# Patient Record
Sex: Male | Born: 1935
Health system: Southern US, Community
[De-identification: ages and names within clinical notes are randomized; demographics above are authoritative.]

## PROBLEM LIST (undated history)

## (undated) DIAGNOSIS — G250 Essential tremor: Secondary | ICD-10-CM

## (undated) DIAGNOSIS — N200 Calculus of kidney: Secondary | ICD-10-CM

## (undated) DIAGNOSIS — C801 Malignant (primary) neoplasm, unspecified: Secondary | ICD-10-CM

## (undated) DIAGNOSIS — E785 Hyperlipidemia, unspecified: Secondary | ICD-10-CM

## (undated) DIAGNOSIS — I10 Essential (primary) hypertension: Secondary | ICD-10-CM

## (undated) DIAGNOSIS — I252 Old myocardial infarction: Secondary | ICD-10-CM

## (undated) DIAGNOSIS — I6529 Occlusion and stenosis of unspecified carotid artery: Secondary | ICD-10-CM

## (undated) DIAGNOSIS — Z951 Presence of aortocoronary bypass graft: Secondary | ICD-10-CM

## (undated) DIAGNOSIS — M1990 Unspecified osteoarthritis, unspecified site: Secondary | ICD-10-CM

## (undated) DIAGNOSIS — I2510 Atherosclerotic heart disease of native coronary artery without angina pectoris: Secondary | ICD-10-CM

## (undated) HISTORY — DX: Occlusion and stenosis of unspecified carotid artery: I65.29

## (undated) HISTORY — DX: Essential (primary) hypertension: I10

## (undated) HISTORY — DX: Old myocardial infarction: I25.2

## (undated) HISTORY — DX: Hyperlipidemia, unspecified: E78.5

## (undated) HISTORY — DX: Calculus of kidney: N20.0

## (undated) HISTORY — DX: Essential tremor: G25.0

## (undated) HISTORY — DX: Presence of aortocoronary bypass graft: Z95.1

## (undated) HISTORY — PX: CORONARY STENT PLACEMENT: SHX1402

## (undated) HISTORY — DX: Atherosclerotic heart disease of native coronary artery without angina pectoris: I25.10

## (undated) HISTORY — DX: Unspecified osteoarthritis, unspecified site: M19.90

---

## 1984-07-19 HISTORY — PX: CORONARY ARTERY BYPASS GRAFT: SHX141

## 1984-08-29 DIAGNOSIS — Z951 Presence of aortocoronary bypass graft: Secondary | ICD-10-CM

## 1984-08-29 HISTORY — DX: Presence of aortocoronary bypass graft: Z95.1

## 1990-10-18 HISTORY — PX: CHOLECYSTECTOMY: SHX55

## 1999-01-16 ENCOUNTER — Observation Stay (HOSPITAL_COMMUNITY): Admission: AD | Admit: 1999-01-16 | Discharge: 1999-01-17 | Payer: Self-pay | Admitting: Cardiology

## 2002-01-18 ENCOUNTER — Emergency Department (HOSPITAL_COMMUNITY): Admission: EM | Admit: 2002-01-18 | Discharge: 2002-01-18 | Payer: Self-pay | Admitting: *Deleted

## 2005-12-04 ENCOUNTER — Inpatient Hospital Stay (HOSPITAL_COMMUNITY): Admission: EM | Admit: 2005-12-04 | Discharge: 2005-12-10 | Payer: Self-pay | Admitting: Emergency Medicine

## 2005-12-22 ENCOUNTER — Inpatient Hospital Stay (HOSPITAL_COMMUNITY): Admission: RE | Admit: 2005-12-22 | Discharge: 2005-12-24 | Payer: Self-pay | Admitting: Orthopedic Surgery

## 2008-12-27 HISTORY — PX: OTHER SURGICAL HISTORY: SHX169

## 2008-12-27 HISTORY — PX: US ECHOCARDIOGRAPHY: HXRAD669

## 2011-08-02 DIAGNOSIS — I10 Essential (primary) hypertension: Secondary | ICD-10-CM | POA: Diagnosis not present

## 2011-08-02 DIAGNOSIS — I2510 Atherosclerotic heart disease of native coronary artery without angina pectoris: Secondary | ICD-10-CM | POA: Diagnosis not present

## 2011-08-02 DIAGNOSIS — Z951 Presence of aortocoronary bypass graft: Secondary | ICD-10-CM | POA: Diagnosis not present

## 2011-08-04 DIAGNOSIS — M5020 Other cervical disc displacement, unspecified cervical region: Secondary | ICD-10-CM | POA: Diagnosis not present

## 2011-08-04 DIAGNOSIS — M542 Cervicalgia: Secondary | ICD-10-CM | POA: Diagnosis not present

## 2011-08-04 DIAGNOSIS — M25519 Pain in unspecified shoulder: Secondary | ICD-10-CM | POA: Diagnosis not present

## 2011-08-04 DIAGNOSIS — M79609 Pain in unspecified limb: Secondary | ICD-10-CM | POA: Diagnosis not present

## 2011-08-04 DIAGNOSIS — M5412 Radiculopathy, cervical region: Secondary | ICD-10-CM | POA: Diagnosis not present

## 2011-08-04 DIAGNOSIS — Z79899 Other long term (current) drug therapy: Secondary | ICD-10-CM | POA: Diagnosis not present

## 2011-08-04 DIAGNOSIS — M25539 Pain in unspecified wrist: Secondary | ICD-10-CM | POA: Diagnosis not present

## 2011-08-17 DIAGNOSIS — R059 Cough, unspecified: Secondary | ICD-10-CM | POA: Diagnosis not present

## 2011-08-17 DIAGNOSIS — R05 Cough: Secondary | ICD-10-CM | POA: Diagnosis not present

## 2011-08-17 DIAGNOSIS — J069 Acute upper respiratory infection, unspecified: Secondary | ICD-10-CM | POA: Diagnosis not present

## 2011-08-17 DIAGNOSIS — D681 Hereditary factor XI deficiency: Secondary | ICD-10-CM | POA: Diagnosis not present

## 2011-09-01 DIAGNOSIS — L570 Actinic keratosis: Secondary | ICD-10-CM | POA: Diagnosis not present

## 2011-09-02 DIAGNOSIS — E782 Mixed hyperlipidemia: Secondary | ICD-10-CM | POA: Diagnosis not present

## 2011-09-02 DIAGNOSIS — M542 Cervicalgia: Secondary | ICD-10-CM | POA: Diagnosis not present

## 2011-09-02 DIAGNOSIS — Z79899 Other long term (current) drug therapy: Secondary | ICD-10-CM | POA: Diagnosis not present

## 2011-09-02 DIAGNOSIS — M5412 Radiculopathy, cervical region: Secondary | ICD-10-CM | POA: Diagnosis not present

## 2011-10-04 DIAGNOSIS — G8929 Other chronic pain: Secondary | ICD-10-CM | POA: Diagnosis not present

## 2011-10-04 DIAGNOSIS — E039 Hypothyroidism, unspecified: Secondary | ICD-10-CM | POA: Diagnosis not present

## 2011-10-04 DIAGNOSIS — I25810 Atherosclerosis of coronary artery bypass graft(s) without angina pectoris: Secondary | ICD-10-CM | POA: Diagnosis not present

## 2011-10-04 DIAGNOSIS — Z79899 Other long term (current) drug therapy: Secondary | ICD-10-CM | POA: Diagnosis not present

## 2011-10-04 DIAGNOSIS — I10 Essential (primary) hypertension: Secondary | ICD-10-CM | POA: Diagnosis not present

## 2011-10-04 DIAGNOSIS — M542 Cervicalgia: Secondary | ICD-10-CM | POA: Diagnosis not present

## 2011-10-06 DIAGNOSIS — T148XXA Other injury of unspecified body region, initial encounter: Secondary | ICD-10-CM | POA: Diagnosis not present

## 2011-10-06 DIAGNOSIS — IMO0002 Reserved for concepts with insufficient information to code with codable children: Secondary | ICD-10-CM | POA: Diagnosis not present

## 2011-10-08 DIAGNOSIS — Z23 Encounter for immunization: Secondary | ICD-10-CM | POA: Diagnosis not present

## 2011-10-08 DIAGNOSIS — IMO0002 Reserved for concepts with insufficient information to code with codable children: Secondary | ICD-10-CM | POA: Diagnosis not present

## 2011-10-12 DIAGNOSIS — M542 Cervicalgia: Secondary | ICD-10-CM | POA: Diagnosis not present

## 2011-10-12 DIAGNOSIS — M5412 Radiculopathy, cervical region: Secondary | ICD-10-CM | POA: Diagnosis not present

## 2011-10-12 DIAGNOSIS — M5020 Other cervical disc displacement, unspecified cervical region: Secondary | ICD-10-CM | POA: Diagnosis not present

## 2011-10-15 DIAGNOSIS — IMO0002 Reserved for concepts with insufficient information to code with codable children: Secondary | ICD-10-CM | POA: Diagnosis not present

## 2011-10-20 DIAGNOSIS — M47812 Spondylosis without myelopathy or radiculopathy, cervical region: Secondary | ICD-10-CM | POA: Diagnosis not present

## 2011-10-20 DIAGNOSIS — Z79899 Other long term (current) drug therapy: Secondary | ICD-10-CM | POA: Diagnosis not present

## 2011-10-20 DIAGNOSIS — M5030 Other cervical disc degeneration, unspecified cervical region: Secondary | ICD-10-CM | POA: Diagnosis not present

## 2011-10-20 DIAGNOSIS — G894 Chronic pain syndrome: Secondary | ICD-10-CM | POA: Diagnosis not present

## 2011-10-20 DIAGNOSIS — M531 Cervicobrachial syndrome: Secondary | ICD-10-CM | POA: Diagnosis not present

## 2011-11-04 DIAGNOSIS — M542 Cervicalgia: Secondary | ICD-10-CM | POA: Diagnosis not present

## 2011-11-04 DIAGNOSIS — G5600 Carpal tunnel syndrome, unspecified upper limb: Secondary | ICD-10-CM | POA: Diagnosis not present

## 2011-11-30 DIAGNOSIS — M47812 Spondylosis without myelopathy or radiculopathy, cervical region: Secondary | ICD-10-CM | POA: Diagnosis not present

## 2011-11-30 DIAGNOSIS — G894 Chronic pain syndrome: Secondary | ICD-10-CM | POA: Diagnosis not present

## 2011-11-30 DIAGNOSIS — M5412 Radiculopathy, cervical region: Secondary | ICD-10-CM | POA: Diagnosis not present

## 2011-11-30 DIAGNOSIS — M5030 Other cervical disc degeneration, unspecified cervical region: Secondary | ICD-10-CM | POA: Diagnosis not present

## 2011-12-15 DIAGNOSIS — E782 Mixed hyperlipidemia: Secondary | ICD-10-CM | POA: Diagnosis not present

## 2011-12-15 DIAGNOSIS — I10 Essential (primary) hypertension: Secondary | ICD-10-CM | POA: Diagnosis not present

## 2011-12-15 DIAGNOSIS — I2510 Atherosclerotic heart disease of native coronary artery without angina pectoris: Secondary | ICD-10-CM | POA: Diagnosis not present

## 2011-12-15 DIAGNOSIS — I498 Other specified cardiac arrhythmias: Secondary | ICD-10-CM | POA: Diagnosis not present

## 2011-12-23 DIAGNOSIS — Z125 Encounter for screening for malignant neoplasm of prostate: Secondary | ICD-10-CM | POA: Diagnosis not present

## 2011-12-23 DIAGNOSIS — E782 Mixed hyperlipidemia: Secondary | ICD-10-CM | POA: Diagnosis not present

## 2011-12-23 DIAGNOSIS — Z79899 Other long term (current) drug therapy: Secondary | ICD-10-CM | POA: Diagnosis not present

## 2011-12-23 DIAGNOSIS — I10 Essential (primary) hypertension: Secondary | ICD-10-CM | POA: Diagnosis not present

## 2011-12-23 DIAGNOSIS — E559 Vitamin D deficiency, unspecified: Secondary | ICD-10-CM | POA: Diagnosis not present

## 2011-12-23 DIAGNOSIS — R972 Elevated prostate specific antigen [PSA]: Secondary | ICD-10-CM | POA: Diagnosis not present

## 2011-12-23 DIAGNOSIS — R7309 Other abnormal glucose: Secondary | ICD-10-CM | POA: Diagnosis not present

## 2012-02-21 DIAGNOSIS — E782 Mixed hyperlipidemia: Secondary | ICD-10-CM | POA: Diagnosis not present

## 2012-02-21 DIAGNOSIS — R7309 Other abnormal glucose: Secondary | ICD-10-CM | POA: Diagnosis not present

## 2012-02-21 DIAGNOSIS — I10 Essential (primary) hypertension: Secondary | ICD-10-CM | POA: Diagnosis not present

## 2012-03-27 DIAGNOSIS — I10 Essential (primary) hypertension: Secondary | ICD-10-CM | POA: Diagnosis not present

## 2012-03-27 DIAGNOSIS — Z79899 Other long term (current) drug therapy: Secondary | ICD-10-CM | POA: Diagnosis not present

## 2012-03-27 DIAGNOSIS — E782 Mixed hyperlipidemia: Secondary | ICD-10-CM | POA: Diagnosis not present

## 2012-03-27 DIAGNOSIS — R7309 Other abnormal glucose: Secondary | ICD-10-CM | POA: Diagnosis not present

## 2012-03-27 DIAGNOSIS — E559 Vitamin D deficiency, unspecified: Secondary | ICD-10-CM | POA: Diagnosis not present

## 2012-03-27 DIAGNOSIS — R972 Elevated prostate specific antigen [PSA]: Secondary | ICD-10-CM | POA: Diagnosis not present

## 2012-04-05 ENCOUNTER — Encounter (INDEPENDENT_AMBULATORY_CARE_PROVIDER_SITE_OTHER): Payer: Self-pay

## 2012-04-05 ENCOUNTER — Ambulatory Visit (INDEPENDENT_AMBULATORY_CARE_PROVIDER_SITE_OTHER): Payer: Medicare Other | Admitting: General Surgery

## 2012-04-19 ENCOUNTER — Encounter (INDEPENDENT_AMBULATORY_CARE_PROVIDER_SITE_OTHER): Payer: Self-pay | Admitting: General Surgery

## 2012-04-19 ENCOUNTER — Ambulatory Visit (INDEPENDENT_AMBULATORY_CARE_PROVIDER_SITE_OTHER): Payer: Medicare Other | Admitting: General Surgery

## 2012-04-19 VITALS — BP 134/68 | HR 64 | Temp 97.7°F | Resp 16 | Ht 66.5 in | Wt 164.2 lb

## 2012-04-19 DIAGNOSIS — K4090 Unilateral inguinal hernia, without obstruction or gangrene, not specified as recurrent: Secondary | ICD-10-CM | POA: Diagnosis not present

## 2012-04-19 HISTORY — DX: Unilateral inguinal hernia, without obstruction or gangrene, not specified as recurrent: K40.90

## 2012-04-19 MED ORDER — TAMSULOSIN HCL 0.4 MG PO CAPS: 0.4000 mg | ORAL_CAPSULE | Freq: Every day | ORAL | Status: DC

## 2012-04-19 NOTE — Patient Instructions (Addendum)
Pick up your prescription of flomax at your pharmacy - start taking 2 days before your surgery to decrease the chance of your bladder going to sleep during surgery  Hernia Repair with Laparoscope A hernia occurs when an internal organ pushes out through a weak spot in the belly (abdominal) wall muscles. Hernias most commonly occur in the groin and around the navel. Hernias can also occur through a cut by the surgeon (incision) after an abdominal operation. A hernia may be caused by:  Lifting heavy objects.  Prolonged coughing.  Straining to move your bowels. Hernias can often be pushed back into place (reduced). Most hernias tend to get worse over time. Problems occur when abdominal contents get stuck in the opening and the blood supply is blocked or impaired (incarcerated hernia). Because of these risks, you require surgery to repair the hernia. Your hernia will be repaired using a laparoscope. Laparoscopic surgery is a type of minimally invasive surgery. It does not involve making a typical surgical cut (incision) in the skin. A laparoscope is a telescope-like rod and lens system. It is usually connected to a video camera and a light source so your caregiver can clearly see the operative area. The instruments are inserted through  to  inch (5 mm or 10 mm) openings in the skin at specific locations. A working and viewing space is created by blowing a small amount of carbon dioxide gas into the abdominal cavity. The abdomen is essentially blown up like a balloon (insufflated). This elevates the abdominal wall above the internal organs like a dome. The carbon dioxide gas is common to the human body and can be absorbed by tissue and removed by the respiratory system. Once the repair is completed, the small incisions will be closed with either stitches (sutures) or staples (just like a paper stapler only this staple holds the skin together). LET YOUR CAREGIVERS KNOW ABOUT:  Allergies.  Medications  taken including herbs, eye drops, over the counter medications, and creams.  Use of steroids (by mouth or creams).  Previous problems with anesthetics or Novocaine.  Possibility of pregnancy, if this applies.  History of blood clots (thrombophlebitis).  History of bleeding or blood problems.  Previous surgery.  Other health problems. BEFORE THE PROCEDURE  Laparoscopy can be done either in a hospital or out-patient clinic. You may be given a mild sedative to help you relax before the procedure. Once in the operating room, you will be given a general anesthesia to make you sleep (unless you and your caregiver choose a different anesthetic).  AFTER THE PROCEDURE  After the procedure you will be watched in a recovery area. Depending on what type of hernia was repaired, you might be admitted to the hospital or you might go home the same day. With this procedure you may have less pain and scarring. This usually results in a quicker recovery and less risk of infection. HOME CARE INSTRUCTIONS   Bed rest is not required. You may continue your normal activities but avoid heavy lifting (more than 10 pounds) or straining.  Cough gently. If you are a smoker it is best to stop, as even the best hernia repair can break down with the continual strain of coughing.  Avoid driving until given the OK by your surgeon.  There are no dietary restrictions unless given otherwise.  TAKE ALL MEDICATIONS AS DIRECTED.  Only take over-the-counter or prescription medicines for pain, discomfort, or fever as directed by your caregiver. SEEK MEDICAL CARE IF:  There is increasing abdominal pain or pain in your incisions.  There is more bleeding from incisions, other than minimal spotting.  You feel light headed or faint.  You develop an unexplained fever, chills, and/or an oral temperature above 102 F (38.9 C).  You have redness, swelling, or increasing pain in the wound.  Pus coming from wound.  A  foul smell coming from the wound or dressings. SEEK IMMEDIATE MEDICAL CARE IF:   You develop a rash.  You have difficulty breathing.  You have any allergic problems. MAKE SURE YOU:   Understand these instructions.  Will watch your condition.  Will get help right away if you are not doing well or get worse. Document Released: 07/05/2005 Document Revised: 09/27/2011 Document Reviewed: 06/04/2009 [facility] Patient Information 2013 [place], [place].

## 2012-04-19 NOTE — Progress Notes (Signed)
Patient ID: Paul Sciara., male   DOB: 08-31-1932, 78 y.o.   MRN: [id number]  Chief Complaint  Patient presents with  . Pre-op Exam    eval RIH    HPI Paul Tinnon. is a 78 y.o. male.   HPI 78 yo WM referred by Dr Oneta Rack for evaluation of right inguinal hernia.  The patient states that he has noticed a bulge in his right groin for the past month. It does cause him some intermittent discomfort at times. He describes it as a pain. He really can't describe it with any additional adjectives. He denies any fever, chills, nausea, vomiting, diarrhea or constipation. He denies any pain with urination. He states that certain activities will make him have discomfort in his right groin. He denies any prior abdominal surgery. He does get up until the night to urinate and does complain of a weaker urinary stream.  Past Medical History  Diagnosis Date  . Arthritis   . Hypertension     Past Surgical History  Procedure Date  . Coronary artery bypass graft 1986    x5  . Coronary stent placement   . Cholecystectomy     Family History  Problem Relation Age of Onset  . Cancer Father     bone  . Cancer Brother     lymphnode    Social History History  Substance Use Topics  . Smoking status: Former Smoker    Start date: 07/19/1970  . Smokeless tobacco: Never Used  . Alcohol Use: No    Allergies  Allergen Reactions  . Erythromycin Nausea Only    Current Outpatient Prescriptions  Medication Sig Dispense Refill  . aspirin 81 MG tablet Take 81 mg by mouth daily.      [name] buPROPion (WELLBUTRIN XL) 300 MG 24 hr tablet Take 300 mg by mouth daily.      [name] lisinopril (PRINIVIL,ZESTRIL) 5 MG tablet Take 5 mg by mouth daily.      . magnesium citrate SOLN Take 1 Bottle by mouth once.      . metoprolol succinate (TOPROL-XL) 50 MG 24 hr tablet Take 50 mg by mouth daily. Take with or immediately following a meal.      . OVER THE COUNTER MEDICATION       . Tamsulosin HCl (FLOMAX) 0.4 MG CAPS  Take 1 capsule (0.4 mg total) by mouth daily.  10 capsule  0    Review of Systems Review of Systems  Constitutional: Negative for fever, chills, appetite change and unexpected weight change.  HENT: Negative for congestion and trouble swallowing.   Eyes: Negative for visual disturbance.  Respiratory: Negative for chest tightness and shortness of breath.   Cardiovascular: Negative for chest pain and leg swelling.       No PND, no orthopnea, no DOE  Gastrointestinal:       See HPI  Genitourinary: Negative for dysuria and hematuria.       Some nocturia, weak stream  Musculoskeletal: Negative.   Skin: Negative for rash.  Neurological: Positive for tremors. Negative for dizziness, seizures, syncope, facial asymmetry, speech difficulty and numbness.       Denies TIA, amaurosis fugax  Hematological: Does not bruise/bleed easily.  Psychiatric/Behavioral: Negative for behavioral problems and confusion.    Blood pressure 134/68, pulse 64, temperature 97.7 F (36.5 C), temperature source Temporal, resp. rate 16, height 5' 6.5" (1.689 m), weight 164 lb 3.2 oz (74.481 kg).  Physical Exam Physical Exam  Vitals reviewed. Constitutional:  He is oriented to person, place, and time. He appears well-developed and well-nourished. No distress.  HENT:  Head: Normocephalic and atraumatic.  Right Ear: External ear normal.  Left Ear: External ear normal.  Eyes: Conjunctivae normal are normal. No scleral icterus.  Neck: Normal range of motion. Neck supple. No tracheal deviation present. No thyromegaly present.  Cardiovascular: Normal rate, regular rhythm, normal heart sounds and intact distal pulses.   Pulmonary/Chest: Effort normal and breath sounds normal. No respiratory distress. He has no wheezes.  Abdominal: Soft. Bowel sounds are normal. He exhibits no distension. There is no tenderness. There is no rebound. A hernia is present. Hernia confirmed positive in the right inguinal area. Hernia confirmed  negative in the left inguinal area.  Genitourinary: Testes normal and penis normal. Right testis shows no mass. Left testis shows no mass.       Reducible RIH  Musculoskeletal: Normal range of motion. He exhibits no edema and no tenderness.  Lymphadenopathy:    He has no cervical adenopathy.  Neurological: He is alert and oriented to person, place, and time. He exhibits normal muscle tone.       Mild b/l UE tremor with arm movement (L>R)  Skin: Skin is warm and dry. No rash noted. He is not diaphoretic. No erythema. No pallor.  Psychiatric: He has a normal mood and affect. His behavior is normal. Judgment and thought content normal.    Data Reviewed Dr Kathryne Sharper note from 03/2012  Assessment    Right inguinal hernia    Plan    We discussed the etiology of inguinal hernias. We discussed the signs & symptoms of incarceration & strangulation.  We discussed non-operative and operative management.  The patient has elected to proceed with LAPAROSCOPIC REPAIR OF RIGHT INGUINAL HERNIA WITH MESH   I described the procedure in detail.  The patient was given educational material. We discussed the risks and benefits including but not limited to bleeding, infection, chronic inguinal pain, nerve entrapment, hernia recurrence, mesh complications, hematoma formation, urinary retention, injury to the testicles or the ovaries, numbness in the groin, blood clots, injury to the surrounding structures, and anesthesia risk. We also discussed the typical post operative recovery course, including no heavy lifting for 4-6 weeks. I explained that the likelihood of improvement of their symptoms is good.  We will place him on perioperative flomax to decrease his risk of urinary retention  Mary Sella. Andrey Campanile, MD, FACS General, Bariatric, & Minimally Invasive Surgery [facility], [place]         [facility] M 04/19/2012, 12:22 PM

## 2012-04-25 ENCOUNTER — Telehealth (INDEPENDENT_AMBULATORY_CARE_PROVIDER_SITE_OTHER): Payer: Self-pay | Admitting: General Surgery

## 2012-04-25 DIAGNOSIS — K4090 Unilateral inguinal hernia, without obstruction or gangrene, not specified as recurrent: Secondary | ICD-10-CM | POA: Diagnosis not present

## 2012-04-25 HISTORY — PX: INGUINAL HERNIA REPAIR: SUR1180

## 2012-04-25 NOTE — Telephone Encounter (Signed)
Called to advise of post op appointment made for the patient. Left message with patient's daughter who advised she would pass the information on to her mother. Post op set for 05/17/12 @8 :45.

## 2012-05-01 ENCOUNTER — Telehealth (INDEPENDENT_AMBULATORY_CARE_PROVIDER_SITE_OTHER): Payer: Self-pay

## 2012-05-01 NOTE — Telephone Encounter (Signed)
The patient was asking when he can drive after his surgery.  I told him about a week.  I said he must be off narcotics and be able to wear his seatbelt and slam on brakes if needed.

## 2012-05-08 ENCOUNTER — Telehealth (INDEPENDENT_AMBULATORY_CARE_PROVIDER_SITE_OTHER): Payer: Self-pay | Admitting: General Surgery

## 2012-05-08 NOTE — Telephone Encounter (Signed)
Pt called in to ask if he could drive.  I asked the patient if he was any longer taking any narcotics and he explained that he was not.  I also asked him if he felt like he had good range of motion and could stop suddenly if he needed to.  He informed he felt that he could.  I told him that he should be able to drive and that if it hurts than he shouldn't do it.  The patient said that he would like to hear this directly from Dr. Andrey Campanile.  I informed him that I would deliver this message to his nurse and that they would get back to him.

## 2012-05-10 NOTE — Telephone Encounter (Signed)
Per Dr Andrey Campanile, ok for patient to drive. Patient made aware.

## 2012-05-12 DIAGNOSIS — I10 Essential (primary) hypertension: Secondary | ICD-10-CM | POA: Diagnosis not present

## 2012-05-12 DIAGNOSIS — E039 Hypothyroidism, unspecified: Secondary | ICD-10-CM | POA: Diagnosis not present

## 2012-05-17 ENCOUNTER — Ambulatory Visit (INDEPENDENT_AMBULATORY_CARE_PROVIDER_SITE_OTHER): Payer: Medicare Other | Admitting: General Surgery

## 2012-05-17 ENCOUNTER — Encounter (INDEPENDENT_AMBULATORY_CARE_PROVIDER_SITE_OTHER): Payer: Self-pay | Admitting: General Surgery

## 2012-05-17 VITALS — BP 122/88 | HR 64 | Temp 97.0°F | Resp 16 | Ht 66.5 in | Wt 165.0 lb

## 2012-05-17 DIAGNOSIS — Z09 Encounter for follow-up examination after completed treatment for conditions other than malignant neoplasm: Secondary | ICD-10-CM

## 2012-05-17 MED ORDER — TAMSULOSIN HCL 0.4 MG PO CAPS: 0.4000 mg | ORAL_CAPSULE | Freq: Every day | ORAL | Status: DC

## 2012-05-17 NOTE — Progress Notes (Signed)
Subjective:     Patient ID: Paul Frazier., male   DOB: [birth year], 78 y.o.   MRN: [id number]  HPI 78 year old Caucasian male comes in for followup after undergoing a laparoscopic repair of a right indirect inguinal hernia with mesh on October 8. He states that he was sore for first few days after surgery. He denies any fevers or chills. He denies any nausea or vomiting. He denies any diarrhea or constipation. He is no longer taking any pain medication. He denies any pain in his groin. He denies any numbness or tingling in his groin. He states that he did notice a significant improvement in his urination when he was on Flomax perioperatively. He asked if he could have a refill on the Flomax  Review of Systems     Objective:   Physical Exam BP 122/88  Pulse 64  Temp 97 F (36.1 C) (Temporal)  Resp 16  Ht 5' 6.5" (1.689 m)  Wt 165 lb (74.844 kg)  BMI 26.23 kg/m2  Gen: alert, NAD, non-toxic appearing Abd: soft, nontender, nondistended. Well-healed trocar sites. No cellulitis. No incisional hernia GU: both testicles descended, no scrotal masses, no sign of hernia recurrence Ext: no edema     Assessment:     S/p laparoscopic repair of right indirect inguinal with mesh on 10/8    Plan:     He appears to be doing very well. I told him he could do light activity. I reminded him he should not lift anything greater than 15 pounds until the week of November 18. I gave him a prescription for Flomax. followup as needed.  Mary Sella. Andrey Campanile, MD, FACS General, Bariatric, & Minimally Invasive Surgery [facility], [place]

## 2012-05-17 NOTE — Patient Instructions (Signed)
Your flomax prescription was sent to your pharmacy Can resume full activities the week of 11/18

## 2012-06-13 DIAGNOSIS — Z79899 Other long term (current) drug therapy: Secondary | ICD-10-CM | POA: Diagnosis not present

## 2012-06-13 DIAGNOSIS — E039 Hypothyroidism, unspecified: Secondary | ICD-10-CM | POA: Diagnosis not present

## 2012-07-28 DIAGNOSIS — R972 Elevated prostate specific antigen [PSA]: Secondary | ICD-10-CM | POA: Diagnosis not present

## 2012-08-01 DIAGNOSIS — M999 Biomechanical lesion, unspecified: Secondary | ICD-10-CM | POA: Diagnosis not present

## 2012-08-01 DIAGNOSIS — M9981 Other biomechanical lesions of cervical region: Secondary | ICD-10-CM | POA: Diagnosis not present

## 2012-08-01 DIAGNOSIS — IMO0002 Reserved for concepts with insufficient information to code with codable children: Secondary | ICD-10-CM | POA: Diagnosis not present

## 2012-08-02 DIAGNOSIS — M9981 Other biomechanical lesions of cervical region: Secondary | ICD-10-CM | POA: Diagnosis not present

## 2012-08-02 DIAGNOSIS — IMO0002 Reserved for concepts with insufficient information to code with codable children: Secondary | ICD-10-CM | POA: Diagnosis not present

## 2012-08-02 DIAGNOSIS — M999 Biomechanical lesion, unspecified: Secondary | ICD-10-CM | POA: Diagnosis not present

## 2012-08-08 DIAGNOSIS — IMO0002 Reserved for concepts with insufficient information to code with codable children: Secondary | ICD-10-CM | POA: Diagnosis not present

## 2012-08-08 DIAGNOSIS — M999 Biomechanical lesion, unspecified: Secondary | ICD-10-CM | POA: Diagnosis not present

## 2012-08-08 DIAGNOSIS — M9981 Other biomechanical lesions of cervical region: Secondary | ICD-10-CM | POA: Diagnosis not present

## 2012-08-11 DIAGNOSIS — E782 Mixed hyperlipidemia: Secondary | ICD-10-CM | POA: Diagnosis not present

## 2012-08-11 DIAGNOSIS — Z951 Presence of aortocoronary bypass graft: Secondary | ICD-10-CM | POA: Diagnosis not present

## 2012-08-11 DIAGNOSIS — I10 Essential (primary) hypertension: Secondary | ICD-10-CM | POA: Diagnosis not present

## 2012-08-11 DIAGNOSIS — I2510 Atherosclerotic heart disease of native coronary artery without angina pectoris: Secondary | ICD-10-CM | POA: Diagnosis not present

## 2012-08-23 DIAGNOSIS — I10 Essential (primary) hypertension: Secondary | ICD-10-CM | POA: Diagnosis not present

## 2012-08-23 DIAGNOSIS — R7309 Other abnormal glucose: Secondary | ICD-10-CM | POA: Diagnosis not present

## 2012-08-23 DIAGNOSIS — R972 Elevated prostate specific antigen [PSA]: Secondary | ICD-10-CM | POA: Diagnosis not present

## 2012-08-23 DIAGNOSIS — E559 Vitamin D deficiency, unspecified: Secondary | ICD-10-CM | POA: Diagnosis not present

## 2012-08-23 DIAGNOSIS — Z79899 Other long term (current) drug therapy: Secondary | ICD-10-CM | POA: Diagnosis not present

## 2012-08-23 DIAGNOSIS — E782 Mixed hyperlipidemia: Secondary | ICD-10-CM | POA: Diagnosis not present

## 2012-08-24 DIAGNOSIS — G250 Essential tremor: Secondary | ICD-10-CM | POA: Diagnosis not present

## 2012-08-24 DIAGNOSIS — W19XXXA Unspecified fall, initial encounter: Secondary | ICD-10-CM | POA: Diagnosis not present

## 2012-08-24 DIAGNOSIS — G252 Other specified forms of tremor: Secondary | ICD-10-CM | POA: Diagnosis not present

## 2012-08-29 ENCOUNTER — Other Ambulatory Visit: Payer: Self-pay | Admitting: Neurology

## 2012-08-29 DIAGNOSIS — W19XXXA Unspecified fall, initial encounter: Secondary | ICD-10-CM

## 2012-08-29 DIAGNOSIS — G250 Essential tremor: Secondary | ICD-10-CM

## 2012-08-29 DIAGNOSIS — G252 Other specified forms of tremor: Secondary | ICD-10-CM

## 2012-09-06 ENCOUNTER — Ambulatory Visit
Admission: RE | Admit: 2012-09-06 | Discharge: 2012-09-06 | Disposition: A | Discharge status: Home / Self Care | Payer: Medicare Other | Source: Ambulatory Visit | Attending: Neurology | Admitting: Neurology

## 2012-09-06 DIAGNOSIS — W19XXXA Unspecified fall, initial encounter: Secondary | ICD-10-CM

## 2012-09-06 DIAGNOSIS — G250 Essential tremor: Secondary | ICD-10-CM

## 2012-09-06 DIAGNOSIS — I6200 Nontraumatic subdural hemorrhage, unspecified: Secondary | ICD-10-CM | POA: Diagnosis not present

## 2012-09-20 ENCOUNTER — Other Ambulatory Visit: Payer: Self-pay | Admitting: Neurology

## 2012-09-20 DIAGNOSIS — G250 Essential tremor: Secondary | ICD-10-CM

## 2012-09-20 DIAGNOSIS — W19XXXA Unspecified fall, initial encounter: Secondary | ICD-10-CM

## 2012-09-20 DIAGNOSIS — I6200 Nontraumatic subdural hemorrhage, unspecified: Secondary | ICD-10-CM

## 2012-10-05 ENCOUNTER — Ambulatory Visit
Admission: RE | Admit: 2012-10-05 | Discharge: 2012-10-05 | Disposition: A | Discharge status: Home / Self Care | Payer: Medicare Other | Source: Ambulatory Visit | Attending: Neurology | Admitting: Neurology

## 2012-10-05 DIAGNOSIS — I6200 Nontraumatic subdural hemorrhage, unspecified: Secondary | ICD-10-CM

## 2012-10-05 DIAGNOSIS — R259 Unspecified abnormal involuntary movements: Secondary | ICD-10-CM | POA: Diagnosis not present

## 2012-10-05 DIAGNOSIS — S065X0A Traumatic subdural hemorrhage without loss of consciousness, initial encounter: Secondary | ICD-10-CM | POA: Diagnosis not present

## 2012-10-05 DIAGNOSIS — G250 Essential tremor: Secondary | ICD-10-CM

## 2012-10-05 DIAGNOSIS — W19XXXA Unspecified fall, initial encounter: Secondary | ICD-10-CM

## 2012-10-11 NOTE — Progress Notes (Signed)
Quick Note:  Please call and advise the patient that the recent repeat head CT from 10/05/2012 again showed the left frontal subdural hematoma. Compared to the prior CT from 09/06/12, there is slight reduction in size of the subdural hematoma. If he is still doing ok, we can just see him back for his scheduled appointment later this year. Please advise him to call with any interim questions, concerns, problems or updates. If he has any new neurological symptoms especially new onset headaches or one-sided weakness, numbness or tingling he has to call 911 or go to the ER. All in all, the CT findings are stable or if anything improved from the first scan which is reassuring Thanks,  Huston Foley, MD, PhD  ______

## 2012-10-12 ENCOUNTER — Telehealth: Payer: Self-pay | Admitting: *Deleted

## 2012-10-12 NOTE — Telephone Encounter (Signed)
Spoke with the pt and his wife and explained the results of the CT with Dr Teofilo Pod instructions.  They were happy to get the results and there were no questions.

## 2012-10-12 NOTE — Telephone Encounter (Signed)
Message copied by Avie Echevaria on Thu Oct 12, 2012  3:24 PM ------      Message from: Huston Foley      Created: Wed Oct 11, 2012  8:51 AM       Please call and advise the patient that the recent repeat head CT from 10/05/2012 again showed the left frontal subdural hematoma. Compared to the prior CT from 09/06/12, there is slight      reduction in size of the subdural hematoma. If he is still doing ok, we can just see him back for his scheduled appointment later this year. Please advise him to call with any interim questions, concerns, problems or updates. If he has any new neurological symptoms especially new onset headaches or one-sided weakness, numbness or tingling he has to call 911 or go to the ER. All in all, the CT findings are stable or if anything improved from the first scan which is reassuring Thanks,       Huston Foley, MD, PhD       ------

## 2012-10-30 ENCOUNTER — Encounter: Payer: Self-pay | Admitting: *Deleted

## 2012-10-31 ENCOUNTER — Encounter: Payer: Self-pay | Admitting: Cardiovascular Disease

## 2012-11-20 DIAGNOSIS — Z951 Presence of aortocoronary bypass graft: Secondary | ICD-10-CM | POA: Diagnosis not present

## 2012-11-20 DIAGNOSIS — R002 Palpitations: Secondary | ICD-10-CM | POA: Diagnosis not present

## 2012-11-20 DIAGNOSIS — I447 Left bundle-branch block, unspecified: Secondary | ICD-10-CM | POA: Diagnosis not present

## 2012-11-20 DIAGNOSIS — I495 Sick sinus syndrome: Secondary | ICD-10-CM | POA: Diagnosis not present

## 2012-11-20 DIAGNOSIS — I2510 Atherosclerotic heart disease of native coronary artery without angina pectoris: Secondary | ICD-10-CM | POA: Diagnosis not present

## 2012-11-21 ENCOUNTER — Other Ambulatory Visit (HOSPITAL_COMMUNITY): Payer: Self-pay | Admitting: Cardiovascular Disease

## 2012-11-21 DIAGNOSIS — R079 Chest pain, unspecified: Secondary | ICD-10-CM

## 2012-11-21 DIAGNOSIS — R002 Palpitations: Secondary | ICD-10-CM | POA: Diagnosis not present

## 2012-12-01 ENCOUNTER — Ambulatory Visit (HOSPITAL_COMMUNITY)
Admission: RE | Admit: 2012-12-01 | Discharge: 2012-12-01 | Disposition: A | Discharge status: Home / Self Care | Payer: Medicare Other | Source: Ambulatory Visit | Attending: Cardiovascular Disease | Admitting: Cardiovascular Disease

## 2012-12-01 DIAGNOSIS — I252 Old myocardial infarction: Secondary | ICD-10-CM | POA: Diagnosis not present

## 2012-12-01 DIAGNOSIS — Z951 Presence of aortocoronary bypass graft: Secondary | ICD-10-CM | POA: Insufficient documentation

## 2012-12-01 DIAGNOSIS — E669 Obesity, unspecified: Secondary | ICD-10-CM | POA: Diagnosis not present

## 2012-12-01 DIAGNOSIS — R002 Palpitations: Secondary | ICD-10-CM | POA: Diagnosis not present

## 2012-12-01 DIAGNOSIS — R42 Dizziness and giddiness: Secondary | ICD-10-CM | POA: Insufficient documentation

## 2012-12-01 DIAGNOSIS — I2510 Atherosclerotic heart disease of native coronary artery without angina pectoris: Secondary | ICD-10-CM | POA: Diagnosis not present

## 2012-12-01 DIAGNOSIS — R079 Chest pain, unspecified: Secondary | ICD-10-CM | POA: Diagnosis not present

## 2012-12-01 DIAGNOSIS — I10 Essential (primary) hypertension: Secondary | ICD-10-CM | POA: Insufficient documentation

## 2012-12-01 MED ORDER — TECHNETIUM TC 99M SESTAMIBI GENERIC - CARDIOLITE
30.3000 | Freq: Once | INTRAVENOUS | Status: AC | PRN
  Administered 2012-12-01: 30.3 via INTRAVENOUS

## 2012-12-01 MED ORDER — REGADENOSON 0.4 MG/5ML IV SOLN
0.4000 mg | Freq: Once | INTRAVENOUS | Status: AC
  Administered 2012-12-01: 0.4 mg via INTRAVENOUS

## 2012-12-01 MED ORDER — TECHNETIUM TC 99M SESTAMIBI GENERIC - CARDIOLITE
11.0000 | Freq: Once | INTRAVENOUS | Status: AC | PRN
  Administered 2012-12-01: 11 via INTRAVENOUS

## 2012-12-01 NOTE — Procedures (Addendum)
[facility] [name] [place] [address] [phone]  Cardiology Nuclear Med Study  Paul Frazier. is a 79 y.o. male     MRN : [id number]     DOB: 1933-01-01  Procedure Date: 12/01/2012  Nuclear Med Background Indication for Stress Test:  Graft Patency, Stent Patency and PTCA Patency History:  CAD;MI--2012;CABG X5--08/29/1984;STENT--01/16/1999;PTCA--08/25/1984, 01/16/1999 AND 04/29/1999 Cardiac Risk Factors: Family History - CAD, History of Smoking, Hypertension, LBBB, Lipids and Overweight  Symptoms:  Chest Pain, Dizziness and Palpitations   Nuclear Pre-Procedure Caffeine/Decaff Intake:  10:00pm NPO After: 8:00am   IV Site: R Antecubital  IV 0.9% NS with Angio Cath:  22g  Chest Size (in):  40" IV Started by: Paul Pomfret, RN  Height: 5\' 6"  (1.676 m)  Cup Size: n/a  BMI:  Body mass index is 27.94 kg/(m^2). Weight:  173 lb (78.472 kg)   Tech Comments:  N/A    Nuclear Med Study 1 or 2 day study: 1 day  Stress Test Type:  Lexiscan  Order Authorizing Provider:  MIHAI CROITORU,MD   Resting Radionuclide: Technetium 6m Sestamibi  Resting Radionuclide Dose: 11.0 mCi   Stress Radionuclide:  Technetium 2m Sestamibi  Stress Radionuclide Dose: 30.3 mCi           Stress Protocol Rest HR:51 Stress HR: 69  Rest BP: 129/81 Stress BP: 157/81  Exercise Time (min): n/a METS: n/a          Dose of Adenosine (mg):  n/a Dose of Lexiscan: 0.4 mg  Dose of Atropine (mg): n/a Dose of Dobutamine: n/a mcg/kg/min (at max HR)  Stress Test Technologist: Paul Frazier, CCT Nuclear Technologist: Paul Frazier, CNMT   Rest Procedure:  Myocardial perfusion imaging was performed at rest 45 minutes following the intravenous administration of Technetium 60m Sestamibi. Stress Procedure:  The patient received IV Lexiscan 0.4 mg over 15-seconds.  Technetium 20m Sestamibi injected at 30-seconds. Due to patient's shortness of breath and  headache, he was given IV Aminophylline 75 mg. Symptoms were resolved.  There were no significant changes with Lexiscan.  Quantitative spect images were obtained after a 45 minute delay.  Transient Ischemic Dilatation (Normal <1.22):  1.05 Lung/Heart Ratio (Normal <0.45):  0.24 QGS EDV:  120 ml QGS ESV:  70 ml LV Ejection Fraction: 42%  Signed by       Rest ECG: NSR with IVCD  Stress ECG: No significant change from baseline ECG  QPS Raw Data Images:  Normal; no motion artifact; normal heart/lung ratio. Stress Images:  Normal homogeneous uptake in all areas of the myocardium. Rest Images:  Normal homogeneous uptake in all areas of the myocardium. Subtraction (SDS):  These findings are consistent with ischemia.  Impression Exercise Capacity:  Lexiscan with no exercise. BP Response:  Normal blood pressure response. Clinical Symptoms:  No significant symptoms noted. ECG Impression:  No significant ST segment change suggestive of ischemia. Comparison with Prior Nuclear Study: No previous nuclear study performed  Overall Impression:  Intermediate risk stress nuclear study There is anterolateral and inferolateral wall ischemia with mod LV dysfunction. The pt will follow up with Dr. Erlinda Frazier.  LV Wall Motion:  There is mod hypokinesis of the inferolateral wall with mod global decrease in LV function   Paul Gess, MD  12/01/2012 3:14 PM

## 2012-12-05 DIAGNOSIS — Z79899 Other long term (current) drug therapy: Secondary | ICD-10-CM | POA: Diagnosis not present

## 2012-12-05 DIAGNOSIS — R7309 Other abnormal glucose: Secondary | ICD-10-CM | POA: Diagnosis not present

## 2012-12-05 DIAGNOSIS — I10 Essential (primary) hypertension: Secondary | ICD-10-CM | POA: Diagnosis not present

## 2012-12-05 DIAGNOSIS — E782 Mixed hyperlipidemia: Secondary | ICD-10-CM | POA: Diagnosis not present

## 2012-12-05 DIAGNOSIS — D649 Anemia, unspecified: Secondary | ICD-10-CM | POA: Diagnosis not present

## 2012-12-06 NOTE — Progress Notes (Signed)
appt 01/01/13

## 2012-12-13 DIAGNOSIS — T07XXXA Unspecified multiple injuries, initial encounter: Secondary | ICD-10-CM | POA: Diagnosis not present

## 2013-01-01 ENCOUNTER — Ambulatory Visit: Payer: Medicare Other | Admitting: Cardiovascular Disease

## 2013-01-03 ENCOUNTER — Ambulatory Visit (INDEPENDENT_AMBULATORY_CARE_PROVIDER_SITE_OTHER): Payer: Medicare Other | Admitting: Cardiovascular Disease

## 2013-01-03 ENCOUNTER — Telehealth: Payer: Self-pay | Admitting: Cardiovascular Disease

## 2013-01-03 ENCOUNTER — Encounter: Payer: Self-pay | Admitting: Cardiovascular Disease

## 2013-01-03 VITALS — BP 128/74 | HR 64 | Resp 16 | Ht 66.0 in | Wt 173.6 lb

## 2013-01-03 DIAGNOSIS — I2589 Other forms of chronic ischemic heart disease: Secondary | ICD-10-CM | POA: Diagnosis not present

## 2013-01-03 DIAGNOSIS — I255 Ischemic cardiomyopathy: Secondary | ICD-10-CM

## 2013-01-03 DIAGNOSIS — I2510 Atherosclerotic heart disease of native coronary artery without angina pectoris: Secondary | ICD-10-CM | POA: Diagnosis not present

## 2013-01-03 DIAGNOSIS — E785 Hyperlipidemia, unspecified: Secondary | ICD-10-CM | POA: Diagnosis not present

## 2013-01-03 DIAGNOSIS — R5383 Other fatigue: Secondary | ICD-10-CM

## 2013-01-03 DIAGNOSIS — I447 Left bundle-branch block, unspecified: Secondary | ICD-10-CM | POA: Diagnosis not present

## 2013-01-03 DIAGNOSIS — R5381 Other malaise: Secondary | ICD-10-CM

## 2013-01-03 NOTE — Telephone Encounter (Signed)
Wants Dr C to know that he wants to wait to have Cath when he gets back!

## 2013-01-03 NOTE — Telephone Encounter (Signed)
Message forwarded to Dr. Croitoru.  

## 2013-01-03 NOTE — Patient Instructions (Addendum)
Your physician recommends that you schedule a follow-up appointment in: 3 weeks PLEASE GO TO THE EMERGENCY ROOM IF YOU ARE EXPERIENCING CHEST PAINS LASTING MORE THAN 20 MINUTES, AND/OR HAVING CP THAT IS UNRESOLVED AFTER 3 NITROGLYCERIN TABLETS.

## 2013-01-04 NOTE — Telephone Encounter (Signed)
OK 

## 2013-01-08 DIAGNOSIS — I255 Ischemic cardiomyopathy: Secondary | ICD-10-CM | POA: Insufficient documentation

## 2013-01-08 DIAGNOSIS — I447 Left bundle-branch block, unspecified: Secondary | ICD-10-CM | POA: Insufficient documentation

## 2013-01-08 DIAGNOSIS — E782 Mixed hyperlipidemia: Secondary | ICD-10-CM | POA: Insufficient documentation

## 2013-01-08 DIAGNOSIS — I2510 Atherosclerotic heart disease of native coronary artery without angina pectoris: Secondary | ICD-10-CM | POA: Insufficient documentation

## 2013-01-08 NOTE — Assessment & Plan Note (Addendum)
1984 - Myocardial infarction  1986 - CABG (LIMA to LAD, SVG to diagonal, SVG to OM, sequential SVG to PDA and PLA); total occlusion of the LAD and dominant left circumflex coronary artery, graft dependent 2000 stent to the saphenous vein graft to the oblique marginal artery 2012 acute myocardial infarction with inferolateral hypokinesis and loss of a saphenous vein graft (probably the SVG to the oblique marginal artery), EF 45%  He has an extensive and complicated past coronary history. Most recently he had an inferolateral myocardial infarction with occlusion of the saphenous vein graft to the lateral wall. His left circumflex coronary artery was described as being totally occluded. Over the last couple of years he has been asymptomatic until recently when he's developed exertional angina pectoris. His most recent nuclear stress test shows evidence of viable but ischemic myocardium in the territory of the left circumflex coronary artery. We have not investigated options for revascularization, since until recently he was asymptomatic. Have recommended that he undergo repeat coronary angiography. It is unlikely that we will be able to revascularize the territory of the oblique marginal artery. However I am concerned that his symptoms may signal a problem in the bypass graft to the posterolateral ventricular artery which was still patent at the time of his last cardiac catheterization which is now a 77 year old conduit.  He is skeptical. He clearly is not very willing to undergo invasive procedures. I have told him that as long as his symptoms are predictably brought on by physical activity and are relieved by rest there is no rush to perform the coronary angiogram, but I also told him that I am suspicious that he may have a new significant lesion. Since the native coronary artery is totally occluded we may have no options of revascularization unless we perform it before the saphenous vein graft closed down  completely.  If we go ahead with medical therapy only, consider adding Ranexa.

## 2013-01-08 NOTE — Assessment & Plan Note (Signed)
Does not have any clinical signs or symptoms of congestive heart failure. He is receiving appropriate therapy with beta blockers and ACE inhibitors. He does not require a loop diuretic therapy.

## 2013-01-08 NOTE — Assessment & Plan Note (Addendum)
This was possibly beta blocker related to his appointment in January and I recommended that he reduce the metoprolol to 25 mg once a day and instead take him out of 5 mg once a day. I'm not sure that he has made these changes. Now that we have the results of his myocardial perfusion study I am not sure that I want to cut back on his beta blocker

## 2013-01-08 NOTE — Progress Notes (Signed)
Patient ID: Paul Sciara., male   DOB: [birth year], 79 y.o.   MRN: [id number]      Reason for office visit Coronary artery disease follow up  The patient continues to experience exertional angina. The pattern does not appear to be much change from before but it does limit activity such as yard work. It occurs fairly predictably and is causing him only moderate concern. He denies problems with shortness of breath dizziness syncope focal cortical deficits or intermittent claudication.  He had a nuclear stress test earlier this year showed a fairly extensive area of inferolateral hypokinesis. This would fit with the distribution of the oblique marginal artery branch of the left circumflex is coronary artery. The saphenous vein graft bypass to this vessel likely occluded and causes myocardial infarction in 2012. He seems however to have extensive viable myocardium in this territory.    Allergies  Allergen Reactions  . Erythromycin Nausea Only    Current Outpatient Prescriptions  Medication Sig Dispense Refill  . aspirin 81 MG tablet Take 81 mg by mouth daily.      [name] buPROPion (WELLBUTRIN XL) 300 MG 24 hr tablet Take 300 mg by mouth daily.      . diazepam (VALIUM) 5 MG tablet Take 5 mg by mouth at bedtime as needed for anxiety.      . fish oil-omega-3 fatty acids 1000 MG capsule Take 1 g by mouth daily. Take 3 daily      . Pomegranate, Punica granatum, (POMEGRANATE PO) Take 1 tablet by mouth daily.      [name] Specialty Vitamins Products (PROSTATE) TABS Take 2 tablets by mouth.      [name] lisinopril (PRINIVIL,ZESTRIL) 5 MG tablet Take 5 mg by mouth daily.      . magnesium citrate SOLN Take 1 Bottle by mouth once.      . metoprolol succinate (TOPROL-XL) 50 MG 24 hr tablet Take 25 mg by mouth daily. Take with or immediately following a meal.      . Tamsulosin HCl (FLOMAX) 0.4 MG CAPS Take 1 capsule (0.4 mg total) by mouth daily.  30 capsule  1   No current facility-administered medications for  this visit.    Past Medical History  Diagnosis Date  . Arthritis   . Hypertension   . CAD (coronary artery disease)   . Old myocardial infarct   . S/P CABG x 5 08/29/1984    LIMA to LAD,SVG to diagonal,SVG to OM,SVG to PDA and PLA  . Tremor, essential   . Hyperlipidemia     Past Surgical History  Procedure Laterality Date  . Coronary artery bypass graft  1986    x5 LIMA to LAD,SVG to diagonal,SVG to OM,SVG to PDA and PLA  . Coronary stent placement    . Cholecystectomy  10/1990  . Inguinal hernia repair  04/25/2012    right- laparoscopic  . US echocardiography  12/27/2008    concentric LVH,mild to mod MR, TR and pulmonary hypertension. EF 45-50%  . R/p myoview  12/27/2008    no ischemia    Family History  Problem Relation Age of Onset  . Cancer Father     bone  . Cancer Brother     lymphnode    History   Social History  . Marital Status: Married    Spouse Name: N/A    Number of Children: N/A  . Years of Education: N/A   Occupational History  . Not on file.   Social History Main Topics  .  Smoking status: Former Smoker    Start date: 07/19/1970  . Smokeless tobacco: Never Used  . Alcohol Use: No  . Drug Use: No  . Sexually Active: Not on file   Other Topics Concern  . Not on file   Social History Narrative  . No narrative on file    Review of systems: CCS class II angina The patient specifically denies any chest pain at rest, dyspnea at rest or with exertion, orthopnea, paroxysmal nocturnal dyspnea, syncope, palpitations, focal neurological deficits, intermittent claudication, lower extremity edema, unexplained weight gain, cough, hemoptysis or wheezing.  The patient also denies abdominal pain, nausea, vomiting, dysphagia, diarrhea, constipation, polyuria, polydipsia, dysuria, hematuria, frequency, urgency, abnormal bleeding or bruising, fever, chills, unexpected weight changes, mood swings, change in skin or hair texture, change in voice quality, auditory  or visual problems, allergic reactions or rashes,  does have chronic problems with low back pain and tremor of the hands  PHYSICAL EXAM BP 128/74  Pulse 64  Resp 16  Ht 5\' 6"  (1.676 m)  Wt 78.744 kg (173 lb 9.6 oz)  BMI 28.03 kg/m2  General: Alert, oriented x3, no distress Head: no evidence of trauma, PERRL, EOMI, no exophtalmos or lid lag, no myxedema, no xanthelasma; normal ears, nose and oropharynx Neck: normal jugular venous pulsations and no hepatojugular reflux; brisk carotid pulses without delay and no carotid bruits Chest: clear to auscultation, no signs of consolidation by percussion or palpation, normal fremitus, symmetrical and full respiratory excursions, healed sternotomy scar Cardiovascular: normal position and quality of the apical impulse, regular rhythm, normal first and paradoxically split second heart sounds, no murmurs, rubs or gallops Abdomen: no tenderness or distention, no masses by palpation, no abnormal pulsatility or arterial bruits, normal bowel sounds, no hepatosplenomegaly Extremities: no clubbing, cyanosis or edema; 2+ radial, ulnar and brachial pulses bilaterally; 2+ right femoral, posterior tibial and dorsalis pedis pulses; 2+ left femoral, posterior tibial and dorsalis pedis pulses; no subclavian or femoral bruits Neurological: grossly nonfocal   EKG: Sinus rhythm left bundle branch block  Lipid Panel  October 2013 total cholesterol 177, triglycerides 138, HDL 51, LDL 98  BMET creatinine 1.02 TSH normal October thousand 13  ASSESSMENT AND PLAN CAD (coronary artery disease) 1984 - Myocardial infarction  1986 - CABG (LIMA to LAD, SVG to diagonal, SVG to OM, sequential SVG to PDA and PLA); total occlusion of the LAD and dominant left circumflex coronary artery, graft dependent 2000 stent to the saphenous vein graft to the oblique marginal artery 2012 acute myocardial infarction with inferolateral hypokinesis and loss of a saphenous vein graft (probably  the SVG to the oblique marginal artery), EF 45%  He has an extensive and complicated past coronary history. Most recently he had an inferolateral myocardial infarction with occlusion of the saphenous vein graft to the lateral wall. His left circumflex coronary artery was described as being totally occluded. Over the last couple of years he has been asymptomatic until recently when he's developed exertional angina pectoris. His most recent nuclear stress test shows evidence of viable but ischemic myocardium in the territory of the left circumflex coronary artery. We have not investigated options for revascularization, since until recently he was asymptomatic. Have recommended that he undergo repeat coronary angiography. It is unlikely that we will be able to revascularize the territory of the oblique marginal artery. However I am concerned that his symptoms may signal a problem in the bypass graft to the posterolateral ventricular artery which was still patent at the  time of his last cardiac catheterization which is now a 77 year old conduit.  He is skeptical. He clearly is not very willing to undergo invasive procedures. I have told him that as long as his symptoms are predictably brought on by physical activity and are relieved by rest there is no rush to perform the coronary angiogram, but I also told him that I am suspicious that he may have a new significant lesion. Since the native coronary artery is totally occluded we may have no options of revascularization unless we perform it before the saphenous vein graft closed down completely.  Cardiomyopathy, ischemic Does not have any clinical signs or symptoms of congestive heart failure. He is receiving appropriate therapy with beta blockers and ACE inhibitors. He does not require a loop diuretic therapy.  Dyslipidemia His most recent lipid profile showed a LDL level of 98 and a fairly good HDL of 51 he does not want to take statins. He is afraid of the  potential side effects.  Fatigue This was possibly beta blocker related to his appointment in January and I recommended that he reduce the metoprolol to 25 mg once a day and instead take him out of 5 mg once a day. I'm not sure that he has made these changes. Now that we have the results of his myocardial perfusion study I am not sure that I want to cut back on his beta blocker  No orders of the defined types were placed in this encounter.   Meds ordered this encounter  Medications  . Specialty Vitamins Products (PROSTATE) TABS    Sig: Take 2 tablets by mouth.  . Pomegranate, Punica granatum, (POMEGRANATE PO)    Sig: Take 1 tablet by mouth daily.    Junious Silk, MD, [facility] [phone] office [phone] pager

## 2013-01-08 NOTE — Assessment & Plan Note (Signed)
His most recent lipid profile showed a LDL level of 98 and a fairly good HDL of 51 he does not want to take statins. He is afraid of the potential side effects.

## 2013-01-25 DIAGNOSIS — R972 Elevated prostate specific antigen [PSA]: Secondary | ICD-10-CM | POA: Diagnosis not present

## 2013-01-26 DIAGNOSIS — R972 Elevated prostate specific antigen [PSA]: Secondary | ICD-10-CM | POA: Diagnosis not present

## 2013-02-01 ENCOUNTER — Encounter: Payer: Self-pay | Admitting: Cardiovascular Disease

## 2013-02-01 ENCOUNTER — Ambulatory Visit (INDEPENDENT_AMBULATORY_CARE_PROVIDER_SITE_OTHER): Payer: Medicare Other | Admitting: Cardiovascular Disease

## 2013-02-01 VITALS — BP 134/72 | HR 56 | Ht 66.5 in | Wt 171.1 lb

## 2013-02-01 DIAGNOSIS — I2589 Other forms of chronic ischemic heart disease: Secondary | ICD-10-CM | POA: Diagnosis not present

## 2013-02-01 DIAGNOSIS — I447 Left bundle-branch block, unspecified: Secondary | ICD-10-CM

## 2013-02-01 DIAGNOSIS — I255 Ischemic cardiomyopathy: Secondary | ICD-10-CM

## 2013-02-01 DIAGNOSIS — I2510 Atherosclerotic heart disease of native coronary artery without angina pectoris: Secondary | ICD-10-CM

## 2013-02-01 NOTE — Patient Instructions (Addendum)
Your physician recommends that you schedule a follow-up appointment in: 3 months  Continue current meds.

## 2013-02-02 NOTE — Assessment & Plan Note (Signed)
Mr. Paul Frazier extensive coronary artery disease and mild to moderate ischemic cardiomyopathy. He has exertional angina and what appears to be a fairly stable pattern. He has extensive myocardial ischemia involving the lateral wall on recent nuclear stress test. He is known to be graft dependent with total occlusion of the native LAD, circumflex and right coronary arteries. His most recent cardiac catheterization in 2012 showed that only the LIMA to the LAD and a sequential SVG to PDA and PLA remain patent, with interval occlusion of the SVG to diagonal and SVG to OM. A nuclear stress test performed in 2010 showed normal myocardial perfusion and the current described lateral wall ischemia is new. From a clinical and imaging standpoint I am not certain whether the current areas of ischemia can be entirely attributable to the known occluded grafts or whether he now also has stenosis in the distal limb of the SVG to PDA and PLA. There is definitely inferolateral ischemia. The only way to clarify the situation would be to perform another coronary angiogram. He is clearly very resistant to any new invasive interventions. He very firmly insists on medical management. As long as his symptoms maintain a stable, predictable pattern this is not an unreasonable approach, although scientific data would support an invasive strategy with the presence of ischemic myocardium by nuclear testing.

## 2013-02-02 NOTE — Assessment & Plan Note (Signed)
He has mildly depressed left ventricular systolic function with an ejection fraction of 45% by echo and 42% by nuclear scintigraphy, both studies demonstrating inferolateral hypokinesis. He is on appropriate treatment the maximum tolerated doses of ACE inhibitors and beta blockers and does not require loop diuretics. He has NYHA functional class II and appears clinically euvolemic

## 2013-02-02 NOTE — Assessment & Plan Note (Signed)
If he develops worsening heart failure, consideration might be given to CRT in the future.

## 2013-02-02 NOTE — Progress Notes (Signed)
Patient ID: Paul Frazier., male   DOB: Nov 03, 1932, 79 y.o.   MRN: [id number]     Reason for office visit CAD, ischemic cardiomyopathy, abnormal nuclear perfusion study.  Paul Frazier returns to discuss further management of his coronary problems. He has exertional angina pectoris CCS functional class 2-3. He tells me that since her last appointment his symptoms actually improved. He never has chest discomfort at rest. He has mild to moderately depressed left ventricle systolic function due to previous ischemic injury but has only mild shortness of breath on exertion. He is able to perform relatively intense yard work.  A recent nuclear stress test showed new areas of ischemia involving the anterolateral and inferolateral distribution of the left ventricular myocardium. These were not seen on 2010. He had bypass surgery with 5 bypass vessels about 30 years ago. By his cardiac catheterization in 2012 he is known to have total occlusion of the native coronary arteries (crack dependence). The only patent bypasses were the LIMA to the LAD and a sequential SVG to PDA and PLA. There was interval occlusion of SVG to diagonal and SVG to OM. There is some confusion regarding the innominate which are as vessels, with the [facility] cath report describing a bypass sequentially to the PDA and OM (I think this is a she PDA and PLA).  Because the new and fairly extensive area of ischemia was seen on the nuclear stress test I have recommended coronary angiography. He has had a few weeks to think about this but again declines. He clearly prefers noninvasive therapy and is a bit of a skeptic.    Allergies  Allergen Reactions  . Erythromycin Nausea Only    Current Outpatient Prescriptions  Medication Sig Dispense Refill  . aspirin 81 MG tablet Take 81 mg by mouth daily.      [name] buPROPion (WELLBUTRIN XL) 300 MG 24 hr tablet Take 300 mg by mouth daily.      . diazepam (VALIUM) 5 MG tablet Take 5 mg by mouth at  bedtime as needed for anxiety.      . fish oil-omega-3 fatty acids 1000 MG capsule Take 1 g by mouth daily. Take 3 daily      . lisinopril (PRINIVIL,ZESTRIL) 5 MG tablet Take 5 mg by mouth daily.      . magnesium citrate SOLN Take 1 Bottle by mouth once.      . metoprolol succinate (TOPROL-XL) 50 MG 24 hr tablet Take 25 mg by mouth daily. Take with or immediately following a meal.      . Pomegranate, Punica granatum, (POMEGRANATE PO) Take 1 tablet by mouth daily.      [name] Specialty Vitamins Products (PROSTATE) TABS Take 2 tablets by mouth.       No current facility-administered medications for this visit.    Past Medical History  Diagnosis Date  . Arthritis   . Hypertension   . CAD (coronary artery disease)   . Old myocardial infarct   . S/P CABG x 5 08/29/1984    LIMA to LAD,SVG to diagonal,SVG to OM,SVG to PDA and PLA  . Tremor, essential   . Hyperlipidemia     Past Surgical History  Procedure Laterality Date  . Coronary artery bypass graft  1986    x5 LIMA to LAD,SVG to diagonal,SVG to OM,SVG to PDA and PLA  . Coronary stent placement    . Cholecystectomy  10/1990  . Inguinal hernia repair  04/25/2012    right- laparoscopic  .  US echocardiography  12/27/2008    concentric LVH,mild to mod MR, TR and pulmonary hypertension. EF 45-50%  . R/p myoview  12/27/2008    no ischemia    Family History  Problem Relation Age of Onset  . Cancer Father     bone  . Cancer Brother     lymphnode    History   Social History  . Marital Status: Married    Spouse Name: N/A    Number of Children: N/A  . Years of Education: N/A   Occupational History  . Not on file.   Social History Main Topics  . Smoking status: Former Smoker    Start date: 07/19/1970  . Smokeless tobacco: Never Used  . Alcohol Use: No  . Drug Use: No  . Sexually Active: Not on file   Other Topics Concern  . Not on file   Social History Narrative  . No narrative on file    Review of systems: The patient  specifically denies any chest pain at rest, dyspnea at res, orthopnea, paroxysmal nocturnal dyspnea, syncope, palpitations, focal neurological deficits, intermittent claudication, lower extremity edema, unexplained weight gain, cough, hemoptysis or wheezing.  The patient also denies abdominal pain, nausea, vomiting, dysphagia, diarrhea, constipation, polyuria, polydipsia, dysuria, hematuria, frequency, urgency, abnormal bleeding or bruising, fever, chills, unexpected weight changes, mood swings, change in skin or hair texture, change in voice quality, auditory or visual problems, allergic reactions or rashes, new musculoskeletal complaints other than usual "aches and pains".   PHYSICAL EXAM BP 134/72  Pulse 56  Ht 5' 6.5" (1.689 m)  Wt 171 lb 1.6 oz (77.61 kg)  BMI 27.21 kg/m2 General: Alert, oriented x3, no distress  Head: no evidence of trauma, PERRL, EOMI, no exophtalmos or lid lag, no myxedema, no xanthelasma; normal ears, nose and oropharynx  Neck: normal jugular venous pulsations and no hepatojugular reflux; brisk carotid pulses without delay and no carotid bruits  Chest: clear to auscultation, no signs of consolidation by percussion or palpation, normal fremitus, symmetrical and full respiratory excursions, healed sternotomy scar  Cardiovascular: normal position and quality of the apical impulse, regular rhythm, normal first and paradoxically split second heart sounds, no murmurs, rubs or gallops  Abdomen: no tenderness or distention, no masses by palpation, no abnormal pulsatility or arterial bruits, normal bowel sounds, no hepatosplenomegaly  Extremities: no clubbing, cyanosis or edema; 2+ radial, ulnar and brachial pulses bilaterally; 2+ right femoral, posterior tibial and dorsalis pedis pulses; 2+ left femoral, posterior tibial and dorsalis pedis pulses; no subclavian or femoral bruits  Neurological: grossly nonfocal   EKG: Sinus rhythm left bundle branch block  Lipid Panel  No  results found for this basename: chol, trig, hdl, cholhdl, vldl, ldlcalc    BMET No results found for this basename: na, k, cl, co2, glucose, bun, creatinine, calcium, gfrnonaa, gfraa     ASSESSMENT AND PLAN CAD (coronary artery disease) Mr. Hillery extensive coronary artery disease and mild to moderate ischemic cardiomyopathy. He has exertional angina and what appears to be a fairly stable pattern. He has extensive myocardial ischemia involving the lateral wall on recent nuclear stress test. He is known to be graft dependent with total occlusion of the native LAD, circumflex and right coronary arteries. His most recent cardiac catheterization in 2012 showed that only the LIMA to the LAD and a sequential SVG to PDA and PLA remain patent, with interval occlusion of the SVG to diagonal and SVG to OM. A nuclear stress test performed in 2010  showed normal myocardial perfusion and the current described lateral wall ischemia is new. From a clinical and imaging standpoint I am not certain whether the current areas of ischemia can be entirely attributable to the known occluded grafts or whether he now also has stenosis in the distal limb of the SVG to PDA and PLA. There is definitely inferolateral ischemia. The only way to clarify the situation would be to perform another coronary angiogram. He is clearly very resistant to any new invasive interventions. He very firmly insists on medical management. As long as his symptoms maintain a stable, predictable pattern this is not an unreasonable approach, although scientific data would support an invasive strategy with the presence of ischemic myocardium by nuclear testing.  Cardiomyopathy, ischemic He has mildly depressed left ventricular systolic function with an ejection fraction of 45% by echo and 42% by nuclear scintigraphy, both studies demonstrating inferolateral hypokinesis. He is on appropriate treatment the maximum tolerated doses of ACE inhibitors and beta  blockers and does not require loop diuretics. He has NYHA functional class II and appears clinically euvolemic  LBBB (left bundle branch block) If he develops worsening heart failure, consideration might be given to CRT in the future.   Junious Silk, MD, [facility] [phone] office [phone] pager

## 2013-02-08 ENCOUNTER — Telehealth: Payer: Self-pay | Admitting: Neurology

## 2013-02-08 NOTE — Telephone Encounter (Signed)
Spk w/pt wants to be on waiting list he can't wait till Sept. Needs to be seen as soon as possible. Can we find another time with Dr. Frances Furbish sooner than Sept. Please call pt with a sooner apt. Thanks

## 2013-02-08 NOTE — Telephone Encounter (Signed)
Returned call. No answer.  

## 2013-02-12 ENCOUNTER — Ambulatory Visit: Payer: Medicare Other | Admitting: Neurology

## 2013-02-21 ENCOUNTER — Ambulatory Visit: Payer: Medicare Other | Admitting: Neurology

## 2013-02-22 DIAGNOSIS — M79609 Pain in unspecified limb: Secondary | ICD-10-CM | POA: Diagnosis not present

## 2013-02-22 DIAGNOSIS — M542 Cervicalgia: Secondary | ICD-10-CM | POA: Diagnosis not present

## 2013-03-07 DIAGNOSIS — R5381 Other malaise: Secondary | ICD-10-CM | POA: Diagnosis not present

## 2013-03-07 DIAGNOSIS — R7309 Other abnormal glucose: Secondary | ICD-10-CM | POA: Diagnosis not present

## 2013-03-07 DIAGNOSIS — E538 Deficiency of other specified B group vitamins: Secondary | ICD-10-CM | POA: Diagnosis not present

## 2013-03-07 DIAGNOSIS — Z79899 Other long term (current) drug therapy: Secondary | ICD-10-CM | POA: Diagnosis not present

## 2013-03-07 DIAGNOSIS — I10 Essential (primary) hypertension: Secondary | ICD-10-CM | POA: Diagnosis not present

## 2013-03-07 DIAGNOSIS — E782 Mixed hyperlipidemia: Secondary | ICD-10-CM | POA: Diagnosis not present

## 2013-03-07 DIAGNOSIS — E559 Vitamin D deficiency, unspecified: Secondary | ICD-10-CM | POA: Diagnosis not present

## 2013-03-07 DIAGNOSIS — D649 Anemia, unspecified: Secondary | ICD-10-CM | POA: Diagnosis not present

## 2013-03-26 IMAGING — CT CT HEAD W/O CM
2 series · 16 of 30 positions shown, 20 images · non-contrast
Comparison: none

[Series 3: head bone · axial · 0.49mm/px · z∈[+40,+82]mm · 3 of 28 slices shown]
[im 2/28  bone]
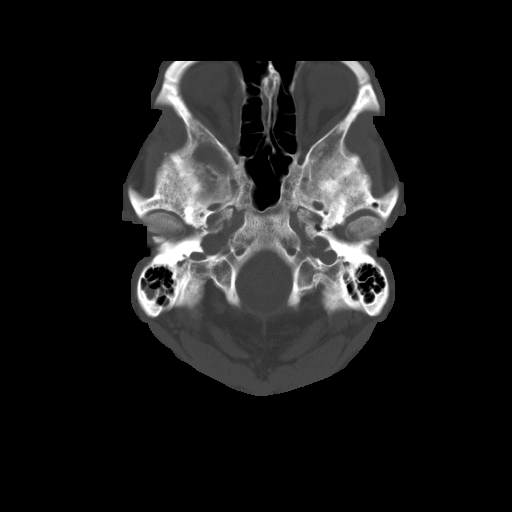
[im 6/28  bone]
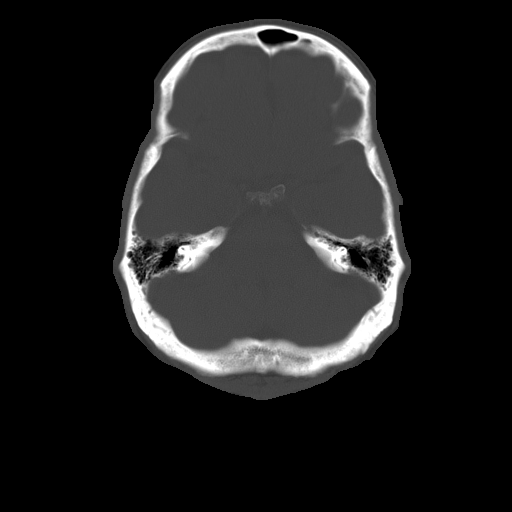
[im 10/28  bone]
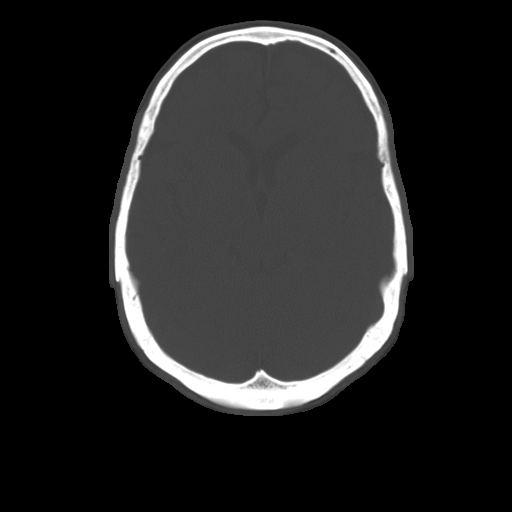

[Series 32: 3d filtered head w/o · axial · non-contrast · 0.49mm/px · z∈[+40,+164]mm · 13 of 28 slices shown, 17 images]
[im 2/28  brain]
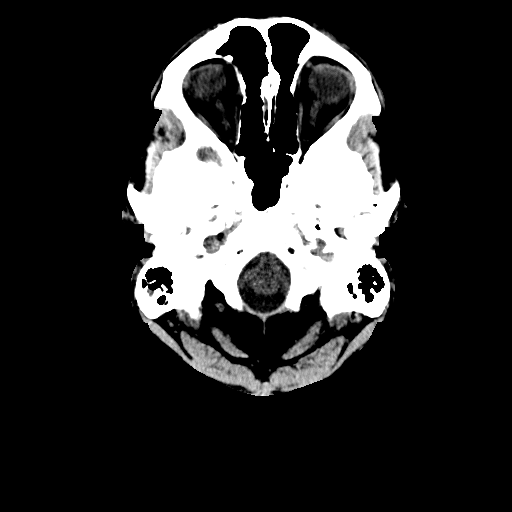
[im 2/28  bone]
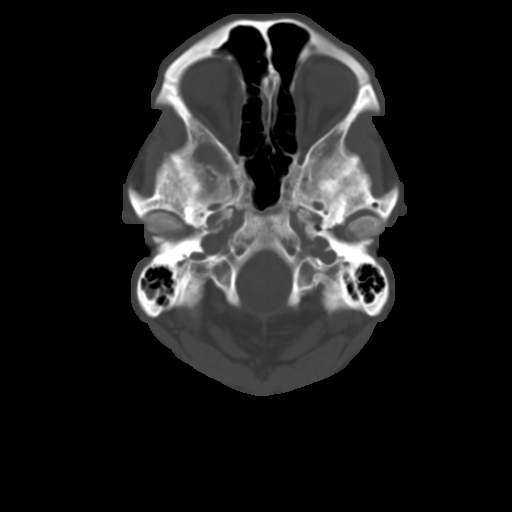
[im 4/28  brain]
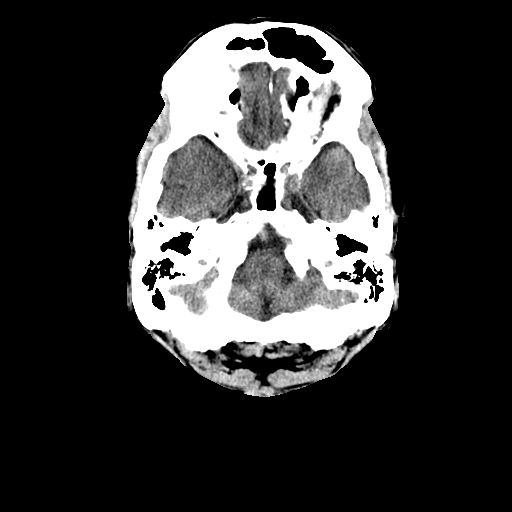
[im 6/28  brain]
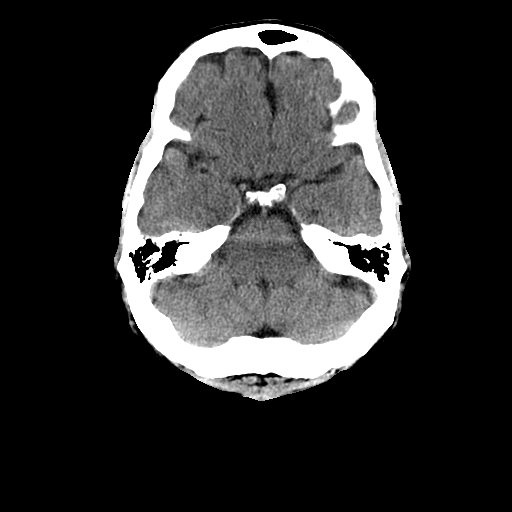
[im 8/28  brain]
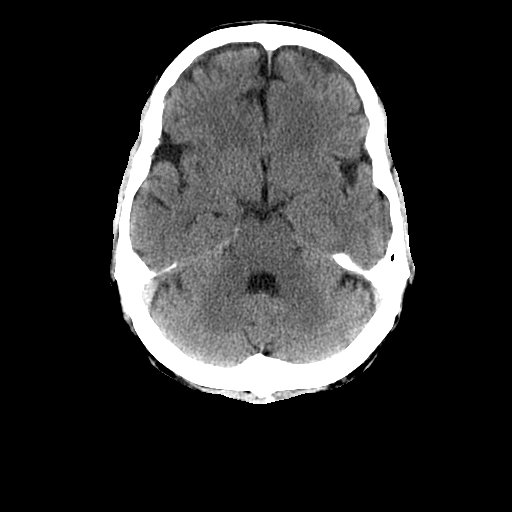
[im 10/28  brain]
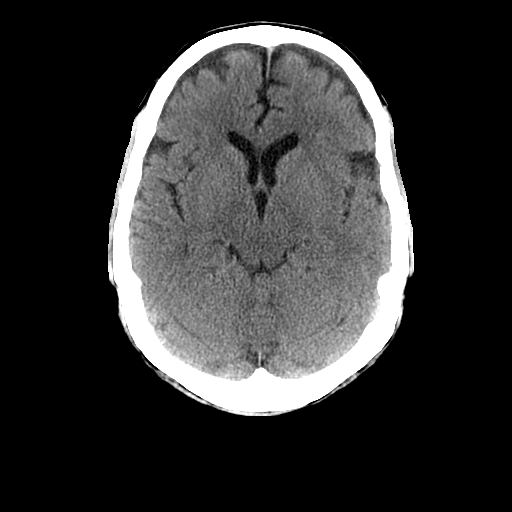
[im 10/28  bone]
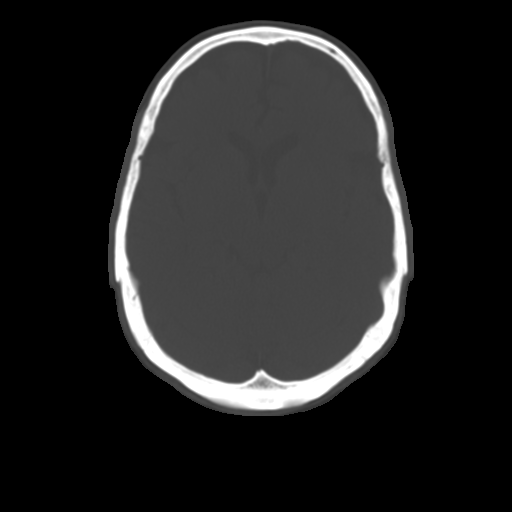
[im 12/28  brain]
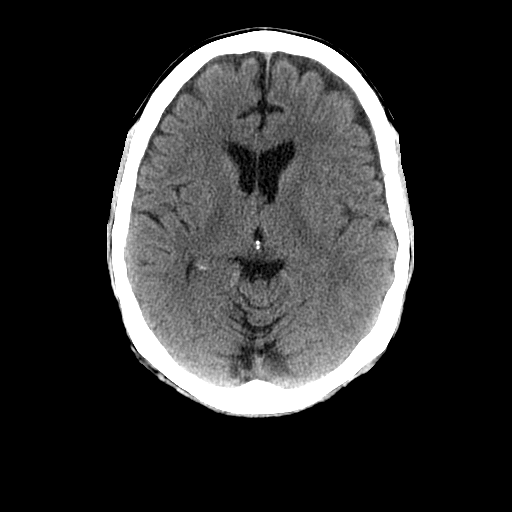
[im 14/28  brain]
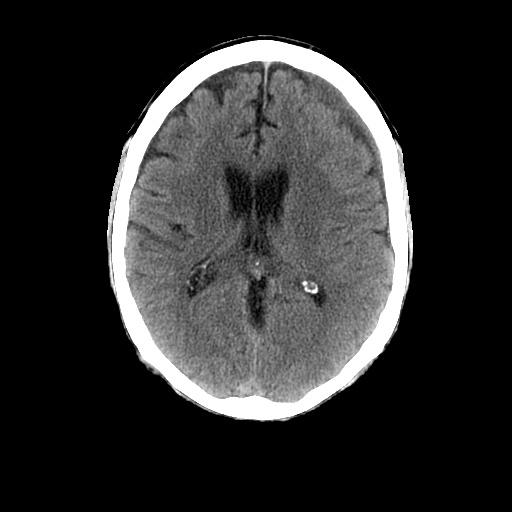
[im 16/28  brain]
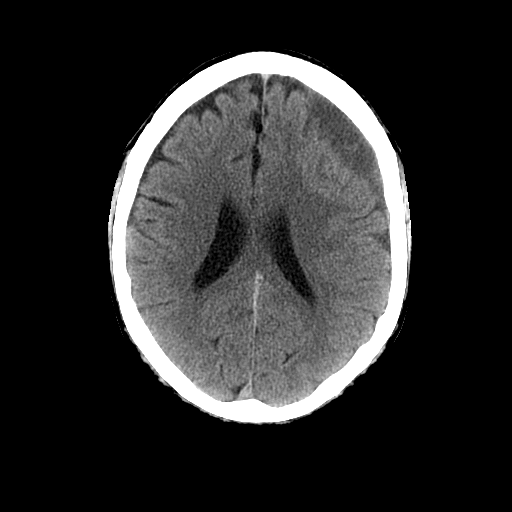
[im 18/28  brain]
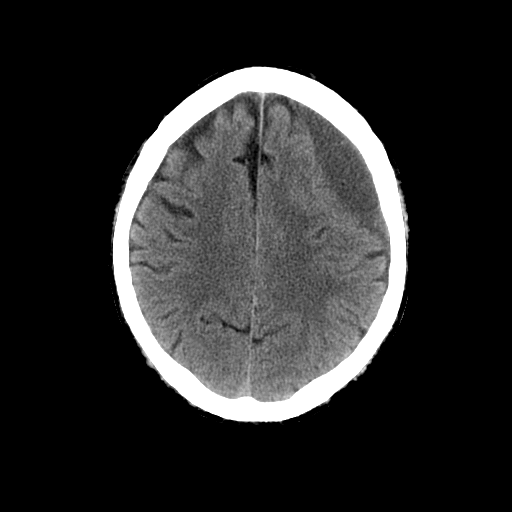
[im 18/28  bone]
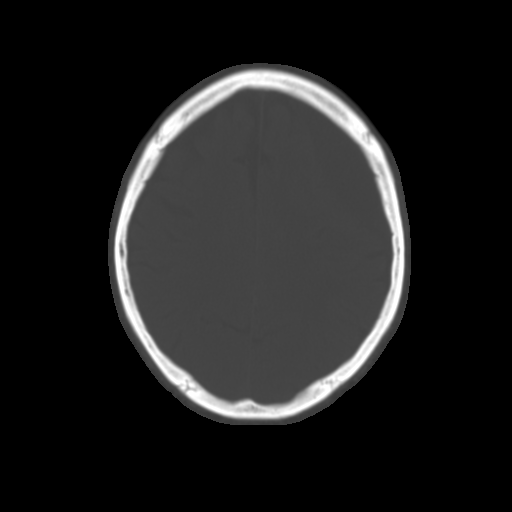
[im 20/28  brain]
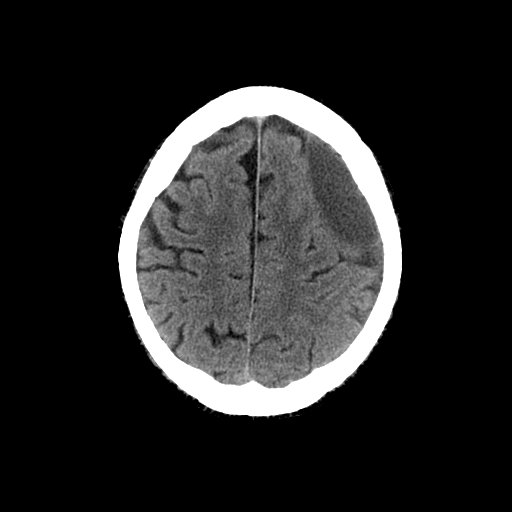
[im 22/28  brain]
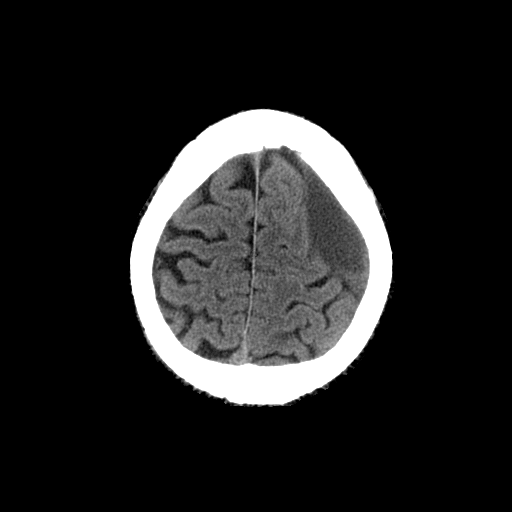
[im 24/28  brain]
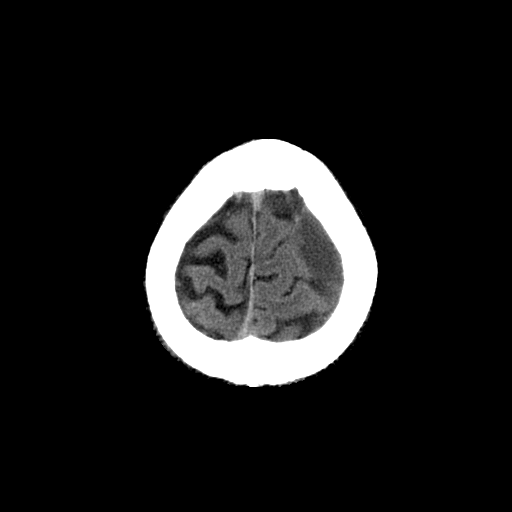
[im 26/28  brain]
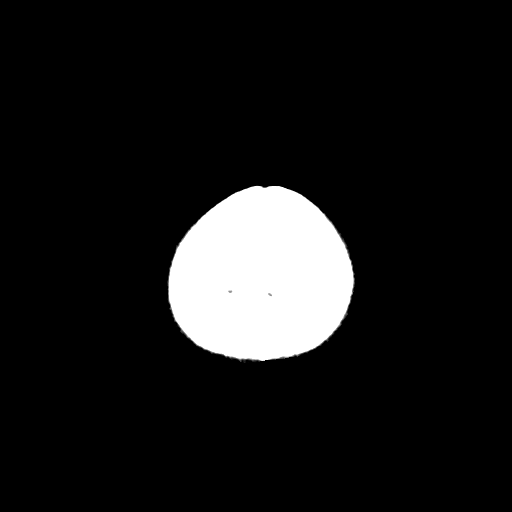
[im 26/28  bone]
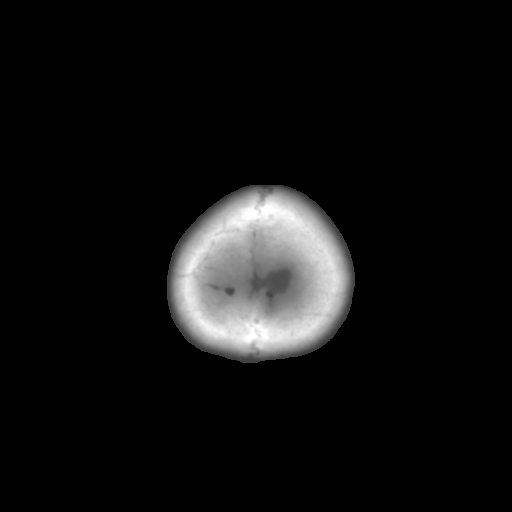

[16 of 30 positions shown; findings below may reference images not displayed]

This examination was performed at [HOSPITAL] at [HOSPITAL]
[HOSPITAL]. The interpretation will be provided by [REDACTED]

## 2013-04-02 ENCOUNTER — Telehealth: Payer: Self-pay | Admitting: Cardiovascular Disease

## 2013-04-02 NOTE — Telephone Encounter (Signed)
Pt is returning your call

## 2013-04-03 NOTE — Telephone Encounter (Signed)
Patient states someone called this AM but did not leave a name.  There is no documentation as to who called.  Will call patient back if this is determined.

## 2013-04-03 NOTE — Telephone Encounter (Signed)
No call from triage.  Forwarded to B. Lassiter, CMA.

## 2013-04-03 NOTE — Telephone Encounter (Signed)
Returning your call. °

## 2013-04-30 DIAGNOSIS — H524 Presbyopia: Secondary | ICD-10-CM | POA: Diagnosis not present

## 2013-04-30 DIAGNOSIS — Z961 Presence of intraocular lens: Secondary | ICD-10-CM | POA: Diagnosis not present

## 2013-05-07 ENCOUNTER — Ambulatory Visit: Payer: Medicare Other | Admitting: Cardiovascular Disease

## 2013-05-14 ENCOUNTER — Encounter (INDEPENDENT_AMBULATORY_CARE_PROVIDER_SITE_OTHER): Payer: Self-pay

## 2013-05-14 ENCOUNTER — Encounter: Payer: Self-pay | Admitting: Neurology

## 2013-05-14 ENCOUNTER — Ambulatory Visit (INDEPENDENT_AMBULATORY_CARE_PROVIDER_SITE_OTHER): Payer: Medicare Other | Admitting: Neurology

## 2013-05-14 VITALS — BP 146/75 | HR 52 | Temp 98.0°F | Ht 66.0 in | Wt 171.0 lb

## 2013-05-14 DIAGNOSIS — G250 Essential tremor: Secondary | ICD-10-CM | POA: Diagnosis not present

## 2013-05-14 DIAGNOSIS — Z951 Presence of aortocoronary bypass graft: Secondary | ICD-10-CM

## 2013-05-14 DIAGNOSIS — Z820 Family history of epilepsy and other diseases of the nervous system: Secondary | ICD-10-CM | POA: Diagnosis not present

## 2013-05-14 DIAGNOSIS — I252 Old myocardial infarction: Secondary | ICD-10-CM | POA: Diagnosis not present

## 2013-05-14 NOTE — Patient Instructions (Signed)
  Please remember, that any kind of tremor may be exacerbated by anxiety, anger, nervousness, excitement, dehydration, sleep deprivation, by caffeine, and low blood sugar values or blood sugar fluctuations. Some medications, especially some antidepressants and lithium can cause or exacerbate tremors. Tremors may temporarily calm down her subside with the use of a benzodiazepine such as Valium or related medications and with alcohol. Be aware however that drinking alcohol is not an approved treatment or appropriate treatment for tremor control and long-term use of benzodiazepines such as Valium, lorazepam, alprazolam, or clonazepam can cause habit formation, physical and psychological addiction.  Change positions slowly and keep well hydrated.   Please avoid climbing on ladders.

## 2013-05-14 NOTE — Progress Notes (Signed)
Subjective:    Patient ID: Paul Ose. is a 79 y.o. male.  HPI  Interim history:   Paul Frazier is a very friendly 79 year old right-handed gentleman who has an underlying diagnosis of CAD, HTN, HLP, who presents for followup consultation of his essential tremor. He is accompanied by his wife again today. I first met him on 08/24/2012, which time he reported that he fell in December and hit his head. He was already on low-dose beta blocker because of his heart and I did not suggest that he increase it because his heart rate was already in the 50s. We talked about adding another medication down the Road but I did not suggest that we start him just yet. I did suggest a head CT based on his history of fall. He had a head CT on 09/06/2012 which showed a left frontal chronic subdural hematoma with mild midline shift but because he had moderate frontal atrophy there was not a whole other midline shift. I suggested that we repeat his head CT and we called him with his test results. He had a repeat head CT on 10/05/2012: Chronic left frontal subdural hematoma. Mild mass effect upon underlying frontal lobe. Minimal 2 mm of left to right midline shift. Compared to the prior CT from 09/06/12, there is slight reduction in size of the subdural hematoma. He and his wife noted that his tremors have become worse since he had a heart attack last year. He has a prior history of coronary artery bypass surgery in the 80s and also had a stent placed in 2001. He has not noticed much in the way of difficulty with his posture. He does notice sometimes that his balance is not very good. Of note he has fallen recently before Christmas last year. He actually tripped over a piece of wire that was outside and landed on the ground which was cold and frozen at the time. He fell and hit his for head. He has not had a head CT. He reports no residual headaches are one-sided weakness, numbness, slurring of speech or droopy face.  Nevertheless at the time he had a severe headache. He's had degenerative spine disease and has had epidural injections to his neck. He's had some numbness and tingling radiating to his left index finger. He has seen a neurosurgeon who suggested surgery but left it up to him to decide. He has had epidural injection had his last injection in February 2012.  He has had some lightheadedness, and recently was told to stop his Prinivil. He has not fallen, but has had some close calls. He denies vertigo. Never had TIA or stroke symptoms, denying sudden onset of one sided weakness, numbness, tingling, slurring of speech or droopy face, hearing loss, tinnitus, diplopia or visual field cut or monocular loss of vision, and denies recurrent headaches.   His tremor affects both hands but is worse on the left. It is primarily a action tremor and his handwriting is impaired. 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 He was very active in his work and had to stop suddenly because of his heart attack. He felt even if he had not stop working because of his heart he would've stopped because of his trembling. He has not noticed a lower extremity tremor. He does not report any stiffness or drooling. He does not report any memory difficulties or other cognitive impairment. He tries to walk regularly and his wife has not noticed any changes in his gait.  He had a brother who passed away at age 32, from cancer in his lymph nodes and he had PD. He recalls no other FHx of tremors.   His Past Medical History Is Significant For: Past Medical History  Diagnosis Date  . Arthritis   . Hypertension   . CAD (coronary artery disease)   . Old myocardial infarct   . S/P CABG x 5 08/29/1984    LIMA to LAD,SVG to diagonal,SVG to OM,SVG to PDA and PLA  . Tremor, essential   . Hyperlipidemia     His Past Surgical History Is Significant For: Past Surgical History  Procedure Laterality Date  . Coronary artery bypass graft  1986     x5 LIMA to LAD,SVG to diagonal,SVG to OM,SVG to PDA and PLA  . Coronary stent placement    . Cholecystectomy  10/1990  . Inguinal hernia repair  04/25/2012    right- laparoscopic  . US echocardiography  12/27/2008    concentric LVH,mild to mod MR, TR and pulmonary hypertension. EF 45-50%  . R/p myoview  12/27/2008    no ischemia    His Family History Is Significant For: Family History  Problem Relation Age of Onset  . Cancer Father     bone  . Cancer Brother     lymphnode    His Social History Is Significant For: History   Social History  . Marital Status: Married    Spouse Name: N/A    Number of Children: N/A  . Years of Education: N/A   Social History Main Topics  . Smoking status: Former Smoker    Start date: 07/19/1970  . Smokeless tobacco: Never Used  . Alcohol Use: No  . Drug Use: No  . Sexual Activity: None   Other Topics Concern  . None   Social History Narrative  . None    His Allergies Are:  Allergies  Allergen Reactions  . Erythromycin Nausea Only  :   His Current Medications Are:  Outpatient Encounter Prescriptions as of 05/14/2013  Medication Sig Dispense Refill  . aspirin 81 MG tablet Take 81 mg by mouth daily.      [name] buPROPion (WELLBUTRIN XL) 300 MG 24 hr tablet Take 300 mg by mouth daily.      . diazepam (VALIUM) 5 MG tablet Take 5 mg by mouth at bedtime as needed for anxiety.      . fish oil-omega-3 fatty acids 1000 MG capsule Take 1 g by mouth daily. Take 3 daily      . magnesium citrate SOLN Take 1 Bottle by mouth once.      . metoprolol succinate (TOPROL-XL) 50 MG 24 hr tablet Take 25 mg by mouth daily. Take with or immediately following a meal.      . Pomegranate, Punica granatum, (POMEGRANATE PO) Take 1 tablet by mouth daily.      [name] Specialty Vitamins Products (PROSTATE) TABS Take 2 tablets by mouth.      . [DISCONTINUED] lisinopril (PRINIVIL,ZESTRIL) 5 MG tablet Take 5 mg by mouth daily.       No facility-administered encounter  medications on file as of 05/14/2013.  :  Review of Systems:  Out of a complete 14 point review of systems, all are reviewed and negative with the exception of these symptoms as listed below:   Review of Systems  Constitutional: Negative.   HENT: Negative.   Eyes: Negative.   Respiratory: Negative.   Cardiovascular: Negative.   Gastrointestinal: Negative.   Endocrine: Negative.  Genitourinary: Negative.   Musculoskeletal: Negative.   Skin: Negative.   Allergic/Immunologic: Negative.   Neurological: Positive for dizziness.  Hematological: Negative.   Psychiatric/Behavioral: Negative.     Objective:  Neurologic Exam  Physical Exam Physical Examination:   Filed Vitals:   05/14/13 1158  BP: 146/75  Pulse: 52  Temp: 98 F (36.7 C)    General Examination: The patient is a very pleasant 79 y.o. male in no acute distress. He appears well-developed and well-nourished and well groomed.   HEENT exam: Normocephalic, atraumatic, pupils are equal, round and reactive to light and accommodation. Extraocular tracking is good without nystagmus. Hearing is intact. Neck is supple with full range of motion. He has no lip, or jaw tremor, but he has a head tremor, which is new. Oropharynx is clear with a mildly narrow appearing airway. Tonsils are absent. Tongue protrudes centrally and palate elevates symmetrically. Chest is clear to auscultation without any wheezing or rhonchi. Heart sounds are normal without murmurs, rubs or gallops. Abdomen is soft, nontender with normal bowel sounds appreciated. He has no pitting edema in the distal lower extremities bilaterally. He has some scars from where his vessels were harvested in his distal legs. Neurologically: Mental status: The patient is awake, alert and oriented in all 3 spheres. His memory, attention, language and knowledge are appropriate. Cranial nerves II through XII are as described under HEENT exam. In addition, shoulder strength is good.  Motor exam: Normal bulk, strength and tone is noted. He has a subtle intermittent resting tremor in the right upper extremity and a mild intermittent resting tremor in the left upper extremity, he has a mild postural tremor in both upper extremities. He has a mild action tremor in the right upper extremity and mild to moderate action tremor in the left upper extremity. He has on Archimedes spiral drawing a significant amount of trembling on the left side less so on the right. He writes with his right hand and his handwriting is tremulous but not micrographic, but almost illegible. Fine motor skills are fairly well-preserved with respect to finger taps hand movements and rapid alternating patting. There is no decrement in amplitude. Foot taps and foot agility is normal. Reflexes are 1-2+ throughout. On cerebellar testing he has no gait or truncal ataxia and finger to nose testing shows no dysmetria or intention tremor. Romberg testing shows mild swaying. But he is able to stand with narrow with stance. His walking shows a slight degree of caution that he has preserved arm swing and walks with regular stride length. His posture is age-appropriate. He has trouble with tandem walk. He turns well without needing extra steps. Sensory exam is intact to light touch, pinprick, vibration and temperature sense.  Assessment and Plan:   In summary, Paul Hinnenkamp. is a very pleasant 79 y.o.-year old male with a history of with a bilateral upper extremity tremor, L worse than R. His Hx and exam are most c/w ET. , as you mentioned. He is already on a low-dose beta blocker namely metoprolol 25 mg daily but increasing that may been reducing his heart rate more and he is already bradycardic at 52. I am reluctant to try him on primidone, for fear of exacerbating his dizziness and balance problems and sedating him. He is also not eager on starting new medications at this time. We mutually agreed to continue with watchful  waiting for now and reevaluating things in 6 monthly intervals. Again, I do not detect any parkinsonian  signs. He is advised to keep well hydrated and change positions slowly and avoid climbing on ladders. I will see him back in about 6 months, sooner if the need arises.

## 2013-05-15 ENCOUNTER — Ambulatory Visit (INDEPENDENT_AMBULATORY_CARE_PROVIDER_SITE_OTHER): Payer: Medicare Other | Admitting: Cardiovascular Disease

## 2013-05-15 ENCOUNTER — Encounter: Payer: Self-pay | Admitting: Cardiovascular Disease

## 2013-05-15 VITALS — BP 140/68 | HR 56 | Resp 16 | Ht 66.0 in | Wt 171.3 lb

## 2013-05-15 DIAGNOSIS — I2510 Atherosclerotic heart disease of native coronary artery without angina pectoris: Secondary | ICD-10-CM

## 2013-05-15 DIAGNOSIS — E785 Hyperlipidemia, unspecified: Secondary | ICD-10-CM

## 2013-05-15 DIAGNOSIS — I2589 Other forms of chronic ischemic heart disease: Secondary | ICD-10-CM

## 2013-05-15 DIAGNOSIS — I255 Ischemic cardiomyopathy: Secondary | ICD-10-CM

## 2013-05-15 DIAGNOSIS — I472 Ventricular tachycardia: Secondary | ICD-10-CM

## 2013-05-15 DIAGNOSIS — I4729 Other ventricular tachycardia: Secondary | ICD-10-CM

## 2013-05-15 NOTE — Patient Instructions (Signed)
DECREASE Metoprolol ER 25mg  to 1/2 tablet daily.  Your physician recommends that you schedule a follow-up appointment in: 3 months.

## 2013-05-17 ENCOUNTER — Encounter: Payer: Self-pay | Admitting: Internal Medicine

## 2013-05-17 DIAGNOSIS — K4030 Unilateral inguinal hernia, with obstruction, without gangrene, not specified as recurrent: Secondary | ICD-10-CM | POA: Diagnosis not present

## 2013-05-17 MED ORDER — METOPROLOL SUCCINATE ER 25 MG PO TB24: 12.5000 mg | ORAL_TABLET | Freq: Every day | ORAL | Status: DC

## 2013-05-18 ENCOUNTER — Other Ambulatory Visit: Payer: Self-pay | Admitting: Internal Medicine

## 2013-05-18 DIAGNOSIS — R1032 Left lower quadrant pain: Secondary | ICD-10-CM

## 2013-05-22 ENCOUNTER — Ambulatory Visit
Admission: RE | Admit: 2013-05-22 | Discharge: 2013-05-22 | Disposition: A | Discharge status: Home / Self Care | Payer: Medicare Other | Source: Ambulatory Visit | Attending: Internal Medicine | Admitting: Internal Medicine

## 2013-05-22 DIAGNOSIS — R1032 Left lower quadrant pain: Secondary | ICD-10-CM

## 2013-05-22 MED ORDER — IOHEXOL 300 MG/ML  SOLN
100.0000 mL | Freq: Once | INTRAMUSCULAR | Status: AC | PRN
  Administered 2013-05-22: 100 mL via INTRAVENOUS

## 2013-05-23 ENCOUNTER — Encounter: Payer: Self-pay | Admitting: Cardiovascular Disease

## 2013-05-23 DIAGNOSIS — I472 Ventricular tachycardia: Secondary | ICD-10-CM | POA: Insufficient documentation

## 2013-05-23 NOTE — Progress Notes (Signed)
Patient ID: Paul Frazier., male   DOB: 1933-01-30, 79 y.o.   MRN: [id number]      Reason for office visit CAD, ischemic cardiomyopathy  Paul Frazier has not had problems with angina or shortness of breath since his last appointment but is developing worsening gait instability and fatigue. He feels weak. This is especially prominent when he suddenly stands up in a pattern suggestive of orthostatic hypotension. He denies any shortness of breath with moderate activity but is always worried about fall risk. He also has worsening tremor is present both at rest and with activity and makes it hard to eat soup or drink from a glass. He has a complicated history of coronary disease and underwent bypass surgery in 1986. He has total occlusion of his LAD artery and his dominant left circumflex coronary artery has graft dependent. He presented with a moderate size non-ST segment elevation myocardial infarction in the setting of paroxysmal supraventricular tachycardia in October 2012 and was treated in [facility]. The catheterization report is a little confusing since it describes grafts with different locations than those described at the time of his bypass procedure, however it appears that he has lost the sequential bypass to the oblique marginal artery. A more recent nuclear stress test (May 2014) showed that his left ventricular ejection fraction was down to 42% with inferolateral and anterolateral scar and hypokinesia but without any reversible ischemic abnormalities.  I was concerned that he had possibly developed disease in the sequential saphenous vein graft to the PDA/PLA based on that report and recommended coronary angiography but he wanted to proceed with conservative management. He has not had any serious event since that time.   Allergies  Allergen Reactions  . Erythromycin Nausea Only    Current Outpatient Prescriptions  Medication Sig Dispense Refill  . aspirin 81 MG tablet Take  81 mg by mouth daily.      [name] buPROPion (WELLBUTRIN XL) 300 MG 24 hr tablet Take 300 mg by mouth daily.      . diazepam (VALIUM) 5 MG tablet Take 5 mg by mouth at bedtime as needed for anxiety.      . fish oil-omega-3 fatty acids 1000 MG capsule Take 1 g by mouth daily. Take 3 daily      . magnesium citrate SOLN Take 1 Bottle by mouth once.      . Pomegranate, Punica granatum, (POMEGRANATE PO) Take 1 tablet by mouth daily.      [name] Specialty Vitamins Products (PROSTATE) TABS Take 2 tablets by mouth.      . metoprolol succinate (TOPROL XL) 25 MG 24 hr tablet Take 0.5 tablets (12.5 mg total) by mouth daily.  90 tablet  3   No current facility-administered medications for this visit.    Past Medical History  Diagnosis Date  . Arthritis   . Hypertension   . CAD (coronary artery disease)   . Old myocardial infarct   . S/P CABG x 5 08/29/1984    LIMA to LAD,SVG to diagonal,SVG to OM,SVG to PDA and PLA  . Tremor, essential   . Hyperlipidemia     Past Surgical History  Procedure Laterality Date  . Coronary artery bypass graft  1986    x5 LIMA to LAD,SVG to diagonal,SVG to OM,SVG to PDA and PLA  . Coronary stent placement    . Cholecystectomy  10/1990  . Inguinal hernia repair  04/25/2012    right- laparoscopic  . US echocardiography  12/27/2008  concentric LVH,mild to mod MR, TR and pulmonary hypertension. EF 45-50%  . R/p myoview  12/27/2008    no ischemia    Family History  Problem Relation Age of Onset  . Cancer Father     bone  . Cancer Brother     lymphnode    History   Social History  . Marital Status: Married    Spouse Name: N/A    Number of Children: N/A  . Years of Education: N/A   Occupational History  . Not on file.   Social History Main Topics  . Smoking status: Former Smoker    Start date: 07/19/1970  . Smokeless tobacco: Never Used  . Alcohol Use: No  . Drug Use: No  . Sexual Activity: Not on file   Other Topics Concern  . Not on file   Social  History Narrative  . No narrative on file    Review of systems: His complaint is gait instability and fear of falling. He has worsening tremor present both at rest and with activity The patient specifically denies any chest pain at rest or with exertion, dyspnea at rest or with exertion, orthopnea, paroxysmal nocturnal dyspnea, syncope, palpitations, focal neurological deficits, intermittent claudication, lower extremity edema, unexplained weight gain, cough, hemoptysis or wheezing.  The patient also denies abdominal pain, nausea, vomiting, dysphagia, diarrhea, constipation, polyuria, polydipsia, dysuria, hematuria, frequency, urgency, abnormal bleeding or bruising, fever, chills, unexpected weight changes, mood swings, change in skin or hair texture, change in voice quality, auditory or visual problems, allergic reactions or rashes, new musculoskeletal complaints other than usual "aches and pains".   PHYSICAL EXAM BP 140/68  Pulse 56  Resp 16  Ht 5\' 6"  (1.676 m)  Wt 171 lb 4.8 oz (77.701 kg)  BMI 27.66 kg/m2  General: Alert, oriented x3, no distress Head: no evidence of trauma, PERRL, EOMI, no exophtalmos or lid lag, no myxedema, no xanthelasma; normal ears, nose and oropharynx Neck: normal jugular venous pulsations and no hepatojugular reflux; brisk carotid pulses without delay and no carotid bruits Chest: clear to auscultation, no signs of consolidation by percussion or palpation, normal fremitus, symmetrical and full respiratory excursions, sternotomy scar Cardiovascular: normal position and quality of the apical impulse, regular rhythm, normal first and paradoxically split second heart sounds, no murmurs, rubs or gallops Abdomen: no tenderness or distention, no masses by palpation, no abnormal pulsatility or arterial bruits, normal bowel sounds, no hepatosplenomegaly Extremities: no clubbing, cyanosis or edema; 2+ radial, ulnar and brachial pulses bilaterally; 2+ right femoral,  posterior tibial and dorsalis pedis pulses; 2+ left femoral, posterior tibial and dorsalis pedis pulses; no subclavian or femoral bruits Neurological: grossly nonfocal   EKG: Sinus rhythm, left bundle branch block  Lipid Panel September 2013 total cholesterol 177, triglycerides 138, HDL 51, LDL 98, hemoglobin 15.6, creatinine 1.09 normal liver function tests  ASSESSMENT AND PLAN Cardiomyopathy, ischemic He does not have any symptoms of congestive heart failure and is able to perform moderate exertion without shortness of breath. Unfortunately have had to gradually reduce ACE inhibitor and beta blocker therapy because of weakness and unsteady gait with orthostatic hypotension. This continues to be a problem. We'll reduce the beta blocker dose in half. Last estimation of his ejection fraction was 42% by a nuclear scintigraphy almost half a year ago now. Currently there are no clinical signs of hypervolemia. He does not take any diuretic therapy. If left ventricular ejection fraction deteriorates further and if he develops symptoms of congestive heart failure he  might benefit from cardiac resynchronization therapy. He has a left bundle-branch block with a very broad QRS of 150 ms.  CAD (coronary artery disease) His history of coronary disease is extensive and I'm still a little confused about his exact coronary anatomy. The report from his last cardiac catheterization performed in [place] describes grafts with different locations and the ones described in his bypass operative report. I suspect it is just a question of different nomenclature rather than any error in description. He had an intermediate risk stress nuclear study. There is anterolateral and inferolateral and hypokinesis of the inferolateral wall with mod global decrease in LV function. I have recommended that he undergo a repeat coronary angiogram to clarify the situation especially since earlier this year he was having chest discomfort. He  is now free of angina. He did not want to go through with any invasive procedures at the time but now is reconsidering. However he wants to delay any kind of assessment still January after his wife Paul Frazier has cataract surgery. At this point I feel that it is less urgent to perform coronary angiography since he has been stable for the last 6 months. Unfortunately his echocardiogram is useless to assess risk because of the presence of left bundle branch block. I would strongly recommend coronary driver she is she develops angina or clinical heart failure.   Dyslipidemia His last LDL cholesterol was 98. Ideally we would push this down to less than 70 mg/dL but he refuses to take statins as he is concerned about the potential side effects. He is mostly preoccupied at this time with his generalized weakness and poor gait.   have recommended that he decrease the metacarpal succinate 12.5 mg daily. This should help with his weakness and dizziness and unsteady gait, paradoxically however his tremor may worsen Orders Placed This Encounter  Procedures  . EKG 12-Lead   Meds ordered this encounter  Medications  . metoprolol succinate (TOPROL XL) 25 MG 24 hr tablet    Sig: Take 0.5 tablets (12.5 mg total) by mouth daily.    Dispense:  90 tablet    Refill:  3    CROITORU,MIHAI  Thurmon Fair, MD, [facility] [phone] office [phone] pager

## 2013-05-23 NOTE — Assessment & Plan Note (Signed)
His last LDL cholesterol was 98. Ideally we would push this down to less than 70 mg/dL but he refuses to take statins as he is concerned about the potential side effects. He is mostly preoccupied at this time with his generalized weakness and poor gait.

## 2013-05-23 NOTE — Assessment & Plan Note (Signed)
His history of coronary disease is extensive and I'm still a little confused about his exact coronary anatomy. The report from his last cardiac catheterization performed in [place] describes grafts with different locations and the ones described in his bypass operative report. I suspect it is just a question of different nomenclature rather than any error in description. He had an intermediate risk stress nuclear study. There is anterolateral and inferolateral and hypokinesis of the inferolateral wall with mod global decrease in LV function. I have recommended that he undergo a repeat coronary angiogram to clarify the situation especially since earlier this year he was having chest discomfort. He is now free of angina. He did not want to go through with any invasive procedures at the time but now is reconsidering. However he wants to delay any kind of assessment still January after his wife Liborio Nixon has cataract surgery. At this point I feel that it is less urgent to perform coronary angiography since he has been stable for the last 6 months. Unfortunately his echocardiogram is useless to assess risk because of the presence of left bundle branch block. I would strongly recommend coronary driver she is she develops angina or clinical heart failure.

## 2013-05-23 NOTE — Assessment & Plan Note (Addendum)
He does not have any symptoms of congestive heart failure and is able to perform moderate exertion without shortness of breath. Unfortunately have had to gradually reduce ACE inhibitor and beta blocker therapy because of weakness and unsteady gait with orthostatic hypotension. This continues to be a problem. We'll reduce the beta blocker dose in half. Last estimation of his ejection fraction was 42% by a nuclear scintigraphy almost half a year ago now. Currently there are no clinical signs of hypervolemia. He does not take any diuretic therapy. If left ventricular ejection fraction deteriorates further and if he develops symptoms of congestive heart failure he might benefit from cardiac resynchronization therapy. He has a left bundle-branch block with a very broad QRS of 150 ms.

## 2013-05-25 ENCOUNTER — Other Ambulatory Visit: Payer: Self-pay | Admitting: Internal Medicine

## 2013-05-25 ENCOUNTER — Encounter: Payer: Self-pay | Admitting: Internal Medicine

## 2013-05-25 ENCOUNTER — Ambulatory Visit: Payer: Medicare Other | Admitting: Internal Medicine

## 2013-05-25 VITALS — BP 130/76 | HR 60 | Temp 98.1°F | Resp 16 | Ht 66.5 in | Wt 170.2 lb

## 2013-05-25 DIAGNOSIS — Z79899 Other long term (current) drug therapy: Secondary | ICD-10-CM | POA: Diagnosis not present

## 2013-05-25 DIAGNOSIS — R972 Elevated prostate specific antigen [PSA]: Secondary | ICD-10-CM | POA: Diagnosis not present

## 2013-05-25 DIAGNOSIS — Z1212 Encounter for screening for malignant neoplasm of rectum: Secondary | ICD-10-CM | POA: Diagnosis not present

## 2013-05-25 DIAGNOSIS — C61 Malignant neoplasm of prostate: Secondary | ICD-10-CM | POA: Diagnosis not present

## 2013-05-25 DIAGNOSIS — R7309 Other abnormal glucose: Secondary | ICD-10-CM

## 2013-05-25 DIAGNOSIS — I10 Essential (primary) hypertension: Secondary | ICD-10-CM | POA: Diagnosis not present

## 2013-05-25 DIAGNOSIS — E559 Vitamin D deficiency, unspecified: Secondary | ICD-10-CM | POA: Diagnosis not present

## 2013-05-25 DIAGNOSIS — E782 Mixed hyperlipidemia: Secondary | ICD-10-CM | POA: Diagnosis not present

## 2013-05-25 LAB — CBC WITH DIFFERENTIAL/PLATELET
Basophils Absolute: 0 10*3/uL (ref 0.0–0.1)
Basophils Relative: 0 % (ref 0–1)
Eosinophils Absolute: 0.2 10*3/uL (ref 0.0–0.7)
Eosinophils Relative: 2 % (ref 0–5)
HCT: 47.9 % (ref 39.0–52.0)
Hemoglobin: 16.8 g/dL (ref 13.0–17.0)
Lymphocytes Relative: 30 % (ref 12–46)
Lymphs Abs: 2.7 10*3/uL (ref 0.7–4.0)
MCH: 31.5 pg (ref 26.0–34.0)
MCHC: 35.1 g/dL (ref 30.0–36.0)
MCV: 89.7 fL (ref 78.0–100.0)
Monocytes Absolute: 0.8 10*3/uL (ref 0.1–1.0)
Monocytes Relative: 8 % (ref 3–12)
Neutro Abs: 5.5 10*3/uL (ref 1.7–7.7)
Neutrophils Relative %: 60 % (ref 43–77)
Platelets: 225 10*3/uL (ref 150–400)
RBC: 5.34 MIL/uL (ref 4.22–5.81)
RDW: 14.8 % (ref 11.5–15.5)
WBC: 9.2 10*3/uL (ref 4.0–10.5)

## 2013-05-25 LAB — MAGNESIUM: Magnesium: 1.8 mg/dL (ref 1.5–2.5)

## 2013-05-25 LAB — TSH: TSH: 0.566 u[IU]/mL (ref 0.350–4.500)

## 2013-05-25 LAB — HEMOGLOBIN A1C
Hgb A1c MFr Bld: 5.9 % — ABNORMAL HIGH (ref <5.7)
Mean Plasma Glucose: 123 mg/dL — ABNORMAL HIGH (ref <117)

## 2013-05-25 LAB — BASIC METABOLIC PANEL WITH GFR
BUN: 17 mg/dL (ref 6–23)
CO2: 29 mEq/L (ref 19–32)
Calcium: 9.4 mg/dL (ref 8.4–10.5)
Chloride: 104 mEq/L (ref 96–112)
Creat: 1.01 mg/dL (ref 0.50–1.35)
GFR, Est African American: 82 mL/min
GFR, Est Non African American: 71 mL/min
Glucose, Bld: 115 mg/dL — ABNORMAL HIGH (ref 70–99)
Potassium: 4.7 mEq/L (ref 3.5–5.3)
Sodium: 139 mEq/L (ref 135–145)

## 2013-05-25 LAB — VITAMIN D 25 HYDROXY (VIT D DEFICIENCY, FRACTURES): Vit D, 25-Hydroxy: 106 ng/mL — ABNORMAL HIGH (ref 30–89)

## 2013-05-25 LAB — HEPATIC FUNCTION PANEL
ALT: 25 U/L (ref 0–53)
AST: 21 U/L (ref 0–37)
Albumin: 4.2 g/dL (ref 3.5–5.2)
Alkaline Phosphatase: 67 U/L (ref 39–117)
Bilirubin, Direct: 0.1 mg/dL (ref 0.0–0.3)
Indirect Bilirubin: 0.5 mg/dL (ref 0.0–0.9)
Total Bilirubin: 0.6 mg/dL (ref 0.3–1.2)
Total Protein: 6.7 g/dL (ref 6.0–8.3)

## 2013-05-25 LAB — LIPID PANEL
Cholesterol: 174 mg/dL (ref 0–200)
HDL: 50 mg/dL (ref >39)
LDL Cholesterol: 104 mg/dL — ABNORMAL HIGH (ref 0–99)
Total CHOL/HDL Ratio: 3.5 Ratio
Triglycerides: 99 mg/dL (ref <150)
VLDL: 20 mg/dL (ref 0–40)

## 2013-05-25 NOTE — Progress Notes (Signed)
Patient ID: Paul Sciara., male   DOB: Oct 25, 1932, 79 y.o.   MRN: [id number]  Complete Physical HPI Patient presents for complete physical.Patient does have Hx of HTN and ASHD undergoing CABG in 1986 and PTCA/stenting in 2000. Pt did have acute MI in 2012 and is currently followed by Dr Royann Shivers and apparently seen recently with no changes in therapy per patient.  Patient's BP has been controlled at home. Patient denies any cardiac Symptoms as chest pain, palpitations, shortness of breath, dizziness or ankle swelling.  Patient's hyperlipidemia is near goal with diet. Last cholesterol last visit was 175  , triglycerides 146  , and LDL 104, but patient feels he can do better with his diet.   Patient has prediabetes/insulin resistance with A1c 5.9% in Feb 2014 and most recent A1c 5.5%. Patient denies visual blurring, diabetic polys, or paresthesias.   Patient has Hx vitamin D deficiency with last Vitamin D 75 in August.  Lastly , patient has elevated PSA 17.96 and declined biopsy by Dr Laverle Patter in July - deferring to annual follow up. Abdominal/Pelvic CT scan done several weeks ago showed no abnormalities, specifically none around the prostate.    Current outpatient prescriptions:aspirin 81 MG tablet, Take 81 mg by mouth daily., Disp: , Rfl: ;  buPROPion (WELLBUTRIN XL) 300 MG 24 hr tablet, Take 300 mg by mouth daily., Disp: , Rfl: ;  diazepam (VALIUM) 5 MG tablet, Take 5 mg by mouth at bedtime as needed for anxiety., Disp: , Rfl: ;  fish oil-omega-3 fatty acids 1000 MG capsule, Take 1 g by mouth daily. Take 3 daily, Disp: , Rfl:  magnesium citrate SOLN, Take 1 Bottle by mouth once., Disp: , Rfl: ;  metoprolol succinate (TOPROL XL) 25 MG 24 hr tablet, Take 0.5 tablets (12.5 mg total) by mouth daily., Disp: 90 tablet, Rfl: 3;  Specialty Vitamins Products (PROSTATE) TABS, Take 2 tablets by mouth., Disp: , Rfl: ;  Pomegranate, [place] granatum, (POMEGRANATE PO), Take 1 tablet by mouth daily., Disp: , Rfl:  .  Allergies  Allergen Reactions  . Erythromycin Nausea Only   Past Medical History  Diagnosis Date  . Arthritis   . Hypertension   . CAD (coronary artery disease)   . Old myocardial infarct   . S/P CABG x 5 08/29/1984    LIMA to LAD,SVG to diagonal,SVG to OM,SVG to PDA and PLA  . Tremor, essential   . Hyperlipidemia    Past Surgical History  Procedure Laterality Date  . Coronary artery bypass graft  1986    x5 LIMA to LAD,SVG to diagonal,SVG to OM,SVG to PDA and PLA  . Coronary stent placement    . Cholecystectomy  10/1990  . Inguinal hernia repair  04/25/2012    right- laparoscopic  . US echocardiography  12/27/2008    concentric LVH,mild to mod MR, TR and pulmonary hypertension. EF 45-50%  . R/p myoview  12/27/2008    no ischemia  04/2012 RIH Repair  Family History  Problem Relation Age of Onset  . Cancer Father     bone  . Cancer Brother     lymphnode   History   Social History  . Marital Status: Married    Spouse Name: N/A    Number of Children: N/A  . Years of Education: N/A   Occupational History  . Not on file.   Social History Main Topics  . Smoking status: Former Smoker    Start date: 07/19/1970  . Smokeless tobacco: Never Used  .  Alcohol Use: No  . Drug Use: No  . Sexual Activity: Not on file   Other Topics Concern  . Not on file   Social History Narrative  . No narrative on file   ROS Constitutional: Denies fever, chills, weight loss/gain, headaches, insomnia, fatigue, night sweats, and change in appetite. Eyes: Denies redness, blurred vision, diplopia, discharge, itchy, watery eyes.  ENT: Denies discharge, congestion, post nasal drip, epistaxis, sore throat, earache, hearing loss, dental pain, Tinnitus, Vertigo, Sinus pain, snoring.  Cardio: Denies chest pain, palpitations, irregular heartbeat, syncope, dyspnea, diaphoresis, orthopnea, PND, claudication, edema Respiratory: denies cough, dyspnea, DOE, pleurisy, hoarseness, laryngitis,  wheezing.  Gastrointestinal: Denies dysphagia, heartburn, reflux, water brash, pain, cramps, nausea, vomiting, bloating, diarrhea, constipation, hematemesis, melena, hematochezia, jaundice, hemorrhoids Genitourinary: Denies dysuria, frequency, urgency, nocturia, hesitancy, discharge, hematuria, flank pain Musculoskeletal: Denies arthralgia, myalgia, stiffness, Jt. Swelling, pain, limp, and strain/sprain. Skin: Denies puritis, rash, hives, warts, acne, eczema, changing in skin lesion Neuro: Weakness, incoordination, spasms, paresthesia, pain. Saw Dr Derald Macleod recently for benigh tremor. Psychiatric: Denies confusion, memory loss, sensory loss Endocrine: Denies change in weight, skin, hair change, nocturia, and paresthesia, Diabetic Polys, visual blurring, hyper /hypo glycemic episodes.  Heme/Lymph: Excessive bleeding, bruising, enlarged lymph nodes Filed Vitals:   05/25/13 1113  BP: 130/76  Pulse: 60  Temp: 98.1 F (36.7 C)  Resp: 16   Physical Exam: General Appearance: Well nourished, in no apparent distress. Eyes: PERRLA, EOMs, conjunctiva no swelling or erythema, normal fundi and vessels. Sinuses: No Frontal/maxillary tenderness ENT/Mouth: EACs Patent / TMs  nl. Nares clear without erythema, swelling, mucoid exudates. Good dentition. No erythema, swelling, or exudate on post pharynx. Tongue normal, non-obstructing. Tonsils not swollen or erythematous. Hearing normal.  Neck: Supple, thyroid normal. No bruits or JVD. Respiratory: Respiratory effort normal.  BS equal bilaterally without rales, rhonci, wheezing or stridor. Cardio: Heart sounds normal, regular rate and rhythm without murmurs, rubs or gallops. Peripheral pulses brisk and equal bilaterally, without edema. No aortic or femoral bruits. Chest: median sternotomy scar. symmetric, with normal excursions and percussion. Abdomen: Flat, soft, with bowl sounds. Nontender, no guarding, rebound, hernias, masses, or organomegaly.  Lymphatics:  Non tender without lymphadenopathy.  Genitourinary: deferred to Dr Laverle Patter. Musculoskeletal: Full ROM all peripheral extremities, joint stability, 5/5 strength, and normal gait. Skin: Warm, dry without rashes, lesions, ecchymosis.  Neuro: Cranial nerves intact, reflexes equal bilaterally. Normal muscle tone, no cerebellar symptoms. Sensation intact.  Pysch: Awake and oriented X 3, normal affect, Insight and Judgment appropriate.   Assessment and Plan  1. HTN 2. ASHD/CABG/PTCA-stent/Hx MI 3. Hyperlipidemia 4. Vitamin D Deficiency 5. Nephrolithiasis 6. Probable Prostate Cancer  Plan: Disposition pending labs - discussed diet,exercise, meds & SE's

## 2013-05-25 NOTE — Patient Instructions (Signed)
Continue diet and meds same pending labs  Cholesterol Cholesterol is a white, waxy, fat-like protein needed by your body in small amounts. The liver makes all the cholesterol you need. It is carried from the liver by the blood through the blood vessels. Deposits (plaque) may build up on blood vessel walls. This makes the arteries narrower and stiffer. Plaque increases the risk for heart attack and stroke. You cannot feel your cholesterol level even if it is very high. The only way to know is by a blood test to check your lipid (fats) levels. Once you know your cholesterol levels, you should keep a record of the test results. Work with your caregiver to to keep your levels in the desired range. WHAT THE RESULTS MEAN:  Total cholesterol is a rough measure of all the cholesterol in your blood.  LDL is the so-called bad cholesterol. This is the type that deposits cholesterol in the walls of the arteries. You want this level to be low.  HDL is the good cholesterol because it cleans the arteries and carries the LDL away. You want this level to be high.  Triglycerides are fat that the body can either burn for energy or store. High levels are closely linked to heart disease. DESIRED LEVELS:  Total cholesterol below 200.  LDL below 100 for people at risk, below 70 for very high risk.  HDL above 50 is good, above 60 is best.  Triglycerides below 150. HOW TO LOWER YOUR CHOLESTEROL:  Diet.  Choose fish or white meat chicken and [place], roasted or baked. Limit fatty cuts of red meat, fried foods, and processed meats, such as sausage and lunch meat.  Eat lots of fresh fruits and vegetables. Choose whole grains, beans, pasta, potatoes and cereals.  Use only small amounts of olive, corn or canola oils. Avoid butter, mayonnaise, shortening or palm kernel oils. Avoid foods with trans-fats.  Use skim/nonfat milk and low-fat/nonfat yogurt and cheeses. Avoid whole milk, cream, ice cream, egg yolks and  cheeses. Healthy desserts include angel food cake, ginger snaps, animal crackers, hard candy, popsicles, and low-fat/nonfat frozen yogurt. Avoid pastries, cakes, pies and cookies.  Exercise.  A regular program helps decrease LDL and raises HDL.  Helps with weight control.  Do things that increase your activity level like gardening, walking, or taking the stairs.  Medication.  May be prescribed by your caregiver to help lowering cholesterol and the risk for heart disease.  You may need medicine even if your levels are normal if you have several risk factors. HOME CARE INSTRUCTIONS   Follow your diet and exercise programs as suggested by your caregiver.  Take medications as directed.  Have blood work done when your caregiver feels it is necessary. MAKE SURE YOU:   Understand these instructions.  Will watch your condition.  Will get help right away if you are not doing well or get worse. Document Released: 03/30/2001 Document Revised: 09/27/2011 Document Reviewed: 09/20/2007 [facility] Patient Information 2014 [place], [place]. Hypertension As your heart beats, it forces blood through your arteries. This force is your blood pressure. If the pressure is too high, it is called hypertension (HTN) or high blood pressure. HTN is dangerous because you may have it and not know it. High blood pressure may mean that your heart has to work harder to pump blood. Your arteries may be narrow or stiff. The extra work puts you at risk for heart disease, stroke, and other problems.  Blood pressure consists of two numbers, a  higher number over a lower, 110/72, for example. It is stated as "110 over 72." The ideal is below 120 for the top number (systolic) and under 80 for the bottom (diastolic). Write down your blood pressure today. You should pay close attention to your blood pressure if you have certain conditions such as:  Heart failure.  Prior heart attack.  Diabetes  Chronic kidney  disease.  Prior stroke.  Multiple risk factors for heart disease. To see if you have HTN, your blood pressure should be measured while you are seated with your arm held at the level of the heart. It should be measured at least twice. A one-time elevated blood pressure reading (especially in the Emergency Department) does not mean that you need treatment. There may be conditions in which the blood pressure is different between your right and left arms. It is important to see your caregiver soon for a recheck. Most people have essential hypertension which means that there is not a specific cause. This type of high blood pressure may be lowered by changing lifestyle factors such as:  Stress.  Smoking.  Lack of exercise.  Excessive weight.  Drug/tobacco/alcohol use.  Eating less salt. Most people do not have symptoms from high blood pressure until it has caused damage to the body. Effective treatment can often prevent, delay or reduce that damage. TREATMENT  When a cause has been identified, treatment for high blood pressure is directed at the cause. There are a large number of medications to treat HTN. These fall into several categories, and your caregiver will help you select the medicines that are best for you. Medications may have side effects. You should review side effects with your caregiver. If your blood pressure stays high after you have made lifestyle changes or started on medicines,   Your medication(s) may need to be changed.  Other problems may need to be addressed.  Be certain you understand your prescriptions, and know how and when to take your medicine.  Be sure to follow up with your caregiver within the time frame advised (usually within two weeks) to have your blood pressure rechecked and to review your medications.  If you are taking more than one medicine to lower your blood pressure, make sure you know how and at what times they should be taken. Taking two medicines  at the same time can result in blood pressure that is too low. SEEK IMMEDIATE MEDICAL CARE IF:  You develop a severe headache, blurred or changing vision, or confusion.  You have unusual weakness or numbness, or a faint feeling.  You have severe chest or abdominal pain, vomiting, or breathing problems. MAKE SURE YOU:   Understand these instructions.  Will watch your condition.  Will get help right away if you are not doing well or get worse. Document Released: 07/05/2005 Document Revised: 09/27/2011 Document Reviewed: 02/23/2008 [facility] Patient Information 2014 [place], [place].

## 2013-05-26 LAB — URINALYSIS, COMPLETE
Bacteria, UA: NONE SEEN
Bilirubin Urine: NEGATIVE
Casts: NONE SEEN
Crystals: NONE SEEN
Glucose, UA: NEGATIVE mg/dL
Ketones, ur: NEGATIVE mg/dL
Leukocytes, UA: NEGATIVE
Nitrite: NEGATIVE
Protein, ur: NEGATIVE mg/dL
Specific Gravity, Urine: 1.021 (ref 1.005–1.030)
Squamous Epithelial / LPF: NONE SEEN
Urobilinogen, UA: 0.2 mg/dL (ref 0.0–1.0)
pH: 5.5 (ref 5.0–8.0)

## 2013-05-26 LAB — MICROALBUMIN / CREATININE URINE RATIO
Creatinine, Urine: 158 mg/dL
Microalb Creat Ratio: 4.1 mg/g (ref 0.0–30.0)
Microalb, Ur: 0.65 mg/dL (ref 0.00–1.89)

## 2013-05-26 LAB — PSA: PSA: 18.62 ng/mL — ABNORMAL HIGH (ref <=4.00)

## 2013-05-26 LAB — INSULIN, FASTING: Insulin fasting, serum: 17 u[IU]/mL (ref 3–28)

## 2013-08-02 ENCOUNTER — Telehealth: Payer: Self-pay | Admitting: *Deleted

## 2013-08-02 NOTE — Telephone Encounter (Signed)
I agree he should not restart the lisinopril. Please reassure him that taking it would not have any impact on his rapid heart beat - no connection MC

## 2013-08-02 NOTE — Telephone Encounter (Signed)
Pt called and wanted to know if he can come in sooner than his scheduled appointment. He stated that they were going to discuss a cath. The pt stated that he has had some issues going on the last week. He also wanted to know if he should start Lisinopril.Paul Frazier  [place]

## 2013-08-02 NOTE — Telephone Encounter (Signed)
Pt called and wanted to know if he can come in sooner than his scheduled appointment. He stated that they were going to discuss a cath. The pt stated that he has had some issues going on the last week. He also wanted to know if he should start Lisinopril..  MC 

## 2013-08-02 NOTE — Telephone Encounter (Signed)
Returned call and Paul Frazier verified x 2.  Paul Frazier c/o having an episode this week of his heart beating fast.  Stated it lasted for about 5-10 mins.  Denied SOB and c/o intermittent CP that is relieved after resting and did not require NTG.  Paul Frazier requesting a sooner appt.  Also asked if Dr. Loletha Grayer wants him to restart lisinopril.  Stated his primary stopped it some time last year and it was last filled in July 2013.  Paul Frazier advised to discard that bottle of lisinopril as it has expired and she should not take it.  Paul Frazier advised to avoid taking any meds after the expiration date, which is usually 12 mos from fill date.  Paul Frazier informed Dr. Loletha Grayer will be notified for further instructions as he is scheduled to be seen on 1.29.15 and no sooner appts available w/ Dr. Loletha Grayer.  Paul Frazier verbalized understanding and agreed w/ plan.  Message forwarded to Dr. Sallyanne Kuster.

## 2013-08-03 NOTE — Telephone Encounter (Signed)
Returned call and informed pt per instructions by MD/PA.  Pt verbalized understanding and agreed w/ plan.  Pt already on wait list for Dr. Sallyanne Kuster and stated he will wait to see him when RN offered sooner appt w/ an Extender.

## 2013-08-08 ENCOUNTER — Encounter: Payer: Self-pay | Admitting: Cardiovascular Disease

## 2013-08-08 ENCOUNTER — Ambulatory Visit (INDEPENDENT_AMBULATORY_CARE_PROVIDER_SITE_OTHER): Payer: Medicare Other | Admitting: Cardiovascular Disease

## 2013-08-08 ENCOUNTER — Other Ambulatory Visit: Payer: Self-pay | Admitting: Internal Medicine

## 2013-08-08 VITALS — BP 140/53 | HR 57 | Resp 16 | Ht 66.5 in | Wt 172.6 lb

## 2013-08-08 DIAGNOSIS — R079 Chest pain, unspecified: Secondary | ICD-10-CM | POA: Diagnosis not present

## 2013-08-08 DIAGNOSIS — I2589 Other forms of chronic ischemic heart disease: Secondary | ICD-10-CM | POA: Diagnosis not present

## 2013-08-08 DIAGNOSIS — I2510 Atherosclerotic heart disease of native coronary artery without angina pectoris: Secondary | ICD-10-CM

## 2013-08-08 DIAGNOSIS — I255 Ischemic cardiomyopathy: Secondary | ICD-10-CM

## 2013-08-08 MED ORDER — METOPROLOL SUCCINATE ER 25 MG PO TB24: 25.0000 mg | ORAL_TABLET | Freq: Every day | ORAL | Status: DC

## 2013-08-08 NOTE — Patient Instructions (Signed)
Increase Metoprolol 25mg  to one tablet daily.  A new Rx has been sent to your pharmacy electronically.  Your physician recommends that you schedule a follow-up appointment in: 3 MONTHS.

## 2013-08-12 ENCOUNTER — Encounter: Payer: Self-pay | Admitting: Cardiovascular Disease

## 2013-08-12 NOTE — Progress Notes (Signed)
Patient ID: Paul Nap., male   DOB: Feb 17, 1933, 80 y.o.   MRN: [id number]     Reason for office visit CAD, exertional angina, ischemic cardiomyopathy  Binh continues to describe exertional angina pectoris, CCS class II. Generally it only bothers him if he is performing physical exertion outside in cold weather. The other circumstance that he has noticed it is when he becomes exceptionally excited but chest discomfort retreats quickly if he settles down. This has made him avoid sexual intercourse. Has not taken any nitroglycerin.  He has a complicated history of coronary disease and underwent bypass surgery in 1986. He has total occlusion of his LAD artery and his dominant left circumflex coronary artery has graft dependent. He presented with a moderate size non-ST segment elevation myocardial infarction in the setting of paroxysmal supraventricular tachycardia in October 2012 and was treated in [facility]. The catheterization report is a little confusing since it describes grafts with different locations than those described at the time of his bypass procedure, however it appears that he has lost the sequential bypass to the oblique marginal artery. A more recent nuclear stress test (May 2014) showed that his left ventricular ejection fraction was down to 42% with inferolateral and anterolateral scar and hypokinesia but without any reversible ischemic abnormalities.   I was concerned that he had possibly developed disease in the sequential saphenous vein graft to the PDA/PLA based on that report and recommended coronary angiography but he wanted to proceed with conservative management. He has not had any serious event since that time. As more and more time passes since that last evaluation the urgency of coronary angiography/revascularization becomes less and less.  Allergies  Allergen Reactions  . Erythromycin Nausea Only    Current Outpatient Prescriptions  Medication Sig  Dispense Refill  . aspirin 81 MG tablet Take 81 mg by mouth daily.      [name] buPROPion (WELLBUTRIN XL) 300 MG 24 hr tablet TAKE ONE TABLET BY MOUTH EVERY DAY FOR  MOOD  30 tablet  3  . diazepam (VALIUM) 5 MG tablet Take 5 mg by mouth at bedtime as needed for anxiety.      . Digestive Enzymes CAPS Take 1 capsule by mouth as needed.      . fish oil-omega-3 fatty acids 1000 MG capsule Take 1 g by mouth daily. Take 3 daily      . magnesium citrate SOLN Take 1 Bottle by mouth once.      . metoprolol succinate (TOPROL XL) 25 MG 24 hr tablet Take 1 tablet (25 mg total) by mouth daily.  30 tablet  11  . nitroGLYCERIN (NITROSTAT) 0.4 MG SL tablet Place 0.4 mg under the tongue every 5 (five) minutes as needed for chest pain.      . Pomegranate, Punica granatum, (POMEGRANATE PO) Take 1 tablet by mouth daily.      . Quercetin 250 MG TABS Take 1 tablet by mouth daily.      [name] Specialty Vitamins Products (PROSTATE) TABS Take 2 tablets by mouth.       No current facility-administered medications for this visit.    Past Medical History  Diagnosis Date  . Arthritis   . Hypertension   . CAD (coronary artery disease)   . Old myocardial infarct   . S/P CABG x 5 08/29/1984    LIMA to LAD,SVG to diagonal,SVG to OM,SVG to PDA and PLA  . Tremor, essential   . Hyperlipidemia     Past  Surgical History  Procedure Laterality Date  . Coronary artery bypass graft  1986    x5 LIMA to LAD,SVG to diagonal,SVG to OM,SVG to PDA and PLA  . Coronary stent placement    . Cholecystectomy  10/1990  . Inguinal hernia repair  04/25/2012    right- laparoscopic  . US echocardiography  12/27/2008    concentric LVH,mild to mod MR, TR and pulmonary hypertension. EF 45-50%  . R/p myoview  12/27/2008    no ischemia    Family History  Problem Relation Age of Onset  . Cancer Father     bone  . Cancer Brother     lymphnode    History   Social History  . Marital Status: Married    Spouse Name: N/A    Number of Children:  N/A  . Years of Education: N/A   Occupational History  . Not on file.   Social History Main Topics  . Smoking status: Former Smoker    Start date: 07/19/1970  . Smokeless tobacco: Never Used  . Alcohol Use: No  . Drug Use: No  . Sexual Activity: Not on file   Other Topics Concern  . Not on file   Social History Narrative  . No narrative on file    Review of systems: His complaint is gait instability and fear of falling. He attributes his dizziness to a previous head injury He has worsening tremor present both at rest and with activity. Has exertional chest pain when the weather is cold The patient specifically denies any chest pain at rest, dyspnea at rest or with exertion, orthopnea, paroxysmal nocturnal dyspnea, syncope, palpitations, focal neurological deficits, intermittent claudication, lower extremity edema, unexplained weight gain, cough, hemoptysis or wheezing.  The patient also denies abdominal pain, nausea, vomiting, dysphagia, diarrhea, constipation, polyuria, polydipsia, dysuria, hematuria, frequency, urgency, abnormal bleeding or bruising, fever, chills, unexpected weight changes, mood swings, change in skin or hair texture, change in voice quality, auditory or visual problems, allergic reactions or rashes, new musculoskeletal complaints other than usual "aches and pains".   PHYSICAL EXAM BP 140/53  Pulse 57  Resp 16  Ht 5' 6.5" (1.689 m)  Wt 78.291 kg (172 lb 9.6 oz)  BMI 27.44 kg/m2  General: Alert, oriented x3, no distress  Head: no evidence of trauma, PERRL, EOMI, no exophtalmos or lid lag, no myxedema, no xanthelasma; normal ears, nose and oropharynx  Neck: normal jugular venous pulsations and no hepatojugular reflux; brisk carotid pulses without delay and no carotid bruits  Chest: clear to auscultation, no signs of consolidation by percussion or palpation, normal fremitus, symmetrical and full respiratory excursions, sternotomy scar  Cardiovascular: normal  position and quality of the apical impulse, regular rhythm, normal first and paradoxically split second heart sounds, no murmurs, rubs or gallops  Abdomen: no tenderness or distention, no masses by palpation, no abnormal pulsatility or arterial bruits, normal bowel sounds, no hepatosplenomegaly  Extremities: no clubbing, cyanosis or edema; 2+ radial, ulnar and brachial pulses bilaterally; 2+ right femoral, posterior tibial and dorsalis pedis pulses; 2+ left femoral, posterior tibial and dorsalis pedis pulses; no subclavian or femoral bruits  Neurological: grossly nonfocal   EKG: Sinus rhythm, left bundle branch block  Lipid Panel     Component Value Date/Time   CHOL 174 05/25/2013 1037   TRIG 99 05/25/2013 1037   HDL 50 05/25/2013 1037   CHOLHDL 3.5 05/25/2013 1037   VLDL 20 05/25/2013 1037   LDLCALC 104* 05/25/2013 1037    BMET  Component Value Date/Time   NA 139 05/25/2013 1037   K 4.7 05/25/2013 1037   CL 104 05/25/2013 1037   CO2 29 05/25/2013 1037   GLUCOSE 115* 05/25/2013 1037   BUN 17 05/25/2013 1037   CREATININE 1.01 05/25/2013 1037   CALCIUM 9.4 05/25/2013 1037     ASSESSMENT AND PLAN  The major issue appears to be exertional angina and there is a lot of room to increase antianginal therapy. At his request in the past we had reduced beta blocker dosage because of concerns that this is contributing to his dizziness. More beta blocker seems to be the optimal solution at this point. It may also help with his tremor. There is also room to increase long-acting nitrates or even to add Ranexa. He still prefers noninvasive management. He has never had signs or symptoms of congestive heart failure despite the mild depression in left ventricular systolic function.  Patient Instructions  Increase Metoprolol 25mg  to one tablet daily.  A new Rx has been sent to your pharmacy electronically.  Your physician recommends that you schedule a follow-up appointment in: 3 MONTHS.     Orders  Placed This Encounter  Procedures  . EKG 12-Lead   Meds ordered this encounter  Medications  . nitroGLYCERIN (NITROSTAT) 0.4 MG SL tablet    Sig: Place 0.4 mg under the tongue every 5 (five) minutes as needed for chest pain.  . Quercetin 250 MG TABS    Sig: Take 1 tablet by mouth daily.  . Digestive Enzymes CAPS    Sig: Take 1 capsule by mouth as needed.  . metoprolol succinate (TOPROL XL) 25 MG 24 hr tablet    Sig: Take 1 tablet (25 mg total) by mouth daily.    Dispense:  30 tablet    Refill:  [address] [place], [facility] [phone] office [phone] pager

## 2013-08-16 ENCOUNTER — Ambulatory Visit: Payer: Medicare Other | Admitting: Cardiovascular Disease

## 2013-08-17 DIAGNOSIS — R972 Elevated prostate specific antigen [PSA]: Secondary | ICD-10-CM | POA: Diagnosis not present

## 2013-08-22 ENCOUNTER — Ambulatory Visit (INDEPENDENT_AMBULATORY_CARE_PROVIDER_SITE_OTHER): Payer: Medicare Other | Admitting: Internal Medicine

## 2013-08-22 ENCOUNTER — Encounter: Payer: Self-pay | Admitting: Internal Medicine

## 2013-08-22 ENCOUNTER — Ambulatory Visit: Payer: Self-pay | Admitting: Internal Medicine

## 2013-08-22 VITALS — BP 118/76 | HR 56 | Temp 97.7°F | Resp 18 | Wt 170.0 lb

## 2013-08-22 DIAGNOSIS — E782 Mixed hyperlipidemia: Secondary | ICD-10-CM

## 2013-08-22 DIAGNOSIS — R7309 Other abnormal glucose: Secondary | ICD-10-CM

## 2013-08-22 DIAGNOSIS — I10 Essential (primary) hypertension: Secondary | ICD-10-CM

## 2013-08-22 DIAGNOSIS — Z79899 Other long term (current) drug therapy: Secondary | ICD-10-CM

## 2013-08-22 DIAGNOSIS — E559 Vitamin D deficiency, unspecified: Secondary | ICD-10-CM

## 2013-08-22 LAB — LIPID PANEL
Cholesterol: 185 mg/dL (ref 0–200)
HDL: 48 mg/dL (ref >39)
LDL Cholesterol: 117 mg/dL — ABNORMAL HIGH (ref 0–99)
Total CHOL/HDL Ratio: 3.9 Ratio
Triglycerides: 99 mg/dL (ref <150)
VLDL: 20 mg/dL (ref 0–40)

## 2013-08-22 LAB — CBC WITH DIFFERENTIAL/PLATELET
Basophils Absolute: 0 10*3/uL (ref 0.0–0.1)
Basophils Relative: 1 % (ref 0–1)
Eosinophils Absolute: 0.2 10*3/uL (ref 0.0–0.7)
Eosinophils Relative: 3 % (ref 0–5)
HCT: 48.6 % (ref 39.0–52.0)
Hemoglobin: 16.7 g/dL (ref 13.0–17.0)
Lymphocytes Relative: 39 % (ref 12–46)
Lymphs Abs: 2.9 10*3/uL (ref 0.7–4.0)
MCH: 31.5 pg (ref 26.0–34.0)
MCHC: 34.4 g/dL (ref 30.0–36.0)
MCV: 91.7 fL (ref 78.0–100.0)
Monocytes Absolute: 0.7 10*3/uL (ref 0.1–1.0)
Monocytes Relative: 9 % (ref 3–12)
Neutro Abs: 3.7 10*3/uL (ref 1.7–7.7)
Neutrophils Relative %: 48 % (ref 43–77)
Platelets: 224 10*3/uL (ref 150–400)
RBC: 5.3 MIL/uL (ref 4.22–5.81)
RDW: 14.1 % (ref 11.5–15.5)
WBC: 7.5 10*3/uL (ref 4.0–10.5)

## 2013-08-22 LAB — HEPATIC FUNCTION PANEL
ALT: 29 U/L (ref 0–53)
AST: 25 U/L (ref 0–37)
Albumin: 4.5 g/dL (ref 3.5–5.2)
Alkaline Phosphatase: 72 U/L (ref 39–117)
Bilirubin, Direct: 0.1 mg/dL (ref 0.0–0.3)
Indirect Bilirubin: 0.6 mg/dL (ref 0.2–1.2)
Total Bilirubin: 0.7 mg/dL (ref 0.2–1.2)
Total Protein: 7.2 g/dL (ref 6.0–8.3)

## 2013-08-22 LAB — BASIC METABOLIC PANEL WITH GFR
BUN: 12 mg/dL (ref 6–23)
CO2: 28 mEq/L (ref 19–32)
Calcium: 9.7 mg/dL (ref 8.4–10.5)
Chloride: 102 mEq/L (ref 96–112)
Creat: 0.94 mg/dL (ref 0.50–1.35)
GFR, Est African American: 89 mL/min
GFR, Est Non African American: 77 mL/min
Glucose, Bld: 96 mg/dL (ref 70–99)
Potassium: 4.7 mEq/L (ref 3.5–5.3)
Sodium: 139 mEq/L (ref 135–145)

## 2013-08-22 LAB — MAGNESIUM: Magnesium: 1.9 mg/dL (ref 1.5–2.5)

## 2013-08-22 LAB — TSH: TSH: 0.373 u[IU]/mL (ref 0.350–4.500)

## 2013-08-22 LAB — HEMOGLOBIN A1C
Hgb A1c MFr Bld: 5.8 % — ABNORMAL HIGH (ref <5.7)
Mean Plasma Glucose: 120 mg/dL — ABNORMAL HIGH (ref <117)

## 2013-08-22 NOTE — Progress Notes (Signed)
Patient ID: Paul Nap., male   DOB: 11-Jan-1933, 80 y.o.   MRN: [id number]   This very nice 80 y.o. MWM presents for 3 month follow up with Hypertension, Hyperlipidemia, Pre-Diabetes and Vitamin D Deficiency.    HTN predates many years. BP has been controlled at home. Today's BP: 118/76 mmHg . Patient had CABG in 1986, PTCA/Stents in 2000 and An AMI in 2012. Patient denies any cardiac type chest pain, palpitations, dyspnea/orthopnea/PND, dizziness, claudication, or dependent edema.   Hyperlipidemia is controlled with diet & meds. Last Cholesterol was 175, Triglycerides were 124, HDL 46 and LDL 25 in Aug 2014. Patient denies myalgias or other med SE's.    Also, the patient has history of PreDiabetes A1c 5.8% in June 2013 and with last A1c of also 5.8% in Aug 2014. Patient denies any symptoms of reactive hypoglycemia, diabetic polys, paresthesias or visual blurring.   Further, Patient has history of Vitamin D Deficiency of 40 in June and with last vitamin D of 75 in Aug 2014.[name] Patient supplements vitamin D without any suspected side-effects.    Medication List         aspirin 81 MG tablet  Take 81 mg by mouth daily.     buPROPion 300 MG 24 hr tablet  Commonly known as:  WELLBUTRIN XL  TAKE ONE TABLET BY MOUTH EVERY DAY FOR  MOOD     diazepam 5 MG tablet  Commonly known as:  VALIUM  Take 5 mg by mouth at bedtime as needed for anxiety.     Digestive Enzymes Caps  Take 1 capsule by mouth as needed.     fish oil-omega-3 fatty acids 1000 MG capsule  Take 1 g by mouth daily. Take 3 daily     magnesium citrate Soln  Take 1 Bottle by mouth once.     MAGNESIUM DR PO  Take by mouth daily.     metoprolol succinate 25 MG 24 hr tablet  Commonly known as:  TOPROL XL  Take 1 tablet (25 mg total) by mouth daily.     nitroGLYCERIN 0.4 MG SL tablet  Commonly known as:  NITROSTAT  Place 0.4 mg under the tongue every 5 (five) minutes as needed for chest pain.     POMEGRANATE PO  Take 1  tablet by mouth daily.     PROSTATE Tabs  Take 2 tablets by mouth.     Quercetin 250 MG Tabs  Take 1 tablet by mouth daily.         Allergies  Allergen Reactions  . Erythromycin Nausea Only    PMHx:   Past Medical History  Diagnosis Date  . Arthritis   . Hypertension   . CAD (coronary artery disease)   . Old myocardial infarct   . S/P CABG x 5 08/29/1984    LIMA to LAD,SVG to diagonal,SVG to OM,SVG to PDA and PLA  . Tremor, essential   . Hyperlipidemia     FHx:    Reviewed / unchanged  SHx:    Reviewed / unchanged  Systems Review: Constitutional: Denies fever, chills, wt changes, headaches, insomnia, fatigue, night sweats, change in appetite. Eyes: Denies redness, blurred vision, diplopia, discharge, itchy, watery eyes.  ENT: Denies discharge, congestion, post nasal drip, epistaxis, sore throat, earache, hearing loss, dental pain, tinnitus, vertigo, sinus pain, snoring.  CV: Denies chest pain, palpitations, irregular heartbeat, syncope, dyspnea, diaphoresis, orthopnea, PND, claudication, edema. Respiratory: denies cough, dyspnea, DOE, pleurisy, hoarseness, laryngitis, wheezing.  Gastrointestinal: Denies dysphagia, odynophagia,  heartburn, reflux, water brash, abdominal pain or cramps, nausea, vomiting, bloating, diarrhea, constipation, hematemesis, melena, hematochezia,  or hemorrhoids. Genitourinary: Denies dysuria, frequency, urgency, nocturia, hesitancy, discharge, hematuria, flank pain. Musculoskeletal: Denies arthralgias, myalgias, stiffness, jt. swelling, pain, limp, strain/sprain.  Skin: Denies pruritus, rash, hives, warts, acne, eczema, change in skin lesion(s). Neuro: No weakness, tremor, incoordination, spasms, paresthesia, or pain. Psychiatric: Denies confusion, memory loss, or sensory loss. Endo: Denies change in weight, skin, hair change.  Heme/Lymph: No excessive bleeding, bruising, orenlarged lymph nodes.  BP: 118/76  Pulse: 56  Temp: 97.7 F (36.5 C)   Resp: 18    Estimated body mass index is 27.03 kg/(m^2) as calculated from the following:   Height as of 08/08/13: 5' 6.5" (1.689 m).   Weight as of this encounter: 170 lb (77.111 kg).  On Exam: Appears well nourished - in no distress. Eyes: PERRLA, EOMs, conjunctiva no swelling or erythema. Sinuses: No frontal/maxillary tenderness ENT/Mouth: EAC's clear, TM's nl w/o erythema, bulging. Nares clear w/o erythema, swelling, exudates. Oropharynx clear without erythema or exudates. Oral hygiene is good. Tongue normal, non obstructing. Hearing intact.  Neck: Supple. Thyroid nl. Car 2+/2+ without bruits, nodes or JVD. Chest: Respirations nl with BS clear & equal w/o rales, rhonchi, wheezing or stridor.  Cor: Heart sounds normal w/ regular rate and rhythm without sig. murmurs, gallops, clicks, or rubs. Peripheral pulses normal and equal  without edema.  Abdomen: Soft & bowel sounds normal. Non-tender w/o guarding, rebound, hernias, masses, or organomegaly.  Lymphatics: Unremarkable.  Musculoskeletal: Full ROM all peripheral extremities, joint stability, 5/5 strength, and normal gait.  Skin: Warm, dry without exposed rashes, lesions, ecchymosis apparent.  Neuro: Cranial nerves intact, reflexes equal bilaterally. Sensory-motor testing grossly intact. Tendon reflexes grossly intact.  Pysch: Alert & oriented x 3. Insight and judgement nl & appropriate. No ideations.  Assessment and Plan:  1. Hypertension - Continue monitor blood pressure at home. Continue diet/meds same.  2. Hyperlipidemia - Continue diet/meds, exercise,& lifestyle modifications. Continue monitor periodic cholesterol/liver & renal functions   3. Pre-diabetes/Insulin Resistance - Continue diet, exercise, lifestyle modifications. Monitor appropriate labs.  4. Vitamin D Deficiency - Continue supplementation.  5. ASHD/CABG/Stents  Recommended regular exercise, BP monitoring, weight control, and discussed med and SE's. Recommended  labs to assess and monitor clinical status. Further disposition pending results of labs.

## 2013-08-22 NOTE — Patient Instructions (Signed)

## 2013-08-23 LAB — VITAMIN D 25 HYDROXY (VIT D DEFICIENCY, FRACTURES): Vit D, 25-Hydroxy: 73 ng/mL (ref 30–89)

## 2013-08-23 LAB — INSULIN, FASTING: Insulin fasting, serum: 8 u[IU]/mL (ref 3–28)

## 2013-08-24 DIAGNOSIS — N402 Nodular prostate without lower urinary tract symptoms: Secondary | ICD-10-CM | POA: Diagnosis not present

## 2013-08-24 DIAGNOSIS — R972 Elevated prostate specific antigen [PSA]: Secondary | ICD-10-CM | POA: Diagnosis not present

## 2013-08-31 ENCOUNTER — Ambulatory Visit: Payer: Self-pay | Admitting: Physician Assistant

## 2013-09-19 ENCOUNTER — Telehealth: Payer: Self-pay | Admitting: *Deleted

## 2013-09-19 NOTE — Telephone Encounter (Signed)
Patient called and concerned TSH at low end of normal  Per Dr Melford Aase, level OK. Patient aware.

## 2013-09-28 ENCOUNTER — Other Ambulatory Visit: Payer: Self-pay | Admitting: Internal Medicine

## 2013-10-01 ENCOUNTER — Other Ambulatory Visit: Payer: Self-pay | Admitting: Internal Medicine

## 2013-11-06 ENCOUNTER — Encounter: Payer: Self-pay | Admitting: Cardiovascular Disease

## 2013-11-06 ENCOUNTER — Ambulatory Visit (INDEPENDENT_AMBULATORY_CARE_PROVIDER_SITE_OTHER): Payer: Medicare Other | Admitting: Cardiovascular Disease

## 2013-11-06 VITALS — BP 124/62 | HR 68 | Resp 16 | Ht 66.5 in | Wt 165.6 lb

## 2013-11-06 DIAGNOSIS — R5383 Other fatigue: Secondary | ICD-10-CM

## 2013-11-06 DIAGNOSIS — Z79899 Other long term (current) drug therapy: Secondary | ICD-10-CM | POA: Diagnosis not present

## 2013-11-06 DIAGNOSIS — I4729 Other ventricular tachycardia: Secondary | ICD-10-CM

## 2013-11-06 DIAGNOSIS — I255 Ischemic cardiomyopathy: Secondary | ICD-10-CM

## 2013-11-06 DIAGNOSIS — I472 Ventricular tachycardia: Secondary | ICD-10-CM

## 2013-11-06 DIAGNOSIS — I2510 Atherosclerotic heart disease of native coronary artery without angina pectoris: Secondary | ICD-10-CM

## 2013-11-06 DIAGNOSIS — E291 Testicular hypofunction: Secondary | ICD-10-CM

## 2013-11-06 DIAGNOSIS — E785 Hyperlipidemia, unspecified: Secondary | ICD-10-CM | POA: Diagnosis not present

## 2013-11-06 DIAGNOSIS — I2589 Other forms of chronic ischemic heart disease: Secondary | ICD-10-CM

## 2013-11-06 DIAGNOSIS — I447 Left bundle-branch block, unspecified: Secondary | ICD-10-CM

## 2013-11-06 DIAGNOSIS — R5381 Other malaise: Secondary | ICD-10-CM

## 2013-11-06 MED ORDER — PRAVASTATIN SODIUM 40 MG PO TABS: 40.0000 mg | ORAL_TABLET | Freq: Every evening | ORAL | Status: DC

## 2013-11-06 NOTE — Patient Instructions (Signed)
Start Pravastatin daily with your evening meal.  Have Testosterone level checked at your convenience.  Have FASTING lab work drawn in 2 months at [facility].  Dr. Sallyanne Kuster recommends that you schedule a follow-up appointment in: 6 months.

## 2013-11-06 NOTE — Progress Notes (Signed)
Patient ID: Paul Nap., male   DOB: Mar 28, 1933, 80 y.o.   MRN: [id number]  OSA???? Hx of abnormal sleep study, CPAP. Not receiving Rx  For 2-3 y      Reason for office visit CAD  Gustavus no longer has any exertional angina. As he was telling at our last appointment, this only happens if he exerts himself when the weather is cold. He has not needed to take any nitroglycerin since her last appointment in January. He denies worsening shortness of breath, syncope or palpitations. He does have issues with fatigue and loss of sexual drive and some loss of muscle mass. He specifically asked me to check his testosterone level.  He has a complicated history of coronary disease and underwent bypass surgery in 1986. He has total occlusion of his LAD artery and his dominant left circumflex coronary artery has graft dependent. He presented with a moderate size non-ST segment elevation myocardial infarction in the setting of paroxysmal supraventricular tachycardia in October 2012 and was treated in [facility]. The catheterization report is a little confusing since it describes grafts with different locations than those described at the time of his bypass procedure, however it appears that he has lost the sequential bypass to the oblique marginal artery. A more recent nuclear stress test (May 2014) showed that his left ventricular ejection fraction was down to 42% with inferolateral and anterolateral scar and hypokinesia but without any reversible ischemic abnormalities.  I was concerned that he had possibly developed disease in the sequential saphenous vein graft to the PDA/PLA based on that report and recommended coronary angiography but he wanted to proceed with conservative management. He has not had any serious event since that time. As more and more time passes since that last evaluation the urgency of coronary angiography/revascularization becomes less and less.    Allergies  Allergen  Reactions  . Erythromycin Nausea Only    Current Outpatient Prescriptions  Medication Sig Dispense Refill  . aspirin 81 MG tablet Take 81 mg by mouth daily.      [name] buPROPion (WELLBUTRIN XL) 300 MG 24 hr tablet TAKE ONE TABLET BY MOUTH EVERY DAY FOR  MOOD  30 tablet  3  . diazepam (VALIUM) 5 MG tablet TAKE 1/2-1 TAB THREE TIMES A DAY AS NEEDED FOR MUSCLE SPASM  90 tablet  0  . Digestive Enzymes CAPS Take 1 capsule by mouth as needed.      . fish oil-omega-3 fatty acids 1000 MG capsule Take 1 g by mouth daily. Take 3 daily      . Magnesium Chloride (MAGNESIUM DR PO) Take by mouth daily.      . metoprolol succinate (TOPROL XL) 25 MG 24 hr tablet Take 1 tablet (25 mg total) by mouth daily.  30 tablet  11  . nitroGLYCERIN (NITROSTAT) 0.4 MG SL tablet Place 0.4 mg under the tongue every 5 (five) minutes as needed for chest pain.      . Pomegranate, Punica granatum, (POMEGRANATE PO) Take 1 tablet by mouth daily.      [name] Specialty Vitamins Products (PROSTATE) TABS Take 2 tablets by mouth.      . pravastatin (PRAVACHOL) 40 MG tablet Take 1 tablet (40 mg total) by mouth every evening.  90 tablet  3  . Quercetin 250 MG TABS Take 1 tablet by mouth daily.       No current facility-administered medications for this visit.    Past Medical History  Diagnosis Date  .  Arthritis   . Hypertension   . CAD (coronary artery disease)   . Old myocardial infarct   . S/P CABG x 5 08/29/1984    LIMA to LAD,SVG to diagonal,SVG to OM,SVG to PDA and PLA  . Tremor, essential   . Hyperlipidemia     Past Surgical History  Procedure Laterality Date  . Coronary artery bypass graft  1986    x5 LIMA to LAD,SVG to diagonal,SVG to OM,SVG to PDA and PLA  . Coronary stent placement    . Cholecystectomy  10/1990  . Inguinal hernia repair  04/25/2012    right- laparoscopic  . US echocardiography  12/27/2008    concentric LVH,mild to mod MR, TR and pulmonary hypertension. EF 45-50%  . R/p myoview  12/27/2008    no  ischemia    Family History  Problem Relation Age of Onset  . Cancer Father     bone  . Cancer Brother     lymphnode    History   Social History  . Marital Status: Married    Spouse Name: N/A    Number of Children: N/A  . Years of Education: N/A   Occupational History  . Not on file.   Social History Main Topics  . Smoking status: Former Smoker    Start date: 07/19/1970  . Smokeless tobacco: Never Used  . Alcohol Use: No  . Drug Use: No  . Sexual Activity: Not on file   Other Topics Concern  . Not on file   Social History Narrative  . No narrative on file    Review of systems: The patient specifically denies any chest pain at rest or with exertion, dyspnea at rest or with exertion, orthopnea, paroxysmal nocturnal dyspnea, syncope, palpitations, focal neurological deficits, intermittent claudication, lower extremity edema, unexplained weight gain, cough, hemoptysis or wheezing.  The patient also denies abdominal pain, nausea, vomiting, dysphagia, diarrhea, constipation, polyuria, polydipsia, dysuria, hematuria, frequency, urgency, abnormal bleeding or bruising, fever, chills, unexpected weight changes, mood swings, change in skin or hair texture, change in voice quality, auditory or visual problems, allergic reactions or rashes, new musculoskeletal complaints other than usual "aches and pains".   PHYSICAL EXAM BP 124/62  Pulse 68  Resp 16  Ht 5' 6.5" (1.689 m)  Wt 165 lb 9.6 oz (75.116 kg)  BMI 26.33 kg/m2 General: Alert, oriented x3, no distress  Head: no evidence of trauma, PERRL, EOMI, no exophtalmos or lid lag, no myxedema, no xanthelasma; normal ears, nose and oropharynx  Neck: normal jugular venous pulsations and no hepatojugular reflux; brisk carotid pulses without delay and no carotid bruits  Chest: clear to auscultation, no signs of consolidation by percussion or palpation, normal fremitus, symmetrical and full respiratory excursions, sternotomy scar    Cardiovascular: normal position and quality of the apical impulse, regular rhythm, normal first and paradoxically split second heart sounds, no murmurs, rubs or gallops  Abdomen: no tenderness or distention, no masses by palpation, no abnormal pulsatility or arterial bruits, normal bowel sounds, no hepatosplenomegaly  Extremities: no clubbing, cyanosis or edema; 2+ radial, ulnar and brachial pulses bilaterally; 2+ right femoral, posterior tibial and dorsalis pedis pulses; 2+ left femoral, posterior tibial and dorsalis pedis pulses; no subclavian or femoral bruits  Neurological: grossly nonfocal   EKG: Sinus rhythm, left bundle branch block   Lipid Panel     Component Value Date/Time   CHOL 185 08/22/2013 1224   TRIG 99 08/22/2013 1224   HDL 48 08/22/2013 1224   CHOLHDL  3.9 08/22/2013 1224   VLDL 20 08/22/2013 1224   LDLCALC 117* 08/22/2013 1224    BMET    Component Value Date/Time   NA 139 08/22/2013 1224   K 4.7 08/22/2013 1224   CL 102 08/22/2013 1224   CO2 28 08/22/2013 1224   GLUCOSE 96 08/22/2013 1224   BUN 12 08/22/2013 1224   CREATININE 0.94 08/22/2013 1224   CALCIUM 9.7 08/22/2013 1224   GFRNONAA 77 08/22/2013 1224   GFRAA 89 08/22/2013 1224     ASSESSMENT AND PLAN CAD (coronary artery disease) Currently asymptomatic. He has exertional angina only during cold weather. Knows to call me if there is a change in his symptom pattern.  Cardiomyopathy, ischemic Without overt congestive heart failure  Dyslipidemia His LDL cholesterol is unacceptable the high for his known CAD. It is time that he resume statin therapy. He was trying to avoid statins because of concerns about his generalized weakness and poor gait Prescribed pravastatin for its lower side effect profile. Recheck lipid profile in a couple of months, target LDL 70 or less.  Fatigue This could be explained by low testosterone levels and we'll check this. Today he he told me that he was previously diagnosed with obstructive sleep apnea but  he has not worn CPAP in many years. This is another potential cause of his fatigue and lack of drive. Having said that, he does not have excessive daytime sleepiness or inappropriate episodes of falling asleep or other symptoms of untreated sleep apnea. I thought it would be a good idea that he repeat a sleep study but he declined.  LBBB (left bundle branch block) May be a future candidate for resynchronization therapy, but symptoms and minimally depressed EF did not justify at this time  Nonsustained ventricular tachycardia Incidentally found on an earlier event monitor. No symptoms to suggest significant arrhythmia at this time. Beta blocker should be maintained as part of his medical regimen.   Orders Placed This Encounter  Procedures  . Lipid Profile  . Comp Met (CMET)  . Testosterone   Meds ordered this encounter  Medications  . pravastatin (PRAVACHOL) 40 MG tablet    Sig: Take 1 tablet (40 mg total) by mouth every evening.    Dispense:  90 tablet    Refill:  3    Mihai Croitoru  Sanda Klein, MD, [facility] [phone] office [phone] pager

## 2013-11-07 ENCOUNTER — Encounter: Payer: Self-pay | Admitting: Cardiovascular Disease

## 2013-11-07 LAB — TESTOSTERONE: Testosterone: 265 ng/dL — ABNORMAL LOW (ref 300–890)

## 2013-11-07 NOTE — Assessment & Plan Note (Signed)
Incidentally found on an earlier event monitor. No symptoms to suggest significant arrhythmia at this time. Beta blocker should be maintained as part of his medical regimen.

## 2013-11-07 NOTE — Assessment & Plan Note (Signed)
May be a future candidate for resynchronization therapy, but symptoms and minimally depressed EF did not justify at this time

## 2013-11-07 NOTE — Assessment & Plan Note (Signed)
Without overt congestive heart failure

## 2013-11-07 NOTE — Assessment & Plan Note (Signed)
Currently asymptomatic. He has exertional angina only during cold weather. Knows to call me if there is a change in his symptom pattern.

## 2013-11-07 NOTE — Assessment & Plan Note (Signed)
His LDL cholesterol is unacceptable the high for his known CAD. It is time that he resume statin therapy. He was trying to avoid statins because of concerns about his generalized weakness and poor gait Prescribed pravastatin for its lower side effect profile. Recheck lipid profile in a couple of months, target LDL 70 or less.

## 2013-11-07 NOTE — Assessment & Plan Note (Signed)
This could be explained by low testosterone levels and we'll check this. Today he he told me that he was previously diagnosed with obstructive sleep apnea but he has not worn CPAP in many years. This is another potential cause of his fatigue and lack of drive. Having said that, he does not have excessive daytime sleepiness or inappropriate episodes of falling asleep or other symptoms of untreated sleep apnea. I thought it would be a good idea that he repeat a sleep study but he declined.

## 2013-11-13 ENCOUNTER — Encounter: Payer: Self-pay | Admitting: Neurology

## 2013-11-13 ENCOUNTER — Ambulatory Visit (INDEPENDENT_AMBULATORY_CARE_PROVIDER_SITE_OTHER): Payer: Medicare Other | Admitting: Neurology

## 2013-11-13 VITALS — BP 136/72 | HR 52 | Temp 98.3°F | Ht 66.5 in | Wt 167.0 lb

## 2013-11-13 DIAGNOSIS — I2510 Atherosclerotic heart disease of native coronary artery without angina pectoris: Secondary | ICD-10-CM

## 2013-11-13 DIAGNOSIS — R001 Bradycardia, unspecified: Secondary | ICD-10-CM

## 2013-11-13 DIAGNOSIS — G250 Essential tremor: Secondary | ICD-10-CM

## 2013-11-13 DIAGNOSIS — G252 Other specified forms of tremor: Secondary | ICD-10-CM | POA: Diagnosis not present

## 2013-11-13 DIAGNOSIS — Z820 Family history of epilepsy and other diseases of the nervous system: Secondary | ICD-10-CM | POA: Diagnosis not present

## 2013-11-13 DIAGNOSIS — I252 Old myocardial infarction: Secondary | ICD-10-CM

## 2013-11-13 DIAGNOSIS — Z951 Presence of aortocoronary bypass graft: Secondary | ICD-10-CM | POA: Diagnosis not present

## 2013-11-13 DIAGNOSIS — Z8679 Personal history of other diseases of the circulatory system: Secondary | ICD-10-CM

## 2013-11-13 DIAGNOSIS — I498 Other specified cardiac arrhythmias: Secondary | ICD-10-CM

## 2013-11-13 NOTE — Progress Notes (Signed)
Subjective:    Patient ID: Paul Frazier. is a 80 y.o. male.  HPI    Interim history:   PaulFrazier is a very friendly 80 year old right-handed gentleman who has an underlying diagnosis of CAD, HTN, HLP, who presents for followup consultation of his essential tremor. He is unaccompanied today. I last saw him on 09/14/2012, at which time I kept him on low-dose metoprolol as he already had a low heart rate and I did not start him on primidone for fear of exacerbating his dizziness and balance problems and sedation, given his advanced age.  Today, he reports, that his tremor has good days and bad days, he has had some vertigo, especially in the mornings and he sits up at the bedside and feels a spinning sensation and he has to wait it out, laying back down can also cause vertigo symptoms. He has not fallen. When looking up and reaching up, he has some dizziness, no vertigo. He has had some sharp pains in his temple, very short-lived, none in the last month.   I first met him on 08/24/2012, which time he reported that he fell in December and hit his head. He was already on low-dose beta blocker because of his heart and I did not suggest that he increase it because his heart rate was already in the 50s. We talked about adding another medication down the Road but I did not suggest that we start him just yet. I did suggest a head CT based on his history of fall. He had a head CT on 09/06/2012 which showed a left frontal chronic subdural hematoma with mild midline shift but because he had moderate frontal atrophy there was not a whole other midline shift. I suggested that we repeat his head CT and we called him with his test results. He had a repeat head CT on 10/05/2012: Chronic left frontal subdural hematoma. Mild mass effect upon underlying frontal lobe. Minimal 2 mm of left to right midline shift. Compared to the prior CT from 09/06/12, there is slight reduction in size of the subdural hematoma.  He and  his wife noted that his tremors have become worse since he had a heart attack last year. He has a prior history of coronary artery bypass surgery in the 80s and also had a stent placed in 2001. He has not noticed much in the way of difficulty with his posture. He does notice sometimes that his balance is not very good. Of note he has fallen recently before Christmas last year. He actually tripped over a piece of wire that was outside and landed on the ground which was cold and frozen at the time. He fell and hit his for head. He has not had a head CT. He reports no residual headaches are one-sided weakness, numbness, slurring of speech or droopy face. Nevertheless at the time he had a severe headache. He's had degenerative spine disease and has had epidural injections to his neck. He's had some numbness and tingling radiating to his left index finger. He has seen a neurosurgeon who suggested surgery but left it up to him to decide. He has had epidural injection had his last injection in February 2012.  He has had some lightheadedness, and recently was told to stop his Prinivil. He has not fallen, but has had some close calls. He denies vertigo. Never had TIA or stroke symptoms, denying sudden onset of one sided weakness, numbness, tingling, slurring of speech or droopy face, hearing loss,  tinnitus, diplopia or visual field cut or monocular loss of vision, and denies recurrent headaches.  His tremor affects both hands but is worse on the left. It is primarily a action tremor and his handwriting is impaired. 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 He was very active in his work and had to stop suddenly because of his heart attack. He felt even if he had not stop working because of his heart he would've stopped because of his trembling. He has not noticed a lower extremity tremor. He does not report any stiffness or drooling. He does not report any memory difficulties or other cognitive impairment. He tries  to walk regularly and his wife has not noticed any changes in his gait.  He had a brother who passed away at age 4, from cancer in his lymph nodes and he had PD. He recalls no other FHx of tremors.  He fell off a ladder in 2009 and had surgery.   His Past Medical History Is Significant For: Past Medical History  Diagnosis Date  . Arthritis   . Hypertension   . CAD (coronary artery disease)   . Old myocardial infarct   . S/P CABG x 5 08/29/1984    LIMA to LAD,SVG to diagonal,SVG to OM,SVG to PDA and PLA  . Tremor, essential   . Hyperlipidemia     His Past Surgical History Is Significant For: Past Surgical History  Procedure Laterality Date  . Coronary artery bypass graft  1986    x5 LIMA to LAD,SVG to diagonal,SVG to OM,SVG to PDA and PLA  . Coronary stent placement    . Cholecystectomy  10/1990  . Inguinal hernia repair  04/25/2012    right- laparoscopic  . US echocardiography  12/27/2008    concentric LVH,mild to mod MR, TR and pulmonary hypertension. EF 45-50%  . R/p myoview  12/27/2008    no ischemia    His Family History Is Significant For: Family History  Problem Relation Age of Onset  . Cancer Father     bone  . Cancer Brother     lymphnode    His Social History Is Significant For: History   Social History  . Marital Status: Married    Spouse Name: N/A    Number of Children: N/A  . Years of Education: N/A   Social History Main Topics  . Smoking status: Former Smoker    Start date: 07/19/1970  . Smokeless tobacco: Never Used  . Alcohol Use: No  . Drug Use: No  . Sexual Activity: None   Other Topics Concern  . None   Social History Narrative  . None    His Allergies Are:  Allergies  Allergen Reactions  . Erythromycin Nausea Only  :   His Current Medications Are:  Outpatient Encounter Prescriptions as of 11/13/2013  Medication Sig  . aspirin 81 MG tablet Take 81 mg by mouth daily.  [name] buPROPion (WELLBUTRIN XL) 300 MG 24 hr tablet TAKE ONE TABLET  BY MOUTH EVERY DAY FOR  MOOD  . diazepam (VALIUM) 5 MG tablet TAKE 1/2-1 TAB THREE TIMES A DAY AS NEEDED FOR MUSCLE SPASM  . Digestive Enzymes CAPS Take 1 capsule by mouth as needed.  . fish oil-omega-3 fatty acids 1000 MG capsule Take 1 g by mouth daily. Take 3 daily  . Magnesium Chloride (MAGNESIUM DR PO) Take by mouth daily.  . metoprolol succinate (TOPROL XL) 25 MG 24 hr tablet Take 1 tablet (25 mg total)  by mouth daily.  . nitroGLYCERIN (NITROSTAT) 0.4 MG SL tablet Place 0.4 mg under the tongue every 5 (five) minutes as needed for chest pain.  . pravastatin (PRAVACHOL) 40 MG tablet Take 1 tablet (40 mg total) by mouth every evening.  . Quercetin 250 MG TABS Take 1 tablet by mouth daily.  [name] Specialty Vitamins Products (PROSTATE) TABS Take 2 tablets by mouth.  . Pomegranate, Punica granatum, (POMEGRANATE PO) Take 1 tablet by mouth daily.  :  Review of Systems:  Out of a complete 14 point review of systems, all are reviewed and negative with the exception of these symptoms as listed below:   Review of Systems  Constitutional: Negative.   HENT: Negative.   Eyes: Negative.   Respiratory: Negative.   Cardiovascular: Negative.   Gastrointestinal: Negative.   Endocrine: Negative.   Genitourinary: Negative.   Musculoskeletal: Positive for back pain.       Muscle cramps  Skin: Negative.   Allergic/Immunologic: Negative.   Neurological: Positive for dizziness.  Hematological: Bruises/bleeds easily.  Psychiatric/Behavioral: The patient is nervous/anxious.     Objective:  Neurologic Exam  Physical Exam Physical Examination:   Filed Vitals:   11/13/13 1434  BP: 136/72  Pulse: 52  Temp: 98.3 F (36.8 C)    General Examination: The patient is a very pleasant 80 y.o. male in no acute distress. He appears well-developed and well-nourished and well groomed.   HEENT exam: Normocephalic, atraumatic, pupils are equal, round and reactive to light and accommodation. Extraocular  tracking is good without nystagmus. Hearing is intact. Neck is supple with full range of motion. He has no lip, or jaw tremor, but he has a head tremor, which is new. Oropharynx shows mild to moderate mouth dryness, with a mildly narrow appearing airway. Tonsils are absent. Tongue protrudes centrally and palate elevates symmetrically.  Chest is clear to auscultation without any wheezing or rhonchi.  Heart sounds are normal without murmurs, rubs or gallops.  Abdomen is soft, nontender with normal bowel sounds appreciated.  He has no pitting edema in the distal lower extremities bilaterally. He has some scars from where his vessels were harvested in his distal legs.  Neurologically: Mental status: The patient is awake, alert and oriented in all 3 spheres. His memory, attention, language and knowledge are appropriate. Cranial nerves II through XII are as described under HEENT exam. In addition, shoulder strength is good. Motor exam: Normal bulk, strength and tone is noted. He has a subtle intermittent resting tremor in the right upper extremity and a mild intermittent resting tremor in the left upper extremity, he has a mild postural tremor in both upper extremities, L more than R. He has a mild action tremor in the right upper extremity and mild to moderate action tremor in the left upper extremity. Fine motor skills are fairly well-preserved with respect to finger taps (difficulty with L due to prior injury/surgery) hand movements and rapid alternating patting. There is no decrement in amplitude. Foot taps and foot agility is normal. Reflexes are 1-2+ throughout. On cerebellar testing he has no gait or truncal ataxia and finger to nose testing shows no dysmetria or intention tremor. Romberg testing shows minimal swaying. But he is able to stand with narrow with stance. His walking shows a slight degree of caution that he has preserved arm swing and walks with regular stride length. His posture is  age-appropriate. He has trouble with tandem walk. He turns well without needing extra steps. Sensory exam is intact  to light touch, pinprick, vibration and temperature sense.   Assessment and Plan:   In summary, Bilal Manzer. is a very pleasant 80 year old male with a history of with a bilateral upper extremity tremor, L worse than R. His Hx and exam continue to be most c/w ET, with stable findings. He continues to be on low dose beta blocker and his pulse is again low at 52. I do not think it is safe to increase his metoprolol and I am reluctant to try him on primidone, for fear of exacerbating his dizziness and balance problems and sedating him. He is at risk for side effects given his age as well. Since he has been stable I can see him back in 6 months. I reassured him that I did not detect any parkinsonian signs. He is advised to keep well hydrated and change positions slowly and avoid climbing on ladders. He is advised to increase his water intake. He has a prior history of subdural hematoma which we watched and followed with CAT scan but it improved in April. He has not had any new symptoms but does report a short left episode of right-sided temporal headache. This has resolved on its own. If he has return of his headache we will have a lower threshold of doing another CAT scan. At this juncture, his exam is stable and largely nonfocal. I answered all his questions and he was in agreement with the plan.

## 2013-11-13 NOTE — Patient Instructions (Addendum)
You need to drink more water.   Please remember, that vertigo can recur without warning. It can last hours or days. Please change positions slowly and always stay well-hydrated. Physical therapy with particular attention to vestibular rehabilitation can be very helpful. While there is no specific medication that helps with vertigo, some people get relief with as needed use of meclizine. Certain medications can exacerbate vertigo.   No changes to your medications from my end of things. Your tremor is stable.

## 2013-11-30 ENCOUNTER — Ambulatory Visit: Payer: Self-pay | Admitting: Internal Medicine

## 2013-12-13 DIAGNOSIS — M545 Low back pain, unspecified: Secondary | ICD-10-CM | POA: Diagnosis not present

## 2013-12-13 DIAGNOSIS — M999 Biomechanical lesion, unspecified: Secondary | ICD-10-CM | POA: Diagnosis not present

## 2013-12-13 DIAGNOSIS — IMO0002 Reserved for concepts with insufficient information to code with codable children: Secondary | ICD-10-CM | POA: Diagnosis not present

## 2013-12-18 DIAGNOSIS — M545 Low back pain, unspecified: Secondary | ICD-10-CM | POA: Diagnosis not present

## 2013-12-18 DIAGNOSIS — M999 Biomechanical lesion, unspecified: Secondary | ICD-10-CM | POA: Diagnosis not present

## 2013-12-18 DIAGNOSIS — IMO0002 Reserved for concepts with insufficient information to code with codable children: Secondary | ICD-10-CM | POA: Diagnosis not present

## 2013-12-20 DIAGNOSIS — IMO0002 Reserved for concepts with insufficient information to code with codable children: Secondary | ICD-10-CM | POA: Diagnosis not present

## 2013-12-20 DIAGNOSIS — M545 Low back pain, unspecified: Secondary | ICD-10-CM | POA: Diagnosis not present

## 2013-12-20 DIAGNOSIS — M999 Biomechanical lesion, unspecified: Secondary | ICD-10-CM | POA: Diagnosis not present

## 2013-12-25 DIAGNOSIS — M545 Low back pain, unspecified: Secondary | ICD-10-CM | POA: Diagnosis not present

## 2013-12-25 DIAGNOSIS — IMO0002 Reserved for concepts with insufficient information to code with codable children: Secondary | ICD-10-CM | POA: Diagnosis not present

## 2013-12-25 DIAGNOSIS — M999 Biomechanical lesion, unspecified: Secondary | ICD-10-CM | POA: Diagnosis not present

## 2013-12-27 DIAGNOSIS — M545 Low back pain, unspecified: Secondary | ICD-10-CM | POA: Diagnosis not present

## 2013-12-27 DIAGNOSIS — IMO0002 Reserved for concepts with insufficient information to code with codable children: Secondary | ICD-10-CM | POA: Diagnosis not present

## 2013-12-27 DIAGNOSIS — M999 Biomechanical lesion, unspecified: Secondary | ICD-10-CM | POA: Diagnosis not present

## 2014-01-01 ENCOUNTER — Encounter: Payer: Self-pay | Admitting: Internal Medicine

## 2014-01-01 ENCOUNTER — Ambulatory Visit (INDEPENDENT_AMBULATORY_CARE_PROVIDER_SITE_OTHER): Payer: Medicare Other | Admitting: Internal Medicine

## 2014-01-01 VITALS — BP 114/66 | HR 52 | Temp 98.1°F | Resp 16 | Ht 66.5 in | Wt 168.2 lb

## 2014-01-01 DIAGNOSIS — R7309 Other abnormal glucose: Secondary | ICD-10-CM | POA: Diagnosis not present

## 2014-01-01 DIAGNOSIS — I10 Essential (primary) hypertension: Secondary | ICD-10-CM | POA: Diagnosis not present

## 2014-01-01 DIAGNOSIS — E559 Vitamin D deficiency, unspecified: Secondary | ICD-10-CM | POA: Diagnosis not present

## 2014-01-01 DIAGNOSIS — M545 Low back pain, unspecified: Secondary | ICD-10-CM | POA: Diagnosis not present

## 2014-01-01 DIAGNOSIS — Z79899 Other long term (current) drug therapy: Secondary | ICD-10-CM | POA: Insufficient documentation

## 2014-01-01 DIAGNOSIS — E782 Mixed hyperlipidemia: Secondary | ICD-10-CM | POA: Diagnosis not present

## 2014-01-01 DIAGNOSIS — M999 Biomechanical lesion, unspecified: Secondary | ICD-10-CM | POA: Diagnosis not present

## 2014-01-01 DIAGNOSIS — IMO0002 Reserved for concepts with insufficient information to code with codable children: Secondary | ICD-10-CM | POA: Diagnosis not present

## 2014-01-01 LAB — CBC WITH DIFFERENTIAL/PLATELET
Basophils Absolute: 0 10*3/uL (ref 0.0–0.1)
Basophils Relative: 0 % (ref 0–1)
Eosinophils Absolute: 0.3 10*3/uL (ref 0.0–0.7)
Eosinophils Relative: 4 % (ref 0–5)
HCT: 44.1 % (ref 39.0–52.0)
Hemoglobin: 15.3 g/dL (ref 13.0–17.0)
Lymphocytes Relative: 39 % (ref 12–46)
Lymphs Abs: 2.7 10*3/uL (ref 0.7–4.0)
MCH: 31.4 pg (ref 26.0–34.0)
MCHC: 34.7 g/dL (ref 30.0–36.0)
MCV: 90.6 fL (ref 78.0–100.0)
Monocytes Absolute: 0.6 10*3/uL (ref 0.1–1.0)
Monocytes Relative: 9 % (ref 3–12)
Neutro Abs: 3.3 10*3/uL (ref 1.7–7.7)
Neutrophils Relative %: 48 % (ref 43–77)
Platelets: 222 10*3/uL (ref 150–400)
RBC: 4.87 MIL/uL (ref 4.22–5.81)
RDW: 14.4 % (ref 11.5–15.5)
WBC: 6.8 10*3/uL (ref 4.0–10.5)

## 2014-01-01 NOTE — Progress Notes (Signed)
Patient ID: Paul Frazier, male   DOB: July 19, 1933, 80 y.o.   MRN: [id number]    This very nice 80 y.o.MWM presents for 3 month follow up with Hypertension, Hyperlipidemia, Pre-Diabetes and Vitamin D Deficiency. Patient also has Hx/o elevated PSA known several years and has declined Urology referrals.   HTN predates since the 1980's. BP has been controlled at home. Today's BP: 114/66 mmHg. Patient has ASCAD and underwent CABG in 1986. Patient is followed by Dr Sallyanne Kuster. Patient denies any cardiac type chest pain, palpitations, dyspnea/orthopnea/PND, dizziness, claudication, or dependent edema. Patient does have Does have Hx/o Stage 2 CKD w/ GFR 71 ml/min.   Hyperlipidemia is controlled with diet & meds. Last lipids in Feb as below were at goal. Patient denies myalgias or other med SE's.  Lab Results  Component Value Date   CHOL 185 08/22/2013   HDL 48 08/22/2013   LDLCALC 117* 08/22/2013   TRIG 99 08/22/2013   CHOLHDL 3.9 08/22/2013    Also, the patient has history of PreDiabetes with A1c 5.8% in June 2013 and last A1c was 5.5% in Aug 2014. Patient denies any symptoms of reactive hypoglycemia, diabetic polys, paresthesias or visual blurring.   Further, Patient has history of Vitamin D Deficiency of 40 in June 2013 with last vitamin D of  75 in ASug 2014. Patient supplements vitamin D without any suspected side-effects.    Medication List   aspirin 81 MG tablet  Take 81 mg by mouth daily.     buPROPion 300 MG 24 hr tablet  Commonly known as:  WELLBUTRIN XL  TAKE ONE TABLET BY MOUTH EVERY DAY FOR  MOOD     diazepam 5 MG tablet  Commonly known as:  VALIUM  TAKE 1/2-1 TAB THREE TIMES A DAY AS NEEDED FOR MUSCLE SPASM     Digestive Enzymes Caps  Take 1 capsule by mouth as needed.     fish oil-omega-3 fatty acids 1000 MG capsule  Take 1 g by mouth daily. Take 3 daily     MAGNESIUM DR PO  Take by mouth daily.     metoprolol succinate 25 MG 24 hr tablet  Commonly known as:  TOPROL XL  Take 1  tablet (25 mg total) by mouth daily.     nitroGLYCERIN 0.4 MG SL tablet  Commonly known as:  NITROSTAT  Place 0.4 mg under the tongue every 5 (five) minutes as needed for chest pain.     POMEGRANATE PO  Take 1 tablet by mouth daily.     pravastatin 40 MG tablet  Commonly known as:  PRAVACHOL  Take 1 tablet (40 mg total) by mouth every evening.     PROSTATE Tabs  Take 2 tablets by mouth.        Allergies  Allergen Reactions  . Erythromycin Nausea Only   PMHx:   Past Medical History  Diagnosis Date  . Arthritis   . Hypertension   . CAD (coronary artery disease)   . Old myocardial infarct   . S/P CABG x 5 08/29/1984    LIMA to LAD,SVG to diagonal,SVG to OM,SVG to PDA and PLA  . Tremor, essential   . Hyperlipidemia    FHx:    Reviewed / unchanged  SHx:    Reviewed / unchanged   Systems Review: Constitutional: Denies fever, chills, wt changes, headaches, insomnia, fatigue, night sweats, change in appetite. Eyes: Denies redness, blurred vision, diplopia, discharge, itchy, watery eyes.  ENT: Denies discharge, congestion, post nasal drip, epistaxis, sore  throat, earache, hearing loss, dental pain, tinnitus, vertigo, sinus pain, snoring.  CV: Denies chest pain, palpitations, irregular heartbeat, syncope, dyspnea, diaphoresis, orthopnea, PND, claudication or edema. Respiratory: denies cough, dyspnea, DOE, pleurisy, hoarseness, laryngitis, wheezing.  Gastrointestinal: Denies dysphagia, odynophagia, heartburn, reflux, water brash, abdominal pain or cramps, nausea, vomiting, bloating, diarrhea, constipation, hematemesis, melena, hematochezia  or hemorrhoids. Genitourinary: Denies dysuria, frequency, urgency, nocturia, hesitancy, discharge, hematuria or flank pain. Musculoskeletal: Denies arthralgias, myalgias, stiffness, jt. swelling, pain, limping or strain/sprain.  Skin: Denies pruritus, rash, hives, warts, acne, eczema or change in skin lesion(s). Neuro: No weakness, tremor,  incoordination, spasms, paresthesia or pain. Psychiatric: Denies confusion, memory loss or sensory loss. Endo: Denies change in weight, skin or hair change.  Heme/Lymph: No excessive bleeding, bruising or enlarged lymph nodes.  Exam:  BP 114/66  Pulse 52  Temp 98.1 F   Resp 16  Ht 5' 6.5"   Wt 168 lb 3.2 oz   BMI 26.74 kg/m2  Appears well nourished - in no distress. Eyes: PERRLA, EOMs, conjunctiva no swelling or erythema. Sinuses: No frontal/maxillary tenderness ENT/Mouth: EAC's clear, TM's nl w/o erythema, bulging. Nares clear w/o erythema, swelling, exudates. Oropharynx clear without erythema or exudates. Oral hygiene is good. Tongue normal, non obstructing. Hearing intact.  Neck: Supple. Thyroid nl. Car 2+/2+ without bruits, nodes or JVD. Chest: Respirations nl with BS clear & equal w/o rales, rhonchi, wheezing or stridor.  Cor: Heart sounds normal w/ regular rate and rhythm without sig. murmurs, gallops, clicks, or rubs. Peripheral pulses normal and equal  without edema.  Abdomen: Soft & bowel sounds normal. Non-tender w/o guarding, rebound, hernias, masses, or organomegaly.  Lymphatics: Unremarkable.  Musculoskeletal: Full ROM all peripheral extremities, joint stability, 5/5 strength, and normal gait.  Skin: Warm, dry without exposed rashes, lesions or ecchymosis apparent.  Neuro: Cranial nerves intact, reflexes equal bilaterally. Sensory-motor testing grossly intact. Tendon reflexes grossly intact.  Pysch: Alert & oriented x 3. Insight and judgement nl & appropriate. No ideations.  Assessment and Plan:  1. Hypertension - Continue monitor blood pressure at home. Continue diet/meds same.  2. ASCAD s/p CABG  3. Pre-diabetes/Insulin Resistance - Continue diet, exercise, lifestyle modifications. Monitor appropriate labs.  4. Vitamin D Deficiency - Continue supplementation.  5. Hyperlipidemia - Continue diet/meds, exercise,& lifestyle modifications. Continue monitor periodic  cholesterol/liver & renal functions   6. Elevated PSA  (9.97 - Refuses Urology Referral)  Recommended regular exercise, BP monitoring, weight control, and discussed med and SE's. Recommended labs to assess and monitor clinical status. Further disposition pending results of labs.

## 2014-01-02 LAB — HEPATIC FUNCTION PANEL
ALT: 23 U/L (ref 0–53)
AST: 22 U/L (ref 0–37)
Albumin: 3.9 g/dL (ref 3.5–5.2)
Alkaline Phosphatase: 64 U/L (ref 39–117)
Bilirubin, Direct: 0.1 mg/dL (ref 0.0–0.3)
Indirect Bilirubin: 0.4 mg/dL (ref 0.2–1.2)
Total Bilirubin: 0.5 mg/dL (ref 0.2–1.2)
Total Protein: 6.4 g/dL (ref 6.0–8.3)

## 2014-01-02 LAB — BASIC METABOLIC PANEL WITH GFR
BUN: 12 mg/dL (ref 6–23)
CO2: 26 mEq/L (ref 19–32)
Calcium: 9.1 mg/dL (ref 8.4–10.5)
Chloride: 105 mEq/L (ref 96–112)
Creat: 0.85 mg/dL (ref 0.50–1.35)
GFR, Est African American: >89 mL/min
GFR, Est Non African American: 83 mL/min
Glucose, Bld: 107 mg/dL — ABNORMAL HIGH (ref 70–99)
Potassium: 4 mEq/L (ref 3.5–5.3)
Sodium: 140 mEq/L (ref 135–145)

## 2014-01-02 LAB — LIPID PANEL
Cholesterol: 166 mg/dL (ref 0–200)
HDL: 42 mg/dL (ref >39)
LDL Cholesterol: 96 mg/dL (ref 0–99)
Total CHOL/HDL Ratio: 4 Ratio
Triglycerides: 141 mg/dL (ref <150)
VLDL: 28 mg/dL (ref 0–40)

## 2014-01-02 LAB — VITAMIN D 25 HYDROXY (VIT D DEFICIENCY, FRACTURES): Vit D, 25-Hydroxy: 61 ng/mL (ref 30–89)

## 2014-01-02 LAB — TSH: TSH: 0.586 u[IU]/mL (ref 0.350–4.500)

## 2014-01-02 LAB — INSULIN, FASTING: Insulin fasting, serum: 82 u[IU]/mL — ABNORMAL HIGH (ref 3–28)

## 2014-01-02 LAB — HEMOGLOBIN A1C
Hgb A1c MFr Bld: 5.9 % — ABNORMAL HIGH (ref <5.7)
Mean Plasma Glucose: 123 mg/dL — ABNORMAL HIGH (ref <117)

## 2014-01-02 LAB — MAGNESIUM: Magnesium: 1.8 mg/dL (ref 1.5–2.5)

## 2014-01-03 ENCOUNTER — Other Ambulatory Visit: Payer: Self-pay | Admitting: Emergency Medicine

## 2014-01-03 DIAGNOSIS — M999 Biomechanical lesion, unspecified: Secondary | ICD-10-CM | POA: Diagnosis not present

## 2014-01-03 DIAGNOSIS — M545 Low back pain, unspecified: Secondary | ICD-10-CM | POA: Diagnosis not present

## 2014-01-03 DIAGNOSIS — IMO0002 Reserved for concepts with insufficient information to code with codable children: Secondary | ICD-10-CM | POA: Diagnosis not present

## 2014-01-08 DIAGNOSIS — M999 Biomechanical lesion, unspecified: Secondary | ICD-10-CM | POA: Diagnosis not present

## 2014-01-08 DIAGNOSIS — M545 Low back pain, unspecified: Secondary | ICD-10-CM | POA: Diagnosis not present

## 2014-01-08 DIAGNOSIS — IMO0002 Reserved for concepts with insufficient information to code with codable children: Secondary | ICD-10-CM | POA: Diagnosis not present

## 2014-01-10 DIAGNOSIS — M999 Biomechanical lesion, unspecified: Secondary | ICD-10-CM | POA: Diagnosis not present

## 2014-01-10 DIAGNOSIS — IMO0002 Reserved for concepts with insufficient information to code with codable children: Secondary | ICD-10-CM | POA: Diagnosis not present

## 2014-01-10 DIAGNOSIS — M545 Low back pain, unspecified: Secondary | ICD-10-CM | POA: Diagnosis not present

## 2014-01-22 DIAGNOSIS — M545 Low back pain, unspecified: Secondary | ICD-10-CM | POA: Diagnosis not present

## 2014-01-22 DIAGNOSIS — M999 Biomechanical lesion, unspecified: Secondary | ICD-10-CM | POA: Diagnosis not present

## 2014-01-22 DIAGNOSIS — IMO0002 Reserved for concepts with insufficient information to code with codable children: Secondary | ICD-10-CM | POA: Diagnosis not present

## 2014-01-28 ENCOUNTER — Other Ambulatory Visit: Payer: Self-pay | Admitting: Cardiovascular Disease

## 2014-01-28 DIAGNOSIS — E291 Testicular hypofunction: Secondary | ICD-10-CM | POA: Diagnosis not present

## 2014-01-28 DIAGNOSIS — Z79899 Other long term (current) drug therapy: Secondary | ICD-10-CM | POA: Diagnosis not present

## 2014-01-28 DIAGNOSIS — E785 Hyperlipidemia, unspecified: Secondary | ICD-10-CM | POA: Diagnosis not present

## 2014-01-28 LAB — COMPREHENSIVE METABOLIC PANEL
ALT: 21 U/L (ref 0–53)
AST: 19 U/L (ref 0–37)
Albumin: 4.2 g/dL (ref 3.5–5.2)
Alkaline Phosphatase: 63 U/L (ref 39–117)
BUN: 20 mg/dL (ref 6–23)
CO2: 26 mEq/L (ref 19–32)
Calcium: 8.9 mg/dL (ref 8.4–10.5)
Chloride: 106 mEq/L (ref 96–112)
Creat: 1.02 mg/dL (ref 0.50–1.35)
Glucose, Bld: 97 mg/dL (ref 70–99)
Potassium: 4.5 mEq/L (ref 3.5–5.3)
Sodium: 139 mEq/L (ref 135–145)
Total Bilirubin: 0.4 mg/dL (ref 0.2–1.2)
Total Protein: 6.5 g/dL (ref 6.0–8.3)

## 2014-01-28 LAB — LIPID PANEL
Cholesterol: 172 mg/dL (ref 0–200)
HDL: 46 mg/dL (ref >39)
LDL Cholesterol: 110 mg/dL — ABNORMAL HIGH (ref 0–99)
Total CHOL/HDL Ratio: 3.7 Ratio
Triglycerides: 79 mg/dL (ref <150)
VLDL: 16 mg/dL (ref 0–40)

## 2014-01-29 DIAGNOSIS — M999 Biomechanical lesion, unspecified: Secondary | ICD-10-CM | POA: Diagnosis not present

## 2014-01-29 DIAGNOSIS — IMO0002 Reserved for concepts with insufficient information to code with codable children: Secondary | ICD-10-CM | POA: Diagnosis not present

## 2014-01-29 DIAGNOSIS — M545 Low back pain, unspecified: Secondary | ICD-10-CM | POA: Diagnosis not present

## 2014-01-29 LAB — TESTOSTERONE: Testosterone: 449 ng/dL (ref 300–890)

## 2014-02-05 DIAGNOSIS — M545 Low back pain, unspecified: Secondary | ICD-10-CM | POA: Diagnosis not present

## 2014-02-05 DIAGNOSIS — IMO0002 Reserved for concepts with insufficient information to code with codable children: Secondary | ICD-10-CM | POA: Diagnosis not present

## 2014-02-05 DIAGNOSIS — M999 Biomechanical lesion, unspecified: Secondary | ICD-10-CM | POA: Diagnosis not present

## 2014-04-08 ENCOUNTER — Encounter: Payer: Self-pay | Admitting: Physician Assistant

## 2014-04-08 ENCOUNTER — Ambulatory Visit (INDEPENDENT_AMBULATORY_CARE_PROVIDER_SITE_OTHER): Payer: Medicare Other | Admitting: Physician Assistant

## 2014-04-08 VITALS — BP 128/68 | HR 64 | Temp 98.1°F | Resp 16 | Ht 66.5 in | Wt 166.0 lb

## 2014-04-08 DIAGNOSIS — I10 Essential (primary) hypertension: Secondary | ICD-10-CM

## 2014-04-08 DIAGNOSIS — Z0000 Encounter for general adult medical examination without abnormal findings: Secondary | ICD-10-CM | POA: Diagnosis not present

## 2014-04-08 DIAGNOSIS — I2589 Other forms of chronic ischemic heart disease: Secondary | ICD-10-CM

## 2014-04-08 DIAGNOSIS — Z789 Other specified health status: Secondary | ICD-10-CM

## 2014-04-08 DIAGNOSIS — R7309 Other abnormal glucose: Secondary | ICD-10-CM | POA: Diagnosis not present

## 2014-04-08 DIAGNOSIS — Z1331 Encounter for screening for depression: Secondary | ICD-10-CM | POA: Diagnosis not present

## 2014-04-08 DIAGNOSIS — Z79899 Other long term (current) drug therapy: Secondary | ICD-10-CM | POA: Diagnosis not present

## 2014-04-08 DIAGNOSIS — I2510 Atherosclerotic heart disease of native coronary artery without angina pectoris: Secondary | ICD-10-CM

## 2014-04-08 DIAGNOSIS — I255 Ischemic cardiomyopathy: Secondary | ICD-10-CM

## 2014-04-08 DIAGNOSIS — E559 Vitamin D deficiency, unspecified: Secondary | ICD-10-CM

## 2014-04-08 DIAGNOSIS — E782 Mixed hyperlipidemia: Secondary | ICD-10-CM | POA: Diagnosis not present

## 2014-04-08 LAB — CBC WITH DIFFERENTIAL/PLATELET
Basophils Absolute: 0 10*3/uL (ref 0.0–0.1)
Basophils Relative: 0 % (ref 0–1)
Eosinophils Absolute: 0.2 10*3/uL (ref 0.0–0.7)
Eosinophils Relative: 2 % (ref 0–5)
HCT: 45.6 % (ref 39.0–52.0)
Hemoglobin: 16 g/dL (ref 13.0–17.0)
Lymphocytes Relative: 31 % (ref 12–46)
Lymphs Abs: 2.4 10*3/uL (ref 0.7–4.0)
MCH: 31.9 pg (ref 26.0–34.0)
MCHC: 35.1 g/dL (ref 30.0–36.0)
MCV: 90.8 fL (ref 78.0–100.0)
Monocytes Absolute: 0.9 10*3/uL (ref 0.1–1.0)
Monocytes Relative: 11 % (ref 3–12)
Neutro Abs: 4.4 10*3/uL (ref 1.7–7.7)
Neutrophils Relative %: 56 % (ref 43–77)
Platelets: 217 10*3/uL (ref 150–400)
RBC: 5.02 MIL/uL (ref 4.22–5.81)
RDW: 13.8 % (ref 11.5–15.5)
WBC: 7.9 10*3/uL (ref 4.0–10.5)

## 2014-04-08 LAB — HEPATIC FUNCTION PANEL
ALT: 19 U/L (ref 0–53)
AST: 18 U/L (ref 0–37)
Albumin: 4.3 g/dL (ref 3.5–5.2)
Alkaline Phosphatase: 69 U/L (ref 39–117)
Bilirubin, Direct: 0.1 mg/dL (ref 0.0–0.3)
Indirect Bilirubin: 0.5 mg/dL (ref 0.2–1.2)
Total Bilirubin: 0.6 mg/dL (ref 0.2–1.2)
Total Protein: 6.7 g/dL (ref 6.0–8.3)

## 2014-04-08 LAB — LIPID PANEL
Cholesterol: 158 mg/dL (ref 0–200)
HDL: 44 mg/dL (ref >39)
LDL Cholesterol: 93 mg/dL (ref 0–99)
Total CHOL/HDL Ratio: 3.6 Ratio
Triglycerides: 107 mg/dL (ref <150)
VLDL: 21 mg/dL (ref 0–40)

## 2014-04-08 LAB — BASIC METABOLIC PANEL WITH GFR
BUN: 19 mg/dL (ref 6–23)
CO2: 30 mEq/L (ref 19–32)
Calcium: 9.4 mg/dL (ref 8.4–10.5)
Chloride: 103 mEq/L (ref 96–112)
Creat: 0.96 mg/dL (ref 0.50–1.35)
GFR, Est African American: 87 mL/min
GFR, Est Non African American: 75 mL/min
Glucose, Bld: 80 mg/dL (ref 70–99)
Potassium: 4.3 mEq/L (ref 3.5–5.3)
Sodium: 139 mEq/L (ref 135–145)

## 2014-04-08 LAB — TSH: TSH: 0.498 u[IU]/mL (ref 0.350–4.500)

## 2014-04-08 LAB — HEMOGLOBIN A1C
Hgb A1c MFr Bld: 6.1 % — ABNORMAL HIGH (ref <5.7)
Mean Plasma Glucose: 128 mg/dL — ABNORMAL HIGH (ref <117)

## 2014-04-08 LAB — MAGNESIUM: Magnesium: 2 mg/dL (ref 1.5–2.5)

## 2014-04-08 NOTE — Progress Notes (Addendum)
MEDICARE ANNUAL WELLNESS VISIT AND FOLLOW UP Assessment:   1. Coronary artery disease involving native coronary artery of native heart without angina pectoris Control blood pressure, cholesterol, glucose, increase exercise.  Has NTG but does not use.   2. Cardiomyopathy, ischemic Weight is stable, no orthopnea, PND, edema.  Continue cardio follow up Control blood pressure, cholesterol, glucose, increase exercise.   3. Hypertension - continue medications, DASH diet, exercise and monitor at home. Call if greater than 130/80.  - CBC with Differential - BASIC METABOLIC PANEL WITH GFR - Hepatic function panel - TSH  4. Encounter for long-term (current) use of other medications - Magnesium  5. Hyperlipidemia -continue medications, check lipids, decrease fatty foods, increase activity. - Lipid panel  6. PreDiabetes Discussed general issues about diabetes pathophysiology and management., Educational material distributed., Suggested low cholesterol diet., Encouraged aerobic exercise., Discussed foot care., Reminded to get yearly retinal exam. - Hemoglobin A1c - HM DIABETES FOOT EXAM  7. Vitamin D Deficiency Continue supplement  8. Routine general medical examination at a health care facility  9. Screening for depression Negative/Remission  10. Patient had no falls in past year    Plan:   During the course of the visit the patient was educated and counseled about appropriate screening and preventive services including:    Pneumococcal vaccine   Influenza vaccine  Hepatitis B vaccine  Screening electrocardiogram  Colorectal cancer screening  Diabetes screening  Glaucoma screening  Nutrition counseling   Screening recommendations, referrals: Vaccinations: Tdap vaccine not indicated Influenza vaccine declined Pneumococcal vaccine declined Shingles vaccine declined Hep B vaccine not indicated  Nutrition assessed and recommended  Colonoscopy  declined Recommended yearly ophthalmology/optometry visit for glaucoma screening and checkup Recommended yearly dental visit for hygiene and checkup Advanced directives - requested  Conditions/risks identified: BMI: Discussed weight loss, diet, and increase physical activity.  Increase physical activity: AHA recommends 150 minutes of physical activity a week.  Medications reviewed Urinary Incontinence is not an issue: discussed non pharmacology and pharmacology options.  Fall risk: low- discussed PT, home fall assessment, medications.    Subjective:  Paul Frazier is a 80 y.o. male who presents for Medicare Annual Wellness Visit and 3 month follow up for HTN, hyperlipidemia, prediabetes, and vitamin D Def.  Date of last medicare wellness visit was is unknown.  His blood pressure has been controlled at home, today their BP is BP: 128/68 mmHg He does not workout but he takes care of his wife, does all of the cooking, cleaning and taking her to the doctor. He denies chest pain, shortness of breath, dizziness.  He is not on cholesterol medication and denies myalgias. His cholesterol is at goal. The cholesterol last visit was:   Lab Results  Component Value Date   CHOL 172 01/28/2014   HDL 46 01/28/2014   LDLCALC 110* 01/28/2014   TRIG 79 01/28/2014   CHOLHDL 3.7 01/28/2014   He has been working on diet and exercise for prediabetes, he has stage 2 CKD, and denies paresthesia of the feet, polydipsia and polyuria. Last A1C in the office was:  Lab Results  Component Value Date   HGBA1C 5.9* 01/01/2014   Patient is on Vitamin D supplement.   Lab Results  Component Value Date   VD25OH 1 01/01/2014     He has history of CAD with CABG in 1986 with last myoview stress test being in 2014, he follows with Dr. Sallyanne Kuster. He has NTG but does not use it.  Some lower back pain  with occ right foot numbness.    Names of Other Physician/Practitioners you currently use: 1. [facility] here for primary care 2. [address], eye doctor, last visit 1 year 3. none, dentist Patient Care Team: Unk Pinto, MD as PCP - General (Internal Medicine) [name] Age, MD as Attending Physician (Neurology) Sanda Klein, MD as Consulting Physician (Cardiology)  Medication Review: Current Outpatient Prescriptions on File Prior to Visit  Medication Sig Dispense Refill  . aspirin 81 MG tablet Take 81 mg by mouth daily.      [name] buPROPion (WELLBUTRIN XL) 300 MG 24 hr tablet TAKE ONE TABLET BY MOUTH ONCE DAILY FOR  MOOD  30 tablet  2  . diazepam (VALIUM) 5 MG tablet TAKE 1/2-1 TAB THREE TIMES A DAY AS NEEDED FOR MUSCLE SPASM  90 tablet  0  . Digestive Enzymes CAPS Take 1 capsule by mouth as needed.      . fish oil-omega-3 fatty acids 1000 MG capsule Take 1 g by mouth daily. Take 3 daily      . Magnesium Chloride (MAGNESIUM DR PO) Take by mouth daily.      . metoprolol succinate (TOPROL XL) 25 MG 24 hr tablet Take 1 tablet (25 mg total) by mouth daily.  30 tablet  11  . nitroGLYCERIN (NITROSTAT) 0.4 MG SL tablet Place 0.4 mg under the tongue every 5 (five) minutes as needed for chest pain.      [name] Specialty Vitamins Products (PROSTATE) TABS Take 2 tablets by mouth.       No current facility-administered medications on file prior to visit.    Current Problems (verified) Patient Active Problem List   Diagnosis Date Noted  . Hypertension 01/01/2014  . Vitamin D Deficiency 01/01/2014  . Encounter for long-term (current) use of other medications 01/01/2014  . PreDiabetes 01/01/2014  . Nonsustained ventricular tachycardia 05/23/2013  . CAD (coronary artery disease) 01/08/2013  . Cardiomyopathy, ischemic 01/08/2013  . Hyperlipidemia 01/08/2013  . LBBB (left bundle branch block) 01/08/2013  . Fatigue 01/08/2013    Screening Tests Health Maintenance  Topic Date Due  . Tetanus/tdap  07/23/1953  . Colonoscopy  07/23/1984  . Zostavax  07/23/1994  . Pneumococcal  Polysaccharide Vaccine Age 29 And Over  07/24/1999  . Influenza Vaccine  02/16/2014    Preventative care: Last colonoscopy: [place]   Prior vaccinations: TD or Tdap: 2011  Influenza: declines Pneumococcal: declines Shingles/Zostavax: declines  History reviewed: allergies, current medications, past family history, past medical history, past social history, past surgical history and problem list   Risk Factors: Tobacco History  Substance Use Topics  . Smoking status: Former Smoker    Start date: 07/19/1970  . Smokeless tobacco: Never Used  . Alcohol Use: No   He does not smoke.  Patient is a former smoker. Are there smokers in your home (other than you)?  No  Alcohol Current alcohol use: none  Caffeine Current caffeine use: coffee 2 /day  Exercise Current exercise: none  Nutrition/Diet Current diet: in general, a "healthy" diet    Cardiac risk factors: advanced age (older than 80 for men, 8 for women), dyslipidemia, family history of premature cardiovascular disease, hypertension, male gender and sedentary lifestyle.  Depression Screen (Note: if answer to either of the following is "Yes", a more complete depression screening is indicated)   Q1: Over the past two weeks, have you felt down, depressed or hopeless? No  Q2: Over the past two weeks, have you felt  little interest or pleasure in doing things? No  Have you lost interest or pleasure in daily life? No  Do you often feel hopeless? No  Do you cry easily over simple problems? No  Activities of Daily Living In your present state of health, do you have any difficulty performing the following activities?:  Driving? No Managing money?  No Feeding yourself? No Getting from bed to chair? No Climbing a flight of stairs? No Preparing food and eating?: No Bathing or showering? No Getting dressed: No Getting to the toilet? No Using the toilet:No Moving around from place to place: No In the past year  have you fallen or had a near fall?:No   Are you sexually active?  No  Do you have more than one partner?  No  Vision Difficulties: No  Hearing Difficulties: Yes Do you often ask people to speak up or repeat themselves? Yes Do you experience ringing or noises in your ears? No Do you have difficulty understanding soft or whispered voices? Yes  Cognition  Do you feel that you have a problem with memory?No  Do you often misplace items? No  Do you feel safe at home?  Yes  Advanced directives Does patient have a [place]? Yes Does patient have a Living Will? Yes   Objective:   Blood pressure 128/68, pulse 64, temperature 98.1 F (36.7 C), resp. rate 16, height 5' 6.5" (1.689 m), weight 166 lb (75.297 kg). Body mass index is 26.39 kg/(m^2).  General appearance: alert, no distress, WD/WN, male Cognitive Testing  Alert? Yes  Normal Appearance?Yes  Oriented to person? Yes  Place? Yes   Time? Yes  Recall of three objects?  Yes  Can perform simple calculations? Yes  Displays appropriate judgment?Yes  Can read the correct time from a watch face?Yes  HEENT: normocephalic, sclerae anicteric, TMs pearly, nares patent, no discharge or erythema, pharynx normal Oral cavity: MMM, no lesions Neck: supple, no lymphadenopathy, no thyromegaly, no masses Heart: RRR, normal S1, S2, no murmurs Lungs: CTA bilaterally, no wheezes, rhonchi, or rales Abdomen: +bs, soft, non tender, non distended, no masses, no hepatomegaly, no splenomegaly Musculoskeletal: nontender, no swelling, no obvious deformity Extremities: no edema, no cyanosis, no clubbing Pulses: 2+ symmetric, upper and lower extremities, normal cap refill Neurological: alert, oriented x 3, CN2-12 intact, strength normal upper extremities and lower extremities, sensation normal throughout, DTRs 2+ throughout, no cerebellar signs, gait normal Psychiatric: normal affect, behavior normal, pleasant   Medicare  Attestation I have personally reviewed: The patient's medical and social history Their use of alcohol, tobacco or illicit drugs Their current medications and supplements The patient's functional ability including ADLs,fall risks, home safety risks, cognitive, and hearing and visual impairment Diet and physical activities Evidence for depression or mood disorders  The patient's weight, height, BMI, and visual acuity have been recorded in the chart.  I have made referrals, counseling, and provided education to the patient based on review of the above and I have provided the patient with a written personalized care plan for preventive services.     Vicie Mutters, PA-C   04/08/2014

## 2014-04-08 NOTE — Patient Instructions (Addendum)
Use a dropper to put olive oil or canola oil in the effected ear- 2-3 times a week. Let it soak for 20-30 min then you can take a shower or use a baby bulb with warm water to wash out the ear wax.  Do not use Qtips  [employer] with no obligation # [phone] Tues-Sat 10-6  Cologuard is an easy to use noninvasive colon cancer screening test based on the latest advances in stool DNA science.   Colon cancer is 3rd most diagnosed cancer and 2nd leading cause of death in both men and women 78 years of age and older despite being one of the most preventable and treatable cancers if found early. You have agreed to do a Cologuard screening and have declined a colonoscopy in spite of being explained the risks and benefits of the colonoscopy in detail, including cancer and death. Please understand that this is test not as sensitive or specific as a colonoscopy and you are still recommended to get a colonoscopy.   If you are NOT medicare please call your insurance company and given them this CPT code, [phone], in order to see how much your insurance company will cover.   You will receive a short call from [place] support center at [employer], when you receive a call they will say they are from [place],  to confirm your mailing address and give you more information.  When they calll you, it will appear on the caller ID as "[employer]" or in some cases only this number will appear, [phone].   [employer] will ship your collection kit directly to you. You will collect a single stool sample in the privacy of your own home, no special preparation required. You will return the kit via [place] pre-paid shipping or pick-up, in the same box it arrived in. Then I will contact you to discuss your results after I receive them from the laboratory.   If you have any questions or concerns, Cologuard Customer Support Specialist are available 24 hours a  day, 7 days a week at [phone] or go to [email or web address].     Preventative Care for Adults, Male       REGULAR HEALTH EXAMS:  A routine yearly physical is a good way to check in with your primary care provider about your health and preventive screening. It is also an opportunity to share updates about your health and any concerns you have, and receive a thorough all-over exam.   Most health insurance companies pay for at least some preventative services.  Check with your health plan for specific coverages.  WHAT PREVENTATIVE SERVICES DO MEN NEED?  Adult men should have their weight and blood pressure checked regularly.   Men age 37 and older should have their cholesterol levels checked regularly.  Beginning at age 2 and continuing to age 8, men should be screened for colorectal cancer.  Certain people should may need continued testing until age 41.  Other cancer screening may include exams for testicular and prostate cancer.  Updating vaccinations is part of preventative care.  Vaccinations help protect against diseases such as the flu.  Lab tests are generally done as part of preventative care to screen for anemia and blood disorders, to screen for problems with the kidneys and liver, to screen for bladder problems, to check blood sugar, and to check your cholesterol level.  Preventative services generally include counseling about diet, exercise, avoiding tobacco, drugs, excessive alcohol  consumption, and sexually transmitted infections.    GENERAL RECOMMENDATIONS FOR GOOD HEALTH:  Healthy diet:  Eat a variety of foods, including fruit, vegetables, animal or vegetable protein, such as meat, fish, chicken, and eggs, or beans, lentils, tofu, and grains, such as rice.  Drink plenty of water daily.  Decrease saturated fat in the diet, avoid lots of red meat, processed foods, sweets, fast foods, and fried foods.  Exercise:  Aerobic exercise helps maintain good heart  health. At least 30-40 minutes of moderate-intensity exercise is recommended. For example, a brisk walk that increases your heart rate and breathing. This should be done on most days of the week.   Find a type of exercise or a variety of exercises that you enjoy so that it becomes a part of your daily life.  Examples are running, walking, swimming, water aerobics, and biking.  For motivation and support, explore group exercise such as aerobic class, spin class, Zumba, Yoga,or  martial arts, etc.    Set exercise goals for yourself, such as a certain weight goal, walk or run in a race such as a 5k walk/run.  Speak to your primary care provider about exercise goals.  Disease prevention:  If you smoke or chew tobacco, find out from your caregiver how to quit. It can literally save your life, no matter how long you have been a tobacco user. If you do not use tobacco, never begin.   Maintain a healthy diet and normal weight. Increased weight leads to problems with blood pressure and diabetes.   The Body Mass Index or BMI is a way of measuring how much of your body is fat. Having a BMI above 27 increases the risk of heart disease, diabetes, hypertension, stroke and other problems related to obesity. Your caregiver can help determine your BMI and based on it develop an exercise and dietary program to help you achieve or maintain this important measurement at a healthful level.  High blood pressure causes heart and blood vessel problems.  Persistent high blood pressure should be treated with medicine if weight loss and exercise do not work.   Fat and cholesterol leaves deposits in your arteries that can block them. This causes heart disease and vessel disease elsewhere in your body.  If your cholesterol is found to be high, or if you have heart disease or certain other medical conditions, then you may need to have your cholesterol monitored frequently and be treated with medication.   Ask if you should have  a stress test if your history suggests this. A stress test is a test done on a treadmill that looks for heart disease. This test can find disease prior to there being a problem.  Avoid drinking alcohol in excess (more than two drinks per day).  Avoid use of street drugs. Do not share needles with anyone. Ask for professional help if you need assistance or instructions on stopping the use of alcohol, cigarettes, and/or drugs.  Brush your teeth twice a day with fluoride toothpaste, and floss once a day. Good oral hygiene prevents tooth decay and gum disease. The problems can be painful, unattractive, and can cause other health problems. Visit your dentist for a routine oral and dental check up and preventive care every 6-12 months.   Look at your skin regularly.  Use a mirror to look at your back. Notify your caregivers of changes in moles, especially if there are changes in shapes, colors, a size larger than a pencil eraser, an irregular  border, or development of new moles.  Safety:  Use seatbelts 100% of the time, whether driving or as a passenger.  Use safety devices such as hearing protection if you work in environments with loud noise or significant background noise.  Use safety glasses when doing any work that could send debris in to the eyes.  Use a helmet if you ride a bike or motorcycle.  Use appropriate safety gear for contact sports.  Talk to your caregiver about gun safety.  Use sunscreen with a SPF (or skin protection factor) of 15 or greater.  Lighter skinned people are at a greater risk of skin cancer. Don't forget to also wear sunglasses in order to protect your eyes from too much damaging sunlight. Damaging sunlight can accelerate cataract formation.   Practice safe sex. Use condoms. Condoms are used for birth control and to help reduce the spread of sexually transmitted infections (or STIs).  Some of the STIs are gonorrhea (the clap), chlamydia, syphilis, trichomonas, herpes, HPV (human  papilloma virus) and HIV (human immunodeficiency virus) which causes AIDS. The herpes, HIV and HPV are viral illnesses that have no cure. These can result in disability, cancer and death.   Keep carbon monoxide and smoke detectors in your home functioning at all times. Change the batteries every 6 months or use a model that plugs into the wall.   Vaccinations:  Stay up to date with your tetanus shots and other required immunizations. You should have a booster for tetanus every 10 years. Be sure to get your flu shot every year, since 5%-20% of the [place] population comes down with the flu. The flu vaccine changes each year, so being vaccinated once is not enough. Get your shot in the fall, before the flu season peaks.   Other vaccines to consider:  Pneumococcal vaccine to protect against certain types of pneumonia.  This is normally recommended for adults age 53 or older.  However, adults younger than 78 years old with certain underlying conditions such as diabetes, heart or lung disease should also receive the vaccine.  Shingles vaccine to protect against Varicella Zoster if you are older than age 40, or younger than 78 years old with certain underlying illness.  Hepatitis A vaccine to protect against a form of infection of the liver by a virus acquired from food.  Hepatitis B vaccine to protect against a form of infection of the liver by a virus acquired from blood or body fluids, particularly if you work in health care.  If you plan to travel internationally, check with your local health department for specific vaccination recommendations.  Cancer Screening:  Most routine colon cancer screening begins at the age of 59. On a yearly basis, doctors may provide special easy to use take-home tests to check for hidden blood in the stool. Sigmoidoscopy or colonoscopy can detect the earliest forms of colon cancer and is life saving. These tests use a small camera at the end of a tube to directly examine  the colon. Speak to your caregiver about this at age 77, when routine screening begins (and is repeated every 5 years unless early forms of pre-cancerous polyps or small growths are found).   At the age of 64 men usually start screening for prostate cancer every year. Screening may begin at a younger age for those with higher risk. Those at higher risk include African-Americans or having a family history of prostate cancer. There are two types of tests for prostate cancer:   Prostate-specific  antigen (PSA) testing. Recent studies raise questions about prostate cancer using PSA and you should discuss this with your caregiver.   Digital rectal exam (in which your doctor's lubricated and gloved finger feels for enlargement of the prostate through the anus).   Screening for testicular cancer.  Do a monthly exam of your testicles. Gently roll each testicle between your thumb and fingers, feeling for any abnormal lumps. The best time to do this is after a hot shower or bath when the tissues are looser. Notify your caregivers of any lumps, tenderness or changes in size or shape immediately.

## 2014-04-11 ENCOUNTER — Other Ambulatory Visit: Payer: Self-pay | Admitting: Physician Assistant

## 2014-05-02 ENCOUNTER — Telehealth: Payer: Self-pay | Admitting: Neurology

## 2014-05-02 NOTE — Telephone Encounter (Signed)
Patient calling to state that he would like to be tested for ADHD when he comes in to see Dr. Rexene Alberts on 05/16/14. Please return call and advise.

## 2014-05-03 ENCOUNTER — Other Ambulatory Visit: Payer: Self-pay

## 2014-05-03 NOTE — Telephone Encounter (Signed)
I am really not qualified to diagnose ADHD. Patient is encouraged to talk to his primary care physician about a referral to a psychologist for this.

## 2014-05-07 NOTE — Telephone Encounter (Signed)
I spoke to pt and he will talk with his pcp about ADHD.  He verbalized ok.

## 2014-05-16 ENCOUNTER — Telehealth: Payer: Self-pay | Admitting: Neurology

## 2014-05-16 ENCOUNTER — Ambulatory Visit: Payer: Medicare Other | Admitting: Neurology

## 2014-05-16 NOTE — Telephone Encounter (Signed)
Patient was a no show for today's appointment 05/16/14.Paul KitchenMarland KitchenMarland Frazier

## 2014-05-20 ENCOUNTER — Encounter: Payer: Self-pay | Admitting: Neurology

## 2014-05-28 ENCOUNTER — Encounter: Payer: Self-pay | Admitting: Internal Medicine

## 2014-06-04 ENCOUNTER — Ambulatory Visit (INDEPENDENT_AMBULATORY_CARE_PROVIDER_SITE_OTHER): Payer: Medicare Other | Admitting: Neurology

## 2014-06-04 ENCOUNTER — Encounter: Payer: Self-pay | Admitting: Neurology

## 2014-06-04 VITALS — BP 142/71 | HR 61 | Temp 97.7°F | Ht 67.0 in | Wt 169.0 lb

## 2014-06-04 DIAGNOSIS — I252 Old myocardial infarction: Secondary | ICD-10-CM | POA: Diagnosis not present

## 2014-06-04 DIAGNOSIS — Z8679 Personal history of other diseases of the circulatory system: Secondary | ICD-10-CM

## 2014-06-04 DIAGNOSIS — I2510 Atherosclerotic heart disease of native coronary artery without angina pectoris: Secondary | ICD-10-CM

## 2014-06-04 DIAGNOSIS — G250 Essential tremor: Secondary | ICD-10-CM | POA: Diagnosis not present

## 2014-06-04 DIAGNOSIS — Z951 Presence of aortocoronary bypass graft: Secondary | ICD-10-CM | POA: Diagnosis not present

## 2014-06-04 NOTE — Patient Instructions (Addendum)
  Please remember, that any kind of tremor may be exacerbated by anxiety, anger, nervousness, excitement, dehydration, sleep deprivation, by caffeine, and low blood sugar values or blood sugar fluctuations. Some medications, especially some antidepressants and lithium can cause or exacerbate tremors. Tremors may temporarily calm down her subside with the use of a benzodiazepine such as Valium or related medications and with alcohol. Be aware however that drinking alcohol is not an approved treatment or appropriate treatment for tremor control and long-term use of benzodiazepines such as Valium, lorazepam, alprazolam, or clonazepam can cause habit formation, physical and psychological addiction.  We will keep monitoring your tremor. I will see you back as needed.   Drink more water and use a cane as needed. Do not climb ladders or use dangerous equipment.

## 2014-06-04 NOTE — Progress Notes (Signed)
Subjective:    Patient ID: Paul Frazier is a 80 y.o. male.  HPI     Interim history:   Paul Frazier is a very friendly 80 year old right-handed gentleman who has an underlying diagnosis of CAD, HTN, HLP, who presents for followup consultation of his essential tremor. He is unaccompanied today. Of note, he missed an appointment on 05/16/2014, because of an appointment for his wife at the same time. I last saw him on 11/13/2013, at which time he reported that the tremor was fluctuating. He had experienced intermittent vertigo. I did not suggest any medication changes for fear of causing his pulse to drop with increase in beta blocker and his balance to get worse with a trial of primidone.  Today, he reports feeling quite stable with his tremor. He has not noted any significant exacerbation of tremor but does note it tends to get worse when he is under stress. He has had some stress regarding his wife's health who suffers from significant arthritis in multiple areas. He has intermittent numbness in his right sole of the foot and also in his right toes. This comes and goes and is worse with prolonged standing. He has heel pain on the left side. He wears inserts in both shoes. He has not had much in the way of vertigo or dizziness. He does sometimes feel that he is unsteady when standing for prolonged period of time and it feels like his right leg may want to give out. He has no significant knee arthritis but does have low back pain. He denies any sciatica. He just recently finished painting the exterior of his shed.  I saw him on 09/14/2012, at which time I kept him on low-dose metoprolol as he already had a low heart rate and I did not start him on primidone for fear of exacerbating his dizziness and balance problems and sedation, given his advanced age.  I first met him on 08/24/2012, which time he reported that he fell in December and hit his head. He was already on low-dose beta blocker because of his  heart and I did not suggest that he increase it because his heart rate was already in the 50s. We talked about adding another medication down the Road but I did not suggest that we start him just yet. I did suggest a head CT based on his history of fall. He had a head CT on 09/06/2012 which showed a left frontal chronic subdural hematoma with mild midline shift but because he had moderate frontal atrophy there was not a whole other midline shift. I suggested that we repeat his head CT and we called him with his test results. He had a repeat head CT on 10/05/2012: Chronic left frontal subdural hematoma. Mild mass effect upon underlying frontal lobe. Minimal 2 mm of left to right midline shift. Compared to the prior CT from 09/06/12, there is slight reduction in size of the subdural hematoma.  He and his wife noted that his tremors have become worse since he had a heart attack last year. He has a prior history of coronary artery bypass surgery in the 80s and also had a stent placed in 2001. He has not noticed much in the way of difficulty with his posture. He does notice sometimes that his balance is not very good. Of note he has fallen recently before Christmas last year. He actually tripped over a piece of wire that was outside and landed on the ground which was cold and frozen at  the time. He fell and hit his for head. He has not had a head CT. He reports no residual headaches are one-sided weakness, numbness, slurring of speech or droopy face. Nevertheless at the time he had a severe headache. He's had degenerative spine disease and has had epidural injections to his neck. He's had some numbness and tingling radiating to his left index finger. He has seen a neurosurgeon who suggested surgery but left it up to him to decide. He has had epidural injection had his last injection in February 2012.  He has had some lightheadedness, and recently was told to stop his Prinivil. He has not fallen, but has had some close  calls. He denies vertigo. Never had TIA or stroke symptoms, denying sudden onset of one sided weakness, numbness, tingling, slurring of speech or droopy face, hearing loss, tinnitus, diplopia or visual field cut or monocular loss of vision, and denies recurrent headaches.  His tremor affects both hands but is worse on the left. It is primarily a action tremor and his handwriting is impaired. 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 He was very active in his work and had to stop suddenly because of his heart attack. He felt even if he had not stop working because of his heart he would've stopped because of his trembling. He has not noticed a lower extremity tremor. He does not report any stiffness or drooling. He does not report any memory difficulties or other cognitive impairment. He tries to walk regularly and his wife has not noticed any changes in his gait.  He had a brother who passed away at age 32, from cancer in his lymph nodes and he had PD. He recalls no other FHx of tremors. He fell off a ladder in 2009 and had surgery.   His Past Medical History Is Significant For: Past Medical History  Diagnosis Date  . Arthritis   . Hypertension   . CAD (coronary artery disease)   . Old myocardial infarct   . S/P CABG x 5 08/29/1984    LIMA to LAD,SVG to diagonal,SVG to OM,SVG to PDA and PLA  . Tremor, essential   . Hyperlipidemia     His Past Surgical History Is Significant For: Past Surgical History  Procedure Laterality Date  . Coronary artery bypass graft  1986    x5 LIMA to LAD,SVG to diagonal,SVG to OM,SVG to PDA and PLA  . Coronary stent placement    . Cholecystectomy  10/1990  . Inguinal hernia repair  04/25/2012    right- laparoscopic  . US echocardiography  12/27/2008    concentric LVH,mild to mod MR, TR and pulmonary hypertension. EF 45-50%  . R/p myoview  12/27/2008    no ischemia    His Family History Is Significant For: Family History  Problem Relation Age of Onset   . Cancer Father     bone  . Cancer Brother     lymphnode    His Social History Is Significant For: History   Social History  . Marital Status: Married    Spouse Name: Paul Frazier    Number of Children: 3  . Years of Education: 14   Occupational History  .      retired   Social History Main Topics  . Smoking status: Former Smoker    Start date: 07/19/1970  . Smokeless tobacco: Never Used  . Alcohol Use: No  . Drug Use: No  . Sexual Activity: None   Other Topics Concern  .  None   Social History Narrative   Patient consumes 2 cups of caffeine daily,sometimes 1 cup in evening    His Allergies Are:  Allergies  Allergen Reactions  . Erythromycin Nausea Only  :   His Current Medications Are:  Outpatient Encounter Prescriptions as of 06/04/2014  Medication Sig  . aspirin 81 MG tablet Take 81 mg by mouth daily.  [name] buPROPion (WELLBUTRIN XL) 300 MG 24 hr tablet TAKE ONE TABLET BY MOUTH ONCE DAILY FOR  MOOD  . diazepam (VALIUM) 5 MG tablet TAKE 1/2-1 TAB THREE TIMES A DAY AS NEEDED FOR MUSCLE SPASM  . Digestive Enzymes CAPS Take 1 capsule by mouth as needed.  . fish oil-omega-3 fatty acids 1000 MG capsule Take 1 g by mouth daily. Take 3 daily  . Magnesium Chloride (MAGNESIUM DR PO) Take by mouth daily.  . metoprolol succinate (TOPROL XL) 25 MG 24 hr tablet Take 1 tablet (25 mg total) by mouth daily.  . nitroGLYCERIN (NITROSTAT) 0.4 MG SL tablet Place 0.4 mg under the tongue every 5 (five) minutes as needed for chest pain.  [name] Specialty Vitamins Products (PROSTATE) TABS Take 2 tablets by mouth.  :  Review of Systems:  Out of a complete 14 point review of systems, all are reviewed and negative with the exception of these symptoms as listed below:   Review of Systems  Musculoskeletal: Positive for back pain.       Joint pain, muscle cramps  Neurological:       Snoring, restless leg,   Hematological: Bruises/bleeds easily.    Objective:  Neurologic Exam  Physical  Exam Physical Examination:   Filed Vitals:   06/04/14 1115  BP: 142/71  Pulse: 61  Temp: 97.7 F (36.5 C)    General Examination: The patient is a very pleasant 80 y.o. male in no acute distress. He appears well-developed and well-nourished and well groomed. He is in good spirits today.  HEENT exam: Normocephalic, atraumatic, pupils are equal, round and reactive to light and accommodation. Funduscopic exam is normal. He is status post bilateral cataract repairs. Extraocular tracking is good without nystagmus. Hearing is intact. Neck is supple with full range of motion. He has no lip, or jaw tremor, and no head tremor. Oropharynx shows mild to moderate mouth dryness, with a mildly narrow appearing airway. Tonsils are absent. Tongue protrudes centrally and palate elevates symmetrically.  Chest is clear to auscultation without any wheezing or rhonchi.  Heart sounds are normal without murmurs, rubs or gallops.  Abdomen is soft, nontender with normal bowel sounds appreciated.  He has no pitting edema in the distal lower extremities bilaterally. He has some scars from where his vessels were harvested in his distal legs. He has a hypopigmented scar in his right forearm which is old. Neurologically: Mental status: The patient is awake, alert and oriented in all 3 spheres. His memory, attention, language and knowledge are appropriate. Cranial nerves II through XII are as described under HEENT exam. In addition, shoulder strength is good. Motor exam: Normal bulk, strength and tone is noted. He has a mild intermittent resting tremor in both UEs, he has a mild postural and action tremor in both upper extremities. Fine motor skills are fairly well-preserved with respect to finger taps (difficulty with L due to prior injury/surgery) hand movements and rapid alternating patting. There is no decrement in amplitude. Foot taps and foot agility is normal. Reflexes are 1-2+ throughout. On cerebellar testing he has no  gait or truncal ataxia  and finger to nose testing shows no dysmetria or intention tremor, but he does have mild bilateral action tremor. Romberg testing shows minimal swaying, but he is able to stand with narrow base. His walking shows a slight degree of caution that he has preserved arm swing and walks with regular stride length. His posture is age-appropriate. He has trouble with tandem walk, which is unchanged. He turns well without needing extra steps. Sensory exam is intact to light touch, pinprick, vibration and temperature sense in the upper and lower extremities with no deficit noted especially in the right foot. Toes are downgoing bilaterally.   Assessment and Plan:   In summary, Paul Frazier. is a very pleasant 80 year old male with a history of with a bilateral upper extremity tremor, L worse than R, with a stable exam. He most likely has essential tremor with stable findings. He feels largely unchanged. Of note he has had no recent falls and reports no headaches or new issues with dizziness or vertigo. I did ask him not to climb any ladders and not to use any dangerous machinery or heavy equipment. He is on a beta blocker. We mutually agreed not to add any new medication or change his existing medication. In the past we discussed the possibility of using primidone but I suggested we continue to monitor his tremor and I was reluctant to try him on primidone for fear of inducing balance problems and sedation. Since he has been stable over the past 6+ months I did suggest a 6 month follow-up again for monitoring his symptoms but he has had to attend more appointments with his wife and it is harder for him to keep his own appointments. We therefore mutually agreed that I would see him back as needed and he promised to call if he has any new  Neurological symptoms or problems or significant exacerbation of his tremors. He is advised to keep well hydrated and change positions slowly and not to climb  ladders. He is advised to increase his water intake. He has a prior history of subdural hematoma which we watched and followed with CAT scan and he improved and had no new symptom and no recent headaches. If he has return of his headache we will have a lower threshold of doing another CAT scan. At this juncture, his exam is stable. I answered all his questions and he was in agreement with the plan.

## 2014-07-05 ENCOUNTER — Encounter: Payer: Self-pay | Admitting: Internal Medicine

## 2014-07-10 ENCOUNTER — Encounter: Payer: Self-pay | Admitting: Internal Medicine

## 2014-07-25 ENCOUNTER — Encounter: Payer: Self-pay | Admitting: Internal Medicine

## 2014-08-14 ENCOUNTER — Other Ambulatory Visit: Payer: Self-pay | Admitting: Emergency Medicine

## 2014-08-14 ENCOUNTER — Other Ambulatory Visit: Payer: Self-pay | Admitting: Physician Assistant

## 2014-08-14 ENCOUNTER — Other Ambulatory Visit: Payer: Self-pay | Admitting: Cardiovascular Disease

## 2014-08-14 NOTE — Telephone Encounter (Signed)
Rx(s) sent to pharmacy electronically.  

## 2014-09-02 ENCOUNTER — Encounter: Payer: Self-pay | Admitting: Internal Medicine

## 2014-09-10 ENCOUNTER — Other Ambulatory Visit: Payer: Self-pay | Admitting: Physician Assistant

## 2014-09-11 ENCOUNTER — Other Ambulatory Visit: Payer: Self-pay | Admitting: Internal Medicine

## 2014-09-11 DIAGNOSIS — F32A Depression, unspecified: Secondary | ICD-10-CM

## 2014-09-11 DIAGNOSIS — F329 Major depressive disorder, single episode, unspecified: Secondary | ICD-10-CM

## 2014-09-11 DIAGNOSIS — F325 Major depressive disorder, single episode, in full remission: Secondary | ICD-10-CM | POA: Insufficient documentation

## 2014-09-11 MED ORDER — BUPROPION HCL ER (XL) 300 MG PO TB24: ORAL_TABLET | ORAL | Status: DC

## 2014-09-24 DIAGNOSIS — N200 Calculus of kidney: Secondary | ICD-10-CM | POA: Diagnosis not present

## 2014-10-08 ENCOUNTER — Other Ambulatory Visit: Payer: Self-pay | Admitting: Internal Medicine

## 2014-10-09 ENCOUNTER — Other Ambulatory Visit: Payer: Self-pay | Admitting: *Deleted

## 2014-10-09 MED ORDER — BUPROPION HCL ER (XL) 300 MG PO TB24: ORAL_TABLET | ORAL | Status: DC

## 2014-10-10 ENCOUNTER — Ambulatory Visit: Payer: Self-pay | Admitting: Internal Medicine

## 2014-10-16 ENCOUNTER — Encounter: Payer: Self-pay | Admitting: Internal Medicine

## 2014-10-16 ENCOUNTER — Ambulatory Visit (INDEPENDENT_AMBULATORY_CARE_PROVIDER_SITE_OTHER): Payer: Medicare Other | Admitting: Internal Medicine

## 2014-10-16 ENCOUNTER — Other Ambulatory Visit: Payer: Self-pay | Admitting: *Deleted

## 2014-10-16 VITALS — BP 126/62 | HR 56 | Temp 97.5°F | Resp 16 | Ht 66.5 in | Wt 169.6 lb

## 2014-10-16 DIAGNOSIS — Z23 Encounter for immunization: Secondary | ICD-10-CM

## 2014-10-16 DIAGNOSIS — E559 Vitamin D deficiency, unspecified: Secondary | ICD-10-CM | POA: Diagnosis not present

## 2014-10-16 DIAGNOSIS — R7309 Other abnormal glucose: Secondary | ICD-10-CM | POA: Diagnosis not present

## 2014-10-16 DIAGNOSIS — I10 Essential (primary) hypertension: Secondary | ICD-10-CM

## 2014-10-16 DIAGNOSIS — Z125 Encounter for screening for malignant neoplasm of prostate: Secondary | ICD-10-CM

## 2014-10-16 DIAGNOSIS — R6889 Other general symptoms and signs: Secondary | ICD-10-CM

## 2014-10-16 DIAGNOSIS — E782 Mixed hyperlipidemia: Secondary | ICD-10-CM | POA: Diagnosis not present

## 2014-10-16 DIAGNOSIS — F329 Major depressive disorder, single episode, unspecified: Secondary | ICD-10-CM

## 2014-10-16 DIAGNOSIS — Z1331 Encounter for screening for depression: Secondary | ICD-10-CM

## 2014-10-16 DIAGNOSIS — Z79899 Other long term (current) drug therapy: Secondary | ICD-10-CM

## 2014-10-16 DIAGNOSIS — Z0001 Encounter for general adult medical examination with abnormal findings: Secondary | ICD-10-CM | POA: Diagnosis not present

## 2014-10-16 DIAGNOSIS — Z9181 History of falling: Secondary | ICD-10-CM

## 2014-10-16 DIAGNOSIS — I2510 Atherosclerotic heart disease of native coronary artery without angina pectoris: Secondary | ICD-10-CM

## 2014-10-16 DIAGNOSIS — Z1212 Encounter for screening for malignant neoplasm of rectum: Secondary | ICD-10-CM

## 2014-10-16 DIAGNOSIS — R7303 Prediabetes: Secondary | ICD-10-CM

## 2014-10-16 DIAGNOSIS — F32A Depression, unspecified: Secondary | ICD-10-CM

## 2014-10-16 DIAGNOSIS — Z0000 Encounter for general adult medical examination without abnormal findings: Secondary | ICD-10-CM

## 2014-10-16 LAB — LIPID PANEL
Cholesterol: 196 mg/dL (ref 0–200)
HDL: 44 mg/dL (ref >=40)
LDL Cholesterol: 130 mg/dL — ABNORMAL HIGH (ref 0–99)
Total CHOL/HDL Ratio: 4.5 Ratio
Triglycerides: 111 mg/dL (ref <150)
VLDL: 22 mg/dL (ref 0–40)

## 2014-10-16 LAB — CBC WITH DIFFERENTIAL/PLATELET
Basophils Absolute: 0.1 10*3/uL (ref 0.0–0.1)
Basophils Relative: 1 % (ref 0–1)
Eosinophils Absolute: 0.3 10*3/uL (ref 0.0–0.7)
Eosinophils Relative: 4 % (ref 0–5)
HCT: 47.6 % (ref 39.0–52.0)
Hemoglobin: 16.1 g/dL (ref 13.0–17.0)
Lymphocytes Relative: 35 % (ref 12–46)
Lymphs Abs: 2.8 10*3/uL (ref 0.7–4.0)
MCH: 31.1 pg (ref 26.0–34.0)
MCHC: 33.8 g/dL (ref 30.0–36.0)
MCV: 92.1 fL (ref 78.0–100.0)
MPV: 10.6 fL (ref 8.6–12.4)
Monocytes Absolute: 0.6 10*3/uL (ref 0.1–1.0)
Monocytes Relative: 8 % (ref 3–12)
Neutro Abs: 4.2 10*3/uL (ref 1.7–7.7)
Neutrophils Relative %: 52 % (ref 43–77)
Platelets: 235 10*3/uL (ref 150–400)
RBC: 5.17 MIL/uL (ref 4.22–5.81)
RDW: 14.2 % (ref 11.5–15.5)
WBC: 8 10*3/uL (ref 4.0–10.5)

## 2014-10-16 LAB — HEPATIC FUNCTION PANEL
ALT: 28 U/L (ref 0–53)
AST: 23 U/L (ref 0–37)
Albumin: 4.2 g/dL (ref 3.5–5.2)
Alkaline Phosphatase: 65 U/L (ref 39–117)
Bilirubin, Direct: 0.1 mg/dL (ref 0.0–0.3)
Indirect Bilirubin: 0.5 mg/dL (ref 0.2–1.2)
Total Bilirubin: 0.6 mg/dL (ref 0.2–1.2)
Total Protein: 7.1 g/dL (ref 6.0–8.3)

## 2014-10-16 LAB — MAGNESIUM: Magnesium: 1.9 mg/dL (ref 1.5–2.5)

## 2014-10-16 LAB — BASIC METABOLIC PANEL WITH GFR
BUN: 17 mg/dL (ref 6–23)
CO2: 30 mEq/L (ref 19–32)
Calcium: 9.6 mg/dL (ref 8.4–10.5)
Chloride: 102 mEq/L (ref 96–112)
Creat: 0.96 mg/dL (ref 0.50–1.35)
GFR, Est African American: 86 mL/min
GFR, Est Non African American: 74 mL/min
Glucose, Bld: 108 mg/dL — ABNORMAL HIGH (ref 70–99)
Potassium: 4.2 mEq/L (ref 3.5–5.3)
Sodium: 140 mEq/L (ref 135–145)

## 2014-10-16 LAB — HEMOGLOBIN A1C
Hgb A1c MFr Bld: 6.1 % — ABNORMAL HIGH (ref <5.7)
Mean Plasma Glucose: 128 mg/dL — ABNORMAL HIGH (ref <117)

## 2014-10-16 LAB — TSH: TSH: 0.759 u[IU]/mL (ref 0.350–4.500)

## 2014-10-16 MED ORDER — BUPROPION HCL ER (XL) 300 MG PO TB24: ORAL_TABLET | ORAL | Status: DC

## 2014-10-16 NOTE — Patient Instructions (Signed)

## 2014-10-16 NOTE — Progress Notes (Signed)
Patient ID: Paul Frazier, male   DOB: 1933/02/19, 81 y.o.   MRN: [id number]  [facility] VISIT AND CPE  Assessment:   1. Essential hypertension  - Microalbumin / creatinine urine ratio - EKG 12-Lead - [place], RETROPERITNL ABD,  LTD - TSH  2. Hyperlipidemia  - Lipid panel  3. Prediabetes  - Hemoglobin A1c - Insulin, random  4. Vitamin D deficiency  - Vit D  25 hydroxy (rtn osteoporosis monitoring)  5. Coronary artery disease involving native coronary artery of native heart without angina pectoris   6. Depression, controlled   7. Screening for rectal cancer  - POC Hemoccult Bld/Stl (3-Cd Home Screen); Future  8. Prostate cancer screening  - PSA  9. At low risk for fall   10. Depression screen  - Screen Negative  11. Routine general medical examination at a health care facility   12. Need for prophylactic vaccination against Streptococcus pneumoniae (pneumococcus)  - Pneumococcal polysaccharide vaccine 23-valent greater than or equal to 2yo subcutaneous/IM  13. Medication management  - Urine Microscopic - CBC with Differential/Platelet - BASIC METABOLIC PANEL WITH GFR - Hepatic function panel - Magnesium  Plan:   During the course of the visit the patient was educated and counseled about appropriate screening and preventive services including:    Pneumococcal vaccine   Influenza vaccine  Td vaccine  Screening electrocardiogram  Bone densitometry screening  Colorectal cancer screening  Diabetes screening  Glaucoma screening  Nutrition counseling   Advanced directives: requested  Screening recommendations, referrals: Vaccinations: Immunization History  Administered Date(s) Administered  . Pneumococcal Polysaccharide-23 10/16/2014  . Td 07/20/2011    DT vaccine 2013 Influenza vaccine refuses Pneumococcal vaccine 10/16/2014 Prevnar vaccine undecided Shingles vaccine declined Hep B vaccine not indicated  Nutrition  assessed and recommended  [place] Recommended yearly ophthalmology/optometry visit for glaucoma screening and checkup Recommended yearly dental visit for hygiene and checkup Advanced directives - yes  Conditions/risks identified: BMI: Discussed weight loss, diet, and increase physical activity.  Increase physical activity: AHA recommends 150 minutes of physical activity a week.  Medications reviewed PreDiabetes is near goal, ACE/ARB therapy: not indicated Urinary Incontinence is not an issue: discussed non pharmacology and pharmacology options.  Fall risk: low- discussed PT, home fall assessment, medications.   Subjective:    Paul Frazier is a 81 y.o. male who presents for Medicare Annual Wellness Visit and complete physical.  Date of last medicare wellness visit is unknown.  He has had elevated blood pressure since the mid 1980's. [name] His blood pressure has been controlled at home, today his BP is BP: 126/62 mmHg. He does have CKD2 with GFR 13ml/min presumed due to hypertensive nephrosclerosis. In 1986 he underwent CABG and had a Stent in 2001. He relates a Ht Cath in 2012 showed no acute problems.  He does not workout. He denies chest pain, shortness of breath, dizziness.  He has refused to take cholesterol medication. His cholesterol is not at goal. The cholesterol last visit was:  Lab Results  Component Value Date   CHOL 196 10/16/2014   HDL 44 10/16/2014   LDLCALC 130* 10/16/2014   TRIG 111 10/16/2014   CHOLHDL 4.5 10/16/2014   He has had prediabetes for 2 years since June 2013 with an A1c 5.8%. He has not been working on diet and exercise for prediabetes, and denies foot ulcerations, hyperglycemia, hypoglycemia , nausea, paresthesia of the feet, polydipsia, polyuria and visual disturbances. Last A1C  in the office was:  Lab Results  Component Value Date   HGBA1C 6.1* 04/08/2014   Patient is on Vitamin D supplement.   Lab  Results  Component Value Date   VD25OH 61 01/01/2014    Patient does have elevated PSA in the 7-9 range and was recommended bx by Dr Dutch Gray which patient refused despite informed potential for possible cancer which could lead to death.   Names of Other Physician/Practitioners you currently use: 1. [facility] here for primary care 2. "[facility] Clinic " at [facility], last visit 2015 3. No recent dental evaluations in > 3 years  Patient Care Team: Unk Pinto, MD as PCP - General (Internal Medicine) [name] Age, MD as Attending Physician (Neurology) Sanda Klein, MD as Consulting Physician (Cardiology) Raynelle Bring, MD as Consulting Physician (Urology)  Medication Review: Medication Sig  . aspirin 81 MG tablet Take 81 mg by mouth daily.  [name] buPROPion (WELLBUTRIN XL) 300 MG 24 hr tablet Take 1 tablet daily for mood  . diazepam (VALIUM) 5 MG tablet TAKE 1/2-1 TAB THREE TIMES A DAY AS NEEDED FOR MUSCLE SPASM  . Digestive Enzymes CAPS Take 1 capsule by mouth as needed.  . fish oil-omega-3 fatty acids 1000 MG capsule Take 1 g by mouth daily. Take 3 daily  . Magnesium Chloride (MAGNESIUM DR PO) Take by mouth daily.  . metoprolol succinate (TOPROL-XL) 25 MG 24 hr tablet TAKE ONE TABLET BY MOUTH ONCE DAILY  . nitroGLYCERIN (NITROSTAT) 0.4 MG SL tablet Place 0.4 mg under the tongue every 5 (five) minutes as needed for chest pain.  [name] Specialty Vitamins Products (PROSTATE) TABS Take 2 tablets by mouth.   Current Problems (verified) Patient Active Problem List   Diagnosis Date Noted  . Depression, controlled 09/11/2014  . Essential hypertension 01/01/2014  . Vitamin D deficiency 01/01/2014  . Medication management 01/01/2014  . Prediabetes 01/01/2014  . Nonsustained ventricular tachycardia 05/23/2013  . CAD (coronary artery disease) 01/08/2013  . Cardiomyopathy, ischemic 01/08/2013  . Hyperlipidemia 01/08/2013  . LBBB (left bundle  branch block) 01/08/2013  . Fatigue 01/08/2013   Screening Tests Health Maintenance  Topic Date Due  . TETANUS/TDAP  07/23/1953  . COLONOSCOPY  07/23/1984  . ZOSTAVAX  07/23/1994  . PNA vac Low Risk Adult (1 of 2 - PCV13) 07/24/1999  . INFLUENZA VACCINE  02/16/2014    Immunization History  Administered Date(s) Administered  . Pneumococcal Polysaccharide-23 10/16/2014  . Td 07/20/2011    Preventative care: Last colonoscopy: 5-10 yr ago - as above, has refused & now 81 yo  History reviewed: allergies, current medications, past family history, past medical history, past social history, past surgical history and problem list  Risk Factors: Tobacco History  Substance Use Topics  . Smoking status: Former Smoker    Start date: 07/19/1970  . Smokeless tobacco: Never Used  . Alcohol Use: No   He does not smoke.  Patient is a former smoker. Are there smokers in your home (other than you)?  No  Alcohol Current alcohol use: none  Caffeine Current caffeine use: coffee 2 cups /day  Exercise Current exercise: none  Nutrition/Diet Current diet: in general, an "unhealthy" diet  Cardiac risk factors: advanced age (older than 43 for men, 16 for women), dyslipidemia, hypertension, male gender, sedentary lifestyle and smoking/ tobacco exposure.  Depression Screen (Note: if answer to either of the following is "Yes", a more complete depression screening is indicated)   Q1: Over the  past two weeks, have you felt down, depressed or hopeless? No  Q2: Over the past two weeks, have you felt little interest or pleasure in doing things? No  Have you lost interest or pleasure in daily life? No  Do you often feel hopeless? No  Do you cry easily over simple problems? No  Activities of Daily Living In your present state of health, do you have any difficulty performing the following activities?:  Driving? No Managing money?  No Feeding yourself? No Getting from bed to chair? No Climbing  a flight of stairs? No Preparing food and eating?: No Bathing or showering? No Getting dressed: No Getting to the toilet? No Using the toilet:No Moving around from place to place: No In the past year have you fallen or had a near fall?:No   Are you sexually active?  No  Do you have more than one partner?  No  Vision Difficulties: No  Hearing Difficulties: No Do you often ask people to speak up or repeat themselves? No Do you experience ringing or noises in your ears? No Do you have difficulty understanding soft or whispered voices? No  Cognition  Do you feel that you have a problem with memory?No  Do you often misplace items? No  Do you feel safe at home?  Yes  Advanced directives Does patient have a [place]? Yes Does patient have a Living Will? Yes  Past Medical History  Diagnosis Date  . Arthritis   . Hypertension   . CAD (coronary artery disease)   . Old myocardial infarct   . S/P CABG x 5 08/29/1984    LIMA to LAD,SVG to diagonal,SVG to OM,SVG to PDA and PLA  . Tremor, essential   . Hyperlipidemia     Past Surgical History  Procedure Laterality Date  . Coronary artery bypass graft  1986    x5 LIMA to LAD,SVG to diagonal,SVG to OM,SVG to PDA and PLA  . Coronary stent placement    . Cholecystectomy  10/1990  . Inguinal hernia repair  04/25/2012    right- laparoscopic  . US echocardiography  12/27/2008    concentric LVH,mild to mod MR, TR and pulmonary hypertension. EF 45-50%  . R/p myoview  12/27/2008    no ischemia    ROS: Constitutional: Denies fever, chills, weight loss/gain, headaches, insomnia, fatigue, night sweats or change in appetite. Eyes: Denies redness, blurred vision, diplopia, discharge, itchy or watery eyes.  ENT: Denies discharge, congestion, post nasal drip, epistaxis, sore throat, earache, hearing loss, dental pain, Tinnitus, Vertigo, Sinus pain or snoring.  Cardio: Denies chest pain, palpitations, irregular heartbeat,  syncope, dyspnea, diaphoresis, orthopnea, PND, claudication or edema Respiratory: denies cough, dyspnea, DOE, pleurisy, hoarseness, laryngitis or wheezing.  Gastrointestinal: Denies dysphagia, heartburn, reflux, water brash, pain, cramps, nausea, vomiting, bloating, diarrhea, constipation, hematemesis, melena, hematochezia, jaundice or hemorrhoids Genitourinary: Denies dysuria, frequency, urgency, nocturia, hesitancy, discharge, hematuria or flank pain Musculoskeletal: Denies arthralgia, myalgia, stiffness, Jt. Swelling, pain, limp or strain/sprain. Denies Falls. Skin: Denies puritis, rash, hives, warts, acne, eczema or change in skin lesion Neuro: No weakness, tremor, incoordination, spasms, paresthesia or pain Psychiatric: Denies confusion, memory loss or sensory loss. Denies Depression. Endocrine: Denies change in weight, skin, hair change, nocturia, and paresthesia, diabetic polys, visual blurring or hyper / hypo glycemic episodes.  Heme/Lymph: No excessive bleeding, bruising or enlarged lymph nodes.  Objective:     BP 126/62 mmHg  Pulse 56  Temp(Src) 97.5 F (36.4 C)  Resp 16  Ht 5' 6.5" (1.689 m)  Wt 169 lb 9.6 oz (76.93 kg)  BMI 26.97 kg/m2  General Appearance:  Alert  WD/WN, male , in no apparent distress. Eyes: PERRLA, EOMs, conjunctiva no swelling or erythema, normal fundi and vessels. Sinuses: No frontal/maxillary tenderness ENT/Mouth: EACs patent / TMs  nl. Nares clear without erythema, swelling, mucoid exudates. Oral hygiene is good. No erythema, swelling, or exudate. Tongue normal, non-obstructing. Tonsils not swollen or erythematous. Hearing normal.  Neck: Supple, thyroid normal. No bruits, nodes or JVD. Respiratory: Respiratory effort normal.  BS equal and clear bilateral without rales, rhonci, wheezing or stridor. Cardio: Heart sounds are normal with regular rate and rhythm and no murmurs, rubs or gallops. Peripheral pulses are normal and equal bilaterally without edema.  No aortic or femoral bruits. Chest: symmetric with normal excursions and percussion.  Abdomen: Flat, soft, with nl bowel sounds. Nontender, no guarding, rebound, hernias, masses, or organomegaly.  Lymphatics: Non tender without lymphadenopathy.  Genitourinary: No hernias.Testes nl. DRE - prostate2-3 (+) enlarged & firm - smooth & firm w/o nodules. Musculoskeletal: Full ROM all peripheral extremities, joint stability, 5/5 strength, and normal gait. Skin: Warm and dry without rashes, lesions, cyanosis, clubbing or  ecchymosis.  Neuro: Cranial nerves intact, reflexes equal bilaterally. Normal muscle tone, no cerebellar symptoms. Sensation intact.  Pysch: Awake and oriented X 3 with normal affect, insight and judgment appropriate.   Cognitive Testing  Alert? Yes  Normal Appearance? Yes  Oriented to person? Yes  Place? Yes   Time? Yes  Recall of three objects?  Yes  Can perform simple calculations? Yes  Displays appropriate judgment? Yes  Can read the correct time from a watch/clock? Yes  Medicare Attestation I have personally reviewed: The patient's medical and social history Their use of alcohol, tobacco or illicit drugs Their current medications and supplements The patient's functional ability including ADLs,fall risks, home safety risks, cognitive, and hearing and visual impairment Diet and physical activities Evidence for depression or mood disorders  The patient's weight, height, BMI, and visual acuity have been recorded in the chart.  I have made referrals, counseling, and provided education to the patient based on review of the above and I have provided the patient with a written personalized care plan for preventive services.  Over 40 minutes of exam, counseling, chart review was performed.   MCKEOWN,WILLIAM DAVID, MD   10/16/2014

## 2014-10-17 LAB — INSULIN, RANDOM: Insulin: 6.6 u[IU]/mL (ref 2.0–19.6)

## 2014-10-17 LAB — MICROALBUMIN / CREATININE URINE RATIO
Creatinine, Urine: 199.7 mg/dL
Microalb Creat Ratio: 4 mg/g (ref 0.0–30.0)
Microalb, Ur: 0.8 mg/dL (ref <2.0)

## 2014-10-17 LAB — URINALYSIS, MICROSCOPIC ONLY
Bacteria, UA: NONE SEEN
Casts: NONE SEEN
Crystals: NONE SEEN
Squamous Epithelial / LPF: NONE SEEN

## 2014-10-17 LAB — PSA: PSA: 24.25 ng/mL — ABNORMAL HIGH (ref <=4.00)

## 2014-10-17 LAB — VITAMIN D 25 HYDROXY (VIT D DEFICIENCY, FRACTURES): Vit D, 25-Hydroxy: 49 ng/mL (ref 30–100)

## 2014-10-21 ENCOUNTER — Other Ambulatory Visit: Payer: Self-pay | Admitting: *Deleted

## 2014-10-21 MED ORDER — ATORVASTATIN CALCIUM 80 MG PO TABS
ORAL_TABLET | ORAL | Status: DC
Start: 2014-10-21 — End: 2015-05-01

## 2014-11-18 ENCOUNTER — Other Ambulatory Visit: Payer: Self-pay | Admitting: *Deleted

## 2014-11-18 DIAGNOSIS — Z1212 Encounter for screening for malignant neoplasm of rectum: Secondary | ICD-10-CM

## 2014-11-18 DIAGNOSIS — Z1211 Encounter for screening for malignant neoplasm of colon: Secondary | ICD-10-CM | POA: Diagnosis not present

## 2014-11-18 LAB — POC HEMOCCULT BLD/STL (HOME/3-CARD/SCREEN)
Card #2 Fecal Occult Blod, POC: NEGATIVE
Card #3 Fecal Occult Blood, POC: NEGATIVE
Fecal Occult Blood, POC: NEGATIVE

## 2014-11-23 ENCOUNTER — Other Ambulatory Visit: Payer: Self-pay | Admitting: Internal Medicine

## 2014-11-25 ENCOUNTER — Other Ambulatory Visit: Payer: Self-pay | Admitting: *Deleted

## 2014-11-25 LAB — COLOGUARD: Cologuard: NEGATIVE

## 2014-11-25 MED ORDER — BUPROPION HCL ER (XL) 300 MG PO TB24: ORAL_TABLET | ORAL | Status: DC

## 2014-11-25 MED ORDER — DIAZEPAM 5 MG PO TABS: ORAL_TABLET | ORAL | Status: DC

## 2014-11-25 MED ORDER — METOPROLOL SUCCINATE ER 25 MG PO TB24: 25.0000 mg | ORAL_TABLET | Freq: Every day | ORAL | Status: DC

## 2015-01-20 ENCOUNTER — Encounter: Payer: Self-pay | Admitting: *Deleted

## 2015-01-23 ENCOUNTER — Ambulatory Visit (INDEPENDENT_AMBULATORY_CARE_PROVIDER_SITE_OTHER): Payer: Medicare Other | Admitting: Internal Medicine

## 2015-01-23 ENCOUNTER — Encounter: Payer: Self-pay | Admitting: Internal Medicine

## 2015-01-23 VITALS — BP 132/70 | HR 60 | Temp 98.2°F | Resp 16 | Ht 66.0 in | Wt 166.0 lb

## 2015-01-23 DIAGNOSIS — I10 Essential (primary) hypertension: Secondary | ICD-10-CM | POA: Diagnosis not present

## 2015-01-23 DIAGNOSIS — E663 Overweight: Secondary | ICD-10-CM | POA: Diagnosis not present

## 2015-01-23 DIAGNOSIS — Z79899 Other long term (current) drug therapy: Secondary | ICD-10-CM | POA: Diagnosis not present

## 2015-01-23 DIAGNOSIS — R7309 Other abnormal glucose: Secondary | ICD-10-CM

## 2015-01-23 DIAGNOSIS — E559 Vitamin D deficiency, unspecified: Secondary | ICD-10-CM

## 2015-01-23 DIAGNOSIS — R7303 Prediabetes: Secondary | ICD-10-CM

## 2015-01-23 DIAGNOSIS — F32A Depression, unspecified: Secondary | ICD-10-CM

## 2015-01-23 DIAGNOSIS — F329 Major depressive disorder, single episode, unspecified: Secondary | ICD-10-CM

## 2015-01-23 DIAGNOSIS — E782 Mixed hyperlipidemia: Secondary | ICD-10-CM

## 2015-01-23 DIAGNOSIS — K5900 Constipation, unspecified: Secondary | ICD-10-CM

## 2015-01-23 DIAGNOSIS — I2510 Atherosclerotic heart disease of native coronary artery without angina pectoris: Secondary | ICD-10-CM

## 2015-01-23 LAB — CBC WITH DIFFERENTIAL/PLATELET
Basophils Absolute: 0 10*3/uL (ref 0.0–0.1)
Basophils Relative: 0 % (ref 0–1)
Eosinophils Absolute: 0.2 10*3/uL (ref 0.0–0.7)
Eosinophils Relative: 3 % (ref 0–5)
HCT: 49.8 % (ref 39.0–52.0)
Hemoglobin: 17 g/dL (ref 13.0–17.0)
Lymphocytes Relative: 34 % (ref 12–46)
Lymphs Abs: 2.8 10*3/uL (ref 0.7–4.0)
MCH: 31.8 pg (ref 26.0–34.0)
MCHC: 34.1 g/dL (ref 30.0–36.0)
MCV: 93.3 fL (ref 78.0–100.0)
MPV: 11.4 fL (ref 8.6–12.4)
Monocytes Absolute: 0.7 10*3/uL (ref 0.1–1.0)
Monocytes Relative: 9 % (ref 3–12)
Neutro Abs: 4.5 10*3/uL (ref 1.7–7.7)
Neutrophils Relative %: 54 % (ref 43–77)
Platelets: 204 10*3/uL (ref 150–400)
RBC: 5.34 MIL/uL (ref 4.22–5.81)
RDW: 14.1 % (ref 11.5–15.5)
WBC: 8.3 10*3/uL (ref 4.0–10.5)

## 2015-01-23 LAB — LIPID PANEL
Cholesterol: 185 mg/dL (ref 0–200)
HDL: 49 mg/dL (ref >=40)
LDL Cholesterol: 113 mg/dL — ABNORMAL HIGH (ref 0–99)
Total CHOL/HDL Ratio: 3.8 Ratio
Triglycerides: 113 mg/dL (ref <150)
VLDL: 23 mg/dL (ref 0–40)

## 2015-01-23 LAB — HEPATIC FUNCTION PANEL
ALT: 26 U/L (ref 0–53)
AST: 23 U/L (ref 0–37)
Albumin: 4.4 g/dL (ref 3.5–5.2)
Alkaline Phosphatase: 61 U/L (ref 39–117)
Bilirubin, Direct: 0.1 mg/dL (ref 0.0–0.3)
Indirect Bilirubin: 0.5 mg/dL (ref 0.2–1.2)
Total Bilirubin: 0.6 mg/dL (ref 0.2–1.2)
Total Protein: 7.1 g/dL (ref 6.0–8.3)

## 2015-01-23 LAB — BASIC METABOLIC PANEL WITH GFR
BUN: 15 mg/dL (ref 6–23)
CO2: 30 mEq/L (ref 19–32)
Calcium: 9.6 mg/dL (ref 8.4–10.5)
Chloride: 102 mEq/L (ref 96–112)
Creat: 0.96 mg/dL (ref 0.50–1.35)
GFR, Est African American: 86 mL/min
GFR, Est Non African American: 74 mL/min
Glucose, Bld: 98 mg/dL (ref 70–99)
Potassium: 4.5 mEq/L (ref 3.5–5.3)
Sodium: 141 mEq/L (ref 135–145)

## 2015-01-23 LAB — HEMOGLOBIN A1C
Hgb A1c MFr Bld: 5.9 % — ABNORMAL HIGH (ref <5.7)
Mean Plasma Glucose: 123 mg/dL — ABNORMAL HIGH (ref <117)

## 2015-01-23 LAB — TSH: TSH: 0.496 u[IU]/mL (ref 0.350–4.500)

## 2015-01-23 LAB — MAGNESIUM: Magnesium: 2.1 mg/dL (ref 1.5–2.5)

## 2015-01-23 MED ORDER — BUPROPION HCL ER (XL) 300 MG PO TB24: ORAL_TABLET | ORAL | Status: DC

## 2015-01-23 NOTE — Progress Notes (Signed)
Patient ID: Paul Frazier, male   DOB: 09-Aug-1932, 81 y.o.   MRN: [id number]  Assessment and Plan:  Hypertension:  -Continue medication,  -occasional chest pain with exertion, patient to go to ER or to his cardiologist if this increases or is consistent.  -monitor blood pressure at home.  -Continue DASH diet.   -Reminder to go to the ER if any CP, SOB, nausea, dizziness, severe HA, changes vision/speech, left arm numbness and tingling, and jaw pain.  Cholesterol: -Continue diet and exercise.  -Check cholesterol.   Pre-diabetes: -Continue diet and exercise.  -Check A1C  Vitamin D Def: -check level -continue medications.   Constipation -take magnesium supplement -increase water consumption -increase fiber intake -miralax prn  Continue diet and meds as discussed. Further disposition pending results of labs.  HPI 81 y.o. male  presents for 3 month follow up with hypertension, hyperlipidemia, prediabetes and vitamin D.   His blood pressure has been controlled at home, today their BP is BP: 132/70 mmHg.   He does not workout.  He reports that he and his wife have signed up for the [facility].  He has not used it yet.  He denies chest pain, shortness of breath, dizziness.   He is on cholesterol medication and denies myalgias. His cholesterol is not at goal. The cholesterol last visit was:   Lab Results  Component Value Date   CHOL 196 10/16/2014   HDL 44 10/16/2014   LDLCALC 130* 10/16/2014   TRIG 111 10/16/2014   CHOLHDL 4.5 10/16/2014     He has not been working on diet and exercise for prediabetes, and denies foot ulcerations, hyperglycemia, hypoglycemia , increased appetite, nausea, paresthesia of the feet, polydipsia, polyuria, visual disturbances, vomiting and weight loss. Last A1C in the office was:  Lab Results  Component Value Date   HGBA1C 6.1* 10/16/2014    Patient is on Vitamin D supplement.  Lab Results  Component Value Date   VD25OH 28 10/16/2014      He reports  that he did have some stomach pains recently that nearly brought him to his knees.  He reports that he had lower abdominal pain that lasted for 1 hour and has had no further pain.  He did have a fair amount of constipation with this pain.  He did have a bowel movement after laxative use.    He does report that occasionally he will have some chest pain with exertion intermittiently with cold weather.  He reports his last bout of chest pain was 3 months ago.  He does feel that some of this is related to stress.    Current Medications:  Current Outpatient Prescriptions on File Prior to Visit  Medication Sig Dispense Refill  . aspirin 81 MG tablet Take 81 mg by mouth daily.    [name] atorvastatin (LIPITOR) 80 MG tablet Take 1/2 to 1 tablet daily for cholesterol. 80 tablet 30  . buPROPion (WELLBUTRIN XL) 300 MG 24 hr tablet TAKE ONE TABLET BY MOUTH ONCE DAILY FOR  MOOD 90 tablet 0  . diazepam (VALIUM) 5 MG tablet TAKE 1/2-1 TAB THREE TIMES A DAY AS NEEDED FOR MUSCLE SPASM 90 tablet 2  . Digestive Enzymes CAPS Take 1 capsule by mouth as needed.    . fish oil-omega-3 fatty acids 1000 MG capsule Take 1 g by mouth daily. Take 3 daily    . Magnesium Chloride (MAGNESIUM DR PO) Take by mouth daily.    . metoprolol succinate (TOPROL-XL) 25 MG 24 hr tablet  Take 1 tablet (25 mg total) by mouth daily. 90 tablet 0  . nitroGLYCERIN (NITROSTAT) 0.4 MG SL tablet Place 0.4 mg under the tongue every 5 (five) minutes as needed for chest pain.    [name] Specialty Vitamins Products (PROSTATE) TABS Take 2 tablets by mouth.     No current facility-administered medications on file prior to visit.    Medical History:  Past Medical History  Diagnosis Date  . Arthritis   . Hypertension   . CAD (coronary artery disease)   . Old myocardial infarct   . S/P CABG x 5 08/29/1984    LIMA to LAD,SVG to diagonal,SVG to OM,SVG to PDA and PLA  . Tremor, essential   . Hyperlipidemia     Allergies:  Allergies  Allergen Reactions  .  Erythromycin Nausea Only     Review of Systems:  Review of Systems  Constitutional: Negative for fever, chills and malaise/fatigue.  HENT: Negative for congestion, ear discharge, ear pain and sore throat.   Eyes: Negative.   Respiratory: Negative for cough, shortness of breath and wheezing.   Cardiovascular: Positive for chest pain. Negative for palpitations and leg swelling.  Gastrointestinal: Positive for abdominal pain and constipation. Negative for heartburn, nausea, vomiting, diarrhea, blood in stool and melena.  Genitourinary: Negative.   Skin: Negative.   Neurological: Negative for dizziness, sensory change, loss of consciousness and headaches.  Psychiatric/Behavioral: Positive for depression. The patient is not nervous/anxious and does not have insomnia.     Family history- Review and unchanged  Social history- Review and unchanged  Physical Exam: BP 132/70 mmHg  Pulse 60  Temp(Src) 98.2 F (36.8 C) (Temporal)  Resp 16  Ht 5\' 6"  (1.676 m)  Wt 166 lb (75.297 kg)  BMI 26.81 kg/m2 Wt Readings from Last 3 Encounters:  01/23/15 166 lb (75.297 kg)  10/16/14 169 lb 9.6 oz (76.93 kg)  06/04/14 169 lb (76.658 kg)    General Appearance: Well nourished well developed, in no apparent distress. Eyes: PERRLA, EOMs, conjunctiva no swelling or erythema ENT/Mouth: Ear canals normal without obstruction, swelling, erythma, discharge.  TMs normal bilaterally.  Oropharynx moist, clear, without exudate, or postoropharyngeal swelling. Neck: Supple, thyroid normal,no cervical adenopathy  Respiratory: Respiratory effort normal, Breath sounds clear A&P without rhonchi, wheeze, or rale.  No retractions, no accessory usage. Cardio: RRR with no MRGs. Brisk peripheral pulses without edema.  Abdomen: Soft, + BS,  Non tender, no guarding, rebound, hernias, masses. Musculoskeletal: Full ROM, 5/5 strength, Normal gait Skin: Warm, dry without rashes, lesions, ecchymosis.  Neuro: Awake and oriented  X 3, Cranial nerves intact. Normal muscle tone, no cerebellar symptoms. Psych: depressed affect, Insight and Judgment appropriate.    Starlyn Skeans, PA-C 11:14 AM [facility]

## 2015-01-23 NOTE — Patient Instructions (Signed)
Finasteride (Proscar) tablets   What is this medicine?   FINASTERIDE (fi NAS teer ide) is used to treat benign prostatic hyperplasia (BPH) in men. This is a condition that causes you to have an enlarged prostate. This medicine helps to control your symptoms, decrease urinary retention, and reduces your risk of needing surgery. When used in combination with certain other medicines, this drug can slow down the progression of your disease.   This medicine may be used for other purposes; ask your health care provider or pharmacist if you have questions.   COMMON BRAND NAME(S): Proscar   What should I tell my health care provider before I take this medicine?   They need to know if you have any of these conditions:   -liver disease   -an unusual or allergic reaction to finasteride, other medicines, foods, dyes, or preservatives   -pregnant or trying to get pregnant   -breast-feeding   How should I use this medicine?   Take this medicine by mouth with a glass of water. Follow the directions on the prescription label. You can take this medicine with or without food. Take your doses at regular intervals. Do not take your medicine more often than directed. Do not stop taking except on the advice of your doctor or health care professional.   Talk to your pediatrician regarding the use of this medicine in children. Special care may be needed.   Overdosage: If you think you have taken too much of this medicine contact a poison control center or emergency room at once.   NOTE: This medicine is only for you. Do not share this medicine with others.   What if I miss a dose?   If you miss a dose, take it as soon as you can. If it is almost time for your next dose, take only that dose. Do not take double or extra doses.   What may interact with this medicine?   -male hormones like testosterone   This list may not describe all possible interactions. Give your health care provider a list of all the medicines, herbs, non-prescription  drugs, or dietary supplements you use. Also tell them if you smoke, drink alcohol, or use illegal drugs. Some items may interact with your medicine.   What should I watch for while using this medicine?   Do not donate blood while you are taking this medicine. This will prevent giving this medicine to a pregnant male through a blood transfusion. Ask your doctor or health care professional when it is safe to donate blood after you stop taking this medicine.   Women who are pregnant or may get pregnant must not handle broken or crushed finasteride tablets. The active ingredient could harm the unborn baby. If a pregnant woman comes into contact with broken or crushed tablets she should check with her doctor or health care professional. Exposure to whole tablets is not expected to cause harm as long as they are not swallowed.   Contact your doctor or health care professional if your symptoms do not start to get better. You may need to take this medicine for 6 to 12 months to get the best results.   This medicine can interfere with PSA laboratory tests for prostate cancer. If you are scheduled to have a lab test for prostate cancer, tell your doctor or health care professional that you are taking this medicine.   This medicine may increase your risk of getting some cancers, like breast cancer. Talk with   your doctor.   What side effects may I notice from receiving this medicine?   Side effects that you should report to your doctor or health care professional as soon as possible:   -any signs of an allergic reaction like rash, itching, hives or swelling of the lips or face   -changes in breast like lumps, pain or fluids leaking from the nipple   -pain in the testicles   Side effects that usually do not require medical attention (report to your doctor or health care professional if they continue or are bothersome):   -sexual difficulties like decreased sexual desire or ability to get an erection   -small amount of semen  released during sex   This list may not describe all possible side effects. Call your doctor for medical advice about side effects. You may report side effects to FDA at 1-800-FDA-1088.   Where should I keep my medicine?   Keep out of the reach of children.   Store at room temperature below 30 degrees C (86 degrees F). Protect from light. Keep container tightly closed. Throw away any unused medicine after the expiration date.   NOTE: This sheet is a summary. It may not cover all possible information. If you have questions about this medicine, talk to your doctor, pharmacist, or health care provider.   © 2015, [employer]. (2009-04-30 07:59:33)

## 2015-01-24 LAB — VITAMIN D 25 HYDROXY (VIT D DEFICIENCY, FRACTURES): Vit D, 25-Hydroxy: 57 ng/mL (ref 30–100)

## 2015-01-24 LAB — INSULIN, RANDOM: Insulin: 5.2 u[IU]/mL (ref 2.0–19.6)

## 2015-03-03 ENCOUNTER — Encounter: Payer: Self-pay | Admitting: Cardiovascular Disease

## 2015-03-03 ENCOUNTER — Ambulatory Visit (INDEPENDENT_AMBULATORY_CARE_PROVIDER_SITE_OTHER): Payer: Medicare Other | Admitting: Cardiovascular Disease

## 2015-03-03 VITALS — BP 140/68 | HR 56 | Ht 66.0 in | Wt 172.0 lb

## 2015-03-03 DIAGNOSIS — I2510 Atherosclerotic heart disease of native coronary artery without angina pectoris: Secondary | ICD-10-CM

## 2015-03-03 MED ORDER — AMLODIPINE BESYLATE 2.5 MG PO TABS: 2.5000 mg | ORAL_TABLET | Freq: Every day | ORAL | Status: DC

## 2015-03-03 NOTE — Progress Notes (Signed)
Patient ID: Paul Frazier, male   DOB: 03/25/1933, 81 y.o.   MRN: [id number]     Cardiology Office Note   Date:  03/05/2015   ID:  Paul Frazier, DOB [birth year], MRN [id number]  PCP:  Alesia Richards, MD  Cardiologist:   Sanda Klein, MD   Chief Complaint  Patient presents with  . Paul Frazier    Patient has chest pains at times.      History of Present Illness: Paul Frazier is a 81 y.o. male who presents for CAD status post CABG and mild ischemic cardiomyopathy without congestive heart failure. He continues to have CCS class II exertional angina, more prominent in cold weather, unchanged from previous appointment. He does not have angina at rest and denies problems with dyspnea. He also denies syncope or palpitations.  He has a complicated history of coronary disease and underwent bypass surgery in 1986. He has total occlusion of his LAD artery and his dominant left circumflex coronary artery is graft dependent. He presented with a moderate size non-ST segment elevation myocardial infarction in the setting of paroxysmal supraventricular tachycardia in October 2012 and was treated in [place], [place]. The catheterization report is a little confusing since it describes grafts with different locations than those described at the time of his bypass procedure, however it appears that he has lost the sequential bypass to the oblique marginal artery. A more recent nuclear stress test (May 2014) showed that his left ventricular ejection fraction was down to 42% with inferolateral and anterolateral scar and hypokinesia but without any reversible ischemic abnormalities.  I was concerned that he had possibly developed disease in the sequential saphenous vein graft to the PDA/PLA based on that report and recommended coronary angiography but he wanted to proceed with conservative management. He has not had any serious event since that time. As more and more time passes since that last  evaluation the urgency of coronary angiography/revascularization becomes less and less.  Past Medical History  Diagnosis Date  . Arthritis   . Hypertension   . CAD (coronary artery disease)   . Old myocardial infarct   . S/P CABG x 5 08/29/1984    LIMA to LAD,SVG to diagonal,SVG to OM,SVG to PDA and PLA  . Tremor, essential   . Hyperlipidemia     Past Surgical History  Procedure Laterality Date  . Coronary artery bypass graft  1986    x5 LIMA to LAD,SVG to diagonal,SVG to OM,SVG to PDA and PLA  . Coronary stent placement    . Cholecystectomy  10/1990  . Inguinal hernia repair  04/25/2012    right- laparoscopic  . US echocardiography  12/27/2008    concentric LVH,mild to mod MR, TR and pulmonary hypertension. EF 45-50%  . R/p myoview  12/27/2008    no ischemia     Current Outpatient Prescriptions  Medication Sig Dispense Refill  . aspirin 81 MG tablet Take 81 mg by mouth daily.    [name] atorvastatin (LIPITOR) 80 MG tablet Take 1/2 to 1 tablet daily for cholesterol. (Patient taking differently: Take 80 mg by mouth daily at 6 PM. ) 80 tablet 30  . buPROPion (WELLBUTRIN XL) 300 MG 24 hr tablet TAKE ONE TABLET BY MOUTH ONCE DAILY FOR  MOOD 90 tablet 0  . diazepam (VALIUM) 5 MG tablet TAKE 1/2-1 TAB THREE TIMES A DAY AS NEEDED FOR MUSCLE SPASM 90 tablet 2  . Digestive Enzymes CAPS Take 1 capsule by mouth as needed.    [name]  fish oil-omega-3 fatty acids 1000 MG capsule Take 1 g by mouth daily. Take 3 daily    . Magnesium Chloride (MAGNESIUM DR PO) Take by mouth daily.    . metoprolol succinate (TOPROL-XL) 25 MG 24 hr tablet Take 1 tablet (25 mg total) by mouth daily. 90 tablet 0  . nitroGLYCERIN (NITROSTAT) 0.4 MG SL tablet Place 0.4 mg under the tongue every 5 (five) minutes as needed for chest pain.    [name] Specialty Vitamins Products (PROSTATE) TABS Take 2 tablets by mouth.    [name] amLODipine (NORVASC) 2.5 MG tablet Take 1 tablet (2.5 mg total) by mouth daily. 90 tablet 3   No current  facility-administered medications for this visit.    Allergies:   Erythromycin    Social History:  The patient  reports that he quit smoking about 44 years ago. He has never used smokeless tobacco. He reports that he does not drink alcohol or use illicit drugs.   Family History:  The patient's family history includes Cancer in his brother and father.    ROS:  Please see the history of present illness.    Otherwise, review of systems positive for none.   All other systems are reviewed and negative.    PHYSICAL EXAM: VS:  BP 140/68 mmHg  Pulse 56  Ht 5\' 6"  (1.676 m)  Wt 172 lb (78.019 kg)  BMI 27.77 kg/m2 , BMI Body mass index is 27.77 kg/(m^2).  General: Alert, oriented x3, no distress Head: no evidence of trauma, PERRL, EOMI, no exophtalmos or lid lag, no myxedema, no xanthelasma; normal ears, nose and oropharynx Neck: normal jugular venous pulsations and no hepatojugular reflux; brisk carotid pulses without delay and no carotid bruits Chest: clear to auscultation, no signs of consolidation by percussion or palpation, normal fremitus, symmetrical and full respiratory excursions, healed sternotomy scar Cardiovascular: normal position and quality of the apical impulse, regular rhythm, normal first and paradoxically split second heart sounds, no murmurs, rubs or gallops Abdomen: no tenderness or distention, no masses by palpation, no abnormal pulsatility or arterial bruits, normal bowel sounds, no hepatosplenomegaly Extremities: no clubbing, cyanosis or edema; 2+ radial, ulnar and brachial pulses bilaterally; 2+ right femoral, posterior tibial and dorsalis pedis pulses; 2+ left femoral, posterior tibial and dorsalis pedis pulses; no subclavian or femoral bruits Neurological: grossly nonfocal Psych: euthymic mood, full affect   EKG:  EKG is ordered today. The ekg ordered today demonstrates sinus bradycardia, old left bundle branch block   Recent Labs: 01/23/2015: ALT 26; BUN 15;  Creat 0.96; Hemoglobin 17.0; Magnesium 2.1; Platelets 204; Potassium 4.5; Sodium 141; TSH 0.496    Lipid Panel    Component Value Date/Time   CHOL 185 01/23/2015 1149   TRIG 113 01/23/2015 1149   HDL 49 01/23/2015 1149   CHOLHDL 3.8 01/23/2015 1149   VLDL 23 01/23/2015 1149   LDLCALC 113* 01/23/2015 1149      Wt Readings from Last 3 Encounters:  03/03/15 172 lb (78.019 kg)  01/23/15 166 lb (75.297 kg)  10/16/14 169 lb 9.6 oz (76.93 kg)     .   ASSESSMENT AND PLAN:  1. CAD status post CABG with exertional angina pectoris. His pattern of angina has been quite stable for several years now. Offered enhanced therapy for better symptom relief with long-acting nitrates, but he recalls severe headaches with these agents. Will try amlodipine 2.5 mg daily. Would not increase the beta blocker since he has bradycardia and intraventricular conduction disease.  2. Ischemic cardiomyopathy,  without overt heart failure  3. Hyperlipidemia, target LDL less than 100, preferably less than 70. Usually takes only half of the prescribed dose of statin since he is worried about side effects.  4. History of nonsustained ventricular tachycardia, incidentally identified on event monitor. No symptoms of ventricular tachycardia. Continue beta blocker as long as he does not have serious bradycardia.    Current medicines are reviewed at length with the patient today.  The patient does not have concerns regarding medicines.  Labs/ tests ordered today include:  Orders Placed This Encounter  Procedures  . EKG 12-Lead    Patient Instructions  Medication Instructions:   START AMLODIPINE 2.5MG  DAIILY  Follow-Up:  ONE YEAR         SignedSanda Klein, MD  03/05/2015 2:24 PM    Sanda Klein, MD, [facility] [phone] office [phone] pager

## 2015-03-03 NOTE — Patient Instructions (Signed)
Medication Instructions:   START AMLODIPINE 2.5MG  DAIILY  Follow-Up:  ONE YEAR

## 2015-03-08 ENCOUNTER — Other Ambulatory Visit: Payer: Self-pay | Admitting: Internal Medicine

## 2015-03-10 ENCOUNTER — Other Ambulatory Visit: Payer: Self-pay | Admitting: Internal Medicine

## 2015-04-10 DIAGNOSIS — M9904 Segmental and somatic dysfunction of sacral region: Secondary | ICD-10-CM | POA: Diagnosis not present

## 2015-04-10 DIAGNOSIS — M9903 Segmental and somatic dysfunction of lumbar region: Secondary | ICD-10-CM | POA: Diagnosis not present

## 2015-04-10 DIAGNOSIS — M5417 Radiculopathy, lumbosacral region: Secondary | ICD-10-CM | POA: Diagnosis not present

## 2015-04-10 DIAGNOSIS — G603 Idiopathic progressive neuropathy: Secondary | ICD-10-CM | POA: Diagnosis not present

## 2015-04-10 DIAGNOSIS — M9905 Segmental and somatic dysfunction of pelvic region: Secondary | ICD-10-CM | POA: Diagnosis not present

## 2015-04-10 DIAGNOSIS — M5136 Other intervertebral disc degeneration, lumbar region: Secondary | ICD-10-CM | POA: Diagnosis not present

## 2015-04-14 ENCOUNTER — Ambulatory Visit: Payer: Self-pay | Admitting: Internal Medicine

## 2015-04-15 ENCOUNTER — Other Ambulatory Visit: Payer: Self-pay | Admitting: Chiropractor

## 2015-04-15 ENCOUNTER — Ambulatory Visit
Admission: RE | Admit: 2015-04-15 | Discharge: 2015-04-15 | Disposition: A | Discharge status: Home / Self Care | Payer: Medicare Other | Source: Ambulatory Visit | Attending: Chiropractor | Admitting: Chiropractor

## 2015-04-15 DIAGNOSIS — M545 Low back pain: Secondary | ICD-10-CM

## 2015-04-15 DIAGNOSIS — M5116 Intervertebral disc disorders with radiculopathy, lumbar region: Secondary | ICD-10-CM | POA: Diagnosis not present

## 2015-05-01 ENCOUNTER — Ambulatory Visit (INDEPENDENT_AMBULATORY_CARE_PROVIDER_SITE_OTHER): Payer: Medicare Other | Admitting: Internal Medicine

## 2015-05-01 ENCOUNTER — Encounter: Payer: Self-pay | Admitting: Internal Medicine

## 2015-05-01 VITALS — BP 126/68 | HR 60 | Temp 97.5°F | Resp 16 | Ht 66.5 in | Wt 168.2 lb

## 2015-05-01 DIAGNOSIS — Z79899 Other long term (current) drug therapy: Secondary | ICD-10-CM | POA: Diagnosis not present

## 2015-05-01 DIAGNOSIS — Z2829 Immunization not carried out because of patient decision for other reason: Secondary | ICD-10-CM

## 2015-05-01 DIAGNOSIS — R7303 Prediabetes: Secondary | ICD-10-CM

## 2015-05-01 DIAGNOSIS — D485 Neoplasm of uncertain behavior of skin: Secondary | ICD-10-CM

## 2015-05-01 DIAGNOSIS — E782 Mixed hyperlipidemia: Secondary | ICD-10-CM

## 2015-05-01 DIAGNOSIS — I2583 Coronary atherosclerosis due to lipid rich plaque: Secondary | ICD-10-CM

## 2015-05-01 DIAGNOSIS — I10 Essential (primary) hypertension: Secondary | ICD-10-CM

## 2015-05-01 DIAGNOSIS — I2510 Atherosclerotic heart disease of native coronary artery without angina pectoris: Secondary | ICD-10-CM | POA: Diagnosis not present

## 2015-05-01 DIAGNOSIS — Z6826 Body mass index (BMI) 26.0-26.9, adult: Secondary | ICD-10-CM | POA: Diagnosis not present

## 2015-05-01 DIAGNOSIS — E559 Vitamin D deficiency, unspecified: Secondary | ICD-10-CM

## 2015-05-01 DIAGNOSIS — D492 Neoplasm of unspecified behavior of bone, soft tissue, and skin: Secondary | ICD-10-CM

## 2015-05-01 DIAGNOSIS — R7309 Other abnormal glucose: Secondary | ICD-10-CM

## 2015-05-01 LAB — BASIC METABOLIC PANEL WITH GFR
BUN: 15 mg/dL (ref 7–25)
CO2: 27 mmol/L (ref 20–31)
Calcium: 9.2 mg/dL (ref 8.6–10.3)
Chloride: 101 mmol/L (ref 98–110)
Creat: 0.94 mg/dL (ref 0.70–1.11)
GFR, Est African American: 88 mL/min (ref >=60)
GFR, Est Non African American: 76 mL/min (ref >=60)
Glucose, Bld: 87 mg/dL (ref 65–99)
Potassium: 4.6 mmol/L (ref 3.5–5.3)
Sodium: 136 mmol/L (ref 135–146)

## 2015-05-01 LAB — CBC WITH DIFFERENTIAL/PLATELET
Basophils Absolute: 0 10*3/uL (ref 0.0–0.1)
Basophils Relative: 0 % (ref 0–1)
Eosinophils Absolute: 0.1 10*3/uL (ref 0.0–0.7)
Eosinophils Relative: 2 % (ref 0–5)
HCT: 46.3 % (ref 39.0–52.0)
Hemoglobin: 16.1 g/dL (ref 13.0–17.0)
Lymphocytes Relative: 39 % (ref 12–46)
Lymphs Abs: 2.8 10*3/uL (ref 0.7–4.0)
MCH: 31.8 pg (ref 26.0–34.0)
MCHC: 34.8 g/dL (ref 30.0–36.0)
MCV: 91.3 fL (ref 78.0–100.0)
MPV: 10 fL (ref 8.6–12.4)
Monocytes Absolute: 0.6 10*3/uL (ref 0.1–1.0)
Monocytes Relative: 9 % (ref 3–12)
Neutro Abs: 3.6 10*3/uL (ref 1.7–7.7)
Neutrophils Relative %: 50 % (ref 43–77)
Platelets: 197 10*3/uL (ref 150–400)
RBC: 5.07 MIL/uL (ref 4.22–5.81)
RDW: 14.3 % (ref 11.5–15.5)
WBC: 7.2 10*3/uL (ref 4.0–10.5)

## 2015-05-01 LAB — HEPATIC FUNCTION PANEL
ALT: 24 U/L (ref 9–46)
AST: 25 U/L (ref 10–35)
Albumin: 4.1 g/dL (ref 3.6–5.1)
Alkaline Phosphatase: 58 U/L (ref 40–115)
Bilirubin, Direct: 0.1 mg/dL (ref <=0.2)
Indirect Bilirubin: 0.6 mg/dL (ref 0.2–1.2)
Total Bilirubin: 0.7 mg/dL (ref 0.2–1.2)
Total Protein: 6.6 g/dL (ref 6.1–8.1)

## 2015-05-01 LAB — TSH: TSH: 0.694 u[IU]/mL (ref 0.350–4.500)

## 2015-05-01 LAB — MAGNESIUM: Magnesium: 1.9 mg/dL (ref 1.5–2.5)

## 2015-05-01 LAB — LIPID PANEL
Cholesterol: 175 mg/dL (ref 125–200)
HDL: 46 mg/dL (ref >=40)
LDL Cholesterol: 111 mg/dL (ref <130)
Total CHOL/HDL Ratio: 3.8 Ratio (ref <=5.0)
Triglycerides: 90 mg/dL (ref <150)
VLDL: 18 mg/dL (ref <30)

## 2015-05-01 NOTE — Patient Instructions (Signed)

## 2015-05-01 NOTE — Progress Notes (Signed)
Patient ID: Paul Frazier, male   DOB: 02/24/1933, 81 y.o.   MRN: [id number]   This very nice 81 y.o. MWM presents for 3 month follow up with Hypertension, Hyperlipidemia, Pre-Diabetes and Vitamin D Deficiency. Patient refused fluvax stating "Don't need it".    Patient is treated for HTN & BP has been controlled at home. Today's BP: 126/68 mmHg. Patient did undergo a CABG 20 years ago and is very resistant to f/u with a cardiologist as he states "I've don't fine for 20 years and don't need'm".  Patient has had no complaints of any cardiac type chest pain, palpitations, dyspnea/orthopnea/PND, dizziness, claudication, or dependent edema.   Hyperlipidemia is not controlled with diet & meds - which patient is very vague about whether he is taking. Patient denies myalgias or other med SE's. Today's Lipids are not at goal with Cholesterol 175; HDL 46; LDL Cholesterol 111; & Triglycerides 90.   Also, the patient has history of PreDiabetes and has had no symptoms of reactive hypoglycemia, diabetic polys, paresthesias or visual blurring.  Last A1c was 5.9% on  01/23/2015.   Further, the patient also has history of Vitamin D Deficiency and supplements vitamin D without any suspected side-effects. Last vitamin D was 57 on 01/23/2015.   Medication Sig  . aspirin 81 MG tablet Take 81 mg by mouth daily.  [name] buPROPion (WELLBUTRIN XL) 300 MG   TAKE ONE TABLET BY MOUTH ONCE DAILY FOR  MOOD  . diazepam (VALIUM) 5 MG tablet TAKE 1/2-1 TAB THREE TIMES A DAY AS NEEDED FOR MUSCLE SPASM  . Digestive Enzymes CAPS Take 1 capsule by mouth as needed.  . fish oil-omega-3 fatty acids 1000 MG capsule Take 1 g by mouth daily. Take 3 daily  . Magnesium Chloride (MAGNESIUM DR PO) Take by mouth daily.  . nitroGLYCERIN (NITROSTAT) 0.4 MG SL tablet Place 0.4 mg under the tongue every 5 (five) minutes as needed for chest pain.  [name] Specialty Vitamins Products (PROSTATE) TABS Take 2 tablets by mouth.  [name] amLODipine (NORVASC) 2.5 MG tablet Take  1 tablet (2.5 mg total) by mouth daily.  [name] atorvastatin (LIPITOR) 80 MG tablet Take 1/2 to 1 tablet daily for cholesterol.  . metoprolol succinate -XL 25 MG 24 hr tablet TAKE ONE TABLET (25 MG TOTAL) BY MOUTH ONCE DAILY   Allergies  Allergen Reactions  . Erythromycin Nausea Only   PMHx:   Past Medical History  Diagnosis Date  . Arthritis   . Hypertension   . CAD (coronary artery disease)   . Old myocardial infarct   . S/P CABG x 5 08/29/1984    LIMA to LAD,SVG to diagonal,SVG to OM,SVG to PDA and PLA  . Tremor, essential   . Hyperlipidemia    Immunization History  Administered Date(s) Administered  . Pneumococcal Polysaccharide-23 10/16/2014  . Td 07/20/2011   Past Surgical History  Procedure Laterality Date  . Coronary artery bypass graft  1986    x5 LIMA to LAD,SVG to diagonal,SVG to OM,SVG to PDA and PLA  . Coronary stent placement    . Cholecystectomy  10/1990  . Inguinal hernia repair  04/25/2012    right- laparoscopic  . US echocardiography  12/27/2008    concentric LVH,mild to mod MR, TR and pulmonary hypertension. EF 45-50%  . R/p myoview  12/27/2008    no ischemia   FHx:    Reviewed / unchanged  SHx:    Reviewed / unchanged  Systems Review:  Constitutional: Denies fever, chills, wt changes,  headaches, insomnia, fatigue, night sweats, change in appetite. Eyes: Denies redness, blurred vision, diplopia, discharge, itchy, watery eyes.  ENT: Denies discharge, congestion, post nasal drip, epistaxis, sore throat, earache, hearing loss, dental pain, tinnitus, vertigo, sinus pain, snoring.  CV: Denies chest pain, palpitations, irregular heartbeat, syncope, dyspnea, diaphoresis, orthopnea, PND, claudication or edema. Respiratory: denies cough, dyspnea, DOE, pleurisy, hoarseness, laryngitis, wheezing.  Gastrointestinal: Denies dysphagia, odynophagia, heartburn, reflux, water brash, abdominal pain or cramps, nausea, vomiting, bloating, diarrhea, constipation, hematemesis,  melena, hematochezia  or hemorrhoids. Genitourinary: Denies dysuria, frequency, urgency, nocturia, hesitancy, discharge, hematuria or flank pain. Musculoskeletal: Denies arthralgias, myalgias, stiffness, jt. swelling, pain, limping or strain/sprain.  Skin: Denies pruritus, rash, hives, warts, acne, eczema or change in skin lesion(s). Neuro: No weakness, tremor, incoordination, spasms, paresthesia or pain. Psychiatric: Denies confusion, memory loss or sensory loss. Endo: Denies change in weight, skin or hair change.  Heme/Lymph: No excessive bleeding, bruising or enlarged lymph nodes.  Physical Exam  BP 126/68 mmHg  Pulse 60  Temp(Src) 97.5 F (36.4 C)  Resp 16  Ht 5' 6.5" (1.689 m)  Wt 168 lb 3.2 oz (76.295 kg)  BMI 26.74 kg/m2  Appears well nourished and in no distress. Eyes: PERRLA, EOMs, conjunctiva no swelling or erythema. Sinuses: No frontal/maxillary tenderness ENT/Mouth: EAC's clear, TM's nl w/o erythema, bulging. Nares clear w/o erythema, swelling, exudates. Oropharynx clear without erythema or exudates. Oral hygiene is good. Tongue normal, non obstructing. Hearing intact.  Neck: Supple. Thyroid nl. Car 2+/2+ without bruits, nodes or JVD. Chest: Respirations nl with BS clear & equal w/o rales, rhonchi, wheezing or stridor.  Cor: Heart sounds normal w/ regular rate and rhythm without sig. murmurs, gallops, clicks, or rubs. Peripheral pulses normal and equal  without edema.  Abdomen: Soft & bowel sounds normal. Non-tender w/o guarding, rebound, hernias, masses, or organomegaly.  Lymphatics: Unremarkable.  Musculoskeletal: Full ROM all peripheral extremities, joint stability, 5/5 strength, and normal gait.  Skin: Warm, dry without exposed rashes, lesions or ecchymosis apparent.  Neuro: Cranial nerves intact, reflexes equal bilaterally. Sensory-motor testing grossly intact. Tendon reflexes grossly intact.  Pysch: Alert & oriented x 3.  Insight and judgement nl & appropriate. No  ideations.  Assessment and Plan:  1. Essential hypertension  - TSH  2. Hyperlipidemia  - Lipid panel  3. Prediabetes  - Insulin, random  4. Vitamin D deficiency  - Vit D  25 hydroxy  5. Other abnormal glucose  - Hemoglobin A1c  6. Coronary artery disease due to lipid rich plaque   7. BMI 26.74,  adult   8. Refusal of influenza vaccine by provider   9. Medication management  - CBC with Differential/Platelet - BASIC METABOLIC PANEL WITH GFR - Hepatic function panel - Magnesium   Recommended regular exercise, BP monitoring, weight control, and discussed med and SE's. Recommended labs to assess and monitor clinical status. Further disposition pending results of labs. Over 30 minutes of exam, counseling, chart review was performed

## 2015-05-02 LAB — HEMOGLOBIN A1C
Hgb A1c MFr Bld: 5.8 % — ABNORMAL HIGH (ref <5.7)
Mean Plasma Glucose: 120 mg/dL — ABNORMAL HIGH (ref <117)

## 2015-05-02 LAB — INSULIN, RANDOM: Insulin: 4.5 u[IU]/mL (ref 2.0–19.6)

## 2015-05-02 LAB — VITAMIN D 25 HYDROXY (VIT D DEFICIENCY, FRACTURES): Vit D, 25-Hydroxy: 41 ng/mL (ref 30–100)

## 2015-05-12 ENCOUNTER — Telehealth: Payer: Self-pay | Admitting: Cardiovascular Disease

## 2015-05-12 ENCOUNTER — Other Ambulatory Visit: Payer: Self-pay

## 2015-05-12 DIAGNOSIS — G459 Transient cerebral ischemic attack, unspecified: Secondary | ICD-10-CM

## 2015-05-12 DIAGNOSIS — Z8679 Personal history of other diseases of the circulatory system: Secondary | ICD-10-CM

## 2015-05-12 MED ORDER — BUPROPION HCL ER (XL) 300 MG PO TB24
ORAL_TABLET | ORAL | Status: DC
Start: 2015-05-12 — End: 2016-05-05

## 2015-05-12 NOTE — Telephone Encounter (Signed)
Length of call: 24 minutes  Wife of patient gave me a detailed narrative of what happened yesterday. Patient came home from visiting son yesterday afternoon. He prepared meal for himself and for wife. She explains that she is limited in mobility due to recent surgeries.  She states after patient finished his meal, he came to get her bowl and when she explained that she wasn't finished eating, he stood there, apparently confused before acknowledging.  She states he was talking slowly in monotone, "not his personality", non-expressive face. Looked "far out". She notes one eye looked half-closed, the other blinked. She asked him at the time if he felt unusual, to which he replied no. She reports that he swayed a little when walking to living room to sit down. In addition to noting gait change, she states she had him touch his nose.  Symptoms apparently resolved later in that evening, but she could not give me a solid timeline.  She notes he looks normal today, is acting within his usual norms, but of note, does not recall episode last night. She called son to inquire as to whether he was acting unusually earlier in the afternoon on 10/23, states he noticed nothing out of ordinary.  She gave him 4x 81mg  ASA since "that's what you do when there is a heart problem". She is aware, with teaching reinforcement, that patient symptoms could be stroke related and evaluation would focus on rule out for this.  She call this afternoon requesting advice/evaluation from Dr. Sallyanne Kuster.  Wife explains that yesterday she did not call EMS but considered it.  However, she was too afraid that the patient would be taken to the hospital and evaluated without her at bedside. She requested that Dr. Sallyanne Kuster be informed of patient's experience and asked that he order MRI, CT scan, "whatever he thinks is necessary". Informed her that I would gladly defer to his advice on this, but I advised strongly for emergency evaluation  if symptoms return.  Caller voiced understanding of recommendations thus far.

## 2015-05-12 NOTE — Telephone Encounter (Signed)
A TIA is a possibility, but I am more concerned that he may have had an arrhythmia. He has previously had asymptomatic nonsustained ventricular tachycardia.  The fact that he does not recall the events also suggest this is more likely global reduction in blood flow to the brain rather than a stroke/TIA which would have more focal symptoms. I would like him to wear a 14 day event monitor.  Also please order a head CT without contrast for transient ischemic attack. Please also arrange follow-up.

## 2015-05-12 NOTE — Telephone Encounter (Signed)
Pt's wife called in stating that yesterday evening the pt had an episode to where he was dazed and confused, she said that the his mouth was slightly twisted. She says that this continued for about 20-25 mins. She would like for the pt to come and and be evaluated. Please f/u   Thanks

## 2015-05-13 ENCOUNTER — Ambulatory Visit
Admission: RE | Admit: 2015-05-13 | Discharge: 2015-05-13 | Disposition: A | Discharge status: Home / Self Care | Payer: Medicare Other | Source: Ambulatory Visit | Attending: Cardiovascular Disease | Admitting: Cardiovascular Disease

## 2015-05-13 ENCOUNTER — Ambulatory Visit (INDEPENDENT_AMBULATORY_CARE_PROVIDER_SITE_OTHER): Payer: Medicare Other

## 2015-05-13 ENCOUNTER — Other Ambulatory Visit: Payer: Self-pay | Admitting: Cardiovascular Disease

## 2015-05-13 ENCOUNTER — Telehealth: Payer: Self-pay | Admitting: *Deleted

## 2015-05-13 DIAGNOSIS — R55 Syncope and collapse: Secondary | ICD-10-CM | POA: Diagnosis not present

## 2015-05-13 DIAGNOSIS — G459 Transient cerebral ischemic attack, unspecified: Secondary | ICD-10-CM

## 2015-05-13 DIAGNOSIS — R4701 Aphasia: Secondary | ICD-10-CM | POA: Diagnosis not present

## 2015-05-13 DIAGNOSIS — R5383 Other fatigue: Secondary | ICD-10-CM | POA: Diagnosis not present

## 2015-05-13 DIAGNOSIS — Z8679 Personal history of other diseases of the circulatory system: Secondary | ICD-10-CM | POA: Diagnosis not present

## 2015-05-13 NOTE — Telephone Encounter (Signed)
Patient called and asked for an RX of Vitamin D, but per Dr Melford Aase, patient should take OTC Vitamin D3 5000 units 2 capsule per day.  Patient aware.

## 2015-05-13 NOTE — Telephone Encounter (Signed)
Called pt home, spoke to pt/wife, Dr. Victorino December recommendations & orders set up. F/u visit scheduled.  Pt coming in today for monitor after CT.

## 2015-05-13 NOTE — Progress Notes (Unsigned)
Event monitor per Dr. Sallyanne Kuster.

## 2015-05-13 NOTE — Patient Instructions (Addendum)
Follow up with Dr. Sallyanne Kuster on 06/05/15 (Thursday) at 10:15am. Please call for any questions regarding monitor. We will call once your head CT results are posted.    Cardiac Event Monitoring   A cardiac event monitor is a small recording device used to help detect abnormal heart rhythms (arrhythmias). The monitor is used to record heart rhythm when noticeable symptoms such as the following occur:  Fast heartbeats (palpitations), such as heart racing or fluttering.  Dizziness.  Fainting or light-headedness.  Unexplained weakness. The monitor is wired to two electrodes placed on your chest. Electrodes are flat, sticky disks that attach to your skin. The monitor can be worn for up to 30 days. You will wear the monitor at all times, except when bathing.  HOW TO USE YOUR CARDIAC EVENT MONITOR A technician will prepare your chest for the electrode placement. The technician will show you how to place the electrodes, how to work the monitor, and how to replace the batteries. Take time to practice using the monitor before you leave the office. Make sure you understand how to send the information from the monitor to your health care provider. This requires a telephone with a landline, not a cell phone. You need to:  Wear your monitor at all times, except when you are in water:  Do not get the monitor wet.  Take the monitor off when bathing. Do not swim or use a hot tub with it on.  Keep your skin clean. Do not put body lotion or moisturizer on your chest.  Change the electrodes daily or any time they stop sticking to your skin. You might need to use tape to keep them on.  It is possible that your skin under the electrodes could become irritated. To keep this from happening, try to put the electrodes in slightly different places on your chest. However, they must remain in the area under your left breast and in the upper right section of your chest.  Make sure the monitor is safely clipped to  your clothing or in a location close to your body that your health care provider recommends.  Press the button to record when you feel symptoms of heart trouble, such as dizziness, weakness, light-headedness, palpitations, thumping, shortness of breath, unexplained weakness, or a fluttering or racing heart. The monitor is always on and records what happened slightly before you pressed the button, so do not worry about being too late to get good information.  Keep a diary of your activities, such as walking, doing chores, and taking medicine. It is especially important to note what you were doing when you pushed the button to record your symptoms. This will help your health care provider determine what might be contributing to your symptoms. The information stored in your monitor will be reviewed by your health care provider alongside your diary entries.  Send the recorded information as recommended by your health care provider. It is important to understand that it will take some time for your health care provider to process the results.  Change the batteries as recommended by your health care provider. SEEK IMMEDIATE MEDICAL CARE IF:   You have chest pain.  You have extreme difficulty breathing or shortness of breath.  You develop a very fast heartbeat that persists.  You develop dizziness that does not go away.  You faint or constantly feel you are about to faint.   This information is not intended to replace advice given to you by your health care provider.  Make sure you discuss any questions you have with your health care provider.   Document Released: 04/13/2008 Document Revised: 07/26/2014 Document Reviewed: 01/01/2013 Elsevier Interactive Patient Education [employer].

## 2015-05-13 NOTE — Telephone Encounter (Signed)
Monitor visit encounter completed, patient scheduled for head CT today and results pending, also scheduled for follow up and acknowledge time/provider/location.  Will close this encounter.

## 2015-05-16 ENCOUNTER — Other Ambulatory Visit: Payer: Medicare Other

## 2015-05-21 ENCOUNTER — Telehealth: Payer: Self-pay | Admitting: Cardiovascular Disease

## 2015-05-21 NOTE — Telephone Encounter (Signed)
Returned call to patient no answer.LMTC. 

## 2015-05-21 NOTE — Telephone Encounter (Signed)
Pt called in per answering service at 10:19 on 11/1 stating that his monitor gave him a message that he did not understand. He would like to speak with someone about this. Please f/u with him  Thanks

## 2015-05-21 NOTE — Telephone Encounter (Signed)
Received a call from patient he stated last night monitor said go to a cellular site to receive a message.Advised to call [employer].Stated he was doing good no symptoms.

## 2015-05-27 ENCOUNTER — Encounter (HOSPITAL_COMMUNITY): Payer: Self-pay

## 2015-05-27 ENCOUNTER — Emergency Department (HOSPITAL_COMMUNITY)
Admission: EM | Admit: 2015-05-27 | Discharge: 2015-05-28 | Disposition: A | Discharge status: Home / Self Care | Payer: Medicare Other | Attending: Emergency Medicine | Admitting: Emergency Medicine

## 2015-05-27 ENCOUNTER — Emergency Department (HOSPITAL_COMMUNITY): Payer: Medicare Other

## 2015-05-27 DIAGNOSIS — I10 Essential (primary) hypertension: Secondary | ICD-10-CM | POA: Diagnosis not present

## 2015-05-27 DIAGNOSIS — M1990 Unspecified osteoarthritis, unspecified site: Secondary | ICD-10-CM | POA: Diagnosis not present

## 2015-05-27 DIAGNOSIS — Z7982 Long term (current) use of aspirin: Secondary | ICD-10-CM | POA: Insufficient documentation

## 2015-05-27 DIAGNOSIS — R1084 Generalized abdominal pain: Secondary | ICD-10-CM | POA: Diagnosis not present

## 2015-05-27 DIAGNOSIS — N200 Calculus of kidney: Secondary | ICD-10-CM | POA: Insufficient documentation

## 2015-05-27 DIAGNOSIS — I2510 Atherosclerotic heart disease of native coronary artery without angina pectoris: Secondary | ICD-10-CM | POA: Diagnosis not present

## 2015-05-27 DIAGNOSIS — R1032 Left lower quadrant pain: Secondary | ICD-10-CM | POA: Diagnosis not present

## 2015-05-27 DIAGNOSIS — Z9861 Coronary angioplasty status: Secondary | ICD-10-CM | POA: Diagnosis not present

## 2015-05-27 DIAGNOSIS — Z8669 Personal history of other diseases of the nervous system and sense organs: Secondary | ICD-10-CM | POA: Insufficient documentation

## 2015-05-27 DIAGNOSIS — Z9049 Acquired absence of other specified parts of digestive tract: Secondary | ICD-10-CM | POA: Diagnosis not present

## 2015-05-27 DIAGNOSIS — I252 Old myocardial infarction: Secondary | ICD-10-CM | POA: Insufficient documentation

## 2015-05-27 DIAGNOSIS — Z8639 Personal history of other endocrine, nutritional and metabolic disease: Secondary | ICD-10-CM | POA: Diagnosis not present

## 2015-05-27 DIAGNOSIS — Z951 Presence of aortocoronary bypass graft: Secondary | ICD-10-CM | POA: Insufficient documentation

## 2015-05-27 DIAGNOSIS — Z79899 Other long term (current) drug therapy: Secondary | ICD-10-CM | POA: Insufficient documentation

## 2015-05-27 DIAGNOSIS — R109 Unspecified abdominal pain: Secondary | ICD-10-CM

## 2015-05-27 DIAGNOSIS — N201 Calculus of ureter: Secondary | ICD-10-CM | POA: Diagnosis not present

## 2015-05-27 DIAGNOSIS — N202 Calculus of kidney with calculus of ureter: Secondary | ICD-10-CM | POA: Diagnosis not present

## 2015-05-27 DIAGNOSIS — Z87891 Personal history of nicotine dependence: Secondary | ICD-10-CM | POA: Insufficient documentation

## 2015-05-27 LAB — URINE MICROSCOPIC-ADD ON

## 2015-05-27 LAB — BASIC METABOLIC PANEL
Anion gap: 10 (ref 5–15)
BUN: 17 mg/dL (ref 6–20)
CO2: 24 mmol/L (ref 22–32)
Calcium: 9.2 mg/dL (ref 8.9–10.3)
Chloride: 105 mmol/L (ref 101–111)
Creatinine, Ser: 1.05 mg/dL (ref 0.61–1.24)
GFR calc Af Amer: >60 mL/min (ref >60)
GFR calc non Af Amer: >60 mL/min (ref >60)
Glucose, Bld: 103 mg/dL — ABNORMAL HIGH (ref 65–99)
Potassium: 4 mmol/L (ref 3.5–5.1)
Sodium: 139 mmol/L (ref 135–145)

## 2015-05-27 LAB — URINALYSIS, ROUTINE W REFLEX MICROSCOPIC
Bilirubin Urine: NEGATIVE
Glucose, UA: NEGATIVE mg/dL
Ketones, ur: 15 mg/dL — AB
Leukocytes, UA: NEGATIVE
Nitrite: NEGATIVE
Protein, ur: 30 mg/dL — AB
Specific Gravity, Urine: 1.027 (ref 1.005–1.030)
Urobilinogen, UA: 1 mg/dL (ref 0.0–1.0)
pH: 5.5 (ref 5.0–8.0)

## 2015-05-27 MED ORDER — MORPHINE SULFATE (PF) 2 MG/ML IV SOLN
2.0000 mg | Freq: Once | INTRAVENOUS | Status: AC
  Administered 2015-05-27: 2 mg via INTRAVENOUS
  Filled 2015-05-27: qty 1

## 2015-05-27 MED ORDER — ONDANSETRON HCL 4 MG PO TABS: 4.0000 mg | ORAL_TABLET | Freq: Four times a day (QID) | ORAL | Status: DC

## 2015-05-27 MED ORDER — OXYCODONE-ACETAMINOPHEN 5-325 MG PO TABS: 1.0000 | ORAL_TABLET | Freq: Four times a day (QID) | ORAL | Status: DC | PRN

## 2015-05-27 MED ORDER — OXYCODONE-ACETAMINOPHEN 5-325 MG PO TABS
1.0000 | ORAL_TABLET | Freq: Once | ORAL | Status: AC
  Administered 2015-05-27: 1 via ORAL
  Filled 2015-05-27: qty 1

## 2015-05-27 NOTE — ED Notes (Signed)
MD at bedside. 

## 2015-05-27 NOTE — Discharge Instructions (Signed)
- Call your urologist to schedule a follow-up appointment. - Strain all urine and urine until stone passes. Take Percocet as needed for pain and Zofran as needed for nausea. - Scheduling follow-up appointment with your primary care doctor for a follow-up kidney CT or MRI as recommended by radiology for cyst.  See their note below. Cystic lesion in the right kidney measures at least 3.5 cm with exophytic component and probable thin calcified septa. Recommend nonemergent renal protocol MRI or CT with and without contrast for characterization. - Return to emergency department with any new or concerning symptoms.   Kidney Stones Kidney stones (urolithiasis) are deposits that form inside your kidneys. The intense pain is caused by the stone moving through the urinary tract. When the stone moves, the ureter goes into spasm around the stone. The stone is usually passed in the urine.  CAUSES   A disorder that makes certain neck glands produce too much parathyroid hormone (primary hyperparathyroidism).  A buildup of uric acid crystals, similar to gout in your joints.  Narrowing (stricture) of the ureter.  A kidney obstruction present at birth (congenital obstruction).  Previous surgery on the kidney or ureters.  Numerous kidney infections. SYMPTOMS   Feeling sick to your stomach (nauseous).  Throwing up (vomiting).  Blood in the urine (hematuria).  Pain that usually spreads (radiates) to the groin.  Frequency or urgency of urination. DIAGNOSIS   Taking a history and physical exam.  Blood or urine tests.  CT scan.  Occasionally, an examination of the inside of the urinary bladder (cystoscopy) is performed. TREATMENT   Observation.  Increasing your fluid intake.  Extracorporeal shock wave lithotripsy--This is a noninvasive procedure that uses shock waves to break up kidney stones.  Surgery may be needed if you have severe pain or persistent obstruction. There are various  surgical procedures. Most of the procedures are performed with the use of small instruments. Only small incisions are needed to accommodate these instruments, so recovery time is minimized. The size, location, and chemical composition are all important variables that will determine the proper choice of action for you. Talk to your health care provider to better understand your situation so that you will minimize the risk of injury to yourself and your kidney.  HOME CARE INSTRUCTIONS   Drink enough water and fluids to keep your urine clear or pale yellow. This will help you to pass the stone or stone fragments.  Strain all urine through the provided strainer. Keep all particulate matter and stones for your health care provider to see. The stone causing the pain may be as small as a grain of salt. It is very important to use the strainer each and every time you pass your urine. The collection of your stone will allow your health care provider to analyze it and verify that a stone has actually passed. The stone analysis will often identify what you can do to reduce the incidence of recurrences.  Only take over-the-counter or prescription medicines for pain, discomfort, or fever as directed by your health care provider.  Keep all follow-up visits as told by your health care provider. This is important.  Get follow-up X-rays if required. The absence of pain does not always mean that the stone has passed. It may have only stopped moving. If the urine remains completely obstructed, it can cause loss of kidney function or even complete destruction of the kidney. It is your responsibility to make sure X-rays and follow-ups are completed. Ultrasounds of the  kidney can show blockages and the status of the kidney. Ultrasounds are not associated with any radiation and can be performed easily in a matter of minutes.  Make changes to your daily diet as told by your health care provider. You may be told to:  Limit  the amount of salt that you eat.  Eat 5 or more servings of fruits and vegetables each day.  Limit the amount of meat, poultry, fish, and eggs that you eat.  Collect a 24-hour urine sample as told by your health care provider.You may need to collect another urine sample every 6-12 months. SEEK MEDICAL CARE IF:  You experience pain that is progressive and unresponsive to any pain medicine you have been prescribed. SEEK IMMEDIATE MEDICAL CARE IF:   Pain cannot be controlled with the prescribed medicine.  You have a fever or shaking chills.  The severity or intensity of pain increases over 18 hours and is not relieved by pain medicine.  You develop a new onset of abdominal pain.  You feel faint or pass out.  You are unable to urinate.   This information is not intended to replace advice given to you by your health care provider. Make sure you discuss any questions you have with your health care provider.   Document Released: 07/05/2005 Document Revised: 03/26/2015 Document Reviewed: 12/06/2012 Elsevier Interactive Patient Education [employer].

## 2015-05-27 NOTE — ED Notes (Addendum)
Per [facility]- Pt c/o of left side flank pain.on set 3 hours ago. HX of the same. C/o Frequency and pain with urination. Fentanyl 150 mcg IVP given in route.

## 2015-05-27 NOTE — ED Notes (Signed)
Bed: WA06 Expected date:  Expected time:  Means of arrival:  Comments: Kidney stone

## 2015-05-27 NOTE — ED Provider Notes (Signed)
CSN: [id number]     Arrival date & time 05/27/15  1722 History   First MD Initiated Contact with Patient 05/27/15 1751     Chief Complaint  Patient presents with  . Flank Pain    LEFT SIDE   HPI  Paul Frazier is an 81 year old male with PMHx of HTN, CAD, arthritis and kidney stones x 2 presenting with left sided flank pain. Patient reports pain began approximately 3 hours before presentation to the emergency department. Pain came on acutely and is described as sharp pain. He states that the pain radiates towards his left groin. He states that the pain is in his left inguinal canal but does not extend into the testicles. He denies dysuria, hematuria, penile discharge or difficulty urinating. He does endorse increased frequency of urination but this has been ongoing for the past year. Patient reports that he has had 2 kidney stones in the past that he passed. He states that this feels similar to past kidney stones. He has a urologist that he follows with. He denies fevers, chills, dizziness, syncope, chest pain, shortness of breath, abdominal pain, and nausea, vomiting, diarrhea or constipation. He reports significant pain relief with fentanyl received via EMS.  Past Medical History  Diagnosis Date  . Arthritis   . Hypertension   . CAD (coronary artery disease)   . Old myocardial infarct   . S/P CABG x 5 08/29/1984    LIMA to LAD,SVG to diagonal,SVG to OM,SVG to PDA and PLA  . Tremor, essential   . Hyperlipidemia    Past Surgical History  Procedure Laterality Date  . Coronary artery bypass graft  1986    x5 LIMA to LAD,SVG to diagonal,SVG to OM,SVG to PDA and PLA  . Coronary stent placement    . Cholecystectomy  10/1990  . Inguinal hernia repair  04/25/2012    right- laparoscopic  . US echocardiography  12/27/2008    concentric LVH,mild to mod MR, TR and pulmonary hypertension. EF 45-50%  . R/p myoview  12/27/2008    no ischemia   Family History  Problem Relation Age of Onset  . Cancer  Father     bone  . Cancer Brother     lymphnode   Social History  Substance Use Topics  . Smoking status: Former Smoker    Quit date: 07/19/1970  . Smokeless tobacco: Never Used  . Alcohol Use: No    Review of Systems  Constitutional: Negative for fever and chills.  Gastrointestinal: Negative for nausea and vomiting.  Genitourinary: Positive for frequency and flank pain. Negative for dysuria, hematuria, decreased urine volume, discharge, scrotal swelling, difficulty urinating and testicular pain.  Skin: Negative for rash.  All other systems reviewed and are negative.     Allergies  Erythromycin  Home Medications   Prior to Admission medications   Medication Sig Start Date End Date Taking? Authorizing Provider  aspirin 81 MG tablet Take 81 mg by mouth daily.   Yes Historical Provider, MD  buPROPion (WELLBUTRIN XL) 300 MG 24 hr tablet TAKE ONE TABLET BY MOUTH ONCE DAILY FOR  MOOD 05/12/15  Yes Unk Pinto, MD  co-enzyme Q-10 30 MG capsule Take 60 mg by mouth daily.   Yes Historical Provider, MD  diazepam (VALIUM) 5 MG tablet TAKE 1/2-1 TAB THREE TIMES A DAY AS NEEDED FOR MUSCLE SPASM 11/25/14  Yes Unk Pinto, MD  Digestive Enzymes CAPS Take 1-2 capsules by mouth as needed (digestive function).    Yes Historical  Provider, MD  Magnesium Chloride (MAGNESIUM DR PO) Take 1 tablet by mouth daily.    Yes Historical Provider, MD  metoprolol succinate (TOPROL-XL) 25 MG 24 hr tablet TAKE ONE TABLET (25 MG TOTAL) BY MOUTH ONCE DAILY 03/10/15  Yes Unk Pinto, MD  nitroGLYCERIN (NITROSTAT) 0.4 MG SL tablet Place 0.4 mg under the tongue every 5 (five) minutes as needed for chest pain.   Yes Historical Provider, MD  Specialty Vitamins Products (PROSTATE) TABS Take 2 tablets by mouth daily.    Yes Historical Provider, MD  ondansetron (ZOFRAN) 4 MG tablet Take 1 tablet (4 mg total) by mouth every 6 (six) hours. 05/27/15   Stevi Barrett, PA-C  oxyCODONE-acetaminophen (PERCOCET/ROXICET)  5-325 MG tablet Take 1-2 tablets by mouth every 6 (six) hours as needed for severe pain. 05/27/15   Stevi Barrett, PA-C   BP 148/65 mmHg  Pulse 56  Temp(Src) 97.7 F (36.5 C) (Oral)  Resp 18  Ht 5' 6.5" (1.689 m)  Wt 165 lb (74.844 kg)  BMI 26.24 kg/m2  SpO2 95% Physical Exam  Constitutional: He appears well-developed and well-nourished. No distress.  HENT:  Head: Normocephalic and atraumatic.  Eyes: Conjunctivae are normal. Right eye exhibits no discharge. Left eye exhibits no discharge. No scleral icterus.  Neck: Normal range of motion.  Cardiovascular: Normal rate, regular rhythm and normal heart sounds.   Pulmonary/Chest: Effort normal and breath sounds normal. No respiratory distress. He has no wheezes. He has no rales.  Abdominal: Soft. He exhibits no distension. There is no tenderness. There is no rebound and no guarding.  Abdomen is soft, nontender. No rebound, guarding or rigidity. Patient received fentanyl before initial interview and reports complete resolution of his symptoms. He points to his left flank as the source of the pain. No current CVA tenderness  Genitourinary: Testes normal and penis normal. Cremasteric reflex is present. Right testis shows no swelling and no tenderness. Right testis is descended. Left testis shows no swelling and no tenderness. Left testis is descended.  No testicular retraction and normal lie. Testicles are not tender to palpation. No scrotal swelling or erythema  Musculoskeletal: Normal range of motion.  Moves all extremities spontaneously  Neurological: He is alert. Coordination normal.  Skin: Skin is warm and dry. No rash noted.  No rashes or skin changes over left flank  Psychiatric: He has a normal mood and affect. His behavior is normal.  Nursing note and vitals reviewed.   ED Course  Procedures (including critical care time) Labs Review Labs Reviewed  URINALYSIS, ROUTINE W REFLEX MICROSCOPIC (NOT AT [facility]) - Abnormal; Notable for the  following:    APPearance CLOUDY (*)    Hgb urine dipstick LARGE (*)    Ketones, ur 15 (*)    Protein, ur 30 (*)    All other components within normal limits  BASIC METABOLIC PANEL - Abnormal; Notable for the following:    Glucose, Bld 103 (*)    All other components within normal limits  URINE MICROSCOPIC-ADD ON    Imaging Review Ct Renal Stone Study  05/27/2015  CLINICAL DATA:  Acute onset of left flank pain 3 hours prior. Pain and frequency of urination. EXAM: CT ABDOMEN AND PELVIS WITHOUT CONTRAST TECHNIQUE: Multidetector CT imaging of the abdomen and pelvis was performed following the standard protocol without IV contrast. COMPARISON:  CT pelvis 05/22/2013 FINDINGS: Lower chest: Linear subsegmental atelectasis in the right lower lobe. Minimal pleural thickening at the left lung base without effusion. Liver: There is a  10 mm hypodensity in segment 7 of the liver, too small to accurately characterize. Hepatobiliary: Gallbladder surgically absent with clips in the gallbladder fossa. No disproportionate biliary dilatation. Pancreas: Normal. Spleen: Normal. Adrenal glands: No nodule. Mild thickening on the left. Obstructing 6 mm stone at the left ureterovesicular junction with moderate resultant hydroureteronephrosis and mild perinephric stranding. Three additional punctate nonobstructing stones in the left kidney. No right nephrolithiasis or obstructive uropathy. There is a cystic lesion arising from the posterior interpolar right kidney with dominant fluid density component measuring 3.5 cm, however are probable peripheral septal questionable calcification, and an exophytic 1.6 cm portion which is slightly higher in density. Kidneys: Symmetric renal enhancement.  No hydronephrosis. Stomach/Bowel: Stomach physiologically distended. There is a duodenum diverticulum. There are no dilated or thickened small bowel loops. Mild distal colonic diverticulosis without diverticulitis. Moderate volume of stool  throughout the colon without colonic wall thickening. The appendix is normal. Vascular/Lymphatic: No retroperitoneal adenopathy. Abdominal aorta is normal in caliber. Mild atherosclerosis without aneurysm. Reproductive: Prostate gland is normal in size. There is a well-defined 3.4 cm ovoid soft tissue density in the left inguinal canal. This was not seen on prior exam. Fat within both inguinal canals. Bladder: Minimally distended but with diffuse bladder wall thickening. Other: No free air, free fluid, or intra-abdominal fluid collection. Musculoskeletal: There are no acute or suspicious osseous abnormalities. Degenerative disc disease and facet arthropathy throughout the lumbar spine. Degenerative change in both hips, chondrocalcinosis of the pubic symphysis. IMPRESSION: 1. Obstructing 6 mm stone at the left ureterovesicular junction with resultant moderate hydroureteronephrosis. Additional nonobstructing stones in the left kidney. 2. Cystic lesion in the right kidney measures at least 3.5 cm with exophytic component and probable thin calcified septa. Recommend nonemergent renal protocol MRI or CT with and without contrast for characterization. 3. Small hypodense liver lesion, too small to characterize, however could be characterized with above CT or MRI. 4. Well-defined soft tissue density in the left inguinal canal, suspect retraction of the left testicle, however recommend confirmation with physical exam. 5. Nonspecific wall thickening of the urinary bladder. 6. Incidental findings include colonic diverticulosis without diverticulitis, duodenum diverticulum, and atherosclerosis. Electronically Signed   By: Jeb Levering M.D.   On: 05/27/2015 21:51   I have personally reviewed and evaluated these images and lab results as part of my medical decision-making.   EKG Interpretation None      MDM   Final diagnoses:  Flank pain  Nephrolithiasis   Patient presenting with acute onset of severe left  flank pain with radiation into the left groin. Patient has history of kidney stones and reports that this feels similar. Pain well controlled in the emergency department with fentanyl and morphine. Denies associated symptoms. Pt is nontoxic and appears comfortable. Abdomen is soft, non-tender without peritoneal signs. No CVA tenderness. Testicles are descended and non-tender. Creatinine 1.05. UA with large Hgb. CT renal shows 6 mm obstructing stone at ureterovesicular junction with hydronephrosis. CT also notes cystic lesion in the right kidney and recommends nonemergent renal MRI or CT follow-up. CT questioned possible retraction of left testicle which is not confirmed by exam. Discussed imaging findings and plan with pt. He has passed his last kidney stones successfully and will be discharged home with pain and nausea medicine. Instructed to strain all urine until stone passes. Discussed that he will need to schedule a follow-up appointment with his PCP for the renal MRI or CT follow-up scan. Patient has been seen and evaluated by Dr. Apolonio Schneiders  Little who agrees with this plan. Return precautions discussed with patient and written and discharge paperwork. Patient is stable for discharge.    Josephina Gip, PA-C 05/28/15 [place], MD 05/30/15 [phone]

## 2015-05-30 DIAGNOSIS — N200 Calculus of kidney: Secondary | ICD-10-CM | POA: Diagnosis not present

## 2015-05-30 DIAGNOSIS — N281 Cyst of kidney, acquired: Secondary | ICD-10-CM | POA: Diagnosis not present

## 2015-06-03 ENCOUNTER — Ambulatory Visit: Payer: Self-pay | Admitting: Internal Medicine

## 2015-06-03 ENCOUNTER — Ambulatory Visit: Payer: Self-pay | Admitting: Physician Assistant

## 2015-06-05 ENCOUNTER — Encounter: Payer: Self-pay | Admitting: Cardiovascular Disease

## 2015-06-05 ENCOUNTER — Ambulatory Visit (INDEPENDENT_AMBULATORY_CARE_PROVIDER_SITE_OTHER): Payer: Medicare Other | Admitting: Cardiovascular Disease

## 2015-06-05 VITALS — BP 130/66 | HR 60 | Ht 66.5 in | Wt 169.3 lb

## 2015-06-05 DIAGNOSIS — G459 Transient cerebral ischemic attack, unspecified: Secondary | ICD-10-CM | POA: Diagnosis not present

## 2015-06-05 DIAGNOSIS — I255 Ischemic cardiomyopathy: Secondary | ICD-10-CM

## 2015-06-05 DIAGNOSIS — I25118 Atherosclerotic heart disease of native coronary artery with other forms of angina pectoris: Secondary | ICD-10-CM

## 2015-06-05 DIAGNOSIS — I472 Ventricular tachycardia: Secondary | ICD-10-CM | POA: Diagnosis not present

## 2015-06-05 DIAGNOSIS — I10 Essential (primary) hypertension: Secondary | ICD-10-CM | POA: Diagnosis not present

## 2015-06-05 DIAGNOSIS — I4729 Other ventricular tachycardia: Secondary | ICD-10-CM

## 2015-06-05 DIAGNOSIS — I447 Left bundle-branch block, unspecified: Secondary | ICD-10-CM

## 2015-06-05 NOTE — Patient Instructions (Signed)
Your physician has requested that you have a carotid duplex. This test is an ultrasound of the carotid arteries in your neck. It looks at blood flow through these arteries that supply the brain with blood. Allow one hour for this exam. There are no restrictions or special instructions.  Dr. Sallyanne Kuster recommends that you schedule a follow-up appointment in: 6 months

## 2015-06-05 NOTE — Progress Notes (Signed)
Patient ID: Paul Frazier, male   DOB: 23-Oct-1932, 81 y.o.   MRN: [id number]      Cardiology Office Note   Date:  06/05/2015   ID:  Paul Frazier, DOB 05-25-1933, MRN [id number]  PCP:  Alesia Richards, MD  Cardiologist:   Sanda Klein, MD   Chief Complaint  Patient presents with  . Follow-up    Event Monitor results.  No complaints of chest pain, SOB, edema or dizziness      History of Present Illness: Paul Frazier is a 81 y.o. male who presents for  Follow-up after an episode of sluggishness and disorientation. His wife reports that he appeared to respond very slowly to questions and was confused. There was no evidence of a clear neurological focal deficit. CT of the head showed some chronic changes, but no acute stroke and no evidence of recurrence of previous subdural hematoma. Event monitoring showed no significant arrhythmia , although he has previously had a history of nonsustained ventricular tachycardia and has a chronic left bundle branch block which could put him at risk for development of AV block. Complaints were not associated with other cardiovascular symptoms such as dyspnea , syncope, angina or palpitations.   He has not had any recurrence of those complaints. He remains active and does not have any cardiovascular symptoms. Last winter he had CCS class II exertional angina especially when the weather was cold, but over the summer this has not been a problem.  He has a complicated history of coronary disease and underwent bypass surgery in 1986. He has total occlusion of his LAD artery and his dominant left circumflex coronary artery is graft dependent. He presented with a moderate size non-ST segment elevation myocardial infarction in the setting of paroxysmal supraventricular tachycardia in October 2012 and was treated in [place], [place]. The catheterization report is a little confusing since it describes grafts with different locations than those described at  the time of his bypass procedure, however it appears that he has lost the sequential bypass to the oblique marginal artery. A more recent nuclear stress test (May 2014) showed that his left ventricular ejection fraction was down to 42% with inferolateral and anterolateral scar and hypokinesia but without any reversible ischemic abnormalities.  I was concerned that he had possibly developed disease in the sequential saphenous vein graft to the PDA/PLA based on that report and recommended coronary angiography but he wanted to proceed with conservative management. He has not had any serious event since that time. As more and more time passes since that last evaluation the urgency of coronary angiography/revascularization becomes less and less.  Past Medical History  Diagnosis Date  . Arthritis   . Hypertension   . CAD (coronary artery disease)   . Old myocardial infarct   . S/P CABG x 5 08/29/1984    LIMA to LAD,SVG to diagonal,SVG to OM,SVG to PDA and PLA  . Tremor, essential   . Hyperlipidemia     Past Surgical History  Procedure Laterality Date  . Coronary artery bypass graft  1986    x5 LIMA to LAD,SVG to diagonal,SVG to OM,SVG to PDA and PLA  . Coronary stent placement    . Cholecystectomy  10/1990  . Inguinal hernia repair  04/25/2012    right- laparoscopic  . US echocardiography  12/27/2008    concentric LVH,mild to mod MR, TR and pulmonary hypertension. EF 45-50%  . R/p myoview  12/27/2008    no ischemia     Current Outpatient Prescriptions  Medication Sig Dispense Refill  . aspirin 81 MG tablet Take 81 mg by mouth daily.    [name] buPROPion (WELLBUTRIN XL) 300 MG 24 hr tablet TAKE ONE TABLET BY MOUTH ONCE DAILY FOR  MOOD 90 tablet 2  . co-enzyme Q-10 30 MG capsule Take 60 mg by mouth daily.    . diazepam (VALIUM) 5 MG tablet TAKE 1/2-1 TAB THREE TIMES A DAY AS NEEDED FOR MUSCLE SPASM 90 tablet 2  . Digestive Enzymes CAPS Take 1-2 capsules by mouth as needed (digestive function).       . Magnesium Chloride (MAGNESIUM DR PO) Take 1 tablet by mouth daily.     . metoprolol succinate (TOPROL-XL) 25 MG 24 hr tablet TAKE ONE TABLET (25 MG TOTAL) BY MOUTH ONCE DAILY 90 tablet 0  . nitroGLYCERIN (NITROSTAT) 0.4 MG SL tablet Place 0.4 mg under the tongue every 5 (five) minutes as needed for chest pain.    [name] Specialty Vitamins Products (PROSTATE) TABS Take 2 tablets by mouth daily.      No current facility-administered medications for this visit.    Allergies:   Erythromycin    Social History:  The patient  reports that he quit smoking about 44 years ago. He has never used smokeless tobacco. He reports that he does not drink alcohol or use illicit drugs.   Family History:  The patient's family history includes Cancer in his brother and father.    ROS:  Please see the history of present illness.    Otherwise, review of systems positive for none.   All other systems are reviewed and negative.    PHYSICAL EXAM: VS:  BP 130/66 mmHg  Pulse 60  Ht 5' 6.5" (1.689 m)  Wt 169 lb 4.8 oz (76.794 kg)  BMI 26.92 kg/m2 , BMI Body mass index is 26.92 kg/(m^2).  General: Alert, oriented x3, no distress Head: no evidence of trauma, PERRL, EOMI, no exophtalmos or lid lag, no myxedema, no xanthelasma; normal ears, nose and oropharynx Neck: normal jugular venous pulsations and no hepatojugular reflux; brisk carotid pulses without delay and no carotid bruits Chest: clear to auscultation, no signs of consolidation by percussion or palpation, normal fremitus, symmetrical and full respiratory excursions Cardiovascular: normal position and quality of the apical impulse, regular rhythm, normal first and paradoxically split second heart sounds, no murmurs, rubs or gallops Abdomen: no tenderness or distention, no masses by palpation, no abnormal pulsatility or arterial bruits, normal bowel sounds, no hepatosplenomegaly Extremities: no clubbing, cyanosis or edema; 2+ radial, ulnar and brachial pulses  bilaterally; 2+ right femoral, posterior tibial and dorsalis pedis pulses; 2+ left femoral, posterior tibial and dorsalis pedis pulses; no subclavian or femoral bruits Neurological: grossly nonfocal Psych: euthymic mood, full affect   EKG:  EKG is not ordered today  Recent Labs: 05/01/2015: ALT 24; Hemoglobin 16.1; Magnesium 1.9; Platelets 197; TSH 0.694 05/27/2015: BUN 17; Creatinine, Ser 1.05; Potassium 4.0; Sodium 139    Lipid Panel    Component Value Date/Time   CHOL 175 05/01/2015 1156   TRIG 90 05/01/2015 1156   HDL 46 05/01/2015 1156   CHOLHDL 3.8 05/01/2015 1156   VLDL 18 05/01/2015 1156   LDLCALC 111 05/01/2015 1156      Wt Readings from Last 3 Encounters:  06/05/15 169 lb 4.8 oz (76.794 kg)  05/27/15 165 lb (74.844 kg)  05/01/15 168 lb 3.2 oz (76.295 kg)      ASSESSMENT AND PLAN:  Unclear what causes recent episode of sluggishness and disorientation.  Could have had a transient ischemic attack, but could also have been secondary to transient hypotension. No arrhythmia was seen on event monitoring. Will recommend carotid duplex ultrasonography, but at this point further workup does not appear to be justified. If symptoms should recur, more sustained arrhythmia monitor with an implantable loop recorder is probably reasonable.  1. CAD status post CABG with exertional angina pectoris. His pattern of angina has been quite stable for several years now. Would not increase the beta blocker since he has bradycardia and intraventricular conduction disease.  2. Ischemic cardiomyopathy, without overt heart failure  3. Hyperlipidemia, target LDL less than 100, preferably less than 70. Usually takes only half of the prescribed dose of statin since he is worried about side effects.  4. History of nonsustained ventricular tachycardia, incidentally identified on event monitor. No symptoms of ventricular tachycardia. Continue beta blocker as long as he does not have serious  bradycardia.  5. LBBB without evidence of high grade AV node block   Current medicines are reviewed at length with the patient today.  The patient does not have concerns regarding medicines.  The following changes have been made:  no change  Labs/ tests ordered today include:  No orders of the defined types were placed in this encounter.    Patient Instructions  Your physician has requested that you have a carotid duplex. This test is an ultrasound of the carotid arteries in your neck. It looks at blood flow through these arteries that supply the brain with blood. Allow one hour for this exam. There are no restrictions or special instructions.  Dr. Sallyanne Kuster recommends that you schedule a follow-up appointment in: 6 months    Signed, Sanda Klein, MD  06/05/2015 9:29 PM    Sanda Klein, MD, [facility] [phone] office [phone] pager

## 2015-06-09 ENCOUNTER — Ambulatory Visit (HOSPITAL_COMMUNITY)
Admission: RE | Admit: 2015-06-09 | Discharge: 2015-06-09 | Disposition: A | Discharge status: Home / Self Care | Payer: Medicare Other | Source: Ambulatory Visit | Attending: Cardiology | Admitting: Cardiology

## 2015-06-09 DIAGNOSIS — I6523 Occlusion and stenosis of bilateral carotid arteries: Secondary | ICD-10-CM | POA: Diagnosis not present

## 2015-06-09 DIAGNOSIS — I6501 Occlusion and stenosis of right vertebral artery: Secondary | ICD-10-CM | POA: Insufficient documentation

## 2015-06-09 DIAGNOSIS — G459 Transient cerebral ischemic attack, unspecified: Secondary | ICD-10-CM

## 2015-06-09 DIAGNOSIS — R2681 Unsteadiness on feet: Secondary | ICD-10-CM | POA: Diagnosis not present

## 2015-06-17 DIAGNOSIS — N1330 Unspecified hydronephrosis: Secondary | ICD-10-CM | POA: Diagnosis not present

## 2015-06-17 DIAGNOSIS — N201 Calculus of ureter: Secondary | ICD-10-CM | POA: Diagnosis not present

## 2015-06-17 DIAGNOSIS — N202 Calculus of kidney with calculus of ureter: Secondary | ICD-10-CM | POA: Diagnosis not present

## 2015-06-17 DIAGNOSIS — N281 Cyst of kidney, acquired: Secondary | ICD-10-CM | POA: Diagnosis not present

## 2015-06-17 DIAGNOSIS — N200 Calculus of kidney: Secondary | ICD-10-CM | POA: Diagnosis not present

## 2015-06-20 DIAGNOSIS — C44222 Squamous cell carcinoma of skin of right ear and external auricular canal: Secondary | ICD-10-CM | POA: Diagnosis not present

## 2015-06-20 DIAGNOSIS — L821 Other seborrheic keratosis: Secondary | ICD-10-CM | POA: Diagnosis not present

## 2015-06-20 DIAGNOSIS — D225 Melanocytic nevi of trunk: Secondary | ICD-10-CM | POA: Diagnosis not present

## 2015-06-20 DIAGNOSIS — L570 Actinic keratosis: Secondary | ICD-10-CM | POA: Diagnosis not present

## 2015-06-20 DIAGNOSIS — D485 Neoplasm of uncertain behavior of skin: Secondary | ICD-10-CM | POA: Diagnosis not present

## 2015-06-20 DIAGNOSIS — D1801 Hemangioma of skin and subcutaneous tissue: Secondary | ICD-10-CM | POA: Diagnosis not present

## 2015-07-02 DIAGNOSIS — N200 Calculus of kidney: Secondary | ICD-10-CM | POA: Diagnosis not present

## 2015-07-02 DIAGNOSIS — N201 Calculus of ureter: Secondary | ICD-10-CM | POA: Diagnosis not present

## 2015-07-02 DIAGNOSIS — N281 Cyst of kidney, acquired: Secondary | ICD-10-CM | POA: Diagnosis not present

## 2015-07-08 DIAGNOSIS — C44222 Squamous cell carcinoma of skin of right ear and external auricular canal: Secondary | ICD-10-CM | POA: Diagnosis not present

## 2015-07-09 DIAGNOSIS — N201 Calculus of ureter: Secondary | ICD-10-CM | POA: Diagnosis not present

## 2015-07-09 DIAGNOSIS — N281 Cyst of kidney, acquired: Secondary | ICD-10-CM | POA: Diagnosis not present

## 2015-07-18 ENCOUNTER — Encounter: Payer: Self-pay | Admitting: *Deleted

## 2015-08-01 ENCOUNTER — Encounter: Payer: Self-pay | Admitting: Physician Assistant

## 2015-08-01 ENCOUNTER — Ambulatory Visit (HOSPITAL_COMMUNITY)
Admission: RE | Admit: 2015-08-01 | Discharge: 2015-08-01 | Disposition: A | Discharge status: Home / Self Care | Payer: Medicare Other | Source: Ambulatory Visit | Attending: Physician Assistant | Admitting: Physician Assistant

## 2015-08-01 ENCOUNTER — Ambulatory Visit (INDEPENDENT_AMBULATORY_CARE_PROVIDER_SITE_OTHER): Payer: Medicare Other | Admitting: Physician Assistant

## 2015-08-01 VITALS — BP 130/80 | HR 69 | Temp 97.2°F | Resp 14 | Ht 66.5 in | Wt 173.0 lb

## 2015-08-01 DIAGNOSIS — Z85828 Personal history of other malignant neoplasm of skin: Secondary | ICD-10-CM | POA: Insufficient documentation

## 2015-08-01 DIAGNOSIS — E782 Mixed hyperlipidemia: Secondary | ICD-10-CM

## 2015-08-01 DIAGNOSIS — C44219 Basal cell carcinoma of skin of left ear and external auricular canal: Secondary | ICD-10-CM

## 2015-08-01 DIAGNOSIS — I447 Left bundle-branch block, unspecified: Secondary | ICD-10-CM

## 2015-08-01 DIAGNOSIS — C44211 Basal cell carcinoma of skin of unspecified ear and external auricular canal: Secondary | ICD-10-CM | POA: Insufficient documentation

## 2015-08-01 DIAGNOSIS — E559 Vitamin D deficiency, unspecified: Secondary | ICD-10-CM

## 2015-08-01 DIAGNOSIS — M25511 Pain in right shoulder: Secondary | ICD-10-CM

## 2015-08-01 DIAGNOSIS — Z79899 Other long term (current) drug therapy: Secondary | ICD-10-CM | POA: Diagnosis not present

## 2015-08-01 DIAGNOSIS — I10 Essential (primary) hypertension: Secondary | ICD-10-CM | POA: Diagnosis not present

## 2015-08-01 DIAGNOSIS — R7309 Other abnormal glucose: Secondary | ICD-10-CM

## 2015-08-01 DIAGNOSIS — S4991XA Unspecified injury of right shoulder and upper arm, initial encounter: Secondary | ICD-10-CM | POA: Diagnosis not present

## 2015-08-01 DIAGNOSIS — M11211 Other chondrocalcinosis, right shoulder: Secondary | ICD-10-CM | POA: Diagnosis not present

## 2015-08-01 DIAGNOSIS — I25118 Atherosclerotic heart disease of native coronary artery with other forms of angina pectoris: Secondary | ICD-10-CM | POA: Diagnosis not present

## 2015-08-01 LAB — LIPID PANEL
Cholesterol: 169 mg/dL (ref 125–200)
HDL: 42 mg/dL (ref >=40)
LDL Cholesterol: 92 mg/dL (ref <130)
Total CHOL/HDL Ratio: 4 Ratio (ref <=5.0)
Triglycerides: 176 mg/dL — ABNORMAL HIGH (ref <150)
VLDL: 35 mg/dL — ABNORMAL HIGH (ref <30)

## 2015-08-01 LAB — BASIC METABOLIC PANEL WITH GFR
BUN: 21 mg/dL (ref 7–25)
CO2: 26 mmol/L (ref 20–31)
Calcium: 8.9 mg/dL (ref 8.6–10.3)
Chloride: 103 mmol/L (ref 98–110)
Creat: 0.91 mg/dL (ref 0.70–1.11)
GFR, Est African American: >89 mL/min (ref >=60)
GFR, Est Non African American: 79 mL/min (ref >=60)
Glucose, Bld: 98 mg/dL (ref 65–99)
Potassium: 4.3 mmol/L (ref 3.5–5.3)
Sodium: 139 mmol/L (ref 135–146)

## 2015-08-01 LAB — HEPATIC FUNCTION PANEL
ALT: 26 U/L (ref 9–46)
AST: 24 U/L (ref 10–35)
Albumin: 3.9 g/dL (ref 3.6–5.1)
Alkaline Phosphatase: 56 U/L (ref 40–115)
Bilirubin, Direct: 0.1 mg/dL (ref <=0.2)
Indirect Bilirubin: 0.5 mg/dL (ref 0.2–1.2)
Total Bilirubin: 0.6 mg/dL (ref 0.2–1.2)
Total Protein: 6.3 g/dL (ref 6.1–8.1)

## 2015-08-01 LAB — CBC WITH DIFFERENTIAL/PLATELET
Basophils Absolute: 0 10*3/uL (ref 0.0–0.1)
Basophils Relative: 0 % (ref 0–1)
Eosinophils Absolute: 0.2 10*3/uL (ref 0.0–0.7)
Eosinophils Relative: 3 % (ref 0–5)
HCT: 46.2 % (ref 39.0–52.0)
Hemoglobin: 16 g/dL (ref 13.0–17.0)
Lymphocytes Relative: 36 % (ref 12–46)
Lymphs Abs: 2.6 10*3/uL (ref 0.7–4.0)
MCH: 31.9 pg (ref 26.0–34.0)
MCHC: 34.6 g/dL (ref 30.0–36.0)
MCV: 92.2 fL (ref 78.0–100.0)
MPV: 10.1 fL (ref 8.6–12.4)
Monocytes Absolute: 0.7 10*3/uL (ref 0.1–1.0)
Monocytes Relative: 9 % (ref 3–12)
Neutro Abs: 3.8 10*3/uL (ref 1.7–7.7)
Neutrophils Relative %: 52 % (ref 43–77)
Platelets: 208 10*3/uL (ref 150–400)
RBC: 5.01 MIL/uL (ref 4.22–5.81)
RDW: 13.9 % (ref 11.5–15.5)
WBC: 7.3 10*3/uL (ref 4.0–10.5)

## 2015-08-01 LAB — MAGNESIUM: Magnesium: 1.9 mg/dL (ref 1.5–2.5)

## 2015-08-01 LAB — HEMOGLOBIN A1C
Hgb A1c MFr Bld: 5.9 % — ABNORMAL HIGH (ref <5.7)
Mean Plasma Glucose: 123 mg/dL — ABNORMAL HIGH (ref <117)

## 2015-08-01 LAB — TSH: TSH: 0.297 u[IU]/mL — ABNORMAL LOW (ref 0.350–4.500)

## 2015-08-01 NOTE — Patient Instructions (Signed)

## 2015-08-01 NOTE — Progress Notes (Signed)
Patient ID: Akheem Schmitzer, male   DOB: 04/02/1933, 82 y.o.   MRN: [id number]  Assessment and Plan:  Hypertension:  -Continue medication,  -occasional chest pain with exertion, patient to go to ER or to his cardiologist if this increases or is consistent.  -monitor blood pressure at home.  -Continue DASH diet.   -Reminder to go to the ER if any CP, SOB, nausea, dizziness, severe HA, changes vision/speech, left arm numbness and tingling, and jaw pain.  Cholesterol: -Continue diet and exercise.  -Check cholesterol.   Pre-diabetes: -Continue diet and exercise.  -Check A1C  Vitamin D Def: -check level -continue medications.   Depression/anxiety Continue wellbutrin and valium PRN  CAD Control blood pressure, cholesterol, glucose, increase exercise.   Right shoulder pain only at night FROM Cervical neck pillow/support, get Xray concerning with night pain.   Continue diet and meds as discussed. Further disposition pending results of labs.  HPI 82 y.o. male  presents for 3 month follow up with hypertension, hyperlipidemia, prediabetes and vitamin D.   His blood pressure has been controlled at home, today their BP is BP: 130/80 mmHg.   He does not workout.  He reports that he and his wife have signed up for the [facility].  He has not used it yet.  He denies chest pain, shortness of breath, dizziness. 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   He is on cholesterol medication and denies myalgias. His cholesterol is not at goal. The cholesterol last visit was:   Lab Results  Component Value Date   CHOL 175 05/01/2015   HDL 46 05/01/2015   LDLCALC 111 05/01/2015   TRIG 90 05/01/2015   CHOLHDL 3.8 05/01/2015     He has not been working on diet and exercise for prediabetes, and denies foot ulcerations, hyperglycemia, hypoglycemia , increased appetite, nausea, paresthesia of the feet, polydipsia, polyuria, visual disturbances, vomiting and weight loss. Last A1C in the office  was:  Lab Results  Component Value Date   HGBA1C 5.8* 05/01/2015    Patient is on Vitamin D supplement.  Lab Results  Component Value Date   VD25OH 41 05/01/2015     Had kidney stone in Nov, follows with [facility].   Current Medications:  Current Outpatient Prescriptions on File Prior to Visit  Medication Sig Dispense Refill  . aspirin 81 MG tablet Take 81 mg by mouth daily.    [name] buPROPion (WELLBUTRIN XL) 300 MG 24 hr tablet TAKE ONE TABLET BY MOUTH ONCE DAILY FOR  MOOD 90 tablet 2  . co-enzyme Q-10 30 MG capsule Take 60 mg by mouth daily.    . diazepam (VALIUM) 5 MG tablet TAKE 1/2-1 TAB THREE TIMES A DAY AS NEEDED FOR MUSCLE SPASM 90 tablet 2  . Digestive Enzymes CAPS Take 1-2 capsules by mouth as needed (digestive function).     . Magnesium Chloride (MAGNESIUM DR PO) Take 1 tablet by mouth daily.     . metoprolol succinate (TOPROL-XL) 25 MG 24 hr tablet TAKE ONE TABLET (25 MG TOTAL) BY MOUTH ONCE DAILY 90 tablet 0  . nitroGLYCERIN (NITROSTAT) 0.4 MG SL tablet Place 0.4 mg under the tongue every 5 (five) minutes as needed for chest pain.    [name] Specialty Vitamins Products (PROSTATE) TABS Take 2 tablets by mouth daily.      No current facility-administered medications on file prior to visit.    Medical History:  Past Medical History  Diagnosis Date  . Arthritis   .  Hypertension   . CAD (coronary artery disease)   . Old myocardial infarct   . S/P CABG x 5 08/29/1984    LIMA to LAD,SVG to diagonal,SVG to OM,SVG to PDA and PLA  . Tremor, essential   . Hyperlipidemia     Allergies:  Allergies  Allergen Reactions  . Erythromycin Nausea Only     Review of Systems:  Review of Systems  Constitutional: Negative for fever, chills and malaise/fatigue.  HENT: Negative for congestion, ear discharge, ear pain and sore throat.   Eyes: Negative.   Respiratory: Negative for cough, shortness of breath and wheezing.   Cardiovascular: Negative for chest pain, palpitations and  leg swelling.  Gastrointestinal: Negative for heartburn, nausea, vomiting, abdominal pain, diarrhea, constipation, blood in stool and melena.  Genitourinary: Negative.   Musculoskeletal: Positive for myalgias (at night in feet) and joint pain (right shoulder only at night). Negative for back pain, falls and neck pain.  Skin: Negative.   Neurological: Negative for dizziness, sensory change, loss of consciousness and headaches.  Psychiatric/Behavioral: Negative for depression. The patient is not nervous/anxious and does not have insomnia.     Family history- Review and unchanged  Social history- Review and unchanged  Physical Exam: BP 130/80 mmHg  Pulse 69  Temp(Src) 97.2 F (36.2 C) (Temporal)  Resp 14  Ht 5' 6.5" (1.689 m)  Wt 173 lb (78.472 kg)  BMI 27.51 kg/m2  SpO2 99% Wt Readings from Last 3 Encounters:  08/01/15 173 lb (78.472 kg)  06/05/15 169 lb 4.8 oz (76.794 kg)  05/27/15 165 lb (74.844 kg)    General Appearance: Well nourished well developed, in no apparent distress. Eyes: PERRLA, EOMs, conjunctiva no swelling or erythema ENT/Mouth: Ear canals normal without obstruction, swelling, erythma, discharge.  TMs normal bilaterally.  Oropharynx moist, clear, without exudate, or postoropharyngeal swelling. Neck: Supple, thyroid normal,no cervical adenopathy  Respiratory: Respiratory effort normal, Breath sounds clear A&P without rhonchi, wheeze, or rale.  No retractions, no accessory usage. Cardio: RRR with no MRGs. Brisk peripheral pulses without edema.  Abdomen: Soft, + BS,  Non tender, no guarding, rebound, hernias, masses. Musculoskeletal: Full ROM, 5/5 strength, Normal gait. Right shoulder with FROM, nontender, neck nontender with FROM.  Skin: Warm, dry without rashes, lesions, ecchymosis.  Neuro: Awake and oriented X 3, Cranial nerves intact. Normal muscle tone, no cerebellar symptoms. Psych: depressed affect, Insight and Judgment appropriate.    Vicie Mutters,  PA-C 10:51 AM [facility]

## 2015-09-02 ENCOUNTER — Ambulatory Visit: Payer: Self-pay

## 2015-09-04 ENCOUNTER — Encounter: Payer: Self-pay | Admitting: Internal Medicine

## 2015-09-04 ENCOUNTER — Ambulatory Visit (INDEPENDENT_AMBULATORY_CARE_PROVIDER_SITE_OTHER): Payer: Medicare Other | Admitting: Internal Medicine

## 2015-09-04 VITALS — BP 114/66 | HR 60 | Temp 97.5°F | Resp 16 | Ht 66.5 in | Wt 171.0 lb

## 2015-09-04 DIAGNOSIS — H8110 Benign paroxysmal vertigo, unspecified ear: Secondary | ICD-10-CM | POA: Diagnosis not present

## 2015-09-04 DIAGNOSIS — I10 Essential (primary) hypertension: Secondary | ICD-10-CM

## 2015-09-04 DIAGNOSIS — B351 Tinea unguium: Secondary | ICD-10-CM

## 2015-09-04 DIAGNOSIS — E0590 Thyrotoxicosis, unspecified without thyrotoxic crisis or storm: Secondary | ICD-10-CM | POA: Diagnosis not present

## 2015-09-04 MED ORDER — TERBINAFINE HCL 250 MG PO TABS: 250.0000 mg | ORAL_TABLET | Freq: Every day | ORAL | Status: DC

## 2015-09-04 MED ORDER — MECLIZINE HCL 25 MG PO TABS: ORAL_TABLET | ORAL | Status: AC

## 2015-09-04 NOTE — Patient Instructions (Signed)
Hyperthyroidism  Hyperthyroidism is when the thyroid is too active (overactive). Your thyroid is a large gland that is located in your neck. The thyroid helps to control how your body uses food (metabolism). When your thyroid is overactive, it produces too much of a hormone called thyroxine.  CAUSES Causes of hyperthyroidism may include:  Graves disease. This is when your immune system attacks the thyroid gland. This is the most common cause.  Inflammation of the thyroid gland.  Tumor in the thyroid gland or somewhere else.  Excessive use of thyroid medicines, including:  Prescription thyroid supplement.  Herbal supplements that mimic thyroid hormones.  Solid or fluid-filled lumps within your thyroid gland (thyroid nodules).  Excessive ingestion of iodine. RISK FACTORS  Being male.  Having a family history of thyroid conditions. SIGNS AND SYMPTOMS Signs and symptoms of hyperthyroidism may include:  Nervousness.  Inability to tolerate heat.  Unexplained weight loss.  Diarrhea.  Change in the texture of hair or skin.  Heart skipping beats or making extra beats.  Rapid heart rate.  Loss of menstruation.  Shaky hands.  Fatigue.  Restlessness.  Increased appetite.  Sleep problems.  Enlarged thyroid gland or nodules. DIAGNOSIS  Diagnosis of hyperthyroidism may include:  Medical history and physical exam.  Blood tests.  Ultrasound tests. TREATMENT Treatment may include:  Medicines to control your thyroid.  Surgery to remove your thyroid.  Radiation therapy. HOME CARE INSTRUCTIONS   Take medicines only as directed by your health care provider.  Do not use any tobacco products, including cigarettes, chewing tobacco, or electronic cigarettes. If you need help quitting, ask your health care provider.  Do not exercise or do physical activity until your health care provider approves.  Keep all follow-up appointments as directed by your health care  provider. This is important. SEEK MEDICAL CARE IF:  Your symptoms do not get better with treatment.  You have fever.  You are taking thyroid replacement medicine and you:  Have depression.  Feel mentally and physically slow.  Have weight gain. SEEK IMMEDIATE MEDICAL CARE IF:   You have decreased alertness or a change in your awareness.  You have abdominal pain.  You feel dizzy.  You have a rapid heartbeat.  You have an irregular heartbeat.

## 2015-09-05 ENCOUNTER — Other Ambulatory Visit: Payer: Self-pay | Admitting: Internal Medicine

## 2015-09-05 ENCOUNTER — Encounter: Payer: Self-pay | Admitting: Internal Medicine

## 2015-09-05 DIAGNOSIS — E0590 Thyrotoxicosis, unspecified without thyrotoxic crisis or storm: Secondary | ICD-10-CM

## 2015-09-05 LAB — TSH: TSH: 0.39 mIU/L — ABNORMAL LOW (ref 0.40–4.50)

## 2015-09-05 NOTE — Progress Notes (Signed)
  Subjective:    Patient ID: Paul Frazier, male    DOB: 03/18/1933, 82 y.o.   MRN: [id number]  HPI  Patient returns today to recheck a recently found supressed TSH suspect for occult hyperthyroidism and also c/o Bilat dystrophic 1st toenails and further c/o vertigo with positional head changes. Denies any other neuro type sx's, head injury, N/V or Upper respiratory type sx's.    WRT the low TSH, he has had no sx's of elevated BP, palpitations, tremor, weight loss,insomnia or diarrhea.   Medication Sig  . aspirin 81 MG  Take 81 mg by mouth daily.  [name] buPROPion-XL 300 MG TAKE ONE TABLET BY MOUTH ONCE DAILY FOR  MOOD  . co-enzyme Q-10 30 MG  Take 60 mg by mouth daily.  . diazepam  5 MG tablet TAKE 1/2-1 TAB THREE TIMES A DAY AS NEEDED FOR MUSCLE SPASM  . Digestive Enzymes CAPS Take 1-2 capsules by mouth as needed (digestive function).   . Magnesium  Take 1 tablet by mouth daily.   . metoprolol succinate-XL) 25 MG  TAKE ONE TABLET (25 MG TOTAL) BY MOUTH ONCE DAILY  . NITROSTAT 0.4 MG SL  Place 0.4 mg under the tongue every 5 (five) minutes as needed for chest pain.  [name] Specialty Vitamins Products (PROSTATE) TABS Take 2 tablets by mouth daily.    Allergies  Allergen Reactions  . Erythromycin Nausea Only   Past Medical History  Diagnosis Date  . Arthritis   . Hypertension   . CAD (coronary artery disease)   . Old myocardial infarct   . S/P CABG x 5 08/29/1984    LIMA to LAD,SVG to diagonal,SVG to OM,SVG to PDA and PLA  . Tremor, essential   . Hyperlipidemia    Review of Systems  10 point systems review negative except as above.    Objective:   Physical Exam  BP 114/66 mmHg  Pulse 60  Temp(Src) 97.5 F (36.4 C)  Resp 16  Ht 5' 6.5" (1.689 m)  Wt 171 lb (77.565 kg)  BMI 27.19 kg/m2  In no distress.   HEENT - Eac's patent. TM's Nl. EOM's full. PERRLA. NasoOroPharynx clear. Neck - supple. Nl Thyroid. Carotids 2+ & No bruits, nodes, JVD Chest - Clear equal BS w/o Rales, rhonchi,  wheezes. Cor - Nl HS. RRR w/o sig MGR. PP 1(+). No edema. Abd - No palpable organomegaly, masses or tenderness. BS nl. MS- FROM w/o deformities. Muscle power, tone and bulk Nl. Gait Nl. Neuro - No obvious Cr N abnormalities. Sensory, motor and Cerebellar functions appear Nl w/o focal abnormalities. DTR's nl/= Psyche - Mental status normal & appropriate.  No delusions, ideations or obvious mood abnormalities. Skin - clear. Bilat Dystrophic discolored chalky 1st toenails L>>R    Assessment & Plan:   1. Essential hypertension   2. Hyperthyroidism  - TSH  3. Benign paroxysmal positional vertigo, unspecified laterality  - meclizine (ANTIVERT) 25 MG tablet; Take 1 tablet 3 x day for vertigo  Dispense: 90 tablet; Refill: 1  4. Onychomycosis of toenail  - terbinafine (LAMISIL) 250 MG tablet; Take 1 tablet (250 mg total) by mouth daily. Take 1 tablet daily for toenail fungus  Dispense: 90 tablet; Refill: 0  Over 40 minutes of exam, counseling, chart review and high complex critical decision making was performed

## 2015-09-12 ENCOUNTER — Encounter: Payer: Self-pay | Admitting: Internal Medicine

## 2015-09-15 DIAGNOSIS — L821 Other seborrheic keratosis: Secondary | ICD-10-CM | POA: Diagnosis not present

## 2015-09-15 DIAGNOSIS — L814 Other melanin hyperpigmentation: Secondary | ICD-10-CM | POA: Diagnosis not present

## 2015-09-15 DIAGNOSIS — B351 Tinea unguium: Secondary | ICD-10-CM | POA: Diagnosis not present

## 2015-09-15 DIAGNOSIS — D1801 Hemangioma of skin and subcutaneous tissue: Secondary | ICD-10-CM | POA: Diagnosis not present

## 2015-09-15 DIAGNOSIS — Z85828 Personal history of other malignant neoplasm of skin: Secondary | ICD-10-CM | POA: Diagnosis not present

## 2015-09-15 DIAGNOSIS — H61001 Unspecified perichondritis of right external ear: Secondary | ICD-10-CM | POA: Diagnosis not present

## 2015-09-15 DIAGNOSIS — L570 Actinic keratosis: Secondary | ICD-10-CM | POA: Diagnosis not present

## 2015-09-22 ENCOUNTER — Encounter (HOSPITAL_COMMUNITY)
Admission: RE | Admit: 2015-09-22 | Discharge: 2015-09-22 | Disposition: A | Discharge status: Home / Self Care | Payer: Medicare Other | Source: Ambulatory Visit | Attending: Internal Medicine | Admitting: Internal Medicine

## 2015-09-22 ENCOUNTER — Encounter (HOSPITAL_COMMUNITY): Payer: Self-pay

## 2015-09-22 DIAGNOSIS — E0590 Thyrotoxicosis, unspecified without thyrotoxic crisis or storm: Secondary | ICD-10-CM | POA: Diagnosis not present

## 2015-09-22 MED ORDER — SODIUM IODIDE I 131 CAPSULE
10.0000 | Freq: Once | INTRAVENOUS | Status: AC | PRN
  Administered 2015-09-22: 10 via ORAL

## 2015-09-23 ENCOUNTER — Encounter (HOSPITAL_COMMUNITY)
Admission: RE | Admit: 2015-09-23 | Discharge: 2015-09-23 | Disposition: A | Discharge status: Home / Self Care | Payer: Medicare Other | Source: Ambulatory Visit | Attending: Internal Medicine | Admitting: Internal Medicine

## 2015-09-23 DIAGNOSIS — E0590 Thyrotoxicosis, unspecified without thyrotoxic crisis or storm: Secondary | ICD-10-CM | POA: Diagnosis not present

## 2015-09-23 DIAGNOSIS — F419 Anxiety disorder, unspecified: Secondary | ICD-10-CM | POA: Diagnosis not present

## 2015-09-23 MED ORDER — SODIUM PERTECHNETATE TC 99M INJECTION
10.7000 | Freq: Once | INTRAVENOUS | Status: AC | PRN
  Administered 2015-09-23: 11 via INTRAVENOUS

## 2015-09-29 ENCOUNTER — Encounter: Payer: Self-pay | Admitting: *Deleted

## 2015-10-07 DIAGNOSIS — M25562 Pain in left knee: Secondary | ICD-10-CM | POA: Diagnosis not present

## 2015-10-07 DIAGNOSIS — M25561 Pain in right knee: Secondary | ICD-10-CM | POA: Diagnosis not present

## 2015-10-07 DIAGNOSIS — M170 Bilateral primary osteoarthritis of knee: Secondary | ICD-10-CM | POA: Diagnosis not present

## 2015-10-14 DIAGNOSIS — R2689 Other abnormalities of gait and mobility: Secondary | ICD-10-CM | POA: Diagnosis not present

## 2015-10-14 DIAGNOSIS — M170 Bilateral primary osteoarthritis of knee: Secondary | ICD-10-CM | POA: Diagnosis not present

## 2015-10-14 DIAGNOSIS — M25562 Pain in left knee: Secondary | ICD-10-CM | POA: Diagnosis not present

## 2015-10-14 DIAGNOSIS — M1712 Unilateral primary osteoarthritis, left knee: Secondary | ICD-10-CM | POA: Diagnosis not present

## 2015-10-14 DIAGNOSIS — M25561 Pain in right knee: Secondary | ICD-10-CM | POA: Diagnosis not present

## 2015-10-15 DIAGNOSIS — M1711 Unilateral primary osteoarthritis, right knee: Secondary | ICD-10-CM | POA: Diagnosis not present

## 2015-10-15 DIAGNOSIS — M25561 Pain in right knee: Secondary | ICD-10-CM | POA: Diagnosis not present

## 2015-10-21 DIAGNOSIS — M1712 Unilateral primary osteoarthritis, left knee: Secondary | ICD-10-CM | POA: Diagnosis not present

## 2015-10-21 DIAGNOSIS — M25562 Pain in left knee: Secondary | ICD-10-CM | POA: Diagnosis not present

## 2015-10-22 DIAGNOSIS — M25561 Pain in right knee: Secondary | ICD-10-CM | POA: Diagnosis not present

## 2015-10-22 DIAGNOSIS — M1711 Unilateral primary osteoarthritis, right knee: Secondary | ICD-10-CM | POA: Diagnosis not present

## 2015-10-28 DIAGNOSIS — M25562 Pain in left knee: Secondary | ICD-10-CM | POA: Diagnosis not present

## 2015-10-28 DIAGNOSIS — M1712 Unilateral primary osteoarthritis, left knee: Secondary | ICD-10-CM | POA: Diagnosis not present

## 2015-10-29 DIAGNOSIS — M25561 Pain in right knee: Secondary | ICD-10-CM | POA: Diagnosis not present

## 2015-10-29 DIAGNOSIS — M1711 Unilateral primary osteoarthritis, right knee: Secondary | ICD-10-CM | POA: Diagnosis not present

## 2015-11-05 DIAGNOSIS — M25561 Pain in right knee: Secondary | ICD-10-CM | POA: Diagnosis not present

## 2015-11-05 DIAGNOSIS — M25562 Pain in left knee: Secondary | ICD-10-CM | POA: Diagnosis not present

## 2015-11-05 DIAGNOSIS — M170 Bilateral primary osteoarthritis of knee: Secondary | ICD-10-CM | POA: Diagnosis not present

## 2015-11-07 ENCOUNTER — Ambulatory Visit (INDEPENDENT_AMBULATORY_CARE_PROVIDER_SITE_OTHER): Payer: Medicare Other | Admitting: Internal Medicine

## 2015-11-07 ENCOUNTER — Encounter: Payer: Self-pay | Admitting: Internal Medicine

## 2015-11-07 VITALS — BP 136/62 | HR 56 | Temp 97.5°F | Resp 16 | Ht 66.5 in | Wt 168.2 lb

## 2015-11-07 DIAGNOSIS — E559 Vitamin D deficiency, unspecified: Secondary | ICD-10-CM | POA: Diagnosis not present

## 2015-11-07 DIAGNOSIS — Z1212 Encounter for screening for malignant neoplasm of rectum: Secondary | ICD-10-CM

## 2015-11-07 DIAGNOSIS — I10 Essential (primary) hypertension: Secondary | ICD-10-CM | POA: Diagnosis not present

## 2015-11-07 DIAGNOSIS — Z79899 Other long term (current) drug therapy: Secondary | ICD-10-CM | POA: Diagnosis not present

## 2015-11-07 DIAGNOSIS — Z136 Encounter for screening for cardiovascular disorders: Secondary | ICD-10-CM

## 2015-11-07 DIAGNOSIS — N320 Bladder-neck obstruction: Secondary | ICD-10-CM | POA: Diagnosis not present

## 2015-11-07 DIAGNOSIS — R7309 Other abnormal glucose: Secondary | ICD-10-CM

## 2015-11-07 DIAGNOSIS — E782 Mixed hyperlipidemia: Secondary | ICD-10-CM

## 2015-11-07 DIAGNOSIS — I2510 Atherosclerotic heart disease of native coronary artery without angina pectoris: Secondary | ICD-10-CM

## 2015-11-07 DIAGNOSIS — Z125 Encounter for screening for malignant neoplasm of prostate: Secondary | ICD-10-CM | POA: Diagnosis not present

## 2015-11-07 LAB — URINALYSIS, ROUTINE W REFLEX MICROSCOPIC
Bilirubin Urine: NEGATIVE
Glucose, UA: NEGATIVE
Hgb urine dipstick: NEGATIVE
Ketones, ur: NEGATIVE
Leukocytes, UA: NEGATIVE
Nitrite: NEGATIVE
Protein, ur: NEGATIVE
Specific Gravity, Urine: 1.018 (ref 1.001–1.035)
pH: 6 (ref 5.0–8.0)

## 2015-11-07 LAB — CBC WITH DIFFERENTIAL/PLATELET
Basophils Absolute: 0 cells/uL (ref 0–200)
Basophils Relative: 0 %
Eosinophils Absolute: 220 cells/uL (ref 15–500)
Eosinophils Relative: 2 %
HCT: 47.4 % (ref 38.5–50.0)
Hemoglobin: 16.1 g/dL (ref 13.2–17.1)
Lymphocytes Relative: 24 %
Lymphs Abs: 2640 cells/uL (ref 850–3900)
MCH: 31.6 pg (ref 27.0–33.0)
MCHC: 34 g/dL (ref 32.0–36.0)
MCV: 92.9 fL (ref 80.0–100.0)
MPV: 9.8 fL (ref 7.5–12.5)
Monocytes Absolute: 1210 cells/uL — ABNORMAL HIGH (ref 200–950)
Monocytes Relative: 11 %
Neutro Abs: 6930 cells/uL (ref 1500–7800)
Neutrophils Relative %: 63 %
Platelets: 232 10*3/uL (ref 140–400)
RBC: 5.1 MIL/uL (ref 4.20–5.80)
RDW: 13.6 % (ref 11.0–15.0)
WBC: 11 10*3/uL — ABNORMAL HIGH (ref 3.8–10.8)

## 2015-11-07 LAB — BASIC METABOLIC PANEL WITH GFR
BUN: 15 mg/dL (ref 7–25)
CO2: 26 mmol/L (ref 20–31)
Calcium: 9 mg/dL (ref 8.6–10.3)
Chloride: 102 mmol/L (ref 98–110)
Creat: 1.07 mg/dL (ref 0.70–1.11)
GFR, Est African American: 75 mL/min (ref >=60)
GFR, Est Non African American: 65 mL/min (ref >=60)
Glucose, Bld: 85 mg/dL (ref 65–99)
Potassium: 4.6 mmol/L (ref 3.5–5.3)
Sodium: 138 mmol/L (ref 135–146)

## 2015-11-07 LAB — LIPID PANEL
Cholesterol: 165 mg/dL (ref 125–200)
HDL: 46 mg/dL (ref >=40)
LDL Cholesterol: 101 mg/dL (ref <130)
Total CHOL/HDL Ratio: 3.6 Ratio (ref <=5.0)
Triglycerides: 90 mg/dL (ref <150)
VLDL: 18 mg/dL (ref <30)

## 2015-11-07 LAB — MAGNESIUM: Magnesium: 2.1 mg/dL (ref 1.5–2.5)

## 2015-11-07 LAB — TSH: TSH: 0.41 mIU/L (ref 0.40–4.50)

## 2015-11-07 LAB — HEPATIC FUNCTION PANEL
ALT: 19 U/L (ref 9–46)
AST: 19 U/L (ref 10–35)
Albumin: 3.8 g/dL (ref 3.6–5.1)
Alkaline Phosphatase: 62 U/L (ref 40–115)
Bilirubin, Direct: 0.2 mg/dL (ref <=0.2)
Indirect Bilirubin: 0.7 mg/dL (ref 0.2–1.2)
Total Bilirubin: 0.9 mg/dL (ref 0.2–1.2)
Total Protein: 6.6 g/dL (ref 6.1–8.1)

## 2015-11-07 LAB — HEMOGLOBIN A1C
Hgb A1c MFr Bld: 5.7 % — ABNORMAL HIGH (ref <5.7)
Mean Plasma Glucose: 117 mg/dL

## 2015-11-07 NOTE — Patient Instructions (Signed)

## 2015-11-07 NOTE — Progress Notes (Signed)
Patient ID: Paul Frazier, male   DOB: [birth year], 82 y.o.   MRN: [id number] Comprehensive Evaluation & Examination  This very nice 82 y.o. MWM presents for a  comprehensive evaluation and management of multiple medical co-morbidities.  Patient has been followed for HTN, ASCAD Prediabetes, Hyperlipidemia and Vitamin D Deficiency. Patient has hx/o elevated PSA being followed by Dr Alinda Money.   HTN predates circa 67's & in 1988 patient underwent CABG. In 2001 . Patient's BP has been controlled at home.Today's BP: 136/62 mmHg. Patient denies any cardiac symptoms as chest pain, palpitations, shortness of breath, dizziness or ankle swelling.     Patient's hyperlipidemia is controlled with diet and medications. Patient denies myalgias or other medication SE's. Last lipids were at goal with Cholesterol 169; HDL 42; LDL 92; and slightly elevated Triglycerides 176 on 08/01/2015.   Patient has prediabetes since 2013 with A1c 5.8% and patient denies reactive hypoglycemic symptoms, visual blurring, diabetic polys or paresthesias. Last A1c was  % on 08/01/2015.   Finally, patient has history of Vitamin D Deficiency of 40 in 2011 and last vitamin D was still low at 41 on 05/01/2015.    Medication Sig  . aspirin 81 MG  Take 81 mg by mouth daily.  [name] buPROPion-XL 300 MG 24 hr tablet TAKE ONE TABLET BY MOUTH ONCE DAILY FOR  MOOD  . co-enzyme Q-10 30 MG  Take 60 mg by mouth daily.  . diazepam  5 MG tablet TAKE 1/2-1 TAB THREE TIMES A DAY AS NEEDED FOR MUSCLE SPASM  . Digestive Enzymes CAPS Take 1-2 capsules by mouth as needed (digestive function).   . Magnesium  Take 1 tablet by mouth daily.   . metoprolol succinate-XL 25 mg  TAKE ONE TABLET (25 MG TOTAL) BY MOUTH ONCE DAILY  . NITROSTAT 0.4 MG SL tablet Place 0.4 mg under the tongue every 5 (five) minutes as needed for chest pain.  [name] Specialty Vitamins Products (PROSTATE)  Take 2 tablets by mouth daily.    Allergies  Allergen Reactions  . Erythromycin Nausea Only    Past Medical History  Diagnosis Date  . Arthritis   . Hypertension   . CAD (coronary artery disease)   . Old myocardial infarct   . S/P CABG x 5 08/29/1984    LIMA to LAD,SVG to diagonal,SVG to OM,SVG to PDA and PLA  . Tremor, essential   . Hyperlipidemia    Health Maintenance  Topic Date Due  . PNA vac Low Risk Adult (2 of 2 - PCV13) 10/16/2015  . INFLUENZA VACCINE  05/04/2017 (Originally 02/17/2016)  . ZOSTAVAX  05/13/2017 (Originally 07/23/1994)  . TETANUS/TDAP  07/19/2021   Immunization History  Administered Date(s) Administered  . Pneumococcal Polysaccharide-23 10/16/2014  . Td 07/20/2011   Past Surgical History  Procedure Laterality Date  . Coronary artery bypass graft  1986    x5 LIMA to LAD,SVG to diagonal,SVG to OM,SVG to PDA and PLA  . Coronary stent placement    . Cholecystectomy  10/1990  . Inguinal hernia repair  04/25/2012    right- laparoscopic  . US echocardiography  12/27/2008    concentric LVH,mild to mod MR, TR and pulmonary hypertension. EF 45-50%  . R/p myoview  12/27/2008    no ischemia   Family History  Problem Relation Age of Onset  . Cancer Father     bone  . Cancer Brother     lymphnode    Social History   Social History  . Marital Status: Married  Spouse Name: [name]  . Number of Children: 3  . Years of Education: 14   Occupational History  .      retired   Social History Main Topics  . Smoking status: Former Smoker    Quit date: 07/19/1970  . Smokeless tobacco: Never Used  . Alcohol Use: No  . Drug Use: No  . Sexual Activity: Not on file   Other Topics Concern  . Not on file   Social History Narrative   Patient consumes 2 cups of caffeine daily,sometimes 1 cup in evening    ROS Constitutional: Denies fever, chills, weight loss/gain, headaches, insomnia,  night sweats or change in appetite. Does c/o fatigue. Eyes: Denies redness, blurred vision, diplopia, discharge, itchy or watery eyes.  ENT: Denies discharge,  congestion, post nasal drip, epistaxis, sore throat, earache, hearing loss, dental pain, Tinnitus, Vertigo, Sinus pain or snoring.  Cardio: Denies chest pain, palpitations, irregular heartbeat, syncope, dyspnea, diaphoresis, orthopnea, PND, claudication or edema Respiratory: denies cough, dyspnea, DOE, pleurisy, hoarseness, laryngitis or wheezing.  Gastrointestinal: Denies dysphagia, heartburn, reflux, water brash, pain, cramps, nausea, vomiting, bloating, diarrhea, constipation, hematemesis, melena, hematochezia, jaundice or hemorrhoids Genitourinary: Denies dysuria, frequency, urgency, nocturia, hesitancy, discharge, hematuria or flank pain Musculoskeletal: Denies arthralgia, myalgia, stiffness, Jt. Swelling, pain, limp or strain/sprain. Denies Falls. Skin: Denies puritis, rash, hives, warts, acne, eczema or change in skin lesion Neuro: No weakness, tremor, incoordination, spasms, paresthesia or pain Psychiatric: Denies confusion, memory loss or sensory loss. Denies Depression. Endocrine: Denies change in weight, skin, hair change, nocturia, and paresthesia, diabetic polys, visual blurring or hyper / hypo glycemic episodes.  Heme/Lymph: No excessive bleeding, bruising or enlarged lymph nodes.  Physical Exam  BP 136/62 mmHg  Pulse 56  Temp(Src) 97.5 F (36.4 C)  Resp 16  Ht 5' 6.5" (1.689 m)  Wt 168 lb 3.2 oz (76.295 kg)  BMI 26.74 kg/m2  General Appearance: Well nourished, in no apparent distress.  Eyes: PERRLA, EOMs, conjunctiva no swelling or erythema, normal fundi and vessels. Sinuses: No frontal/maxillary tenderness ENT/Mouth: EACs patent / TMs  nl. Nares clear without erythema, swelling, mucoid exudates. Oral hygiene is good. No erythema, swelling, or exudate. Tongue normal, non-obstructing. Tonsils not swollen or erythematous. Hearing normal.  Neck: Supple, thyroid normal. No bruits, nodes or JVD. Respiratory: Respiratory effort normal.  BS equal and clear bilateral without  rales, rhonci, wheezing or stridor. Cardio: Heart sounds are normal with regular rate and rhythm and no murmurs, rubs or gallops. Peripheral pulses are normal and equal bilaterally without edema. No aortic or femoral bruits. Chest: symmetric with normal excursions and percussion.  Abdomen: Soft, with Nl bowel sounds. Nontender, no guarding, rebound, hernias, masses, or organomegaly.  Lymphatics: Non tender without lymphadenopathy.  Genitourinary: Defferred to Dr Alinda Money Musculoskeletal: Full ROM all peripheral extremities, joint stability, 5/5 strength, and normal gait. Skin: Warm and dry without rashes, lesions, cyanosis, clubbing or  ecchymosis.  Neuro: Cranial nerves intact, reflexes equal bilaterally. High freq/low amplitude tremor of hands. Normal muscle tone, no cerebellar symptoms. Sensation intact.  Pysch: Alert and oriented X 3 with normal affect, insight and judgment appropriate.   Assessment and Plan  1. Essential hypertension  - Microalbumin / creatinine urine ratio - EKG 12-Lead - [place], RETROPERITNL ABD,  LTD - TSH  2. Hyperlipidemia  - Lipid panel - TSH  3. Other abnormal glucose  - Hemoglobin A1c - Insulin, random  4. Vitamin D deficiency  - VITAMIN D 25 Hydroxy   5. Coronary artery disease  involving native coronary artery of native heart without angina pectoris   6. Screening for rectal cancer  - POC Hemoccult Bld/Stl   7. Prostate cancer screening  - PSA  8. Bladder neck obstruction  - PSA  9. Medication management  - Urinalysis, Routine w reflex microscopic  - CBC with Differential/Platelet - BASIC METABOLIC PANEL WITH GFR - Hepatic function panel - Magnesium  10. Screening for ischemic heart disease   11. Screening for AAA (aortic abdominal aneurysm)    Continue prudent diet as discussed, weight control, BP monitoring, regular exercise, and medications as discussed.  Discussed med effects and SE's. Routine screening labs and tests as  requested with regular follow-up as recommended. Over 40 minutes of exam, counseling, chart review and high complex critical decision making was performed

## 2015-11-08 LAB — MICROALBUMIN / CREATININE URINE RATIO
Creatinine, Urine: 150 mg/dL (ref 20–370)
Microalb Creat Ratio: 3 mcg/mg creat (ref <30)
Microalb, Ur: 0.5 mg/dL

## 2015-11-08 LAB — VITAMIN D 25 HYDROXY (VIT D DEFICIENCY, FRACTURES): Vit D, 25-Hydroxy: 62 ng/mL (ref 30–100)

## 2015-11-08 LAB — PSA: PSA: 34.76 ng/mL — ABNORMAL HIGH (ref <=4.00)

## 2015-11-10 LAB — INSULIN, RANDOM: Insulin: 3.8 u[IU]/mL (ref 2.0–19.6)

## 2015-11-15 ENCOUNTER — Other Ambulatory Visit: Payer: Self-pay | Admitting: Internal Medicine

## 2015-11-18 ENCOUNTER — Other Ambulatory Visit: Payer: Self-pay | Admitting: *Deleted

## 2015-11-18 DIAGNOSIS — Z1212 Encounter for screening for malignant neoplasm of rectum: Secondary | ICD-10-CM

## 2015-11-18 LAB — POC HEMOCCULT BLD/STL (HOME/3-CARD/SCREEN)
Card #2 Fecal Occult Blod, POC: NEGATIVE
Card #3 Fecal Occult Blood, POC: NEGATIVE
Fecal Occult Blood, POC: NEGATIVE

## 2015-12-10 ENCOUNTER — Ambulatory Visit (INDEPENDENT_AMBULATORY_CARE_PROVIDER_SITE_OTHER): Payer: Medicare Other | Admitting: Cardiovascular Disease

## 2015-12-10 ENCOUNTER — Encounter: Payer: Self-pay | Admitting: Cardiovascular Disease

## 2015-12-10 VITALS — BP 140/64 | HR 68 | Ht 66.5 in | Wt 175.0 lb

## 2015-12-10 DIAGNOSIS — I447 Left bundle-branch block, unspecified: Secondary | ICD-10-CM

## 2015-12-10 DIAGNOSIS — I255 Ischemic cardiomyopathy: Secondary | ICD-10-CM | POA: Diagnosis not present

## 2015-12-10 DIAGNOSIS — I25118 Atherosclerotic heart disease of native coronary artery with other forms of angina pectoris: Secondary | ICD-10-CM | POA: Diagnosis not present

## 2015-12-10 DIAGNOSIS — I472 Ventricular tachycardia: Secondary | ICD-10-CM

## 2015-12-10 DIAGNOSIS — I10 Essential (primary) hypertension: Secondary | ICD-10-CM | POA: Diagnosis not present

## 2015-12-10 DIAGNOSIS — I4729 Other ventricular tachycardia: Secondary | ICD-10-CM

## 2015-12-10 DIAGNOSIS — E782 Mixed hyperlipidemia: Secondary | ICD-10-CM

## 2015-12-10 MED ORDER — NITROGLYCERIN 0.4 MG SL SUBL: 0.4000 mg | SUBLINGUAL_TABLET | SUBLINGUAL | Status: DC | PRN

## 2015-12-10 NOTE — Progress Notes (Signed)
Patient ID: Paul Frazier, male   DOB: [birth year], 82 y.o.   MRN: [id number]    Cardiology Office Note    Date:  12/10/2015   ID:  Paul Frazier, DOB 02/22/1933, MRN [id number]  PCP:  Alesia Richards, MD  Cardiologist:   Sanda Klein, MD   Chief Complaint  Patient presents with  . Follow-up    pt c/o occasional dizziness--diagnosed with vertigo, taking med.    History of Present Illness:  Paul Frazier is a 82 y.o. male long-standing history of coronary artery disease and moderate ischemic cardiomyopathy without overt congestive heart failure here for follow-up.  Since his last appointment he has generally done well. He has had only one longer episode of angina pectoris, this was associated with a spontaneous erection, subsiding as soon as that subsided. He denies shortness of breath, palpitations, dizziness, lightheadedness or syncope. He is not particularly active and does not have angina pectoris except when he "pushes it". He has not taken any sublingual mitral glycerin and years. He does virtually all the housework and cooking, since his wife [name] is problems with chronic pain and depression.  He has not had any new episodes of confusion or disorientation as reported by his wife last November.  He has a complicated history of coronary disease and underwent bypass surgery in 1986. He has total occlusion of his LAD artery and his dominant left circumflex coronary artery is graft dependent. He presented with a moderate size non-ST segment elevation myocardial infarction in the setting of paroxysmal supraventricular tachycardia in October 2012 and was treated in [place], [place]. The catheterization report is a little confusing since it describes grafts with different locations than those described at the time of his bypass procedure, however it appears that he has lost the sequential bypass to the oblique marginal artery. A more recent nuclear stress test (May 2014) showed  that his left ventricular ejection fraction was down to 42% with inferolateral and anterolateral scar and hypokinesia but without any reversible ischemic abnormalities.  I was concerned that he had possibly developed disease in the sequential saphenous vein graft to the PDA/PLA based on that report and recommended coronary angiography but he wanted to proceed with conservative management. He has not had any serious event since that time. As more and more time passes since that last evaluation the urgency of coronary angiography/revascularization becomes less and less.  Past Medical History  Diagnosis Date  . Arthritis   . Hypertension   . CAD (coronary artery disease)   . Old myocardial infarct   . S/P CABG x 5 08/29/1984    LIMA to LAD,SVG to diagonal,SVG to OM,SVG to PDA and PLA  . Tremor, essential   . Hyperlipidemia     Past Surgical History  Procedure Laterality Date  . Coronary artery bypass graft  1986    x5 LIMA to LAD,SVG to diagonal,SVG to OM,SVG to PDA and PLA  . Coronary stent placement    . Cholecystectomy  10/1990  . Inguinal hernia repair  04/25/2012    right- laparoscopic  . US echocardiography  12/27/2008    concentric LVH,mild to mod MR, TR and pulmonary hypertension. EF 45-50%  . R/p myoview  12/27/2008    no ischemia    Current Medications: Outpatient Prescriptions Prior to Visit  Medication Sig Dispense Refill  . aspirin 81 MG tablet Take 81 mg by mouth daily.    [name] buPROPion (WELLBUTRIN XL) 300 MG 24 hr tablet TAKE ONE TABLET BY MOUTH ONCE  DAILY FOR  MOOD 90 tablet 2  . co-enzyme Q-10 30 MG capsule Take 60 mg by mouth daily.    . diazepam (VALIUM) 5 MG tablet TAKE 1/2-1 TAB THREE TIMES A DAY AS NEEDED FOR MUSCLE SPASM 90 tablet 2  . Digestive Enzymes CAPS Take 1-2 capsules by mouth as needed (digestive function).     . Magnesium Chloride (MAGNESIUM DR PO) Take 1 tablet by mouth daily.     . metoprolol succinate (TOPROL-XL) 25 MG 24 hr tablet TAKE ONE TABLET BY  MOUTH ONCE DAILY 90 tablet 1  . Specialty Vitamins Products (PROSTATE) TABS Take 2 tablets by mouth daily.     . nitroGLYCERIN (NITROSTAT) 0.4 MG SL tablet Place 0.4 mg under the tongue every 5 (five) minutes as needed for chest pain.     No facility-administered medications prior to visit.     Allergies:   Erythromycin   Social History   Social History  . Marital Status: Married    Spouse Name: [name]  . Number of Children: 3  . Years of Education: 14   Occupational History  .      retired   Social History Main Topics  . Smoking status: Former Smoker    Quit date: 07/19/1970  . Smokeless tobacco: Never Used  . Alcohol Use: No  . Drug Use: No  . Sexual Activity: Not Asked   Other Topics Concern  . None   Social History Narrative   Patient consumes 2 cups of caffeine daily,sometimes 1 cup in evening     Family History:  The patient's family history includes Cancer in his brother and father.   ROS:   Please see the history of present illness.    ROS All other systems reviewed and are negative.   PHYSICAL EXAM:   VS:  BP 140/64 mmHg  Pulse 68  Ht 5' 6.5" (1.689 m)  Wt 79.379 kg (175 lb)  BMI 27.83 kg/m2   GEN: Well nourished, well developed, in no acute distress HEENT: normal Neck: no JVD, carotid bruits, or masses Cardiac: Paradoxically split S2, RRR; no murmurs, rubs, or gallops,no edema  Respiratory:  clear to auscultation bilaterally, normal work of breathing GI: soft, nontender, nondistended, + BS MS: no deformity or atrophy Skin: warm and dry, no rash Neuro:  Alert and Oriented x 3, Strength and sensation are intact Psych: euthymic mood, full affect  Wt Readings from Last 3 Encounters:  12/10/15 79.379 kg (175 lb)  11/07/15 76.295 kg (168 lb 3.2 oz)  09/04/15 77.565 kg (171 lb)      Studies/Labs Reviewed:   EKG:  EKG is not ordered today.  The ekg ordered April 21 demonstrates sinus rhythm with first-degree AV block, or interval 230 ms, left  bundle branch block, QRS 136 ms, QTC 447 ms  Recent Labs: 11/07/2015: ALT 19; BUN 15; Creat 1.07; Hemoglobin 16.1; Magnesium 2.1; Platelets 232; Potassium 4.6; Sodium 138; TSH 0.41   Lipid Panel    Component Value Date/Time   CHOL 165 11/07/2015 1106   TRIG 90 11/07/2015 1106   HDL 46 11/07/2015 1106   CHOLHDL 3.6 11/07/2015 1106   VLDL 18 11/07/2015 1106   LDLCALC 101 11/07/2015 1106    Additional studies/ records that were reviewed today include:  Notes from Dr. Melford Aase    ASSESSMENT:    1. Coronary artery disease involving native coronary artery of native heart with other form of angina pectoris ([place])   2. Cardiomyopathy, ischemic   3. LBBB (  left bundle branch block)   4. Essential hypertension   5. Nonsustained ventricular tachycardia ([place])   6. Hyperlipidemia      PLAN:  In order of problems listed above:  1. CAD s/p CABG; Stable angina pectoris: His episodes of angina are always precipitated by some form of increased cardiac demand. Symptoms are well controlled on a low dose of beta blocker and we are trying to avoid additional antianginals due to history of orthostatic hypotension. He prefers to avoid revascularization procedures. 2. Isch CMP: He has mild -moderate left ventricular systolic dysfunction, without overt congestive heart failure. He appears clinically euvolemic, NYHA functional class I-II. He does not require loop diuretics. He is not on ACE inhibitor due to problems with symptomatic hypotension the past. 3. LBBB: Also has PR prolongation suggesting extensive conduction system disease. No recent episodes of dizziness or syncope to suggest high-grade AV block or need for pacemaker therapy. 4. HTN: Fair control. Blood pressure was even lower when he saw Dr. Melford Aase last month. Avoid "perfect" control due to history of symptomatic hypotension. 5. NSVT: Previously detected by the monitor, no symptoms recently to suggest this 6. HLP: Previously intolerant of  statins. His most recent lipid profile act she shows an acceptable level of HDL and LDL cholesterol without treatment. He does not wish to consider other lipid-lowering options at this time.    Medication Adjustments/Labs and Tests Ordered: Current medicines are reviewed at length with the patient today.  Concerns regarding medicines are outlined above.  Medication changes, Labs and Tests ordered today are listed in the Patient Instructions below. Patient Instructions  Your physician recommends that you continue on your current medications as directed. Please refer to the Current Medication list given to you today.  Dr Sallyanne Kuster recommends that you schedule a follow-up appointment in 1 year. You will receive a reminder letter in the mail two months in advance. If you don't receive a letter, please call our office to schedule the follow-up appointment.  If you need a refill on your cardiac medications before your next appointment, please call your pharmacy.    Signed, Sanda Klein, MD  12/10/2015 5:51 PM    [address], [place], [place]  [address] Phone: [phone]; Fax: [phone]

## 2015-12-10 NOTE — Patient Instructions (Signed)
Your physician recommends that you continue on your current medications as directed. Please refer to the Current Medication list given to you today.  Dr Croitoru recommends that you schedule a follow-up appointment in 1 year. You will receive a reminder letter in the mail two months in advance. If you don't receive a letter, please call our office to schedule the follow-up appointment.  If you need a refill on your cardiac medications before your next appointment, please call your pharmacy. 

## 2015-12-19 ENCOUNTER — Ambulatory Visit: Payer: Self-pay | Admitting: Internal Medicine

## 2015-12-29 ENCOUNTER — Other Ambulatory Visit: Payer: Self-pay | Admitting: *Deleted

## 2015-12-29 MED ORDER — DIAZEPAM 5 MG PO TABS: ORAL_TABLET | ORAL | Status: DC

## 2015-12-30 ENCOUNTER — Ambulatory Visit (INDEPENDENT_AMBULATORY_CARE_PROVIDER_SITE_OTHER): Payer: Medicare Other | Admitting: Internal Medicine

## 2015-12-30 ENCOUNTER — Encounter: Payer: Self-pay | Admitting: Internal Medicine

## 2015-12-30 VITALS — BP 118/58 | HR 56 | Temp 98.0°F | Resp 16 | Ht 66.5 in | Wt 178.0 lb

## 2015-12-30 DIAGNOSIS — G609 Hereditary and idiopathic neuropathy, unspecified: Secondary | ICD-10-CM | POA: Diagnosis not present

## 2015-12-30 DIAGNOSIS — F329 Major depressive disorder, single episode, unspecified: Secondary | ICD-10-CM | POA: Diagnosis not present

## 2015-12-30 DIAGNOSIS — Z2829 Immunization not carried out because of patient decision for other reason: Secondary | ICD-10-CM

## 2015-12-30 DIAGNOSIS — I10 Essential (primary) hypertension: Secondary | ICD-10-CM

## 2015-12-30 DIAGNOSIS — Z0000 Encounter for general adult medical examination without abnormal findings: Secondary | ICD-10-CM

## 2015-12-30 DIAGNOSIS — R6889 Other general symptoms and signs: Secondary | ICD-10-CM | POA: Diagnosis not present

## 2015-12-30 DIAGNOSIS — E559 Vitamin D deficiency, unspecified: Secondary | ICD-10-CM

## 2015-12-30 DIAGNOSIS — I4729 Other ventricular tachycardia: Secondary | ICD-10-CM

## 2015-12-30 DIAGNOSIS — Z0001 Encounter for general adult medical examination with abnormal findings: Secondary | ICD-10-CM

## 2015-12-30 DIAGNOSIS — I255 Ischemic cardiomyopathy: Secondary | ICD-10-CM | POA: Diagnosis not present

## 2015-12-30 DIAGNOSIS — R7309 Other abnormal glucose: Secondary | ICD-10-CM

## 2015-12-30 DIAGNOSIS — I472 Ventricular tachycardia: Secondary | ICD-10-CM

## 2015-12-30 DIAGNOSIS — Z79899 Other long term (current) drug therapy: Secondary | ICD-10-CM

## 2015-12-30 DIAGNOSIS — I25118 Atherosclerotic heart disease of native coronary artery with other forms of angina pectoris: Secondary | ICD-10-CM

## 2015-12-30 DIAGNOSIS — Z6826 Body mass index (BMI) 26.0-26.9, adult: Secondary | ICD-10-CM

## 2015-12-30 DIAGNOSIS — E782 Mixed hyperlipidemia: Secondary | ICD-10-CM

## 2015-12-30 DIAGNOSIS — I447 Left bundle-branch block, unspecified: Secondary | ICD-10-CM

## 2015-12-30 DIAGNOSIS — C44219 Basal cell carcinoma of skin of left ear and external auricular canal: Secondary | ICD-10-CM

## 2015-12-30 DIAGNOSIS — F32A Depression, unspecified: Secondary | ICD-10-CM

## 2015-12-30 NOTE — Progress Notes (Signed)
MEDICARE ANNUAL WELLNESS VISIT AND FOLLOW UP Assessment:   1. Essential hypertension -well controlled -recommended wearing compression stockings -Diet and exercise as tolerated -cont to monitor - TSH  2. Hyperlipidemia -cont statins - Lipid panel  3. Other abnormal glucose -diet and exercise as tolerated - Hemoglobin A1c  4. Vitamin D deficiency -cont Vit D  5. Medication management  - CBC with Differential/Platelet - BASIC METABOLIC PANEL WITH GFR - Hepatic function panel - Magnesium  6. Hereditary and idiopathic peripheral neuropathy -offered gabapentin which she declined - Iron and TIBC - Vitamin B12  7. Coronary artery disease involving native coronary artery of native heart with other form of angina pectoris ([place]) -followed by Dr. Benn Moulder  8. Cardiomyopathy, ischemic -followed by Dr. Benn Moulder  9. LBBB (left bundle branch block) -followed by Dr. Benn Moulder  10. Nonsustained ventricular tachycardia ([place]) -followed by Dr. Benn Moulder  11. Basal cell carcinoma, ear, left -followed by Derm  12. Depression, controlled -cont meds  13. BMI 26.74,  adult -diet and exercise as tolerated  14. Refusal of influenza vaccine by provider -will offer again in october  15. Routine general medical examination at a health care facility -due next year     Over 30 minutes of exam, counseling, chart review, and critical decision making was performed  Plan:   During the course of the visit the patient was educated and counseled about appropriate screening and preventive services including:    Pneumococcal vaccine   Influenza vaccine  Prevnar 13  Td vaccine  Screening electrocardiogram  Colorectal cancer screening  Diabetes screening  Glaucoma screening  Nutrition counseling   Conditions/risks identified: BMI: Discussed weight loss, diet, and increase physical activity.  Increase physical activity: AHA recommends 150 minutes of physical activity a  week.  Medications reviewed Diabetes is at goal, ACE/ARB therapy: No, Reason not on Ace Inhibitor/ARB therapy:  not indicated Urinary Incontinence is an issue: discussed non pharmacology and pharmacology options.  Fall risk: low- discussed PT, home fall assessment, medications.    Subjective:  Paul Frazier is a 82 y.o. male who presents for Medicare Annual Wellness Visit and 3 month follow up for HTN, hyperlipidemia, prediabetes, and vitamin D Def.  Date of last medicare wellness visit was is unknown.  His blood pressure has been controlled at home, today their BP is BP: (!) 118/58 mmHg He does workout. He denies chest pain, shortness of breath, dizziness.  He is on cholesterol medication and denies myalgias. His cholesterol is at goal. The cholesterol last visit was:   Lab Results  Component Value Date   CHOL 165 11/07/2015   HDL 46 11/07/2015   LDLCALC 101 11/07/2015   TRIG 90 11/07/2015   CHOLHDL 3.6 11/07/2015   He has been working on diet and exercise for prediabetes, and denies foot ulcerations, hyperglycemia, hypoglycemia , increased appetite, nausea, polydipsia, polyuria, visual disturbances, vomiting and weight loss. Last A1C in the office was:  Lab Results  Component Value Date   HGBA1C 5.7* 11/07/2015   Last GFR Lab Results  Component Value Date   GFRNONAA 65 11/07/2015     Lab Results  Component Value Date   GFRAA 75 11/07/2015   Patient is on Vitamin D supplement.   Lab Results  Component Value Date   VD25OH 73 11/07/2015     He reports that he feels like his legs have been bothersome lately.  He feels like his feet are asleep all the time.  He reports that he also feels like his  legs are weak and they give out sometimes.  He does have some pain in the back of his thighs.  He reports that he does have pain in the back of his thighs as well and this occurs when he is standing or walking or working.  He does not have it with rest.  He has not fallen due to his  legs buckling. He does have some difficulty with going up and down stairs.    He is currently going to [employer]ogenics for his knees.    Medication Review: Current Outpatient Prescriptions on File Prior to Visit  Medication Sig Dispense Refill  . aspirin 81 MG tablet Take 81 mg by mouth daily.    [name] buPROPion (WELLBUTRIN XL) 300 MG 24 hr tablet TAKE ONE TABLET BY MOUTH ONCE DAILY FOR  MOOD 90 tablet 2  . co-enzyme Q-10 30 MG capsule Take 60 mg by mouth daily.    . diazepam (VALIUM) 5 MG tablet TAKE 1/2-1 TAB THREE TIMES A DAY AS NEEDED FOR MUSCLE SPASM 90 tablet 0  . Digestive Enzymes CAPS Take 1-2 capsules by mouth as needed (digestive function).     . Magnesium Chloride (MAGNESIUM DR PO) Take 1 tablet by mouth daily.     . metoprolol succinate (TOPROL-XL) 25 MG 24 hr tablet TAKE ONE TABLET BY MOUTH ONCE DAILY 90 tablet 1  . nitroGLYCERIN (NITROSTAT) 0.4 MG SL tablet Place 1 tablet (0.4 mg total) under the tongue every 5 (five) minutes as needed for chest pain. 25 tablet 3  . Specialty Vitamins Products (PROSTATE) TABS Take 2 tablets by mouth daily.      No current facility-administered medications on file prior to visit.    Current Problems (verified) Patient Active Problem List   Diagnosis Date Noted  . Basal cell carcinoma, ear 08/01/2015  . BMI 26.74,  adult 05/01/2015  . Refusal of influenza vaccine by provider 05/01/2015  . Depression, controlled 09/11/2014  . Essential hypertension 01/01/2014  . Vitamin D deficiency 01/01/2014  . Medication management 01/01/2014  . Other abnormal glucose 01/01/2014  . Nonsustained ventricular tachycardia ([place]) 05/23/2013  . CAD (coronary artery disease) 01/08/2013  . Cardiomyopathy, ischemic 01/08/2013  . Hyperlipidemia 01/08/2013  . LBBB (left bundle branch block) 01/08/2013    Screening Tests Immunization History  Administered Date(s) Administered  . Pneumococcal Polysaccharide-23 10/16/2014  . Td 07/20/2011    Preventative  care: Last colonoscopy: 2016  Prior vaccinations: TD or Tdap: 2013  Influenza: Declined  Pneumococcal: 2016 Prevnar13: Due but out stock  Names of Other Physician/Practitioners you currently use: 1. [facility] here for primary care 2. Has not been recently , eye doctor, last visit  3. Does not see one, dentist, last visit several years ago Patient Care Team: Unk Pinto, MD as PCP - General (Internal Medicine) [name] Age, MD as Attending Physician (Neurology) Sanda Klein, MD as Consulting Physician (Cardiology) Raynelle Bring, MD as Consulting Physician (Urology)  Past Surgical History  Procedure Laterality Date  . Coronary artery bypass graft  1986    x5 LIMA to LAD,SVG to diagonal,SVG to OM,SVG to PDA and PLA  . Coronary stent placement    . Cholecystectomy  10/1990  . Inguinal hernia repair  04/25/2012    right- laparoscopic  . US echocardiography  12/27/2008    concentric LVH,mild to mod MR, TR and pulmonary hypertension. EF 45-50%  . R/p myoview  12/27/2008    no ischemia   Family History  Problem Relation  Age of Onset  . Cancer Father     bone  . Cancer Brother     lymphnode   Social History  Substance Use Topics  . Smoking status: Former Smoker    Quit date: 07/19/1970  . Smokeless tobacco: Never Used  . Alcohol Use: No   Allergies  Allergen Reactions  . Erythromycin Nausea Only    MEDICARE WELLNESS OBJECTIVES: Tobacco use: He does not smoke.  Patient is a former smoker. If yes, counseling given Alcohol Current alcohol use: social drinker Diet: in general, an "unhealthy" diet Physical activity:   Cardiac risk factors:   Depression/mood screen:   Depression screen [facility] 2/9 11/07/2015  Decreased Interest 0  Down, Depressed, Hopeless 0  PHQ - 2 Score 0    ADLs:  In your present state of health, do you have any difficulty performing the following activities: 11/07/2015 09/05/2015  Hearing? N N  Vision? N N   Difficulty concentrating or making decisions? N N  Walking or climbing stairs? N N  Dressing or bathing? N N  Doing errands, shopping? N N     Cognitive Testing  Alert? Yes  Normal Appearance?Yes  Oriented to person? Yes  Place? Yes   Time? Yes  Recall of three objects?  Yes  Can perform simple calculations? Yes  Displays appropriate judgment?Yes  Can read the correct time from a watch face?Yes  EOL planning:     Objective:   Today's Vitals   12/30/15 1453  BP: 118/58  Pulse: 56  Temp: 98 F (36.7 C)  TempSrc: Temporal  Resp: 16  Height: 5' 6.5" (1.689 m)  Weight: 178 lb (80.74 kg)   Body mass index is 28.3 kg/(m^2).  General appearance: alert, no distress, WD/WN, male HEENT: normocephalic, sclerae anicteric, TMs pearly, nares patent, no discharge or erythema, pharynx normal Oral cavity: MMM, no lesions Neck: supple, no lymphadenopathy, no thyromegaly, no masses Heart: RRR, normal S1, S2, no murmurs Lungs: CTA bilaterally, no wheezes, rhonchi, or rales Abdomen: +bs, soft, non tender, non distended, no masses, no hepatomegaly, no splenomegaly Musculoskeletal: nontender, no swelling, no obvious deformity Extremities: no edema, no cyanosis, no clubbing Pulses: 2+ symmetric, upper and lower extremities, normal cap refill Neurological: alert, oriented x 3, CN2-12 intact, strength normal upper extremities and lower extremities, sensation normal throughout, DTRs 2+ throughout, no cerebellar signs, gait normal Psychiatric: normal affect, behavior normal, pleasant   Medicare Attestation I have personally reviewed: The patient's medical and social history Their use of alcohol, tobacco or illicit drugs Their current medications and supplements The patient's functional ability including ADLs,fall risks, home safety risks, cognitive, and hearing and visual impairment Diet and physical activities Evidence for depression or mood disorders  The patient's weight, height, BMI,  and visual acuity have been recorded in the chart.  I have made referrals, counseling, and provided education to the patient based on review of the above and I have provided the patient with a written personalized care plan for preventive services.     Starlyn Skeans, PA-C   12/30/2015

## 2015-12-31 DIAGNOSIS — M25562 Pain in left knee: Secondary | ICD-10-CM | POA: Diagnosis not present

## 2015-12-31 DIAGNOSIS — R2689 Other abnormalities of gait and mobility: Secondary | ICD-10-CM | POA: Diagnosis not present

## 2015-12-31 DIAGNOSIS — M170 Bilateral primary osteoarthritis of knee: Secondary | ICD-10-CM | POA: Diagnosis not present

## 2015-12-31 DIAGNOSIS — M25561 Pain in right knee: Secondary | ICD-10-CM | POA: Diagnosis not present

## 2015-12-31 LAB — CBC WITH DIFFERENTIAL/PLATELET
Basophils Absolute: 0 cells/uL (ref 0–200)
Basophils Relative: 0 %
Eosinophils Absolute: 243 cells/uL (ref 15–500)
Eosinophils Relative: 3 %
HCT: 45 % (ref 38.5–50.0)
Hemoglobin: 14.9 g/dL (ref 13.2–17.1)
Lymphocytes Relative: 34 %
Lymphs Abs: 2754 cells/uL (ref 850–3900)
MCH: 30.5 pg (ref 27.0–33.0)
MCHC: 33.1 g/dL (ref 32.0–36.0)
MCV: 92 fL (ref 80.0–100.0)
MPV: 10.2 fL (ref 7.5–12.5)
Monocytes Absolute: 567 cells/uL (ref 200–950)
Monocytes Relative: 7 %
Neutro Abs: 4536 cells/uL (ref 1500–7800)
Neutrophils Relative %: 56 %
Platelets: 201 10*3/uL (ref 140–400)
RBC: 4.89 MIL/uL (ref 4.20–5.80)
RDW: 14.3 % (ref 11.0–15.0)
WBC: 8.1 10*3/uL (ref 3.8–10.8)

## 2015-12-31 LAB — HEPATIC FUNCTION PANEL
ALT: 27 U/L (ref 9–46)
AST: 27 U/L (ref 10–35)
Albumin: 3.8 g/dL (ref 3.6–5.1)
Alkaline Phosphatase: 59 U/L (ref 40–115)
Bilirubin, Direct: 0.1 mg/dL (ref <=0.2)
Indirect Bilirubin: 0.2 mg/dL (ref 0.2–1.2)
Total Bilirubin: 0.3 mg/dL (ref 0.2–1.2)
Total Protein: 6.2 g/dL (ref 6.1–8.1)

## 2015-12-31 LAB — MAGNESIUM: Magnesium: 2 mg/dL (ref 1.5–2.5)

## 2015-12-31 LAB — LIPID PANEL
Cholesterol: 163 mg/dL (ref 125–200)
HDL: 43 mg/dL (ref >=40)
LDL Cholesterol: 88 mg/dL (ref <130)
Total CHOL/HDL Ratio: 3.8 Ratio (ref <=5.0)
Triglycerides: 159 mg/dL — ABNORMAL HIGH (ref <150)
VLDL: 32 mg/dL — ABNORMAL HIGH (ref <30)

## 2015-12-31 LAB — BASIC METABOLIC PANEL WITH GFR
BUN: 17 mg/dL (ref 7–25)
CO2: 26 mmol/L (ref 20–31)
Calcium: 8.9 mg/dL (ref 8.6–10.3)
Chloride: 106 mmol/L (ref 98–110)
Creat: 1.11 mg/dL (ref 0.70–1.11)
GFR, Est African American: 72 mL/min (ref >=60)
GFR, Est Non African American: 62 mL/min (ref >=60)
Glucose, Bld: 125 mg/dL — ABNORMAL HIGH (ref 65–99)
Potassium: 4.4 mmol/L (ref 3.5–5.3)
Sodium: 141 mmol/L (ref 135–146)

## 2015-12-31 LAB — IRON AND TIBC
%SAT: 31 % (ref 15–60)
Iron: 92 ug/dL (ref 50–180)
TIBC: 294 ug/dL (ref 250–425)
UIBC: 202 ug/dL (ref 125–400)

## 2015-12-31 LAB — TSH: TSH: 0.37 mIU/L — ABNORMAL LOW (ref 0.40–4.50)

## 2015-12-31 LAB — VITAMIN B12: Vitamin B-12: 644 pg/mL (ref 200–1100)

## 2015-12-31 LAB — HEMOGLOBIN A1C
Hgb A1c MFr Bld: 6.1 % — ABNORMAL HIGH (ref <5.7)
Mean Plasma Glucose: 128 mg/dL

## 2016-01-08 DIAGNOSIS — R2689 Other abnormalities of gait and mobility: Secondary | ICD-10-CM | POA: Diagnosis not present

## 2016-01-08 DIAGNOSIS — M170 Bilateral primary osteoarthritis of knee: Secondary | ICD-10-CM | POA: Diagnosis not present

## 2016-01-08 DIAGNOSIS — M25561 Pain in right knee: Secondary | ICD-10-CM | POA: Diagnosis not present

## 2016-01-08 DIAGNOSIS — M25562 Pain in left knee: Secondary | ICD-10-CM | POA: Diagnosis not present

## 2016-01-15 DIAGNOSIS — M25562 Pain in left knee: Secondary | ICD-10-CM | POA: Diagnosis not present

## 2016-01-15 DIAGNOSIS — M25561 Pain in right knee: Secondary | ICD-10-CM | POA: Diagnosis not present

## 2016-01-15 DIAGNOSIS — M170 Bilateral primary osteoarthritis of knee: Secondary | ICD-10-CM | POA: Diagnosis not present

## 2016-01-15 DIAGNOSIS — R2689 Other abnormalities of gait and mobility: Secondary | ICD-10-CM | POA: Diagnosis not present

## 2016-01-20 IMAGING — CR DG SHOULDER 2+V*R*
3 series · 3 of 3 positions shown · non-contrast
Comparison: None.

CLINICAL DATA: Pt states no trauma Pain in humeral head, laterally,
for the past 1-2 months

EXAM:
RIGHT SHOULDER - 2+ VIEW

[shoulder axillary]
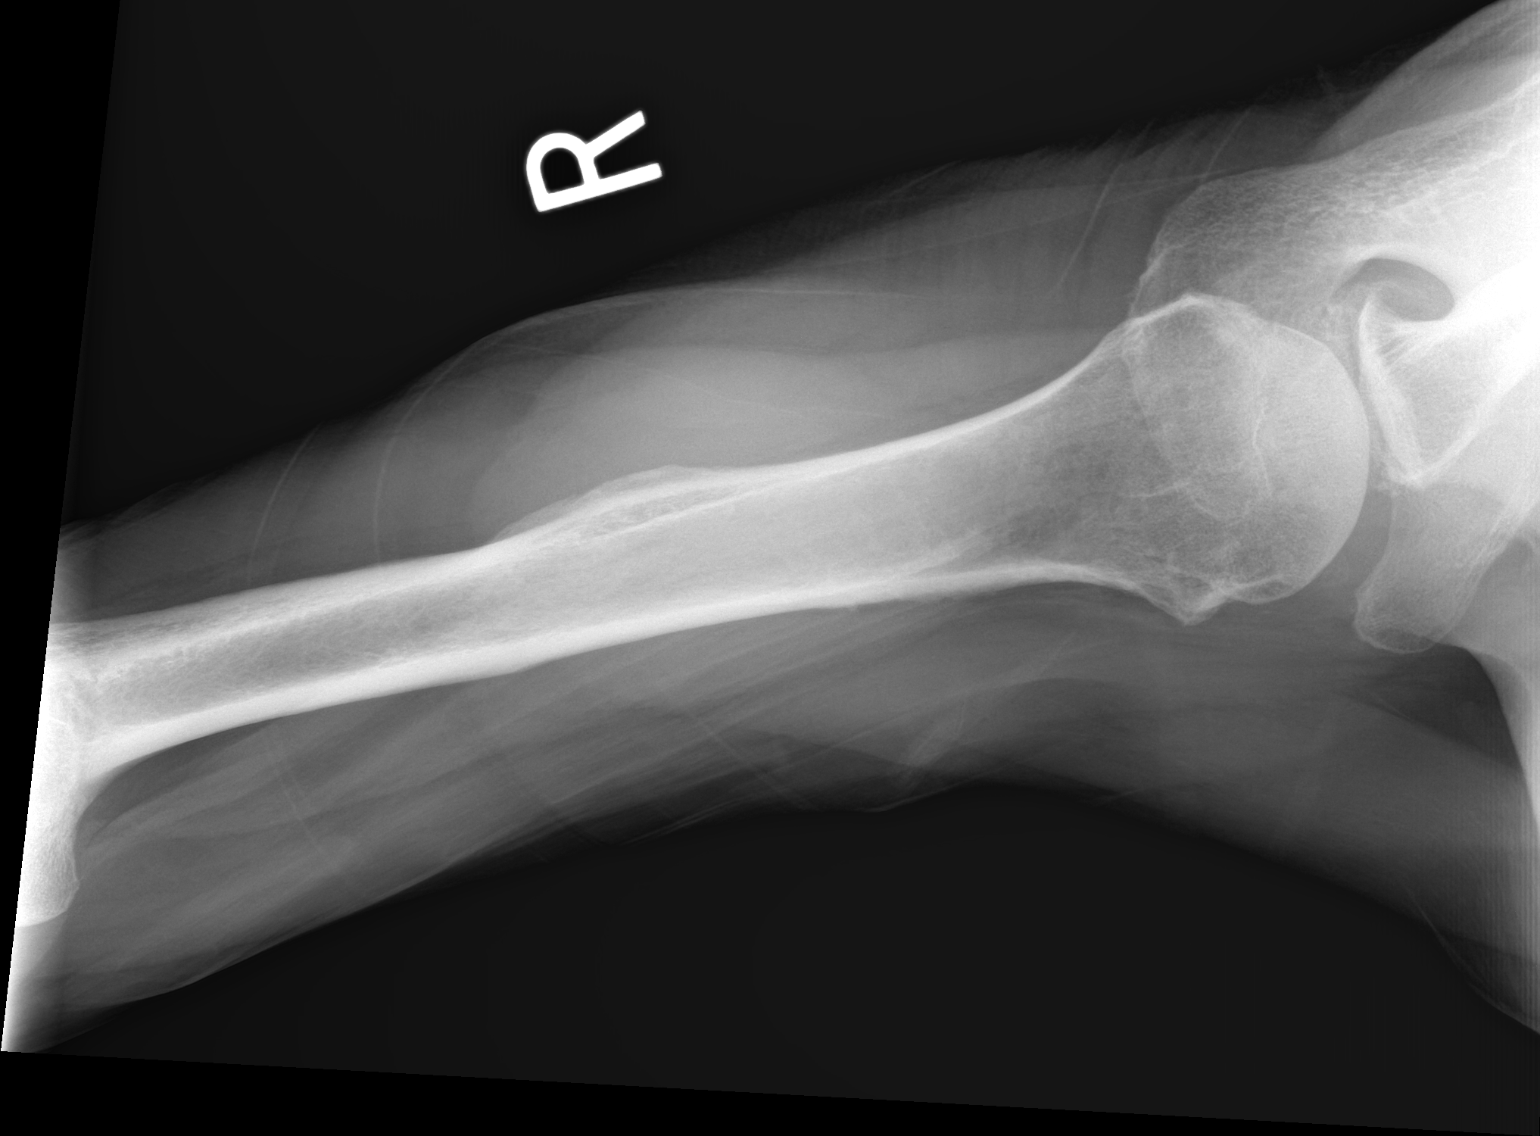

[shoulder ap]
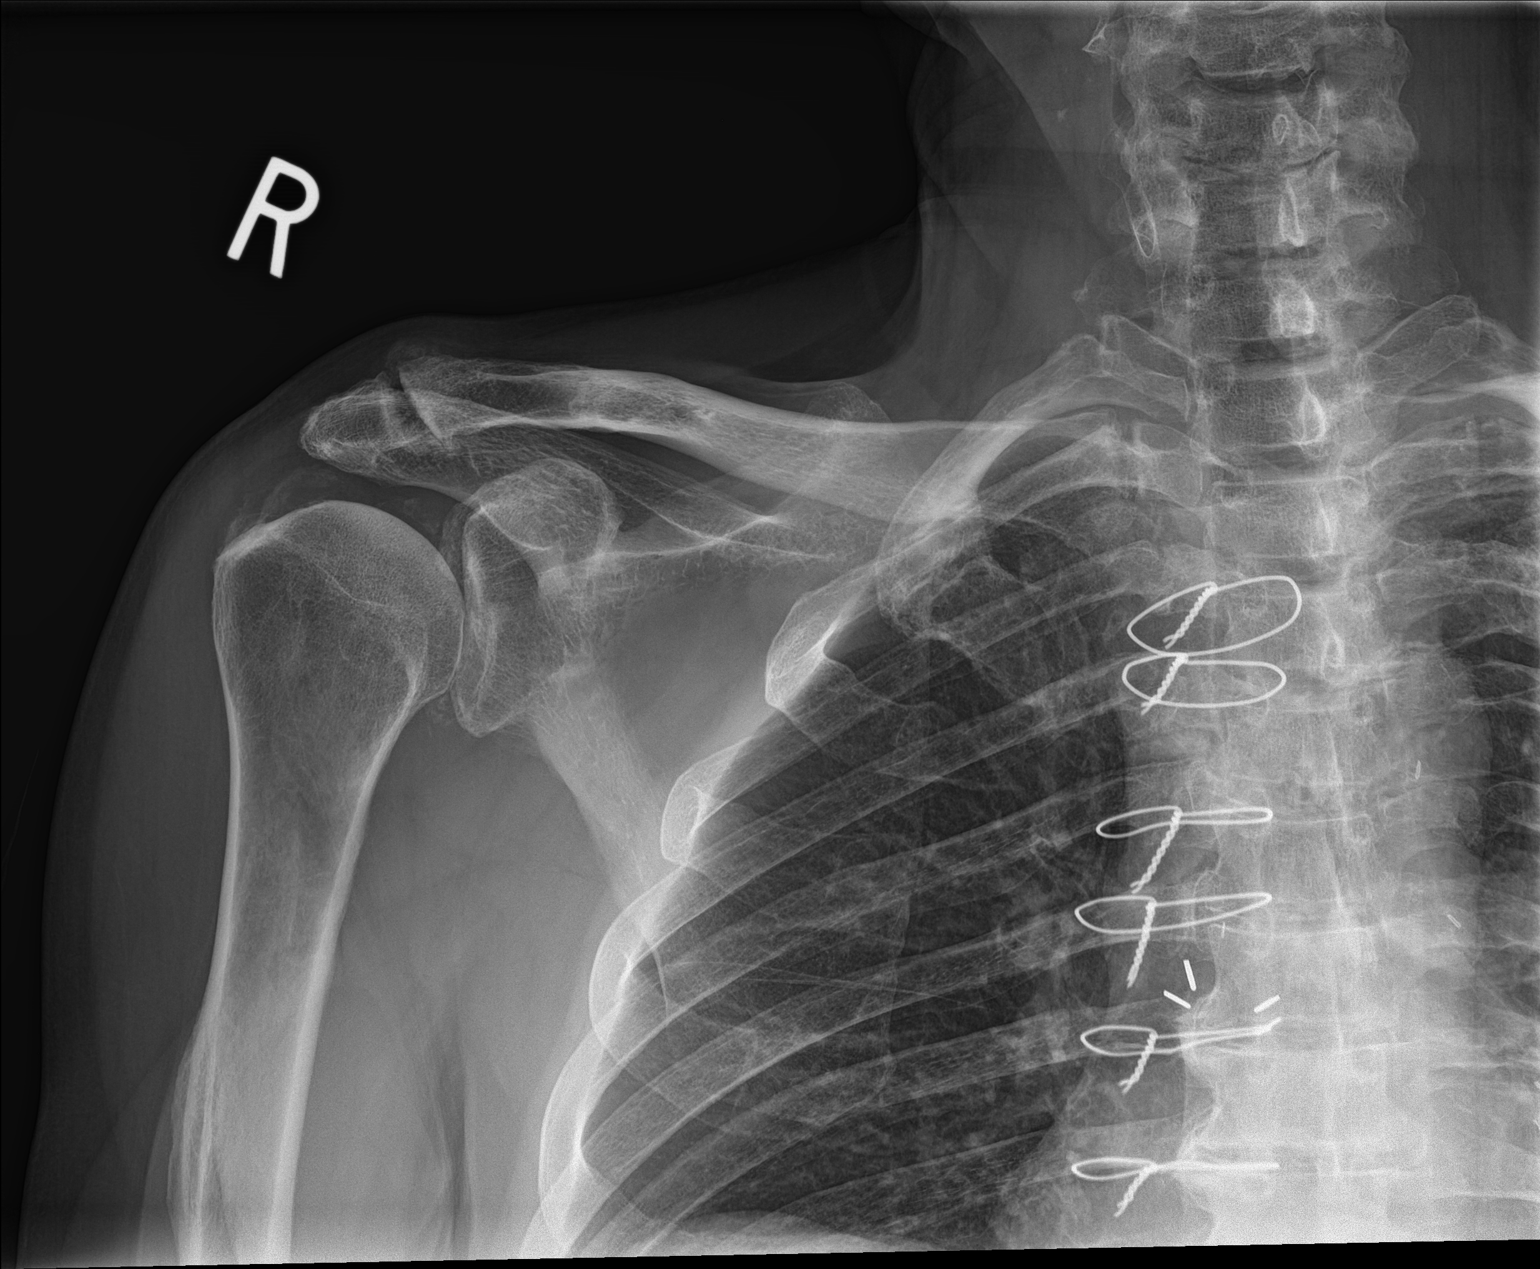

[shoulder y view]
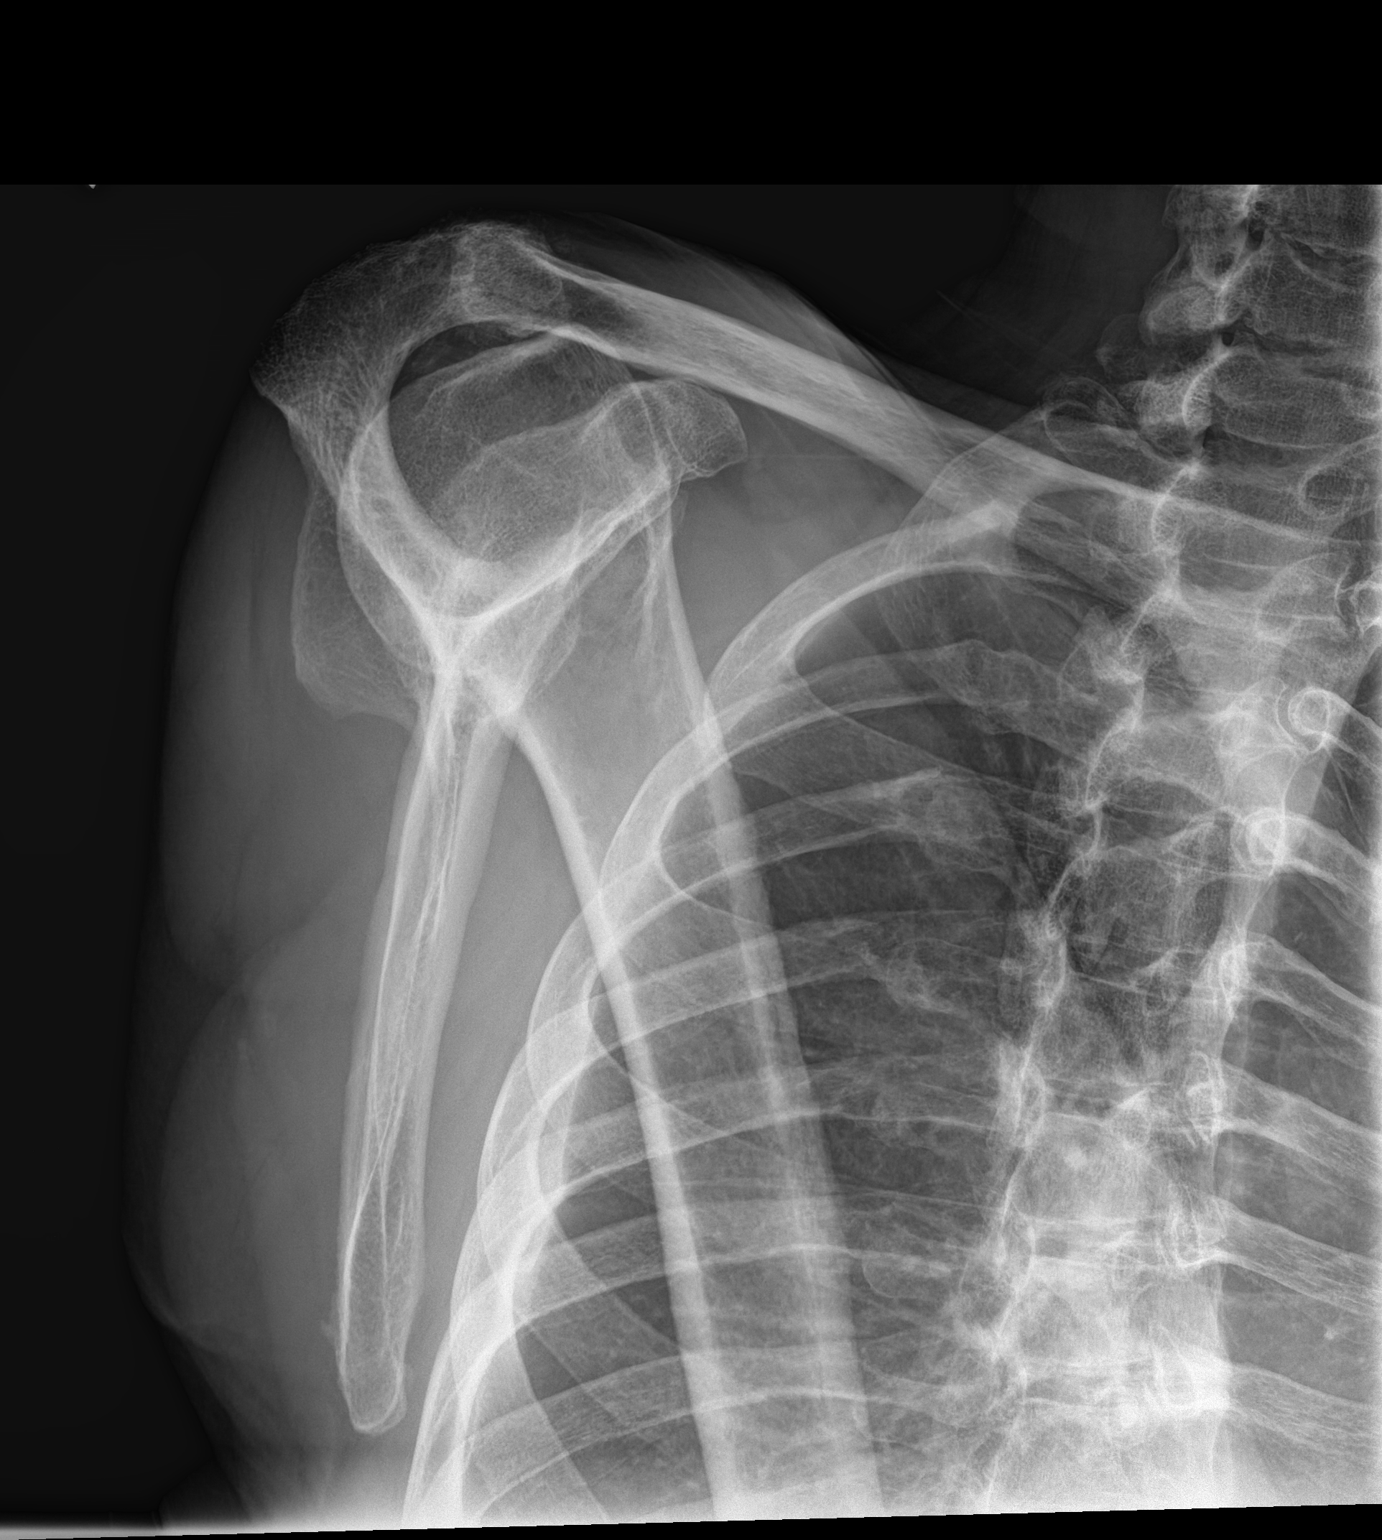

[3 of 3 positions shown; findings below may reference images not displayed]

FINDINGS: Chondrocalcinosis is identified in the right shoulder. There is
subacromial narrowing. No acute fracture or traumatic subluxation.
Lung apex is clear. Status post median sternotomy.
IMPRESSION: Chronic changes in the shoulder.

No evidence for acute  abnormality.

## 2016-01-23 DIAGNOSIS — M25562 Pain in left knee: Secondary | ICD-10-CM | POA: Diagnosis not present

## 2016-01-23 DIAGNOSIS — R2689 Other abnormalities of gait and mobility: Secondary | ICD-10-CM | POA: Diagnosis not present

## 2016-01-23 DIAGNOSIS — M25561 Pain in right knee: Secondary | ICD-10-CM | POA: Diagnosis not present

## 2016-01-23 DIAGNOSIS — M170 Bilateral primary osteoarthritis of knee: Secondary | ICD-10-CM | POA: Diagnosis not present

## 2016-01-27 DIAGNOSIS — R2689 Other abnormalities of gait and mobility: Secondary | ICD-10-CM | POA: Diagnosis not present

## 2016-01-27 DIAGNOSIS — M25561 Pain in right knee: Secondary | ICD-10-CM | POA: Diagnosis not present

## 2016-01-27 DIAGNOSIS — M25562 Pain in left knee: Secondary | ICD-10-CM | POA: Diagnosis not present

## 2016-01-27 DIAGNOSIS — M170 Bilateral primary osteoarthritis of knee: Secondary | ICD-10-CM | POA: Diagnosis not present

## 2016-01-29 DIAGNOSIS — M170 Bilateral primary osteoarthritis of knee: Secondary | ICD-10-CM | POA: Diagnosis not present

## 2016-01-29 DIAGNOSIS — R2689 Other abnormalities of gait and mobility: Secondary | ICD-10-CM | POA: Diagnosis not present

## 2016-01-29 DIAGNOSIS — M25561 Pain in right knee: Secondary | ICD-10-CM | POA: Diagnosis not present

## 2016-01-29 DIAGNOSIS — M25562 Pain in left knee: Secondary | ICD-10-CM | POA: Diagnosis not present

## 2016-02-03 DIAGNOSIS — R2689 Other abnormalities of gait and mobility: Secondary | ICD-10-CM | POA: Diagnosis not present

## 2016-02-03 DIAGNOSIS — M25562 Pain in left knee: Secondary | ICD-10-CM | POA: Diagnosis not present

## 2016-02-03 DIAGNOSIS — M25561 Pain in right knee: Secondary | ICD-10-CM | POA: Diagnosis not present

## 2016-02-03 DIAGNOSIS — M170 Bilateral primary osteoarthritis of knee: Secondary | ICD-10-CM | POA: Diagnosis not present

## 2016-02-05 DIAGNOSIS — R262 Difficulty in walking, not elsewhere classified: Secondary | ICD-10-CM | POA: Diagnosis not present

## 2016-02-05 DIAGNOSIS — M25561 Pain in right knee: Secondary | ICD-10-CM | POA: Diagnosis not present

## 2016-02-05 DIAGNOSIS — M170 Bilateral primary osteoarthritis of knee: Secondary | ICD-10-CM | POA: Diagnosis not present

## 2016-02-05 DIAGNOSIS — M25562 Pain in left knee: Secondary | ICD-10-CM | POA: Diagnosis not present

## 2016-02-18 DIAGNOSIS — S5011XA Contusion of right forearm, initial encounter: Secondary | ICD-10-CM | POA: Diagnosis not present

## 2016-03-16 DIAGNOSIS — L821 Other seborrheic keratosis: Secondary | ICD-10-CM | POA: Diagnosis not present

## 2016-03-16 DIAGNOSIS — D485 Neoplasm of uncertain behavior of skin: Secondary | ICD-10-CM | POA: Diagnosis not present

## 2016-03-16 DIAGNOSIS — L570 Actinic keratosis: Secondary | ICD-10-CM | POA: Diagnosis not present

## 2016-03-16 DIAGNOSIS — Z85828 Personal history of other malignant neoplasm of skin: Secondary | ICD-10-CM | POA: Diagnosis not present

## 2016-03-16 DIAGNOSIS — L814 Other melanin hyperpigmentation: Secondary | ICD-10-CM | POA: Diagnosis not present

## 2016-03-16 DIAGNOSIS — C44329 Squamous cell carcinoma of skin of other parts of face: Secondary | ICD-10-CM | POA: Diagnosis not present

## 2016-05-05 ENCOUNTER — Other Ambulatory Visit: Payer: Self-pay | Admitting: *Deleted

## 2016-05-05 MED ORDER — BUPROPION HCL ER (XL) 300 MG PO TB24: ORAL_TABLET | ORAL | 2 refills | Status: DC

## 2016-05-06 DIAGNOSIS — C44329 Squamous cell carcinoma of skin of other parts of face: Secondary | ICD-10-CM | POA: Diagnosis not present

## 2016-05-06 DIAGNOSIS — L905 Scar conditions and fibrosis of skin: Secondary | ICD-10-CM | POA: Diagnosis not present

## 2016-05-06 DIAGNOSIS — L570 Actinic keratosis: Secondary | ICD-10-CM | POA: Diagnosis not present

## 2016-05-13 DIAGNOSIS — M25562 Pain in left knee: Secondary | ICD-10-CM | POA: Diagnosis not present

## 2016-05-13 DIAGNOSIS — R262 Difficulty in walking, not elsewhere classified: Secondary | ICD-10-CM | POA: Diagnosis not present

## 2016-05-13 DIAGNOSIS — M25561 Pain in right knee: Secondary | ICD-10-CM | POA: Diagnosis not present

## 2016-05-13 DIAGNOSIS — M170 Bilateral primary osteoarthritis of knee: Secondary | ICD-10-CM | POA: Diagnosis not present

## 2016-05-13 DIAGNOSIS — M1711 Unilateral primary osteoarthritis, right knee: Secondary | ICD-10-CM | POA: Diagnosis not present

## 2016-05-14 ENCOUNTER — Ambulatory Visit (INDEPENDENT_AMBULATORY_CARE_PROVIDER_SITE_OTHER): Payer: Medicare Other | Admitting: Internal Medicine

## 2016-05-14 ENCOUNTER — Encounter: Payer: Self-pay | Admitting: Internal Medicine

## 2016-05-14 VITALS — BP 124/62 | HR 68 | Temp 97.5°F | Resp 16 | Ht 66.5 in | Wt 175.6 lb

## 2016-05-14 DIAGNOSIS — Z79899 Other long term (current) drug therapy: Secondary | ICD-10-CM

## 2016-05-14 DIAGNOSIS — E782 Mixed hyperlipidemia: Secondary | ICD-10-CM

## 2016-05-14 DIAGNOSIS — Z23 Encounter for immunization: Secondary | ICD-10-CM | POA: Diagnosis not present

## 2016-05-14 DIAGNOSIS — E559 Vitamin D deficiency, unspecified: Secondary | ICD-10-CM

## 2016-05-14 DIAGNOSIS — I10 Essential (primary) hypertension: Secondary | ICD-10-CM

## 2016-05-14 DIAGNOSIS — R7309 Other abnormal glucose: Secondary | ICD-10-CM | POA: Diagnosis not present

## 2016-05-14 DIAGNOSIS — R7303 Prediabetes: Secondary | ICD-10-CM | POA: Diagnosis not present

## 2016-05-14 DIAGNOSIS — I2510 Atherosclerotic heart disease of native coronary artery without angina pectoris: Secondary | ICD-10-CM

## 2016-05-14 LAB — CBC WITH DIFFERENTIAL/PLATELET
Basophils Absolute: 0 cells/uL (ref 0–200)
Basophils Relative: 0 %
Eosinophils Absolute: 0 cells/uL — ABNORMAL LOW (ref 15–500)
Eosinophils Relative: 0 %
HCT: 47.1 % (ref 38.5–50.0)
Hemoglobin: 16.3 g/dL (ref 13.2–17.1)
Lymphocytes Relative: 9 %
Lymphs Abs: 1647 cells/uL (ref 850–3900)
MCH: 31.9 pg (ref 27.0–33.0)
MCHC: 34.6 g/dL (ref 32.0–36.0)
MCV: 92.2 fL (ref 80.0–100.0)
MPV: 10.2 fL (ref 7.5–12.5)
Monocytes Absolute: 549 cells/uL (ref 200–950)
Monocytes Relative: 3 %
Neutro Abs: 16104 cells/uL — ABNORMAL HIGH (ref 1500–7800)
Neutrophils Relative %: 88 %
Platelets: 214 10*3/uL (ref 140–400)
RBC: 5.11 MIL/uL (ref 4.20–5.80)
RDW: 14 % (ref 11.0–15.0)
WBC: 18.3 10*3/uL — ABNORMAL HIGH (ref 3.8–10.8)

## 2016-05-14 LAB — LIPID PANEL
Cholesterol: 185 mg/dL (ref 125–200)
HDL: 54 mg/dL (ref >=40)
LDL Cholesterol: 119 mg/dL (ref <130)
Total CHOL/HDL Ratio: 3.4 Ratio (ref <=5.0)
Triglycerides: 60 mg/dL (ref <150)
VLDL: 12 mg/dL (ref <30)

## 2016-05-14 LAB — HEPATIC FUNCTION PANEL
ALT: 24 U/L (ref 9–46)
AST: 23 U/L (ref 10–35)
Albumin: 4.4 g/dL (ref 3.6–5.1)
Alkaline Phosphatase: 57 U/L (ref 40–115)
Bilirubin, Direct: 0.2 mg/dL (ref <=0.2)
Indirect Bilirubin: 0.5 mg/dL (ref 0.2–1.2)
Total Bilirubin: 0.7 mg/dL (ref 0.2–1.2)
Total Protein: 7.1 g/dL (ref 6.1–8.1)

## 2016-05-14 LAB — BASIC METABOLIC PANEL WITH GFR
BUN: 21 mg/dL (ref 7–25)
CO2: 25 mmol/L (ref 20–31)
Calcium: 9.5 mg/dL (ref 8.6–10.3)
Chloride: 102 mmol/L (ref 98–110)
Creat: 1.05 mg/dL (ref 0.70–1.11)
GFR, Est African American: 77 mL/min (ref >=60)
GFR, Est Non African American: 66 mL/min (ref >=60)
Glucose, Bld: 119 mg/dL — ABNORMAL HIGH (ref 65–99)
Potassium: 4.5 mmol/L (ref 3.5–5.3)
Sodium: 138 mmol/L (ref 135–146)

## 2016-05-14 LAB — TSH: TSH: 0.29 mIU/L — ABNORMAL LOW (ref 0.40–4.50)

## 2016-05-14 LAB — HEMOGLOBIN A1C
Hgb A1c MFr Bld: 5.6 % (ref <5.7)
Mean Plasma Glucose: 114 mg/dL

## 2016-05-14 LAB — MAGNESIUM: Magnesium: 1.9 mg/dL (ref 1.5–2.5)

## 2016-05-14 NOTE — Patient Instructions (Signed)

## 2016-05-14 NOTE — Progress Notes (Signed)
[facility]ULT [facility] Paul Frazier, M.D.        Paul Frazier. Paul Frazier, Paul Frazier       Paul Frazier, Paul Frazier  [name]                [address] [place], [place] [phone] Telephone [phone] Telefax [phone] ______________________________________________________________________     This very nice 82 y.o. MWM presents for 6 month follow up with Hypertension, Hyperlipidemia, Pre-Diabetes and Vitamin D Deficiency. Patient also is followed by Dr Alinda Money for an elevated PSA.      Patient is treated for HTN (1980's) & BP has been controlled at home. Today's BP is 124/62.  In 1986, he underwent CABG and in 2001 had a Stent placed and in 2012 had a negative Heart Cath. Patient has had no complaints of any cardiac type chest pain, palpitations, dyspnea/orthopnea/PND, dizziness, claudication, or dependent edema.     Hyperlipidemia is controlled with diet & meds. Patient denies myalgias or other med SE's. Last Lipids were at goal: Lab Results  Component Value Date   CHOL 163 12/30/2015   HDL 43 12/30/2015   LDLCALC 88 12/30/2015   TRIG 159 (H) 12/30/2015   CHOLHDL 3.8 12/30/2015      Also, the patient has history of PreDiabetes circa 2013 with A1c 5.8% and has had no symptoms of reactive hypoglycemia, diabetic polys, paresthesias or visual blurring.  Last A1c was not at goal: Lab Results  Component Value Date   HGBA1C 6.1 (H) 12/30/2015      Further, the patient also has history of Vitamin D Deficiency in 2011 with A1c 5.8% and supplements vitamin D without any suspected side-effects. Last vitamin D was at goal: Lab Results  Component Value Date   VD25OH 62 11/07/2015   Current Outpatient Prescriptions on File Prior to Visit  Medication Sig  . aspirin 81 MG tablet Take 81 mg by mouth daily.  [name] buPROPion (WELLBUTRIN XL) 300 MG 24 hr tablet TAKE ONE TABLET BY MOUTH ONCE DAILY FOR  MOOD  . co-enzyme Q-10 30  MG capsule Take 60 mg by mouth daily.  . diazepam (VALIUM) 5 MG tablet TAKE 1/2-1 TAB THREE TIMES A DAY AS NEEDED FOR MUSCLE SPASM  . Digestive Enzymes CAPS Take 1-2 capsules by mouth as needed (digestive function).   . Magnesium Chloride (MAGNESIUM DR PO) Take 1 tablet by mouth daily.   . metoprolol succinate (TOPROL-XL) 25 MG 24 hr tablet TAKE ONE TABLET BY MOUTH ONCE DAILY  . nitroGLYCERIN (NITROSTAT) 0.4 MG SL tablet Place 1 tablet (0.4 mg total) under the tongue every 5 (five) minutes as needed for chest pain.  [name] Specialty Vitamins Products (PROSTATE) TABS Take 2 tablets by mouth daily.    No current facility-administered medications on file prior to visit.    Allergies  Allergen Reactions  . Erythromycin Nausea Only   PMHx:   Past Medical History:  Diagnosis Date  . Arthritis   . CAD (coronary artery disease)   . Hyperlipidemia   . Hypertension   . Old myocardial infarct   . S/P CABG x 5 08/29/1984   LIMA to LAD,SVG to diagonal,SVG to OM,SVG to PDA and PLA  . Tremor, essential    Immunization History  Administered Date(s) Administered  . Influenza, High Dose Seasonal PF 05/14/2016  . Pneumococcal Polysaccharide-23 10/16/2014  . Td 07/20/2011   Past Surgical  History:  Procedure Laterality Date  . CHOLECYSTECTOMY  10/1990  . CORONARY ARTERY BYPASS GRAFT  1986   x5 LIMA to LAD,SVG to diagonal,SVG to OM,SVG to PDA and PLA  . CORONARY STENT PLACEMENT    . INGUINAL HERNIA REPAIR  04/25/2012   right- laparoscopic  . R/P Myoview  12/27/2008   no ischemia  . US ECHOCARDIOGRAPHY  12/27/2008   concentric LVH,mild to mod MR, TR and pulmonary hypertension. EF 45-50%   FHx:    Reviewed / unchanged  SHx:    Reviewed / unchanged  Systems Review:  Constitutional: Denies fever, chills, wt changes, headaches, insomnia, fatigue, night sweats, change in appetite. Eyes: Denies redness, blurred vision, diplopia, discharge, itchy, watery eyes.  ENT: Denies discharge, congestion, post  nasal drip, epistaxis, sore throat, earache, hearing loss, dental pain, tinnitus, vertigo, sinus pain, snoring.  CV: Denies chest pain, palpitations, irregular heartbeat, syncope, dyspnea, diaphoresis, orthopnea, PND, claudication or edema. Respiratory: denies cough, dyspnea, DOE, pleurisy, hoarseness, laryngitis, wheezing.  Gastrointestinal: Denies dysphagia, odynophagia, heartburn, reflux, water brash, abdominal pain or cramps, nausea, vomiting, bloating, diarrhea, constipation, hematemesis, melena, hematochezia  or hemorrhoids. Genitourinary: Denies dysuria, frequency, urgency, nocturia, hesitancy, discharge, hematuria or flank pain. Musculoskeletal: Denies arthralgias, myalgias, stiffness, jt. swelling, pain, limping or strain/sprain.  Skin: Denies pruritus, rash, hives, warts, acne, eczema or change in skin lesion(s). Neuro: No weakness, tremor, incoordination, spasms, paresthesia or pain. Psychiatric: Denies confusion, memory loss or sensory loss. Endo: Denies change in weight, skin or hair change.  Heme/Lymph: No excessive bleeding, bruising or enlarged lymph nodes.  Physical Exam BP 124/62   Pulse 68   Temp 97.5 F (36.4 C)   Resp 16   Ht 5' 6.5" (1.689 m)   Wt 175 lb 9.6 oz (79.7 kg)   BMI 27.92 kg/m   Appears well nourished and in no distress.  Eyes: PERRLA, EOMs, conjunctiva no swelling or erythema. Sinuses: No frontal/maxillary tenderness ENT/Mouth: EAC's clear, TM's nl w/o erythema, bulging. Nares clear w/o erythema, swelling, exudates. Oropharynx clear without erythema or exudates. Oral hygiene is good. Tongue normal, non obstructing. Hearing intact.  Neck: Supple. Thyroid nl. Car 2+/2+ without bruits, nodes or JVD. Chest: Respirations nl with BS clear & equal w/o rales, rhonchi, wheezing or stridor.  Cor: Heart sounds normal w/ regular rate and rhythm without sig. murmurs, gallops, clicks, or rubs. Peripheral pulses normal and equal  without edema.  Abdomen: Soft & bowel  sounds normal. Non-tender w/o guarding, rebound, hernias, masses, or organomegaly.  Lymphatics: Unremarkable.  Musculoskeletal: Full ROM all peripheral extremities, joint stability, 5/5 strength, and normal gait.  Skin: Warm, dry without exposed rashes, lesions or ecchymosis apparent.  Neuro: Cranial nerves intact, reflexes equal bilaterally. Sensory-motor testing grossly intact. Tendon reflexes grossly intact.  Pysch: Alert & oriented x 3.  Insight and judgement nl & appropriate. No ideations.  Assessment and Plan:   1. Essential hypertension  - Continue medication, monitor blood pressure at home. Continue DASH diet.  - Reminder to go to the ER if any CP, SOB, nausea, dizziness, severe HA, changes vision/speech, left arm numbness and tingling and jaw pain. - TSH  2. Hyperlipidemia  - Continue diet/meds, exercise,& lifestyle modifications.  - Continue monitor periodic cholesterol/liver & renal functions  - Lipid panel - TSH  3. Prediabetes  - Continue diet, exercise, lifestyle modifications. Monitor appropriate labs. - Hemoglobin A1c - Insulin, random  4. Vitamin D deficiency  - Continue supplementation. - VITAMIN D 25 Hydroxy   5. Other abnormal  glucose  6. Coronary artery disease involving native coronary artery of native heart without angina pectoris   7. Medication management  - CBC with Differential/Platelet - BASIC METABOLIC PANEL WITH GFR - Hepatic function panel - Magnesium  8. Need for prophylactic vaccination and inoculation against influenza  - Flu vaccine HIGH DOSE PF (Fluzone High dose)       Recommended regular exercise, BP monitoring, weight control, and discussed med and SE's. Recommended labs to assess and monitor clinical status. Further disposition pending results of labs. Over 30 minutes of exam, counseling, chart review was performed

## 2016-05-15 ENCOUNTER — Other Ambulatory Visit: Payer: Self-pay | Admitting: Internal Medicine

## 2016-05-15 DIAGNOSIS — R7989 Other specified abnormal findings of blood chemistry: Secondary | ICD-10-CM

## 2016-05-15 LAB — INSULIN, RANDOM: Insulin: 19.1 u[IU]/mL (ref 2.0–19.6)

## 2016-05-15 LAB — VITAMIN D 25 HYDROXY (VIT D DEFICIENCY, FRACTURES): Vit D, 25-Hydroxy: 73 ng/mL (ref 30–100)

## 2016-05-17 ENCOUNTER — Encounter: Payer: Self-pay | Admitting: *Deleted

## 2016-05-18 ENCOUNTER — Ambulatory Visit (INDEPENDENT_AMBULATORY_CARE_PROVIDER_SITE_OTHER): Payer: Medicare Other | Admitting: *Deleted

## 2016-05-18 DIAGNOSIS — R899 Unspecified abnormal finding in specimens from other organs, systems and tissues: Secondary | ICD-10-CM | POA: Diagnosis not present

## 2016-05-18 DIAGNOSIS — R7989 Other specified abnormal findings of blood chemistry: Secondary | ICD-10-CM

## 2016-05-18 LAB — CBC WITH DIFFERENTIAL/PLATELET
Basophils Absolute: 0 cells/uL (ref 0–200)
Basophils Relative: 0 %
Eosinophils Absolute: 294 cells/uL (ref 15–500)
Eosinophils Relative: 3 %
HCT: 49.8 % (ref 38.5–50.0)
Hemoglobin: 16.9 g/dL (ref 13.2–17.1)
Lymphocytes Relative: 33 %
Lymphs Abs: 3234 cells/uL (ref 850–3900)
MCH: 31.2 pg (ref 27.0–33.0)
MCHC: 33.9 g/dL (ref 32.0–36.0)
MCV: 91.9 fL (ref 80.0–100.0)
MPV: 10.4 fL (ref 7.5–12.5)
Monocytes Absolute: 1078 cells/uL — ABNORMAL HIGH (ref 200–950)
Monocytes Relative: 11 %
Neutro Abs: 5194 cells/uL (ref 1500–7800)
Neutrophils Relative %: 53 %
Platelets: 211 10*3/uL (ref 140–400)
RBC: 5.42 MIL/uL (ref 4.20–5.80)
RDW: 14.4 % (ref 11.0–15.0)
WBC: 9.8 10*3/uL (ref 3.8–10.8)

## 2016-05-19 ENCOUNTER — Encounter: Payer: Self-pay | Admitting: *Deleted

## 2016-06-16 ENCOUNTER — Telehealth: Payer: Self-pay | Admitting: Neurology

## 2016-06-16 NOTE — Telephone Encounter (Signed)
Patient reports new symptoms of toes numb, legs weak, occassionally will feel like he is going to fall. An appointment was made for 08/17/16. Advised that the nurse would call if there was any other questions.

## 2016-06-21 DIAGNOSIS — R42 Dizziness and giddiness: Secondary | ICD-10-CM | POA: Diagnosis not present

## 2016-06-21 DIAGNOSIS — H6123 Impacted cerumen, bilateral: Secondary | ICD-10-CM | POA: Diagnosis not present

## 2016-07-05 ENCOUNTER — Other Ambulatory Visit: Payer: Self-pay | Admitting: Internal Medicine

## 2016-07-05 DIAGNOSIS — M62838 Other muscle spasm: Secondary | ICD-10-CM

## 2016-07-05 MED ORDER — DIAZEPAM 5 MG PO TABS: ORAL_TABLET | ORAL | 0 refills | Status: DC

## 2016-07-21 ENCOUNTER — Other Ambulatory Visit: Payer: Self-pay | Admitting: Internal Medicine

## 2016-07-21 DIAGNOSIS — I10 Essential (primary) hypertension: Secondary | ICD-10-CM

## 2016-07-21 MED ORDER — METOPROLOL SUCCINATE ER 25 MG PO TB24: 25.0000 mg | ORAL_TABLET | Freq: Every day | ORAL | 1 refills | Status: DC

## 2016-08-16 DIAGNOSIS — M25561 Pain in right knee: Secondary | ICD-10-CM | POA: Diagnosis not present

## 2016-08-16 DIAGNOSIS — M170 Bilateral primary osteoarthritis of knee: Secondary | ICD-10-CM | POA: Diagnosis not present

## 2016-08-16 DIAGNOSIS — M1712 Unilateral primary osteoarthritis, left knee: Secondary | ICD-10-CM | POA: Diagnosis not present

## 2016-08-16 DIAGNOSIS — M25562 Pain in left knee: Secondary | ICD-10-CM | POA: Diagnosis not present

## 2016-08-17 ENCOUNTER — Telehealth: Payer: Self-pay

## 2016-08-17 ENCOUNTER — Ambulatory Visit: Payer: Medicare Other | Admitting: Neurology

## 2016-08-17 NOTE — Telephone Encounter (Signed)
Pt was placed on Dr. Edwena Felty schedule in error. Pt needs to be placed on Dr. Guadelupe Sabin schedule.   I called pt to discuss. No answer, left a message asking him to call me back.

## 2016-08-17 NOTE — Telephone Encounter (Signed)
I called pt. He is agreeable to a 08/19/16 appt with Dr. Rexene Alberts at 4:00pm. Pt verbalized understanding to arriv eat 3:45pm. Pt verbalized understanding of new appt date and time.

## 2016-08-17 NOTE — Telephone Encounter (Signed)
I will keep an eye out for an cancellations.

## 2016-08-17 NOTE — Telephone Encounter (Signed)
Paul Frazier was able to get the patient in this week

## 2016-08-17 NOTE — Telephone Encounter (Signed)
I spoke to Dr. Rexene Alberts. She is agreeable to seeing pt in a work-in spot this week at 8:00am.  I called pt. He cannot make 8:00am appointments because they are too early. He would like a call to discuss when we can get him in sooner than the next available office visit.

## 2016-08-19 ENCOUNTER — Encounter: Payer: Self-pay | Admitting: Neurology

## 2016-08-19 ENCOUNTER — Ambulatory Visit (INDEPENDENT_AMBULATORY_CARE_PROVIDER_SITE_OTHER): Payer: Medicare Other | Admitting: Neurology

## 2016-08-19 VITALS — BP 118/56 | HR 62 | Resp 16 | Ht 66.5 in | Wt 173.0 lb

## 2016-08-19 DIAGNOSIS — G250 Essential tremor: Secondary | ICD-10-CM

## 2016-08-19 DIAGNOSIS — G629 Polyneuropathy, unspecified: Secondary | ICD-10-CM | POA: Diagnosis not present

## 2016-08-19 DIAGNOSIS — Z9181 History of falling: Secondary | ICD-10-CM | POA: Diagnosis not present

## 2016-08-19 DIAGNOSIS — R2689 Other abnormalities of gait and mobility: Secondary | ICD-10-CM

## 2016-08-19 NOTE — Patient Instructions (Addendum)
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   Remember to drink plenty of fluid, eat healthy meals and do not skip any meals. Try to eat protein with a every meal and eat a healthy snack such as fruit or nuts in between meals. Try to keep a regular sleep-wake schedule and try to exercise daily, particularly in the form of walking, 20-30 minutes a day, if you can. Change positions slowly and you should start using a cane.   As far as your medications are concerned, I would like to suggest no new medications.  As far as diagnostic testing: We will check blood work in AM and call you with the test results. We will do an EMG and nerve conduction velocity test, which is an electrical nerve and muscle test, which we will schedule. We will call you with the results.

## 2016-08-19 NOTE — Progress Notes (Signed)
Subjective:    Patient ID: Paul Frazier is a 83 y.o. male.  HPI     HPI:   Paul Frazier is an 89 -year-old right-handed gentleman who presents for a new problem visit of numbness and balance problem, near falls. He has an underlying medical history of vitamin D deficiency, hypertension, heart disease, status post CABG and stent placement, prediabetes, hyperlipidemia, and elevated PSA. The patient is unaccompanied today. I have previously seen him for his essential tremor. I last saw him on 06/04/2014, at which time he felt stable with his tremor, had some intermittent numbness in his right foot and right toes. He had heel pain on the left side. He was wearing inserts in both shoes. He had no significant vertigo or dizziness. He reported some low back pain.  Today, 08/19/2016 (all dictated new, as well as above notes, some dictation done in note pad or Word, outside of chart, may appear as copied):   He reports having had 2 falls in the past 6 months, c/o toe numbness, both feet. Fell at [employer] and has had injections in the knees, which are helpful, but fell about 1 month and had laceration to R arm/elbow. Fell in the bathroom, but no serious head injury or other bony injury, both times, feels like the R ankle gets numb and foot gives out. Tremor in hands is worse, stress worse, particularly with his wife's health. He does not drink enough water. He drinks about 1 8 ounce glass of water per day he estimates. He drinks 2 cups of coffee in the morning and occasional juice or tea.  He had a head CT without contrast on 05/13/2015 which I reviewed: Interval resolution of left frontal subdural hematoma, no acute findings, mild atrophy and mild chronic small vessel ischemic changes. Of note, he has a history of fall in 2013 and known history of subdural hematoma.  The patient's allergies, current medications, family history, past medical history, past social history, past surgical history and problem  list were reviewed and updated as appropriate.   Previously (copied from previous notes for reference):   I saw him on 11/13/2013, at which time he reported that the tremor was fluctuating. He had experienced intermittent vertigo. I did not suggest any medication changes for fear of causing his pulse to drop with increase in beta blocker and his balance to get worse with a trial of primidone.   I saw him on 09/14/2012, at which time I kept him on low-dose metoprolol as he already had a low heart rate and I did not start him on primidone for fear of exacerbating his dizziness and balance problems and sedation, given his advanced age.  I first met him on 08/24/2012, which time he reported that he fell in December and hit his head. He was already on low-dose beta blocker because of his heart and I did not suggest that he increase it because his heart rate was already in the 50s. We talked about adding another medication down the Road but I did not suggest that we start him just yet. I did suggest a head CT based on his history of fall. He had a head CT on 09/06/2012 which showed a left frontal chronic subdural hematoma with mild midline shift but because he had moderate frontal atrophy there was not a whole other midline shift. I suggested that we repeat his head CT and we called him with his test results. He had a repeat head CT on 10/05/2012: Chronic left frontal  subdural hematoma. Mild mass effect upon underlying frontal lobe. Minimal 2 mm of left to right midline shift. Compared to the prior CT from 09/06/12, there is slight reduction in size of the subdural hematoma.  He and his wife noted that his tremors have become worse since he had a heart attack last year. He has a prior history of coronary artery bypass surgery in the 80s and also had a stent placed in 2001. He has not noticed much in the way of difficulty with his posture. He does notice sometimes that his balance is not very good. Of note he has  fallen recently before Christmas last year. He actually tripped over a piece of wire that was outside and landed on the ground which was cold and frozen at the time. He fell and hit his for head. He has not had a head CT. He reports no residual headaches are one-sided weakness, numbness, slurring of speech or droopy face. Nevertheless at the time he had a severe headache. He's had degenerative spine disease and has had epidural injections to his neck. He's had some numbness and tingling radiating to his left index finger. He has seen a neurosurgeon who suggested surgery but left it up to him to decide. He has had epidural injection had his last injection in February 2012.  He has had some lightheadedness, and recently was told to stop his Prinivil. He has not fallen, but has had some close calls. He denies vertigo. Never had TIA or stroke symptoms, denying sudden onset of one sided weakness, numbness, tingling, slurring of speech or droopy face, hearing loss, tinnitus, diplopia or visual field cut or monocular loss of vision, and denies recurrent headaches.  His tremor affects both hands but is worse on the left. It is primarily a action tremor and his handwriting is impaired. 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 He was very active in his work and had to stop suddenly because of his heart attack. He felt even if he had not stop working because of his heart he would've stopped because of his trembling. He has not noticed a lower extremity tremor. He does not report any stiffness or drooling. He does not report any memory difficulties or other cognitive impairment. He tries to walk regularly and his wife has not noticed any changes in his gait.  He had a brother who passed away at age 51, from cancer in his lymph nodes and he had PD. He recalls no other FHx of tremors. He fell off a ladder in 2009 and had surgery.   His Past Medical History Is Significant For: Past Medical History:  Diagnosis Date   . Arthritis   . CAD (coronary artery disease)   . Hyperlipidemia   . Hypertension   . Old myocardial infarct   . S/P CABG x 5 08/29/1984   LIMA to LAD,SVG to diagonal,SVG to OM,SVG to PDA and PLA  . Tremor, essential     His Past Surgical History Is Significant For: Past Surgical History:  Procedure Laterality Date  . CHOLECYSTECTOMY  10/1990  . CORONARY ARTERY BYPASS GRAFT  1986   x5 LIMA to LAD,SVG to diagonal,SVG to OM,SVG to PDA and PLA  . CORONARY STENT PLACEMENT    . INGUINAL HERNIA REPAIR  04/25/2012   right- laparoscopic  . R/P Myoview  12/27/2008   no ischemia  . US ECHOCARDIOGRAPHY  12/27/2008   concentric LVH,mild to mod MR, TR and pulmonary hypertension. EF 45-50%  His Family History Is Significant For: Family History  Problem Relation Age of Onset  . Cancer Father     bone  . Cancer Brother     lymphnode    His Social History Is Significant For: Social History   Social History  . Marital status: Married    Spouse name: [name]  . Number of children: 3  . Years of education: 14   Occupational History  . Retired      retired   Social History Main Topics  . Smoking status: Former Smoker    Quit date: 07/19/1970  . Smokeless tobacco: Never Used  . Alcohol use No  . Drug use: No  . Sexual activity: Not Asked   Other Topics Concern  . None   Social History Narrative   Drinks 1-2 cups of coffee a day, occasional coke     His Allergies Are:  Allergies  Allergen Reactions  . Erythromycin Nausea Only  :   His Current Medications Are:  Outpatient Encounter Prescriptions as of 08/19/2016  Medication Sig  . aspirin 81 MG tablet Take 81 mg by mouth daily.  [name] buPROPion (WELLBUTRIN XL) 300 MG 24 hr tablet TAKE ONE TABLET BY MOUTH ONCE DAILY FOR  MOOD  . co-enzyme Q-10 30 MG capsule Take 60 mg by mouth daily.  . diazepam (VALIUM) 5 MG tablet TAKE 1/2-1 TAB THREE TIMES A DAY AS NEEDED FOR MUSCLE SPASM  . Digestive Enzymes CAPS Take 1-2 capsules by mouth  as needed (digestive function).   . Magnesium Chloride (MAGNESIUM DR PO) Take 1 tablet by mouth daily.   . metoprolol succinate (TOPROL-XL) 25 MG 24 hr tablet Take 1 tablet (25 mg total) by mouth daily.  . nitroGLYCERIN (NITROSTAT) 0.4 MG SL tablet Place 1 tablet (0.4 mg total) under the tongue every 5 (five) minutes as needed for chest pain.  [name] Specialty Vitamins Products (PROSTATE) TABS Take 2 tablets by mouth daily.    No facility-administered encounter medications on file as of 08/19/2016.   :  Review of Systems:  Out of a complete 14 point review of systems, all are reviewed and negative with the exception of these symptoms as listed below:  Review of Systems  Neurological:       Patient reports numbness in his toes and R hand, it has been going on for about a year.       Objective:  Neurologic Exam  Physical Exam Physical Examination:   Vitals:   08/19/16 1639  BP: (!) 118/56  Pulse: 62  Resp: 16   General Examination: The patient is a very pleasant 83 y.o. male in no acute distress. He appears well-developed and well-nourished and well groomed.   HEENT: Normocephalic, atraumatic, pupils are equal, round and reactive to light and accommodation. Funduscopic exam is normal with sharp disc margins noted, Status post cataract repairs. Extraocular tracking is fairly good, no nystagmus is noted. Hearing is fairly well intact. Speech is clear. He has mild hypophonia, minimal voice tremor, mild lower lip and jaw tremor. Neck is supple with full range of motion. Oropharynx exam reveals: moderate mouth dryness. Mallampati is class II. Tonsils are absent. Tongue protrudes centrally and palate elevates symmetrically.  Chest: Clear to auscultation without wheezing, rhonchi or crackles noted.  Heart: S1+S2+0, regular and normal without murmurs, rubs or gallops noted.   Abdomen: Soft, non-tender and non-distended with normal bowel sounds appreciated on auscultation.  Extremities: There  is no pitting edema in the distal lower extremities  bilaterally. Pedal pulses are intact.  Skin: Warm and dry without trophic changes noted. There are no varicose veins.  Musculoskeletal: exam reveals no obvious joint deformities, tenderness or joint swelling or erythema.   Neurologically:  Mental status: The patient is awake, alert and oriented in all 4 spheres. His immediate and remote memory, attention, language skills and fund of knowledge are appropriate. There is no evidence of aphasia, agnosia, apraxia or anomia. Speech is clear with normal prosody and enunciation. Thought process is linear. Mood is normal and affect is normal.  Cranial nerves II - XII are as described above under HEENT exam. In addition: shoulder shrug is normal with equal shoulder height noted. Motor exam: Normal bulk, strength for age and tone is noted. There is no drift, or rebound. Mild intermittent resting tremor noted in both upper extremities particularly in the thumbs. He has a mild postural and action tremor in both upper extremities. Romberg is not testable safely today. Reflexes are 1-2+ in the upper extremities. Trace in the knees and absent in both ankles.toes are are flexor bilaterally. Fine motor skills and coordination:  globally mildly impaired. Heel to shin is doable. Finger to nose is tubal, on cerebellar testing otherwise he has no evidence of ataxia or past pointing.  Sensory exam: intact to light touch, pinprick, vibration, temperature sense  in the upper extremities but decreased sensation to temperature and pinprick in the right foot and hypersensitivity to pinprick and temperature in the left foot. He has decrease in vibration sense in both feet.   Gait, station and balance: He stands  with very mild difficulty and has to push himself up. Naturally he stands a little wide-based. He is able to stand narrow-based. He walks slightly cautiously and slowly, has preserved arm swing and turns in 3 steps. Tandem  walk is not testable safely.   Assessment and Plan:    In summary, Saverio Kader is a very pleasant 83 y.o.-year old male with An underlying medical history of a history of vitamin D deficiency, hypertension, heart disease, status post CABG and stent placement, prediabetes, hyperlipidemia, essential tremor, and history of elevated PSA, who presents for a new problem of gait disorder and falls, numbness in both feet. On examination, he has mild signs of neuropathy, could be secondary to prediabetes or diabetes. His diabetes control is good at this time but he is explained that even prediabetes can predispose patients to neuropathy. However, I think his gait disorder is more multifactorial in nature including secondary to essential tremor, normal aging, tendency to under hydrated, and arthritis affecting both knees and lower back. He is advised to stay better hydrated and start using a cane for safety. He had recent blood work about 3 months ago and we reviewed the results. A1c was good at the time. TSH was a little abnormal but he is being monitored for this. He worries about his thyroid function. I reassured him that he is going to be monitored for this by his PCP. From my end of things I suggested EMG and nerve conduction testing to both lower extremities and we will schedule him for this. In addition, we will do some additional blood work which we will do in the morning as we missed the cut off today. We will call him with his test results. His tremor has slightly progressed, he is advised that there is no specific medication available for this. I will see him back routinely after testing is done, we will keep them posted via  phone call as well. I answered all his questions today and he was in agreement with the plan. I spent 35 minutes in total face-to-face time with the patient, more than 50% of which was spent in counseling and coordination of care, reviewing test results, reviewing medication and  discussing or reviewing the diagnosis of gait d/o, falls, ET, the prognosis and treatment options. Pertinent laboratory and imaging test results that were available during this visit with the patient were reviewed by me and considered in my medical decision making (see chart for details).

## 2016-08-23 ENCOUNTER — Ambulatory Visit: Payer: Self-pay | Admitting: Physician Assistant

## 2016-08-23 DIAGNOSIS — I700 Atherosclerosis of aorta: Secondary | ICD-10-CM | POA: Insufficient documentation

## 2016-08-23 NOTE — Progress Notes (Deleted)
Patient ID: Paul Frazier, male   DOB: 1933-02-16, 83 y.o.   MRN: [id number]  Assessment and Plan:  Hypertension:  -Continue medication,  -occasional chest pain with exertion, patient to go to ER or to his cardiologist if this increases or is consistent.  -monitor blood pressure at home.  -Continue DASH diet.   -Reminder to go to the ER if any CP, SOB, nausea, dizziness, severe HA, changes vision/speech, left arm numbness and tingling, and jaw pain.  Cholesterol: -Continue diet and exercise.  -Check cholesterol.   Pre-diabetes: -Continue diet and exercise.  -Check A1C  Vitamin D Def: -check level -continue medications.   Depression/anxiety Continue wellbutrin and valium PRN  CAD Control blood pressure, cholesterol, glucose, increase exercise.   Right shoulder pain only at night FROM Cervical neck pillow/support, get Xray concerning with night pain.   Continue diet and meds as discussed. Further disposition pending results of labs.  HPI 83 y.o. male  presents for 3 month follow up with hypertension, hyperlipidemia, prediabetes and vitamin D.   His blood pressure has been controlled at home, today their BP is  .   He does not workout.  He reports that he and his wife have signed up for the [facility].  He has not used it yet.  He denies chest pain, shortness of breath, dizziness. 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   He is on cholesterol medication and denies myalgias. His cholesterol is not at goal. The cholesterol last visit was:   Lab Results  Component Value Date   CHOL 185 05/14/2016   HDL 54 05/14/2016   LDLCALC 119 05/14/2016   TRIG 60 05/14/2016   CHOLHDL 3.4 05/14/2016     He has not been working on diet and exercise for prediabetes, and denies foot ulcerations, hyperglycemia, hypoglycemia , increased appetite, nausea, paresthesia of the feet, polydipsia, polyuria, visual disturbances, vomiting and weight loss. Last A1C in the office was:  Lab  Results  Component Value Date   HGBA1C 5.6 05/14/2016    Patient is on Vitamin D supplement.  Lab Results  Component Value Date   VD25OH 30 05/14/2016     Had kidney stone in Nov, follows with [facility].   Current Medications:  Current Outpatient Prescriptions on File Prior to Visit  Medication Sig Dispense Refill  . aspirin 81 MG tablet Take 81 mg by mouth daily.    [name] buPROPion (WELLBUTRIN XL) 300 MG 24 hr tablet TAKE ONE TABLET BY MOUTH ONCE DAILY FOR  MOOD 90 tablet 2  . co-enzyme Q-10 30 MG capsule Take 60 mg by mouth daily.    . diazepam (VALIUM) 5 MG tablet TAKE 1/2-1 TAB THREE TIMES A DAY AS NEEDED FOR MUSCLE SPASM 90 tablet 0  . Digestive Enzymes CAPS Take 1-2 capsules by mouth as needed (digestive function).     . Magnesium Chloride (MAGNESIUM DR PO) Take 1 tablet by mouth daily.     . metoprolol succinate (TOPROL-XL) 25 MG 24 hr tablet Take 1 tablet (25 mg total) by mouth daily. 90 tablet 1  . nitroGLYCERIN (NITROSTAT) 0.4 MG SL tablet Place 1 tablet (0.4 mg total) under the tongue every 5 (five) minutes as needed for chest pain. 25 tablet 3  . Specialty Vitamins Products (PROSTATE) TABS Take 2 tablets by mouth daily.      No current facility-administered medications on file prior to visit.     Medical History:  Past Medical History:  Diagnosis Date  .  Arthritis   . CAD (coronary artery disease)   . Hyperlipidemia   . Hypertension   . Old myocardial infarct   . S/P CABG x 5 08/29/1984   LIMA to LAD,SVG to diagonal,SVG to OM,SVG to PDA and PLA  . Tremor, essential     Allergies:  Allergies  Allergen Reactions  . Erythromycin Nausea Only     Review of Systems:  Review of Systems  Constitutional: Negative for chills, fever and malaise/fatigue.  HENT: Negative for congestion, ear discharge, ear pain and sore throat.   Eyes: Negative.   Respiratory: Negative for cough, shortness of breath and wheezing.   Cardiovascular: Negative for chest pain,  palpitations and leg swelling.  Gastrointestinal: Negative for abdominal pain, blood in stool, constipation, diarrhea, heartburn, melena, nausea and vomiting.  Genitourinary: Negative.   Musculoskeletal: Positive for joint pain (right shoulder only at night) and myalgias (at night in feet). Negative for back pain, falls and neck pain.  Skin: Negative.   Neurological: Negative for dizziness, sensory change, loss of consciousness and headaches.  Psychiatric/Behavioral: Negative for depression. The patient is not nervous/anxious and does not have insomnia.     Family history- Review and unchanged  Social history- Review and unchanged  Physical Exam: There were no vitals taken for this visit. Wt Readings from Last 3 Encounters:  08/19/16 173 lb (78.5 kg)  05/14/16 175 lb 9.6 oz (79.7 kg)  12/30/15 178 lb (80.7 kg)    General Appearance: Well nourished well developed, in no apparent distress. Eyes: PERRLA, EOMs, conjunctiva no swelling or erythema ENT/Mouth: Ear canals normal without obstruction, swelling, erythma, discharge.  TMs normal bilaterally.  Oropharynx moist, clear, without exudate, or postoropharyngeal swelling. Neck: Supple, thyroid normal,no cervical adenopathy  Respiratory: Respiratory effort normal, Breath sounds clear A&P without rhonchi, wheeze, or rale.  No retractions, no accessory usage. Cardio: RRR with no MRGs. Brisk peripheral pulses without edema.  Abdomen: Soft, + BS,  Non tender, no guarding, rebound, hernias, masses. Musculoskeletal: Full ROM, 5/5 strength, Normal gait. Right shoulder with FROM, nontender, neck nontender with FROM.  Skin: Warm, dry without rashes, lesions, ecchymosis.  Neuro: Awake and oriented X 3, Cranial nerves intact. Normal muscle tone, no cerebellar symptoms. Psych: depressed affect, Insight and Judgment appropriate.    Vicie Mutters, PA-C 7:35 AM [facility]

## 2016-08-24 ENCOUNTER — Other Ambulatory Visit (INDEPENDENT_AMBULATORY_CARE_PROVIDER_SITE_OTHER): Payer: Self-pay

## 2016-08-24 DIAGNOSIS — M25562 Pain in left knee: Secondary | ICD-10-CM | POA: Diagnosis not present

## 2016-08-24 DIAGNOSIS — Z0289 Encounter for other administrative examinations: Secondary | ICD-10-CM

## 2016-08-24 DIAGNOSIS — Z9181 History of falling: Secondary | ICD-10-CM | POA: Diagnosis not present

## 2016-08-24 DIAGNOSIS — M1712 Unilateral primary osteoarthritis, left knee: Secondary | ICD-10-CM | POA: Diagnosis not present

## 2016-08-24 DIAGNOSIS — G629 Polyneuropathy, unspecified: Secondary | ICD-10-CM | POA: Diagnosis not present

## 2016-08-26 ENCOUNTER — Encounter: Payer: Self-pay | Admitting: Physician Assistant

## 2016-08-26 ENCOUNTER — Ambulatory Visit (INDEPENDENT_AMBULATORY_CARE_PROVIDER_SITE_OTHER): Payer: Medicare Other | Admitting: Physician Assistant

## 2016-08-26 VITALS — BP 126/80 | HR 58 | Temp 97.9°F | Resp 16 | Ht 66.5 in | Wt 177.8 lb

## 2016-08-26 DIAGNOSIS — E782 Mixed hyperlipidemia: Secondary | ICD-10-CM

## 2016-08-26 DIAGNOSIS — I700 Atherosclerosis of aorta: Secondary | ICD-10-CM | POA: Diagnosis not present

## 2016-08-26 DIAGNOSIS — I4729 Other ventricular tachycardia: Secondary | ICD-10-CM

## 2016-08-26 DIAGNOSIS — E069 Thyroiditis, unspecified: Secondary | ICD-10-CM | POA: Diagnosis not present

## 2016-08-26 DIAGNOSIS — R7309 Other abnormal glucose: Secondary | ICD-10-CM

## 2016-08-26 DIAGNOSIS — F3342 Major depressive disorder, recurrent, in full remission: Secondary | ICD-10-CM

## 2016-08-26 DIAGNOSIS — Z79899 Other long term (current) drug therapy: Secondary | ICD-10-CM

## 2016-08-26 DIAGNOSIS — I472 Ventricular tachycardia: Secondary | ICD-10-CM | POA: Diagnosis not present

## 2016-08-26 DIAGNOSIS — I10 Essential (primary) hypertension: Secondary | ICD-10-CM | POA: Diagnosis not present

## 2016-08-26 DIAGNOSIS — G609 Hereditary and idiopathic neuropathy, unspecified: Secondary | ICD-10-CM

## 2016-08-26 LAB — LIPID PANEL
Cholesterol: 174 mg/dL (ref <200)
HDL: 47 mg/dL (ref >40)
LDL Cholesterol: 90 mg/dL (ref <100)
Total CHOL/HDL Ratio: 3.7 Ratio (ref <5.0)
Triglycerides: 186 mg/dL — ABNORMAL HIGH (ref <150)
VLDL: 37 mg/dL — ABNORMAL HIGH (ref <30)

## 2016-08-26 LAB — HEAVY METALS PROFILE II, BLOOD
Arsenic: 6 ug/L (ref 2–23)
Cadmium: NOT DETECTED ug/L (ref 0.0–1.2)
Lead, Blood: 1 ug/dL (ref 0–19)
Mercury: 1.2 ug/L (ref 0.0–14.9)

## 2016-08-26 LAB — BASIC METABOLIC PANEL WITH GFR
BUN: 15 mg/dL (ref 7–25)
CO2: 25 mmol/L (ref 20–31)
Calcium: 9 mg/dL (ref 8.6–10.3)
Chloride: 102 mmol/L (ref 98–110)
Creat: 1.01 mg/dL (ref 0.70–1.11)
GFR, Est African American: 80 mL/min (ref >=60)
GFR, Est Non African American: 69 mL/min (ref >=60)
Glucose, Bld: 81 mg/dL (ref 65–99)
Potassium: 4.2 mmol/L (ref 3.5–5.3)
Sodium: 139 mmol/L (ref 135–146)

## 2016-08-26 LAB — CBC WITH DIFFERENTIAL/PLATELET
Basophils Absolute: 0 cells/uL (ref 0–200)
Basophils Relative: 0 %
Eosinophils Absolute: 237 cells/uL (ref 15–500)
Eosinophils Relative: 3 %
HCT: 47.1 % (ref 38.5–50.0)
Hemoglobin: 15.8 g/dL (ref 13.2–17.1)
Lymphocytes Relative: 35 %
Lymphs Abs: 2765 cells/uL (ref 850–3900)
MCH: 31.2 pg (ref 27.0–33.0)
MCHC: 33.5 g/dL (ref 32.0–36.0)
MCV: 93.1 fL (ref 80.0–100.0)
MPV: 9.8 fL (ref 7.5–12.5)
Monocytes Absolute: 790 cells/uL (ref 200–950)
Monocytes Relative: 10 %
Neutro Abs: 4108 cells/uL (ref 1500–7800)
Neutrophils Relative %: 52 %
Platelets: 224 10*3/uL (ref 140–400)
RBC: 5.06 MIL/uL (ref 4.20–5.80)
RDW: 13.9 % (ref 11.0–15.0)
WBC: 7.9 10*3/uL (ref 3.8–10.8)

## 2016-08-26 LAB — MULTIPLE MYELOMA PANEL, SERUM
Albumin SerPl Elph-Mcnc: 3.7 g/dL (ref 2.9–4.4)
Albumin/Glob SerPl: 1.5 (ref 0.7–1.7)
Alpha 1: 0.2 g/dL (ref 0.0–0.4)
Alpha2 Glob SerPl Elph-Mcnc: 0.6 g/dL (ref 0.4–1.0)
B-Globulin SerPl Elph-Mcnc: 1 g/dL (ref 0.7–1.3)
Gamma Glob SerPl Elph-Mcnc: 0.8 g/dL (ref 0.4–1.8)
Globulin, Total: 2.6 g/dL (ref 2.2–3.9)
IgA/Immunoglobulin A, Serum: 286 mg/dL (ref 61–437)
IgG (Immunoglobin G), Serum: 857 mg/dL (ref 700–1600)
IgM (Immunoglobulin M), Srm: 42 mg/dL (ref 15–143)
Total Protein: 6.3 g/dL (ref 6.0–8.5)

## 2016-08-26 LAB — HEPATIC FUNCTION PANEL
ALT: 21 U/L (ref 9–46)
AST: 22 U/L (ref 10–35)
Albumin: 4.1 g/dL (ref 3.6–5.1)
Alkaline Phosphatase: 60 U/L (ref 40–115)
Bilirubin, Direct: 0.1 mg/dL (ref <=0.2)
Indirect Bilirubin: 0.4 mg/dL (ref 0.2–1.2)
Total Bilirubin: 0.5 mg/dL (ref 0.2–1.2)
Total Protein: 6.5 g/dL (ref 6.1–8.1)

## 2016-08-26 LAB — TSH: TSH: 0.46 mIU/L (ref 0.40–4.50)

## 2016-08-26 LAB — MAGNESIUM: Magnesium: 2 mg/dL (ref 1.5–2.5)

## 2016-08-26 MED ORDER — CITALOPRAM HYDROBROMIDE 10 MG PO TABS: 10.0000 mg | ORAL_TABLET | Freq: Every day | ORAL | 2 refills | Status: DC

## 2016-08-26 NOTE — Progress Notes (Signed)
Patient ID: Paul Frazier, male   DOB: [birth year], 83 y.o.   MRN: [id number]  Assessment and Plan:  Nonsustained ventricular tachycardia ([place]) Continue cardio follow up  Essential hypertension - continue medications, DASH diet, exercise and monitor at home. Call if greater than 130/80.  -     CBC with Differential/Platelet -     BASIC METABOLIC PANEL WITH GFR -     Hepatic function panel -     TSH  Atherosclerosis of aorta (HCC) Control blood pressure, cholesterol, glucose, increase exercise.  -     Lipid panel  Hyperlipidemia -continue medications, check lipids, decrease fatty foods, increase activity.  -     Lipid panel  Medication management -     Magnesium  Other abnormal glucose Discussed general issues about diabetes pathophysiology and management., Educational material distributed., Suggested low cholesterol diet., Encouraged aerobic exercise., Discussed foot care., Reminded to get yearly retinal exam.  Recurrent major depressive disorder, in full remission ([name]) -     citalopram (CELEXA) 10 MG tablet; Take 1 tablet (10 mg total) by mouth daily.  Hereditary and idiopathic peripheral neuropathy Suggest EMG's with neuro -     Sedimentation rate -     RPR -     Lyme [name], Wstrn. Blt. IgG & IgM w/bands  Thyroiditis ? Contributing to memory, anxiety, worsening tremor, will recheck scan Continue BB -     NM THYROID SNG UPTAKE W/IMAGING; Future  Abnormal memory Check labs, normal neuro ? Depression versus vascular dementia- will try celexa, discussed vascular dementia, any worsening of symptoms go to ER, follow up neuro   Continue diet and meds as discussed. Further disposition pending results of labs. Future Appointments Date Time Provider [place]  09/30/2016 3:30 PM Laretta Alstrom [facility] None  09/30/2016 4:15 PM Penni Bombard, MD [facility] None  11/30/2016 9:00 AM Unk Pinto, MD [facility] None  12/20/2016 1:00 PM [name] Age, MD [facility] None     HPI 83 y.o. male  presents for 3 month follow up with hypertension, hyperlipidemia, prediabetes and vitamin D.   His blood pressure has been controlled at home, today their BP is BP: 126/80.   He does not workout.  He reports that he and his wife have signed up for the [facility].  He has not used it yet.  He denies chest pain, shortness of breath, dizziness.  He is on cholesterol medication and denies myalgias. His cholesterol is not at goal. The cholesterol last visit was:   Lab Results  Component Value Date   CHOL 185 05/14/2016   HDL 54 05/14/2016   LDLCALC 119 05/14/2016   TRIG 60 05/14/2016   CHOLHDL 3.4 05/14/2016    He has not been working on diet and exercise for prediabetes, and denies foot ulcerations, hyperglycemia, hypoglycemia , increased appetite, nausea, paresthesia of the feet, polydipsia, polyuria, visual disturbances, vomiting and weight loss. Last A1C in the office was:  Lab Results  Component Value Date   HGBA1C 5.6 05/14/2016   Patient is on Vitamin D supplement.  Lab Results  Component Value Date   VD25OH 35 05/14/2016     Had kidney stone in Nov, follows with [facility]. He is on wellbutrin and  Valium for anxiety/depression, in remission and doing well. He states he has been having memory issues x 1-2 years, has been getting worse last few months, trouble with remembering what he ate last night, issues with remembering things on Monday, some issues with things from the past as  well.  He has had some numbness/tingling in his toes, he has had several falls, following with neurology, has had normal  Lab Results  Component Value Date   TSH 0.29 (L) 05/14/2016    Current Medications:  Current Outpatient Prescriptions on File Prior to Visit  Medication Sig Dispense Refill  . aspirin 81 MG tablet Take 81 mg by mouth daily.    [name] buPROPion (WELLBUTRIN XL) 300 MG 24 hr tablet TAKE ONE TABLET BY MOUTH ONCE DAILY FOR  MOOD 90 tablet 2  . co-enzyme Q-10 30 MG  capsule Take 60 mg by mouth daily.    . diazepam (VALIUM) 5 MG tablet TAKE 1/2-1 TAB THREE TIMES A DAY AS NEEDED FOR MUSCLE SPASM 90 tablet 0  . Digestive Enzymes CAPS Take 1-2 capsules by mouth as needed (digestive function).     . Magnesium Chloride (MAGNESIUM DR PO) Take 1 tablet by mouth daily.     . metoprolol succinate (TOPROL-XL) 25 MG 24 hr tablet Take 1 tablet (25 mg total) by mouth daily. 90 tablet 1  . nitroGLYCERIN (NITROSTAT) 0.4 MG SL tablet Place 1 tablet (0.4 mg total) under the tongue every 5 (five) minutes as needed for chest pain. 25 tablet 3  . Specialty Vitamins Products (PROSTATE) TABS Take 2 tablets by mouth daily.      No current facility-administered medications on file prior to visit.     Medical History:  Past Medical History:  Diagnosis Date  . Arthritis   . CAD (coronary artery disease)   . Hyperlipidemia   . Hypertension   . Old myocardial infarct   . S/P CABG x 5 08/29/1984   LIMA to LAD,SVG to diagonal,SVG to OM,SVG to PDA and PLA  . Tremor, essential     Allergies:  Allergies  Allergen Reactions  . Erythromycin Nausea Only     Review of Systems:  Review of Systems  Constitutional: Negative for chills, diaphoresis, fever, malaise/fatigue and weight loss.  HENT: Negative for congestion, ear discharge, ear pain and sore throat.   Eyes: Negative.   Respiratory: Negative for cough, shortness of breath and wheezing.   Cardiovascular: Negative for chest pain, palpitations and leg swelling.  Gastrointestinal: Negative for abdominal pain, blood in stool, constipation, diarrhea, heartburn, melena, nausea and vomiting.  Genitourinary: Negative.   Musculoskeletal: Positive for joint pain (right shoulder only at night) and myalgias (at night in feet). Negative for back pain, falls and neck pain.  Skin: Negative.  Negative for rash.  Neurological: Positive for tingling, tremors and sensory change. Negative for dizziness, speech change, focal weakness,  seizures, loss of consciousness, weakness and headaches.  Psychiatric/Behavioral: Positive for depression and memory loss. Negative for hallucinations, substance abuse and suicidal ideas. The patient is nervous/anxious. The patient does not have insomnia.     Family history- Review and unchanged  Social history- Review and unchanged  Physical Exam: BP 126/80   Pulse (!) 58   Temp 97.9 F (36.6 C)   Resp 16   Ht 5' 6.5" (1.689 m)   Wt 177 lb 12.8 oz (80.6 kg)   SpO2 98%   BMI 28.27 kg/m  Wt Readings from Last 3 Encounters:  08/26/16 177 lb 12.8 oz (80.6 kg)  08/19/16 173 lb (78.5 kg)  05/14/16 175 lb 9.6 oz (79.7 kg)    General Appearance: Well nourished well developed, in no apparent distress. Eyes: PERRLA, EOMs, conjunctiva no swelling or erythema ENT/Mouth: Ear canals normal without obstruction, swelling, erythma, discharge.  TMs  normal bilaterally.  Oropharynx moist, clear, without exudate, or postoropharyngeal swelling. Neck: Supple, thyroid normal,no cervical adenopathy  Respiratory: Respiratory effort normal, Breath sounds clear A&P without rhonchi, wheeze, or rale.  No retractions, no accessory usage. Cardio: RRR with no MRGs. Brisk peripheral pulses without edema.  Abdomen: Soft, + BS,  Non tender, no guarding, rebound, hernias, masses. Musculoskeletal: Full ROM, 5/5 strength, Normal gait.  Skin: Warm, dry without rashes, lesions, ecchymosis.  Neuro: Awake and oriented X 3, Cranial nerves intact. Normal muscle tone, no cerebellar symptoms, minor intention tremor Psych: depressed affect, Insight and Judgment appropriate.    Vicie Mutters, PA-C 2:18 PM [facility]

## 2016-08-26 NOTE — Patient Instructions (Addendum)
Drink 64-80 oz of water a day Will add on VERY LOW dose of celexa 10mg  to take daily, can stop during spring or summer This is for memory.  Will repeat thyroid scan to look for elevated thyroid and see if you would benefit from treament Follow up with neurology  Vascular Dementia Introduction Dementia is a condition in which a person has problems with thinking, memory, and behavior that are severe enough to interfere with daily life. Vascular dementia is a type of dementia. It results from brain damage that is caused by the brain not getting enough blood. Vascular dementia usually begins between 109 and 81 years of age. What are the causes? Vascular dementia is caused by conditions that lessen blood flow to the brain. Common causes include:  Multiple small strokes. These may happen without symptoms (silent stroke).  Major stroke.  Damage to small blood vessels in the brain (cerebral small vessel disease). What increases the risk?  Advancing age.  Having had a stroke.  Having high blood pressure (hypertension) or high cholesterol.  Having a disease that affects the heart or blood vessels.  Smoking.  Having diabetes.  Being male.  Being obese.  Not being active.  Having depression. What are the signs or symptoms? Symptoms can vary a lot from one person to another. Symptoms may be mild or severe depending on the amount of damage and which parts of the brain have been affected. Symptoms may begin suddenly or may develop gradually. Symptoms may remain stable, or they may get worse over time. Symptoms of vascular dementia may be similar to those of Alzheimer disease. The two conditions can occur together (mixed dementia). Symptoms of vascular dementia may include: Mental  Confusion.  Memory problems.  Poor attention and concentration.  Trouble understanding speech.  Depression.  Personality changes.  Trouble recognizing familiar people.  Agitation or  aggression.  Paranoia.  Delusions or hallucinations. Physical  Weakness.  Poor balance.  Loss of bladder or bowel control (incontinence).  Unsteady walking (gait).  Speaking problems. Behavioral  Getting lost in familiar places.  Problems with planning and judgment.  Trouble following instructions.  Social problems.  Emotional outbursts.  Trouble with daily activities and self-care.  Problems handling money. How is this diagnosed? There is not a specific test to diagnose vascular dementia. The health care provider will consider the person's medical history and symptoms or changes that are reported by friends and family. The health care provider will do a physical exam and may order lab tests or other tests that check brain and nervous system function. Tests that may be done include:  Blood tests.  Brain imaging tests.  Tests of movement, speech, and other daily activities (neurological exam).  Tests of memory, thinking, and problem-solving (neuropsychological or neurocognitive testing). Diagnosis may involve several specialists. These may include a health care provider who specializes in the brain and nervous system (neurologist), a provider who specializes in disorders of the mind (psychiatrist), and a provider who focuses on speech and language changes (Electrical engineer). How is this treated? There is no cure for vascular dementia. Brain damage that has already occurred cannot be reversed. Treatment depends on:  How severe the condition is.  Which parts of the brain have been affected.  The person's overall health. Treatment measures aim to:  Treat the underlying cause of vascular dementia and manage risk factors. This may include:  Controlling blood pressure.  Lowering cholesterol.  Treating diabetes.  Quitting smoking.  Losing weight.  Manage symptoms.  Prevent further brain damage.  Improve the person's health and quality of life. Treatment  for dementia may involve a team of health care providers, including:  A neurologist.  A psychiatrist.  An occupational therapist.  A speech pathologist.  A cardiologist.  An exercise physiologist or physical therapist. Follow these instructions at home: Home care for a person with vascular dementia depends on what caused the condition and how severe the symptoms are. General guidelines for care at home include:  Following the health care provider's instructions for treating the condition that caused the dementia.  Using medicines only as told by the person's health care provider.  Creating a safe living space.  Learning ways to help the person remember people, appointments, and daily activities.  Finding a support group to help caregivers and family to cope with the effects of dementia.  Helping family and friends learn about ways to communicate with a person who has dementia.  Making sure the person keeps all follow-up visits and goes to all rehabilitation appointments as told by the health care team. This is important. Contact a health care provider if:  A fever develops.  New behavioral problems develop.  Problems with swallowing develop.  Confusion gets worse.  Sleepiness gets worse. Get help right away if:  Loss of consciousness occurs.  There is a sudden loss of speech, balance, or thinking ability.  New numbness or paralysis occurs.  Sudden, severe headache occurs.  Vision is lost or suddenly gets worse in one or both eyes. This information is not intended to replace advice given to you by your health care provider. Make sure you discuss any questions you have with your health care provider. Document Released: 06/25/2002 Document Revised: 12/11/2015 Document Reviewed: 10/16/2014  2017 Elsevier

## 2016-08-26 NOTE — Progress Notes (Signed)
Blood work normal, please notify patient.  [name] Age, MD, PhD [facility])

## 2016-08-27 LAB — SEDIMENTATION RATE: Sed Rate: 1 mm/hr (ref 0–20)

## 2016-08-27 LAB — RPR

## 2016-08-30 ENCOUNTER — Telehealth: Payer: Self-pay

## 2016-08-30 NOTE — Telephone Encounter (Signed)
-----   Message from [name] Age, MD sent at 08/26/2016  4:51 PM EST ----- Blood work normal, please notify patient.  [name] Age, MD, PhD [facility])

## 2016-08-30 NOTE — Progress Notes (Signed)
Pt aware of lab results & voiced understanding of those results.

## 2016-08-30 NOTE — Progress Notes (Signed)
Pt would like to know if the scan has already been cancel or does he need to cancel appt?

## 2016-08-30 NOTE — Telephone Encounter (Signed)
I called pt and advised him that his blood work is normal. Pt verbalized understanding of results. Pt asked that I mail him a copy of his results. I confirmed that the address that we have on file is correct. Pt had no questions at this time but was encouraged to call back if questions arise.

## 2016-08-31 LAB — LYME ABY, WSTRN BLT IGG & IGM W/BANDS

## 2016-09-02 ENCOUNTER — Ambulatory Visit: Payer: Self-pay | Admitting: Physician Assistant

## 2016-09-06 ENCOUNTER — Ambulatory Visit (HOSPITAL_COMMUNITY): Payer: Medicare Other

## 2016-09-06 DIAGNOSIS — M25562 Pain in left knee: Secondary | ICD-10-CM | POA: Diagnosis not present

## 2016-09-06 DIAGNOSIS — M1712 Unilateral primary osteoarthritis, left knee: Secondary | ICD-10-CM | POA: Diagnosis not present

## 2016-09-07 ENCOUNTER — Other Ambulatory Visit (HOSPITAL_COMMUNITY): Payer: Medicare Other

## 2016-09-08 ENCOUNTER — Ambulatory Visit (HOSPITAL_COMMUNITY): Payer: Medicare Other

## 2016-09-09 ENCOUNTER — Other Ambulatory Visit (HOSPITAL_COMMUNITY): Payer: Medicare Other

## 2016-09-13 ENCOUNTER — Ambulatory Visit (HOSPITAL_COMMUNITY): Payer: Medicare Other

## 2016-09-14 ENCOUNTER — Other Ambulatory Visit (HOSPITAL_COMMUNITY): Payer: Medicare Other

## 2016-09-15 DIAGNOSIS — L853 Xerosis cutis: Secondary | ICD-10-CM | POA: Diagnosis not present

## 2016-09-15 DIAGNOSIS — Z85828 Personal history of other malignant neoplasm of skin: Secondary | ICD-10-CM | POA: Diagnosis not present

## 2016-09-15 DIAGNOSIS — L821 Other seborrheic keratosis: Secondary | ICD-10-CM | POA: Diagnosis not present

## 2016-09-15 DIAGNOSIS — D1801 Hemangioma of skin and subcutaneous tissue: Secondary | ICD-10-CM | POA: Diagnosis not present

## 2016-09-15 DIAGNOSIS — L570 Actinic keratosis: Secondary | ICD-10-CM | POA: Diagnosis not present

## 2016-09-21 ENCOUNTER — Telehealth: Payer: Self-pay | Admitting: Diagnostic Neuroimaging

## 2016-09-21 NOTE — Telephone Encounter (Signed)
Patient called office in reference to NCV/EMG scheduled in March.  Patient would like more detail of what the test is going to consist of and what exactly will be going on during the NCV/EMG.  Please call

## 2016-09-28 NOTE — Telephone Encounter (Signed)
I spoke to the patient and answered his questions about the testing.

## 2016-09-30 ENCOUNTER — Encounter (INDEPENDENT_AMBULATORY_CARE_PROVIDER_SITE_OTHER): Payer: Self-pay

## 2016-09-30 ENCOUNTER — Ambulatory Visit (INDEPENDENT_AMBULATORY_CARE_PROVIDER_SITE_OTHER): Payer: Medicare Other | Admitting: Diagnostic Neuroimaging

## 2016-09-30 DIAGNOSIS — G629 Polyneuropathy, unspecified: Secondary | ICD-10-CM | POA: Diagnosis not present

## 2016-09-30 DIAGNOSIS — G250 Essential tremor: Secondary | ICD-10-CM

## 2016-09-30 DIAGNOSIS — R2689 Other abnormalities of gait and mobility: Secondary | ICD-10-CM

## 2016-09-30 DIAGNOSIS — Z0289 Encounter for other administrative examinations: Secondary | ICD-10-CM

## 2016-09-30 DIAGNOSIS — Z9181 History of falling: Secondary | ICD-10-CM

## 2016-10-06 NOTE — Procedures (Signed)
[facility]  NCS (NERVE CONDUCTION STUDY) WITH EMG (ELECTROMYOGRAPHY) REPORT   STUDY DATE: 10/06/16 PATIENT NAME: Paul Frazier DOB: [birth year] MRN: [id number]  ORDERING CLINICIAN: [name] Age, MD PhD   TECHNOLOGIST: Oneita Jolly ELECTROMYOGRAPHER: Earlean Polka. Deniz Eskridge, MD  CLINICAL INFORMATION: 83 year old male with numbness in toes and weakness in ankles for past 1 year.  FINDINGS: NERVE CONDUCTION STUDY: Right peroneal motor response is prolonged distal latency, normal amplitude, borderline slow conduction velocity.  Left peroneal motor response is prolonged distal latency, normal amplitude, normal conduction velocity.   Right tibial motor response has prolonged distal latency, normal amplitude, normal conduction velocity.  Left tibial motor response is normal.  Bilateral tibial F-wave latencies are normal.  Bilateral sural sensory responses are normal.  Bilateral superficial peroneal sensory responses are normal.    NEEDLE ELECTROMYOGRAPHY: Needle examination of right vastus medialis and right tibias anterior is normal.  Right gastrocnemius muscle notable for 1+ positive sharp waves, fibrillation potentials and complex repetitive discharges at rest and discrete motor unit recruitment of large motor units.  Right lumbar paraspinal muscles (L5-S1) and notable for 1+ positive sharp waves, fibrillation potentials and complex repetitive discharges with rare fasciculations.  Left lumbar paraspinal muscles (L5-S1) are normal.    IMPRESSION:  Abnormal study demonstrating: - Findings suggest evidence for right greater than left lumbar (L5-S1) radiculopathies. This is due to abnormal bilateral motor nerve responses with normal sensory nerve responses, in the setting of abnormal right lumbar paraspinal and right gastrocnemius needle EMG findings.  - No evidence for large fiber sensory neuropathy.      INTERPRETING PHYSICIAN:  Penni Bombard,  MD Certified in Neurology, Neurophysiology and Neuroimaging  [facility] [address], [place], [place] [address] [phone]  [facility]    Nerve / Sites Rec. Site Peak Lat Ref.  Amp Ref. Segments Distance    ms ms V V  cm  L Sural - Ankle (Calf)     Calf Ankle 4.0 ?4.4 16 ?6 Calf - Ankle 14  R Sural - Ankle (Calf)     Calf Ankle 3.7 ?4.4 6 ?6 Calf - Ankle 14  R Superficial peroneal - Ankle     Lat leg Ankle 4.4 ?4.4 9 ?6 Lat leg - Ankle 14  L Superficial peroneal - Ankle     Lat leg Ankle 4.0 ?4.4 7 ?6 Lat leg - Ankle 14     [facility]    Nerve / Sites Muscle Latency Ref. Amplitude Ref. Rel Amp Segments Distance Velocity Ref. Area    ms ms mV mV %  cm m/s m/s mVms  R Peroneal - EDB     Ankle EDB 6.9 ?6.5 6.6 ?2.0 100 Ankle - EDB 9   23.1     Fib head EDB 13.1  6.7  101 Fib head - Ankle 22 35 ?44 22.8     Pop fossa EDB 15.5  6.6  98.8 Pop fossa - Fib head 9 38 ?44 23.5         Pop fossa - Ankle      L Peroneal - EDB     Ankle EDB 6.7 ?6.5 4.9 ?2.0 100 Ankle - EDB 9   20.4     Fib head EDB 12.4  4.6  93.5 Fib head - Ankle 23 41 ?44 20.5     Pop fossa EDB 14.5  4.5  97.2 Pop fossa - Fib head 9 42 ?44 21.1  Pop fossa - Ankle      R Tibial - AH     Ankle AH 6.4 ?5.8 5.2 ?4.0 100 Ankle - AH 9   15.5     Pop fossa AH 15.7  4.6  88.2 Pop fossa - Ankle 36 39 ?41 14.8  L Tibial - AH     Ankle AH 4.9 ?5.8 3.8 ?4.0 100 Ankle - AH 9   13.9     Pop fossa AH 13.4  3.4  89 Pop fossa - Ankle 36 42 ?41 11.9     F  Wave    Nerve F Lat Ref.   ms ms  R Tibial - AH 57.2 ?56.0  L Tibial - AH 57.8 ?56.0     EMG full       EMG Summary Table    Spontaneous MUAP Recruitment  Muscle IA Fib PSW Fasc Other Amp Dur. Poly Pattern  R. Vastus medialis Normal None None None _______ Normal Normal Normal Normal  R. Tibialis anterior Normal None None None _______ Normal Normal Normal Normal  R. Gastrocnemius (Medial head) Decreased None None None _______ Normal Normal  Normal Reduced  R. Gastrocnemius (Lateral head) Normal 1+ 1+ None CRDs Increased Normal Normal Discrete  R. Lumbar paraspinals Normal 1+ 1+ Rare CRDs Normal Normal Normal Normal  L. Lumbar paraspinals Normal None None None CRDs Normal Normal Normal Normal

## 2016-10-07 NOTE — Progress Notes (Signed)
Please call patient regarding his recent EMG and nerve conduction test of the lower extremities. He does not have any evidence of widespread neuropathy or nerve damage which is reassuring, but he has evidence of pinched nerve type findings, most likely coming from the back at the lower end of the lumbar spine, L5-S1, right more than left, I do believe he has history of low back pain and we could investigate this further with a lumbar spine MRI of the wishes. If he does have radiating low back pain to the right or left, this may affect his walking and balance.  If he is agreeable to undergoing a lumbar spine MRI, I would be happy to order this and he may benefit from seeing a spine specialist as well.  Paul Age, MD, PhD [facility])

## 2016-10-11 ENCOUNTER — Telehealth: Payer: Self-pay

## 2016-10-11 DIAGNOSIS — R2689 Other abnormalities of gait and mobility: Secondary | ICD-10-CM

## 2016-10-11 DIAGNOSIS — G8929 Other chronic pain: Secondary | ICD-10-CM

## 2016-10-11 DIAGNOSIS — M5416 Radiculopathy, lumbar region: Secondary | ICD-10-CM

## 2016-10-11 DIAGNOSIS — M545 Low back pain: Secondary | ICD-10-CM

## 2016-10-11 DIAGNOSIS — R296 Repeated falls: Secondary | ICD-10-CM

## 2016-10-11 NOTE — Telephone Encounter (Signed)
Order faxed to [facility] 

## 2016-10-11 NOTE — Telephone Encounter (Signed)
-----   Message from [name] Age, MD sent at 10/07/2016  4:43 PM EDT ----- Please call patient regarding his recent EMG and nerve conduction test of the lower extremities. He does not have any evidence of widespread neuropathy or nerve damage which is reassuring, but he has evidence of pinched nerve type findings, most likely coming from the back at the lower end of the lumbar spine, L5-S1, right more than left, I do believe he has history of low back pain and we could investigate this further with a lumbar spine MRI of the wishes. If he does have radiating low back pain to the right or left, this may affect his walking and balance.  If he is agreeable to undergoing a lumbar spine MRI, I would be happy to order this and he may benefit from seeing a spine specialist as well.  [name] Age, MD, PhD [facility])

## 2016-10-11 NOTE — Addendum Note (Signed)
Addended by: [name] Age on: 10/11/2016 02:43 PM   Modules accepted: Orders

## 2016-10-11 NOTE — Telephone Encounter (Signed)
Will order MRI L spine.

## 2016-10-11 NOTE — Telephone Encounter (Signed)
I spoke to patient and he is aware of results and recommendations. He would like to purse MRI-L.   Patient asked for a copy of results to be mailed to him. I confirmed his address.

## 2016-10-26 ENCOUNTER — Ambulatory Visit
Admission: RE | Admit: 2016-10-26 | Discharge: 2016-10-26 | Disposition: A | Discharge status: Home / Self Care | Payer: Medicare Other | Source: Ambulatory Visit | Attending: Neurology | Admitting: Neurology

## 2016-10-26 DIAGNOSIS — M545 Low back pain: Secondary | ICD-10-CM

## 2016-10-26 DIAGNOSIS — R296 Repeated falls: Secondary | ICD-10-CM

## 2016-10-26 DIAGNOSIS — M5416 Radiculopathy, lumbar region: Secondary | ICD-10-CM

## 2016-10-26 DIAGNOSIS — R2689 Other abnormalities of gait and mobility: Secondary | ICD-10-CM

## 2016-10-26 DIAGNOSIS — M48061 Spinal stenosis, lumbar region without neurogenic claudication: Secondary | ICD-10-CM | POA: Diagnosis not present

## 2016-10-26 DIAGNOSIS — G8929 Other chronic pain: Secondary | ICD-10-CM

## 2016-10-28 ENCOUNTER — Telehealth: Payer: Self-pay | Admitting: Neurology

## 2016-10-28 DIAGNOSIS — M48061 Spinal stenosis, lumbar region without neurogenic claudication: Secondary | ICD-10-CM

## 2016-10-28 DIAGNOSIS — R269 Unspecified abnormalities of gait and mobility: Secondary | ICD-10-CM

## 2016-10-28 DIAGNOSIS — M9983 Other biomechanical lesions of lumbar region: Secondary | ICD-10-CM

## 2016-10-28 DIAGNOSIS — W19XXXS Unspecified fall, sequela: Secondary | ICD-10-CM

## 2016-10-28 DIAGNOSIS — R202 Paresthesia of skin: Secondary | ICD-10-CM

## 2016-10-28 DIAGNOSIS — R200 Anesthesia of skin: Secondary | ICD-10-CM

## 2016-10-28 NOTE — Telephone Encounter (Signed)
I called pt. I advised pt that Dr. Rexene Alberts reviewed their MRI results and found pt does have moderate to severe degenerative changes at multiple levels. Dr. Rexene Alberts recommends that pt see a spine specialist to discuss treatment for this further, given his history of gait disorder, balance issues, and falls. Our office will coordinate with the spinal specialist to get this appt set up, and that office should call the pt. Pt asked that I mail him a copy of his MRI lumbar spine. I verified that the address we have on file for him is correct. Pt verbalized understanding of results. Pt had no questions at this time but was encouraged to call back if questions arise.

## 2016-10-28 NOTE — Telephone Encounter (Signed)
Please call patient re: L spine MRI from 10/26/16:  It shows mod to severe degenerative changes at multiple levels and given his gait d/o, balance issues, falls, I recommend referral to spine specialist, I placed a ref to ortho.  He has L3-4: disc bulging and severe spinal stenosis and severe biforaminal stenosis, L4-5: disc bulging and facet hypertrophy with moderate spinal stenosis and severe biforaminal stenosis, L2-3: disc bulging and facet hypertrophy with moderate spinal stenosis and moderate biforaminal stenosis.

## 2016-11-03 ENCOUNTER — Other Ambulatory Visit: Payer: Self-pay | Admitting: Internal Medicine

## 2016-11-03 DIAGNOSIS — M62838 Other muscle spasm: Secondary | ICD-10-CM

## 2016-11-03 NOTE — Telephone Encounter (Signed)
Please call Diazepam 

## 2016-11-03 NOTE — Telephone Encounter (Signed)
Order has been sent

## 2016-11-04 ENCOUNTER — Other Ambulatory Visit: Payer: Self-pay | Admitting: Internal Medicine

## 2016-11-04 DIAGNOSIS — M62838 Other muscle spasm: Secondary | ICD-10-CM

## 2016-11-04 NOTE — Telephone Encounter (Signed)
Please call Diazepam 

## 2016-11-19 DIAGNOSIS — M48062 Spinal stenosis, lumbar region with neurogenic claudication: Secondary | ICD-10-CM | POA: Diagnosis not present

## 2016-11-29 DIAGNOSIS — M48062 Spinal stenosis, lumbar region with neurogenic claudication: Secondary | ICD-10-CM | POA: Diagnosis not present

## 2016-11-30 ENCOUNTER — Encounter: Payer: Self-pay | Admitting: Internal Medicine

## 2016-12-06 DIAGNOSIS — Z789 Other specified health status: Secondary | ICD-10-CM | POA: Diagnosis not present

## 2016-12-06 DIAGNOSIS — M25561 Pain in right knee: Secondary | ICD-10-CM | POA: Diagnosis not present

## 2016-12-06 DIAGNOSIS — M170 Bilateral primary osteoarthritis of knee: Secondary | ICD-10-CM | POA: Diagnosis not present

## 2016-12-06 DIAGNOSIS — M1712 Unilateral primary osteoarthritis, left knee: Secondary | ICD-10-CM | POA: Diagnosis not present

## 2016-12-06 DIAGNOSIS — M25562 Pain in left knee: Secondary | ICD-10-CM | POA: Diagnosis not present

## 2016-12-14 DIAGNOSIS — M25562 Pain in left knee: Secondary | ICD-10-CM | POA: Diagnosis not present

## 2016-12-14 DIAGNOSIS — M1712 Unilateral primary osteoarthritis, left knee: Secondary | ICD-10-CM | POA: Diagnosis not present

## 2016-12-16 DIAGNOSIS — M1711 Unilateral primary osteoarthritis, right knee: Secondary | ICD-10-CM | POA: Diagnosis not present

## 2016-12-16 DIAGNOSIS — M25561 Pain in right knee: Secondary | ICD-10-CM | POA: Diagnosis not present

## 2016-12-20 ENCOUNTER — Encounter: Payer: Self-pay | Admitting: Neurology

## 2016-12-20 ENCOUNTER — Ambulatory Visit (INDEPENDENT_AMBULATORY_CARE_PROVIDER_SITE_OTHER): Payer: Medicare Other | Admitting: Neurology

## 2016-12-20 VITALS — BP 151/71 | HR 62 | Ht 66.5 in | Wt 168.0 lb

## 2016-12-20 DIAGNOSIS — R2689 Other abnormalities of gait and mobility: Secondary | ICD-10-CM | POA: Diagnosis not present

## 2016-12-20 DIAGNOSIS — M48061 Spinal stenosis, lumbar region without neurogenic claudication: Secondary | ICD-10-CM | POA: Diagnosis not present

## 2016-12-20 DIAGNOSIS — G250 Essential tremor: Secondary | ICD-10-CM | POA: Diagnosis not present

## 2016-12-20 NOTE — Progress Notes (Addendum)
Subjective:    Patient ID: Paul Frazier is a 83 y.o. male.  HPI     Interim history:   Paul Frazier is an 83 year old right-handed gentleman with an underlying medical history of vitamin D deficiency, hypertension, heart disease, status post CABG and stent placement, prediabetes, hyperlipidemia, and elevated PSA, who presents for follow up consultation of his gait disorder, near falls as well as a history of essential tremor. I last saw him on 08/19/2016 for new problem visit, at which time he reported having had 2 falls in the past 6 months, was complaining of toe numbness in both feet. He has had some injections in his knees which she felt were helpful. He had injured his right arm and elbow secondary to fall and also fell in the bathroom but thankfully had no head injuries or loss of consciousness. He felt that his tremor was a little worse in his hands. He had stress particularly regarding his wife's health. He was not always hydrating well enough. I felt he had multifactorial gait disorder and suggested we proceed with EMG nerve conduction testing as well as blood work. His blood test results were benign number called him with his test results. EMG and nerve conduction testing on 09/30/2016 showed: IMPRESSION:  Abnormal study demonstrating: - Findings suggest evidence for right greater than left lumbar (L5-S1) radiculopathies. This is due to abnormal bilateral motor nerve responses with normal sensory nerve responses, in the setting of abnormal right lumbar paraspinal and right gastrocnemius needle EMG findings.  - No evidence for large fiber sensory neuropathy.    We called him with his test results. Based on the test results I also recommended we proceed with a lumbar spine MRI. He was agreeable. He had a lumbar spine MRI without contrast on 10/26/2016, which I reviewed:  IMPRESSION:  Abnormal MRI lumbar spine (without) demonstrating: 1. At L3-4: disc bulging and facet hypertrophy with severe  spinal stenosis and severe biforaminal stenosis  2. At L4-5: disc bulging and facet hypertrophy with moderate spinal stenosis and severe biforaminal stenosis 3. At L2-3: disc bulging and facet hypertrophy with moderate spinal stenosis and moderate biforaminal stenosis  4. Sacralization of L5 with transitional features. Correlate with plain film xrays if intervention or surgery is planned.  We called him with his test results.  I suggested referral to orthopedics and he agreed.   Today, 12/20/2016: He reports doing about the same, has had 2 falls, maybe in March. He saw someone at [facility] (but it was actually [facility]), was told he had spinal stenosis, he was advised to consider ESI, but he did not want to do this. Takes Hemp tabs and feels it has helped his lip tremor. Does not exercise regularly, has a [employer], but does not like to leave wife unattended. Does not drink much in the way of water, he likes tea or coffee. He is not always sleeping well.   Addendum: I reviewed records from [facility]. He saw Dr. Laurena Bering PA, Paul Frazier on 11/19/2016, and then Dr. Mina Marble on 11/29/16 for consideration of epidural steroid injections.  The patient's allergies, current medications, family history, past medical history, past social history, past surgical history and problem list were reviewed and updated as appropriate.   Previously (copied from previous notes for reference):    He had a head CT without contrast on 05/13/2015 which I reviewed: Interval resolution of left frontal subdural hematoma, no acute findings, mild atrophy and mild chronic small vessel  ischemic changes. Of note, he has a history of fall in 2013 and known history of subdural hematoma.   I saw him on 11/13/2013, at which time he reported that the tremor was fluctuating. He had experienced intermittent vertigo. I did not suggest any medication changes for fear of causing his pulse to drop  with increase in beta blocker and his balance to get worse with a trial of primidone.   I saw him on 09/14/2012, at which time I kept him on low-dose metoprolol as he already had a low heart rate and I did not start him on primidone for fear of exacerbating his dizziness and balance problems and sedation, given his advanced age.  I first met him on 08/24/2012, which time he reported that he fell in December and hit his head. He was already on low-dose beta blocker because of his heart and I did not suggest that he increase it because his heart rate was already in the 50s. We talked about adding another medication down the Road but I did not suggest that we start him just yet. I did suggest a head CT based on his history of fall. He had a head CT on 09/06/2012 which showed a left frontal chronic subdural hematoma with mild midline shift but because he had moderate frontal atrophy there was not a whole other midline shift. I suggested that we repeat his head CT and we called him with his test results. He had a repeat head CT on 10/05/2012: Chronic left frontal subdural hematoma. Mild mass effect upon underlying frontal lobe. Minimal 2 mm of left to right midline shift. Compared to the prior CT from 09/06/12, there is slight reduction in size of the subdural hematoma.  He and his wife noted that his tremors have become worse since he had a heart attack last year. He has a prior history of coronary artery bypass surgery in the 80s and also had a stent placed in 2001. He has not noticed much in the way of difficulty with his posture. He does notice sometimes that his balance is not very good. Of note he has fallen recently before Christmas last year. He actually tripped over a piece of wire that was outside and landed on the ground which was cold and frozen at the time. He fell and hit his for head. He has not had a head CT. He reports no residual headaches are one-sided weakness, numbness, slurring of speech or  droopy face. Nevertheless at the time he had a severe headache. He's had degenerative spine disease and has had epidural injections to his neck. He's had some numbness and tingling radiating to his left index finger. He has seen a neurosurgeon who suggested surgery but left it up to him to decide. He has had epidural injection had his last injection in February 2012.  He has had some lightheadedness, and recently was told to stop his Prinivil. He has not fallen, but has had some close calls. He denies vertigo. Never had TIA or stroke symptoms, denying sudden onset of one sided weakness, numbness, tingling, slurring of speech or droopy face, hearing loss, tinnitus, diplopia or visual field cut or monocular loss of vision, and denies recurrent headaches.  His tremor affects both hands but is worse on the left. It is primarily a action tremor and his handwriting is impaired. 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 He was very active in his work and had to stop suddenly because of his heart  attack. He felt even if he had not stop working because of his heart he would've stopped because of his trembling. He has not noticed a lower extremity tremor. He does not report any stiffness or drooling. He does not report any memory difficulties or other cognitive impairment. He tries to walk regularly and his wife has not noticed any changes in his gait.  He had a brother who passed away at age 62, from cancer in his lymph nodes and he had PD. He recalls no other FHx of tremors. He fell off a ladder in 2009 and had surgery.   His Past Medical History Is Significant For: Past Medical History:  Diagnosis Date  . Arthritis   . CAD (coronary artery disease)   . Hyperlipidemia   . Hypertension   . Old myocardial infarct   . S/P CABG x 5 08/29/1984   LIMA to LAD,SVG to diagonal,SVG to OM,SVG to PDA and PLA  . Tremor, essential     His Past Surgical History Is Significant For: Past Surgical History:   Procedure Laterality Date  . CHOLECYSTECTOMY  10/1990  . CORONARY ARTERY BYPASS GRAFT  1986   x5 LIMA to LAD,SVG to diagonal,SVG to OM,SVG to PDA and PLA  . CORONARY STENT PLACEMENT    . INGUINAL HERNIA REPAIR  04/25/2012   right- laparoscopic  . R/P Myoview  12/27/2008   no ischemia  . US ECHOCARDIOGRAPHY  12/27/2008   concentric LVH,mild to mod MR, TR and pulmonary hypertension. EF 45-50%    His Family History Is Significant For: Family History  Problem Relation Age of Onset  . Cancer Father        bone  . Cancer Brother        lymphnode    His Social History Is Significant For: Social History   Social History  . Marital status: Married    Spouse name: Paul Frazier  . Number of children: 3  . Years of education: 14   Occupational History  . Retired      retired   Social History Main Topics  . Smoking status: Former Smoker    Quit date: 07/19/1970  . Smokeless tobacco: Never Used  . Alcohol use No  . Drug use: No  . Sexual activity: Not Asked   Other Topics Concern  . None   Social History Narrative   Drinks 1-2 cups of coffee a day, occasional coke     His Allergies Are:  Allergies  Allergen Reactions  . Erythromycin Nausea Only  :   His Current Medications Are:  Outpatient Encounter Prescriptions as of 12/20/2016  Medication Sig  . aspirin 81 MG tablet Take 81 mg by mouth daily.  [name] buPROPion (WELLBUTRIN XL) 300 MG 24 hr tablet TAKE ONE TABLET BY MOUTH ONCE DAILY FOR  MOOD  . co-enzyme Q-10 30 MG capsule Take 60 mg by mouth daily.  . diazepam (VALIUM) 5 MG tablet TAKE 1/2-1 TAB THREE TIMES A DAY AS NEEDED FOR MUSCLE SPASMS  . Digestive Enzymes CAPS Take 1-2 capsules by mouth as needed (digestive function).   . Magnesium Chloride (MAGNESIUM DR PO) Take 1 tablet by mouth daily.   . metoprolol succinate (TOPROL-XL) 25 MG 24 hr tablet Take 1 tablet (25 mg total) by mouth daily.  . nitroGLYCERIN (NITROSTAT) 0.4 MG SL tablet Place 1 tablet (0.4 mg total) under the  tongue every 5 (five) minutes as needed for chest pain.  [name] Specialty Vitamins Products (PROSTATE) TABS Take 2 tablets by  mouth daily.   . [DISCONTINUED] citalopram (CELEXA) 10 MG tablet Take 1 tablet (10 mg total) by mouth daily.   No facility-administered encounter medications on file as of 12/20/2016.   :  Review of Systems:  Out of a complete 14 point review of systems, all are reviewed and negative with the exception of these symptoms as listed below: Review of Systems  Neurological:       Pt presents today to follow up. Pt says things are going well. Pt does report having 2 falls recently. One fall "split the skin" on the back of his arm.    Objective:  Neurologic Exam  Physical Exam Physical Examination:   Vitals:   12/20/16 1304  BP: (!) 151/71  Pulse: 62   General Examination: The patient is a very pleasant 83 y.o. male in no acute distress. He appears well-developed and well-nourished and well groomed.   HEENT: Normocephalic, atraumatic, pupils are equal, round and reactive to light and accommodation, status post cataract repairs. Extraocular tracking is fairly good, no nystagmus is noted. Hearing is grossly intact. Speech is clear. He has a subtle lower lip and jaw tremor. Neck is supple with full range of motion. Oropharynx exam reveals: moderate mouth dryness. Mallampati is class II. Tonsils are absent. Tongue protrudes centrally and palate elevates symmetrically.  Chest: Clear to auscultation without wheezing, rhonchi or crackles noted.  Heart: S1+S2+0, regular and normal without murmurs, rubs or gallops noted.   Abdomen: Soft, non-tender and non-distended with normal bowel sounds appreciated on auscultation.  Extremities: There is no pitting edema in the distal lower extremities bilaterally. Pedal pulses are intact.  Skin: Warm and dry without trophic changes noted. There are no varicose veins.  Musculoskeletal: exam reveals no obvious joint deformities,  tenderness or joint swelling or erythema, reports b/l knee pain, feels like R ankle is unstable.   Neurologically:  Mental status: The patient is awake, alert and oriented in all 4 spheres. His immediate and remote memory, attention, language skills and fund of knowledge are appropriate. There is no evidence of aphasia, agnosia, apraxia or anomia. Speech is clear with normal prosody and enunciation. Thought process is linear. Mood is normal and affect is normal.  Cranial nerves II - XII are as described above under HEENT exam.  Motor exam: Normal bulk, strength for age and tone is noted. There is no drift, or rebound. Mild intermittent resting tremor noted in both upper extremities particularly in the thumbs, stable. He has a mild postural and action tremor in both upper extremities. Romberg is not testable safely. Reflexes are 1+ in the upper extremities, trace in the knees and absent in both ankles. Fine motor skills and coordination: globally mildly impaired, stable from last time. Sensory exam: intact to light touch.   Gait, station and balance: He stands with very mild difficulty and has to push himself up. Naturally he stands a little wide-based, but is able to stand narrow-based. He walks slightly cautiously and slowly, has preserved arm swing and turns in 3 steps. Tandem walk is not testable safely. He did not bring a cane or walker.    Assessment and Plan:    In summary, Roby Donaway is a very pleasant 83 year old male with an underlying medical history of a history of vitamin D deficiency, hypertension, heart disease, status post CABG and stent placement, prediabetes, hyperlipidemia, essential tremor, and history of elevated PSA, who presents for Follow-up consultation of his gait disorder and history of falls. Likely, he has  a multifactorial gait disorder secondary to a history of tremors, normal aging, lower back degenerative disease including spinal stenosis and having fallen previously  which in and of itself is also a fall risk. He is advised to use a cane if not a walker. He has a walker available at home. We are requesting records from his orthopedic consultation recently. We talked about his lumbar spine MRI results and recent EMG and nerve conduction testing results. He does not always hydrated well and he is encouraged to do better with that, in addition, he will benefit from regular exercise which he is not currently pursuing. At this juncture, I suggested as needed follow-up. I answered all his questions today and he was in agreement. I spent 20 minutes in total face-to-face time with the patient, more than 50% of which was spent in counseling and coordination of care, reviewing test results, reviewing medication and discussing or reviewing the diagnosis of gait d/o, the prognosis and treatment options. Pertinent laboratory and imaging test results that were available during this visit with the patient were reviewed by me and considered in my medical decision making (see chart for details).

## 2016-12-20 NOTE — Patient Instructions (Signed)
Your exam is stable.  Your gait and balance issues are due to multiple issues.  Please consider using a walker or at least a cane.  Please stay well hydrated and try to exercise at your local [employer].  I can see you back as needed at this point.

## 2016-12-22 DIAGNOSIS — M1712 Unilateral primary osteoarthritis, left knee: Secondary | ICD-10-CM | POA: Diagnosis not present

## 2016-12-22 DIAGNOSIS — M25562 Pain in left knee: Secondary | ICD-10-CM | POA: Diagnosis not present

## 2016-12-23 DIAGNOSIS — M1711 Unilateral primary osteoarthritis, right knee: Secondary | ICD-10-CM | POA: Diagnosis not present

## 2016-12-23 DIAGNOSIS — M25561 Pain in right knee: Secondary | ICD-10-CM | POA: Diagnosis not present

## 2016-12-27 DIAGNOSIS — M170 Bilateral primary osteoarthritis of knee: Secondary | ICD-10-CM | POA: Diagnosis not present

## 2016-12-27 DIAGNOSIS — M25562 Pain in left knee: Secondary | ICD-10-CM | POA: Diagnosis not present

## 2016-12-27 DIAGNOSIS — M25561 Pain in right knee: Secondary | ICD-10-CM | POA: Diagnosis not present

## 2017-01-03 ENCOUNTER — Encounter: Payer: Self-pay | Admitting: Internal Medicine

## 2017-01-03 ENCOUNTER — Ambulatory Visit (INDEPENDENT_AMBULATORY_CARE_PROVIDER_SITE_OTHER): Payer: Medicare Other | Admitting: Internal Medicine

## 2017-01-03 VITALS — BP 132/64 | HR 60 | Temp 97.5°F | Resp 16 | Ht 66.25 in | Wt 170.0 lb

## 2017-01-03 DIAGNOSIS — Z125 Encounter for screening for malignant neoplasm of prostate: Secondary | ICD-10-CM

## 2017-01-03 DIAGNOSIS — R7303 Prediabetes: Secondary | ICD-10-CM | POA: Diagnosis not present

## 2017-01-03 DIAGNOSIS — I25118 Atherosclerotic heart disease of native coronary artery with other forms of angina pectoris: Secondary | ICD-10-CM | POA: Diagnosis not present

## 2017-01-03 DIAGNOSIS — Z79899 Other long term (current) drug therapy: Secondary | ICD-10-CM | POA: Diagnosis not present

## 2017-01-03 DIAGNOSIS — Z136 Encounter for screening for cardiovascular disorders: Secondary | ICD-10-CM

## 2017-01-03 DIAGNOSIS — I10 Essential (primary) hypertension: Secondary | ICD-10-CM | POA: Diagnosis not present

## 2017-01-03 DIAGNOSIS — Z1212 Encounter for screening for malignant neoplasm of rectum: Secondary | ICD-10-CM

## 2017-01-03 DIAGNOSIS — N320 Bladder-neck obstruction: Secondary | ICD-10-CM | POA: Diagnosis not present

## 2017-01-03 DIAGNOSIS — I700 Atherosclerosis of aorta: Secondary | ICD-10-CM

## 2017-01-03 DIAGNOSIS — E559 Vitamin D deficiency, unspecified: Secondary | ICD-10-CM | POA: Diagnosis not present

## 2017-01-03 DIAGNOSIS — R7309 Other abnormal glucose: Secondary | ICD-10-CM

## 2017-01-03 DIAGNOSIS — E782 Mixed hyperlipidemia: Secondary | ICD-10-CM

## 2017-01-03 LAB — CBC WITH DIFFERENTIAL/PLATELET
Basophils Absolute: 0 cells/uL (ref 0–200)
Basophils Relative: 0 %
Eosinophils Absolute: 231 cells/uL (ref 15–500)
Eosinophils Relative: 3 %
HCT: 45.1 % (ref 38.5–50.0)
Hemoglobin: 15.1 g/dL (ref 13.2–17.1)
Lymphocytes Relative: 38 %
Lymphs Abs: 2926 cells/uL (ref 850–3900)
MCH: 31.1 pg (ref 27.0–33.0)
MCHC: 33.5 g/dL (ref 32.0–36.0)
MCV: 93 fL (ref 80.0–100.0)
MPV: 10.4 fL (ref 7.5–12.5)
Monocytes Absolute: 616 cells/uL (ref 200–950)
Monocytes Relative: 8 %
Neutro Abs: 3927 cells/uL (ref 1500–7800)
Neutrophils Relative %: 51 %
Platelets: 194 10*3/uL (ref 140–400)
RBC: 4.85 MIL/uL (ref 4.20–5.80)
RDW: 14.6 % (ref 11.0–15.0)
WBC: 7.7 10*3/uL (ref 3.8–10.8)

## 2017-01-03 NOTE — Patient Instructions (Signed)

## 2017-01-03 NOTE — Progress Notes (Signed)
[facility]   Unk Pinto, M.D.      Uvaldo Bristle. Silverio Lay, P.A.-C [facility]                [address] [place], [place] [address] Telephone [phone] Telefax [phone]  Comprehensive Evaluation & Examination     This very nice 83 y.o. MWM presents for a comprehensive evaluation and management of multiple medical co-morbidities.  Patient has been followed for HTN, Prediabetes, Hyperlipidemia and Vitamin D Deficiency. Patient also has h/o essential tremor followed by Dr Rexene Alberts and patient has a multifactorial unstable gait & is high fall risk.      HTN predates circa 1980's. Patient's BP has been controlled at home.  Today's BP is at goal - 132/64. Patient has ASCAD with hx/o a CABG in 1986 and then PCA/Stenting in 2001 and more recently has a Negative Heart Cath in 2012. Patient denies any cardiac symptoms as chest pain, palpitations, shortness of breath, dizziness or ankle swelling.     Patient's hyperlipidemia is controlled with diet and medications. Patient denies myalgias or other medication SE's. Last lipids were at goal albeit sl elevated Trig's: Lab Results  Component Value Date   CHOL 174 08/26/2016   HDL 47 08/26/2016   LDLCALC 90 08/26/2016   TRIG 186 (H) 08/26/2016   CHOLHDL 3.7 08/26/2016      Patient has prediabetes (A1c 5.8% in 2013)  and patient denies reactive hypoglycemic symptoms, visual blurring, diabetic polys or paresthesias. Last A1c was at goal: Lab Results  Component Value Date   HGBA1C 5.6 05/14/2016       Finally, patient has history of Vitamin D Deficiency ("40" in 2011) and last vitamin D was at goal: Lab Results  Component Value Date   VD25OH 73 05/14/2016   Current Outpatient Prescriptions on File Prior to Visit  Medication Sig  . aspirin 81 MG  Take 81 mg by mouth daily.  [name] buPROPion-XL 300 MG TAKE ONE TAB ONCE DAILY FOR  MOOD  . co-enzyme Q-10 30 MG Take 60  mg by mouth daily.  . diazepam  5 MG tablet TAKE 1/2-1 TAB 3 x A DAY AS NEEDED   . Digestive Enzymes CAPS Take 1-2 capsules by mouth as needed (digestive function).   . Magnesium  Take 1 tab daily.   . metoprolol succ-XL) 25 MG  Take 1 tab daily.  [name] NITROSTAT 0.4 MG SL   as needed for chest pain.  [name] Specialty Vit Products (PROSTATE)  Take 2 tab daily.    Allergies  Allergen Reactions  . Erythromycin Nausea Only   Past Medical History:  Diagnosis Date  . Arthritis   . CAD (coronary artery disease)   . Hyperlipidemia   . Hypertension   . Old myocardial infarct   . S/P CABG x 5 08/29/1984   LIMA to LAD,SVG to diagonal,SVG to OM,SVG to PDA and PLA  . Tremor, essential    Health Maintenance  Topic Date Due  . PNA vac Low Risk Adult (2 of 2 - PCV13) 10/16/2015  . INFLUENZA VACCINE  02/16/2017  . TETANUS/TDAP  07/19/2021   Immunization History  Administered Date(s) Administered  . Influenza, High Dose Seasonal PF 05/14/2016  . Pneumococcal Polysaccharide-23 10/16/2014  . Td 07/20/2011   Past Surgical History:  Procedure Laterality Date  . CHOLECYSTECTOMY  10/1990  . CORONARY ARTERY BYPASS  GRAFT  1986   x5 LIMA to LAD,SVG to diagonal,SVG to OM,SVG to PDA and PLA  . CORONARY STENT PLACEMENT    . INGUINAL HERNIA REPAIR  04/25/2012   right- laparoscopic  . R/P Myoview  12/27/2008   no ischemia  . US ECHOCARDIOGRAPHY  12/27/2008   concentric LVH,mild to mod MR, TR and pulmonary hypertension. EF 45-50%   Family History  Problem Relation Age of Onset  . Cancer Father        bone  . Cancer Brother        lymphnode    Social History  . Marital status: Married    Spouse name: [name]  . Number of children: 3  . Years of education: 14   Occupational History  . Retired     Social History Main Topics  . Smoking status: Former Smoker    Quit date: 07/19/1970  . Smokeless tobacco: Never Used  . Alcohol use No  . Drug use: No  . Sexual activity: Not on file   Social History  Narrative   Drinks 1-2 cups of coffee a day, occasional coke     ROS Constitutional: Denies fever, chills, weight loss/gain, headaches, insomnia,  night sweats or change in appetite. Does c/o fatigue. Eyes: Denies redness, blurred vision, diplopia, discharge, itchy or watery eyes.  ENT: Denies discharge, congestion, post nasal drip, epistaxis, sore throat, earache, hearing loss, dental pain, Tinnitus, Vertigo, Sinus pain or snoring.  Cardio: Denies chest pain, palpitations, irregular heartbeat, syncope, dyspnea, diaphoresis, orthopnea, PND, claudication or edema Respiratory: denies cough, dyspnea, DOE, pleurisy, hoarseness, laryngitis or wheezing.  Gastrointestinal: Denies dysphagia, heartburn, reflux, water brash, pain, cramps, nausea, vomiting, bloating, diarrhea, constipation, hematemesis, melena, hematochezia, jaundice or hemorrhoids Genitourinary: Denies dysuria, frequency, urgency, nocturia, hesitancy, discharge, hematuria or flank pain Musculoskeletal: Denies arthralgia, myalgia, stiffness, Jt. Swelling, pain, limp or strain/sprain. Denies Falls. Skin: Denies puritis, rash, hives, warts, acne, eczema or change in skin lesion Neuro: No weakness, tremor, incoordination, spasms, paresthesia or pain Psychiatric: Denies confusion, memory loss or sensory loss. Denies Depression. Endocrine: Denies change in weight, skin, hair change, nocturia, and paresthesia, diabetic polys, visual blurring or hyper / hypo glycemic episodes.  Heme/Lymph: No excessive bleeding, bruising or enlarged lymph nodes.  Physical Exam  BP 132/64   Pulse 60   Temp 97.5 F (36.4 C)   Resp 16   Ht 5' 6.25" (1.683 m)   Wt 170 lb (77.1 kg)   BMI 27.23 kg/m   General Appearance: Well nourished and well groomed and in no apparent distress.  Eyes: PERRLA, EOMs, conjunctiva no swelling or erythema, normal fundi and vessels. Sinuses: No frontal/maxillary tenderness ENT/Mouth: EACs patent / TMs  nl. Nares clear without  erythema, swelling, mucoid exudates. Oral hygiene is good. No erythema, swelling, or exudate. Tongue normal, non-obstructing. Tonsils not swollen or erythematous. Hearing normal.  Neck: Supple, thyroid normal. No bruits, nodes or JVD. Respiratory: Respiratory effort normal.  BS equal and clear bilateral without rales, rhonci, wheezing or stridor. Cardio: Heart sounds are normal with regular rate and rhythm and no murmurs, rubs or gallops. Peripheral pulses are normal and equal bilaterally without edema. No aortic or femoral bruits. Chest: symmetric with normal excursions and percussion.  Abdomen: Soft, with Nl bowel sounds. Nontender, no guarding, rebound, hernias, masses, or organomegaly.  Lymphatics: Non tender without lymphadenopathy.  Genitourinary: DRE - deferred for age. Musculoskeletal: Full ROM all peripheral extremities, joint stability, 5/5 strength and sl unsteady broad based gait. Skin: Warm and dry  without rashes, lesions, cyanosis, clubbing or  ecchymosis.  Neuro: Cranial nerves intact, reflexes E's Nl , KJ's 1+ and AJ absent. Normal muscle tone, no cerebellar symptoms. Sensation intact to touch and Monofilament to the toes bilaterally, but decreased to vibratory at the level of the ankles.  Pysch: Alert and oriented X 3 with normal affect, insight and judgment appropriate.   Assessment and Plan  1. Essential hypertension  - EKG 12-Lead - [place], RETROPERITNL ABD,  LTD - Urinalysis, Routine w reflex microscopic - Microalbumin / creatinine urine ratio - CBC with Differential/Platelet - BASIC METABOLIC PANEL WITH GFR - Magnesium - TSH  2. Hyperlipidemia, mixed  - EKG 12-Lead - [place], RETROPERITNL ABD,  LTD - Hepatic function panel - Lipid panel - TSH  3. Prediabetes  - EKG 12-Lead - [place], RETROPERITNL ABD,  LTD - Hemoglobin A1c - Insulin, random  4. Vitamin D deficiency  - VITAMIN D 25 Hydroxy  5. Other abnormal glucose  - Hemoglobin A1c - Insulin, random  6.  Screening for rectal cancer  - POC Hemoccult Bld/Stl   7. Prostate cancer screening  - PSA  8. Bladder neck obstruction  - PSA  9. Screening for ischemic heart disease  - EKG 12-Lead  10. Screening for AAA (aortic abdominal aneurysm)  - [place], RETROPERITNL ABD,  LTD  11. Medication management  - Urinalysis, Routine w reflex microscopic - Microalbumin / creatinine urine ratio - CBC with Differential/Platelet - BASIC METABOLIC PANEL WITH GFR - Hepatic function panel - Magnesium - Lipid panel - TSH - Hemoglobin A1c - Insulin, random - VITAMIN D 25 Hydroxy        Patient was counseled in prudent diet, weight control to achieve/maintain BMI less than 25, BP monitoring, regular exercise and medications as discussed.  Discussed med effects and SE's. Routine screening labs and tests as requested with regular follow-up as recommended. Over 40 minutes of exam, counseling, chart review and high complex critical decision making was performed

## 2017-01-04 ENCOUNTER — Other Ambulatory Visit: Payer: Self-pay | Admitting: Internal Medicine

## 2017-01-04 DIAGNOSIS — M62838 Other muscle spasm: Secondary | ICD-10-CM

## 2017-01-04 LAB — MICROALBUMIN / CREATININE URINE RATIO
Creatinine, Urine: 190 mg/dL (ref 20–370)
Microalb Creat Ratio: 68 mcg/mg creat — ABNORMAL HIGH (ref <30)
Microalb, Ur: 13 mg/dL

## 2017-01-04 LAB — URINALYSIS, ROUTINE W REFLEX MICROSCOPIC
Bilirubin Urine: NEGATIVE
Glucose, UA: NEGATIVE
Hgb urine dipstick: NEGATIVE
Ketones, ur: NEGATIVE
Leukocytes, UA: NEGATIVE
Nitrite: NEGATIVE
Specific Gravity, Urine: 1.025 (ref 1.001–1.035)
pH: 5.5 (ref 5.0–8.0)

## 2017-01-04 LAB — LIPID PANEL
Cholesterol: 166 mg/dL (ref <200)
HDL: 41 mg/dL (ref >40)
LDL Cholesterol: 87 mg/dL (ref <100)
Total CHOL/HDL Ratio: 4 Ratio (ref <5.0)
Triglycerides: 188 mg/dL — ABNORMAL HIGH (ref <150)
VLDL: 38 mg/dL — ABNORMAL HIGH (ref <30)

## 2017-01-04 LAB — URINALYSIS, MICROSCOPIC ONLY
Bacteria, UA: NONE SEEN [HPF]
Casts: NONE SEEN [LPF]
Crystals: NONE SEEN [HPF]
Squamous Epithelial / LPF: NONE SEEN [HPF] (ref <=5)
Yeast: NONE SEEN [HPF]

## 2017-01-04 LAB — HEPATIC FUNCTION PANEL
ALT: 21 U/L (ref 9–46)
AST: 20 U/L (ref 10–35)
Albumin: 4 g/dL (ref 3.6–5.1)
Alkaline Phosphatase: 67 U/L (ref 40–115)
Bilirubin, Direct: 0.1 mg/dL (ref <=0.2)
Indirect Bilirubin: 0.3 mg/dL (ref 0.2–1.2)
Total Bilirubin: 0.4 mg/dL (ref 0.2–1.2)
Total Protein: 6.3 g/dL (ref 6.1–8.1)

## 2017-01-04 LAB — BASIC METABOLIC PANEL WITH GFR
BUN: 19 mg/dL (ref 7–25)
CO2: 26 mmol/L (ref 20–31)
Calcium: 9 mg/dL (ref 8.6–10.3)
Chloride: 105 mmol/L (ref 98–110)
Creat: 1.01 mg/dL (ref 0.70–1.11)
GFR, Est African American: 80 mL/min (ref >=60)
GFR, Est Non African American: 69 mL/min (ref >=60)
Glucose, Bld: 105 mg/dL — ABNORMAL HIGH (ref 65–99)
Potassium: 4.2 mmol/L (ref 3.5–5.3)
Sodium: 140 mmol/L (ref 135–146)

## 2017-01-04 LAB — HEMOGLOBIN A1C
Hgb A1c MFr Bld: 5.6 % (ref <5.7)
Mean Plasma Glucose: 114 mg/dL

## 2017-01-04 LAB — INSULIN, RANDOM: Insulin: 15.5 u[IU]/mL (ref 2.0–19.6)

## 2017-01-04 LAB — TSH: TSH: 0.49 mIU/L (ref 0.40–4.50)

## 2017-01-04 LAB — MAGNESIUM: Magnesium: 1.9 mg/dL (ref 1.5–2.5)

## 2017-01-04 LAB — PSA: PSA: 25.3 ng/mL — ABNORMAL HIGH (ref <=4.0)

## 2017-01-04 LAB — VITAMIN D 25 HYDROXY (VIT D DEFICIENCY, FRACTURES): Vit D, 25-Hydroxy: 86 ng/mL (ref 30–100)

## 2017-01-04 NOTE — Telephone Encounter (Signed)
Please call Diazepam 

## 2017-01-05 ENCOUNTER — Encounter: Payer: Self-pay | Admitting: *Deleted

## 2017-01-20 ENCOUNTER — Other Ambulatory Visit: Payer: Self-pay

## 2017-01-20 DIAGNOSIS — Z1212 Encounter for screening for malignant neoplasm of rectum: Secondary | ICD-10-CM

## 2017-01-20 LAB — POC HEMOCCULT BLD/STL (HOME/3-CARD/SCREEN)
Card #2 Fecal Occult Blod, POC: NEGATIVE
Card #3 Fecal Occult Blood, POC: NEGATIVE
Fecal Occult Blood, POC: NEGATIVE

## 2017-01-27 DIAGNOSIS — D692 Other nonthrombocytopenic purpura: Secondary | ICD-10-CM | POA: Diagnosis not present

## 2017-01-27 DIAGNOSIS — D1801 Hemangioma of skin and subcutaneous tissue: Secondary | ICD-10-CM | POA: Diagnosis not present

## 2017-01-27 DIAGNOSIS — L814 Other melanin hyperpigmentation: Secondary | ICD-10-CM | POA: Diagnosis not present

## 2017-01-27 DIAGNOSIS — L821 Other seborrheic keratosis: Secondary | ICD-10-CM | POA: Diagnosis not present

## 2017-01-27 DIAGNOSIS — Z85828 Personal history of other malignant neoplasm of skin: Secondary | ICD-10-CM | POA: Diagnosis not present

## 2017-01-27 DIAGNOSIS — L570 Actinic keratosis: Secondary | ICD-10-CM | POA: Diagnosis not present

## 2017-01-28 ENCOUNTER — Other Ambulatory Visit: Payer: Self-pay | Admitting: Internal Medicine

## 2017-01-28 DIAGNOSIS — I10 Essential (primary) hypertension: Secondary | ICD-10-CM

## 2017-02-14 ENCOUNTER — Other Ambulatory Visit: Payer: Self-pay | Admitting: Internal Medicine

## 2017-03-08 ENCOUNTER — Ambulatory Visit (INDEPENDENT_AMBULATORY_CARE_PROVIDER_SITE_OTHER): Payer: Self-pay | Admitting: Internal Medicine

## 2017-03-08 VITALS — BP 124/64 | HR 60 | Temp 97.3°F | Resp 16 | Ht 66.25 in | Wt 170.0 lb

## 2017-03-08 DIAGNOSIS — F988 Other specified behavioral and emotional disorders with onset usually occurring in childhood and adolescence: Secondary | ICD-10-CM

## 2017-03-08 DIAGNOSIS — F0280 Dementia in other diseases classified elsewhere without behavioral disturbance: Secondary | ICD-10-CM

## 2017-03-08 DIAGNOSIS — G301 Alzheimer's disease with late onset: Secondary | ICD-10-CM

## 2017-03-08 NOTE — Progress Notes (Signed)
    Patient has been seeing Dr Jaynee Eagles recently for a multifactorial gait disorder. Patient presents emergently today relating that his wife has diagnosed him with ADHD & Dementia and he would  like a referral to a neuropsychologist for testing  (he has a card with a name that he has misplaced!).     Patient is advised that his Neurologist, Dr Jaynee Eagles is a specialist and an expert in ADHD and evaluation of Dementia and treatment for both and he is advised that he should consult her and he is agreeable stating "I like her".    (No Charge OV)

## 2017-03-29 DIAGNOSIS — M25561 Pain in right knee: Secondary | ICD-10-CM | POA: Diagnosis not present

## 2017-03-29 DIAGNOSIS — M25562 Pain in left knee: Secondary | ICD-10-CM | POA: Diagnosis not present

## 2017-03-29 DIAGNOSIS — M170 Bilateral primary osteoarthritis of knee: Secondary | ICD-10-CM | POA: Diagnosis not present

## 2017-04-06 DIAGNOSIS — L570 Actinic keratosis: Secondary | ICD-10-CM | POA: Diagnosis not present

## 2017-04-12 ENCOUNTER — Encounter: Payer: Self-pay | Admitting: Internal Medicine

## 2017-06-07 NOTE — Progress Notes (Signed)
MEDICARE ANNUAL WELLNESS VISIT AND FOLLOW UP Assessment:   Nonsustained ventricular tachycardia (HCC) Monitor, continue meds  Essential hypertension - continue medications, DASH diet, exercise and monitor at home. Call if greater than 130/80.  -     CBC with Differential/Platelet -     BASIC METABOLIC PANEL WITH GFR -     Hepatic function panel -     TSH  Atherosclerosis of aorta (HCC) Control blood pressure, cholesterol, glucose, increase exercise.   Cardiomyopathy, ischemic Control blood pressure, cholesterol, glucose, increase exercise.   Coronary artery disease involving native coronary artery of native heart with other form of angina pectoris ([place]) Refill NTG Control blood pressure, cholesterol, glucose, increase exercise.   Basal cell carcinoma (BCC) of skin of left ear Follow up DERM  Other abnormal glucose -     Hemoglobin A1c Discussed disease progression and risks Discussed diet/exercise, weight management and risk modification A1C  Vitamin D deficiency Continue supplement  Medication management -     Magnesium  Recurrent major depressive disorder, in full remission ([place]) Depression - continue medications, stress management techniques discussed, increase water, good sleep hygiene discussed, increase exercise, and increase veggies.  Hyperlipidemia -continue medications, check lipids, decrease fatty foods, increase activity.  -     Lipid panel  BMI 26.74,  adult Monitor  LBBB (left bundle branch block) Monitor  Imbalance -     Ambulatory referral to Physical Therapy - back pain, imbalance, will refer to PT for balance/strength training.    Over 30 minutes of exam, counseling, chart review, and critical decision making was performed Future Appointments  Date Time Provider [place]  06/21/2017  1:00 PM [name] Age, MD [facility] None  07/21/2017  2:30 PM Unk Pinto, MD [facility] None  02/02/2018  3:00 PM Unk Pinto, MD [facility]  None    Plan:   During the course of the visit the patient was educated and counseled about appropriate screening and preventive services including:    Pneumococcal vaccine   Influenza vaccine  Prevnar 13  Td vaccine  Screening electrocardiogram  Colorectal cancer screening  Diabetes screening  Glaucoma screening  Nutrition counseling    Subjective:  Paul Frazier. is a 83 y.o. male who presents for Medicare Annual Wellness Visit and 3 month follow up for HTN, hyperlipidemia, prediabetes, and vitamin D Def.   His blood pressure has been controlled at home, today their BP is BP: 114/64 He does workout. He denies chest pain, shortness of breath, dizziness.  He has history of CABG 1986 and stent in 2001, neg heart cath in 2012, wants NTG refill but has not needed it. Does not follow up with cardiology.  He has history of imbalance, has been falling more lately, follows with Dr. Rexene Alberts but would like to do PT/balance therapy.  Patient has history of essential tremor and follows with Dr. Rexene Alberts.  He is on cholesterol medication and denies myalgias. His cholesterol is at goal. The cholesterol last visit was:   Lab Results  Component Value Date   CHOL 166 01/03/2017   HDL 41 01/03/2017   LDLCALC 87 01/03/2017   TRIG 188 (H) 01/03/2017   CHOLHDL 4.0 01/03/2017   He has been working on diet and exercise for prediabetes, and denies foot ulcerations, hyperglycemia, hypoglycemia , increased appetite, nausea, polydipsia, polyuria, visual disturbances, vomiting and weight loss. Last A1C in the office was:  Lab Results  Component Value Date   HGBA1C 5.6 01/03/2017   Last GFR Lab Results  Component Value Date   GFRNONAA 69 01/03/2017    Patient is on Vitamin D supplement.   Lab Results  Component Value Date   VD25OH 86 01/03/2017     BMI is Body mass index is 26.98 kg/m., he is working on diet and exercise. Wt Readings from Last 3 Encounters:  06/08/17 168 lb 6.4 oz  (76.4 kg)  03/08/17 170 lb (77.1 kg)  01/03/17 170 lb (77.1 kg)     Medication Review: Current Outpatient Medications on File Prior to Visit  Medication Sig Dispense Refill  . aspirin 81 MG tablet Take 81 mg by mouth daily.    [name] buPROPion (WELLBUTRIN XL) 300 MG 24 hr tablet TAKE ONE TABLET BY MOUTH ONCE DAILY FOR  MOOD 90 tablet 2  . co-enzyme Q-10 30 MG capsule Take 60 mg by mouth daily.    . Digestive Enzymes CAPS Take 1-2 capsules by mouth as needed (digestive function).     . Magnesium Chloride (MAGNESIUM DR PO) Take 1 tablet by mouth daily.     . metoprolol succinate (TOPROL-XL) 25 MG 24 hr tablet TAKE ONE TABLET BY MOUTH ONCE DAILY 90 tablet 1  . nitroGLYCERIN (NITROSTAT) 0.4 MG SL tablet Place 1 tablet (0.4 mg total) under the tongue every 5 (five) minutes as needed for chest pain. 25 tablet 3  . Specialty Vitamins Products (PROSTATE) TABS Take 2 tablets by mouth daily.      No current facility-administered medications on file prior to visit.     Current Problems (verified) Patient Active Problem List   Diagnosis Date Noted  . Atherosclerosis of aorta ([place]) 08/23/2016  . Basal cell carcinoma, ear 08/01/2015  . BMI 26.74,  adult 05/01/2015  . Major depression in full remission ([place]) 09/11/2014  . Essential hypertension 01/01/2014  . Vitamin D deficiency 01/01/2014  . Medication management 01/01/2014  . Other abnormal glucose 01/01/2014  . Nonsustained ventricular tachycardia ([place]) 05/23/2013  . CAD (coronary artery disease) 01/08/2013  . Cardiomyopathy, ischemic 01/08/2013  . Hyperlipidemia 01/08/2013  . LBBB (left bundle branch block) 01/08/2013    Screening Tests Immunization History  Administered Date(s) Administered  . Influenza, High Dose Seasonal PF 05/14/2016  . Pneumococcal Polysaccharide-23 10/16/2014  . Td 07/20/2011    Preventative care: Last colonoscopy: 2016  Prior vaccinations: TD or Tdap: 2013  Influenza:  Getting today Pneumococcal:  2016 Prevnar13: will get next OV Shingles: declines  Names of Other Physician/Practitioners you currently use: 1. [facility] here for primary care 2. Has not been recently , eye doctor 3. Dr. Stephanie Coup, dentist, getting partial done Patient Care Team: Unk Pinto, MD as PCP - General (Internal Medicine) [name] Age, MD as Attending Physician (Neurology) Sanda Klein, MD as Consulting Physician (Cardiology) Raynelle Bring, MD as Consulting Physician (Urology)  Allergies Allergies  Allergen Reactions  . Erythromycin Nausea Only    SURGICAL HISTORY He  has a past surgical history that includes Coronary artery bypass graft (1986); Coronary stent placement; Cholecystectomy (10/1990); Inguinal hernia repair (04/25/2012); US ECHOCARDIOGRAPHY (12/27/2008); and R/P Myoview (12/27/2008). FAMILY HISTORY His family history includes Cancer in his brother and father. SOCIAL HISTORY He  reports that he quit smoking about 46 years ago. he has never used smokeless tobacco. He reports that he does not drink alcohol or use drugs.  MEDICARE WELLNESS OBJECTIVES: Physical activity: Current Exercise Habits: Home exercise routine, Type of exercise: walking, Intensity: Mild Cardiac risk factors: Cardiac Risk Factors include: advanced age (>87men, >44 women);dyslipidemia;hypertension;male gender;sedentary lifestyle  Depression/mood screen:   Depression screen [facility] 2/9 06/08/2017  Decreased Interest 0  Down, Depressed, Hopeless 0  PHQ - 2 Score 0    ADLs:  In your present state of health, do you have any difficulty performing the following activities: 06/08/2017 01/03/2017  Hearing? N N  Vision? N N  Difficulty concentrating or making decisions? Y N  Walking or climbing stairs? Y N  Dressing or bathing? N N  Doing errands, shopping? N N  Some recent data might be hidden     Cognitive Testing  Alert? Yes  Normal Appearance?Yes  Oriented to person? Yes   Place? Yes   Time? Yes  Recall of three objects?  Yes  Can perform simple calculations? Yes  Displays appropriate judgment?Yes  Can read the correct time from a watch face?Yes  EOL planning: Does Patient Have a Medical Advance Directive?: Yes Type of Advance Directive: Living will Does patient want to make changes to medical advance directive?: No - Patient declined   Objective:   Today's Vitals   06/08/17 1618  BP: 114/64  Pulse: (!) 56  Resp: 16  Temp: (!) 97.5 F (36.4 C)  Weight: 168 lb 6.4 oz (76.4 kg)  Height: 5' 6.25" (1.683 m)  PainSc: 0-No pain   Body mass index is 26.98 kg/m.  General appearance: alert, no distress, WD/WN, male HEENT: normocephalic, sclerae anicteric, TMs pearly, nares patent, no discharge or erythema, pharynx normal Oral cavity: MMM, no lesions Neck: supple, no lymphadenopathy, no thyromegaly, no masses Heart: RRR, normal S1, S2, no murmurs Lungs: CTA bilaterally, no wheezes, rhonchi, or rales Abdomen: +bs, soft, non tender, non distended, no masses, no hepatomegaly, no splenomegaly Musculoskeletal: nontender, no swelling, no obvious deformity Extremities: no edema, no cyanosis, no clubbing Pulses: 2+ symmetric, upper and lower extremities, normal cap refill Neurological: alert, oriented x 3, CN2-12 intact, strength normal upper extremities and lower extremities, sensation normal throughout, DTRs 2+ throughout, no cerebellar signs, gait unsteady Psychiatric: normal affect, behavior normal, pleasant   Medicare Attestation I have personally reviewed: The patient's medical and social history Their use of alcohol, tobacco or illicit drugs Their current medications and supplements The patient's functional ability including ADLs,fall risks, home safety risks, cognitive, and hearing and visual impairment Diet and physical activities Evidence for depression or mood disorders  The patient's weight, height, BMI, and visual acuity have been recorded  in the chart.  I have made referrals, counseling, and provided education to the patient based on review of the above and I have provided the patient with a written personalized care plan for preventive services.     Vicie Mutters, PA-C   06/08/2017

## 2017-06-08 ENCOUNTER — Ambulatory Visit (INDEPENDENT_AMBULATORY_CARE_PROVIDER_SITE_OTHER): Payer: Medicare Other | Admitting: Physician Assistant

## 2017-06-08 ENCOUNTER — Encounter: Payer: Self-pay | Admitting: Physician Assistant

## 2017-06-08 VITALS — BP 114/64 | HR 56 | Temp 97.5°F | Resp 16 | Ht 66.25 in | Wt 168.4 lb

## 2017-06-08 DIAGNOSIS — I472 Ventricular tachycardia: Secondary | ICD-10-CM | POA: Diagnosis not present

## 2017-06-08 DIAGNOSIS — Z0001 Encounter for general adult medical examination with abnormal findings: Secondary | ICD-10-CM | POA: Diagnosis not present

## 2017-06-08 DIAGNOSIS — F3342 Major depressive disorder, recurrent, in full remission: Secondary | ICD-10-CM | POA: Diagnosis not present

## 2017-06-08 DIAGNOSIS — C44219 Basal cell carcinoma of skin of left ear and external auricular canal: Secondary | ICD-10-CM

## 2017-06-08 DIAGNOSIS — E782 Mixed hyperlipidemia: Secondary | ICD-10-CM | POA: Diagnosis not present

## 2017-06-08 DIAGNOSIS — I25118 Atherosclerotic heart disease of native coronary artery with other forms of angina pectoris: Secondary | ICD-10-CM

## 2017-06-08 DIAGNOSIS — I255 Ischemic cardiomyopathy: Secondary | ICD-10-CM | POA: Diagnosis not present

## 2017-06-08 DIAGNOSIS — Z23 Encounter for immunization: Secondary | ICD-10-CM

## 2017-06-08 DIAGNOSIS — E559 Vitamin D deficiency, unspecified: Secondary | ICD-10-CM

## 2017-06-08 DIAGNOSIS — Z79899 Other long term (current) drug therapy: Secondary | ICD-10-CM

## 2017-06-08 DIAGNOSIS — Z0000 Encounter for general adult medical examination without abnormal findings: Secondary | ICD-10-CM

## 2017-06-08 DIAGNOSIS — R6889 Other general symptoms and signs: Secondary | ICD-10-CM

## 2017-06-08 DIAGNOSIS — I10 Essential (primary) hypertension: Secondary | ICD-10-CM | POA: Diagnosis not present

## 2017-06-08 DIAGNOSIS — I700 Atherosclerosis of aorta: Secondary | ICD-10-CM | POA: Diagnosis not present

## 2017-06-08 DIAGNOSIS — Z6826 Body mass index (BMI) 26.0-26.9, adult: Secondary | ICD-10-CM | POA: Diagnosis not present

## 2017-06-08 DIAGNOSIS — I447 Left bundle-branch block, unspecified: Secondary | ICD-10-CM

## 2017-06-08 DIAGNOSIS — R7309 Other abnormal glucose: Secondary | ICD-10-CM | POA: Diagnosis not present

## 2017-06-08 DIAGNOSIS — I4729 Other ventricular tachycardia: Secondary | ICD-10-CM

## 2017-06-08 DIAGNOSIS — R2689 Other abnormalities of gait and mobility: Secondary | ICD-10-CM

## 2017-06-08 NOTE — Patient Instructions (Addendum)
Your A1C is a measure of your sugar over the past 3 months and is not affected by what you have eaten over the past few days. Diabetes increases your chances of stroke and heart attack over 300 % and is the leading cause of blindness and kidney failure in the [place]. Please make sure you decrease bad carbs like white bread, white rice, potatoes, corn, soft drinks, pasta, cereals, refined sugars, sweet tea, dried fruits, and fruit juice. Good carbs are okay to eat in moderation like sweet potatoes, brown rice, whole grain pasta/bread, most fruit (except dried fruit) and you can eat as many veggies as you want.   Greater than 6.5 is considered diabetic. Between 6.4 and 5.7 is prediabetic If your A1C is less than 5.7 you are NOT diabetic.  Targets for Glucose Readings: Time of Check Target for patients WITHOUT Diabetes Target for DIABETICS  Before Meals Less than 100  less than 150  Two hours after meals Less than 200  Less than 250      Bad carbs also include fruit juice, alcohol, and sweet tea. These are empty calories that do not signal to your brain that you are full.   Please remember the good carbs are still carbs which convert into sugar. So please measure them out no more than 1/2-1 cup of rice, oatmeal, pasta, and beans  Veggies are however free foods! Pile them on.   Not all fruit is created equal. Please see the list below, the fruit at the bottom is higher in sugars than the fruit at the top. Please avoid all dried fruits.     Use a dropper or use a cap to put peroxide, olive oil,mineral oil or canola oil in the effected ear- 2-3 times a week. Let it soak for 20-30 min then you can take a shower or use a baby bulb with warm water to wash out the ear wax.  Do not use Qtips  Memory Compensation Strategies  1. Use "WARM" strategy.  W= write it down  A= associate it  R= repeat it  M= make a mental note  2.   You can keep a Social worker.  Use a 3-ring notebook with  sections for the following: calendar, important names and phone numbers,  medications, doctors' names/phone numbers, lists/reminders, and a section to journal what you did  each day.   3.    Use a calendar to write appointments down.  4.    Write yourself a schedule for the day.  This can be placed on the calendar or in a separate section of the Memory Notebook.  Keeping a  regular schedule can help memory.  5.    Use medication organizer with sections for each day or morning/evening pills.  You may need help loading it  6.    Keep a basket, or pegboard by the door.  Place items that you need to take out with you in the basket or on the pegboard.  You may also want to  include a message board for reminders.  7.    Use sticky notes.  Place sticky notes with reminders in a place where the task is performed.  For example: " turn off the  stove" placed by the stove, "lock the door" placed on the door at eye level, " take your medications" on  the bathroom mirror or by the place where you normally take your medications.  8.    Use alarms/timers.  Use while cooking  to remind yourself to check on food or as a reminder to take your medicine, or as a  reminder to make a call, or as a reminder to perform another task, etc.

## 2017-06-09 LAB — HEPATIC FUNCTION PANEL
AG Ratio: 1.6 (calc) (ref 1.0–2.5)
ALT: 23 U/L (ref 9–46)
AST: 24 U/L (ref 10–35)
Albumin: 4.1 g/dL (ref 3.6–5.1)
Alkaline phosphatase (APISO): 55 U/L (ref 40–115)
Bilirubin, Direct: 0.1 mg/dL (ref 0.0–0.2)
Globulin: 2.5 g/dL (calc) (ref 1.9–3.7)
Indirect Bilirubin: 0.3 mg/dL (calc) (ref 0.2–1.2)
Total Bilirubin: 0.4 mg/dL (ref 0.2–1.2)
Total Protein: 6.6 g/dL (ref 6.1–8.1)

## 2017-06-09 LAB — LIPID PANEL
Cholesterol: 163 mg/dL (ref <200)
HDL: 46 mg/dL (ref >40)
LDL Cholesterol (Calc): 94 mg/dL (calc)
Non-HDL Cholesterol (Calc): 117 mg/dL (calc) (ref <130)
Total CHOL/HDL Ratio: 3.5 (calc) (ref <5.0)
Triglycerides: 134 mg/dL (ref <150)

## 2017-06-09 LAB — BASIC METABOLIC PANEL WITH GFR
BUN: 21 mg/dL (ref 7–25)
CO2: 29 mmol/L (ref 20–32)
Calcium: 9.3 mg/dL (ref 8.6–10.3)
Chloride: 105 mmol/L (ref 98–110)
Creat: 0.94 mg/dL (ref 0.70–1.11)
GFR, Est African American: 87 mL/min/{1.73_m2} (ref >=60)
GFR, Est Non African American: 75 mL/min/{1.73_m2} (ref >=60)
Glucose, Bld: 90 mg/dL (ref 65–99)
Potassium: 5 mmol/L (ref 3.5–5.3)
Sodium: 140 mmol/L (ref 135–146)

## 2017-06-09 LAB — CBC WITH DIFFERENTIAL/PLATELET
Basophils Absolute: 40 cells/uL (ref 0–200)
Basophils Relative: 0.5 %
Eosinophils Absolute: 142 cells/uL (ref 15–500)
Eosinophils Relative: 1.8 %
HCT: 45.6 % (ref 38.5–50.0)
Hemoglobin: 15.5 g/dL (ref 13.2–17.1)
Lymphs Abs: 2860 cells/uL (ref 850–3900)
MCH: 31.2 pg (ref 27.0–33.0)
MCHC: 34 g/dL (ref 32.0–36.0)
MCV: 91.8 fL (ref 80.0–100.0)
MPV: 10.8 fL (ref 7.5–12.5)
Monocytes Relative: 9.2 %
Neutro Abs: 4132 cells/uL (ref 1500–7800)
Neutrophils Relative %: 52.3 %
Platelets: 200 10*3/uL (ref 140–400)
RBC: 4.97 10*6/uL (ref 4.20–5.80)
RDW: 13 % (ref 11.0–15.0)
Total Lymphocyte: 36.2 %
WBC mixed population: 727 cells/uL (ref 200–950)
WBC: 7.9 10*3/uL (ref 3.8–10.8)

## 2017-06-09 LAB — HEMOGLOBIN A1C
Hgb A1c MFr Bld: 5.6 % of total Hgb (ref <5.7)
Mean Plasma Glucose: 114 (calc)
eAG (mmol/L): 6.3 (calc)

## 2017-06-09 LAB — MAGNESIUM: Magnesium: 2 mg/dL (ref 1.5–2.5)

## 2017-06-09 LAB — TSH: TSH: 0.5 mIU/L (ref 0.40–4.50)

## 2017-06-13 NOTE — Progress Notes (Signed)
Pt aware of lab results & voiced understanding of those results.

## 2017-06-13 NOTE — Progress Notes (Signed)
LVM for pt to return office call for LAB results.

## 2017-06-21 ENCOUNTER — Ambulatory Visit (INDEPENDENT_AMBULATORY_CARE_PROVIDER_SITE_OTHER): Payer: Medicare Other | Admitting: Neurology

## 2017-06-21 ENCOUNTER — Encounter: Payer: Self-pay | Admitting: Neurology

## 2017-06-21 DIAGNOSIS — I255 Ischemic cardiomyopathy: Secondary | ICD-10-CM | POA: Diagnosis not present

## 2017-06-21 DIAGNOSIS — R413 Other amnesia: Secondary | ICD-10-CM

## 2017-06-21 NOTE — Patient Instructions (Signed)
You have complaints of memory loss: memory loss or changes in cognitive function can have many reasons and does not always mean you have dementia. Conditions that can contribute to subjective or objective memory loss include: depression, stress, poor sleep from insomnia or sleep apnea, dehydration, fluctuation in blood sugar values, thyroid or electrolyte dysfunction and certain vitamin deficiencies. Dementia can be caused by stroke, brain atherosclerosis or brain vascular disease due to vascular risk factors (smoking, high blood pressure, high cholesterol, obesity and uncontrolled diabetes), certain degenerative brain disorders (including Parkinson's disease and Multiple sclerosis) and by Alzheimer's disease or other, more rare and sometimes hereditary causes.   We will do a brain scan, called MRI and call you with the test results. We will have to schedule you for this on a separate date. This test requires authorization from your insurance, and we will take care of the insurance process.  Your memory loss is rather mild at this point, which, of course is reassuring, likely in keeping with age-appropriate findings.  So long as your brain scan shows age-appropriate findings, I can see you back as needed.   Please eat nutritious food, try to drink enough water, about 6 to 8 cups per day, please try exercise on a regular basis, such as walking.

## 2017-06-21 NOTE — Progress Notes (Signed)
Subjective:    Patient ID: Paul Frazier. is a 83 y.o. male.  HPI     Interim history:   Mr. Paul Frazier is an 83 year old right-handed gentleman with an underlying medical history of vitamin D deficiency, hypertension, heart disease, status post CABG and stent placement, prediabetes, hyperlipidemia, and elevated PSA, who presents for a new problem visit for memory loss. The patient is unaccompanied today. I last saw him on 12/20/2016, and which time he felt stable, he had 2 falls, maybe her around March 2018. He had seen orthopedics. He was told he had spinal stenosis and was advised to consider ESI but he did not want to pursue this. He was using hemp tablets, which he reported was helpful. I suggested as needed follow-up.  Today, 06/21/2017: He reports having noticed issues with his memory. He has noticed some forgetfulness. He feels, that at times he is distracted. He admits that he does not always drink enough water and he does not exercise. He does most of the household chores. His mother had memory loss in her later years.   The patient's allergies, current medications, family history, past medical history, past social history, past surgical history and problem list were reviewed and updated as appropriate.    Previously (copied from previous notes for reference):   I saw him on 08/19/2016 for new problem visit, at which time he reported having had 2 falls in the past 6 months, was complaining of toe numbness in both feet. He has had some injections in his knees which she felt were helpful. He had injured his right arm and elbow secondary to fall and also fell in the bathroom but thankfully had no head injuries or loss of consciousness. He felt that his tremor was a little worse in his hands. He had stress particularly regarding his wife's health. He was not always hydrating well enough. I felt he had multifactorial gait disorder and suggested we proceed with EMG nerve conduction testing as  well as blood work. His blood test results were benign number called him with his test results. EMG and nerve conduction testing on 09/30/2016 showed: IMPRESSION:  Abnormal study demonstrating: - Findings suggest evidence for right greater than left lumbar (L5-S1) radiculopathies. This is due to abnormal bilateral motor nerve responses with normal sensory nerve responses, in the setting of abnormal right lumbar paraspinal and right gastrocnemius needle EMG findings.  - No evidence for large fiber sensory neuropathy.    We called him with his test results. Based on the test results I also recommended we proceed with a lumbar spine MRI. He was agreeable. He had a lumbar spine MRI without contrast on 10/26/2016, which I reviewed:  IMPRESSION:  Abnormal MRI lumbar spine (without) demonstrating: 1. At L3-4: disc bulging and facet hypertrophy with severe spinal stenosis and severe biforaminal stenosis  2. At L4-5: disc bulging and facet hypertrophy with moderate spinal stenosis and severe biforaminal stenosis 3. At L2-3: disc bulging and facet hypertrophy with moderate spinal stenosis and moderate biforaminal stenosis  4. Sacralization of L5 with transitional features. Correlate with plain film xrays if intervention or surgery is planned.  We called him with his test results.   I suggested referral to orthopedics and he agreed.    He had a head CT without contrast on 05/13/2015 which I reviewed: Interval resolution of left frontal subdural hematoma, no acute findings, mild atrophy and mild chronic small vessel ischemic changes. Of note, he has a history of fall in 2013  and known history of subdural hematoma.   I saw him on 11/13/2013, at which time he reported that the tremor was fluctuating. He had experienced intermittent vertigo. I did not suggest any medication changes for fear of causing his pulse to drop with increase in beta blocker and his balance to get worse with a trial of primidone.   I saw  him on 09/14/2012, at which time I kept him on low-dose metoprolol as he already had a low heart rate and I did not start him on primidone for fear of exacerbating his dizziness and balance problems and sedation, given his advanced age.  I first met him on 08/24/2012, which time he reported that he fell in December and hit his head. He was already on low-dose beta blocker because of his heart and I did not suggest that he increase it because his heart rate was already in the 50s. We talked about adding another medication down the Road but I did not suggest that we start him just yet. I did suggest a head CT based on his history of fall. He had a head CT on 09/06/2012 which showed a left frontal chronic subdural hematoma with mild midline shift but because he had moderate frontal atrophy there was not a whole other midline shift. I suggested that we repeat his head CT and we called him with his test results. He had a repeat head CT on 10/05/2012: Chronic left frontal subdural hematoma. Mild mass effect upon underlying frontal lobe. Minimal 2 mm of left to right midline shift. Compared to the prior CT from 09/06/12, there is slight reduction in size of the subdural hematoma.  He and his wife noted that his tremors have become worse since he had a heart attack last year. He has a prior history of coronary artery bypass surgery in the 80s and also had a stent placed in 2001. He has not noticed much in the way of difficulty with his posture. He does notice sometimes that his balance is not very good. Of note he has fallen recently before Christmas last year. He actually tripped over a piece of wire that was outside and landed on the ground which was cold and frozen at the time. He fell and hit his for head. He has not had a head CT. He reports no residual headaches are one-sided weakness, numbness, slurring of speech or droopy face. Nevertheless at the time he had a severe headache. He's had degenerative spine disease  and has had epidural injections to his neck. He's had some numbness and tingling radiating to his left index finger. He has seen a neurosurgeon who suggested surgery but left it up to him to decide. He has had epidural injection had his last injection in February 2012.  He has had some lightheadedness, and recently was told to stop his Prinivil. He has not fallen, but has had some close calls. He denies vertigo. Never had TIA or stroke symptoms, denying sudden onset of one sided weakness, numbness, tingling, slurring of speech or droopy face, hearing loss, tinnitus, diplopia or visual field cut or monocular loss of vision, and denies recurrent headaches.  His tremor affects both hands but is worse on the left. It is primarily a action tremor and his handwriting is impaired. 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 He was very active in his work and had to stop suddenly because of his heart attack. He felt even if he had not stop working because of  his heart he would've stopped because of his trembling. He has not noticed a lower extremity tremor. He does not report any stiffness or drooling. He does not report any memory difficulties or other cognitive impairment. He tries to walk regularly and his wife has not noticed any changes in his gait.  He had a brother who passed away at age 74, from cancer in his lymph nodes and he had PD. He recalls no other FHx of tremors. He fell off a ladder in 2009 and had surgery.   His Past Medical History Is Significant For: Past Medical History:  Diagnosis Date  . Arthritis   . CAD (coronary artery disease)   . Hyperlipidemia   . Hypertension   . Old myocardial infarct   . S/P CABG x 5 08/29/1984   LIMA to LAD,SVG to diagonal,SVG to OM,SVG to PDA and PLA  . Tremor, essential     His Past Surgical History Is Significant For: Past Surgical History:  Procedure Laterality Date  . CHOLECYSTECTOMY  10/1990  . CORONARY ARTERY BYPASS GRAFT  1986   x5 LIMA to  LAD,SVG to diagonal,SVG to OM,SVG to PDA and PLA  . CORONARY STENT PLACEMENT    . INGUINAL HERNIA REPAIR  04/25/2012   right- laparoscopic  . R/P Myoview  12/27/2008   no ischemia  . US ECHOCARDIOGRAPHY  12/27/2008   concentric LVH,mild to mod MR, TR and pulmonary hypertension. EF 45-50%    His Family History Is Significant For: Family History  Problem Relation Age of Onset  . Cancer Father        bone  . Cancer Brother        lymphnode    His Social History Is Significant For: Social History   Socioeconomic History  . Marital status: Married    Spouse name: [name]  . Number of children: 3  . Years of education: 15  . Highest education level: None  Social Needs  . Financial resource strain: None  . Food insecurity - worry: None  . Food insecurity - inability: None  . Transportation needs - medical: None  . Transportation needs - non-medical: None  Occupational History  . Occupation: Retired     Comment: retired  Tobacco Use  . Smoking status: Former Smoker    Last attempt to quit: 07/19/1970    Years since quitting: 46.9  . Smokeless tobacco: Never Used  Substance and Sexual Activity  . Alcohol use: No    Alcohol/week: 0.0 oz  . Drug use: No  . Sexual activity: None  Other Topics Concern  . None  Social History Narrative   Drinks 1-2 cups of coffee a day, occasional coke     His Allergies Are:  Allergies  Allergen Reactions  . Erythromycin Nausea Only  :   His Current Medications Are:  Outpatient Encounter Medications as of 06/21/2017  Medication Sig  . aspirin 81 MG tablet Take 81 mg by mouth daily.  [name] buPROPion (WELLBUTRIN XL) 300 MG 24 hr tablet TAKE ONE TABLET BY MOUTH ONCE DAILY FOR  MOOD  . co-enzyme Q-10 30 MG capsule Take 60 mg by mouth daily.  . Digestive Enzymes CAPS Take 1-2 capsules by mouth as needed (digestive function).   . Magnesium Chloride (MAGNESIUM DR PO) Take 1 tablet by mouth daily.   . metoprolol succinate (TOPROL-XL) 25 MG 24 hr  tablet TAKE ONE TABLET BY MOUTH ONCE DAILY  . nitroGLYCERIN (NITROSTAT) 0.4 MG SL tablet Place 1 tablet (0.4 mg  total) under the tongue every 5 (five) minutes as needed for chest pain.  [name] Specialty Vitamins Products (PROSTATE) TABS Take 2 tablets by mouth daily.    No facility-administered encounter medications on file as of 06/21/2017.   :  Review of Systems:  Out of a complete 14 point review of systems, all are reviewed and negative with the exception of these symptoms as listed below:  Review of Systems  Neurological:       Pt presents today to discuss his memory. He feels that his memory is slipping. Pt wants to know if he has ADD.    Objective:  Neurological Exam  Physical Exam Physical Examination:   Vitals:   06/21/17 1300  BP: 108/68  Pulse: 72   General Examination: The patient is a very pleasant 83 y.o. male in no acute distress. He appears well-developed and well-nourished and well groomed.   HEENT:Normocephalic, atraumatic, pupils are equal, round and reactive to light and accommodation, status post cataract repairs. Extraocular tracking is fairly good, no nystagmus is noted. Hearing is grossly intact. Speech is clear. He has a subtle lower lip and jaw tremor. Neck is supple with full range of motion.Oropharynx exam reveals:moderate mouth dryness, new partial dentures in place. Mallampati is class II. Tonsils are absent. Tongue protrudes centrally and palate elevates symmetrically.  Chest:Clear to auscultation without wheezing, rhonchi or crackles noted.  Heart:S1+S2+0, regular and normal without murmurs, rubs or gallops noted.   Abdomen:Soft, non-tender and non-distended with normal bowel sounds appreciated on auscultation.  Extremities:There is nopitting edema in the distal lower extremities bilaterally. Pedal pulses are intact.  Skin: Warm and dry without trophic changes noted. There are no varicose veins.  Musculoskeletal: exam reveals no obvious  joint deformities, tenderness or joint swelling or erythema, except chronic arthritic changes in both hands, reports no knee pain. No back pain.   Neurologically:  Mental status: The patient is awake, alert and oriented in all 4 spheres. Hisimmediate and remote memory, attention, language skills and fund of knowledge are appropriate. There is no evidence of aphasia, agnosia, apraxia or anomia. Speech is clear with normal prosody and enunciation. Thought process is linear. Mood is normaland affect is normal.   On 06/21/2017: MMSE 29/30 (missed one on delayed recall), CDT: 4/4, AFT: 17/min.   Cranial nerves II - XII are as described above under HEENT exam.  Motor exam: Normal bulk, strength for age and tone is noted. There is no drift, or rebound. No significant Mild intermittent resting tremor noted in both upper extremities particularly in the thumbs, stable. He has a mild postural and action tremor in both upper extremities, L more noticeable, stable. Romberg isnot testable safely.Reflexes are1+ in the upper extremities, trace in the knees and absent in both ankles. Fine motor skills and coordination: globally mildly impaired, stable. Sensory exam: intact to light touch.  Gait, station and balance: [place] with very mild difficulty and has to push himself up. Naturally he stands a little wide-based, but is able to stand narrow-based. He walks slightly cautiously and slowly, has preserved arm swing and turns in 3 steps. Tandem walk is not testable safely. He did bring his cane today.     Assessment and Plan:   In summary, Khiem Gargis a very pleasant 83 year old malewith an underlying medical history of a history of vitamin D deficiency, hypertension, heart disease, status post CABG and stent placement, prediabetes, hyperlipidemia,essential tremor, and history of elevated PSA, who presents for a new problem visit of memory  loss. His exam and memory scores are benign today. He is  reassured in that regard. This could be in keeping with age-appropriate memory loss. Nevertheless, we will proceed with a brain MRI without contrast. We will call him with his test results. He is advised to stay well hydrated with water, eat nutritious food and try to exercise on a regular basis. He admits that he could do better in that regard as well as with his water intake on a day-to-day basis. His exam is otherwise stable, in fact a little better as far as his gait disorder and joint pain are concerned. So long as his brain MRI results are in keeping with age-appropriate findings, I can see him back on an as-needed basis. We will call him with his test results. I answered all his questions today and he was in agreement.  I spent 25 minutes in total face-to-face time with the patient, more than 50% of which was spent in counseling and coordination of care, reviewing test results, reviewing medication and discussing or reviewing the diagnosis of memory loss, its prognosis and treatment options. Pertinent laboratory and imaging test results that were available during this visit with the patient were reviewed by me and considered in my medical decision making (see chart for details).

## 2017-06-30 ENCOUNTER — Inpatient Hospital Stay: Admission: RE | Admit: 2017-06-30 | Payer: Medicare Other | Source: Ambulatory Visit

## 2017-07-07 NOTE — Progress Notes (Signed)
CORRECTION / ADDENDUM: This EMG/NCS study was done on 09/30/16, not 10/06/16.  Penni Bombard, MD  Certified in Neurology, Neurophysiology and Neuroimaging [facility] [address] [place], [place] [address] [phone]

## 2017-07-15 ENCOUNTER — Ambulatory Visit
Admission: RE | Admit: 2017-07-15 | Discharge: 2017-07-15 | Disposition: A | Discharge status: Home / Self Care | Payer: Medicare Other | Source: Ambulatory Visit | Attending: Neurology | Admitting: Neurology

## 2017-07-15 DIAGNOSIS — R413 Other amnesia: Secondary | ICD-10-CM | POA: Diagnosis not present

## 2017-07-18 NOTE — Progress Notes (Signed)
MRI brain showed age-appropriate findings. He can FU as needed, as discussed.  [name] Age, MD, PhD [facility])

## 2017-07-20 ENCOUNTER — Telehealth: Payer: Self-pay

## 2017-07-20 NOTE — Telephone Encounter (Signed)
-----   Message from [name] Age, MD sent at 07/18/2017 10:19 AM EST ----- MRI brain showed age-appropriate findings. He can FU as needed, as discussed.  [name] Age, MD, PhD [facility])

## 2017-07-20 NOTE — Telephone Encounter (Signed)
I called pt. I advised him that his MRI showed age appropriate findings and he can follow up as needed. Pt asked that I mail him a copy of his MRI results. I verified with pt that the address we have on file is correct. Pt verbalized understanding of results. Pt had no questions at this time but was encouraged to call back if questions arise.

## 2017-07-21 ENCOUNTER — Ambulatory Visit: Payer: Self-pay | Admitting: Internal Medicine

## 2017-08-05 ENCOUNTER — Other Ambulatory Visit: Payer: Self-pay

## 2017-08-05 ENCOUNTER — Emergency Department (HOSPITAL_COMMUNITY)
Admission: EM | Admit: 2017-08-05 | Discharge: 2017-08-06 | Disposition: A | Discharge status: Home / Self Care | Payer: Medicare Other | Attending: Emergency Medicine | Admitting: Emergency Medicine

## 2017-08-05 ENCOUNTER — Encounter (HOSPITAL_COMMUNITY): Payer: Self-pay | Admitting: Emergency Medicine

## 2017-08-05 ENCOUNTER — Emergency Department (HOSPITAL_COMMUNITY): Payer: Medicare Other

## 2017-08-05 DIAGNOSIS — Z951 Presence of aortocoronary bypass graft: Secondary | ICD-10-CM | POA: Diagnosis not present

## 2017-08-05 DIAGNOSIS — I2510 Atherosclerotic heart disease of native coronary artery without angina pectoris: Secondary | ICD-10-CM | POA: Diagnosis not present

## 2017-08-05 DIAGNOSIS — S8002XA Contusion of left knee, initial encounter: Secondary | ICD-10-CM | POA: Diagnosis not present

## 2017-08-05 DIAGNOSIS — Y939 Activity, unspecified: Secondary | ICD-10-CM | POA: Insufficient documentation

## 2017-08-05 DIAGNOSIS — I10 Essential (primary) hypertension: Secondary | ICD-10-CM | POA: Insufficient documentation

## 2017-08-05 DIAGNOSIS — Z87891 Personal history of nicotine dependence: Secondary | ICD-10-CM | POA: Insufficient documentation

## 2017-08-05 DIAGNOSIS — Y92009 Unspecified place in unspecified non-institutional (private) residence as the place of occurrence of the external cause: Secondary | ICD-10-CM | POA: Diagnosis not present

## 2017-08-05 DIAGNOSIS — Y999 Unspecified external cause status: Secondary | ICD-10-CM | POA: Insufficient documentation

## 2017-08-05 DIAGNOSIS — W010XXA Fall on same level from slipping, tripping and stumbling without subsequent striking against object, initial encounter: Secondary | ICD-10-CM | POA: Diagnosis not present

## 2017-08-05 DIAGNOSIS — S51812A Laceration without foreign body of left forearm, initial encounter: Secondary | ICD-10-CM | POA: Diagnosis not present

## 2017-08-05 DIAGNOSIS — Z7982 Long term (current) use of aspirin: Secondary | ICD-10-CM | POA: Insufficient documentation

## 2017-08-05 DIAGNOSIS — Z79899 Other long term (current) drug therapy: Secondary | ICD-10-CM | POA: Insufficient documentation

## 2017-08-05 DIAGNOSIS — W19XXXA Unspecified fall, initial encounter: Secondary | ICD-10-CM

## 2017-08-05 DIAGNOSIS — S8992XA Unspecified injury of left lower leg, initial encounter: Secondary | ICD-10-CM | POA: Diagnosis not present

## 2017-08-05 NOTE — ED Triage Notes (Signed)
Patient states he fell and hit his left elbow and knee. Patient has a skin tears on left elbow and left knee.

## 2017-08-06 DIAGNOSIS — S51812A Laceration without foreign body of left forearm, initial encounter: Secondary | ICD-10-CM | POA: Diagnosis not present

## 2017-08-06 MED ORDER — CEPHALEXIN 500 MG PO CAPS
500.0000 mg | ORAL_CAPSULE | Freq: Once | ORAL | Status: AC
Start: 2017-08-06 — End: 2017-08-06
  Administered 2017-08-06: 500 mg via ORAL
  Filled 2017-08-06: qty 1

## 2017-08-06 MED ORDER — LIDOCAINE HCL (PF) 2 % IJ SOLN
10.0000 mL | Freq: Once | INTRAMUSCULAR | Status: AC
  Administered 2017-08-06: 10 mL
  Filled 2017-08-06: qty 10

## 2017-08-06 MED ORDER — CEPHALEXIN 500 MG PO CAPS: 500.0000 mg | ORAL_CAPSULE | Freq: Three times a day (TID) | ORAL | 0 refills | Status: DC

## 2017-08-06 NOTE — ED Provider Notes (Signed)
[facility] DEPT Provider Note   CSN: [id number] Arrival date & time: 08/05/17  2051     History   Chief Complaint Chief Complaint  Patient presents with  . Extremity Laceration  . Elbow Injury    HPI Paul Frazier. is a 84 y.o. male.   The history is provided by the patient.  He has history of hypertension, hyperlipidemia, coronary artery disease, major depression and comes in following a fall at home.  He states that he just lost his balance and fell suffering a laceration to his left elbow and skin to his left knee.  He denies head or neck injury.  He is not on any anticoagulants or antiplatelet agents other than low-dose aspirin.  Last tetanus immunization was 1 year ago.  Past Medical History:  Diagnosis Date  . Arthritis   . CAD (coronary artery disease)   . Hyperlipidemia   . Hypertension   . Old myocardial infarct   . S/P CABG x 5 08/29/1984   LIMA to LAD,SVG to diagonal,SVG to OM,SVG to PDA and PLA  . Tremor, essential     Patient Active Problem List   Diagnosis Date Noted  . Atherosclerosis of aorta ([place]) 08/23/2016  . Basal cell carcinoma, ear 08/01/2015  . BMI 26.74,  adult 05/01/2015  . Major depression in full remission ([place]) 09/11/2014  . Essential hypertension 01/01/2014  . Vitamin D deficiency 01/01/2014  . Medication management 01/01/2014  . Other abnormal glucose 01/01/2014  . Nonsustained ventricular tachycardia ([place]) 05/23/2013  . CAD (coronary artery disease) 01/08/2013  . Cardiomyopathy, ischemic 01/08/2013  . Hyperlipidemia 01/08/2013  . LBBB (left bundle branch block) 01/08/2013    Past Surgical History:  Procedure Laterality Date  . CHOLECYSTECTOMY  10/1990  . CORONARY ARTERY BYPASS GRAFT  1986   x5 LIMA to LAD,SVG to diagonal,SVG to OM,SVG to PDA and PLA  . CORONARY STENT PLACEMENT    . INGUINAL HERNIA REPAIR  04/25/2012   right- laparoscopic  . R/P Myoview  12/27/2008   no ischemia  . US  ECHOCARDIOGRAPHY  12/27/2008   concentric LVH,mild to mod MR, TR and pulmonary hypertension. EF 45-50%       Home Medications    Prior to Admission medications   Medication Sig Start Date End Date Taking? Authorizing Provider  aspirin 81 MG tablet Take 81 mg by mouth daily.   Yes [provider]  buPROPion (WELLBUTRIN XL) 300 MG 24 hr tablet TAKE ONE TABLET BY MOUTH ONCE DAILY FOR  MOOD 02/14/17  Yes Unk Pinto, MD  co-enzyme Q-10 30 MG capsule Take 60 mg by mouth daily.   Yes [provider]  Digestive Enzymes CAPS Take 1-2 capsules by mouth as needed (digestive function).    Yes [provider]  Magnesium Chloride (MAGNESIUM DR PO) Take 1 tablet by mouth daily.    Yes [provider]  metoprolol succinate (TOPROL-XL) 25 MG 24 hr tablet TAKE ONE TABLET BY MOUTH ONCE DAILY 01/28/17  Yes Unk Pinto, MD  Specialty Vitamins Products (PROSTATE) TABS Take 2 tablets by mouth daily.    Yes [provider]  nitroGLYCERIN (NITROSTAT) 0.4 MG SL tablet Place 1 tablet (0.4 mg total) under the tongue every 5 (five) minutes as needed for chest pain. 12/10/15   Croitoru, Dani Gobble, MD    Family History Family History  Problem Relation Age of Onset  . Cancer Father        bone  . Cancer Brother  lymphnode    Social History Social History   Tobacco Use  . Smoking status: Former Smoker    Last attempt to quit: 07/19/1970    Years since quitting: 47.0  . Smokeless tobacco: Never Used  Substance Use Topics  . Alcohol use: No    Alcohol/week: 0.0 oz  . Drug use: No     Allergies   Erythromycin   Review of Systems Review of Systems  All other systems reviewed and are negative.    Physical Exam Updated Vital Signs BP (!) 160/75 (BP Location: Right Arm)   Pulse 61   Temp 98 F (36.7 C) (Oral)   Resp 18   SpO2 100%   Physical Exam  Nursing note and vitals reviewed.  84 year old male, resting comfortably and in no acute  distress. Vital signs are significant for hypertension. Oxygen saturation is 100%, which is normal. Head is normocephalic and atraumatic. PERRLA, EOMI. Oropharynx is clear. Neck is nontender and supple without adenopathy or JVD. Back is nontender and there is no CVA tenderness. Lungs are clear without rales, wheezes, or rhonchi. Chest is nontender. Heart has regular rate and rhythm without murmur. Abdomen is soft, flat, nontender without masses or hepatosplenomegaly and peristalsis is normoactive. Extremities: Laceration of the proximal left forearm near the elbow.  This clearly goes into the subcutaneous tissue and is not a skin tear and will require suture closure.  Minor bruise noted to the anterior aspect of the left knee.  No significant swelling.  No instability.  No other extremity injury seen. Skin is warm and dry without rash. Neurologic: Mental status is normal, cranial nerves are intact, there are no motor or sensory deficits.  ED Treatments / Results   Radiology Dg Elbow Complete Left  Result Date: 08/05/2017 CLINICAL DATA:  Injury EXAM: LEFT ELBOW - COMPLETE 3+ VIEW COMPARISON:  None. FINDINGS: No acute displaced fracture or malalignment. No significant elbow effusion. Laceration posterior proximal forearm. IMPRESSION: No acute osseous abnormality Electronically Signed   By: Donavan Foil M.D.   On: 08/05/2017 23:34   Dg Knee Complete 4 Views Left  Result Date: 08/05/2017 CLINICAL DATA:  Injury EXAM: LEFT KNEE - COMPLETE 4+ VIEW COMPARISON:  None. FINDINGS: No acute displaced fracture or malalignment. Mild patellofemoral degenerative changes. Diffuse joint space calcifications. Mild medial and lateral degenerative changes. Surgical clips along the medial knee IMPRESSION: 1. No acute osseous abnormality 2. Chondrocalcinosis Electronically Signed   By: Donavan Foil M.D.   On: 08/05/2017 23:35    Procedures .Marland KitchenLaceration Repair Date/Time: 08/06/2017 3:08 AM Performed by: Delora Fuel, MD Authorized by: Delora Fuel, MD   Consent:    Consent obtained:  Verbal   Consent given by:  Patient   Risks discussed:  Infection, pain and retained foreign body   Alternatives discussed:  No treatment Anesthesia (see MAR for exact dosages):    Anesthesia method:  Local infiltration   Local anesthetic:  Lidocaine 2% w/o epi Laceration details:    Location:  Shoulder/arm   Shoulder/arm location:  L lower arm   Length (cm):  8   Depth (mm):  3 Repair type:    Repair type:  Simple Pre-procedure details:    Preparation:  Patient was prepped and draped in usual sterile fashion and imaging obtained to evaluate for foreign bodies Exploration:    Hemostasis achieved with:  Direct pressure   Wound exploration: wound explored through full range of motion and entire depth of wound probed and visualized  Wound extent: no foreign bodies/material noted and no muscle damage noted     Contaminated: no   Treatment:    Area cleansed with:  Shur-Clens   Amount of cleaning:  Standard Skin repair:    Repair method:  Sutures   Suture size:  4-0   Suture material:  Prolene   Suture technique:  Simple interrupted   Number of sutures:  8 Approximation:    Approximation:  Close Post-procedure details:    Dressing:  Antibiotic ointment and non-adherent dressing   Patient tolerance of procedure:  Tolerated well, no immediate complications   Medications Ordered in ED Medications  cephALEXin (KEFLEX) capsule 500 mg (not administered)  lidocaine (XYLOCAINE) 2 % injection 10 mL (10 mLs Infiltration Given by Other 08/06/17 0210)     Initial Impression / Assessment and Plan / ED Course  I have reviewed the triage vital signs and the nursing notes.  Pertinent labs & imaging results that were available during my care of the patient were reviewed by me and considered in my medical decision making (see chart for details).  Fall with laceration of the left elbow.  Old records are reviewed,  and he has no relevant past visits.  X-rays of left elbow and left knee are unremarkable.  Laceration is closed with sutures.  Advised to have sutures removed in 7-10 days.  He states that he fell on some potting soil and was worried the wound was contaminated.  Will place on prophylactic antibiotics for 3 days.  Final Clinical Impressions(s) / ED Diagnoses   Final diagnoses:  Fall at home, initial encounter  Laceration of left forearm, initial encounter  Contusion of left knee, initial encounter    ED Discharge Orders        Ordered    cephALEXin (KEFLEX) 500 MG capsule  3 times daily     01/11/93 8546      Delora Fuel, MD 27/03/50 [phone]

## 2017-08-09 DIAGNOSIS — M25561 Pain in right knee: Secondary | ICD-10-CM | POA: Diagnosis not present

## 2017-08-09 DIAGNOSIS — M170 Bilateral primary osteoarthritis of knee: Secondary | ICD-10-CM | POA: Diagnosis not present

## 2017-08-09 DIAGNOSIS — M25562 Pain in left knee: Secondary | ICD-10-CM | POA: Diagnosis not present

## 2017-08-10 ENCOUNTER — Other Ambulatory Visit: Payer: Self-pay | Admitting: Internal Medicine

## 2017-08-10 DIAGNOSIS — I10 Essential (primary) hypertension: Secondary | ICD-10-CM

## 2017-08-15 NOTE — Progress Notes (Signed)
Subjective:    Paul Frazier. is a 84 y.o. male who obtained a laceration 10 days ago, which required closure with 8 sutures. Mechanism of injury: Mechanical fall and fell against a flower pot. He denies pain, but there is redness and some mild serous drainage from wound. His last tetanus was 6 years ago. He denies residual pain to area. Has noted ongoing discharge and mild distal edema to upper extremity from compression bandage.   Xrays of R elbow and knee were obtained in the ER but never reviewed with the patient. This was done in office today.   Review of Systems Constitutional: negative for fatigue/malaise, fever/chills   Objective:    BP (!) 146/64   Pulse 63   Temp 97.9 F (36.6 C)   Wt 164 lb (74.4 kg)   SpO2 99%   BMI 26.47 kg/m  Injury exam:  A 8-10 cm laceration noted on the left posterior forearm that is healing well, with evidence of infection.  Some localized redness with warmth, some serous discharge.    Assessment:    Laceration is healing well, but with evidence of infection.    Plan:     1. 8 sutures were removed. 2. Keflex 500 mg TID x 5 days 2. Wound care discussed. 3. Follow up as needed.

## 2017-08-16 ENCOUNTER — Encounter: Payer: Self-pay | Admitting: Adult Health

## 2017-08-16 ENCOUNTER — Ambulatory Visit (INDEPENDENT_AMBULATORY_CARE_PROVIDER_SITE_OTHER): Payer: Medicare Other | Admitting: Adult Health

## 2017-08-16 VITALS — BP 146/64 | HR 63 | Temp 97.9°F | Wt 164.0 lb

## 2017-08-16 DIAGNOSIS — Z4802 Encounter for removal of sutures: Secondary | ICD-10-CM

## 2017-08-16 DIAGNOSIS — M25561 Pain in right knee: Secondary | ICD-10-CM | POA: Diagnosis not present

## 2017-08-16 DIAGNOSIS — M1711 Unilateral primary osteoarthritis, right knee: Secondary | ICD-10-CM | POA: Diagnosis not present

## 2017-08-16 DIAGNOSIS — S41112D Laceration without foreign body of left upper arm, subsequent encounter: Secondary | ICD-10-CM | POA: Diagnosis not present

## 2017-08-16 MED ORDER — NITROGLYCERIN 0.4 MG SL SUBL: 0.4000 mg | SUBLINGUAL_TABLET | SUBLINGUAL | 3 refills | Status: DC | PRN

## 2017-08-16 MED ORDER — CEPHALEXIN 500 MG PO CAPS: 500.0000 mg | ORAL_CAPSULE | Freq: Three times a day (TID) | ORAL | 0 refills | Status: DC

## 2017-08-16 NOTE — Patient Instructions (Signed)
Monitor for signs of worsening infection - increased redness, discharge, pain, hot to the touch - call me if this happens.  Leave uncovered when possible  Cover with nonadherent dressing and tape while still draining or to prevent rubbing against clothing.   Wound Care, Adult Taking care of your wound properly can help to prevent pain and infection. It can also help your wound to heal more quickly. How is this treated? Wound care  Follow instructions from your health care provider about how to take care of your wound. Make sure you: ? Wash your hands with soap and water before you change the bandage (dressing). If soap and water are not available, use hand sanitizer. ? Change your dressing as told by your health care provider. ? Leave stitches (sutures), skin glue, or adhesive strips in place. These skin closures may need to stay in place for 2 weeks or longer. If adhesive strip edges start to loosen and curl up, you may trim the loose edges. Do not remove adhesive strips completely unless your health care provider tells you to do that.  Check your wound area every day for signs of infection. Check for: ? More redness, swelling, or pain. ? More fluid or blood. ? Warmth. ? Pus or a bad smell.  Ask your health care provider if you should clean the wound with mild soap and water. Doing this may include: ? Using a clean towel to pat the wound dry after cleaning it. Do not rub or scrub the wound. ? Applying a cream or ointment. Do this only as told by your health care provider. ? Covering the incision with a clean dressing.  Ask your health care provider when you can leave the wound uncovered. Medicines   If you were prescribed an antibiotic medicine, cream, or ointment, take or use the antibiotic as told by your health care provider. Do not stop taking or using the antibiotic even if your condition improves.  Take over-the-counter and prescription medicines only as told by your health  care provider. If you were prescribed pain medicine, take it at least 30 minutes before doing any wound care or as told by your health care provider. General instructions  Return to your normal activities as told by your health care provider. Ask your health care provider what activities are safe.  Do not scratch or pick at the wound.  Keep all follow-up visits as told by your health care provider. This is important.  Eat a diet that includes protein, vitamin A, vitamin C, and other nutrient-rich foods. These help the wound heal: ? Protein-rich foods include meat, dairy, beans, nuts, and other sources. ? Vitamin A-rich foods include carrots and dark green, leafy vegetables. ? Vitamin C-rich foods include citrus, tomatoes, and other fruits and vegetables. ? Nutrient-rich foods have protein, carbohydrates, fat, vitamins, or minerals. Eat a variety of healthy foods including vegetables, fruits, and whole grains. Contact a health care provider if:  You received a tetanus shot and you have swelling, severe pain, redness, or bleeding at the injection site.  Your pain is not controlled with medicine.  You have more redness, swelling, or pain around the wound.  You have more fluid or blood coming from the wound.  Your wound feels warm to the touch.  You have pus or a bad smell coming from the wound.  You have a fever or chills.  You are nauseous or you vomit.  You are dizzy. Get help right away if:  You have  a red streak going away from your wound.  The edges of the wound open up and separate.  Your wound is bleeding and the bleeding does not stop with gentle pressure.  You have a rash.  You faint.  You have trouble breathing. This information is not intended to replace advice given to you by your health care provider. Make sure you discuss any questions you have with your health care provider. Document Released: 04/13/2008 Document Revised: 03/03/2016 Document Reviewed:  01/20/2016 Elsevier Interactive Patient Education  2017 [employer].

## 2017-08-16 NOTE — Addendum Note (Signed)
Addended by: Izora Ribas on: 08/16/2017 05:52 PM   Modules accepted: Orders

## 2017-08-22 DIAGNOSIS — M25562 Pain in left knee: Secondary | ICD-10-CM | POA: Diagnosis not present

## 2017-08-22 DIAGNOSIS — M1712 Unilateral primary osteoarthritis, left knee: Secondary | ICD-10-CM | POA: Diagnosis not present

## 2017-09-01 DIAGNOSIS — M1711 Unilateral primary osteoarthritis, right knee: Secondary | ICD-10-CM | POA: Diagnosis not present

## 2017-09-01 DIAGNOSIS — M25561 Pain in right knee: Secondary | ICD-10-CM | POA: Diagnosis not present

## 2017-09-07 DIAGNOSIS — M25562 Pain in left knee: Secondary | ICD-10-CM | POA: Diagnosis not present

## 2017-09-07 DIAGNOSIS — M1712 Unilateral primary osteoarthritis, left knee: Secondary | ICD-10-CM | POA: Diagnosis not present

## 2017-09-14 DIAGNOSIS — M25562 Pain in left knee: Secondary | ICD-10-CM | POA: Diagnosis not present

## 2017-09-14 DIAGNOSIS — M25561 Pain in right knee: Secondary | ICD-10-CM | POA: Diagnosis not present

## 2017-09-14 DIAGNOSIS — M170 Bilateral primary osteoarthritis of knee: Secondary | ICD-10-CM | POA: Diagnosis not present

## 2017-09-15 ENCOUNTER — Ambulatory Visit: Payer: Self-pay | Admitting: Internal Medicine

## 2017-09-27 ENCOUNTER — Ambulatory Visit (INDEPENDENT_AMBULATORY_CARE_PROVIDER_SITE_OTHER): Payer: Medicare Other | Admitting: Internal Medicine

## 2017-09-27 ENCOUNTER — Encounter: Payer: Self-pay | Admitting: Internal Medicine

## 2017-09-27 VITALS — BP 118/60 | HR 64 | Temp 97.3°F | Resp 16 | Ht 66.0 in | Wt 163.2 lb

## 2017-09-27 DIAGNOSIS — R7303 Prediabetes: Secondary | ICD-10-CM

## 2017-09-27 DIAGNOSIS — R7309 Other abnormal glucose: Secondary | ICD-10-CM

## 2017-09-27 DIAGNOSIS — I10 Essential (primary) hypertension: Secondary | ICD-10-CM | POA: Diagnosis not present

## 2017-09-27 DIAGNOSIS — Z79899 Other long term (current) drug therapy: Secondary | ICD-10-CM | POA: Diagnosis not present

## 2017-09-27 DIAGNOSIS — G629 Polyneuropathy, unspecified: Secondary | ICD-10-CM

## 2017-09-27 DIAGNOSIS — G608 Other hereditary and idiopathic neuropathies: Secondary | ICD-10-CM

## 2017-09-27 DIAGNOSIS — E782 Mixed hyperlipidemia: Secondary | ICD-10-CM

## 2017-09-27 DIAGNOSIS — I25118 Atherosclerotic heart disease of native coronary artery with other forms of angina pectoris: Secondary | ICD-10-CM

## 2017-09-27 DIAGNOSIS — E559 Vitamin D deficiency, unspecified: Secondary | ICD-10-CM | POA: Diagnosis not present

## 2017-09-27 MED ORDER — GABAPENTIN 100 MG PO CAPS: ORAL_CAPSULE | ORAL | 2 refills | Status: DC

## 2017-09-27 NOTE — Patient Instructions (Signed)

## 2017-09-27 NOTE — Progress Notes (Signed)
This very nice 84 y.o. MWM presents for 3 month follow up with HTN, ASHD, HLD, Pre-Diabetes and Vitamin D Deficiency.  Has hx/o peripheral sensory neuropathy affecting gait & is c/o pai in feet & legs.      Patient is treated for HTN (1980's) & BP has been controlled at home. Today's BP is at goal -  118/60. In 1986, he underwent CABG and then in 2001 had PCA/stenting.  Heart cath in 2012 was reported negative. Patient has had no complaints of any cardiac type chest pain, palpitations, dyspnea / orthopnea / PND, dizziness, claudication, or dependent edema.     Hyperlipidemia is controlled with diet & meds. Patient denies myalgias or other med SE's. Last Lipids were at goal: Lab Results  Component Value Date   CHOL 163 06/08/2017   HDL 46 06/08/2017   LDLCALC 94 06/08/2017   TRIG 134 06/08/2017   CHOLHDL 3.5 06/08/2017      Also, the patient has history of PreDiabetes (A1c 5.8%/2013)  and has had no symptoms of reactive hypoglycemia, diabetic polys, paresthesias or visual blurring.  Last A1c was Normal & at goal: Lab Results  Component Value Date   HGBA1C 5.6 06/08/2017      Further, the patient also has history of Vitamin D Deficiency ("40" on treatment in 2011)  and supplements vitamin D without any suspected side-effects. Last vitamin D was at goal:   Lab Results  Component Value Date   VD25OH 86 01/03/2017   Current Outpatient Medications on File Prior to Visit  Medication Sig  . aspirin 81 MG tablet Take 81 mg by mouth daily.  [name] buPROPion (WELLBUTRIN XL) 300 MG 24 hr tablet TAKE ONE TABLET BY MOUTH ONCE DAILY FOR  MOOD  . co-enzyme Q-10 30 MG capsule Take 60 mg by mouth daily.  . Digestive Enzymes CAPS Take 1-2 capsules by mouth as needed (digestive function).   . Magnesium Chloride (MAGNESIUM DR PO) Take 2 tablets by mouth daily.   . metoprolol succinate (TOPROL-XL) 25 MG 24 hr tablet TAKE 1 TABLET BY MOUTH ONCE DAILY  . nitroGLYCERIN (NITROSTAT) 0.4 MG SL tablet Place 1  tablet (0.4 mg total) under the tongue every 5 (five) minutes as needed for chest pain.  [name] Specialty Vitamins Products (PROSTATE) TABS Take 2 tablets by mouth daily.    No current facility-administered medications on file prior to visit.    Allergies  Allergen Reactions  . Erythromycin Nausea Only   PMHx:   Past Medical History:  Diagnosis Date  . Arthritis   . CAD (coronary artery disease)   . Hyperlipidemia   . Hypertension   . Old myocardial infarct   . S/P CABG x 5 08/29/1984   LIMA to LAD,SVG to diagonal,SVG to OM,SVG to PDA and PLA  . Tremor, essential    Immunization History  Administered Date(s) Administered  . Influenza, High Dose Seasonal PF 05/14/2016, 06/08/2017  . Pneumococcal Polysaccharide-23 10/16/2014  . Td 07/20/2011   Past Surgical History:  Procedure Laterality Date  . CHOLECYSTECTOMY  10/1990  . CORONARY ARTERY BYPASS GRAFT  1986   x5 LIMA to LAD,SVG to diagonal,SVG to OM,SVG to PDA and PLA  . CORONARY STENT PLACEMENT    . INGUINAL HERNIA REPAIR  04/25/2012   right- laparoscopic  . R/P Myoview  12/27/2008   no ischemia  . US ECHOCARDIOGRAPHY  12/27/2008   concentric LVH,mild to mod MR, TR and pulmonary hypertension. EF 45-50%   FHx:  Reviewed / unchanged  SHx:    Reviewed / unchanged  Systems Review:  Constitutional: Denies fever, chills, wt changes, headaches, insomnia, fatigue, night sweats, change in appetite. Eyes: Denies redness, blurred vision, diplopia, discharge, itchy, watery eyes.  ENT: Denies discharge, congestion, post nasal drip, epistaxis, sore throat, earache, hearing loss, dental pain, tinnitus, vertigo, sinus pain, snoring.  CV: Denies chest pain, palpitations, irregular heartbeat, syncope, dyspnea, diaphoresis, orthopnea, PND, claudication or edema. Respiratory: denies cough, dyspnea, DOE, pleurisy, hoarseness, laryngitis, wheezing.  Gastrointestinal: Denies dysphagia, odynophagia, heartburn, reflux, water brash, abdominal pain or  cramps, nausea, vomiting, bloating, diarrhea, constipation, hematemesis, melena, hematochezia  or hemorrhoids. Genitourinary: Denies dysuria, frequency, urgency, nocturia, hesitancy, discharge, hematuria or flank pain. Musculoskeletal: Denies arthralgias, myalgias, stiffness, jt. swelling, pain, limping or strain/sprain.  Skin: Denies pruritus, rash, hives, warts, acne, eczema or change in skin lesion(s). Neuro: No weakness, tremor, incoordination, spasms, paresthesia or pain. Psychiatric: Denies confusion, memory loss or sensory loss. Endo: Denies change in weight, skin or hair change.  Heme/Lymph: No excessive bleeding, bruising or enlarged lymph nodes.  Physical Exam  BP 118/60   Pulse 64   Temp (!) 97.3 F (36.3 C)   Resp 16   Ht 5\' 6"  (1.676 m)   Wt 163 lb 3.2 oz (74 kg)   BMI 26.34 kg/m   Appears  well nourished, well groomed  and in no distress.  Eyes: PERRLA, EOMs, conjunctiva no swelling or erythema. Sinuses: No frontal/maxillary tenderness ENT/Mouth: EAC's clear, TM's nl w/o erythema, bulging. Nares clear w/o erythema, swelling, exudates. Oropharynx clear without erythema or exudates. Oral hygiene is good. Tongue normal, non obstructing. Hearing intact.  Neck: Supple. Thyroid not palpable. Car 2+/2+ without bruits, nodes or JVD. Chest: Respirations nl with BS clear & equal w/o rales, rhonchi, wheezing or stridor.  Cor: Heart sounds normal w/ regular rate and rhythm without sig. murmurs, gallops, clicks or rubs. Peripheral pulses normal and equal  without edema.  Abdomen: Soft & bowel sounds normal. Non-tender w/o guarding, rebound, hernias, masses or organomegaly.  Lymphatics: Unremarkable.  Musculoskeletal: Full ROM all peripheral extremities, joint stability, 5/5 strength and normal gait.  Skin: Warm, dry without exposed rashes, lesions or ecchymosis apparent.  Neuro: Cranial nerves intact, reflexes equal bilaterally. Motor testing grossly intact. Sensation sl decreased  to touch, vibratory and Monofilament in a stocking distribution bilaterally.Tendon reflexes grossly intact.  Pysch: Alert & oriented x 3.  Insight and judgement nl & appropriate. No ideations.  Assessment and Plan:  1. Essential hypertension  - Continue medication, monitor blood pressure at home.  - Continue DASH diet. Reminder to go to the ER if any CP,  SOB, nausea, dizziness, severe HA, changes vision/speech.  - CBC with Differential/Platelet - BASIC METABOLIC PANEL WITH GFR - Magnesium - TSH  2. Hyperlipidemia, mixed  - Continue diet/meds, exercise,& lifestyle modifications.  - Continue monitor periodic cholesterol/liver & renal functions   - Hepatic function panel - Lipid panel - TSH  3. Abnormal glucose  - Hemoglobin A1c - Insulin, random  4. Vitamin D deficiency  - Continue diet, exercise, lifestyle modifications.  - Monitor appropriate labs. - Continue supplementation.  - VITAMIN D 25 Hydroxyl  5. Prediabetes  - Hemoglobin A1c - Insulin, random  6. Coronary artery disease  ([facility])  - Lipid panel  7. Medication management  - CBC with Differential/Platelet - BASIC METABOLIC PANEL WITH GFR - Hepatic function panel - Magnesium - Lipid panel - TSH - Hemoglobin A1c - Insulin, random - VITAMIN D  25 Hydroxyl  8. Peripheral Sensory Neuropathy.      -Rx Gabapentin 100 mg -  1 cap 2-3 x/ day for pain.       Discussed  regular exercise, BP monitoring, weight control to achieve/maintain BMI less than 25 and discussed med and SE's. Recommended labs to assess and monitor clinical status with further disposition pending results of labs. Over 30 minutes of exam, counseling, chart review was performed.

## 2017-09-28 LAB — CBC WITH DIFFERENTIAL/PLATELET
Basophils Absolute: 41 cells/uL (ref 0–200)
Basophils Relative: 0.5 %
Eosinophils Absolute: 170 cells/uL (ref 15–500)
Eosinophils Relative: 2.1 %
HCT: 44.2 % (ref 38.5–50.0)
Hemoglobin: 15.5 g/dL (ref 13.2–17.1)
Lymphs Abs: 2754 cells/uL (ref 850–3900)
MCH: 32 pg (ref 27.0–33.0)
MCHC: 35.1 g/dL (ref 32.0–36.0)
MCV: 91.3 fL (ref 80.0–100.0)
MPV: 10.8 fL (ref 7.5–12.5)
Monocytes Relative: 10.3 %
Neutro Abs: 4301 cells/uL (ref 1500–7800)
Neutrophils Relative %: 53.1 %
Platelets: 222 10*3/uL (ref 140–400)
RBC: 4.84 10*6/uL (ref 4.20–5.80)
RDW: 12.7 % (ref 11.0–15.0)
Total Lymphocyte: 34 %
WBC mixed population: 834 cells/uL (ref 200–950)
WBC: 8.1 10*3/uL (ref 3.8–10.8)

## 2017-09-28 LAB — HEPATIC FUNCTION PANEL
AG Ratio: 1.8 (calc) (ref 1.0–2.5)
ALT: 24 U/L (ref 9–46)
AST: 25 U/L (ref 10–35)
Albumin: 4.3 g/dL (ref 3.6–5.1)
Alkaline phosphatase (APISO): 62 U/L (ref 40–115)
Bilirubin, Direct: 0.1 mg/dL (ref 0.0–0.2)
Globulin: 2.4 g/dL (calc) (ref 1.9–3.7)
Indirect Bilirubin: 0.3 mg/dL (calc) (ref 0.2–1.2)
Total Bilirubin: 0.4 mg/dL (ref 0.2–1.2)
Total Protein: 6.7 g/dL (ref 6.1–8.1)

## 2017-09-28 LAB — VITAMIN D 25 HYDROXY (VIT D DEFICIENCY, FRACTURES): Vit D, 25-Hydroxy: 60 ng/mL (ref 30–100)

## 2017-09-28 LAB — BASIC METABOLIC PANEL WITH GFR
BUN: 25 mg/dL (ref 7–25)
CO2: 30 mmol/L (ref 20–32)
Calcium: 9.5 mg/dL (ref 8.6–10.3)
Chloride: 105 mmol/L (ref 98–110)
Creat: 1 mg/dL (ref 0.70–1.11)
GFR, Est African American: 80 mL/min/{1.73_m2} (ref >=60)
GFR, Est Non African American: 69 mL/min/{1.73_m2} (ref >=60)
Glucose, Bld: 113 mg/dL — ABNORMAL HIGH (ref 65–99)
Potassium: 4 mmol/L (ref 3.5–5.3)
Sodium: 141 mmol/L (ref 135–146)

## 2017-09-28 LAB — LIPID PANEL
Cholesterol: 178 mg/dL (ref <200)
HDL: 46 mg/dL (ref >40)
LDL Cholesterol (Calc): 98 mg/dL (calc)
Non-HDL Cholesterol (Calc): 132 mg/dL (calc) — ABNORMAL HIGH (ref <130)
Total CHOL/HDL Ratio: 3.9 (calc) (ref <5.0)
Triglycerides: 225 mg/dL — ABNORMAL HIGH (ref <150)

## 2017-09-28 LAB — HEMOGLOBIN A1C
Hgb A1c MFr Bld: 5.5 % of total Hgb (ref <5.7)
Mean Plasma Glucose: 111 (calc)
eAG (mmol/L): 6.2 (calc)

## 2017-09-28 LAB — TSH: TSH: 0.35 mIU/L — ABNORMAL LOW (ref 0.40–4.50)

## 2017-09-28 LAB — INSULIN, RANDOM: Insulin: 14 u[IU]/mL (ref 2.0–19.6)

## 2017-09-28 LAB — MAGNESIUM: Magnesium: 2 mg/dL (ref 1.5–2.5)

## 2017-09-30 ENCOUNTER — Encounter: Payer: Self-pay | Admitting: *Deleted

## 2017-11-22 DIAGNOSIS — L570 Actinic keratosis: Secondary | ICD-10-CM | POA: Diagnosis not present

## 2017-11-22 DIAGNOSIS — Z85828 Personal history of other malignant neoplasm of skin: Secondary | ICD-10-CM | POA: Diagnosis not present

## 2017-11-22 DIAGNOSIS — L821 Other seborrheic keratosis: Secondary | ICD-10-CM | POA: Diagnosis not present

## 2017-11-22 DIAGNOSIS — L820 Inflamed seborrheic keratosis: Secondary | ICD-10-CM | POA: Diagnosis not present

## 2017-12-01 ENCOUNTER — Other Ambulatory Visit: Payer: Self-pay | Admitting: Internal Medicine

## 2017-12-01 ENCOUNTER — Other Ambulatory Visit: Payer: Self-pay | Admitting: Physician Assistant

## 2017-12-01 DIAGNOSIS — I10 Essential (primary) hypertension: Secondary | ICD-10-CM

## 2017-12-01 DIAGNOSIS — M62838 Other muscle spasm: Secondary | ICD-10-CM

## 2017-12-01 MED ORDER — DIAZEPAM 5 MG PO TABS: ORAL_TABLET | ORAL | 0 refills | Status: DC

## 2017-12-01 MED ORDER — BUPROPION HCL ER (XL) 300 MG PO TB24: ORAL_TABLET | ORAL | 1 refills | Status: DC

## 2017-12-19 ENCOUNTER — Encounter: Payer: Self-pay | Admitting: Internal Medicine

## 2018-01-03 DIAGNOSIS — M170 Bilateral primary osteoarthritis of knee: Secondary | ICD-10-CM | POA: Diagnosis not present

## 2018-01-03 DIAGNOSIS — M25562 Pain in left knee: Secondary | ICD-10-CM | POA: Diagnosis not present

## 2018-01-03 DIAGNOSIS — M1712 Unilateral primary osteoarthritis, left knee: Secondary | ICD-10-CM | POA: Diagnosis not present

## 2018-02-02 ENCOUNTER — Ambulatory Visit (INDEPENDENT_AMBULATORY_CARE_PROVIDER_SITE_OTHER): Payer: Medicare Other | Admitting: Internal Medicine

## 2018-02-02 ENCOUNTER — Encounter: Payer: Self-pay | Admitting: Internal Medicine

## 2018-02-02 VITALS — BP 118/74 | HR 63 | Temp 97.6°F | Resp 16 | Ht 66.0 in | Wt 160.6 lb

## 2018-02-02 DIAGNOSIS — R7309 Other abnormal glucose: Secondary | ICD-10-CM | POA: Diagnosis not present

## 2018-02-02 DIAGNOSIS — N138 Other obstructive and reflux uropathy: Secondary | ICD-10-CM | POA: Diagnosis not present

## 2018-02-02 DIAGNOSIS — I700 Atherosclerosis of aorta: Secondary | ICD-10-CM | POA: Diagnosis not present

## 2018-02-02 DIAGNOSIS — I10 Essential (primary) hypertension: Secondary | ICD-10-CM

## 2018-02-02 DIAGNOSIS — R7303 Prediabetes: Secondary | ICD-10-CM

## 2018-02-02 DIAGNOSIS — Z79899 Other long term (current) drug therapy: Secondary | ICD-10-CM | POA: Diagnosis not present

## 2018-02-02 DIAGNOSIS — I25118 Atherosclerotic heart disease of native coronary artery with other forms of angina pectoris: Secondary | ICD-10-CM

## 2018-02-02 DIAGNOSIS — Z136 Encounter for screening for cardiovascular disorders: Secondary | ICD-10-CM

## 2018-02-02 DIAGNOSIS — Z125 Encounter for screening for malignant neoplasm of prostate: Secondary | ICD-10-CM | POA: Diagnosis not present

## 2018-02-02 DIAGNOSIS — E559 Vitamin D deficiency, unspecified: Secondary | ICD-10-CM | POA: Diagnosis not present

## 2018-02-02 DIAGNOSIS — Z1212 Encounter for screening for malignant neoplasm of rectum: Secondary | ICD-10-CM

## 2018-02-02 DIAGNOSIS — Z1211 Encounter for screening for malignant neoplasm of colon: Secondary | ICD-10-CM

## 2018-02-02 DIAGNOSIS — N401 Enlarged prostate with lower urinary tract symptoms: Secondary | ICD-10-CM

## 2018-02-02 DIAGNOSIS — E782 Mixed hyperlipidemia: Secondary | ICD-10-CM | POA: Diagnosis not present

## 2018-02-02 DIAGNOSIS — Z8249 Family history of ischemic heart disease and other diseases of the circulatory system: Secondary | ICD-10-CM

## 2018-02-02 DIAGNOSIS — Z87891 Personal history of nicotine dependence: Secondary | ICD-10-CM

## 2018-02-02 NOTE — Patient Instructions (Signed)

## 2018-02-02 NOTE — Progress Notes (Signed)
[facility]   Unk Pinto, M.D.     Paul Frazier. Paul Frazier, P.A.-C Liane Comber, [place]                [address] [place], [place] [address] Telephone [phone] Telefax [phone]  Comprehensive Evaluation & Examination     This very nice 84 y.o. MWM presents for a comprehensive evaluation and management of multiple medical co-morbidities.  Patient has been followed for HTN, ASHD, HLD, T2_NIDDM  Prediabetes and Vitamin D Deficiency.     HTN predates since the 1980's. Patient's BP has been controlled at home.  Today's BP is at goal - 118/74.  Patient has hx/o ASCAD undergoing CABG (1986), PCA/Stenting (2001) and more recently had a Negative Heart Cath in 2012. Patient denies any cardiac symptoms as chest pain, palpitations, shortness of breath, dizziness or ankle swelling.     Patient's hyperlipidemia is controlled with diet and medications. Patient denies myalgias or other medication SE's. Last lipids were at goal albeit elevated Trig's: Lab Results  Component Value Date   CHOL 178 09/27/2017   HDL 46 09/27/2017   LDLCALC 98 09/27/2017   TRIG 225 (H) 09/27/2017   CHOLHDL 3.9 09/27/2017      Patient has prediabetes since (A1c 5.8% in 2013)  and patient denies reactive hypoglycemic symptoms, visual blurring, diabetic polys or paresthesias. Last A1c was Normal & at goal: Lab Results  Component Value Date   HGBA1C 5.5 09/27/2017       Finally, patient has history of Vitamin D Deficiency ("40" in 2011)  and last vitamin D was at goal: Lab Results  Component Value Date   VD25OH 60 09/27/2017   Current Outpatient Medications on File Prior to Visit  Medication Sig  . aspirin 81 MG tablet Take 81 mg by mouth daily.  [name] buPROPion (WELLBUTRIN XL) 300 MG 24 hr tablet Take 1 tablet daily for Mood a  . co-enzyme Q-10 30 MG capsule Take 60 mg by mouth daily.  . diazepam (VALIUM) 5 MG tablet  Take 1/2 to 1 tablet 2 to 3 x / day only if needed for Muscle spasms  & please try to limit to 5 days /week to avoid addiction  . Digestive Enzymes CAPS Take 1-2 capsules by mouth as needed (digestive function).   . Magnesium Chloride (MAGNESIUM DR PO) Take 2 tablets by mouth daily.   . metoprolol succinate (TOPROL-XL) 25 MG 24 hr tablet TAKE 1 TABLET BY MOUTH ONCE DAILY  . nitroGLYCERIN (NITROSTAT) 0.4 MG SL tablet Place 1 tablet (0.4 mg total) under the tongue every 5 (five) minutes as needed for chest pain.  [name] Specialty Vitamins Products (PROSTATE) TABS Take 2 tablets by mouth daily.    No current facility-administered medications on file prior to visit.    Allergies  Allergen Reactions  . Erythromycin Nausea Only   Past Medical History:  Diagnosis Date  . Arthritis   . CAD (coronary artery disease)   . Hyperlipidemia   . Hypertension   . Old myocardial infarct   . S/P CABG x 5 08/29/1984   LIMA to LAD,SVG to diagonal,SVG to OM,SVG to PDA and PLA  . Tremor, essential    Health Maintenance  Topic Date Due  . PNA vac Low Risk Adult (2 of 2 - PCV13) 06/08/2018 (Originally 10/16/2015)  . INFLUENZA VACCINE  02/16/2018  .  TETANUS/TDAP  07/19/2021   Immunization History  Administered Date(s) Administered  . Influenza, High Dose Seasonal PF 05/14/2016, 06/08/2017  . Pneumococcal Polysaccharide-23 10/16/2014  . Td 07/20/2011   Last Colon -   Past Surgical History:  Procedure Laterality Date  . CHOLECYSTECTOMY  10/1990  . CORONARY ARTERY BYPASS GRAFT  1986   x5 LIMA to LAD,SVG to diagonal,SVG to OM,SVG to PDA and PLA  . CORONARY STENT PLACEMENT    . INGUINAL HERNIA REPAIR  04/25/2012   right- laparoscopic  . R/P Myoview  12/27/2008   no ischemia  . US ECHOCARDIOGRAPHY  12/27/2008   concentric LVH,mild to mod MR, TR and pulmonary hypertension. EF 45-50%   Family History  Problem Relation Age of Onset  . Cancer Father        bone  . Cancer Brother        lymphnode   Social  History   Socioeconomic History  . Marital status: Married    Spouse name: [name]  . Number of children: 3  . Years of education: 14  Occupational History  . Occupation: Retired   Tobacco Use  . Smoking status: Former Smoker    Last attempt to quit: 07/19/1970    Years since quitting: 47.5  . Smokeless tobacco: Never Used  Substance and Sexual Activity  . Alcohol use: No    Alcohol/week: 0.0 oz  . Drug use: No  . Sexual activity: Not on file  Social History Narrative   Drinks 1-2 cups of coffee a day, occasional coke     ROS Constitutional: Denies fever, chills, weight loss/gain, headaches, insomnia,  night sweats or change in appetite. Does c/o fatigue. Eyes: Denies redness, blurred vision, diplopia, discharge, itchy or watery eyes.  ENT: Denies discharge, congestion, post nasal drip, epistaxis, sore throat, earache, hearing loss, dental pain, Tinnitus, Vertigo, Sinus pain or snoring.  Cardio: Denies chest pain, palpitations, irregular heartbeat, syncope, dyspnea, diaphoresis, orthopnea, PND, claudication or edema Respiratory: denies cough, dyspnea, DOE, pleurisy, hoarseness, laryngitis or wheezing.  Gastrointestinal: Denies dysphagia, heartburn, reflux, water brash, pain, cramps, nausea, vomiting, bloating, diarrhea, constipation, hematemesis, melena, hematochezia, jaundice or hemorrhoids Genitourinary: Denies dysuria, frequency, urgency, nocturia, hesitancy, discharge, hematuria or flank pain Musculoskeletal: Denies arthralgia, myalgia, stiffness, Jt. Swelling, pain, limp or strain/sprain. Denies Falls. Skin: Denies puritis, rash, hives, warts, acne, eczema or change in skin lesion Neuro: No weakness, tremor, incoordination, spasms, paresthesia or pain Psychiatric: Denies confusion, memory loss or sensory loss. Denies Depression. Endocrine: Denies change in weight, skin, hair change, nocturia, and paresthesia, diabetic polys, visual blurring or hyper / hypo glycemic episodes.   Heme/Lymph: No excessive bleeding, bruising or enlarged lymph nodes.  Physical Exam  BP 118/74   Pulse 63   Temp 97.6 F (36.4 C)   Resp 16   Ht 5\' 6"  (1.676 m)   Wt 160 lb 9.6 oz (72.8 kg)   SpO2 97%   BMI 25.92 kg/m   General Appearance: Well nourished and well groomed and in no apparent distress.  Eyes: PERRLA, EOMs, conjunctiva no swelling or erythema, normal fundi and vessels. Sinuses: No frontal/maxillary tenderness ENT/Mouth: EACs patent / TMs  nl. Nares clear without erythema, swelling, mucoid exudates. Oral hygiene is good. No erythema, swelling, or exudate. Tongue normal, non-obstructing. Tonsils not swollen or erythematous. Hearing normal.  Neck: Supple, thyroid not palpable. No bruits, nodes or JVD. Respiratory: Respiratory effort normal.  BS equal and clear bilateral without rales, rhonci, wheezing or stridor. Cardio: Heart sounds are normal with regular  rate and rhythm and no murmurs, rubs or gallops. Peripheral pulses are normal and equal bilaterally without edema. No aortic or femoral bruits. Chest: symmetric with normal excursions and percussion.  Abdomen: Soft, with Nl bowel sounds. Nontender, no guarding, rebound, hernias, masses, or organomegaly.  Lymphatics: Non tender without lymphadenopathy.  Genitourinary: No hernias.Testes nl. DRE - prostate nl for age - smooth & firm w/o nodules. Musculoskeletal: Full ROM all peripheral extremities, joint stability, 5/5 strength, and normal gait. Skin: Warm and dry without rashes, lesions, cyanosis, clubbing or  ecchymosis.  Neuro: Cranial nerves intact, reflexes equal bilaterally. Normal muscle tone, no cerebellar symptoms. Sensation intact.  Pysch: Alert and oriented X 3 with normal affect, insight and judgment appropriate.   Assessment and Plan  1. Essential hypertension  - EKG 12-Lead - [place], RETROPERITNL ABD,  LTD - Urinalysis, Routine w reflex microscopic - Microalbumin / creatinine urine ratio - CBC with  Differential/Platelet - COMPLETE METABOLIC PANEL WITH GFR - Magnesium - TSH  2. Hyperlipidemia, mixed  - EKG 12-Lead - [place], RETROPERITNL ABD,  LTD - Lipid panel - TSH  3. Abnormal glucose  - EKG 12-Lead - [place], RETROPERITNL ABD,  LTD - Hemoglobin A1c - Insulin, random  4. Vitamin D deficiency  - VITAMIN D 25 Hydroxl  5. Prediabetes  - EKG 12-Lead - [place], RETROPERITNL ABD,  LTD - Hemoglobin A1c - Insulin, random  6. Coronary artery disease involving native coronary artery of native heart with other form of angina pectoris ([place])  - EKG 12-Lead - Lipid panel  7. Screening for colorectal cancer  - POC Hemoccult Bld/Stl  8. Prostate cancer screening  - PSA  9. BPH with obstruction/lower urinary tract symptoms  - PSA  10. Screening for ischemic heart disease  - EKG 12-Lead  11. Aortic atherosclerosis (HCC)  - EKG 12-Lead - [place], RETROPERITNL ABD,  LTD  12. FH: hypertension  - EKG 12-Lead - [place], RETROPERITNL ABD,  LTD  13. Former smoker  - EKG 12-Lead - [place], RETROPERITNL ABD,  LTD  14. Screening for AAA (aortic abdominal aneurysm)  - [place], RETROPERITNL ABD,  LTD  15. Medication management  - Urinalysis, Routine w reflex microscopic - Microalbumin / creatinine urine ratio - CBC with Differential/Platelet - COMPLETE METABOLIC PANEL WITH GFR - Magnesium - Lipid panel - TSH - Hemoglobin A1c - Insulin, random - VITAMIN D 25 Hydroxyl       Patient was counseled in prudent diet, weight control to achieve/maintain BMI less than 25, BP monitoring, regular exercise and medications as discussed.  Discussed med effects and SE's. Routine screening labs and tests as requested with regular follow-up as recommended. Over 40 minutes of exam, counseling, chart review and high complex critical decision making was performed

## 2018-02-03 LAB — URINALYSIS, ROUTINE W REFLEX MICROSCOPIC
Bilirubin Urine: NEGATIVE
Glucose, UA: NEGATIVE
Hgb urine dipstick: NEGATIVE
Ketones, ur: NEGATIVE
Leukocytes, UA: NEGATIVE
Nitrite: NEGATIVE
Protein, ur: NEGATIVE
Specific Gravity, Urine: 1.025 (ref 1.001–1.03)
pH: <=5 (ref 5.0–8.0)

## 2018-02-03 LAB — MAGNESIUM: Magnesium: 2 mg/dL (ref 1.5–2.5)

## 2018-02-03 LAB — CBC WITH DIFFERENTIAL/PLATELET
Basophils Absolute: 39 cells/uL (ref 0–200)
Basophils Relative: 0.5 %
Eosinophils Absolute: 139 cells/uL (ref 15–500)
Eosinophils Relative: 1.8 %
HCT: 43.2 % (ref 38.5–50.0)
Hemoglobin: 14.5 g/dL (ref 13.2–17.1)
Lymphs Abs: 2556 cells/uL (ref 850–3900)
MCH: 30.9 pg (ref 27.0–33.0)
MCHC: 33.6 g/dL (ref 32.0–36.0)
MCV: 91.9 fL (ref 80.0–100.0)
MPV: 10.7 fL (ref 7.5–12.5)
Monocytes Relative: 12 %
Neutro Abs: 4043 cells/uL (ref 1500–7800)
Neutrophils Relative %: 52.5 %
Platelets: 194 10*3/uL (ref 140–400)
RBC: 4.7 10*6/uL (ref 4.20–5.80)
RDW: 12.6 % (ref 11.0–15.0)
Total Lymphocyte: 33.2 %
WBC mixed population: 924 cells/uL (ref 200–950)
WBC: 7.7 10*3/uL (ref 3.8–10.8)

## 2018-02-03 LAB — COMPLETE METABOLIC PANEL WITH GFR
AG Ratio: 1.6 (calc) (ref 1.0–2.5)
ALT: 18 U/L (ref 9–46)
AST: 19 U/L (ref 10–35)
Albumin: 4.1 g/dL (ref 3.6–5.1)
Alkaline phosphatase (APISO): 64 U/L (ref 40–115)
BUN: 21 mg/dL (ref 7–25)
CO2: 32 mmol/L (ref 20–32)
Calcium: 9.5 mg/dL (ref 8.6–10.3)
Chloride: 104 mmol/L (ref 98–110)
Creat: 1.05 mg/dL (ref 0.70–1.11)
GFR, Est African American: 76 mL/min/{1.73_m2} (ref >=60)
GFR, Est Non African American: 65 mL/min/{1.73_m2} (ref >=60)
Globulin: 2.6 g/dL (calc) (ref 1.9–3.7)
Glucose, Bld: 102 mg/dL — ABNORMAL HIGH (ref 65–99)
Potassium: 4.1 mmol/L (ref 3.5–5.3)
Sodium: 140 mmol/L (ref 135–146)
Total Bilirubin: 0.3 mg/dL (ref 0.2–1.2)
Total Protein: 6.7 g/dL (ref 6.1–8.1)

## 2018-02-03 LAB — VITAMIN D 25 HYDROXY (VIT D DEFICIENCY, FRACTURES): Vit D, 25-Hydroxy: 60 ng/mL (ref 30–100)

## 2018-02-03 LAB — LIPID PANEL
Cholesterol: 170 mg/dL (ref <200)
HDL: 48 mg/dL (ref >40)
LDL Cholesterol (Calc): 98 mg/dL (calc)
Non-HDL Cholesterol (Calc): 122 mg/dL (calc) (ref <130)
Total CHOL/HDL Ratio: 3.5 (calc) (ref <5.0)
Triglycerides: 142 mg/dL (ref <150)

## 2018-02-03 LAB — HEMOGLOBIN A1C
Hgb A1c MFr Bld: 5.6 % of total Hgb (ref <5.7)
Mean Plasma Glucose: 114 (calc)
eAG (mmol/L): 6.3 (calc)

## 2018-02-03 LAB — MICROALBUMIN / CREATININE URINE RATIO
Creatinine, Urine: 106 mg/dL (ref 20–320)
Microalb Creat Ratio: 5 mcg/mg creat (ref <30)
Microalb, Ur: 0.5 mg/dL

## 2018-02-03 LAB — TSH: TSH: 0.47 mIU/L (ref 0.40–4.50)

## 2018-02-03 LAB — PSA: PSA: 36.6 ng/mL — ABNORMAL HIGH (ref <=4.0)

## 2018-02-03 LAB — INSULIN, RANDOM: Insulin: 17.4 u[IU]/mL (ref 2.0–19.6)

## 2018-02-14 DIAGNOSIS — Z1211 Encounter for screening for malignant neoplasm of colon: Secondary | ICD-10-CM | POA: Diagnosis not present

## 2018-02-14 DIAGNOSIS — Z1212 Encounter for screening for malignant neoplasm of rectum: Secondary | ICD-10-CM | POA: Diagnosis not present

## 2018-02-24 LAB — COLOGUARD: Cologuard: NEGATIVE

## 2018-02-27 ENCOUNTER — Encounter: Payer: Self-pay | Admitting: *Deleted

## 2018-03-06 ENCOUNTER — Inpatient Hospital Stay (HOSPITAL_COMMUNITY)
Admission: EM | Admit: 2018-03-06 | Discharge: 2018-03-10 | DRG: 244 | Disposition: A | Discharge status: Home Health | Payer: Medicare Other | Attending: Cardiovascular Disease | Admitting: Cardiovascular Disease

## 2018-03-06 ENCOUNTER — Emergency Department (HOSPITAL_COMMUNITY): Payer: Medicare Other

## 2018-03-06 ENCOUNTER — Other Ambulatory Visit: Payer: Self-pay

## 2018-03-06 DIAGNOSIS — I447 Left bundle-branch block, unspecified: Secondary | ICD-10-CM | POA: Diagnosis not present

## 2018-03-06 DIAGNOSIS — I2510 Atherosclerotic heart disease of native coronary artery without angina pectoris: Secondary | ICD-10-CM | POA: Diagnosis not present

## 2018-03-06 DIAGNOSIS — I472 Ventricular tachycardia: Secondary | ICD-10-CM | POA: Diagnosis present

## 2018-03-06 DIAGNOSIS — Z951 Presence of aortocoronary bypass graft: Secondary | ICD-10-CM | POA: Diagnosis not present

## 2018-03-06 DIAGNOSIS — Z7982 Long term (current) use of aspirin: Secondary | ICD-10-CM

## 2018-03-06 DIAGNOSIS — Z87891 Personal history of nicotine dependence: Secondary | ICD-10-CM | POA: Diagnosis not present

## 2018-03-06 DIAGNOSIS — I252 Old myocardial infarction: Secondary | ICD-10-CM

## 2018-03-06 DIAGNOSIS — I483 Typical atrial flutter: Secondary | ICD-10-CM | POA: Diagnosis not present

## 2018-03-06 DIAGNOSIS — R000 Tachycardia, unspecified: Secondary | ICD-10-CM | POA: Diagnosis not present

## 2018-03-06 DIAGNOSIS — R0789 Other chest pain: Secondary | ICD-10-CM | POA: Diagnosis not present

## 2018-03-06 DIAGNOSIS — Z950 Presence of cardiac pacemaker: Secondary | ICD-10-CM

## 2018-03-06 DIAGNOSIS — E785 Hyperlipidemia, unspecified: Secondary | ICD-10-CM | POA: Diagnosis present

## 2018-03-06 DIAGNOSIS — Z955 Presence of coronary angioplasty implant and graft: Secondary | ICD-10-CM

## 2018-03-06 DIAGNOSIS — G250 Essential tremor: Secondary | ICD-10-CM | POA: Diagnosis present

## 2018-03-06 DIAGNOSIS — Z79899 Other long term (current) drug therapy: Secondary | ICD-10-CM

## 2018-03-06 DIAGNOSIS — I491 Atrial premature depolarization: Secondary | ICD-10-CM | POA: Diagnosis not present

## 2018-03-06 DIAGNOSIS — E039 Hypothyroidism, unspecified: Secondary | ICD-10-CM | POA: Diagnosis present

## 2018-03-06 DIAGNOSIS — I4891 Unspecified atrial fibrillation: Secondary | ICD-10-CM | POA: Diagnosis not present

## 2018-03-06 DIAGNOSIS — Z9181 History of falling: Secondary | ICD-10-CM | POA: Diagnosis not present

## 2018-03-06 DIAGNOSIS — I442 Atrioventricular block, complete: Secondary | ICD-10-CM

## 2018-03-06 DIAGNOSIS — R079 Chest pain, unspecified: Secondary | ICD-10-CM

## 2018-03-06 DIAGNOSIS — I4892 Unspecified atrial flutter: Secondary | ICD-10-CM | POA: Diagnosis not present

## 2018-03-06 DIAGNOSIS — I255 Ischemic cardiomyopathy: Secondary | ICD-10-CM | POA: Diagnosis present

## 2018-03-06 DIAGNOSIS — I10 Essential (primary) hypertension: Secondary | ICD-10-CM | POA: Diagnosis present

## 2018-03-06 LAB — CBC
HCT: 46.8 % (ref 39.0–52.0)
Hemoglobin: 15.5 g/dL (ref 13.0–17.0)
MCH: 31.1 pg (ref 26.0–34.0)
MCHC: 33.1 g/dL (ref 30.0–36.0)
MCV: 93.8 fL (ref 78.0–100.0)
Platelets: 179 10*3/uL (ref 150–400)
RBC: 4.99 MIL/uL (ref 4.22–5.81)
RDW: 13.6 % (ref 11.5–15.5)
WBC: 9.6 10*3/uL (ref 4.0–10.5)

## 2018-03-06 LAB — BASIC METABOLIC PANEL
Anion gap: 7 (ref 5–15)
BUN: 16 mg/dL (ref 8–23)
CO2: 26 mmol/L (ref 22–32)
Calcium: 9 mg/dL (ref 8.9–10.3)
Chloride: 107 mmol/L (ref 98–111)
Creatinine, Ser: 0.91 mg/dL (ref 0.61–1.24)
GFR calc Af Amer: >60 mL/min (ref >60)
GFR calc non Af Amer: >60 mL/min (ref >60)
Glucose, Bld: 123 mg/dL — ABNORMAL HIGH (ref 70–99)
Potassium: 4.5 mmol/L (ref 3.5–5.1)
Sodium: 140 mmol/L (ref 135–145)

## 2018-03-06 LAB — TSH: TSH: 0.342 u[IU]/mL — ABNORMAL LOW (ref 0.350–4.500)

## 2018-03-06 LAB — MAGNESIUM: Magnesium: 1.9 mg/dL (ref 1.7–2.4)

## 2018-03-06 LAB — I-STAT TROPONIN, ED: Troponin i, poc: 0.07 ng/mL (ref 0.00–0.08)

## 2018-03-06 MED ORDER — ASPIRIN 81 MG PO CHEW: 324.0000 mg | CHEWABLE_TABLET | ORAL | Status: AC

## 2018-03-06 MED ORDER — ONDANSETRON HCL 4 MG/2ML IJ SOLN: 4.0000 mg | Freq: Four times a day (QID) | INTRAMUSCULAR | Status: DC | PRN

## 2018-03-06 MED ORDER — ACETAMINOPHEN 325 MG PO TABS: 650.0000 mg | ORAL_TABLET | ORAL | Status: DC | PRN

## 2018-03-06 MED ORDER — ASPIRIN 81 MG PO TABS: 81.0000 mg | ORAL_TABLET | Freq: Every day | ORAL | Status: DC

## 2018-03-06 MED ORDER — METOPROLOL SUCCINATE ER 25 MG PO TB24
25.0000 mg | ORAL_TABLET | Freq: Every day | ORAL | Status: DC
  Administered 2018-03-07 – 2018-03-10 (×3): 25 mg via ORAL
  Filled 2018-03-06 (×3): qty 1

## 2018-03-06 MED ORDER — ASPIRIN EC 81 MG PO TBEC
81.0000 mg | DELAYED_RELEASE_TABLET | Freq: Every day | ORAL | Status: DC
  Administered 2018-03-07 – 2018-03-08 (×2): 81 mg via ORAL
  Filled 2018-03-06 (×2): qty 1

## 2018-03-06 MED ORDER — DILTIAZEM HCL-DEXTROSE 100-5 MG/100ML-% IV SOLN (PREMIX)
5.0000 mg/h | INTRAVENOUS | Status: DC
  Administered 2018-03-06 – 2018-03-09 (×4): 5 mg/h via INTRAVENOUS
  Filled 2018-03-06 (×4): qty 100

## 2018-03-06 MED ORDER — ASPIRIN 300 MG RE SUPP: 300.0000 mg | RECTAL | Status: AC

## 2018-03-06 MED ORDER — HEPARIN BOLUS VIA INFUSION
4000.0000 [IU] | Freq: Once | INTRAVENOUS | Status: AC
  Administered 2018-03-06: 4000 [IU] via INTRAVENOUS
  Filled 2018-03-06: qty 4000

## 2018-03-06 MED ORDER — HEPARIN (PORCINE) IN NACL 100-0.45 UNIT/ML-% IJ SOLN
850.0000 [IU]/h | INTRAMUSCULAR | Status: DC
  Administered 2018-03-06: 850 [IU]/h via INTRAVENOUS
  Filled 2018-03-06: qty 250

## 2018-03-06 MED ORDER — NITROGLYCERIN 0.4 MG SL SUBL: 0.4000 mg | SUBLINGUAL_TABLET | SUBLINGUAL | Status: DC | PRN

## 2018-03-06 NOTE — Progress Notes (Signed)
CSW and RNCM met with pt at pt's bedside. Pt's wife is another pt in the ED. Pt is the primary caregiver for wife. Pt stated that he was told that his wife could stay in the room with him on a recliner. CSW and RNCM reviewed the potential issues with pt's wife staying in pt's room. Pt insistent that pt's wife stay in his room.   CSW and RNCM spoke with pt's wife. Pt's wife does not want to stay in pt's hospital room on recliner. Pt is waiting for room assignment.   Pt's wife will not be accompanying pt to the room.   Wendelyn Breslow, [facility]  [phone]

## 2018-03-06 NOTE — ED Provider Notes (Addendum)
[facility]PART[facility] Provider Note   CSN: [id number] Arrival date & time: 03/06/18  1426     History   Chief Complaint Chief Complaint  Patient presents with  . Irregular Heart Beat    HPI Paul Trickey. is a 84 y.o. male.  HPI Patient presents with chest pain.  States that for the last 3 days when he is woken up he had some chest tightness and feeling his heart race.  He got somewhat better throughout the day but was more fatigued.  Today it was longer lasting.  Chest tightness.  Unrelieved by nitroglycerin.  Patient states his heart rate would go up and down.  History of CABG and coronary artery disease.  No history of atrial fibrillation but EKG shows no onset atrial flutter.  No recent weight loss.  He has had previous falls and states he is very unsteady on his feet.  States he is having a lot of stress in his life because his wife has had Bell's palsy and a stroke and been having increased difficulty speaking. Past Medical History:  Diagnosis Date  . Arthritis   . CAD (coronary artery disease)   . Hyperlipidemia   . Hypertension   . Old myocardial infarct   . S/P CABG x 5 08/29/1984   LIMA to LAD,SVG to diagonal,SVG to OM,SVG to PDA and PLA  . Tremor, essential     Patient Active Problem List   Diagnosis Date Noted  . Atherosclerosis of aorta ([place]) 08/23/2016  . Basal cell carcinoma, ear 08/01/2015  . BMI 26.74,  adult 05/01/2015  . Major depression in full remission ([place]) 09/11/2014  . Essential hypertension 01/01/2014  . Vitamin D deficiency 01/01/2014  . Medication management 01/01/2014  . Other abnormal glucose 01/01/2014  . Nonsustained ventricular tachycardia ([place]) 05/23/2013  . CAD (coronary artery disease) 01/08/2013  . Cardiomyopathy, ischemic 01/08/2013  . Hyperlipidemia 01/08/2013  . LBBB (left bundle branch block) 01/08/2013    Past Surgical History:  Procedure Laterality Date  . CHOLECYSTECTOMY  10/1990  .  CORONARY ARTERY BYPASS GRAFT  1986   x5 LIMA to LAD,SVG to diagonal,SVG to OM,SVG to PDA and PLA  . CORONARY STENT PLACEMENT    . INGUINAL HERNIA REPAIR  04/25/2012   right- laparoscopic  . R/P Myoview  12/27/2008   no ischemia  . US ECHOCARDIOGRAPHY  12/27/2008   concentric LVH,mild to mod MR, TR and pulmonary hypertension. EF 45-50%        Home Medications    Prior to Admission medications   Medication Sig Start Date End Date Taking? Authorizing Provider  aspirin 81 MG tablet Take 81 mg by mouth daily.    [provider]  buPROPion (WELLBUTRIN XL) 300 MG 24 hr tablet Take 1 tablet daily for Mood a 12/01/17   Unk Pinto, MD  co-enzyme Q-10 30 MG capsule Take 60 mg by mouth daily.    [provider]  diazepam (VALIUM) 5 MG tablet Take 1/2 to 1 tablet 2 to 3 x / day only if needed for Muscle spasms  & please try to limit to 5 days /week to avoid addiction 12/01/17   Unk Pinto, MD  Digestive Enzymes CAPS Take 1-2 capsules by mouth as needed (digestive function).     [provider]  Magnesium Chloride (MAGNESIUM DR PO) Take 2 tablets by mouth daily.     [provider]  metoprolol succinate (TOPROL-XL) 25 MG 24 hr tablet TAKE 1 TABLET  BY MOUTH ONCE DAILY 12/01/17   Liane Comber, NP  nitroGLYCERIN (NITROSTAT) 0.4 MG SL tablet Place 1 tablet (0.4 mg total) under the tongue every 5 (five) minutes as needed for chest pain. 08/16/17   Liane Comber, NP  Specialty Vitamins Products (PROSTATE) TABS Take 2 tablets by mouth daily.     [provider]    Family History Family History  Problem Relation Age of Onset  . Cancer Father        bone  . Cancer Brother        lymphnode    Social History Social History   Tobacco Use  . Smoking status: Former Smoker    Last attempt to quit: 07/19/1970    Years since quitting: 47.6  . Smokeless tobacco: Never Used  Substance Use Topics  . Alcohol use: No    Alcohol/week: 0.0 standard  drinks  . Drug use: No     Allergies   Erythromycin   Review of Systems Review of Systems  Constitutional: Positive for fatigue. Negative for appetite change.  HENT: Negative for congestion.   Cardiovascular: Positive for chest pain.  Gastrointestinal: Negative for abdominal pain, nausea and vomiting.  Genitourinary: Negative for flank pain.  Musculoskeletal: Negative for back pain.  Skin: Negative for rash.  Neurological: Negative for numbness.  Psychiatric/Behavioral: Negative for confusion.     Physical Exam Updated Vital Signs BP (!) 132/95   Pulse 73   Resp 20   Ht 5' 6.5" (1.689 m)   Wt 72.6 kg   SpO2 99%   BMI 25.44 kg/m   Physical Exam  Constitutional: He appears well-developed.  HENT:  Head: Normocephalic.  Eyes: EOM are normal.  Neck: Neck supple.  Cardiovascular: Normal rate.  Pulmonary/Chest: No stridor. He has no rales.  Abdominal: Soft.  Musculoskeletal: He exhibits no edema.  Neurological: He is alert.  Skin: Capillary refill takes less than 2 seconds.  Psychiatric:  Patient is somewhat tearful when talking about his wife.     ED Treatments / Results  Labs (all labs ordered are listed, but only abnormal results are displayed) Labs Reviewed  BASIC METABOLIC PANEL - Abnormal; Notable for the following components:      Result Value   Glucose, Bld 123 (*)    All other components within normal limits  CBC  TSH  MAGNESIUM  I-STAT TROPONIN, ED    EKG EKG Interpretation  Date/Time:  Monday March 06 2018 14:33:16 EDT Ventricular Rate:  108 PR Interval:    QRS Duration: 144 QT Interval:  393 QTC Calculation: 544 R Axis:   -2 Text Interpretation:  new Atrial flutter IVCD, consider atypical LBBB Confirmed by Blanchie Dessert [phone]) on 03/06/2018 2:39:17 PM Also confirmed by Davonna Belling [phone])  on 03/06/2018 3:13:31 PM   Radiology No results found.  Procedures Procedures (including critical care time)  Medications Ordered  in ED Medications - No data to display   Initial Impression / Assessment and Plan / ED Course  I have reviewed the triage vital signs and the nursing notes.  Pertinent labs & imaging results that were available during my care of the patient were reviewed by me and considered in my medical decision making (see chart for details).     Patient presents with chest pain and new onset atrial flutter.  Chest pain unrelieved by nitroglycerin.  Patient states that for the third morning in a row he is felt bad and felt his heart racing when he woke up.  I  think this likely puts him greater than 48 hours for the onset of atrial flutter and not a candidate for ED cardioversion.  Chadsvasc score of at least 4 for age hypertension and vascular disease.  However he has had subdurals for falls and states he is very unsteady on his feet.  Risk and benefits of anticoagulation will be have to be discussed with the patient.  Will have cardiology see patient for likely admission for the chest pain and new onset atrial flutter.  CRITICAL CARE Performed by: Davonna Belling Total critical care time: 30 minutes Critical care time was exclusive of separately billable procedures and treating other patients. Critical care was necessary to treat or prevent imminent or life-threatening deterioration. Critical care was time spent personally by me on the following activities: development of treatment plan with patient and/or surrogate as well as nursing, discussions with consultants, evaluation of patient's response to treatment, examination of patient, obtaining history from patient or surrogate, ordering and performing treatments and interventions, ordering and review of laboratory studies, ordering and review of radiographic studies, pulse oximetry and re-evaluation of patient's condition.   Final Clinical Impressions(s) / ED Diagnoses   Final diagnoses:  Atrial flutter, unspecified type [facility])  Chest pain, unspecified  type    ED Discharge Orders    None       Davonna Belling, MD 03/06/18 [place], MD 03/17/18 [phone]

## 2018-03-06 NOTE — ED Triage Notes (Signed)
Pt in from home via [facility], per report pt has mid non radiating CP onset today, pt denies SOB, n/v/d, pt has Left Bundle Branch block without a known hx at this time, pt took 324 mg ASA at home & x 1 sL nitro, pt rcvd x 1 sL nitro by EMS, with relief from 6./10 to  2/10, pt in a fib, HR 46-134, A&O x4

## 2018-03-06 NOTE — Progress Notes (Signed)
ANTICOAGULATION CONSULT NOTE - Initial Consult  Pharmacy Consult for heparin  Indication: chest pain/ACS  Allergies  Allergen Reactions  . Erythromycin Nausea Only    Patient Measurements: Height: 5' 6.5" (168.9 cm) Weight: 160 lb (72.6 kg) IBW/kg (Calculated) : 64.95 Heparin Dosing Weight: 72  Vital Signs: BP: 118/75 (08/19 1745) Pulse Rate: 43 (08/19 1745)  Labs: Recent Labs    03/06/18 1439  HGB 15.5  HCT 46.8  PLT 179  CREATININE 0.91    Estimated Creatinine Clearance: 56.5 mL/min (by C-G formula based on SCr of 0.91 mg/dL).   Medical History: Past Medical History:  Diagnosis Date  . Arthritis   . CAD (coronary artery disease)   . Hyperlipidemia   . Hypertension   . Old myocardial infarct   . S/P CABG x 5 08/29/1984   LIMA to LAD,SVG to diagonal,SVG to OM,SVG to PDA and PLA  . Tremor, essential     Medications:    Assessment: 84yo M with PMH of CAD presenting with palpitations and chest pain started on heparin therapy for ACS. New aflutter was revealed on EKG, and diltizem gtt started as well. CBC WNL, no bleeding documented.   Goal of Therapy:  Heparin level 0.3-0.7 units/ml Monitor platelets by anticoagulation protocol: Yes   Plan:  Heparin 4000 units x1 Heparin 850 units/hr 8hr heparin level  Daily CBC, heparin level, monitor s/s bleeding  Harrietta Guardian, PharmD PGY1 Pharmacy Resident 03/06/2018    7:39 PM

## 2018-03-06 NOTE — H&P (Addendum)
History & Physical    Patient ID: Paul Frazier. MRN: [id number], DOB/AGE: [birth year]   Admit date: 03/06/2018  Primary Physician: Unk Pinto, MD Primary Cardiologist: Dr. Sallyanne Kuster  Patient Profile   Paul Frazier is a 84 y.o. male long-standing history of coronary artery disease (CABG LIMA to LAD,SVG to diagonal, SVG to OM, SVG to PDA and PLA 1986, PCI to SVG to the OM 2000, acute MI with inferolateral hypokinesis and loss of a saphenous vein graft (probably the SVG to the oblique marginal artery) 2012, nuclear stress test 2014 with LVEF down to 42% with inferior lateral and anterior lateral scar and hypokinesis but without any reversible ischemic abnormalities and moderate ischemic cardiomyopathy without overt congestive heart failure), hypertension and hyperlipidemia who is being admitted for chest pain and new onset atrial flutter.  Past Medical History   Past Medical History:  Diagnosis Date  . Arthritis   . CAD (coronary artery disease)   . Hyperlipidemia   . Hypertension   . Old myocardial infarct   . S/P CABG x 5 08/29/1984   LIMA to LAD,SVG to diagonal,SVG to OM,SVG to PDA and PLA  . Tremor, essential     Past Surgical History:  Procedure Laterality Date  . CHOLECYSTECTOMY  10/1990  . CORONARY ARTERY BYPASS GRAFT  1986   x5 LIMA to LAD,SVG to diagonal,SVG to OM,SVG to PDA and PLA  . CORONARY STENT PLACEMENT    . INGUINAL HERNIA REPAIR  04/25/2012   right- laparoscopic  . R/P Myoview  12/27/2008   no ischemia  . US ECHOCARDIOGRAPHY  12/27/2008   concentric LVH,mild to mod MR, TR and pulmonary hypertension. EF 45-50%    Allergies  Allergies  Allergen Reactions  . Erythromycin Nausea Only   History of Present Illness    Mr. Lowrimore is an 84 year old male with a history as stated above who presented to [facility] on [facility]/06/2018 after a 3-day history of new onset palpitations and mild chest tightness. Patient reports that on Saturday morning he woke to his heart racing  with elevated BP. He took one SL NTG and went back to bed. He intermittently took his BP and HR which were initially elevated each morning and would settle throughout the day. He continued to do his usual activities including caring for his ill wife without complication. He states that this same sequence happened on Sunday morning and today as well. He does state that he has been more fatigued than usual with these symptoms. He reports that he has recently been under a tremendous amount of stress given that his wife is quite ill. He is the sole caretaker for her. He has a long hx of CAD dating back to 89 and has been in good health overall over the years.   In the ED, EKG revealed atrial flutter. I-STAT troponin mildly elevated 0.07. TSH low at 0.342 rate CXR with no acute cardiopulmonary disease.  Magnesium within normal level, 1.9.  He was last seen by Dr. Sallyanne Kuster for follow up 12/10/15. It was noted that he had episodes of angina precipitated by some form of increased cardiac demand. Symptoms were controlled on low dose BB with the plan for avoidance of re-vascularization procedures due to patient preference at that time. He wore a monitor in 2016 which detected PAC's/PVC's and 1 degree AV block but no other arrhythmias. He was noted to be intolerant to statins in which his LDL was controlled without treatment.   Home Medications    Prior  to Admission medications   Medication Sig Start Date End Date Taking? Authorizing Provider  aspirin 81 MG tablet Take 81 mg by mouth daily.   Yes [provider]  buPROPion (WELLBUTRIN XL) 300 MG 24 hr tablet Take 1 tablet daily for Mood a Patient taking differently: Take 300 mg by mouth daily.  12/01/17  Yes Unk Pinto, MD  co-enzyme Q-10 30 MG capsule Take 60 mg by mouth daily.   Yes [provider]  diazepam (VALIUM) 5 MG tablet Take 1/2 to 1 tablet 2 to 3 x / day only if needed for Muscle spasms  & please try to limit to 5 days /week  to avoid addiction 12/01/17  Yes Unk Pinto, MD  Digestive Enzymes CAPS Take 1-2 capsules by mouth as needed (to aid digestive function).    Yes [provider]  Magnesium Chloride (MAGNESIUM DR PO) Take 2 tablets by mouth daily.    Yes [provider]  metoprolol succinate (TOPROL-XL) 25 MG 24 hr tablet TAKE 1 TABLET BY MOUTH ONCE DAILY 12/01/17  Yes Liane Comber, NP  nitroGLYCERIN (NITROSTAT) 0.4 MG SL tablet Place 1 tablet (0.4 mg total) under the tongue every 5 (five) minutes as needed for chest pain. 08/16/17  Yes Liane Comber, NP    Family History    Family History  Problem Relation Age of Onset  . Cancer Father        bone  . Cancer Brother        lymphnode    Social History    Social History   Socioeconomic History  . Marital status: Married    Spouse name: [name]  . Number of children: 3  . Years of education: 40  . Highest education level: Not on file  Occupational History  . Occupation: Retired     Comment: retired  Scientific laboratory technician  . Financial resource strain: Not on file  . Food insecurity:    Worry: Not on file    Inability: Not on file  . Transportation needs:    Medical: Not on file    Non-medical: Not on file  Tobacco Use  . Smoking status: Former Smoker    Last attempt to quit: 07/19/1970    Years since quitting: 47.6  . Smokeless tobacco: Never Used  Substance and Sexual Activity  . Alcohol use: No    Alcohol/week: 0.0 standard drinks  . Drug use: No  . Sexual activity: Not on file  Lifestyle  . Physical activity:    Days per week: Not on file    Minutes per session: Not on file  . Stress: Not on file  Relationships  . Social connections:    Talks on phone: Not on file    Gets together: Not on file    Attends religious service: Not on file    Active member of club or organization: Not on file    Attends meetings of clubs or organizations: Not on file    Relationship status: Not on file  . Intimate partner violence:      Fear of current or ex partner: Not on file    Emotionally abused: Not on file    Physically abused: Not on file    Forced sexual activity: Not on file  Other Topics Concern  . Not on file  Social History Narrative   Drinks 1-2 cups of coffee a day, occasional coke      Review of Systems   See history of present illness All  other systems reviewed and are otherwise negative except as noted above.  Physical Exam    Blood pressure 128/84, pulse 65, resp. rate 17, height 5' 6.5" (1.689 m), weight 72.6 kg, SpO2 100 %.   General: Well developed, well nourished, NAD Skin: Warm, dry, intact  Head: Normocephalic, atraumatic, clear, moist mucus membranes. Neck: Negative for carotid bruits. No JVD Lungs:Clear to ausculation bilaterally. No wheezes, rales, or rhonchi. Breathing is unlabored. Cardiovascular: Irregularly irregular with S1 S2. No murmurs, rubs, gallops, or LV heave appreciated. Abdomen: Soft, non-tender, non-distended with normoactive bowel sounds. No obvious abdominal masses. MSK: Strength and tone appear normal for age. 5/5 in all extremities Extremities: No edema. No clubbing or cyanosis. DP/PT pulses 2+ bilaterally Neuro: Alert and oriented. No focal deficits. No facial asymmetry. MAE spontaneously. Psych: Responds to questions appropriately with normal affect.    Labs    Troponin (Point of Care Test) Recent Labs    03/06/18 1444  TROPIPOC 0.07   No results for input(s): CKTOTAL, CKMB, TROPONINI in the last 72 hours. Lab Results  Component Value Date   WBC 9.6 03/06/2018   HGB 15.5 03/06/2018   HCT 46.8 03/06/2018   MCV 93.8 03/06/2018   PLT 179 03/06/2018    Recent Labs  Lab 03/06/18 1439  NA 140  K 4.5  CL 107  CO2 26  BUN 16  CREATININE 0.91  CALCIUM 9.0  GLUCOSE 123*   Lab Results  Component Value Date   CHOL 170 02/02/2018   HDL 48 02/02/2018   LDLCALC 98 02/02/2018   TRIG 142 02/02/2018   No results found for: [facility]   Radiology  Studies    Dg Chest 2 View  Result Date: 03/06/2018 CLINICAL DATA:  Tachycardia EXAM: CHEST - 2 VIEW COMPARISON:  12/04/2005 FINDINGS: The heart size and mediastinal contours are within normal limits. Post CABG change noted with median sternotomy sutures in place. Both lungs are clear. The visualized skeletal structures are unremarkable. IMPRESSION: No active cardiopulmonary disease. Electronically Signed   By: Ashley Royalty M.D.   On: 03/06/2018 15:30   ECG & Cardiac Imaging    EKG: 03/06/2018: Atrial flutter HR 108  Cardiac cath 04/29/2011: Scanned document see CV procedure tab  Assessment & Plan    1.  Chest pain in the setting of new onset atrial flutter: -Has a long history of CAD with new onset palpitations which began 3 days ago -EKG revealed atrial flutter with rates in the 120's -Will add IV diltiazem for rate control  -Obtain echocardiogram  -Will start IV hep gtt  -Patient reports he has had subdural hematomas in the past and is unsteady on his feet>>may avoid long term anticoagulation if this is a safety concern  -CHA2DS2VASc =4 (age, hypertension, vascular disease)  2.  History of significant CAD S/P CABG 1986: -See plan above  -Continue ASA, Toprol-XL 25 mg daily -No statin, intolerant   3. Hypothyroidism: -TSH low at 0.342 -Will check T3/T4  -Will adjust plan as needed   Severity of Illness: The appropriate patient status for this patient is OBSERVATION. Observation status is judged to be reasonable and necessary in order to provide the required intensity of service to ensure the patient's safety. The patient's presenting symptoms, physical exam findings, and initial radiographic and laboratory data in the context of their medical condition is felt to place them at decreased risk for further clinical deterioration. Furthermore, it is anticipated that the patient will be medically stable for discharge from the hospital  within 2 midnights of admission. The following  factors support the patient status of observation.   " The patient's presenting symptoms include atrial flutter. " The physical exam findings include elevated HR " The initial radiographic and laboratory data are abnormal EKG    Signed, Anchor Dwan NP-C [place] Pager: [phone] 03/06/2018, @NOW   Attending Note:   The patient was seen and examined.  Agree with assessment and plan as noted above.  Changes made to the above note as needed.  Patient seen and independently examined with Kathyrn Drown, NP on Aug. 19, 2019 .   We discussed all aspects of the encounter. I agree with the assessment and plan as stated above.  1.  Atrial flutter: Patient is found to have new onset atrial flutter.  He has had symptoms for the past 3 days. His point I would favor rate control and anticoagulation strategy.  We can have him return to see EP for consideration for ablation as an outpatient. We will get an echocardiogram for further assessment of his LV function and will use either diltiazem or metoprolol for rate control depending on his ejection fraction.  He has a history of a fall in the past with a subdural hematoma.  This was many years ago and the subdural hematoma is stable.  He does not fall off and it sounds like that particular fall was just an accident.    2.  Chest discomfort: Troponin levels are minimally elevated. Trend troponin levels.  He may need stress testing prior to discharge.   I have spent a total of 40 minutes with patient reviewing hospital  notes , telemetry, EKGs, labs and examining patient as well as establishing an assessment and plan that was discussed with the patient. > 50% of time was spent in direct patient care.    [name], Brooke Bonito., MD, [facility] 03/07/2018, 9:23 AM [address]  [place] Pager [phone]

## 2018-03-06 NOTE — ED Notes (Signed)
Pt remains in x-ray at this time.

## 2018-03-06 NOTE — ED Notes (Signed)
Dinner tray ordered for pt

## 2018-03-06 NOTE — ED Notes (Signed)
To xray at this time.

## 2018-03-06 NOTE — ED Notes (Signed)
Pt's cardiac rhythm remains in A-Fib.  No complaints voiced by pt.

## 2018-03-06 NOTE — ED Notes (Signed)
Attempted report 

## 2018-03-06 NOTE — ED Notes (Signed)
Pt ambulatory to bathroom without any problems 

## 2018-03-07 ENCOUNTER — Other Ambulatory Visit (HOSPITAL_COMMUNITY): Payer: Medicare Other

## 2018-03-07 ENCOUNTER — Encounter (HOSPITAL_COMMUNITY): Payer: Self-pay

## 2018-03-07 ENCOUNTER — Other Ambulatory Visit: Payer: Self-pay

## 2018-03-07 DIAGNOSIS — I10 Essential (primary) hypertension: Secondary | ICD-10-CM | POA: Diagnosis present

## 2018-03-07 DIAGNOSIS — Z79899 Other long term (current) drug therapy: Secondary | ICD-10-CM | POA: Diagnosis not present

## 2018-03-07 DIAGNOSIS — I340 Nonrheumatic mitral (valve) insufficiency: Secondary | ICD-10-CM | POA: Diagnosis not present

## 2018-03-07 DIAGNOSIS — I2510 Atherosclerotic heart disease of native coronary artery without angina pectoris: Secondary | ICD-10-CM | POA: Diagnosis present

## 2018-03-07 DIAGNOSIS — E039 Hypothyroidism, unspecified: Secondary | ICD-10-CM | POA: Diagnosis present

## 2018-03-07 DIAGNOSIS — I483 Typical atrial flutter: Secondary | ICD-10-CM | POA: Diagnosis present

## 2018-03-07 DIAGNOSIS — I472 Ventricular tachycardia: Secondary | ICD-10-CM | POA: Diagnosis present

## 2018-03-07 DIAGNOSIS — Z7982 Long term (current) use of aspirin: Secondary | ICD-10-CM | POA: Diagnosis not present

## 2018-03-07 DIAGNOSIS — E785 Hyperlipidemia, unspecified: Secondary | ICD-10-CM | POA: Diagnosis present

## 2018-03-07 DIAGNOSIS — Z87891 Personal history of nicotine dependence: Secondary | ICD-10-CM | POA: Diagnosis not present

## 2018-03-07 DIAGNOSIS — Z9181 History of falling: Secondary | ICD-10-CM | POA: Diagnosis not present

## 2018-03-07 DIAGNOSIS — Z951 Presence of aortocoronary bypass graft: Secondary | ICD-10-CM | POA: Diagnosis not present

## 2018-03-07 DIAGNOSIS — Z955 Presence of coronary angioplasty implant and graft: Secondary | ICD-10-CM | POA: Diagnosis not present

## 2018-03-07 DIAGNOSIS — I442 Atrioventricular block, complete: Secondary | ICD-10-CM | POA: Diagnosis not present

## 2018-03-07 DIAGNOSIS — G250 Essential tremor: Secondary | ICD-10-CM | POA: Diagnosis present

## 2018-03-07 DIAGNOSIS — I252 Old myocardial infarction: Secondary | ICD-10-CM | POA: Diagnosis not present

## 2018-03-07 DIAGNOSIS — I4892 Unspecified atrial flutter: Secondary | ICD-10-CM | POA: Diagnosis not present

## 2018-03-07 DIAGNOSIS — I255 Ischemic cardiomyopathy: Secondary | ICD-10-CM | POA: Diagnosis present

## 2018-03-07 DIAGNOSIS — Z950 Presence of cardiac pacemaker: Secondary | ICD-10-CM | POA: Diagnosis not present

## 2018-03-07 LAB — CBC
HCT: 44.8 % (ref 39.0–52.0)
Hemoglobin: 14.8 g/dL (ref 13.0–17.0)
MCH: 31 pg (ref 26.0–34.0)
MCHC: 33 g/dL (ref 30.0–36.0)
MCV: 93.7 fL (ref 78.0–100.0)
Platelets: 180 10*3/uL (ref 150–400)
RBC: 4.78 MIL/uL (ref 4.22–5.81)
RDW: 13.5 % (ref 11.5–15.5)
WBC: 9.4 10*3/uL (ref 4.0–10.5)

## 2018-03-07 LAB — BASIC METABOLIC PANEL
Anion gap: 5 (ref 5–15)
BUN: 13 mg/dL (ref 8–23)
BUN: 14 mg/dL (ref 8–23)
CO2: 23 mmol/L (ref 22–32)
CO2: 26 mmol/L (ref 22–32)
Calcium: 8.8 mg/dL — ABNORMAL LOW (ref 8.9–10.3)
Calcium: 8.6 mg/dL — ABNORMAL LOW (ref 8.9–10.3)
Chloride: 107 mmol/L (ref 98–111)
Chloride: 109 mmol/L (ref 98–111)
Creatinine, Ser: 0.88 mg/dL (ref 0.61–1.24)
Creatinine, Ser: 0.87 mg/dL (ref 0.61–1.24)
GFR calc Af Amer: >60 mL/min (ref >60)
GFR calc non Af Amer: >60 mL/min (ref >60)
Glucose, Bld: 153 mg/dL — ABNORMAL HIGH (ref 70–99)
Glucose, Bld: 108 mg/dL — ABNORMAL HIGH (ref 70–99)
Potassium: 3.8 mmol/L (ref 3.5–5.1)
Potassium: 4.2 mmol/L (ref 3.5–5.1)
Sodium: 142 mmol/L (ref 135–145)
Sodium: 140 mmol/L (ref 135–145)

## 2018-03-07 LAB — HEPARIN LEVEL (UNFRACTIONATED)
Heparin Unfractionated: 0.31 IU/mL (ref 0.30–0.70)
Heparin Unfractionated: 0.36 IU/mL (ref 0.30–0.70)

## 2018-03-07 LAB — LIPID PANEL
Cholesterol: 147 mg/dL (ref 0–200)
HDL: 46 mg/dL (ref >40)
LDL Cholesterol: 89 mg/dL (ref 0–99)
Total CHOL/HDL Ratio: 3.2 RATIO
Triglycerides: 62 mg/dL (ref <150)
VLDL: 12 mg/dL (ref 0–40)

## 2018-03-07 LAB — TROPONIN I
Troponin I: 0.08 ng/mL (ref <0.03)
Troponin I: 0.1 ng/mL (ref <0.03)
Troponin I: 0.06 ng/mL (ref <0.03)

## 2018-03-07 LAB — MRSA PCR SCREENING: MRSA by PCR: NEGATIVE

## 2018-03-07 LAB — T4, FREE: Free T4: 1.18 ng/dL (ref 0.82–1.77)

## 2018-03-07 MED ORDER — APIXABAN 5 MG PO TABS
5.0000 mg | ORAL_TABLET | Freq: Two times a day (BID) | ORAL | Status: DC
  Administered 2018-03-07 – 2018-03-10 (×7): 5 mg via ORAL
  Filled 2018-03-07 (×7): qty 1

## 2018-03-07 MED ORDER — SODIUM CHLORIDE 0.9 % IV SOLN
INTRAVENOUS | Status: DC | PRN
  Administered 2018-03-07: 250 mL via INTRAVENOUS

## 2018-03-07 NOTE — Progress Notes (Addendum)
CSW spoke with pt, updated pt about pt's wife. Pt's wife accepted by [facility] can provide care until Thursday at 11 am. Care can be extended. Pt agreeable to have wife return home with those services.   Wendelyn Breslow, [facility]  [phone]

## 2018-03-07 NOTE — Progress Notes (Signed)
Received report from Daralene Milch, RN

## 2018-03-07 NOTE — Progress Notes (Signed)
Progress Note  Patient Name: Paul Frazier. Date of Encounter: 03/07/2018  Primary Cardiologist: Dr Sallyanne Kuster  Subjective   No chest tightness or dyspnea  Inpatient Medications    Scheduled Meds: . aspirin  324 mg Oral NOW   Or  . aspirin  300 mg Rectal NOW  . aspirin EC  81 mg Oral Daily  . metoprolol succinate  25 mg Oral Daily   Continuous Infusions: . diltiazem (CARDIZEM) infusion 5 mg/hr (03/07/18 0542)  . heparin 850 Units/hr (03/07/18 0542)   PRN Meds: acetaminophen, nitroGLYCERIN, ondansetron (ZOFRAN) IV   Vital Signs    Vitals:   03/06/18 2130 03/06/18 2217 03/07/18 0430 03/07/18 0810  BP: 138/66 137/73 129/67 127/65  Pulse: (!) 43 71 88 (!) 51  Resp: (!) 23 (!) 21    Temp:  97.7 F (36.5 C) 98.2 F (36.8 C) 97.6 F (36.4 C)  TempSrc:  Oral Oral Oral  SpO2: 98% 100% 97% 100%  Weight:  72.8 kg    Height:        Intake/Output Summary (Last 24 hours) at 03/07/2018 0911 Last data filed at 03/07/2018 0811 Gross per 24 hour  Intake 160.53 ml  Output 900 ml  Net -739.47 ml   Filed Weights   03/06/18 1431 03/06/18 2217  Weight: 72.6 kg 72.8 kg    Telemetry    Atrial flutter rate controlled- Personally Reviewed  Physical Exam   GEN: No acute distress.   Neck: No JVD Cardiac: irregular Respiratory: Clear to auscultation bilaterally. GI: Soft, nontender, non-distended  MS: No edema Neuro:  Nonfocal  Psych: Normal affect   Labs    Chemistry Recent Labs  Lab 03/06/18 1439 03/06/18 2350 03/07/18 0459  NA 140 142 140  K 4.5 3.8 4.2  CL 107 107 109  CO2 26 23 26   GLUCOSE 123* 153* 108*  BUN 16 13 14   CREATININE 0.91 0.88 0.87  CALCIUM 9.0 8.8* 8.6*  GFRNONAA >60  --  >60  GFRAA >60  --  >60  ANIONGAP 7  --  5     Hematology Recent Labs  Lab 03/06/18 1439 03/07/18 0459  WBC 9.6 9.4  RBC 4.99 4.78  HGB 15.5 14.8  HCT 46.8 44.8  MCV 93.8 93.7  MCH 31.1 31.0  MCHC 33.1 33.0  RDW 13.6 13.5  PLT 179 180    Cardiac  Enzymes Recent Labs  Lab 03/06/18 2350 03/07/18 0459  TROPONINI 0.08* 0.10*    Recent Labs  Lab 03/06/18 1444  TROPIPOC 0.07      Radiology    Dg Chest 2 View  Result Date: 03/06/2018 CLINICAL DATA:  Tachycardia EXAM: CHEST - 2 VIEW COMPARISON:  12/04/2005 FINDINGS: The heart size and mediastinal contours are within normal limits. Post CABG change noted with median sternotomy sutures in place. Both lungs are clear. The visualized skeletal structures are unremarkable. IMPRESSION: No active cardiopulmonary disease. Electronically Signed   By: Ashley Royalty M.D.   On: 03/06/2018 15:30    Patient Profile     84 y.o. male with past medical history of coronary artery disease status post coronary artery bypass and graft, hypertension, hyperlipidemia admitted with new onset atrial flutter and chest tightness.  Assessment & Plan    1 atrial flutter-patient with newly diagnosed typical atrial flutter.  Continue Cardizem and metoprolol for rate control.  Schedule echocardiogram to assess LV function.  TSH is minimally decreased.  CHADSvasc 4.  Continue heparin.  Patient's complaints are palpitations and  fatigue.  Also with chest tightness.  I will try and arrange a TEE guided cardioversion tomorrow.  He can follow-up with electrophysiology as an outpatient for consideration of ablation.  He is somewhat unsteady on his feet and I think ablation would be the best long-term solution for his atrial flutter to avoid anticoagulation.  2 chest tightness-patient states he has had intermittent chest tightness for years.  His symptoms are unchanged.  Troponin is borderline but no clear trend (0.07, 0.08 and 0.10).  We will arrange a stress nuclear study following discharge.  3 decreased TSH-patient will need follow-up with primary care.  Free T4 is normal.  For questions or updates, please contact [place] Please consult [email or web address] for contact info under Cardiology/STEMI.      Signed, Kirk Ruths, MD  03/07/2018, 9:11 AM

## 2018-03-07 NOTE — Clinical Social Work Note (Signed)
Clinical Social Work Assessment  Patient Details  Name: Paul Frazier. MRN: [id number] Date of Birth: 21-Jan-1933  Date of referral:  03/07/18               Reason for consult:  Family Concerns                Permission sought to share information with:    Permission granted to share information::     Name::        Agency::     Relationship::     Contact Information:     Housing/Transportation Living arrangements for the past 2 months:  Single Family Home Source of Information:  Patient Patient Interpreter Needed:  None Criminal Activity/Legal Involvement Pertinent to Current Situation/Hospitalization:  No - Comment as needed Significant Relationships:  Adult Children, Spouse Lives with:  Spouse Do you feel safe going back to the place where you live?  Yes Need for family participation in patient care:  No (Coment)  Care giving concerns: Patient is from home with wife. Patient is the only caregiver for wife, who is unsafe to be at home alone. Patient supports her with all ADLs.    Social Worker assessment / plan: CSW met with patient at bedside. Patient alert and oriented. CSW introduced self and role and discussed supports and resources for supporting his wife at home. ED social worker, Jeanette Caprice, also following, as patient's wife is in ED. Patient is admitted inpatient and plans to return home at discharge to care for his wife.  Patient reported they had support from [facility], and is wondering if this can continue when they return. They did not have 24 hour care in the home. Also had questions about placing his wife in a SNF or ALF. CSW discussed cost and eligibility for facilities. Patient's wife would not be eligible for insurance to cover SNF, as she does not have a three night qualifying stay in the hospital. CSW provided resources for locating ALF, though patient's wife likely needs a higher level of care. Referred patient to [facility].  CSW also advised patient reach  out to DSS to determine patient's eligibility for Medicaid to cover long term care and provided DSS contact information.  CSW to follow and support during admission.  Employment status:  Retired Forensic scientist:  Medicare PT Recommendations:  Not assessed at this time [place] / Referral to community resources:  Other (Comment Required)(Assisted living, DSS, [facility])  Patient/Family's Response to care: Patient appreciative of resources.  Patient/Family's Understanding of and Emotional Response to Diagnosis, Current Treatment, and Prognosis: Patient with understanding of his and his wife's conditions and considering options for care for his wife.  Emotional Assessment Appearance:  Appears stated age Attitude/Demeanor/Rapport:  Engaged Affect (typically observed):  Accepting, Calm, Appropriate Orientation:  Oriented to Self, Oriented to Place, Oriented to  Time, Oriented to Situation Alcohol / Substance use:  Not Applicable Psych involvement (Current and /or in the community):  No (Comment)  Discharge Needs  Concerns to be addressed:  Care Coordination, Lack of Support Readmission within the last 30 days:  No Current discharge risk:  Physical Impairment Barriers to Discharge:  Continued Medical Work up   Estanislado Emms, LCSW 03/07/2018, 4:36 PM

## 2018-03-07 NOTE — Progress Notes (Signed)
[facility] for Heparin  Indication: chest pain/ACS, aflutter  Allergies  Allergen Reactions  . Erythromycin Nausea Only    Patient Measurements: Height: 5' 6.5" (168.9 cm) Weight: 160 lb 8 oz (72.8 kg) IBW/kg (Calculated) : 64.95 Heparin Dosing Weight: 72  Vital Signs: Temp: 98.2 F (36.8 C) (08/20 0430) Temp Source: Oral (08/20 0430) BP: 129/67 (08/20 0430) Pulse Rate: 88 (08/20 0430)  Labs: Recent Labs    03/06/18 1439 03/06/18 2350 03/07/18 0459  HGB 15.5  --  14.8  HCT 46.8  --  44.8  PLT 179  --  180  HEPARINUNFRC  --   --  0.31  CREATININE 0.91 0.88  --   TROPONINI  --  0.08*  --     Estimated Creatinine Clearance: 58.5 mL/min (by C-G formula based on SCr of 0.88 mg/dL).   Medical History: Past Medical History:  Diagnosis Date  . Arthritis   . CAD (coronary artery disease)   . Hyperlipidemia   . Hypertension   . Old myocardial infarct   . S/P CABG x 5 08/29/1984   LIMA to LAD,SVG to diagonal,SVG to OM,SVG to PDA and PLA  . Tremor, essential     Assessment: 84yo M with PMH of CAD presenting with palpitations and chest pain started on heparin therapy for ACS. New aflutter was revealed on EKG, and diltizem gtt started as well. CBC WNL, no bleeding documented.   8/20 AM update: initial heparin level this AM is therapeutic, CBC stable  Goal of Therapy:  Heparin level 0.3-0.7 units/ml Monitor platelets by anticoagulation protocol: Yes   Plan:  Heparin 850 units/hr 8hr heparin level to confirm Daily CBC, heparin level, monitor s/s bleeding  Narda Bonds, PharmD, BCPS Clinical Pharmacist Phone: [phone]

## 2018-03-07 NOTE — Progress Notes (Signed)
ED CSW following pt's wife in the ED. CSW spoke with pt at bedside and was informed that he is pt's primary care taker at home and that no one else is at home to care for pt's wife while pt is in the hospital. Pt expressed that pt's wife has been receiving services via [facility] and that if they are able to provide 24 hour care for wife then he would be agreeable to wife going home. CSW expressed that at this time CSW has been in contact with [facility] to see if they are able to take care of pt's wife  for some time until pt gets discharged-Paul Frazier from [facility] looking into this.   While speaking with pt at bedside. Pt became very tearful and emotional about wife. Pt expressed that he only wants her safe and okay at this time. CSW reassured pt that wife is fine and is in the ED and will remain in the ED if Paul Frazier is unable to assist over night. Pt thanked CSW and CSW verbalized that she would be in contact with pt as much as possible to keep pt updated.   Paul Frazier, MSW, [facility] Clinical Social Worker [phone]

## 2018-03-07 NOTE — Care Management (Signed)
03-07-18 BENEFITS CHECK :  # 8.   S/W [facility]  @ [employer]  # [phone]  1. ELIQUIS  2.5 MG BID COVER- YES CO-PAY- $ 40.00 TIER- 3 DRUG PRIOR APPROVAL- NO  2. ELIQUIS 5 MG BID COVER- YES CO-PAY- $ 40.00 TIER- 3 DRUG PRIOR APPROVAL- NO  PREFERRED PHARMACY : YES [facility]

## 2018-03-07 NOTE — Progress Notes (Signed)
Pt arrived on unit- ambulated to bed- oriented to unit-

## 2018-03-08 ENCOUNTER — Inpatient Hospital Stay (HOSPITAL_COMMUNITY): Payer: Medicare Other

## 2018-03-08 DIAGNOSIS — I340 Nonrheumatic mitral (valve) insufficiency: Secondary | ICD-10-CM

## 2018-03-08 LAB — BASIC METABOLIC PANEL
Anion gap: 5 (ref 5–15)
BUN: 14 mg/dL (ref 8–23)
CO2: 28 mmol/L (ref 22–32)
Calcium: 8.8 mg/dL — ABNORMAL LOW (ref 8.9–10.3)
Chloride: 107 mmol/L (ref 98–111)
Creatinine, Ser: 0.98 mg/dL (ref 0.61–1.24)
GFR calc Af Amer: >60 mL/min (ref >60)
GFR calc non Af Amer: >60 mL/min (ref >60)
Glucose, Bld: 110 mg/dL — ABNORMAL HIGH (ref 70–99)
Potassium: 4.7 mmol/L (ref 3.5–5.1)
Sodium: 140 mmol/L (ref 135–145)

## 2018-03-08 LAB — CBC
HCT: 46.1 % (ref 39.0–52.0)
Hemoglobin: 15.2 g/dL (ref 13.0–17.0)
MCH: 30.9 pg (ref 26.0–34.0)
MCHC: 33 g/dL (ref 30.0–36.0)
MCV: 93.7 fL (ref 78.0–100.0)
Platelets: 200 10*3/uL (ref 150–400)
RBC: 4.92 MIL/uL (ref 4.22–5.81)
RDW: 13.7 % (ref 11.5–15.5)
WBC: 8.2 10*3/uL (ref 4.0–10.5)

## 2018-03-08 LAB — T3, FREE: T3, Free: 2.6 pg/mL (ref 2.0–4.4)

## 2018-03-08 LAB — ECHOCARDIOGRAM COMPLETE
Height: 66.5 in
Weight: 2452.8 oz

## 2018-03-08 MED ORDER — DIAZEPAM 5 MG PO TABS
5.0000 mg | ORAL_TABLET | Freq: Four times a day (QID) | ORAL | Status: DC | PRN
  Administered 2018-03-08: 5 mg via ORAL
  Filled 2018-03-08: qty 1

## 2018-03-08 MED ORDER — SODIUM CHLORIDE 0.9 % IV SOLN
INTRAVENOUS | Status: DC
  Administered 2018-03-09: 06:00:00 via INTRAVENOUS

## 2018-03-08 NOTE — Progress Notes (Signed)
Progress Note  Patient Name: Paul Frazier. Date of Encounter: 03/08/2018  Primary Cardiologist: Dr Sallyanne Kuster  Subjective   No CP or dyspnea  Inpatient Medications    Scheduled Meds: . apixaban  5 mg Oral BID  . aspirin EC  81 mg Oral Daily  . metoprolol succinate  25 mg Oral Daily   Continuous Infusions: . sodium chloride 5 mL/hr at 03/08/18 0651  . diltiazem (CARDIZEM) infusion 5 mg/hr (03/08/18 0836)   PRN Meds: sodium chloride, acetaminophen, nitroGLYCERIN, ondansetron (ZOFRAN) IV   Vital Signs    Vitals:   03/07/18 2017 03/07/18 2344 03/08/18 0419 03/08/18 0737  BP: 127/74 137/76 122/65 139/74  Pulse: 70 69 67 69  Resp: 18 18 18 18   Temp: 97.9 F (36.6 C) 98 F (36.7 C) 97.7 F (36.5 C) 97.6 F (36.4 C)  TempSrc: Oral Oral Oral Oral  SpO2: 98% 99% 96% 96%  Weight:   69.5 kg   Height:        Intake/Output Summary (Last 24 hours) at 03/08/2018 0847 Last data filed at 03/08/2018 0651 Gross per 24 hour  Intake 885.73 ml  Output 1400 ml  Net -514.27 ml   Filed Weights   03/06/18 1431 03/06/18 2217 03/08/18 0419  Weight: 72.6 kg 72.8 kg 69.5 kg    Telemetry    Atrial flutter rate controlled- Personally Reviewed  Physical Exam   GEN: No acute distress.  WD/WN Neck: No JVD; supple Cardiac: irregular, no murmur Respiratory: Clear to auscultation bilaterally; no wheeze. GI: Soft, nontender, non-distended, no masses MS: No edema Neuro:  grossly intact   Labs    Chemistry Recent Labs  Lab 03/06/18 1439 03/06/18 2350 03/07/18 0459 03/08/18 0348  NA 140 142 140 140  K 4.5 3.8 4.2 4.7  CL 107 107 109 107  CO2 26 23 26 28   GLUCOSE 123* 153* 108* 110*  BUN 16 13 14 14   CREATININE 0.91 0.88 0.87 0.98  CALCIUM 9.0 8.8* 8.6* 8.8*  GFRNONAA >60  --  >60 >60  GFRAA >60  --  >60 >60  ANIONGAP 7  --  5 5     Hematology Recent Labs  Lab 03/06/18 1439 03/07/18 0459 03/08/18 0348  WBC 9.6 9.4 8.2  RBC 4.99 4.78 4.92  HGB 15.5 14.8  15.2  HCT 46.8 44.8 46.1  MCV 93.8 93.7 93.7  MCH 31.1 31.0 30.9  MCHC 33.1 33.0 33.0  RDW 13.6 13.5 13.7  PLT 179 180 200    Cardiac Enzymes Recent Labs  Lab 03/06/18 2350 03/07/18 0459 03/07/18 1128  TROPONINI 0.08* 0.10* 0.06*    Recent Labs  Lab 03/06/18 1444  TROPIPOC 0.07      Radiology    Dg Chest 2 View  Result Date: 03/06/2018 CLINICAL DATA:  Tachycardia EXAM: CHEST - 2 VIEW COMPARISON:  12/04/2005 FINDINGS: The heart size and mediastinal contours are within normal limits. Post CABG change noted with median sternotomy sutures in place. Both lungs are clear. The visualized skeletal structures are unremarkable. IMPRESSION: No active cardiopulmonary disease. Electronically Signed   By: Ashley Royalty M.D.   On: 03/06/2018 15:30    Patient Profile     84 y.o. male with past medical history of coronary artery disease status post coronary artery bypass and graft, hypertension, hyperlipidemia admitted with new onset atrial flutter and chest tightness.  Assessment & Plan    1 atrial flutter-patient with newly diagnosed typical atrial flutter.  Continue Cardizem and metoprolol for rate  control.  Await echocardiogram.  TSH is minimally decreased but T3 and T4 normal.  CHADSvasc 4.  Continue apixaban.  Patient's complaints are palpitations and fatigue.  I have reviewed with electrophysiology.  Patient will likely have TEE tomorrow morning followed by ablation if no left atrial appendage thrombus noted.  He is somewhat unsteady on his feet and I think ablation would be the best long-term solution for his atrial flutter to avoid anticoagulation.  2 chest tightness-follow-up enzymes show no clear trend and not consistent with acute coronary syndrome.  We will arrange a stress nuclear study following discharge.    3 decreased TSH-patient will need follow-up with primary care.  Free T4 is normal.  For questions or updates, please contact [place] Please consult [email or web address]  for contact info under Cardiology/STEMI.      Signed, Kirk Ruths, MD  03/08/2018, 8:47 AM

## 2018-03-08 NOTE — Social Work (Signed)
Pt's son, Zyire Eidson, [phone], came to the ED to speak with CSW. Pt's son expressed concerns and frustration with pt's wife's care from [facility]. 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 CSW called and spoke with Georgina Snell with [facility]. [facility] confirmed that pt's wife has an aid with her now and will have services until pt is discharged. If pt's discharge is extended past tomorrow, CSW will leave handoff for floor CSW to extend services with [facility].   Wendelyn Breslow, [facility]  [phone]

## 2018-03-08 NOTE — Progress Notes (Signed)
  Echocardiogram 2D Echocardiogram has been performed.  Paul Frazier 03/08/2018, 11:19 AM

## 2018-03-08 NOTE — Progress Notes (Signed)
    [facility] has been requested to perform a transesophageal echocardiogram on Severiano Utsey. for atrial flutter.  After careful review of history and examination, the risks and benefits of transesophageal echocardiogram have been explained including risks of esophageal damage, perforation (1:10,000 risk), bleeding, pharyngeal hematoma as well as other potential complications associated with conscious sedation including aspiration, arrhythmia, respiratory failure and death. Alternatives to treatment were discussed, questions were answered. Patient is willing to proceed.   The TEE is scheduled for tomorrow, 8/22, at 11:00 with Dr. Harrington Challenger.  Mr. Sheppard is tentatively scheduled for aflutter ablation tomorrow after the TEE. Dr. Lovena Le will see the patient in the morning prior to TEE.   Daune Perch, NP  03/08/2018 10:39 AM

## 2018-03-09 ENCOUNTER — Telehealth: Payer: Self-pay

## 2018-03-09 ENCOUNTER — Other Ambulatory Visit: Payer: Self-pay | Admitting: Physician Assistant

## 2018-03-09 ENCOUNTER — Encounter (HOSPITAL_COMMUNITY): Payer: Self-pay | Admitting: Certified Registered Nurse Anesthetist

## 2018-03-09 ENCOUNTER — Encounter (HOSPITAL_COMMUNITY)
Admission: EM | Disposition: A | Discharge status: Home Health | Payer: Self-pay | Source: Home / Self Care | Attending: Cardiovascular Disease

## 2018-03-09 ENCOUNTER — Inpatient Hospital Stay (HOSPITAL_COMMUNITY): Payer: Medicare Other

## 2018-03-09 ENCOUNTER — Encounter (HOSPITAL_COMMUNITY): Payer: Self-pay

## 2018-03-09 DIAGNOSIS — I483 Typical atrial flutter: Secondary | ICD-10-CM

## 2018-03-09 DIAGNOSIS — R079 Chest pain, unspecified: Secondary | ICD-10-CM

## 2018-03-09 DIAGNOSIS — I340 Nonrheumatic mitral (valve) insufficiency: Secondary | ICD-10-CM

## 2018-03-09 HISTORY — PX: TEE WITHOUT CARDIOVERSION: SHX5443

## 2018-03-09 HISTORY — PX: PACEMAKER IMPLANT: EP1218

## 2018-03-09 HISTORY — PX: A-FLUTTER ABLATION: EP1230

## 2018-03-09 LAB — CBC
HCT: 45.7 % (ref 39.0–52.0)
Hemoglobin: 15.3 g/dL (ref 13.0–17.0)
MCH: 31.5 pg (ref 26.0–34.0)
MCHC: 33.5 g/dL (ref 30.0–36.0)
MCV: 94 fL (ref 78.0–100.0)
Platelets: 205 10*3/uL (ref 150–400)
RBC: 4.86 MIL/uL (ref 4.22–5.81)
RDW: 13.7 % (ref 11.5–15.5)
WBC: 9.8 10*3/uL (ref 4.0–10.5)

## 2018-03-09 SURGERY — ECHOCARDIOGRAM, TRANSESOPHAGEAL
Anesthesia: Moderate Sedation

## 2018-03-09 SURGERY — A-FLUTTER ABLATION

## 2018-03-09 MED ORDER — SODIUM CHLORIDE 0.9 % IV SOLN
INTRAVENOUS | Status: AC
  Filled 2018-03-09: qty 2

## 2018-03-09 MED ORDER — MIDAZOLAM HCL 5 MG/ML IJ SOLN
INTRAMUSCULAR | Status: AC
  Filled 2018-03-09: qty 2

## 2018-03-09 MED ORDER — BUPIVACAINE HCL (PF) 0.25 % IJ SOLN
INTRAMUSCULAR | Status: DC | PRN
  Administered 2018-03-09: 20 mL

## 2018-03-09 MED ORDER — LIDOCAINE VISCOUS HCL 2 % MT SOLN
OROMUCOSAL | Status: AC
  Filled 2018-03-09: qty 15

## 2018-03-09 MED ORDER — ONDANSETRON HCL 4 MG/2ML IJ SOLN: 4.0000 mg | Freq: Four times a day (QID) | INTRAMUSCULAR | Status: DC | PRN

## 2018-03-09 MED ORDER — CEFAZOLIN SODIUM-DEXTROSE 2-4 GM/100ML-% IV SOLN
INTRAVENOUS | Status: AC
  Filled 2018-03-09: qty 100

## 2018-03-09 MED ORDER — LIDOCAINE VISCOUS HCL 2 % MT SOLN
OROMUCOSAL | Status: DC | PRN
  Administered 2018-03-09: 20 mL via OROMUCOSAL

## 2018-03-09 MED ORDER — LIDOCAINE HCL (PF) 1 % IJ SOLN
INTRAMUSCULAR | Status: DC | PRN
  Administered 2018-03-09: 45 mL

## 2018-03-09 MED ORDER — CEFAZOLIN SODIUM-DEXTROSE 2-3 GM-%(50ML) IV SOLR
INTRAVENOUS | Status: AC | PRN
  Administered 2018-03-09: 2 g via INTRAVENOUS

## 2018-03-09 MED ORDER — HEPARIN (PORCINE) IN NACL 1000-0.9 UT/500ML-% IV SOLN
INTRAVENOUS | Status: DC | PRN
  Administered 2018-03-09 (×2): 500 mL

## 2018-03-09 MED ORDER — MIDAZOLAM HCL 5 MG/5ML IJ SOLN
INTRAMUSCULAR | Status: AC
  Filled 2018-03-09: qty 5

## 2018-03-09 MED ORDER — ACETAMINOPHEN 325 MG PO TABS
325.0000 mg | ORAL_TABLET | ORAL | Status: DC | PRN
  Administered 2018-03-10: 650 mg via ORAL
  Filled 2018-03-09: qty 2

## 2018-03-09 MED ORDER — BUPIVACAINE HCL (PF) 0.25 % IJ SOLN
INTRAMUSCULAR | Status: AC
  Filled 2018-03-09: qty 30

## 2018-03-09 MED ORDER — LIDOCAINE HCL (PF) 1 % IJ SOLN
INTRAMUSCULAR | Status: AC
  Filled 2018-03-09: qty 60

## 2018-03-09 MED ORDER — MIDAZOLAM HCL 5 MG/5ML IJ SOLN
INTRAMUSCULAR | Status: DC | PRN
  Administered 2018-03-09: 1 mg via INTRAVENOUS
  Administered 2018-03-09: 2 mg via INTRAVENOUS
  Administered 2018-03-09: 1 mg via INTRAVENOUS

## 2018-03-09 MED ORDER — MIDAZOLAM HCL 5 MG/ML IJ SOLN
INTRAMUSCULAR | Status: AC
  Filled 2018-03-09: qty 1

## 2018-03-09 MED ORDER — FENTANYL CITRATE (PF) 100 MCG/2ML IJ SOLN
INTRAMUSCULAR | Status: AC
  Filled 2018-03-09: qty 2

## 2018-03-09 MED ORDER — FENTANYL CITRATE (PF) 100 MCG/2ML IJ SOLN
INTRAMUSCULAR | Status: DC | PRN
  Administered 2018-03-09 (×2): 12.5 ug via INTRAVENOUS
  Administered 2018-03-09 (×3): 25 ug via INTRAVENOUS

## 2018-03-09 MED ORDER — CEFAZOLIN SODIUM-DEXTROSE 1-4 GM/50ML-% IV SOLN
1.0000 g | Freq: Four times a day (QID) | INTRAVENOUS | Status: AC
  Administered 2018-03-09 – 2018-03-10 (×3): 1 g via INTRAVENOUS
  Filled 2018-03-09 (×3): qty 50

## 2018-03-09 MED ORDER — MIDAZOLAM HCL 10 MG/2ML IJ SOLN
INTRAMUSCULAR | Status: DC | PRN
  Administered 2018-03-09 (×5): 2 mg via INTRAVENOUS

## 2018-03-09 MED ORDER — DIPHENHYDRAMINE HCL 50 MG/ML IJ SOLN
INTRAMUSCULAR | Status: AC
  Filled 2018-03-09: qty 1

## 2018-03-09 MED ORDER — SODIUM CHLORIDE 0.9 % IV SOLN
INTRAVENOUS | Status: DC | PRN
  Administered 2018-03-09: 14:00:00

## 2018-03-09 MED ORDER — FENTANYL CITRATE (PF) 100 MCG/2ML IJ SOLN
INTRAMUSCULAR | Status: DC | PRN
  Administered 2018-03-09: 25 ug via INTRAVENOUS
  Administered 2018-03-09 (×2): 12.5 ug via INTRAVENOUS

## 2018-03-09 SURGICAL SUPPLY — 19 items
CABLE SURGICAL S-101-97-12 (CABLE) ×1 IMPLANT
CATH BLAZERPRIME XP (ABLATOR) ×1 IMPLANT
CATH JOSEPHSON QUAD-ALLRED 6FR (CATHETERS) ×1 IMPLANT
CATH POLARIS X REPROCESSED (CATHETERS) ×1 IMPLANT
CATH RIGHTSITE C315HIS02 (CATHETERS) ×1 IMPLANT
IPG PACE AZUR XT DR MRI W1DR01 (Pacemaker) IMPLANT
LEAD CAPSURE NOVUS 5076-52CM (Lead) ×1 IMPLANT
LEAD SELECT SECURE 3830 383069 (Lead) IMPLANT
PACE AZURE XT DR MRI W1DR01 (Pacemaker) ×2 IMPLANT
PACK EP LATEX FREE (CUSTOM PROCEDURE TRAY) ×2
PACK EP LF (CUSTOM PROCEDURE TRAY) ×1 IMPLANT
PAD PRO RADIOLUCENT 2001M-C (PAD) ×2 IMPLANT
SELECT SECURE 3830 383069 (Lead) ×2 IMPLANT
SHEATH AVANTI 11CM 8FR (SHEATH) ×1 IMPLANT
SHEATH CLASSIC 7F (SHEATH) ×2 IMPLANT
SHEATH PINNACLE 6F 10CM (SHEATH) ×1 IMPLANT
SHEATH PINNACLE 8F 10CM (SHEATH) ×1 IMPLANT
TRAY PACEMAKER INSERTION (PACKS) ×1 IMPLANT
WIRE HI TORQ VERSACORE-J 145CM (WIRE) ×1 IMPLANT

## 2018-03-09 NOTE — Interval H&P Note (Signed)
History and Physical Interval Note:  03/09/2018 9:44 AM  Paul Frazier.  has presented today for surgery, with the diagnosis of a flutter  The various methods of treatment have been discussed with the patient and family. After consideration of risks, benefits and other options for treatment, the patient has consented to  Procedure(s): TRANSESOPHAGEAL ECHOCARDIOGRAM (TEE) (N/A) as a surgical intervention .  The patient's history has been reviewed, patient examined, no change in status, stable for surgery.  I have reviewed the patient's chart and labs.  Questions were answered to the patient's satisfaction.     Dorris Carnes

## 2018-03-09 NOTE — Interval H&P Note (Signed)
History and Physical Interval Note:  03/09/2018 11:28 AM  Paul Nap.  has presented today for surgery, with the diagnosis of aflutter  The various methods of treatment have been discussed with the patient and family. After consideration of risks, benefits and other options for treatment, the patient has consented to  Procedure(s): A-FLUTTER ABLATION (N/A) as a surgical intervention .  The patient's history has been reviewed, patient examined, no change in status, stable for surgery.  I have reviewed the patient's chart and labs.  Questions were answered to the patient's satisfaction.     Paul Frazier

## 2018-03-09 NOTE — Telephone Encounter (Signed)
-----   Message from Lonn Georgia, PA-C sent at 03/09/2018 12:18 PM EDT ----- Patient of Dr. Sallyanne Kuster, needs Myoview. I have ordered it, can you arrange it and call him?   Thank you

## 2018-03-09 NOTE — CV Procedure (Signed)
TEE  Patient's throat anesthetized with viscous lidocaine  Pt sedated with Versed and Fentanyl   TEE probe advanced to mid esophagus  LA, LAA without masses  TV normal   Triveal TR  AV mildly thickend   NO AI  MV is normal   There is at least moderate MR   LVEF is depressed, at least moderately  AV with very mild atherosclerotic plaquing  Full report to follow

## 2018-03-09 NOTE — Consult Note (Addendum)
ELECTROPHYSIOLOGY CONSULT NOTE    Patient ID: Paul Frazier. MRN: [id number], DOB/AGE: 04/07/1933 84 y.o.  Admit date: 03/06/2018 Date of Consult: 03/09/2018  Primary Physician: Unk Pinto, MD Primary Cardiologist: [place] Electrophysiologist: Lovena Le (new this admission)  Patient Profile: Paul Frazier. is a 84 y.o. male with a history of CAD s/p CABG and ischemic cardiomyopathy who is being seen today for the evaluation of atrial flutter at the request of Dr Stanford Breed.  HPI:  Paul Frazier. is a 84 y.o. male with the above past medical history.  He presented to the hospital on the day of admission with complaints of palpitations, chest tightness, and fatigue. He was found to be in new onset atrial flutter. He was placed on diltiazem drip and anticoagulation.  He is scheduled for TEE today and we have been asked to evaluate for flutter ablation.   Echo demonstrates EF 30-35%, diffuse hypokinesis, akinesis of interior and inferoseptal myocardium, moderate TR.   He currently denies chest pain, palpitations, dyspnea, PND, orthopnea, nausea, vomiting, dizziness, syncope, edema, weight gain, or early satiety.  Past Medical History:  Diagnosis Date  . Arthritis   . CAD (coronary artery disease)   . Hyperlipidemia   . Hypertension   . Old myocardial infarct   . S/P CABG x 5 08/29/1984   LIMA to LAD,SVG to diagonal,SVG to OM,SVG to PDA and PLA  . Tremor, essential      Surgical History:  Past Surgical History:  Procedure Laterality Date  . CHOLECYSTECTOMY  10/1990  . CORONARY ARTERY BYPASS GRAFT  1986   x5 LIMA to LAD,SVG to diagonal,SVG to OM,SVG to PDA and PLA  . CORONARY STENT PLACEMENT    . INGUINAL HERNIA REPAIR  04/25/2012   right- laparoscopic  . R/P Myoview  12/27/2008   no ischemia  . US ECHOCARDIOGRAPHY  12/27/2008   concentric LVH,mild to mod MR, TR and pulmonary hypertension. EF 45-50%     Medications Prior to Admission  Medication Sig Dispense  Refill Last Dose  . aspirin 81 MG tablet Take 81 mg by mouth daily.   03/06/2018 at 1100  . buPROPion (WELLBUTRIN XL) 300 MG 24 hr tablet Take 1 tablet daily for Mood a (Patient taking differently: Take 300 mg by mouth daily. ) 90 tablet 1 03/05/2018 at Unknown time  . co-enzyme Q-10 30 MG capsule Take 60 mg by mouth daily.   03/05/2018 at Unknown time  . diazepam (VALIUM) 5 MG tablet Take 1/2 to 1 tablet 2 to 3 x / day only if needed for Muscle spasms  & please try to limit to 5 days /week to avoid addiction 90 tablet 0 03/06/2018 at am  . Digestive Enzymes CAPS Take 1-2 capsules by mouth as needed (to aid digestive function).    Unk at [employer]  . Magnesium Chloride (MAGNESIUM DR PO) Take 2 tablets by mouth daily.    03/05/2018 at Unknown time  . metoprolol succinate (TOPROL-XL) 25 MG 24 hr tablet TAKE 1 TABLET BY MOUTH ONCE DAILY 90 tablet 0 03/05/2018 at 1200  . nitroGLYCERIN (NITROSTAT) 0.4 MG SL tablet Place 1 tablet (0.4 mg total) under the tongue every 5 (five) minutes as needed for chest pain. 25 tablet 3 03/06/2018 at am    Inpatient Medications:  . apixaban  5 mg Oral BID  . metoprolol succinate  25 mg Oral Daily    Allergies:  Allergies  Allergen Reactions  . Erythromycin Nausea Only    Social  History   Socioeconomic History  . Marital status: Married    Spouse name: [name]  . Number of children: 3  . Years of education: 35  . Highest education level: Not on file  Occupational History  . Occupation: Retired     Comment: retired  Scientific laboratory technician  . Financial resource strain: Somewhat hard  . Food insecurity:    Worry: Never true    Inability: Never true  . Transportation needs:    Medical: No    Non-medical: No  Tobacco Use  . Smoking status: Former Smoker    Last attempt to quit: 07/19/1970    Years since quitting: 47.6  . Smokeless tobacco: Never Used  Substance and Sexual Activity  . Alcohol use: No    Alcohol/week: 0.0 standard drinks  . Drug use: No  . Sexual  activity: Not Currently  Lifestyle  . Physical activity:    Days per week: 0 days    Minutes per session: 0 min  . Stress: To some extent  Relationships  . Social connections:    Talks on phone: More than three times a week    Gets together: Twice a week    Attends religious service: More than 4 times per year    Active member of club or organization: No    Attends meetings of clubs or organizations: Never    Relationship status: Married  . Intimate partner violence:    Fear of current or ex partner: No    Emotionally abused: No    Physically abused: No    Forced sexual activity: No  Other Topics Concern  . Not on file  Social History Narrative   Drinks 1-2 cups of coffee a day, occasional coke      Family History  Problem Relation Age of Onset  . Cancer Father        bone  . Cancer Brother        lymphnode     Review of Systems: All other systems reviewed and are otherwise negative except as noted above.  Physical Exam: Vitals:   03/08/18 1559 03/08/18 2035 03/09/18 0610 03/09/18 0805  BP: 111/72 100/72 119/63 122/70  Pulse: 70 68 69 68  Resp: 19 17 15    Temp: 98 F (36.7 C) 97.9 F (36.6 C) 97.7 F (36.5 C) (!) 97.5 F (36.4 C)  TempSrc: Oral Oral Oral Oral  SpO2: 100% 93% 96% 97%  Weight:   69.9 kg   Height:        GEN- The patient is elderly appearing, alert and oriented x 3 today.   HEENT: normocephalic, atraumatic; sclera clear, conjunctiva pink; hearing intact; oropharynx clear; neck supple Lungs- Clear to ausculation bilaterally, normal work of breathing.  No wheezes, rales, rhonchi Heart- Regular rate and rhythm  GI- soft, non-tender, non-distended, bowel sounds present Extremities- no clubbing, cyanosis, or edema  MS- no significant deformity or atrophy Skin- warm and dry, no rash or lesion Psych- euthymic mood, full affect Neuro- strength and sensation are intact  Labs:   Lab Results  Component Value Date   WBC 9.8 03/09/2018   HGB 15.3  03/09/2018   HCT 45.7 03/09/2018   MCV 94.0 03/09/2018   PLT 205 03/09/2018    Recent Labs  Lab 03/08/18 0348  NA 140  K 4.7  CL 107  CO2 28  BUN 14  CREATININE 0.98  CALCIUM 8.8*  GLUCOSE 110*      Radiology/Studies: Dg Chest 2 View  Result  Date: 03/06/2018 CLINICAL DATA:  Tachycardia EXAM: CHEST - 2 VIEW COMPARISON:  12/04/2005 FINDINGS: The heart size and mediastinal contours are within normal limits. Post CABG change noted with median sternotomy sutures in place. Both lungs are clear. The visualized skeletal structures are unremarkable. IMPRESSION: No active cardiopulmonary disease. Electronically Signed   By: Ashley Royalty M.D.   On: 03/06/2018 15:30    [id number] flutter with controlled ventricular response (personally reviewed)  TELEMETRY: atrial flutter (personally reviewed)  Assessment/Plan: 1.  Typical atrial flutter Plan for TEE today He has been anticoagulated since admission Risks, benefits to ablation reviewed with the patient who wishes to proceed. Will continue anticoagulation for 4 weeks post procedure  2.  CAD s/p CABG Stable No change required today  3.  Ischemic cardiomyopathy Euvolemic on exam Continue current therapy   Dr Lovena Le to see later today   For questions or updates, please contact [facility] Please consult [email or web address] for contact info under Cardiology/STEMI.  Signed, Chanetta Marshall, NP 03/09/2018 8:27 AM   EP Attending  Patient seen and examined. Agree with above. The patient presents with atrial flutter with a controlled VR on IV cardizem. He will undergo EP study and catheter ablation of atrial flutter. I have discussed the indications/risks/benefits/goals/expectations and he wishes to proceed. He will undergo TEE to rule out a LAA thrombus.   Mikle Bosworth.D.

## 2018-03-09 NOTE — Progress Notes (Signed)
Progress Note  Patient Name: Paul Frazier. Date of Encounter: 03/09/2018  Primary Cardiologist: Dr Sallyanne Kuster  Subjective   Pt denies CP or dyspnea  Inpatient Medications    Scheduled Meds: . apixaban  5 mg Oral BID  . aspirin EC  81 mg Oral Daily  . metoprolol succinate  25 mg Oral Daily   Continuous Infusions: . sodium chloride 5 mL/hr at 03/08/18 0651  . sodium chloride 20 mL/hr at 03/09/18 0608  . diltiazem (CARDIZEM) infusion 5 mg/hr (03/09/18 0609)   PRN Meds: sodium chloride, acetaminophen, diazepam, nitroGLYCERIN, ondansetron (ZOFRAN) IV   Vital Signs    Vitals:   03/08/18 1157 03/08/18 1559 03/08/18 2035 03/09/18 0610  BP: 111/68 111/72 100/72 119/63  Pulse: 70 70 68 69  Resp:  19 17 15   Temp: (!) 97.5 F (36.4 C) 98 F (36.7 C) 97.9 F (36.6 C) 97.7 F (36.5 C)  TempSrc: Oral Oral Oral Oral  SpO2: 100% 100% 93% 96%  Weight:    69.9 kg  Height:        Intake/Output Summary (Last 24 hours) at 03/09/2018 4403 Last data filed at 03/09/2018 0612 Gross per 24 hour  Intake 656.55 ml  Output 775 ml  Net -118.45 ml   Filed Weights   03/06/18 2217 03/08/18 0419 03/09/18 0610  Weight: 72.8 kg 69.5 kg 69.9 kg    Telemetry    Atrial flutter rate controlled; PVCs; 4 and 5 beats NSVT- Personally Reviewed  Physical Exam   GEN: NAD.  WD/WN Neck: supple Cardiac: irregular Respiratory: CTA GI: Soft, NT/ND MS: No edema Neuro:  no focal findings   Labs    Chemistry Recent Labs  Lab 03/06/18 1439 03/06/18 2350 03/07/18 0459 03/08/18 0348  NA 140 142 140 140  K 4.5 3.8 4.2 4.7  CL 107 107 109 107  CO2 26 23 26 28   GLUCOSE 123* 153* 108* 110*  BUN 16 13 14 14   CREATININE 0.91 0.88 0.87 0.98  CALCIUM 9.0 8.8* 8.6* 8.8*  GFRNONAA >60  --  >60 >60  GFRAA >60  --  >60 >60  ANIONGAP 7  --  5 5     Hematology Recent Labs  Lab 03/07/18 0459 03/08/18 0348 03/09/18 0334  WBC 9.4 8.2 9.8  RBC 4.78 4.92 4.86  HGB 14.8 15.2 15.3  HCT  44.8 46.1 45.7  MCV 93.7 93.7 94.0  MCH 31.0 30.9 31.5  MCHC 33.0 33.0 33.5  RDW 13.5 13.7 13.7  PLT 180 200 205    Cardiac Enzymes Recent Labs  Lab 03/06/18 2350 03/07/18 0459 03/07/18 1128  TROPONINI 0.08* 0.10* 0.06*    Recent Labs  Lab 03/06/18 1444  TROPIPOC 0.07    Patient Profile     84 y.o. male with past medical history of coronary artery disease status post coronary artery bypass and graft, hypertension, hyperlipidemia admitted with new onset atrial flutter and chest tightness.  Assessment & Plan    1 atrial flutter-patient remains in atrial flutter this morning.  Continue Cardizem and metoprolol for rate control.  Continue apixaban.  Plan TEE later this morning and if no thrombus proceed with atrial flutter ablation.  Would then continue apixaban for 4 weeks and discontinue long-term.  2 chest tightness-enzymes not consistent with acute coronary syndrome.  Plan nuclear study following discharge for risk stratification.    3 decreased TSH-patient will need follow-up with primary care.  Free T4 is normal.  4 ischemic cardiomyopathy-ejection fraction 30 to 35%.  Once ablation complete we will discontinue Cardizem and continue Toprol.  Will add ARB at that time.  Titrate medications as tolerated by pulse and blood pressure.  5 coronary artery disease status post coronary artery bypass and graft-discontinue aspirin given need for apixaban.  Would add statin at discharge.  For questions or updates, please contact [place] Please consult [email or web address] for contact info under Cardiology/STEMI.      Signed, Kirk Ruths, MD  03/09/2018, 7:12 AM

## 2018-03-09 NOTE — Discharge Instructions (Addendum)
Supplemental Discharge Instructions for  Pacemaker/Defibrillator Patients  Activity No heavy lifting or vigorous activity with your left/right arm for 6 to 8 weeks.  Do not raise your left/right arm above your head for one week.  Gradually raise your affected arm as drawn below.           __        03/14/18                     03/15/18                     03/16/18                   03/17/18  NO DRIVING for  1 week   ; you may begin driving on  01/14/35   .  WOUND CARE - Keep the wound area clean and dry.  Do not get this area wet for one week. No showers for one week; you may shower on   03/17/18  . - The tape/steri-strips on your wound will fall off; do not pull them off.  No bandage is needed on the site.  DO  NOT apply any creams, oils, or ointments to the wound area. - If you notice any drainage or discharge from the wound, any swelling or bruising at the site, or you develop a fever > 101? F after you are discharged home, call the office at once.  Special Instructions - You are still able to use cellular telephones; use the ear opposite the side where you have your pacemaker/defibrillator.  Avoid carrying your cellular phone near your device. - When traveling through airports, show security personnel your identification card to avoid being screened in the metal detectors.  Ask the security personnel to use the hand wand. - Avoid arc welding equipment, MRI testing (magnetic resonance imaging), TENS units (transcutaneous nerve stimulators).  Call the office for questions about other devices. - Avoid electrical appliances that are in poor condition or are not properly grounded. - Microwave ovens are safe to be near or to operate.   ================================================================  Information on my medicine - ELIQUIS (apixaban)  Why was Eliquis prescribed for you? Eliquis was prescribed for you to reduce the risk of a blood clot forming that can cause a stroke if you  have a medical condition called atrial fibrillation (a type of irregular heartbeat).  What do You need to know about Eliquis ? Take your Eliquis TWICE DAILY - one tablet in the morning and one tablet in the evening with or without food. If you have difficulty swallowing the tablet whole please discuss with your pharmacist how to take the medication safely.  Take Eliquis exactly as prescribed by your doctor and DO NOT stop taking Eliquis without talking to the doctor who prescribed the medication.  Stopping may increase your risk of developing a stroke.  Refill your prescription before you run out.  After discharge, you should have regular check-up appointments with your healthcare provider that is prescribing your Eliquis.  In the future your dose may need to be changed if your kidney function or weight changes by a significant amount or as you get older.  What do you do if you miss a dose? If you miss a dose, take it as soon as you remember on the same day and resume taking twice daily.  Do not take more than one dose of ELIQUIS at the same time to  make up a missed dose.  Important Safety Information A possible side effect of Eliquis is bleeding. You should call your healthcare provider right away if you experience any of the following: ? Bleeding from an injury or your nose that does not stop. ? Unusual colored urine (red or dark brown) or unusual colored stools (red or black). ? Unusual bruising for unknown reasons. ? A serious fall or if you hit your head (even if there is no bleeding).  Some medicines may interact with Eliquis and might increase your risk of bleeding or clotting while on Eliquis. To help avoid this, consult your healthcare provider or pharmacist prior to using any new prescription or non-prescription medications, including herbals, vitamins, non-steroidal anti-inflammatory drugs (NSAIDs) and supplements.  This website has more information on Eliquis (apixaban):  http://www.eliquis.com/eliquis/home

## 2018-03-09 NOTE — Interval H&P Note (Signed)
History and Physical Interval Note:  03/09/2018 8:04 AM  Paul Nap.  has presented today for surgery, with the diagnosis of a flutter  The various methods of treatment have been discussed with the patient and family. After consideration of risks, benefits and other options for treatment, the patient has consented to  Procedure(s): TRANSESOPHAGEAL ECHOCARDIOGRAM (TEE) (N/A) CARDIOVERSION (N/A) as a surgical intervention .  The patient's history has been reviewed, patient examined, no change in status, stable for surgery.  I have reviewed the patient's chart and labs.  Questions were answered to the patient's satisfaction.     Dorris Carnes

## 2018-03-09 NOTE — Progress Notes (Signed)
  Echocardiogram Echocardiogram Transesophageal has been performed.  Jannett Celestine 03/09/2018, 11:25 AM

## 2018-03-09 NOTE — H&P (View-Only) (Signed)
Progress Note  Patient Name: Paul Frazier. Date of Encounter: 03/09/2018  Primary Cardiologist: Dr Sallyanne Kuster  Subjective   Pt denies CP or dyspnea  Inpatient Medications    Scheduled Meds: . apixaban  5 mg Oral BID  . aspirin EC  81 mg Oral Daily  . metoprolol succinate  25 mg Oral Daily   Continuous Infusions: . sodium chloride 5 mL/hr at 03/08/18 0651  . sodium chloride 20 mL/hr at 03/09/18 0608  . diltiazem (CARDIZEM) infusion 5 mg/hr (03/09/18 0609)   PRN Meds: sodium chloride, acetaminophen, diazepam, nitroGLYCERIN, ondansetron (ZOFRAN) IV   Vital Signs    Vitals:   03/08/18 1157 03/08/18 1559 03/08/18 2035 03/09/18 0610  BP: 111/68 111/72 100/72 119/63  Pulse: 70 70 68 69  Resp:  19 17 15   Temp: (!) 97.5 F (36.4 C) 98 F (36.7 C) 97.9 F (36.6 C) 97.7 F (36.5 C)  TempSrc: Oral Oral Oral Oral  SpO2: 100% 100% 93% 96%  Weight:    69.9 kg  Height:        Intake/Output Summary (Last 24 hours) at 03/09/2018 3614 Last data filed at 03/09/2018 0612 Gross per 24 hour  Intake 656.55 ml  Output 775 ml  Net -118.45 ml   Filed Weights   03/06/18 2217 03/08/18 0419 03/09/18 0610  Weight: 72.8 kg 69.5 kg 69.9 kg    Telemetry    Atrial flutter rate controlled; PVCs; 4 and 5 beats NSVT- Personally Reviewed  Physical Exam   GEN: NAD.  WD/WN Neck: supple Cardiac: irregular Respiratory: CTA GI: Soft, NT/ND MS: No edema Neuro:  no focal findings   Labs    Chemistry Recent Labs  Lab 03/06/18 1439 03/06/18 2350 03/07/18 0459 03/08/18 0348  NA 140 142 140 140  K 4.5 3.8 4.2 4.7  CL 107 107 109 107  CO2 26 23 26 28   GLUCOSE 123* 153* 108* 110*  BUN 16 13 14 14   CREATININE 0.91 0.88 0.87 0.98  CALCIUM 9.0 8.8* 8.6* 8.8*  GFRNONAA >60  --  >60 >60  GFRAA >60  --  >60 >60  ANIONGAP 7  --  5 5     Hematology Recent Labs  Lab 03/07/18 0459 03/08/18 0348 03/09/18 0334  WBC 9.4 8.2 9.8  RBC 4.78 4.92 4.86  HGB 14.8 15.2 15.3  HCT  44.8 46.1 45.7  MCV 93.7 93.7 94.0  MCH 31.0 30.9 31.5  MCHC 33.0 33.0 33.5  RDW 13.5 13.7 13.7  PLT 180 200 205    Cardiac Enzymes Recent Labs  Lab 03/06/18 2350 03/07/18 0459 03/07/18 1128  TROPONINI 0.08* 0.10* 0.06*    Recent Labs  Lab 03/06/18 1444  TROPIPOC 0.07    Patient Profile     84 y.o. male with past medical history of coronary artery disease status post coronary artery bypass and graft, hypertension, hyperlipidemia admitted with new onset atrial flutter and chest tightness.  Assessment & Plan    1 atrial flutter-patient remains in atrial flutter this morning.  Continue Cardizem and metoprolol for rate control.  Continue apixaban.  Plan TEE later this morning and if no thrombus proceed with atrial flutter ablation.  Would then continue apixaban for 4 weeks and discontinue long-term.  2 chest tightness-enzymes not consistent with acute coronary syndrome.  Plan nuclear study following discharge for risk stratification.    3 decreased TSH-patient will need follow-up with primary care.  Free T4 is normal.  4 ischemic cardiomyopathy-ejection fraction 30 to 35%.  Once ablation complete we will discontinue Cardizem and continue Toprol.  Will add ARB at that time.  Titrate medications as tolerated by pulse and blood pressure.  5 coronary artery disease status post coronary artery bypass and graft-discontinue aspirin given need for apixaban.  Would add statin at discharge.  For questions or updates, please contact [facility] Please consult [email or web address] for contact info under Cardiology/STEMI.      Signed, Kirk Ruths, MD  03/09/2018, 7:12 AM

## 2018-03-09 NOTE — Progress Notes (Signed)
Site area: rt groin Site Prior to Removal:  Level 0 Pressure Applied For:15 minutes Manual:   yes Patient Status During Pull:  awake Post Pull Site:  Level 0  Post Pull Instructions Given: yes  Post Pull Pulses Present: venous Dressing Applied:  yes Bedrest begins @ 14:50 Comments:

## 2018-03-09 NOTE — Plan of Care (Signed)
TEE neg for clots. Aflutter ablation with PPM placed. Possible d/c 8/23.

## 2018-03-09 NOTE — Discharge Summary (Addendum)
Discharge Summary    Patient ID: Paul Frazier.,  MRN: [id number], DOB/AGE: [birth year] 82 y.o.  Admit date: 03/06/2018 Discharge date: 03/10/2018  Primary Care Provider: Unk Pinto Primary Cardiologist: Sanda Klein, MD Primary electrophysiologist: Dr. Lovena Le  Discharge Diagnoses    Active Problems:   Atrial flutter ([place])   Complete heart block ([facility])   Allergies Allergies  Allergen Reactions  . Erythromycin Nausea Only    Diagnostic Studies/Procedures    ATRIAL FLUTTER ABLATION: 03/09/2018 by Dr Lovena Le. This study demonstrated typical atrial flutter that was successfully ablated. Post ablation, he developed complete heart block and underwent pacemaker implant.    Pacemaker implantation 03/09/18 by Dr Lovena Le. He received a MDT dual chamber PPM. There were no early apparent complications.   TEE: 03/09/2018 EF moderately depressed, no LAA thrombus.  ECHO: 03/08/2018 - Left ventricle: The cavity size was normal. Wall thickness was   normal. Systolic function was moderately to severely reduced. The   estimated ejection fraction was in the range of 30% to 35%.   Diffuse hypokinesis. There is akinesis of the inferior and   inferoseptal myocardium. - Mitral valve: There was mild regurgitation. - Left atrium: The atrium was mildly dilated. - Tricuspid valve: There was moderate regurgitation. - Pulmonary arteries: Systolic pressure was mildly increased. PA   peak pressure: 35 mm Hg (S). _____________   History of Present Illness     84 y.o. male with past medical history of coronary artery disease status post coronary artery bypass and graft, hypertension, hyperlipidemia admitted with new onset atrial flutter and chest tightness.  Hospital Course     Consultants: Electrophysiology  He was started on Cardizem and metoprolol for rate control of the atrial flutter.  He was started on apixaban for anticoagulation.  Because of the apixaban, we will discontinue  aspirin.  Cardiac enzymes were minimally elevated but chest tightness was not felt consistent with acute coronary syndrome.  He will get a Myoview as an outpatient for risk stratification.  Add a statin.  His TSH was slightly low but the free T4 was normal.  He is to follow-up with primary care for this.  He has a history of ischemic cardiomyopathy with an EF 30-35% by echocardiogram this admission.  We will discontinue the Cardizem and continue the metoprolol at discharge.  Add an ARB at that time.  Titrate medicines as an outpatient.  He underwent EPS and ablation of atrial flutter on 03/09/18 by Dr Lovena Le. This was complicated by complete heart block and he received a MDT dual chamber pacemaker.   He was monitored on telemetry overnight which demonstrated sinus rhythm with V pacing. CXR demonstrated leads in stable position and no obvious ptx. He was examined by Dr Lovena Le and considered stable for discharge to home.    _____________  Discharge Vitals Blood pressure 128/64, pulse 69, temperature 98.1 F (36.7 C), temperature source Oral, resp. rate 15, height 5' 6.5" (1.689 m), weight 69.9 kg, SpO2 97 %.  Filed Weights   03/06/18 2217 03/08/18 0419 03/09/18 0610  Weight: 72.8 kg 69.5 kg 69.9 kg    Labs & Radiologic Studies    CBC Recent Labs    03/09/18 0334 03/10/18 0414  WBC 9.8 11.1*  HGB 15.3 14.0  HCT 45.7 42.5  MCV 94.0 94.2  PLT 205 295   Basic Metabolic Panel Recent Labs    03/08/18 0348  NA 140  K 4.7  CL 107  CO2 28  GLUCOSE 110*  BUN 14  CREATININE 0.98  CALCIUM 8.8*   Cardiac Enzymes Recent Labs    03/07/18 1128  TROPONINI 0.06*   Hemoglobin A1C Lab Results  Component Value Date   HGBA1C 5.6 02/02/2018    Fasting Lipid Panel No results for input(s): CHOL, HDL, LDLCALC, TRIG, CHOLHDL, LDLDIRECT in the last 72 hours. Thyroid Function Tests TSH  Date Value Ref Range Status  03/06/2018 0.342 (L) 0.350 - 4.500 uIU/mL Final    Comment:     Performed by a 3rd Generation assay with a functional sensitivity of <=0.01 uIU/mL. Performed at [facility], [place] [address], [place], [place] [address]   02/02/2018 0.47 0.40 - 4.50 mIU/L Final    No results for input(s): TSH, T4TOTAL, T3FREE, THYROIDAB in the last 72 hours.  Invalid input(s): FREET3 _____________  Dg Chest 2 View  Result Date: 03/06/2018 CLINICAL DATA:  Tachycardia EXAM: CHEST - 2 VIEW COMPARISON:  12/04/2005 FINDINGS: The heart size and mediastinal contours are within normal limits. Post CABG change noted with median sternotomy sutures in place. Both lungs are clear. The visualized skeletal structures are unremarkable. IMPRESSION: No active cardiopulmonary disease. Electronically Signed   By: Ashley Royalty M.D.   On: 03/06/2018 15:30   Disposition   Pt is being discharged home today in good condition.  Follow-up Plans & Appointments    Follow-up Information    [facility] Follow up on 03/23/2018.   Specialty:  Cardiology Why:  at [facility] information: [address] [place]       Sanda Klein, MD Follow up on 06/14/2018.   Specialty:  Cardiology Why:  at 9:15AM Contact information: [address] [place] [address] [phone]          Discharge Instructions    Diet - low sodium heart healthy   Complete by:  As directed    Increase activity slowly   Complete by:  As directed       Discharge Medications   Allergies as of 03/10/2018      Reactions   Erythromycin Nausea Only      Medication List    STOP taking these medications   aspirin 81 MG tablet     TAKE these medications   apixaban 5 MG Tabs tablet Commonly known as:  ELIQUIS Take 1 tablet (5 mg total) by mouth 2 (two) times daily.   buPROPion 300 MG 24 hr tablet Commonly known as:  WELLBUTRIN XL Take 1 tablet daily for Mood a What changed:    how much to take  how to  take this  when to take this  additional instructions   co-enzyme Q-10 30 MG capsule Take 60 mg by mouth daily.   diazepam 5 MG tablet Commonly known as:  VALIUM Take 1/2 to 1 tablet 2 to 3 x / day only if needed for Muscle spasms  & please try to limit to 5 days /week to avoid addiction   Digestive Enzymes Caps Take 1-2 capsules by mouth as needed (to aid digestive function).   losartan 25 MG tablet Commonly known as:  COZAAR Take 1 tablet (25 mg total) by mouth daily.   MAGNESIUM DR PO Take 2 tablets by mouth daily.   metoprolol succinate 25 MG 24 hr tablet Commonly known as:  TOPROL-XL TAKE 1 TABLET BY MOUTH ONCE DAILY   nitroGLYCERIN 0.4 MG SL tablet Commonly known as:  NITROSTAT Place 1 tablet (  0.4 mg total) under the tongue every 5 (five) minutes as needed for chest pain.   traMADol 50 MG tablet Commonly known as:  ULTRAM Take 1 tablet (50 mg total) by mouth every 6 (six) hours as needed for moderate pain.         Outstanding Labs/Studies   Myoview as an outpatient BMET at time of wound check  Duration of Discharge Encounter   Greater than 30 minutes including physician time.  Signed, Chanetta Marshall NP 03/10/2018, 7:07 AM  EP Attending  Patient seen and examined. Agree with above. See my prior note.   Mikle Bosworth.D.

## 2018-03-09 NOTE — H&P (View-Only) (Signed)
ELECTROPHYSIOLOGY CONSULT NOTE    Patient ID: Paul Frazier. MRN: [id number], DOB/AGE: 23-Nov-1932 84 y.o.  Admit date: 03/06/2018 Date of Consult: 03/09/2018  Primary Physician: Unk Pinto, MD Primary Cardiologist: [facility] Electrophysiologist: Lovena Le (new this admission)  Patient Profile: Paul Frazier. is a 84 y.o. male with a history of CAD s/p CABG and ischemic cardiomyopathy who is being seen today for the evaluation of atrial flutter at the request of Dr Stanford Breed.  HPI:  Paul Frazier. is a 84 y.o. male with the above past medical history.  He presented to the hospital on the day of admission with complaints of palpitations, chest tightness, and fatigue. He was found to be in new onset atrial flutter. He was placed on diltiazem drip and anticoagulation.  He is scheduled for TEE today and we have been asked to evaluate for flutter ablation.   Echo demonstrates EF 30-35%, diffuse hypokinesis, akinesis of interior and inferoseptal myocardium, moderate TR.   He currently denies chest pain, palpitations, dyspnea, PND, orthopnea, nausea, vomiting, dizziness, syncope, edema, weight gain, or early satiety.  Past Medical History:  Diagnosis Date  . Arthritis   . CAD (coronary artery disease)   . Hyperlipidemia   . Hypertension   . Old myocardial infarct   . S/P CABG x 5 08/29/1984   LIMA to LAD,SVG to diagonal,SVG to OM,SVG to PDA and PLA  . Tremor, essential      Surgical History:  Past Surgical History:  Procedure Laterality Date  . CHOLECYSTECTOMY  10/1990  . CORONARY ARTERY BYPASS GRAFT  1986   x5 LIMA to LAD,SVG to diagonal,SVG to OM,SVG to PDA and PLA  . CORONARY STENT PLACEMENT    . INGUINAL HERNIA REPAIR  04/25/2012   right- laparoscopic  . R/P Myoview  12/27/2008   no ischemia  . US ECHOCARDIOGRAPHY  12/27/2008   concentric LVH,mild to mod MR, TR and pulmonary hypertension. EF 45-50%     Medications Prior to Admission  Medication Sig Dispense  Refill Last Dose  . aspirin 81 MG tablet Take 81 mg by mouth daily.   03/06/2018 at 1100  . buPROPion (WELLBUTRIN XL) 300 MG 24 hr tablet Take 1 tablet daily for Mood a (Patient taking differently: Take 300 mg by mouth daily. ) 90 tablet 1 03/05/2018 at Unknown time  . co-enzyme Q-10 30 MG capsule Take 60 mg by mouth daily.   03/05/2018 at Unknown time  . diazepam (VALIUM) 5 MG tablet Take 1/2 to 1 tablet 2 to 3 x / day only if needed for Muscle spasms  & please try to limit to 5 days /week to avoid addiction 90 tablet 0 03/06/2018 at am  . Digestive Enzymes CAPS Take 1-2 capsules by mouth as needed (to aid digestive function).    Unk at [employer]  . Magnesium Chloride (MAGNESIUM DR PO) Take 2 tablets by mouth daily.    03/05/2018 at Unknown time  . metoprolol succinate (TOPROL-XL) 25 MG 24 hr tablet TAKE 1 TABLET BY MOUTH ONCE DAILY 90 tablet 0 03/05/2018 at 1200  . nitroGLYCERIN (NITROSTAT) 0.4 MG SL tablet Place 1 tablet (0.4 mg total) under the tongue every 5 (five) minutes as needed for chest pain. 25 tablet 3 03/06/2018 at am    Inpatient Medications:  . apixaban  5 mg Oral BID  . metoprolol succinate  25 mg Oral Daily    Allergies:  Allergies  Allergen Reactions  . Erythromycin Nausea Only    Social  History   Socioeconomic History  . Marital status: Married    Spouse name: [name]  . Number of children: 3  . Years of education: 40  . Highest education level: Not on file  Occupational History  . Occupation: Retired     Comment: retired  Scientific laboratory technician  . Financial resource strain: Somewhat hard  . Food insecurity:    Worry: Never true    Inability: Never true  . Transportation needs:    Medical: No    Non-medical: No  Tobacco Use  . Smoking status: Former Smoker    Last attempt to quit: 07/19/1970    Years since quitting: 47.6  . Smokeless tobacco: Never Used  Substance and Sexual Activity  . Alcohol use: No    Alcohol/week: 0.0 standard drinks  . Drug use: No  . Sexual  activity: Not Currently  Lifestyle  . Physical activity:    Days per week: 0 days    Minutes per session: 0 min  . Stress: To some extent  Relationships  . Social connections:    Talks on phone: More than three times a week    Gets together: Twice a week    Attends religious service: More than 4 times per year    Active member of club or organization: No    Attends meetings of clubs or organizations: Never    Relationship status: Married  . Intimate partner violence:    Fear of current or ex partner: No    Emotionally abused: No    Physically abused: No    Forced sexual activity: No  Other Topics Concern  . Not on file  Social History Narrative   Drinks 1-2 cups of coffee a day, occasional coke      Family History  Problem Relation Age of Onset  . Cancer Father        bone  . Cancer Brother        lymphnode     Review of Systems: All other systems reviewed and are otherwise negative except as noted above.  Physical Exam: Vitals:   03/08/18 1559 03/08/18 2035 03/09/18 0610 03/09/18 0805  BP: 111/72 100/72 119/63 122/70  Pulse: 70 68 69 68  Resp: 19 17 15    Temp: 98 F (36.7 C) 97.9 F (36.6 C) 97.7 F (36.5 C) (!) 97.5 F (36.4 C)  TempSrc: Oral Oral Oral Oral  SpO2: 100% 93% 96% 97%  Weight:   69.9 kg   Height:        GEN- The patient is elderly appearing, alert and oriented x 3 today.   HEENT: normocephalic, atraumatic; sclera clear, conjunctiva pink; hearing intact; oropharynx clear; neck supple Lungs- Clear to ausculation bilaterally, normal work of breathing.  No wheezes, rales, rhonchi Heart- Regular rate and rhythm  GI- soft, non-tender, non-distended, bowel sounds present Extremities- no clubbing, cyanosis, or edema  MS- no significant deformity or atrophy Skin- warm and dry, no rash or lesion Psych- euthymic mood, full affect Neuro- strength and sensation are intact  Labs:   Lab Results  Component Value Date   WBC 9.8 03/09/2018   HGB 15.3  03/09/2018   HCT 45.7 03/09/2018   MCV 94.0 03/09/2018   PLT 205 03/09/2018    Recent Labs  Lab 03/08/18 0348  NA 140  K 4.7  CL 107  CO2 28  BUN 14  CREATININE 0.98  CALCIUM 8.8*  GLUCOSE 110*      Radiology/Studies: Dg Chest 2 View  Result  Date: 03/06/2018 CLINICAL DATA:  Tachycardia EXAM: CHEST - 2 VIEW COMPARISON:  12/04/2005 FINDINGS: The heart size and mediastinal contours are within normal limits. Post CABG change noted with median sternotomy sutures in place. Both lungs are clear. The visualized skeletal structures are unremarkable. IMPRESSION: No active cardiopulmonary disease. Electronically Signed   By: Ashley Royalty M.D.   On: 03/06/2018 15:30    KDP:TELMRA flutter with controlled ventricular response (personally reviewed)  TELEMETRY: atrial flutter (personally reviewed)  Assessment/Plan: 1.  Typical atrial flutter Plan for TEE today He has been anticoagulated since admission Risks, benefits to ablation reviewed with the patient who wishes to proceed. Will continue anticoagulation for 4 weeks post procedure  2.  CAD s/p CABG Stable No change required today  3.  Ischemic cardiomyopathy Euvolemic on exam Continue current therapy   Dr Lovena Le to see later today   For questions or updates, please contact [facility] Please consult [email or web address] for contact info under Cardiology/STEMI.  Signed, Chanetta Marshall, NP 03/09/2018 8:27 AM   EP Attending  Patient seen and examined. Agree with above. The patient presents with atrial flutter with a controlled VR on IV cardizem. He will undergo EP study and catheter ablation of atrial flutter. I have discussed the indications/risks/benefits/goals/expectations and he wishes to proceed. He will undergo TEE to rule out a LAA thrombus.   Mikle Bosworth.D.

## 2018-03-09 NOTE — Telephone Encounter (Signed)
Paul Frazier ordered test in hospital and I sent message to schedulers for patient to be contacted.

## 2018-03-10 ENCOUNTER — Encounter (HOSPITAL_COMMUNITY): Payer: Self-pay | Admitting: Internal Medicine

## 2018-03-10 ENCOUNTER — Inpatient Hospital Stay (HOSPITAL_COMMUNITY): Payer: Medicare Other

## 2018-03-10 DIAGNOSIS — I442 Atrioventricular block, complete: Secondary | ICD-10-CM

## 2018-03-10 LAB — CBC
HCT: 42.5 % (ref 39.0–52.0)
Hemoglobin: 14 g/dL (ref 13.0–17.0)
MCH: 31 pg (ref 26.0–34.0)
MCHC: 32.9 g/dL (ref 30.0–36.0)
MCV: 94.2 fL (ref 78.0–100.0)
Platelets: 194 10*3/uL (ref 150–400)
RBC: 4.51 MIL/uL (ref 4.22–5.81)
RDW: 13.7 % (ref 11.5–15.5)
WBC: 11.1 10*3/uL — ABNORMAL HIGH (ref 4.0–10.5)

## 2018-03-10 MED ORDER — APIXABAN 5 MG PO TABS: 5.0000 mg | ORAL_TABLET | Freq: Two times a day (BID) | ORAL | 0 refills | Status: DC

## 2018-03-10 MED ORDER — TRAMADOL HCL 50 MG PO TABS: 50.0000 mg | ORAL_TABLET | Freq: Four times a day (QID) | ORAL | Status: DC | PRN

## 2018-03-10 MED ORDER — PHENOL 1.4 % MT LIQD
1.0000 | OROMUCOSAL | Status: DC | PRN
  Administered 2018-03-10: 1 via OROMUCOSAL
  Filled 2018-03-10: qty 177

## 2018-03-10 MED ORDER — TRAMADOL HCL 50 MG PO TABS: 50.0000 mg | ORAL_TABLET | Freq: Four times a day (QID) | ORAL | 0 refills | Status: DC | PRN

## 2018-03-10 MED ORDER — LOSARTAN POTASSIUM 25 MG PO TABS: 25.0000 mg | ORAL_TABLET | Freq: Every day | ORAL | 11 refills | Status: DC

## 2018-03-10 MED FILL — Midazolam HCl Inj 5 MG/5ML (Base Equivalent): INTRAMUSCULAR | Qty: 5 | Status: AC

## 2018-03-10 NOTE — Progress Notes (Addendum)
   Electrophysiology Rounding Note  Patient Name: Paul Frazier. Date of Encounter: 03/10/2018  Primary Cardiologist: [facility] Electrophysiologist: Lovena Le   Subjective   The patient is doing well today.  At this time, the patient denies chest pain, shortness of breath. Some throat soreness, shoulder soreness  Inpatient Medications    Scheduled Meds: . apixaban  5 mg Oral BID  . metoprolol succinate  25 mg Oral Daily   Continuous Infusions: . sodium chloride 5 mL/hr at 03/08/18 0651  . diltiazem (CARDIZEM) infusion 5 mg/hr (03/09/18 0609)   PRN Meds: sodium chloride, acetaminophen, diazepam, nitroGLYCERIN, ondansetron (ZOFRAN) IV, phenol, traMADol   Vital Signs    Vitals:   03/09/18 1519 03/09/18 2054 03/10/18 0053 03/10/18 0500  BP: 128/75 126/66 130/61 128/64  Pulse:  67 70 69  Resp: 16 16  15   Temp: (!) 97.4 F (36.3 C) 97.8 F (36.6 C) 98.4 F (36.9 C) 98.1 F (36.7 C)  TempSrc: Oral Oral Oral Oral  SpO2: 99% 100%  97%  Weight:      Height:        Intake/Output Summary (Last 24 hours) at 03/10/2018 0644 Last data filed at 03/10/2018 0507 Gross per 24 hour  Intake 300 ml  Output 600 ml  Net -300 ml   Filed Weights   03/06/18 2217 03/08/18 0419 03/09/18 0610  Weight: 72.8 kg 69.5 kg 69.9 kg    Physical Exam    GEN- The patient is elderly appearing, alert and oriented x 3 today.   Head- normocephalic, atraumatic Eyes-  Sclera clear, conjunctiva pink Ears- hearing intact Oropharynx- clear Neck- supple Lungs- Clear to ausculation bilaterally, normal work of breathing Heart- Regular rate and rhythm (paced) GI- soft, NT, ND, + BS Extremities- no clubbing, cyanosis, or edema Skin- no rash or lesion, left chest without hematoma/ecchymosis Psych- euthymic mood, full affect Neuro- strength and sensation are intact  Labs    CBC Recent Labs    03/09/18 0334 03/10/18 0414  WBC 9.8 11.1*  HGB 15.3 14.0  HCT 45.7 42.5  MCV 94.0 94.2  PLT 205  568   Basic Metabolic Panel Recent Labs    03/08/18 0348  NA 140  K 4.7  CL 107  CO2 28  GLUCOSE 110*  BUN 14  CREATININE 0.98  CALCIUM 8.8*   Liver Function Tests No results for input(s): AST, ALT, ALKPHOS, BILITOT, PROT, ALBUMIN in the last 72 hours. No results for input(s): LIPASE, AMYLASE in the last 72 hours. Cardiac Enzymes Recent Labs    03/07/18 1128  TROPONINI 0.06*     Telemetry    Sinus rhythm with V pacing  (personally reviewed)  Radiology    No results found.   Assessment & Plan    1.  Typical atrial flutter S/p ablation Continue Eliquis for 4 weeks  2.  Complete heart block Developed during ablation procedure S/p PPM CXR with leads in stable position No obvious ptx Device interrogation pending Routine wound care and follow up (reviewed with patient and entered in AVS) Will order tramadol for pain  For questions or updates, please contact [facility] Please consult [email or web address] for contact info under Cardiology/STEMI.  Signed, Chanetta Marshall, NP  03/10/2018, 6:44 AM   EP Attending  Patient seen and examined. Agree with above. The patient is stable for DC from my perspective. Usual followup. He will need to restart his systemic anti-coagulation.  Mikle Bosworth.D.

## 2018-03-10 NOTE — Consult Note (Signed)
            [facility] CM Primary Care Navigator  03/10/2018  Paul Frazier. January 26, 1933 [id number]   Went to seepatient at the bedside to identify possible discharge needs buthe was alreadydischargedper staff. Patient was discharged homewith home health services per therapy recommendation.  Per MD note,patient wasadmitted with chest tightness and new onset atrial flutter that was successfully ablated. Post ablation, patient developed complete heart block and underwent pacemaker implant.  Primary care provider's office is listed as providing transition of care (TOC) follow-up.  Patient has discharge instruction to follow-up with cardiology on 03/23/18.   For additional questions please contact:  Edwena Felty A. Ajel, BSN, RN-BC [facility] PRIMARY CARE Navigator Cell: [phone]

## 2018-03-10 NOTE — Evaluation (Signed)
Physical Therapy Evaluation Patient Details Name: Paul Frazier. MRN: [id number] DOB: 03-22-1933 Today's Date: 03/10/2018   History of Present Illness  Pt is an 84 y/o male admitted secondary to a flutter. Pt is s/p EPS and ablation, however, was complicated by complete heart block. Pt is s/p L sided pacemaker on 8/22. PMH includes CAD s/p CABG, and HTN.   Clinical Impression  Pt admitted secondary to problem above with deficits below. Mild unsteadiness noted during gait and pt requiring min guard to supervision for mobility without AD. Educated to use cane during mobility at home to increase stability. Educated in depth about pacemaker precautions and need for assist for care of his wife, as he will be unable to do so at d/c in order to maintain precautions. CSW gave resources for caregiver assist at home and pt reports a friend can come to help intermittently. Pt's very eager to return home to see his wife. Will continue to follow acutely to maximize functional mobility independence and safety.     Follow Up Recommendations Home health PT;Supervision for mobility/OOB(HHOT )    Equipment Recommendations  None recommended by PT    Recommendations for Other Services       Precautions / Restrictions Precautions Precautions: ICD/Pacemaker Precaution Comments: Reviewed pacemaker precautions with pt and educated about importance of maintaining those until cleared by MD.  Restrictions Weight Bearing Restrictions: No LUE Weight Bearing: (pacemaker precautions )      Mobility  Bed Mobility Overal bed mobility: Needs Assistance Bed Mobility: Supine to Sit     Supine to sit: Supervision     General bed mobility comments: Supervision for safety. Cues not to press through LUE to maintain precautions.   Transfers Overall transfer level: Needs assistance Equipment used: None Transfers: Sit to/from Stand Sit to Stand: Min guard         General transfer comment: Min guard for  safety. No LOB noted. Verbal cues to avoid pushing up with LUE.   Ambulation/Gait Ambulation/Gait assistance: Min guard;Supervision Gait Distance (Feet): 300 Feet Assistive device: None Gait Pattern/deviations: Step-through pattern;Decreased stride length;Drifts right/left Gait velocity: Decreased    General Gait Details: Slow, mildly unsteady gait. Tended to drift to L and R, however, no overt LOB noted. Educated to continue using cane in RUE to increase stability.   Stairs            Wheelchair Mobility    Modified Rankin (Stroke Patients Only)       Balance Overall balance assessment: Needs assistance Sitting-balance support: No upper extremity supported;Feet supported Sitting balance-Leahy Scale: Good     Standing balance support: No upper extremity supported;During functional activity Standing balance-Leahy Scale: Fair                               Pertinent Vitals/Pain Pain Assessment: No/denies pain    Home Living Family/patient expects to be discharged to:: Private residence Living Arrangements: Spouse/significant other Available Help at Discharge: Family;Available PRN/intermittently;Friend(s) Type of Home: House Home Access: Stairs to enter Entrance Stairs-Rails: None Entrance Stairs-Number of Steps: 4 Home Layout: One level Home Equipment: Walker - 2 wheels;Cane - single point;Shower seat Additional Comments: Reports they have a friend that can come over occasionally to assist with care for wife. Pt is primary caregiver for wife.     Prior Function Level of Independence: Independent with assistive device(s)         Comments: Reports he used  cane for ambulation and was primary caregiver for wife.      Hand Dominance        Extremity/Trunk Assessment   Upper Extremity Assessment Upper Extremity Assessment: LUE deficits/detail LUE Deficits / Details: Limited ROM following pacemaker precautions     Lower Extremity  Assessment Lower Extremity Assessment: Generalized weakness    Cervical / Trunk Assessment Cervical / Trunk Assessment: Kyphotic  Communication   Communication: No difficulties  Cognition Arousal/Alertness: Awake/alert Behavior During Therapy: WFL for tasks assessed/performed Overall Cognitive Status: Within Functional Limits for tasks assessed                                        General Comments General comments (skin integrity, edema, etc.): Educated in depth about pacemaker precautions and need for further assist to care for his wife and for basic IADLs at d/c. Pt was given resources from CSW for cargiver assist at home and encouraged pt to use these resources. Pt also reports they have a friend that can help occasionally.     Exercises     Assessment/Plan    PT Assessment Patient needs continued PT services  PT Problem List Decreased strength;Decreased balance;Decreased mobility;Decreased knowledge of precautions       PT Treatment Interventions DME instruction;Gait training;Stair training;Functional mobility training;Therapeutic activities;Therapeutic exercise;Balance training;Patient/family education    PT Goals (Current goals can be found in the Care Plan section)  Acute Rehab PT Goals Patient Stated Goal: to go home and see my wife  PT Goal Formulation: With patient Time For Goal Achievement: 03/24/18 Potential to Achieve Goals: Good    Frequency Min 3X/week   Barriers to discharge Decreased caregiver support      Co-evaluation               AM-PAC PT "6 Clicks" Daily Activity  Outcome Measure Difficulty turning over in bed (including adjusting bedclothes, sheets and blankets)?: None Difficulty moving from lying on back to sitting on the side of the bed? : A Little Difficulty sitting down on and standing up from a chair with arms (e.g., wheelchair, bedside commode, etc,.)?: Unable Help needed moving to and from a bed to chair  (including a wheelchair)?: A Little Help needed walking in hospital room?: A Little Help needed climbing 3-5 steps with a railing? : A Little 6 Click Score: 17    End of Session Equipment Utilized During Treatment: Gait belt Activity Tolerance: Patient tolerated treatment well Patient left: in chair;with call bell/phone within reach Nurse Communication: Mobility status PT Visit Diagnosis: Unsteadiness on feet (R26.81);Muscle weakness (generalized) (M62.81)    Time: 7342-8768 PT Time Calculation (min) (ACUTE ONLY): 30 min   Charges:   PT Evaluation $PT Eval Low Complexity: 1 Low PT Treatments $Gait Training: 8-22 mins        Leighton Ruff, PT, DPT  Acute Rehabilitation Services  Pager: [phone]   Rudean Hitt 03/10/2018, 11:21 AM

## 2018-03-10 NOTE — Progress Notes (Addendum)
8638 Bedside shift report, pt sleeping, easy to arouse. NAD, no complaints, updated with POC, WCTM.   0800 Pt assessed, see flow sheet, left arm in sling, dressing to left chest CDI. Awaiting MD to see pt.   0830 Discharge orders received, pt c/o throat and shoulder pain, will medicated per MAR.   1000 Pt stated he hadn't been out of bed for 4 days. Spoke to MD and PT consult ordered. PT to see pt  1030 Pt up walking with PT. PT recommends home with [facility]. Spoke to case management, setting up therapy.   1200 Pt discharged home, reviewed home meds and list, new medications, wound care instructions, and follow up appointments. Also re-eduated pt about post pacer insertion precautions. Pt understands without assistance. Awaiting son to pick up pt.   1255 Pt down to car with all personal belongings, including cell phone, charger, pacer supplies, pt wearing clothing, glasses, and dentures. NAD on time of discharge.

## 2018-03-10 NOTE — Care Management Note (Addendum)
Case Management Note  Patient Details  Name: Paul Frazier. MRN: [id number] Date of Birth: 1933-02-06  Subjective/Objective:   84 y.o. male with past medical history of CAD s/p CABG and ischemic cardiomyopathy, who presented to the hospital with complaints of palpitations, chest tightness, and fatigue and was found to be in new onset atrial flutter. Patient lived at home with spouse, ambulates with a cane and was the primary care giver for his wife.               Action/Plan: CM met with patient to discuss transitional needs. Eliquis 30 day free card provided; updated patient on estimated monthly copay cost of $40, with patient verbalizing understanding. Patient indicated to CM he's doubtful in his ability to ambulate since his admission d/t increased deconditioning and was agreeable to being evaluated by PT prior to transition home. CM requested primary nurse to obtain a PT order for imminent eval for home recommendations. Patient indicated his spouse has aide services with [facility], and his son can provide transportation home. CM will continue to follow.    Expected Discharge Date:  03/10/18               Expected Discharge Plan:  Home/Self Care  In-House Referral:  NA  Discharge planning Services  CM Consult  Post Acute Care Choice:  Home Health Choice offered to:  patient  DME Arranged:  N/A DME Agency:  NA  HH Arranged:  PT/OT HH Agency:  [facility]  Status of Service: CM will sign off.  If discussed at [place] of Stay Meetings, dates discussed:    Additional Comments: 03/10/18 @ 1124-Natalie Gay RNCM-PT eval complete, with HHPT/OT recommended at home. CM met with patient with preference list provided, [facility] selected by patient. HHPT/OT and F2F orders needed, with primary nurse Stanton Kidney contacted to assist with obtaining order. HH referral given to Tom[facility] Rumps [facility]) with a SOC in 24-48 hours. CM will sign off.  Midge Minium RN, BSN, NCM-BC,  ACM-RN [phone] 03/10/2018, 10:04 AM

## 2018-03-17 DIAGNOSIS — I483 Typical atrial flutter: Secondary | ICD-10-CM | POA: Diagnosis not present

## 2018-03-17 DIAGNOSIS — Z48812 Encounter for surgical aftercare following surgery on the circulatory system: Secondary | ICD-10-CM | POA: Diagnosis not present

## 2018-03-21 DIAGNOSIS — Z48812 Encounter for surgical aftercare following surgery on the circulatory system: Secondary | ICD-10-CM | POA: Diagnosis not present

## 2018-03-21 DIAGNOSIS — I483 Typical atrial flutter: Secondary | ICD-10-CM | POA: Diagnosis not present

## 2018-03-23 ENCOUNTER — Ambulatory Visit (INDEPENDENT_AMBULATORY_CARE_PROVIDER_SITE_OTHER): Payer: Medicare Other | Admitting: *Deleted

## 2018-03-23 DIAGNOSIS — I442 Atrioventricular block, complete: Secondary | ICD-10-CM

## 2018-03-23 DIAGNOSIS — Z48812 Encounter for surgical aftercare following surgery on the circulatory system: Secondary | ICD-10-CM | POA: Diagnosis not present

## 2018-03-23 DIAGNOSIS — I483 Typical atrial flutter: Secondary | ICD-10-CM | POA: Diagnosis not present

## 2018-03-23 DIAGNOSIS — Z950 Presence of cardiac pacemaker: Secondary | ICD-10-CM | POA: Diagnosis not present

## 2018-03-23 LAB — CUP PACEART INCLINIC DEVICE CHECK
Battery Remaining Longevity: 81 mo
Battery Voltage: 3.18 V
Brady Statistic AP VP Percent: 51.66 %
Brady Statistic AP VS Percent: 0 %
Brady Statistic AS VP Percent: 47.52 %
Brady Statistic AS VS Percent: 0.82 %
Brady Statistic RA Percent Paced: 52.38 %
Brady Statistic RV Percent Paced: 99.18 %
Date Time Interrogation Session: 20190905174018
Implantable Lead Implant Date: 20190822
Implantable Lead Implant Date: 20190822
Implantable Lead Location: 753859
Implantable Lead Location: 753860
Implantable Lead Model: 3830
Implantable Lead Model: 5076
Implantable Pulse Generator Implant Date: 20190822
Lead Channel Impedance Value: 399 Ohm
Lead Channel Impedance Value: 456 Ohm
Lead Channel Impedance Value: 532 Ohm
Lead Channel Impedance Value: 608 Ohm
Lead Channel Pacing Threshold Amplitude: 0.5 V
Lead Channel Pacing Threshold Amplitude: 0.5 V
Lead Channel Pacing Threshold Pulse Width: 0.4 ms
Lead Channel Pacing Threshold Pulse Width: 1 ms
Lead Channel Sensing Intrinsic Amplitude: 5.125 mV
Lead Channel Sensing Intrinsic Amplitude: 8.25 mV
Lead Channel Setting Pacing Amplitude: 3.5 V
Lead Channel Setting Pacing Amplitude: 3.5 V
Lead Channel Setting Pacing Pulse Width: 1 ms
Lead Channel Setting Sensing Sensitivity: 2.8 mV

## 2018-03-23 NOTE — Progress Notes (Signed)
Wound check appointment. Steri-strips removed. Wound without redness or edema. Stitch removed from medial incision, antibiotic ointment and bandaid applied. Incision edges otherwise approximated and healing well. Patient instructed to wash site daily and reapply antibiotic ointment and bandaid for 2-3 days, then keep site OTA. Wound recheck on 03/31/18 while GT in office. Patient aware to call in the interim with any signs/symptoms of infection.  Normal device function. Thresholds, sensing, and impedances consistent with implant measurements. RV (His) capture appears non-selective until LOC. RA output programmed at 3.5V with autocapture programmed on and RV (His) output fixed at 3.5V (monitor only) for extra safety margin until 3 month f/u. Histogram distribution appropriate for patient and level of activity. <0.1% AT/AF, no EGMs, +Eliquis. No high ventricular rates noted. Patient educated about wound care, arm mobility, lifting restrictions, and Carelink monitor. ROV with [place] on 06/14/18.

## 2018-03-23 NOTE — Patient Instructions (Addendum)
Wash site daily with soap and water when you shower. For the next 2-3 days (until Sunday), keep site covered with antibiotic ointment and a bandaid. After Sunday, you can leave site open to air (uncovered). Call the [facility] at [phone] if you notice any signs/symptoms of infection, including drainage, redness, swelling, fever, or chills. Wound recheck on Friday, 03/31/18 at 2:00pm.

## 2018-03-27 ENCOUNTER — Other Ambulatory Visit: Payer: Self-pay | Admitting: Internal Medicine

## 2018-03-27 DIAGNOSIS — M62838 Other muscle spasm: Secondary | ICD-10-CM

## 2018-03-27 MED ORDER — DIAZEPAM 5 MG PO TABS: ORAL_TABLET | ORAL | 0 refills | Status: DC

## 2018-03-28 DIAGNOSIS — I483 Typical atrial flutter: Secondary | ICD-10-CM | POA: Diagnosis not present

## 2018-03-28 DIAGNOSIS — Z48812 Encounter for surgical aftercare following surgery on the circulatory system: Secondary | ICD-10-CM | POA: Diagnosis not present

## 2018-03-30 DIAGNOSIS — Z48812 Encounter for surgical aftercare following surgery on the circulatory system: Secondary | ICD-10-CM | POA: Diagnosis not present

## 2018-03-30 DIAGNOSIS — I483 Typical atrial flutter: Secondary | ICD-10-CM | POA: Diagnosis not present

## 2018-03-30 NOTE — Progress Notes (Signed)
This encounter was created in error - please disregard.

## 2018-03-31 ENCOUNTER — Ambulatory Visit (INDEPENDENT_AMBULATORY_CARE_PROVIDER_SITE_OTHER): Payer: Medicare Other | Admitting: *Deleted

## 2018-03-31 DIAGNOSIS — I442 Atrioventricular block, complete: Secondary | ICD-10-CM

## 2018-03-31 NOTE — Progress Notes (Signed)
Wound re-check, no stitch visible incision healed no redness or edema noted.

## 2018-04-07 ENCOUNTER — Telehealth (HOSPITAL_COMMUNITY): Payer: Self-pay

## 2018-04-07 ENCOUNTER — Ambulatory Visit: Payer: Medicare Other | Admitting: Internal Medicine

## 2018-04-07 NOTE — Telephone Encounter (Signed)
Encounter complete. 

## 2018-04-11 DIAGNOSIS — Z48812 Encounter for surgical aftercare following surgery on the circulatory system: Secondary | ICD-10-CM | POA: Diagnosis not present

## 2018-04-11 DIAGNOSIS — I483 Typical atrial flutter: Secondary | ICD-10-CM | POA: Diagnosis not present

## 2018-04-12 ENCOUNTER — Ambulatory Visit (HOSPITAL_COMMUNITY)
Admission: RE | Admit: 2018-04-12 | Discharge: 2018-04-12 | Disposition: A | Discharge status: Home / Self Care | Payer: Medicare Other | Source: Ambulatory Visit | Attending: Cardiovascular Disease | Admitting: Cardiovascular Disease

## 2018-04-12 DIAGNOSIS — R079 Chest pain, unspecified: Secondary | ICD-10-CM | POA: Diagnosis not present

## 2018-04-12 LAB — MYOCARDIAL PERFUSION IMAGING
LV dias vol: 166 mL (ref 62–150)
LV sys vol: 116 mL
Peak HR: 68 {beats}/min
Rest HR: 60 {beats}/min
SDS: 5
SRS: 1
SSS: 6
TID: 1.18

## 2018-04-12 MED ORDER — TECHNETIUM TC 99M TETROFOSMIN IV KIT
8.8000 | PACK | Freq: Once | INTRAVENOUS | Status: AC | PRN
  Administered 2018-04-12: 8.8 via INTRAVENOUS
  Filled 2018-04-12: qty 9

## 2018-04-12 MED ORDER — TECHNETIUM TC 99M TETROFOSMIN IV KIT
31.8000 | PACK | Freq: Once | INTRAVENOUS | Status: AC | PRN
  Administered 2018-04-12: 31.8 via INTRAVENOUS
  Filled 2018-04-12: qty 32

## 2018-04-12 MED ORDER — REGADENOSON 0.4 MG/5ML IV SOLN
0.4000 mg | Freq: Once | INTRAVENOUS | Status: AC
  Administered 2018-04-12: 0.4 mg via INTRAVENOUS

## 2018-04-13 DIAGNOSIS — Z48812 Encounter for surgical aftercare following surgery on the circulatory system: Secondary | ICD-10-CM | POA: Diagnosis not present

## 2018-04-13 DIAGNOSIS — I483 Typical atrial flutter: Secondary | ICD-10-CM | POA: Diagnosis not present

## 2018-04-17 ENCOUNTER — Other Ambulatory Visit: Payer: Self-pay | Admitting: Nurse Practitioner

## 2018-04-17 ENCOUNTER — Telehealth: Payer: Self-pay | Admitting: Cardiovascular Disease

## 2018-04-17 NOTE — Telephone Encounter (Signed)
Pt came into office upset stating he has been trying to get through on the phone the get the result of his stress test. Pt updated with result. Verbalized understanding.      Result Notes for Myocardial Perfusion Imaging   Notes recorded by Sanda Klein, MD on 04/14/2018 at 4:45 PM EDT Reduced EF is similar to his last echo. Perfusion defects are similar to previous description, no reversible defects. No reason for urgent cath. Discuss at follow up ------  Notes recorded by Harold Hedge, CMA on 04/14/2018 at 4:14 PM EDT Left message for patient to contact office. ------  Notes recorded by Lonn Georgia, PA-C on 04/14/2018 at 3:54 PM EDT Please let him know that his stress test is described as high risk, but that is probably just because his heart muscle function is weak. Please let him know that I will forward this to Dr. Sallyanne Kuster, Dr. Loletha Grayer will advise if any further work-up is needed. Otherwise, keep follow-up appointments and let us know if he has additional symptoms. Thanks

## 2018-04-17 NOTE — Telephone Encounter (Signed)
Follow Up:      Returning your call from Friday, concerning his Stress Test results.

## 2018-04-17 NOTE — Telephone Encounter (Signed)
This is Dr. Croitoru's pt 

## 2018-04-18 DIAGNOSIS — Z48812 Encounter for surgical aftercare following surgery on the circulatory system: Secondary | ICD-10-CM | POA: Diagnosis not present

## 2018-04-18 DIAGNOSIS — I483 Typical atrial flutter: Secondary | ICD-10-CM | POA: Diagnosis not present

## 2018-04-25 ENCOUNTER — Telehealth: Payer: Self-pay | Admitting: Internal Medicine

## 2018-04-25 DIAGNOSIS — I483 Typical atrial flutter: Secondary | ICD-10-CM | POA: Diagnosis not present

## 2018-04-25 DIAGNOSIS — Z48812 Encounter for surgical aftercare following surgery on the circulatory system: Secondary | ICD-10-CM | POA: Diagnosis not present

## 2018-04-25 NOTE — Telephone Encounter (Signed)
New Message        Patient is calling today to see if he is still on restrictions? Pls call and advise.

## 2018-04-25 NOTE — Telephone Encounter (Signed)
Informed pt that it has been 6 weeks since his device implant therefore he has no more restrictions regarding arm movement or lifting pt voiced understanding

## 2018-05-03 DIAGNOSIS — I483 Typical atrial flutter: Secondary | ICD-10-CM | POA: Diagnosis not present

## 2018-05-03 DIAGNOSIS — Z48812 Encounter for surgical aftercare following surgery on the circulatory system: Secondary | ICD-10-CM | POA: Diagnosis not present

## 2018-05-10 DIAGNOSIS — I483 Typical atrial flutter: Secondary | ICD-10-CM | POA: Diagnosis not present

## 2018-05-10 DIAGNOSIS — Z48812 Encounter for surgical aftercare following surgery on the circulatory system: Secondary | ICD-10-CM | POA: Diagnosis not present

## 2018-05-10 NOTE — Progress Notes (Signed)
Patient ID: [name]., male   DOB: [birth year], 84 y.o.   MRN: [id number]  Assessment and Plan:    Coronary artery disease involving native coronary artery of native heart with other form of angina pectoris ([place]) Control blood pressure, cholesterol, glucose, increase exercise.  Has stress test, has not had cath, no CP/SOB  Nonsustained ventricular tachycardia (HCC) Following up with pacemaker  Atherosclerosis of aorta (HCC) Control blood pressure, cholesterol, glucose, increase exercise.   Typical atrial flutter (HCC) S/p ablation, now on elliquis, given 30 days free card and will follow up with cardio next month  Complete heart block [facility]) S/p pacemaker, doig well  Essential hypertension - continue medications, DASH diet, exercise and monitor at home. Call if greater than 130/80.  -     CBC with Differential/Platelet -     COMPLETE METABOLIC PANEL WITH GFR -     TSH  Hyperlipidemia check lipids decrease fatty foods increase activity.  -     Lipid panel  Recurrent major depressive disorder, in full remission ([place]) - continue medications, stress management techniques discussed, increase water, good sleep hygiene discussed, increase exercise, and increase veggies.   Other abnormal glucose -     Hemoglobin A1c Discussed disease progression and risks Discussed diet/exercise, weight management and risk modification   Needs flu shot -     Flu vaccine HIGH DOSE PF  Need for prophylactic vaccination against Streptococcus pneumoniae (pneumococcus) -     Pneumococcal conjugate vaccine 13-valent IM  Senile purpura ([place]) Discussed process, protect skin, sunscreen  Chronic bilateral low back pain with bilateral sciatica -     gabapentin (NEURONTIN) 100 MG capsule; Take 1 capsule (100 mg total) by mouth 3 (three) times daily. -     Ambulatory referral to Orthopedics - will refer for physical therapy for back pain and balance - difficult for the patient to leave the home     Continue diet and meds as discussed. Further disposition pending results of labs. Future Appointments  Date Time Provider [place]  06/14/2018  9:20 AM Croitoru, Dani Gobble, MD [facility]  08/14/2018  3:30 PM Unk Pinto, MD [facility] None  03/12/2019  3:00 PM Unk Pinto, MD [facility] None    HPI 84 y.o. male  presents for 3 month follow up with hypertension, hyperlipidemia, prediabetes and vitamin D.  Patient had a flutter and CHB in July, he is s/p pacemaker and ablation, following with Dr. Sallyanne Kuster. He had a stress test 04/12/2018, had EF 30-44%. No CP or SOB. He is on elliquis now but states that it is too expensive, 100 dollars a month.   He can not take NSAIDS, requesting tramadol to take once a day. He has lower back pain, bilateral numbness/tingling. Had MRI back 2018. Will refer to ortho/pain management.    His blood pressure has been controlled at home, today their BP is BP: 126/70.   He does not workout.  He reports that he and his wife have signed up for the [facility].   He denies chest pain, shortness of breath, dizziness.  He is on cholesterol medication and denies myalgias. His cholesterol is not at goal. The cholesterol last visit was:   Lab Results  Component Value Date   CHOL 147 03/07/2018   HDL 46 03/07/2018   LDLCALC 89 03/07/2018   TRIG 62 03/07/2018   CHOLHDL 3.2 03/07/2018    He has not been working on diet and exercise for prediabetes, and denies foot ulcerations, hyperglycemia, hypoglycemia ,  increased appetite, nausea, paresthesia of the feet, polydipsia, polyuria, visual disturbances, vomiting and weight loss. Last A1C in the office was:  Lab Results  Component Value Date   HGBA1C 5.6 02/02/2018   Patient is on Vitamin D supplement.  Lab Results  Component Value Date   VD25OH 60 02/02/2018     Had kidney stone in Nov, follows with [facility].  He has had some numbness/tingling in his toes, he has had several falls,  following with neurology.  Lab Results  Component Value Date   TSH 0.342 (L) 03/06/2018    Current Medications:  Current Outpatient Medications on File Prior to Visit  Medication Sig Dispense Refill  . buPROPion (WELLBUTRIN XL) 300 MG 24 hr tablet Take 1 tablet daily for Mood a (Patient taking differently: Take 300 mg by mouth daily. ) 90 tablet 1  . co-enzyme Q-10 30 MG capsule Take 60 mg by mouth daily.    . diazepam (VALIUM) 5 MG tablet Take 1/2 to 1 tablet 2 to 3 x / day only if needed for Muscle spasms  & please try to limit to 5 days /week to avoid addiction 90 tablet 0  . Digestive Enzymes CAPS Take 1-2 capsules by mouth as needed (to aid digestive function).     [name] ELIQUIS 5 MG TABS tablet TAKE 1 TABLET BY MOUTH TWICE DAILY 180 tablet 0  . losartan (COZAAR) 25 MG tablet Take 1 tablet (25 mg total) by mouth daily. 30 tablet 11  . Magnesium Chloride (MAGNESIUM DR PO) Take 2 tablets by mouth daily.     . metoprolol succinate (TOPROL-XL) 25 MG 24 hr tablet TAKE 1 TABLET BY MOUTH ONCE DAILY 90 tablet 0  . nitroGLYCERIN (NITROSTAT) 0.4 MG SL tablet Place 1 tablet (0.4 mg total) under the tongue every 5 (five) minutes as needed for chest pain. 25 tablet 3   No current facility-administered medications on file prior to visit.     Medical History:  Past Medical History:  Diagnosis Date  . Arthritis   . CAD (coronary artery disease)   . Hyperlipidemia   . Hypertension   . Old myocardial infarct   . S/P CABG x 5 08/29/1984   LIMA to LAD,SVG to diagonal,SVG to OM,SVG to PDA and PLA  . Tremor, essential     Allergies:  Allergies  Allergen Reactions  . Erythromycin Nausea Only     Review of Systems:  Review of Systems  Constitutional: Negative for chills, diaphoresis, fever, malaise/fatigue and weight loss.  HENT: Negative for congestion, ear discharge, ear pain and sore throat.   Eyes: Negative.   Respiratory: Negative for cough, shortness of breath and wheezing.    Cardiovascular: Negative for chest pain, palpitations and leg swelling.  Gastrointestinal: Negative for abdominal pain, blood in stool, constipation, diarrhea, heartburn, melena, nausea and vomiting.  Genitourinary: Negative.   Musculoskeletal: Positive for joint pain (right shoulder only at night) and myalgias (at night in feet). Negative for back pain, falls and neck pain.  Skin: Negative.  Negative for rash.  Neurological: Positive for tingling, tremors and sensory change. Negative for dizziness, speech change, focal weakness, seizures, loss of consciousness, weakness and headaches.  Psychiatric/Behavioral: Positive for depression and memory loss. Negative for hallucinations, substance abuse and suicidal ideas. The patient is nervous/anxious. The patient does not have insomnia.     Family history- Review and unchanged  Social history- Review and unchanged  Physical Exam: BP 126/70   Pulse 64   Temp 98.2 F (  36.8 C)   Resp 12   Ht 5\' 6"  (1.676 m)   Wt 166 lb (75.3 kg)   SpO2 94%   BMI 26.79 kg/m  Wt Readings from Last 3 Encounters:  05/11/18 166 lb (75.3 kg)  04/12/18 154 lb (69.9 kg)  03/09/18 154 lb 1.6 oz (69.9 kg)    General Appearance: Well nourished well developed, in no apparent distress. Eyes: PERRLA, EOMs, conjunctiva no swelling or erythema ENT/Mouth: Ear canals normal without obstruction, swelling, erythma, discharge.  TMs normal bilaterally.  Oropharynx moist, clear, without exudate, or postoropharyngeal swelling. Neck: Supple, thyroid normal,no cervical adenopathy  Respiratory: Respiratory effort normal, Breath sounds clear A&P without rhonchi, wheeze, or rale.  No retractions, no accessory usage. Cardio: RRR with no MRGs. Brisk peripheral pulses without edema.  Abdomen: Soft, + BS,  Non tender, no guarding, rebound, hernias, masses. Musculoskeletal: Full ROM, 4/5 strength, Normal abnormal, imbalance.  Skin: senile purpura bilateral arms.  Warm, dry without  rashes, lesions, ecchymosis.  Neuro: Awake and oriented X 3, Cranial nerves intact. Normal muscle tone, no cerebellar symptoms, minor intention tremor Psych: depressed affect, Insight and Judgment appropriate.    Vicie Mutters, PA-C 4:10 PM [facility]

## 2018-05-11 ENCOUNTER — Encounter: Payer: Self-pay | Admitting: Physician Assistant

## 2018-05-11 ENCOUNTER — Ambulatory Visit (INDEPENDENT_AMBULATORY_CARE_PROVIDER_SITE_OTHER): Payer: Medicare Other | Admitting: Physician Assistant

## 2018-05-11 VITALS — BP 126/70 | HR 64 | Temp 98.2°F | Resp 12 | Ht 66.0 in | Wt 166.0 lb

## 2018-05-11 DIAGNOSIS — I25118 Atherosclerotic heart disease of native coronary artery with other forms of angina pectoris: Secondary | ICD-10-CM

## 2018-05-11 DIAGNOSIS — D692 Other nonthrombocytopenic purpura: Secondary | ICD-10-CM | POA: Insufficient documentation

## 2018-05-11 DIAGNOSIS — R7309 Other abnormal glucose: Secondary | ICD-10-CM | POA: Diagnosis not present

## 2018-05-11 DIAGNOSIS — I472 Ventricular tachycardia: Secondary | ICD-10-CM | POA: Diagnosis not present

## 2018-05-11 DIAGNOSIS — F3342 Major depressive disorder, recurrent, in full remission: Secondary | ICD-10-CM | POA: Diagnosis not present

## 2018-05-11 DIAGNOSIS — I483 Typical atrial flutter: Secondary | ICD-10-CM | POA: Diagnosis not present

## 2018-05-11 DIAGNOSIS — M5441 Lumbago with sciatica, right side: Secondary | ICD-10-CM

## 2018-05-11 DIAGNOSIS — Z23 Encounter for immunization: Secondary | ICD-10-CM | POA: Diagnosis not present

## 2018-05-11 DIAGNOSIS — I442 Atrioventricular block, complete: Secondary | ICD-10-CM

## 2018-05-11 DIAGNOSIS — Z79899 Other long term (current) drug therapy: Secondary | ICD-10-CM

## 2018-05-11 DIAGNOSIS — E782 Mixed hyperlipidemia: Secondary | ICD-10-CM

## 2018-05-11 DIAGNOSIS — M5442 Lumbago with sciatica, left side: Secondary | ICD-10-CM

## 2018-05-11 DIAGNOSIS — R2689 Other abnormalities of gait and mobility: Secondary | ICD-10-CM

## 2018-05-11 DIAGNOSIS — I700 Atherosclerosis of aorta: Secondary | ICD-10-CM | POA: Diagnosis not present

## 2018-05-11 DIAGNOSIS — G8929 Other chronic pain: Secondary | ICD-10-CM

## 2018-05-11 DIAGNOSIS — I10 Essential (primary) hypertension: Secondary | ICD-10-CM | POA: Diagnosis not present

## 2018-05-11 DIAGNOSIS — I4729 Other ventricular tachycardia: Secondary | ICD-10-CM

## 2018-05-11 MED ORDER — GABAPENTIN 100 MG PO CAPS: 100.0000 mg | ORAL_CAPSULE | Freq: Three times a day (TID) | ORAL | 0 refills | Status: DC

## 2018-05-11 NOTE — Patient Instructions (Addendum)
Can take the gabapentin for nerve pain. It can make you sleepy so we suggest trying it at night first and please plan to not drive or do anything strenuous. Also please do not take this medication with alcohol.  Start out 1 pill at night before bed, can increase to 2 pills at night before bed. Please call the office if you have any side effects.   Can take 3 pills a day however you would like  Some examples: - 1 breakfast, lunch, bedtime. - 1 at breakfast, 2 at bed time   Go to the ER if you have any new weakness in your legs, have trouble controlling your urine or bowels, or have worsening pain.    Go to the ER if any chest pain, shortness of breath, nausea, dizziness, severe HA, changes vision/speech   Use a dropper or use a cap to put peroxide, olive oil,mineral oil or canola oil in the effected ear- 2-3 times a week. Let it soak for 20-30 min then you can take a shower or use a baby bulb with warm water to wash out the ear wax.  Do not use Qtips  Here is some information to help you keep your heart healthy: Move it! - Aim for 30 mins of activity every day. Take it slowly at first. Talk to [place] before starting any new exercise program.   Lose it.  -Body Mass Index (BMI) can indicate if you need to lose weight. A healthy range is 18.5-24.9. For a BMI calculator, go to [employer]  Waist Management -Excess abdominal fat is a risk factor for heart disease, diabetes, asthma, stroke and more. Ideal waist circumference is less than 35" for women and less than 40" for men.   Eat Right -focus on fruits, vegetables, whole grains, and meals you make yourself. Avoid foods with trans fat and high sugar/sodium content.   Snooze or Snore? - Loud snoring can be a sign of sleep apnea, a significant risk factor for high blood pressure, heart attach, stroke, and heart arrhythmias.  Kick the habit -Quit Smoking! Avoid second hand smoke. A single cigarette raises your blood pressure for 20 mins and  increases the risk of heart attack and stroke for the next 24 hours.   Are Aspirin and Supplements right for you? -Add ENTERIC COATED low dose 81 mg Aspirin daily OR can do every other day if you have easy bruising to protect your heart and head. As well as to reduce risk of Colon Cancer by 20 %, Skin Cancer by 26 % , Melanoma by 46% and Pancreatic cancer by 60%  Say "No to Stress -There may be little you can do about problems that cause stress. However, techniques such as long walks, meditation, and exercise can help you manage it.   Start Now! - Make changes one at a time and set reasonable goals to increase your likelihood of success.

## 2018-05-12 LAB — LIPID PANEL
Cholesterol: 185 mg/dL (ref <200)
HDL: 46 mg/dL (ref >40)
LDL Cholesterol (Calc): 105 mg/dL (calc) — ABNORMAL HIGH
Non-HDL Cholesterol (Calc): 139 mg/dL (calc) — ABNORMAL HIGH (ref <130)
Total CHOL/HDL Ratio: 4 (calc) (ref <5.0)
Triglycerides: 225 mg/dL — ABNORMAL HIGH (ref <150)

## 2018-05-12 LAB — CBC WITH DIFFERENTIAL/PLATELET
Basophils Absolute: 37 cells/uL (ref 0–200)
Basophils Relative: 0.5 %
Eosinophils Absolute: 148 cells/uL (ref 15–500)
Eosinophils Relative: 2 %
HCT: 43.2 % (ref 38.5–50.0)
Hemoglobin: 14.7 g/dL (ref 13.2–17.1)
Lymphs Abs: 2331 cells/uL (ref 850–3900)
MCH: 30.8 pg (ref 27.0–33.0)
MCHC: 34 g/dL (ref 32.0–36.0)
MCV: 90.4 fL (ref 80.0–100.0)
MPV: 10.8 fL (ref 7.5–12.5)
Monocytes Relative: 10.7 %
Neutro Abs: 4092 cells/uL (ref 1500–7800)
Neutrophils Relative %: 55.3 %
Platelets: 167 10*3/uL (ref 140–400)
RBC: 4.78 10*6/uL (ref 4.20–5.80)
RDW: 13.2 % (ref 11.0–15.0)
Total Lymphocyte: 31.5 %
WBC mixed population: 792 cells/uL (ref 200–950)
WBC: 7.4 10*3/uL (ref 3.8–10.8)

## 2018-05-12 LAB — HEMOGLOBIN A1C
Hgb A1c MFr Bld: 5.7 % of total Hgb — ABNORMAL HIGH (ref <5.7)
Mean Plasma Glucose: 117 (calc)
eAG (mmol/L): 6.5 (calc)

## 2018-05-12 LAB — COMPLETE METABOLIC PANEL WITH GFR
AG Ratio: 1.5 (calc) (ref 1.0–2.5)
ALT: 34 U/L (ref 9–46)
AST: 26 U/L (ref 10–35)
Albumin: 3.9 g/dL (ref 3.6–5.1)
Alkaline phosphatase (APISO): 56 U/L (ref 40–115)
BUN: 24 mg/dL (ref 7–25)
CO2: 27 mmol/L (ref 20–32)
Calcium: 9.2 mg/dL (ref 8.6–10.3)
Chloride: 104 mmol/L (ref 98–110)
Creat: 1.07 mg/dL (ref 0.70–1.11)
GFR, Est African American: 74 mL/min/{1.73_m2} (ref >=60)
GFR, Est Non African American: 64 mL/min/{1.73_m2} (ref >=60)
Globulin: 2.6 g/dL (calc) (ref 1.9–3.7)
Glucose, Bld: 101 mg/dL — ABNORMAL HIGH (ref 65–99)
Potassium: 4.4 mmol/L (ref 3.5–5.3)
Sodium: 138 mmol/L (ref 135–146)
Total Bilirubin: 0.4 mg/dL (ref 0.2–1.2)
Total Protein: 6.5 g/dL (ref 6.1–8.1)

## 2018-05-12 LAB — TSH: TSH: 0.51 mIU/L (ref 0.40–4.50)

## 2018-05-18 ENCOUNTER — Encounter: Payer: Self-pay | Admitting: Internal Medicine

## 2018-05-18 DIAGNOSIS — I447 Left bundle-branch block, unspecified: Secondary | ICD-10-CM | POA: Diagnosis not present

## 2018-05-18 DIAGNOSIS — F325 Major depressive disorder, single episode, in full remission: Secondary | ICD-10-CM | POA: Diagnosis not present

## 2018-05-18 DIAGNOSIS — Z7901 Long term (current) use of anticoagulants: Secondary | ICD-10-CM | POA: Diagnosis not present

## 2018-05-18 DIAGNOSIS — Z87891 Personal history of nicotine dependence: Secondary | ICD-10-CM | POA: Diagnosis not present

## 2018-05-18 DIAGNOSIS — I483 Typical atrial flutter: Secondary | ICD-10-CM | POA: Diagnosis not present

## 2018-05-18 DIAGNOSIS — R7303 Prediabetes: Secondary | ICD-10-CM | POA: Diagnosis not present

## 2018-05-18 DIAGNOSIS — E559 Vitamin D deficiency, unspecified: Secondary | ICD-10-CM | POA: Diagnosis not present

## 2018-05-18 DIAGNOSIS — I10 Essential (primary) hypertension: Secondary | ICD-10-CM | POA: Diagnosis not present

## 2018-05-18 DIAGNOSIS — M5441 Lumbago with sciatica, right side: Secondary | ICD-10-CM | POA: Diagnosis not present

## 2018-05-18 DIAGNOSIS — I255 Ischemic cardiomyopathy: Secondary | ICD-10-CM | POA: Diagnosis not present

## 2018-05-18 DIAGNOSIS — I472 Ventricular tachycardia: Secondary | ICD-10-CM | POA: Diagnosis not present

## 2018-05-18 DIAGNOSIS — Z85828 Personal history of other malignant neoplasm of skin: Secondary | ICD-10-CM | POA: Diagnosis not present

## 2018-05-18 DIAGNOSIS — Z950 Presence of cardiac pacemaker: Secondary | ICD-10-CM | POA: Diagnosis not present

## 2018-05-18 DIAGNOSIS — I442 Atrioventricular block, complete: Secondary | ICD-10-CM | POA: Diagnosis not present

## 2018-05-18 DIAGNOSIS — D692 Other nonthrombocytopenic purpura: Secondary | ICD-10-CM | POA: Diagnosis not present

## 2018-05-18 DIAGNOSIS — Z951 Presence of aortocoronary bypass graft: Secondary | ICD-10-CM | POA: Diagnosis not present

## 2018-05-18 DIAGNOSIS — M5442 Lumbago with sciatica, left side: Secondary | ICD-10-CM | POA: Diagnosis not present

## 2018-05-18 DIAGNOSIS — I252 Old myocardial infarction: Secondary | ICD-10-CM | POA: Diagnosis not present

## 2018-05-18 DIAGNOSIS — I700 Atherosclerosis of aorta: Secondary | ICD-10-CM | POA: Diagnosis not present

## 2018-05-18 DIAGNOSIS — I2510 Atherosclerotic heart disease of native coronary artery without angina pectoris: Secondary | ICD-10-CM | POA: Diagnosis not present

## 2018-05-18 DIAGNOSIS — E782 Mixed hyperlipidemia: Secondary | ICD-10-CM | POA: Diagnosis not present

## 2018-05-23 DIAGNOSIS — I10 Essential (primary) hypertension: Secondary | ICD-10-CM | POA: Diagnosis not present

## 2018-05-23 DIAGNOSIS — M5442 Lumbago with sciatica, left side: Secondary | ICD-10-CM | POA: Diagnosis not present

## 2018-05-23 DIAGNOSIS — I483 Typical atrial flutter: Secondary | ICD-10-CM | POA: Diagnosis not present

## 2018-05-23 DIAGNOSIS — I2510 Atherosclerotic heart disease of native coronary artery without angina pectoris: Secondary | ICD-10-CM | POA: Diagnosis not present

## 2018-05-23 DIAGNOSIS — I442 Atrioventricular block, complete: Secondary | ICD-10-CM | POA: Diagnosis not present

## 2018-05-23 DIAGNOSIS — M5441 Lumbago with sciatica, right side: Secondary | ICD-10-CM | POA: Diagnosis not present

## 2018-05-25 DIAGNOSIS — I10 Essential (primary) hypertension: Secondary | ICD-10-CM | POA: Diagnosis not present

## 2018-05-25 DIAGNOSIS — M5442 Lumbago with sciatica, left side: Secondary | ICD-10-CM | POA: Diagnosis not present

## 2018-05-25 DIAGNOSIS — I483 Typical atrial flutter: Secondary | ICD-10-CM | POA: Diagnosis not present

## 2018-05-25 DIAGNOSIS — I2510 Atherosclerotic heart disease of native coronary artery without angina pectoris: Secondary | ICD-10-CM | POA: Diagnosis not present

## 2018-05-25 DIAGNOSIS — I442 Atrioventricular block, complete: Secondary | ICD-10-CM | POA: Diagnosis not present

## 2018-05-25 DIAGNOSIS — M5441 Lumbago with sciatica, right side: Secondary | ICD-10-CM | POA: Diagnosis not present

## 2018-05-30 DIAGNOSIS — I2510 Atherosclerotic heart disease of native coronary artery without angina pectoris: Secondary | ICD-10-CM | POA: Diagnosis not present

## 2018-05-30 DIAGNOSIS — I442 Atrioventricular block, complete: Secondary | ICD-10-CM | POA: Diagnosis not present

## 2018-05-30 DIAGNOSIS — M5442 Lumbago with sciatica, left side: Secondary | ICD-10-CM | POA: Diagnosis not present

## 2018-05-30 DIAGNOSIS — M5441 Lumbago with sciatica, right side: Secondary | ICD-10-CM | POA: Diagnosis not present

## 2018-05-30 DIAGNOSIS — I10 Essential (primary) hypertension: Secondary | ICD-10-CM | POA: Diagnosis not present

## 2018-05-30 DIAGNOSIS — I483 Typical atrial flutter: Secondary | ICD-10-CM | POA: Diagnosis not present

## 2018-06-01 ENCOUNTER — Other Ambulatory Visit: Payer: Self-pay | Admitting: Adult Health

## 2018-06-01 DIAGNOSIS — I10 Essential (primary) hypertension: Secondary | ICD-10-CM

## 2018-06-01 DIAGNOSIS — M5442 Lumbago with sciatica, left side: Secondary | ICD-10-CM | POA: Diagnosis not present

## 2018-06-01 DIAGNOSIS — I483 Typical atrial flutter: Secondary | ICD-10-CM | POA: Diagnosis not present

## 2018-06-01 DIAGNOSIS — I442 Atrioventricular block, complete: Secondary | ICD-10-CM | POA: Diagnosis not present

## 2018-06-01 DIAGNOSIS — M5441 Lumbago with sciatica, right side: Secondary | ICD-10-CM | POA: Diagnosis not present

## 2018-06-01 DIAGNOSIS — I2510 Atherosclerotic heart disease of native coronary artery without angina pectoris: Secondary | ICD-10-CM | POA: Diagnosis not present

## 2018-06-06 DIAGNOSIS — I483 Typical atrial flutter: Secondary | ICD-10-CM | POA: Diagnosis not present

## 2018-06-06 DIAGNOSIS — I2510 Atherosclerotic heart disease of native coronary artery without angina pectoris: Secondary | ICD-10-CM | POA: Diagnosis not present

## 2018-06-06 DIAGNOSIS — M5441 Lumbago with sciatica, right side: Secondary | ICD-10-CM | POA: Diagnosis not present

## 2018-06-06 DIAGNOSIS — I442 Atrioventricular block, complete: Secondary | ICD-10-CM | POA: Diagnosis not present

## 2018-06-06 DIAGNOSIS — I10 Essential (primary) hypertension: Secondary | ICD-10-CM | POA: Diagnosis not present

## 2018-06-06 DIAGNOSIS — M5442 Lumbago with sciatica, left side: Secondary | ICD-10-CM | POA: Diagnosis not present

## 2018-06-08 DIAGNOSIS — I442 Atrioventricular block, complete: Secondary | ICD-10-CM | POA: Diagnosis not present

## 2018-06-08 DIAGNOSIS — M5442 Lumbago with sciatica, left side: Secondary | ICD-10-CM | POA: Diagnosis not present

## 2018-06-08 DIAGNOSIS — I10 Essential (primary) hypertension: Secondary | ICD-10-CM | POA: Diagnosis not present

## 2018-06-08 DIAGNOSIS — I2510 Atherosclerotic heart disease of native coronary artery without angina pectoris: Secondary | ICD-10-CM | POA: Diagnosis not present

## 2018-06-08 DIAGNOSIS — M5441 Lumbago with sciatica, right side: Secondary | ICD-10-CM | POA: Diagnosis not present

## 2018-06-08 DIAGNOSIS — I483 Typical atrial flutter: Secondary | ICD-10-CM | POA: Diagnosis not present

## 2018-06-09 ENCOUNTER — Encounter (INDEPENDENT_AMBULATORY_CARE_PROVIDER_SITE_OTHER): Payer: Self-pay | Admitting: Orthopaedic Surgery

## 2018-06-09 ENCOUNTER — Ambulatory Visit (INDEPENDENT_AMBULATORY_CARE_PROVIDER_SITE_OTHER): Payer: Medicare Other | Admitting: Orthopaedic Surgery

## 2018-06-09 ENCOUNTER — Ambulatory Visit (INDEPENDENT_AMBULATORY_CARE_PROVIDER_SITE_OTHER): Payer: Medicare Other

## 2018-06-09 VITALS — BP 135/70 | HR 60 | Ht 66.0 in | Wt 166.0 lb

## 2018-06-09 DIAGNOSIS — I25118 Atherosclerotic heart disease of native coronary artery with other forms of angina pectoris: Secondary | ICD-10-CM | POA: Diagnosis not present

## 2018-06-09 DIAGNOSIS — M545 Low back pain, unspecified: Secondary | ICD-10-CM

## 2018-06-09 NOTE — Progress Notes (Signed)
Office Visit Note   Patient: Paul Frazier.           Date of Birth: 18-Dec-1932           MRN: [id number] Visit Date: 06/09/2018              Requested by: Paul Mutters, PA-C [address] [place], [place] [address] PCP: Paul Pinto, MD   Assessment & Plan: Visit Diagnoses:  1. Lumbar pain     Plan: Chronic low back pain with some discomfort referred both hips and occasional thighs.  Has had a prior MRI scan performed in April 2018 with diffuse degenerative changes lumbar spine and spinal stenosis at several levels.  Presently involved in physical therapy and taking appropriate medicines.  Would suggest an epidural steroid injection.  We will have to stop the Eliquis for several days prior to the exam.  Return to the office in 1 month.  Office visit over 45 minutes 50% of the time in counseling discussing his present problem and reviewing his  prior EMGs and nerve conduction studies and MRI scan  Follow-Up Instructions: Return in about 1 month (around 07/09/2018).   Orders:  Orders Placed This Encounter  Procedures  . XR Lumbar Spine 2-3 Views  . Ambulatory referral to Physical Medicine Rehab   No orders of the defined types were placed in this encounter.     Procedures: No procedures performed   Clinical Data: No additional findings.   Subjective: Chief Complaint  Patient presents with  . Back Pain    lower back pain on left side with bilateral buttocks pain. no radiating pain or numbness. complains of numbness in bilateral feet. symptoms on and off for 2 months. standing long periods of time causes pain and numbness. uses cane as walking aide. gabapentin for pain. fall earlier this year. brought MRI CD from last year.  pacemaker placed august 2019.  Paul Frazier is 84 years old and visited the office for evaluation of low back pain.  He is experiencing some back pain off and on for several years.  The pain is predominant his lumbar spine but  does refer to both buttocks.  He has more trouble on the left than he does on the right.  He will have some discomfort when he is lying down and sometimes when he stands.  The pain does come and go.  He does have permanent numbness in both of his feet.  He is been seen by a neurologist and his primary care physician.  He is presently involved in a course of physical therapy and is taking gabapentin and Tylenol.  All of the above "help he has had EMGs performed through [facility] neurologic that demonstrate right greater than left lumbar radiculopathies particularly at L5-S1.  No evidence of a large fiber sensory neuropathy.  He also had an MRI scan performed in April 2018 that revealed disc bulging and facet arthropathy at L2-3, L3-4 and L4-5 with moderate to severe spinal stenosis.  No stenosis or foraminal narrowing at L5-S1. He did have a pacemaker inserted in August 2019 for rapid heart rate and is presently on Eliquis. He has not had any surgical or other interventional treatment today HPI  Review of Systems   Objective: Vital Signs: BP 135/70 (BP Location: Left Arm, Patient Position: Sitting, Cuff Size: Normal)   Pulse 60   Ht 5\' 6"  (1.676 m)   Wt 166 lb (75.3 kg)   BMI 26.79 kg/m  Physical Exam  Constitutional: He is oriented to person, place, and time. He appears well-developed and well-nourished.  HENT:  Mouth/Throat: Oropharynx is clear and moist.  Eyes: Pupils are equal, round, and reactive to light. EOM are normal.  Pulmonary/Chest: Effort normal.  Neurological: He is alert and oriented to person, place, and time.  Skin: Skin is warm and dry.  Psychiatric: He has a normal mood and affect. His behavior is normal.    Ortho Exam awake alert and oriented x3 comfortable sitting.  Straight leg raise was negative bilaterally.  Painless range of motion of both hips.  Skin is dry minimal pulses both sides but good capillary refill to the feet.  Does have altered sensibility as mentioned  above in both feet.  No percussible tenderness of the lumbar spine.  No pain with range of motion of either knee  Specialty Comments:  No specialty comments available.  Imaging: Xr Lumbar Spine 2-3 Views  Result Date: 06/09/2018 Films of the lumbar spine were obtained in 2 projections.  There is significant degenerative disease throughout the lumbar spine with degenerative disc disease at L1-2, L2-3, L3-4, and L4-5.  There are diffuse osteophytes anteriorly and to some extent posteriorly.  Minimal calcification of the abdominal aorta.  No listhesis.  Degenerative scoliosis to the right.  Mild degenerative changes about the right hip and sacroiliac joints    PMFS History: Patient Active Problem List   Diagnosis Date Noted  . Lumbar pain 06/09/2018  . Senile purpura ([place]) 05/11/2018  . Complete heart block ([place])   . Atrial flutter ([place]) 03/06/2018  . Atherosclerosis of aorta ([place]) 08/23/2016  . Basal cell carcinoma, ear 08/01/2015  . BMI 26.74,  adult 05/01/2015  . Major depression in full remission ([place]) 09/11/2014  . Essential hypertension 01/01/2014  . Vitamin D deficiency 01/01/2014  . Medication management 01/01/2014  . Other abnormal glucose 01/01/2014  . Nonsustained ventricular tachycardia ([place]) 05/23/2013  . CAD (coronary artery disease) 01/08/2013  . Cardiomyopathy, ischemic 01/08/2013  . Hyperlipidemia 01/08/2013  . LBBB (left bundle branch block) 01/08/2013   Past Medical History:  Diagnosis Date  . Arthritis   . CAD (coronary artery disease)   . Hyperlipidemia   . Hypertension   . Old myocardial infarct   . S/P CABG x 5 08/29/1984   LIMA to LAD,SVG to diagonal,SVG to OM,SVG to PDA and PLA  . Tremor, essential     Family History  Problem Relation Age of Onset  . Cancer Father        bone  . Cancer Brother        lymphnode    Past Surgical History:  Procedure Laterality Date  . A-FLUTTER ABLATION N/A 03/09/2018   Procedure: A-FLUTTER ABLATION;  Surgeon:  Evans Lance, MD;  Location: [facility];  Service: Cardiovascular;  Laterality: N/A;  . CHOLECYSTECTOMY  10/1990  . CORONARY ARTERY BYPASS GRAFT  1986   x5 LIMA to LAD,SVG to diagonal,SVG to OM,SVG to PDA and PLA  . CORONARY STENT PLACEMENT    . INGUINAL HERNIA REPAIR  04/25/2012   right- laparoscopic  . PACEMAKER IMPLANT N/A 03/09/2018   Procedure: PACEMAKER IMPLANT;  Surgeon: Evans Lance, MD;  Location: [facility];  Service: Cardiovascular;  Laterality: N/A;  . R/P Myoview  12/27/2008   no ischemia  . TEE WITHOUT CARDIOVERSION N/A 03/09/2018   Procedure: TRANSESOPHAGEAL ECHOCARDIOGRAM (TEE);  Surgeon: Fay Records, MD;  Location: [facility];  Service: Cardiovascular;  Laterality:  N/A;  Bubble Study  . US ECHOCARDIOGRAPHY  12/27/2008   concentric LVH,mild to mod MR, TR and pulmonary hypertension. EF 45-50%   Social History   Occupational History  . Occupation: Retired     Comment: retired  Tobacco Use  . Smoking status: Former Smoker    Last attempt to quit: 07/19/1970    Years since quitting: 47.9  . Smokeless tobacco: Never Used  Substance and Sexual Activity  . Alcohol use: No    Alcohol/week: 0.0 standard drinks  . Drug use: No  . Sexual activity: Not Currently     Garald Balding, MD   Note - This record has been created using [employer].  Chart creation errors have been sought, but may not always  have been located. Such creation errors do not reflect on  the standard of medical care.

## 2018-06-13 DIAGNOSIS — M5442 Lumbago with sciatica, left side: Secondary | ICD-10-CM | POA: Diagnosis not present

## 2018-06-13 DIAGNOSIS — I442 Atrioventricular block, complete: Secondary | ICD-10-CM | POA: Diagnosis not present

## 2018-06-13 DIAGNOSIS — I483 Typical atrial flutter: Secondary | ICD-10-CM | POA: Diagnosis not present

## 2018-06-13 DIAGNOSIS — I10 Essential (primary) hypertension: Secondary | ICD-10-CM | POA: Diagnosis not present

## 2018-06-13 DIAGNOSIS — I2510 Atherosclerotic heart disease of native coronary artery without angina pectoris: Secondary | ICD-10-CM | POA: Diagnosis not present

## 2018-06-13 DIAGNOSIS — M5441 Lumbago with sciatica, right side: Secondary | ICD-10-CM | POA: Diagnosis not present

## 2018-06-14 ENCOUNTER — Ambulatory Visit (INDEPENDENT_AMBULATORY_CARE_PROVIDER_SITE_OTHER): Payer: Medicare Other | Admitting: Cardiovascular Disease

## 2018-06-14 ENCOUNTER — Encounter: Payer: Self-pay | Admitting: Cardiovascular Disease

## 2018-06-14 VITALS — BP 126/68 | HR 53 | Ht 66.5 in | Wt 167.6 lb

## 2018-06-14 DIAGNOSIS — Z950 Presence of cardiac pacemaker: Secondary | ICD-10-CM | POA: Diagnosis not present

## 2018-06-14 DIAGNOSIS — I25718 Atherosclerosis of autologous vein coronary artery bypass graft(s) with other forms of angina pectoris: Secondary | ICD-10-CM

## 2018-06-14 DIAGNOSIS — I441 Atrioventricular block, second degree: Secondary | ICD-10-CM

## 2018-06-14 DIAGNOSIS — I4729 Other ventricular tachycardia: Secondary | ICD-10-CM

## 2018-06-14 DIAGNOSIS — I472 Ventricular tachycardia: Secondary | ICD-10-CM | POA: Diagnosis not present

## 2018-06-14 DIAGNOSIS — I25118 Atherosclerotic heart disease of native coronary artery with other forms of angina pectoris: Secondary | ICD-10-CM | POA: Diagnosis not present

## 2018-06-14 DIAGNOSIS — E782 Mixed hyperlipidemia: Secondary | ICD-10-CM | POA: Diagnosis not present

## 2018-06-14 DIAGNOSIS — I5042 Chronic combined systolic (congestive) and diastolic (congestive) heart failure: Secondary | ICD-10-CM | POA: Diagnosis not present

## 2018-06-14 DIAGNOSIS — I480 Paroxysmal atrial fibrillation: Secondary | ICD-10-CM

## 2018-06-14 MED ORDER — APIXABAN 5 MG PO TABS: 5.0000 mg | ORAL_TABLET | Freq: Two times a day (BID) | ORAL | 3 refills | Status: DC

## 2018-06-14 MED ORDER — ATORVASTATIN CALCIUM 80 MG PO TABS: 40.0000 mg | ORAL_TABLET | Freq: Every day | ORAL | 3 refills | Status: DC

## 2018-06-14 NOTE — Patient Instructions (Signed)
Medication Instructions:  Dr Sallyanne Kuster has recommended making the following medication changes: 1. START Atorvastatin - take 40 mg (0.5 tablet) daily  If you need a refill on your cardiac medications before your next appointment, please call your pharmacy.   Lab work: Your physician recommends that you return for lab work in 3 months - FASTING.  If you have labs (blood work) drawn today and your tests are completely normal, you will receive your results only by: [name] MyChart Message (if you have MyChart) OR . A paper copy in the mail If you have any lab test that is abnormal or we need to change your treatment, we will call you to review the results.  Follow-Up: Dr Sallyanne Kuster recommends that you schedule a follow-up appointment in 3 months.

## 2018-06-14 NOTE — Progress Notes (Signed)
Patient ID: Paul Nap., male   DOB: September 24, 1932, 84 y.o.   MRN: [id number]      Cardiology Office Note   Date:  06/14/2018   ID:  Paul Nap., DOB 07/16/1933, MRN [id number]  PCP:  Unk Pinto, MD  Cardiologist:   Sanda Klein, MD   Chief Complaint  Patient presents with  . Coronary Artery Disease  Pacemaker check, atrial flutter ablation, falls    History of Present Illness: Paul Swingler. is a 84 y.o. male who presents for follow-up on multiple cardiac problems.  His wife Paul Frazier is also my patient, but has been unable to follow-up in a long time.    He has a history of coronary artery disease and previous bypass surgery with subsequent graft failure and myocardial infarction, moderate to severely depressed left ventricular systolic function with EF around 30% and well compensated heart failure, nonsustained ventricular tachycardia, atrial flutter status post radiofrequency ablation, paroxysmal atrial fibrillation, left bundle branch block, second-degree atrioventricular block, pacemaker with septal His bundle ventricular lead (March 09, 2018, Dr. Lovena Le, Medtronic Azure), history of subdural hematoma in 2013.  His activity is limited by unsteady gait and fear of falls.  He has had a few falls, thankfully none of them with head injury.  One did lead to some stitches in his left arm.  He denies angina or dyspnea with activity.  Note that in years past he would only have angina during activity in cold weather.  He has not had edema, orthopnea, PND or palpitations.  Other than the bleeding associated with his arm injury, he has not had any other bleeding complications.  He is compliant with Eliquis but has a hard time paying for the medication.  His wife Paul Frazier also takes Eliquis.  I am not sure what point his atorvastatin was discontinued but it does not appear that this was done due to any side effects.  We will restart it today.  He had atrial flutter ablation,  followed by development of complete heart block.  He underwent implantation of a dual-chamber permanent pacemaker with a His bundle septal ventricular lead on August 22.  Since then his device has recorded 2 episodes of what is probably atrial fibrillation one lasting 44 minutes on September 13, the other lasting for minutes on October 4.  Activity level is steady at about 3.6 hours/day, and a general pattern of improvement.  He has 65% atrial pacing and 98% ventricular pacing, although he is not pacemaker dependent (second-degree AV block with intermittent block at a heart rate of 40-50 bpm).  Ventricular outputs were reduced to chronic settings today.  His estimated generator longevity after these changes is now 8.8 years.  Prior to his RF ablation he underwent a transesophageal echo on August 21 which showed left ventricular ejection fraction of 30-35% with inferior and inferoseptal akinesis and the mildly dilated left atrium, mild mitral regurgitation, moderate tricuspid regurgitation.  A nuclear stress test performed on April 12, 2018 shows a similar pattern of LVEF 30% with inferior scar, incomplete reversible ischemia.  He has a complicated history of coronary disease and underwent bypass surgery in 1986. He has total occlusion of his LAD artery and his dominant left circumflex coronary artery is graft dependent. He presented with a moderate size non-ST segment elevation myocardial infarction in the setting of paroxysmal supraventricular tachycardia in October 2012 and was treated in [place], [place]. The catheterization report is a little confusing since it describes grafts with  different locations than those described at the time of his bypass procedure, however it appears that he has lost the sequential bypass to the oblique marginal artery. A more recent nuclear stress test (May 2014) showed that his left ventricular ejection fraction was down to 42% with inferolateral and anterolateral  scar and hypokinesia but without any reversible ischemic abnormalities. I offered cardiac catheterization at that time since I was worried that he had developed disease in the sequential saphenous vein graft to the PDA/PLA, but he preferred conservative management.  A nuclear stress test in September 2019 shows basal-mid inferior, mostly fixed defect, EF 30%.  He underwent radiofrequency ablation for atrial flutter in August 5956, complicated by complete heart block and leading to implantation of a dual-chamber permanent pacemaker with a His bundle lead ([employer]tronic Azure).  He had long-standing left bundle branch block before that.   Past Medical History:  Diagnosis Date  . Arthritis   . CAD (coronary artery disease)   . Hyperlipidemia   . Hypertension   . Old myocardial infarct   . S/P CABG x 5 08/29/1984   LIMA to LAD,SVG to diagonal,SVG to OM,SVG to PDA and PLA  . Tremor, essential     Past Surgical History:  Procedure Laterality Date  . A-FLUTTER ABLATION N/A 03/09/2018   Procedure: A-FLUTTER ABLATION;  Surgeon: Evans Lance, MD;  Location: [facility];  Service: Cardiovascular;  Laterality: N/A;  . CHOLECYSTECTOMY  10/1990  . CORONARY ARTERY BYPASS GRAFT  1986   x5 LIMA to LAD,SVG to diagonal,SVG to OM,SVG to PDA and PLA  . CORONARY STENT PLACEMENT    . INGUINAL HERNIA REPAIR  04/25/2012   right- laparoscopic  . PACEMAKER IMPLANT N/A 03/09/2018   Procedure: PACEMAKER IMPLANT;  Surgeon: Evans Lance, MD;  Location: [facility];  Service: Cardiovascular;  Laterality: N/A;  . R/P Myoview  12/27/2008   no ischemia  . TEE WITHOUT CARDIOVERSION N/A 03/09/2018   Procedure: TRANSESOPHAGEAL ECHOCARDIOGRAM (TEE);  Surgeon: Fay Records, MD;  [facility];  Service: Cardiovascular;  Laterality: N/A;  Bubble Study  . US ECHOCARDIOGRAPHY  12/27/2008   concentric LVH,mild to mod MR, TR and pulmonary hypertension. EF 45-50%     Current Outpatient Medications    Medication Sig Dispense Refill  . apixaban (ELIQUIS) 5 MG TABS tablet Take 1 tablet (5 mg total) by mouth 2 (two) times daily. 180 tablet 3  . buPROPion (WELLBUTRIN XL) 300 MG 24 hr tablet Take 1 tablet daily for Mood a (Patient taking differently: Take 300 mg by mouth daily. ) 90 tablet 1  . co-enzyme Q-10 30 MG capsule Take 60 mg by mouth daily.    . diazepam (VALIUM) 5 MG tablet Take 1/2 to 1 tablet 2 to 3 x / day only if needed for Muscle spasms  & please try to limit to 5 days /week to avoid addiction 90 tablet 0  . Digestive Enzymes CAPS Take 1-2 capsules by mouth as needed (to aid digestive function).     . gabapentin (NEURONTIN) 100 MG capsule Take 1 capsule (100 mg total) by mouth 3 (three) times daily. 90 capsule 0  . losartan (COZAAR) 25 MG tablet Take 1 tablet (25 mg total) by mouth daily. 30 tablet 11  . Magnesium Chloride (MAGNESIUM DR PO) Take 2 tablets by mouth daily.     . metoprolol succinate (TOPROL-XL) 25 MG 24 hr tablet TAKE 1 TABLET BY MOUTH ONCE DAILY 90 tablet 1  .  nitroGLYCERIN (NITROSTAT) 0.4 MG SL tablet Place 1 tablet (0.4 mg total) under the tongue every 5 (five) minutes as needed for chest pain. 25 tablet 3  . atorvastatin (LIPITOR) 80 MG tablet Take 0.5 tablets (40 mg total) by mouth daily. 45 tablet 3   No current facility-administered medications for this visit.     Allergies:   Erythromycin    Social History:  The patient  reports that he quit smoking about 47 years ago. He has never used smokeless tobacco. He reports that he does not drink alcohol or use drugs.   Family History:  The patient's family history includes Cancer in his brother and father.    ROS:  Please see the history of present illness.    Otherwise, review of systems positive for none.   All other systems are reviewed and negative.    PHYSICAL EXAM: VS:  BP 126/68   Pulse (!) 53   Ht 5' 6.5" (1.689 m)   Wt 167 lb 9.6 oz (76 kg)   SpO2 95%   BMI 26.65 kg/m  , BMI Body mass index  is 26.65 kg/m.   General: Alert, oriented x3, no distress, no distress Head: no evidence of trauma, PERRL, EOMI, no exophtalmos or lid lag, no myxedema, no xanthelasma; normal ears, nose and oropharynx Neck: normal jugular venous pulsations and no hepatojugular reflux; brisk carotid pulses without delay and no carotid bruits Chest: clear to auscultation, no signs of consolidation by percussion or palpation, normal fremitus, symmetrical and full respiratory excursions Cardiovascular: normal position and quality of the apical impulse, regular rhythm, normal first and second heart sounds, no murmurs, rubs or gallops Abdomen: no tenderness or distention, no masses by palpation, no abnormal pulsatility or arterial bruits, normal bowel sounds, no hepatosplenomegaly Extremities: no clubbing, cyanosis or edema; 2+ radial, ulnar and brachial pulses bilaterally; 2+ right femoral, posterior tibial and dorsalis pedis pulses; 2+ left femoral, posterior tibial and dorsalis pedis pulses; no subclavian or femoral bruits Neurological: grossly nonfocal, he walks slowly but his gait appears steady to me. Psych: Normal mood and affect    EKG:  EKG is not ordered today.  ECG from September 5 shows atrial sensed ventricular paced rhythm with positive multiphasic QRS in lead V1 and V2  Recent Labs: 03/06/2018: Magnesium 1.9 05/11/2018: ALT 34; BUN 24; Creat 1.07; Hemoglobin 14.7; Platelets 167; Potassium 4.4; Sodium 138; TSH 0.51    Lipid Panel    Component Value Date/Time   CHOL 185 05/11/2018 1633   TRIG 225 (H) 05/11/2018 1633   HDL 46 05/11/2018 1633   CHOLHDL 4.0 05/11/2018 1633   VLDL 12 03/07/2018 0459   LDLCALC 105 (H) 05/11/2018 1633      Wt Readings from Last 3 Encounters:  06/14/18 167 lb 9.6 oz (76 kg)  06/09/18 166 lb (75.3 kg)  05/11/18 166 lb (75.3 kg)      ASSESSMENT AND PLAN:  1. Chronic combined systolic and diastolic heart failure ([place])   2. Coronary artery disease of  autologous vein bypass graft with stable angina pectoris (H[facility])   3. Paroxysmal atrial fibrillation ([place])   4. NSVT (nonsustained ventricular tachycardia) ([place])   5. Mixed hyperlipidemia   6. Second degree atrioventricular block   7. Pacemaker     1. CHF: Despite markedly reduced left ventricular systolic function he appears to be euvolemic, without the need for loop diuretics.  He is on low doses of ARB and beta-blocker and I am reluctant to increase these further due  to his recurrent falls. 2. CAD s/p CABG: Most recent imaging studies suggest that he may indeed have completely lost vascular supply to the inferior wall due to occlusion of the SVG to the PDA/PLA.  This is probably the reason for the decline in left ventricular systolic function.  He does not have angina at this time.  We will wait and see how things evolve over the next couple of months since usually he develops angina only in cold weather.  Not a lot of room for additional antianginal medications due to his blood pressure, but we could consider Ranexa. 3. AFib: On Eliquis.  No true atrial flutter seen since his ablation in August.  I believe the initial plan was to discontinue anticoagulation 1 month following his flutter ablation.  The burden of atrial fibrillation is low.  He is on Eliquis and we will help him with the patient assistance forms to help with the cost.  Need to keep in mind that he has a history of subdural hematoma after a fall in the past and still appears rather unsteady.  I think the threshold for discontinuation of the anticoagulant is low.  I will discuss this with Dr. Lovena Le. 4. NSVT: None has been detected since pacemaker implantation 5. HLP: Target LDL less than 100, restart his previous dose of atorvastatin and recheck lipid profile in 3 months. 6. 2nd deg AVB: He developed complete heart block following his flutter ablation, but some AV conduction has returned.  Nevertheless, he requires 100% ventricular  pacing.  Thankfully this does not appear to have led to heart failure decompensation, may be because he has a His lead. 7. PPM:  septal His bundle ventricular lead.  Normal device function.  Lead outputs decreased to chronic levels today.  Remote downloads every 3 months and yearly office visits.   Current medicines are reviewed at length with the patient today.  The patient does not have concerns regarding medicines.  The following changes have been made:  no change  Labs/ tests ordered today include:   Orders Placed This Encounter  Procedures  . Lipid panel    Patient Instructions  Medication Instructions:  Dr Sallyanne Kuster has recommended making the following medication changes: 1. START Atorvastatin - take 40 mg (0.5 tablet) daily  If you need a refill on your cardiac medications before your next appointment, please call your pharmacy.   Lab work: Your physician recommends that you return for lab work in 3 months - FASTING.  If you have labs (blood work) drawn today and your tests are completely normal, you will receive your results only by: [name] MyChart Message (if you have MyChart) OR . A paper copy in the mail If you have any lab test that is abnormal or we need to change your treatment, we will call you to review the results.  Follow-Up: Dr Sallyanne Kuster recommends that you schedule a follow-up appointment in 3 months.  Signed, Sanda Klein, MD  06/14/2018 6:57 PM    Sanda Klein, MD, [facility] [phone] office [phone] pager

## 2018-06-20 DIAGNOSIS — I483 Typical atrial flutter: Secondary | ICD-10-CM | POA: Diagnosis not present

## 2018-06-20 DIAGNOSIS — M5442 Lumbago with sciatica, left side: Secondary | ICD-10-CM | POA: Diagnosis not present

## 2018-06-20 DIAGNOSIS — I442 Atrioventricular block, complete: Secondary | ICD-10-CM | POA: Diagnosis not present

## 2018-06-20 DIAGNOSIS — I2510 Atherosclerotic heart disease of native coronary artery without angina pectoris: Secondary | ICD-10-CM | POA: Diagnosis not present

## 2018-06-20 DIAGNOSIS — M5441 Lumbago with sciatica, right side: Secondary | ICD-10-CM | POA: Diagnosis not present

## 2018-06-20 DIAGNOSIS — I10 Essential (primary) hypertension: Secondary | ICD-10-CM | POA: Diagnosis not present

## 2018-06-22 DIAGNOSIS — I442 Atrioventricular block, complete: Secondary | ICD-10-CM | POA: Diagnosis not present

## 2018-06-22 DIAGNOSIS — I2510 Atherosclerotic heart disease of native coronary artery without angina pectoris: Secondary | ICD-10-CM | POA: Diagnosis not present

## 2018-06-22 DIAGNOSIS — M5441 Lumbago with sciatica, right side: Secondary | ICD-10-CM | POA: Diagnosis not present

## 2018-06-22 DIAGNOSIS — I483 Typical atrial flutter: Secondary | ICD-10-CM | POA: Diagnosis not present

## 2018-06-22 DIAGNOSIS — I10 Essential (primary) hypertension: Secondary | ICD-10-CM | POA: Diagnosis not present

## 2018-06-22 DIAGNOSIS — M5442 Lumbago with sciatica, left side: Secondary | ICD-10-CM | POA: Diagnosis not present

## 2018-06-26 ENCOUNTER — Ambulatory Visit (INDEPENDENT_AMBULATORY_CARE_PROVIDER_SITE_OTHER): Payer: Medicare Other | Admitting: Physical Medicine and Rehabilitation

## 2018-06-26 ENCOUNTER — Ambulatory Visit (INDEPENDENT_AMBULATORY_CARE_PROVIDER_SITE_OTHER): Payer: Self-pay

## 2018-06-26 ENCOUNTER — Encounter (INDEPENDENT_AMBULATORY_CARE_PROVIDER_SITE_OTHER): Payer: Self-pay | Admitting: Physical Medicine and Rehabilitation

## 2018-06-26 VITALS — BP 146/77 | HR 58 | Temp 98.0°F

## 2018-06-26 DIAGNOSIS — M48062 Spinal stenosis, lumbar region with neurogenic claudication: Secondary | ICD-10-CM

## 2018-06-26 DIAGNOSIS — I25118 Atherosclerotic heart disease of native coronary artery with other forms of angina pectoris: Secondary | ICD-10-CM

## 2018-06-26 DIAGNOSIS — M5416 Radiculopathy, lumbar region: Secondary | ICD-10-CM

## 2018-06-26 MED ORDER — METHYLPREDNISOLONE ACETATE 80 MG/ML IJ SUSP
80.0000 mg | Freq: Once | INTRAMUSCULAR | Status: AC
  Administered 2018-06-26: 80 mg

## 2018-06-26 NOTE — Progress Notes (Signed)
 .  Numeric Pain Rating Scale and Functional Assessment Average Pain 7   In the last MONTH (on 0-10 scale) has pain interfered with the following?  1. General activity like being  able to carry out your everyday physical activities such as walking, climbing stairs, carrying groceries, or moving a chair?  Rating(4)   +Driver, +BT(eliauis, ok for inj, but pt states he stopped eliquis 06/23/18), -Dye Allergies.

## 2018-06-26 NOTE — Progress Notes (Signed)
Paul Frazier. - 84 y.o. male MRN [id number]  Date of birth: 20-Jul-1932  Office Visit Note: Visit Date: 06/26/2018 PCP: Unk Pinto, MD Referred by: Unk Pinto, MD  Subjective: Chief Complaint  Patient presents with  . Lower Back - Pain  . Right Thigh - Pain  . Left Thigh - Pain   HPI: Paul Frazier. is a 84 y.o. male who comes in today For planned L4-5 interlaminar epidural steroid injection.  Patient has been suffering from chronic worsening severe low back and bilateral radicular pain left more than right.  Followed by Dr. Joni Fears who suggested epidural injection.  Patient was followed by neurology with electrodiagnostic study of L5-S1 radiculopathies bilaterally.  Followed that up with MRI showing severe stenosis at L3-4 and moderate at L4-5.  Depending on relief with interlaminar injection would look at bilateral L3 transforaminal injection versus L5 injection.  ROS Otherwise per HPI.  Assessment & Plan: Visit Diagnoses:  1. Lumbar radiculopathy   2. Spinal stenosis of lumbar region with neurogenic claudication     Plan: No additional findings.   Meds & Orders:  Meds ordered this encounter  Medications  . methylPREDNISolone acetate (DEPO-MEDROL) injection 80 mg    Orders Placed This Encounter  Procedures  . XR C-ARM NO REPORT  . Epidural Steroid injection    Follow-up: Return if symptoms worsen or fail to improve.   Procedures: No procedures performed  Lumbar Epidural Steroid Injection - Interlaminar Approach with Fluoroscopic Guidance  Patient: Paul Frazier.      Date of Birth: 1933/01/21 MRN: [id number] PCP: Unk Pinto, MD      Visit Date: 06/26/2018   Universal Protocol:     Consent Given By: the patient  Position: PRONE  Additional Comments: Vital signs were monitored before and after the procedure. Patient was prepped and draped in the usual sterile fashion. The correct patient, procedure, and site was  verified.   Injection Procedure Details:  Procedure Site One Meds Administered:  Meds ordered this encounter  Medications  . methylPREDNISolone acetate (DEPO-MEDROL) injection 80 mg     Laterality: Left  Location/Site:  L4-L5  Needle size: 20 G  Needle type: Tuohy  Needle Placement: Paramedian epidural  Findings:   -Comments: Excellent flow of contrast into the epidural space.  Procedure Details: Using a paramedian approach from the side mentioned above, the region overlying the inferior lamina was localized under fluoroscopic visualization and the soft tissues overlying this structure were infiltrated with 4 ml. of 1% Lidocaine without Epinephrine. The Tuohy needle was inserted into the epidural space using a paramedian approach.   The epidural space was localized using loss of resistance along with lateral and bi-planar fluoroscopic views.  After negative aspirate for air, blood, and CSF, a 2 ml. volume of Isovue-250 was injected into the epidural space and the flow of contrast was observed. Radiographs were obtained for documentation purposes.    The injectate was administered into the level noted above.   Additional Comments:  The patient tolerated the procedure well Dressing: Band-Aid    Post-procedure details: Patient was observed during the procedure. Post-procedure instructions were reviewed.  Patient left the clinic in stable condition.   Clinical History: STUDY DATE: 10/26/16 PATIENT NAME: Paul Frazier DOB: 1932/09/27 MRN: [id number]  FINDINGS:   On sagittal views the vertebral bodies have normal height and alignment. Sacralization of L5 with transitional features. The conus medullaris terminates at the level of L1.    On axial  views:  L1-2: disc bulging with mild biforaminal stenosis  L2-3: disc bulging and facet hypertrophy with moderate spinal stenosis and moderate biforaminal stenosis  L3-4: disc bulging and facet hypertrophy with severe spinal  stenosis and severe biforaminal stenosis  L4-5: disc bulging and facet hypertrophy with moderate spinal stenosis and severe biforaminal stenosis  L5-S1: no spinal stenosis or foraminal narrowing   Limited views of the aorta, kidneys, iliopsoas muscles and sacroiliac joints are unremarkable.    IMPRESSION:  Abnormal MRI lumbar spine (without) demonstrating: 1. At L3-4: disc bulging and facet hypertrophy with severe spinal stenosis and severe biforaminal stenosis  2. At L4-5: disc bulging and facet hypertrophy with moderate spinal stenosis and severe biforaminal stenosis 3. At L2-3: disc bulging and facet hypertrophy with moderate spinal stenosis and moderate biforaminal stenosis  4. Sacralization of L5 with transitional features. Correlate with plain film xrays if intervention or surgery is planned.     Electrodiagnostic study dated 10/06/2016 IMPRESSION:  Abnormal study demonstrating: - Findings suggest evidence for right greater than left lumbar (L5-S1) radiculopathies. This is due to abnormal bilateral motor nerve responses with normal sensory nerve responses, in the setting of abnormal right lumbar paraspinal and right gastrocnemius needle EMG findings.  - No evidence for large fiber sensory neuropathy.       INTERPRETING PHYSICIAN:  Penni Bombard, MD Certified in Neurology, Neurophysiology and Neuroimaging   He reports that he quit smoking about 48 years ago. He has never used smokeless tobacco.  Recent Labs    09/27/17 1622 02/02/18 1612 05/11/18 1633  HGBA1C 5.5 5.6 5.7*    Objective:  VS:  HT:    WT:   BMI:     BP:(!) 146/77  HR:(!) 58bpm  TEMP:98 F (36.7 C)(Oral)  RESP:  Physical Exam  Ortho Exam Imaging: No results found.  Past Medical/Family/Surgical/Social History: Medications & Allergies reviewed per EMR, new medications updated. Patient Active Problem List   Diagnosis Date Noted  . Lumbar pain 06/09/2018  . Senile purpura ([place])  05/11/2018  . Complete heart block ([place])   . Atrial flutter ([place]) 03/06/2018  . Atherosclerosis of aorta ([place]) 08/23/2016  . Basal cell carcinoma, ear 08/01/2015  . BMI 26.74,  adult 05/01/2015  . Major depression in full remission ([place]) 09/11/2014  . Essential hypertension 01/01/2014  . Vitamin D deficiency 01/01/2014  . Medication management 01/01/2014  . Other abnormal glucose 01/01/2014  . Nonsustained ventricular tachycardia ([place]) 05/23/2013  . CAD (coronary artery disease) 01/08/2013  . Cardiomyopathy, ischemic 01/08/2013  . Hyperlipidemia 01/08/2013  . LBBB (left bundle branch block) 01/08/2013   Past Medical History:  Diagnosis Date  . Arthritis   . CAD (coronary artery disease)   . Hyperlipidemia   . Hypertension   . Old myocardial infarct   . S/P CABG x 5 08/29/1984   LIMA to LAD,SVG to diagonal,SVG to OM,SVG to PDA and PLA  . Tremor, essential    Family History  Problem Relation Age of Onset  . Cancer Father        bone  . Cancer Brother        lymphnode   Past Surgical History:  Procedure Laterality Date  . A-FLUTTER ABLATION N/A 03/09/2018   Procedure: A-FLUTTER ABLATION;  Surgeon: Evans Lance, MD;  Location: [facility];  Service: Cardiovascular;  Laterality: N/A;  . CHOLECYSTECTOMY  10/1990  . CORONARY ARTERY BYPASS GRAFT  1986   x5 LIMA to LAD,SVG to diagonal,SVG to OM,SVG to PDA and  PLA  . CORONARY STENT PLACEMENT    . INGUINAL HERNIA REPAIR  04/25/2012   right- laparoscopic  . PACEMAKER IMPLANT N/A 03/09/2018   Procedure: PACEMAKER IMPLANT;  Surgeon: Evans Lance, MD;  Location: [facility];  Service: Cardiovascular;  Laterality: N/A;  . R/P Myoview  12/27/2008   no ischemia  . TEE WITHOUT CARDIOVERSION N/A 03/09/2018   Procedure: TRANSESOPHAGEAL ECHOCARDIOGRAM (TEE);  Surgeon: Fay Records, MD;  Location: [facility];  Service: Cardiovascular;  Laterality: N/A;  Bubble Study  . US ECHOCARDIOGRAPHY  12/27/2008   concentric LVH,mild  to mod MR, TR and pulmonary hypertension. EF 45-50%   Social History   Occupational History  . Occupation: Retired     Comment: retired  Tobacco Use  . Smoking status: Former Smoker    Last attempt to quit: 07/19/1970    Years since quitting: 48.0  . Smokeless tobacco: Never Used  Substance and Sexual Activity  . Alcohol use: No    Alcohol/week: 0.0 standard drinks  . Drug use: No  . Sexual activity: Not Currently

## 2018-06-26 NOTE — Patient Instructions (Signed)

## 2018-06-27 DIAGNOSIS — I2510 Atherosclerotic heart disease of native coronary artery without angina pectoris: Secondary | ICD-10-CM | POA: Diagnosis not present

## 2018-06-27 DIAGNOSIS — I10 Essential (primary) hypertension: Secondary | ICD-10-CM | POA: Diagnosis not present

## 2018-06-27 DIAGNOSIS — M5441 Lumbago with sciatica, right side: Secondary | ICD-10-CM | POA: Diagnosis not present

## 2018-06-27 DIAGNOSIS — I483 Typical atrial flutter: Secondary | ICD-10-CM | POA: Diagnosis not present

## 2018-06-27 DIAGNOSIS — I442 Atrioventricular block, complete: Secondary | ICD-10-CM | POA: Diagnosis not present

## 2018-06-27 DIAGNOSIS — M5442 Lumbago with sciatica, left side: Secondary | ICD-10-CM | POA: Diagnosis not present

## 2018-07-04 DIAGNOSIS — I442 Atrioventricular block, complete: Secondary | ICD-10-CM | POA: Diagnosis not present

## 2018-07-04 DIAGNOSIS — M5441 Lumbago with sciatica, right side: Secondary | ICD-10-CM | POA: Diagnosis not present

## 2018-07-04 DIAGNOSIS — I10 Essential (primary) hypertension: Secondary | ICD-10-CM | POA: Diagnosis not present

## 2018-07-04 DIAGNOSIS — I2510 Atherosclerotic heart disease of native coronary artery without angina pectoris: Secondary | ICD-10-CM | POA: Diagnosis not present

## 2018-07-04 DIAGNOSIS — I483 Typical atrial flutter: Secondary | ICD-10-CM | POA: Diagnosis not present

## 2018-07-04 DIAGNOSIS — M5442 Lumbago with sciatica, left side: Secondary | ICD-10-CM | POA: Diagnosis not present

## 2018-07-13 DIAGNOSIS — M5442 Lumbago with sciatica, left side: Secondary | ICD-10-CM | POA: Diagnosis not present

## 2018-07-13 DIAGNOSIS — I2510 Atherosclerotic heart disease of native coronary artery without angina pectoris: Secondary | ICD-10-CM | POA: Diagnosis not present

## 2018-07-13 DIAGNOSIS — I483 Typical atrial flutter: Secondary | ICD-10-CM | POA: Diagnosis not present

## 2018-07-13 DIAGNOSIS — M5441 Lumbago with sciatica, right side: Secondary | ICD-10-CM | POA: Diagnosis not present

## 2018-07-13 DIAGNOSIS — I10 Essential (primary) hypertension: Secondary | ICD-10-CM | POA: Diagnosis not present

## 2018-07-13 DIAGNOSIS — I442 Atrioventricular block, complete: Secondary | ICD-10-CM | POA: Diagnosis not present

## 2018-08-04 NOTE — Procedures (Signed)
Lumbar Epidural Steroid Injection - Interlaminar Approach with Fluoroscopic Guidance  Patient: Paul Frazier.      Date of Birth: [birth year] MRN: [id number] PCP: Unk Pinto, MD      Visit Date: 06/26/2018   Universal Protocol:     Consent Given By: the patient  Position: PRONE  Additional Comments: Vital signs were monitored before and after the procedure. Patient was prepped and draped in the usual sterile fashion. The correct patient, procedure, and site was verified.   Injection Procedure Details:  Procedure Site One Meds Administered:  Meds ordered this encounter  Medications  . methylPREDNISolone acetate (DEPO-MEDROL) injection 80 mg     Laterality: Left  Location/Site:  L4-L5  Needle size: 20 G  Needle type: Tuohy  Needle Placement: Paramedian epidural  Findings:   -Comments: Excellent flow of contrast into the epidural space.  Procedure Details: Using a paramedian approach from the side mentioned above, the region overlying the inferior lamina was localized under fluoroscopic visualization and the soft tissues overlying this structure were infiltrated with 4 ml. of 1% Lidocaine without Epinephrine. The Tuohy needle was inserted into the epidural space using a paramedian approach.   The epidural space was localized using loss of resistance along with lateral and bi-planar fluoroscopic views.  After negative aspirate for air, blood, and CSF, a 2 ml. volume of Isovue-250 was injected into the epidural space and the flow of contrast was observed. Radiographs were obtained for documentation purposes.    The injectate was administered into the level noted above.   Additional Comments:  The patient tolerated the procedure well Dressing: Band-Aid    Post-procedure details: Patient was observed during the procedure. Post-procedure instructions were reviewed.  Patient left the clinic in stable condition.

## 2018-08-08 DIAGNOSIS — L821 Other seborrheic keratosis: Secondary | ICD-10-CM | POA: Diagnosis not present

## 2018-08-08 DIAGNOSIS — L570 Actinic keratosis: Secondary | ICD-10-CM | POA: Diagnosis not present

## 2018-08-08 DIAGNOSIS — D1801 Hemangioma of skin and subcutaneous tissue: Secondary | ICD-10-CM | POA: Diagnosis not present

## 2018-08-08 DIAGNOSIS — D225 Melanocytic nevi of trunk: Secondary | ICD-10-CM | POA: Diagnosis not present

## 2018-08-08 DIAGNOSIS — Z85828 Personal history of other malignant neoplasm of skin: Secondary | ICD-10-CM | POA: Diagnosis not present

## 2018-08-14 ENCOUNTER — Encounter: Payer: Self-pay | Admitting: Internal Medicine

## 2018-08-14 ENCOUNTER — Ambulatory Visit (INDEPENDENT_AMBULATORY_CARE_PROVIDER_SITE_OTHER): Payer: Medicare Other | Admitting: Internal Medicine

## 2018-08-14 VITALS — BP 136/60 | HR 67 | Temp 97.7°F | Ht 66.0 in | Wt 163.0 lb

## 2018-08-14 DIAGNOSIS — I10 Essential (primary) hypertension: Secondary | ICD-10-CM | POA: Diagnosis not present

## 2018-08-14 DIAGNOSIS — Z79899 Other long term (current) drug therapy: Secondary | ICD-10-CM

## 2018-08-14 DIAGNOSIS — R7309 Other abnormal glucose: Secondary | ICD-10-CM

## 2018-08-14 DIAGNOSIS — I25118 Atherosclerotic heart disease of native coronary artery with other forms of angina pectoris: Secondary | ICD-10-CM | POA: Diagnosis not present

## 2018-08-14 DIAGNOSIS — R7303 Prediabetes: Secondary | ICD-10-CM | POA: Diagnosis not present

## 2018-08-14 DIAGNOSIS — E782 Mixed hyperlipidemia: Secondary | ICD-10-CM

## 2018-08-14 DIAGNOSIS — E559 Vitamin D deficiency, unspecified: Secondary | ICD-10-CM

## 2018-08-14 LAB — CUP PACEART INCLINIC DEVICE CHECK
Date Time Interrogation Session: 20200127143726
Implantable Lead Implant Date: 20190822
Implantable Lead Implant Date: 20190822
Implantable Lead Location: 753859
Implantable Lead Location: 753860
Implantable Lead Model: 3830
Implantable Lead Model: 5076
Implantable Pulse Generator Implant Date: 20190822

## 2018-08-14 MED ORDER — ATORVASTATIN CALCIUM 40 MG PO TABS: ORAL_TABLET | ORAL | 3 refills | Status: DC

## 2018-08-14 NOTE — Patient Instructions (Signed)

## 2018-08-14 NOTE — Progress Notes (Signed)
This very nice 85 y.o.male presents for 6 month follow up with HTN, HLD, Pre-Diabetes and Vitamin D Deficiency.      Patient is treated for HTN (1980's) & BP has been controlled at home. Today's BP is at goal - 136/60.  Patient is followed by Dr Sallyanne Kuster for hx/o MI,  CABG (1986)  w /graft stenosis later Stenting (2001), chronic HF, VT and atrial arrhythmias including pAfib for which he's on Eliquis and patient also has a PPM for HB.  Patient has had no complaints of any chest pains, palpitations, dyspnea / orthopnea / PND, dizziness, claudication, or dependent edema.     Hyperlipidemia is not controlled with diet & meds. Patient denies myalgias or other med SE's. Last Lipids were not at goal: Lab Results  Component Value Date   CHOL 185 05/11/2018   HDL 46 05/11/2018   LDLCALC 105 (H) 05/11/2018   TRIG 225 (H) 05/11/2018   CHOLHDL 4.0 05/11/2018      Also, the patient has history of PreDiabetes (A1c 5.8% / 2013) and has had no symptoms of reactive hypoglycemia, diabetic polys, paresthesias or visual blurring.  Last A1c was near goal: Lab Results  Component Value Date   HGBA1C 5.7 (H) 05/11/2018      Further, the patient also has history of Vitamin D Deficiency ("40" / 2011)  and supplements vitamin D without any suspected side-effects. Last vitamin D was at goal: Lab Results  Component Value Date   VD25OH 60 02/02/2018   Current Outpatient Medications on File Prior to Visit  Medication Sig  . apixaban (ELIQUIS) 5 MG TABS tablet Take 1 tablet (5 mg total) by mouth 2 (two) times daily.  [name] atorvastatin (LIPITOR) 80 MG tablet Take 0.5 tablets (40 mg total) by mouth daily.  [name] buPROPion (WELLBUTRIN XL) 300 MG 24 hr tablet Take 1 tablet daily for Mood a (Patient taking differently: Take 300 mg by mouth daily. )  . co-enzyme Q-10 30 MG capsule Take 30 mg by mouth daily.   . diazepam (VALIUM) 5 MG tablet Take 1/2 to 1 tablet 2 to 3 x / day only if needed for Muscle spasms  & please try  to limit to 5 days /week to avoid addiction  . Digestive Enzymes CAPS Take 1-2 capsules by mouth as needed (to aid digestive function).   . gabapentin (NEURONTIN) 100 MG capsule Take 1 capsule (100 mg total) by mouth 3 (three) times daily.  [name] losartan (COZAAR) 25 MG tablet Take 1 tablet (25 mg total) by mouth daily.  . Magnesium Chloride (MAGNESIUM DR PO) Take 2 tablets by mouth daily.   . metoprolol succinate (TOPROL-XL) 25 MG 24 hr tablet TAKE 1 TABLET BY MOUTH ONCE DAILY  . nitroGLYCERIN (NITROSTAT) 0.4 MG SL tablet Place 1 tablet (0.4 mg total) under the tongue every 5 (five) minutes as needed for chest pain.   No current facility-administered medications on file prior to visit.    Allergies  Allergen Reactions  . Erythromycin Nausea Only   PMHx:   Past Medical History:  Diagnosis Date  . Arthritis   . CAD (coronary artery disease)   . Hyperlipidemia   . Hypertension   . Old myocardial infarct   . S/P CABG x 5 08/29/1984   LIMA to LAD,SVG to diagonal,SVG to OM,SVG to PDA and PLA  . Tremor, essential    Immunization History  Administered Date(s) Administered  . Influenza, High Dose Seasonal PF 05/14/2016, 06/08/2017, 05/11/2018  .  Pneumococcal Conjugate-13 05/11/2018  . Pneumococcal Polysaccharide-23 10/16/2014  . Td 07/20/2011   Past Surgical History:  Procedure Laterality Date  . A-FLUTTER ABLATION N/A 03/09/2018   Procedure: A-FLUTTER ABLATION;  Surgeon: Evans Lance, MD;  Location: [facility];  Service: Cardiovascular;  Laterality: N/A;  . CHOLECYSTECTOMY  10/1990  . CORONARY ARTERY BYPASS GRAFT  1986   x5 LIMA to LAD,SVG to diagonal,SVG to OM,SVG to PDA and PLA  . CORONARY STENT PLACEMENT    . INGUINAL HERNIA REPAIR  04/25/2012   right- laparoscopic  . PACEMAKER IMPLANT N/A 03/09/2018   Procedure: PACEMAKER IMPLANT;  Surgeon: Evans Lance, MD;  Location: [facility];  Service: Cardiovascular;  Laterality: N/A;  . R/P Myoview  12/27/2008   no  ischemia  . TEE WITHOUT CARDIOVERSION N/A 03/09/2018   Procedure: TRANSESOPHAGEAL ECHOCARDIOGRAM (TEE);  Surgeon: Fay Records, MD;  Location: [facility];  Service: Cardiovascular;  Laterality: N/A;  Bubble Study  . US ECHOCARDIOGRAPHY  12/27/2008   concentric LVH,mild to mod MR, TR and pulmonary hypertension. EF 45-50%   FHx:    Reviewed / unchanged  SHx:    Reviewed / unchanged   Systems Review:  Constitutional: Denies fever, chills, wt changes, headaches, insomnia, fatigue, night sweats, change in appetite. Eyes: Denies redness, blurred vision, diplopia, discharge, itchy, watery eyes.  ENT: Denies discharge, congestion, post nasal drip, epistaxis, sore throat, earache, hearing loss, dental pain, tinnitus, vertigo, sinus pain, snoring.  CV: Denies chest pain, palpitations, irregular heartbeat, syncope, dyspnea, diaphoresis, orthopnea, PND, claudication or edema. Respiratory: denies cough, dyspnea, DOE, pleurisy, hoarseness, laryngitis, wheezing.  Gastrointestinal: Denies dysphagia, odynophagia, heartburn, reflux, water brash, abdominal pain or cramps, nausea, vomiting, bloating, diarrhea, constipation, hematemesis, melena, hematochezia  or hemorrhoids. Genitourinary: Denies dysuria, frequency, urgency, nocturia, hesitancy, discharge, hematuria or flank pain. Musculoskeletal: Denies arthralgias, myalgias, stiffness, jt. swelling, pain, limping or strain/sprain.  Skin: Denies pruritus, rash, hives, warts, acne, eczema or change in skin lesion(s). Neuro: No weakness, tremor, incoordination, spasms, paresthesia or pain. Psychiatric: Denies confusion, memory loss or sensory loss. Endo: Denies change in weight, skin or hair change.  Heme/Lymph: No excessive bleeding, bruising or enlarged lymph nodes.  Physical Exam  BP 136/60   Pulse 67   Temp 97.7 F (36.5 C)   Ht 5\' 6"  (1.676 m)   Wt 163 lb (73.9 kg)   SpO2 99%   BMI 26.31 kg/m   Appears  well nourished, well groomed  and in no  distress.  Eyes: PERRLA, EOMs, conjunctiva no swelling or erythema. Sinuses: No frontal/maxillary tenderness ENT/Mouth: EAC's clear, TM's nl w/o erythema, bulging. Nares clear w/o erythema, swelling, exudates. Oropharynx clear without erythema or exudates. Oral hygiene is good. Tongue normal, non obstructing. Hearing intact.  Neck: Supple. Thyroid not palpable. Car 2+/2+ without bruits, nodes or JVD. Chest: Respirations nl with BS clear & equal w/o rales, rhonchi, wheezing or stridor.  Cor: Heart sounds normal w/ regular rate and rhythm without sig. murmurs, gallops, clicks or rubs. Peripheral pulses normal and equal  without edema.  Abdomen: Soft & bowel sounds normal. Non-tender w/o guarding, rebound, hernias, masses or organomegaly.  Lymphatics: Unremarkable.  Musculoskeletal: Full ROM all peripheral extremities, joint stability, 5/5 strength and normal gait.  Skin: Warm, dry without exposed rashes, lesions or ecchymosis apparent.  Neuro: Cranial nerves intact, reflexes equal bilaterally. Sensory-motor testing grossly intact. Tendon reflexes grossly intact.  Pysch: Alert & oriented x 3.  Insight and judgement nl & appropriate. No ideations.  Assessment and Plan:  1. Essential hypertension  - Continue medication, monitor blood pressure at home.  - Continue DASH diet.  Reminder to go to the ER if any CP,  SOB, nausea, dizziness, severe HA, changes vision/speech.  - CBC with Differential/Platelet - COMPLETE METABOLIC PANEL WITH GFR - Magnesium - TSH  2. Hyperlipidemia, mixed  - Continue diet/meds, exercise,& lifestyle modifications.  - Continue monitor periodic cholesterol/liver & renal functions   - Lipid panel - TSH  3. Abnormal glucose  - Hemoglobin A1c - Insulin, random  4. Vitamin D deficiency  - Continue diet, exercise  - Lifestyle modifications.  - Monitor appropriate labs. - Continue supplementation.  - VITAMIN D 25 Hydroxyl  5. Prediabetes  - Hemoglobin  A1c - Insulin, random  6. Coronary artery disease involving native coronary artery of native heart with other form of angina pectoris ([place])  - Lipid panel  7. Medication management  - CBC with Differential/Platelet - COMPLETE METABOLIC PANEL WITH GFR - Magnesium - Lipid panel - TSH - Hemoglobin A1c - Insulin, random - VITAMIN D 25 Hydroxyl       Discussed  regular exercise, BP monitoring, weight control to achieve/maintain BMI less than 25 and discussed med and SE's. Recommended labs to assess and monitor clinical status with further disposition pending results of labs. Over 30 minutes of exam, counseling, chart review was performed.

## 2018-08-15 LAB — CBC WITH DIFFERENTIAL/PLATELET
Absolute Monocytes: 957 cells/uL — ABNORMAL HIGH (ref 200–950)
Basophils Absolute: 52 cells/uL (ref 0–200)
Basophils Relative: 0.6 %
Eosinophils Absolute: 113 cells/uL (ref 15–500)
Eosinophils Relative: 1.3 %
HCT: 43.6 % (ref 38.5–50.0)
Hemoglobin: 15.1 g/dL (ref 13.2–17.1)
Lymphs Abs: 2828 cells/uL (ref 850–3900)
MCH: 32.1 pg (ref 27.0–33.0)
MCHC: 34.6 g/dL (ref 32.0–36.0)
MCV: 92.6 fL (ref 80.0–100.0)
MPV: 10.4 fL (ref 7.5–12.5)
Monocytes Relative: 11 %
Neutro Abs: 4750 cells/uL (ref 1500–7800)
Neutrophils Relative %: 54.6 %
Platelets: 183 10*3/uL (ref 140–400)
RBC: 4.71 10*6/uL (ref 4.20–5.80)
RDW: 14 % (ref 11.0–15.0)
Total Lymphocyte: 32.5 %
WBC: 8.7 10*3/uL (ref 3.8–10.8)

## 2018-08-15 LAB — TSH: TSH: 0.4 mIU/L (ref 0.40–4.50)

## 2018-08-15 LAB — LIPID PANEL
Cholesterol: 139 mg/dL (ref <200)
HDL: 50 mg/dL (ref >40)
LDL Cholesterol (Calc): 66 mg/dL (calc)
Non-HDL Cholesterol (Calc): 89 mg/dL (calc) (ref <130)
Total CHOL/HDL Ratio: 2.8 (calc) (ref <5.0)
Triglycerides: 144 mg/dL (ref <150)

## 2018-08-15 LAB — COMPLETE METABOLIC PANEL WITH GFR
AG Ratio: 1.6 (calc) (ref 1.0–2.5)
ALT: 24 U/L (ref 9–46)
AST: 23 U/L (ref 10–35)
Albumin: 4.1 g/dL (ref 3.6–5.1)
Alkaline phosphatase (APISO): 55 U/L (ref 40–115)
BUN: 20 mg/dL (ref 7–25)
CO2: 30 mmol/L (ref 20–32)
Calcium: 9.5 mg/dL (ref 8.6–10.3)
Chloride: 105 mmol/L (ref 98–110)
Creat: 1.06 mg/dL (ref 0.70–1.11)
GFR, Est African American: 74 mL/min/{1.73_m2} (ref >=60)
GFR, Est Non African American: 64 mL/min/{1.73_m2} (ref >=60)
Globulin: 2.6 g/dL (calc) (ref 1.9–3.7)
Glucose, Bld: 103 mg/dL — ABNORMAL HIGH (ref 65–99)
Potassium: 4.8 mmol/L (ref 3.5–5.3)
Sodium: 142 mmol/L (ref 135–146)
Total Bilirubin: 0.5 mg/dL (ref 0.2–1.2)
Total Protein: 6.7 g/dL (ref 6.1–8.1)

## 2018-08-15 LAB — INSULIN, RANDOM: Insulin: 11.7 u[IU]/mL (ref 2.0–19.6)

## 2018-08-15 LAB — MAGNESIUM: Magnesium: 1.9 mg/dL (ref 1.5–2.5)

## 2018-08-15 LAB — HEMOGLOBIN A1C
Hgb A1c MFr Bld: 5.8 % of total Hgb — ABNORMAL HIGH (ref <5.7)
Mean Plasma Glucose: 120 (calc)
eAG (mmol/L): 6.6 (calc)

## 2018-08-15 LAB — VITAMIN D 25 HYDROXY (VIT D DEFICIENCY, FRACTURES): Vit D, 25-Hydroxy: 65 ng/mL (ref 30–100)

## 2018-08-21 DIAGNOSIS — E785 Hyperlipidemia, unspecified: Secondary | ICD-10-CM | POA: Diagnosis not present

## 2018-08-21 LAB — LIPID PANEL
Chol/HDL Ratio: 2.2 ratio (ref 0.0–5.0)
Cholesterol, Total: 117 mg/dL (ref 100–199)
HDL: 54 mg/dL (ref >39)
LDL Calculated: 49 mg/dL (ref 0–99)
Triglycerides: 68 mg/dL (ref 0–149)
VLDL Cholesterol Cal: 14 mg/dL (ref 5–40)

## 2018-08-28 ENCOUNTER — Other Ambulatory Visit: Payer: Self-pay

## 2018-08-28 ENCOUNTER — Ambulatory Visit (INDEPENDENT_AMBULATORY_CARE_PROVIDER_SITE_OTHER): Payer: Medicare Other | Admitting: Cardiovascular Disease

## 2018-08-28 ENCOUNTER — Encounter: Payer: Self-pay | Admitting: Cardiovascular Disease

## 2018-08-28 VITALS — BP 144/60 | HR 62 | Ht 66.0 in | Wt 162.0 lb

## 2018-08-28 DIAGNOSIS — G459 Transient cerebral ischemic attack, unspecified: Secondary | ICD-10-CM | POA: Diagnosis not present

## 2018-08-28 DIAGNOSIS — I5042 Chronic combined systolic (congestive) and diastolic (congestive) heart failure: Secondary | ICD-10-CM | POA: Diagnosis not present

## 2018-08-28 DIAGNOSIS — I25118 Atherosclerotic heart disease of native coronary artery with other forms of angina pectoris: Secondary | ICD-10-CM | POA: Diagnosis not present

## 2018-08-28 DIAGNOSIS — I4892 Unspecified atrial flutter: Secondary | ICD-10-CM | POA: Diagnosis not present

## 2018-08-28 DIAGNOSIS — Z950 Presence of cardiac pacemaker: Secondary | ICD-10-CM

## 2018-08-28 DIAGNOSIS — E7800 Pure hypercholesterolemia, unspecified: Secondary | ICD-10-CM | POA: Diagnosis not present

## 2018-08-28 DIAGNOSIS — I442 Atrioventricular block, complete: Secondary | ICD-10-CM | POA: Diagnosis not present

## 2018-08-28 DIAGNOSIS — I472 Ventricular tachycardia: Secondary | ICD-10-CM | POA: Diagnosis not present

## 2018-08-28 DIAGNOSIS — I25718 Atherosclerosis of autologous vein coronary artery bypass graft(s) with other forms of angina pectoris: Secondary | ICD-10-CM | POA: Diagnosis not present

## 2018-08-28 DIAGNOSIS — I4729 Other ventricular tachycardia: Secondary | ICD-10-CM

## 2018-08-28 NOTE — Progress Notes (Signed)
Patient ID: Paul Nap., male   DOB: 1932/10/14, 85 y.o.   MRN: [id number]      Cardiology Office Note   Date:  08/29/2018   ID:  Paul Nap., DOB [birth year], MRN [id number]  PCP:  Unk Pinto, MD  Cardiologist:   Sanda Klein, MD   Chief Complaint  Patient presents with  . Coronary Artery Disease  Pacemaker check, atrial flutter ablation, falls    History of Present Illness: Paul Frazier. is a 85 y.o. male who presents for follow-up on multiple cardiac problems.  His wife Paul Frazier is also my patient, but has been unable to follow-up in a long time.    He has a history of coronary artery disease and previous bypass surgery with subsequent graft failure and myocardial infarction, moderate to severely depressed left ventricular systolic function with EF around 30% and well compensated heart failure, nonsustained ventricular tachycardia, atrial flutter status post radiofrequency ablation, paroxysmal atrial fibrillation, left bundle branch block, second-degree atrioventricular block, pacemaker with septal His bundle ventricular lead (March 09, 2018, Dr. Lovena Le, [employer] Azure), history of subdural hematoma in 2013.  Today his complaints are dominated by an episode that occurred last Friday, February 7, possibly a transient ischemic attack.  He was watching television when he dropped a remote control out of his left hand.  He leaned over tried to grasp it unsuccessfully.  When his wife asked him what was going on he was initially unable to speak and then subsequently began seeking words that made no sense.  The whole event lasted only for couple of minutes and he has had no recurrent neurological issues since then.  He denies any obvious focal weakness, other than the inability to grasp the remote control.  He is on full anticoagulation with Eliquis and has been 100% compliant with the medication by his report.  His pacemaker interrogation does show a low burden of  paroxysmal atrial fibrillation.  The most recent episode lasted for only 16 minutes and occurred on February 3.  The cycle length in the atrium was around 300 ms and the arrhythmia looked fairly irregular, consistent with atrial flutter.  The overall burden of atrial tachyarrhythmia is only 0.6%.  He has 64% atrial pacing and 98.5 ventricular pacing.  He has complete heart block today, but does appear to have an idioventricular escape rhythm in the 30s with detectable R waves.  His heart rate histogram is very blunted.  The ventricular rate rarely exceeds 70 bpm.  He describes exertional fatigue but this time around has not had any issues with exertional angina.  He remains relatively sedentary, but takes care of virtually all the housework by himself.  He is worried by his unsteady gait and goes out infrequently.  He denies problems with edema, PND, orthopnea, palpitations or syncope.  In the past he had a pattern of exertional angina only during cold weather.  This has not been an issue this winter, may be since the weather has been mild.  His activity is limited by unsteady gait and fear of falls.  He has had a few falls, thankfully none of them with head injury.  One did lead to some stitches in his left arm.  He denies angina or dyspnea with activity.  Note that in years past he would only have angina during activity in cold weather.  He has not had edema, orthopnea, PND or palpitations.  He had atrial flutter ablation, followed by development of complete heart  block.  He underwent implantation of a dual-chamber permanent pacemaker with a His bundle septal ventricular lead on March 09, 2018.   Ventricular outputs were reduced to chronic settings today.  His estimated generator longevity after these changes is now 8.8 years.  Prior to his RF ablation he underwent a transesophageal echo on August 21 which showed left ventricular ejection fraction of 30-35% with inferior and inferoseptal akinesis and the  mildly dilated left atrium, mild mitral regurgitation, moderate tricuspid regurgitation.  A nuclear stress test performed on April 12, 2018 shows a similar pattern of LVEF 30% with inferior scar, incomplete reversible ischemia.  He has a complicated history of coronary disease and underwent bypass surgery in 1986. He has total occlusion of his LAD artery and his dominant left circumflex coronary artery is graft dependent. He presented with a moderate size non-ST segment elevation myocardial infarction in the setting of paroxysmal supraventricular tachycardia in October 2012 and was treated in [place], [place]. The catheterization report is a little confusing since it describes grafts with different locations than those described at the time of his bypass procedure, however it appears that he has lost the sequential bypass to the oblique marginal artery. A more recent nuclear stress test (May 2014) showed that his left ventricular ejection fraction was down to 42% with inferolateral and anterolateral scar and hypokinesia but without any reversible ischemic abnormalities. I offered cardiac catheterization at that time since I was worried that he had developed disease in the sequential saphenous vein graft to the PDA/PLA, but he preferred conservative management.  A nuclear stress test in September 2019 shows basal-mid inferior, mostly fixed defect, EF 30%.  He underwent radiofrequency ablation for atrial flutter in August 7106, complicated by complete heart block and leading to implantation of a dual-chamber permanent pacemaker with a His bundle lead ([employer] Azure).  He had long-standing left bundle branch block before that.   Past Medical History:  Diagnosis Date  . Arthritis   . CAD (coronary artery disease)   . Hyperlipidemia   . Hypertension   . Old myocardial infarct   . S/P CABG x 5 08/29/1984   LIMA to LAD,SVG to diagonal,SVG to OM,SVG to PDA and PLA  . Tremor, essential      Past Surgical History:  Procedure Laterality Date  . A-FLUTTER ABLATION N/A 03/09/2018   Procedure: A-FLUTTER ABLATION;  Surgeon: Evans Lance, MD;  Location: [facility];  Service: Cardiovascular;  Laterality: N/A;  . CHOLECYSTECTOMY  10/1990  . CORONARY ARTERY BYPASS GRAFT  1986   x5 LIMA to LAD,SVG to diagonal,SVG to OM,SVG to PDA and PLA  . CORONARY STENT PLACEMENT    . INGUINAL HERNIA REPAIR  04/25/2012   right- laparoscopic  . PACEMAKER IMPLANT N/A 03/09/2018   Procedure: PACEMAKER IMPLANT;  Surgeon: Evans Lance, MD;  Location: [facility];  Service: Cardiovascular;  Laterality: N/A;  . R/P Myoview  12/27/2008   no ischemia  . TEE WITHOUT CARDIOVERSION N/A 03/09/2018   Procedure: TRANSESOPHAGEAL ECHOCARDIOGRAM (TEE);  Surgeon: Fay Records, MD;  Location[facility];  Service: Cardiovascular;  Laterality: N/A;  Bubble Study  . US ECHOCARDIOGRAPHY  12/27/2008   concentric LVH,mild to mod MR, TR and pulmonary hypertension. EF 45-50%     Current Outpatient Medications  Medication Sig Dispense Refill  . apixaban (ELIQUIS) 5 MG TABS tablet Take 1 tablet (5 mg total) by mouth 2 (two) times daily. 180 tablet 3  . atorvastatin (LIPITOR) 40 MG  tablet Take 1 tablet daily for Cholesterol 90 tablet 3  . buPROPion (WELLBUTRIN XL) 300 MG 24 hr tablet Take 1 tablet daily for Mood a (Patient taking differently: Take 300 mg by mouth daily. ) 90 tablet 1  . co-enzyme Q-10 30 MG capsule Take 30 mg by mouth daily.     . diazepam (VALIUM) 5 MG tablet Take 1/2 to 1 tablet 2 to 3 x / day only if needed for Muscle spasms  & please try to limit to 5 days /week to avoid addiction 90 tablet 0  . Digestive Enzymes CAPS Take 1-2 capsules by mouth as needed (to aid digestive function).     . gabapentin (NEURONTIN) 100 MG capsule Take 1 capsule (100 mg total) by mouth 3 (three) times daily. 90 capsule 0  . losartan (COZAAR) 25 MG tablet Take 1 tablet (25 mg total) by mouth daily. 30  tablet 11  . Magnesium Chloride (MAGNESIUM DR PO) Take 2 tablets by mouth daily.     . metoprolol succinate (TOPROL-XL) 25 MG 24 hr tablet TAKE 1 TABLET BY MOUTH ONCE DAILY 90 tablet 1  . nitroGLYCERIN (NITROSTAT) 0.4 MG SL tablet Place 1 tablet (0.4 mg total) under the tongue every 5 (five) minutes as needed for chest pain. 25 tablet 3   No current facility-administered medications for this visit.     Allergies:   Erythromycin    Social History:  The patient  reports that he quit smoking about 48 years ago. He has never used smokeless tobacco. He reports that he does not drink alcohol or use drugs.   Family History:  The patient's family history includes Cancer in his brother and father.    ROS:  Please see the history of present illness.    Otherwise, review of systems positive for none.   All other systems are reviewed and negative.    PHYSICAL EXAM: VS:  BP (!) 144/60 (BP Location: Left Arm, Patient Position: Sitting, Cuff Size: Normal)   Pulse 62   Ht 5\' 6"  (1.676 m)   Wt 162 lb (73.5 kg)   SpO2 94%   BMI 26.15 kg/m  , BMI Body mass index is 26.15 kg/m.    General: Alert, oriented x3, no distress, lean Head: no evidence of trauma, PERRL, EOMI, no exophtalmos or lid lag, no myxedema, no xanthelasma; normal ears, nose and oropharynx Neck: normal jugular venous pulsations and no hepatojugular reflux; brisk carotid pulses without delay and no carotid bruits Chest: clear to auscultation, no signs of consolidation by percussion or palpation, normal fremitus, symmetrical and full respiratory excursions Cardiovascular: normal position and quality of the apical impulse, regular rhythm, normal first and paradoxically split second heart sounds, no murmurs, rubs or gallops. Healthy pacemaker site. Abdomen: no tenderness or distention, no masses by palpation, no abnormal pulsatility or arterial bruits, normal bowel sounds, no hepatosplenomegaly Extremities: no clubbing, cyanosis or  edema; 2+ radial, ulnar and brachial pulses bilaterally; 2+ right femoral, posterior tibial and dorsalis pedis pulses; 2+ left femoral, posterior tibial and dorsalis pedis pulses; no subclavian or femoral bruits Neurological: grossly nonfocal Psych: Normal mood and affect   EKG:  EKG isordered today.  ECG shows atrial paced, ventricular paced rhythm, there is a prominent positive R waves in lead V2 consistent with effective His bundle pacing, QRS is however brought out 154 ms (he had pre-existing left bundle branch block), QTC 488 ms.  He has chronic ST-T wave changes in leads V5-V6, 2, 3, aVF.  Recent  Labs: 08/14/2018: ALT 24; BUN 20; Creat 1.06; Hemoglobin 15.1; Magnesium 1.9; Platelets 183; Potassium 4.8; Sodium 142; TSH 0.40    Lipid Panel    Component Value Date/Time   CHOL 117 08/21/2018 0943   TRIG 68 08/21/2018 0943   HDL 54 08/21/2018 0943   CHOLHDL 2.2 08/21/2018 0943   CHOLHDL 2.8 08/14/2018 1535   VLDL 12 03/07/2018 0459   LDLCALC 49 08/21/2018 0943   LDLCALC 66 08/14/2018 1535      Wt Readings from Last 3 Encounters:  08/28/18 162 lb (73.5 kg)  08/14/18 163 lb (73.9 kg)  06/14/18 167 lb 9.6 oz (76 kg)      ASSESSMENT AND PLAN:  1. TIA (transient ischemic attack)   2. Chronic combined systolic and diastolic heart failure ([place])   3. Coronary artery disease of autologous vein bypass graft with stable angina pectoris (HCC)   4. Paroxysmal atrial flutter ([place])   5. Hypercholesterolemia   6. Complete heart block ([place])   7. Pacemaker   8. NSVT (nonsustained ventricular tachycardia) ([place])     1. TIA: Episodes from last Friday are highly concerning for a transient ischemic attack.  He describes pretty classic expressive aphasia, but also had left hand weakness which would be unlikely concordant event from a single neurological location.  We will schedule for carotid Doppler ultrasound.  Previous carotid ultrasound performed 2016 showed minor bilateral plaque but did  show evidence of an occluded right vertebral artery.  Reinforced the importance of compliance with Eliquis, which he reports is 100%.  I think the events were too brief to warrant performance of a CT or MRI of the head at this point.  We will start aspirin 81 mg daily while we wait for the results of the carotid scans. 2. CHF: no clinical signs of hypervolemia, no need for diuretics. On low dose of beta blocker and ARB, unsteady gait/falls preclude dose titration. 3. CAD s/p CABG: Currently CCS class 1-2. Most recent imaging studies suggest that he may indeed have completely lost vascular supply to the inferior wall due to occlusion of the SVG to the PDA/PLA.  He usually develops angina only in cold weather, not this winter.  Not a lot of room for additional antianginal medications due to his blood pressure, but we could consider Ranexa. 4. AFlutter: On Eliquis.  No true atrial flutter seen since his ablation in August.  I believe the initial plan was to discontinue anticoagulation 1 month following his flutter ablation.  The burden of atrial fibrillation is low.  Because of his previous history of fall and subdural hematoma we have been planning to discontinue the Eliquis, but I think with a recent neurological event I would not want to make that decision. 5. HLP: He is back on atorvastatin.  Lipid profile is within desired target range. 6. 2nd/3rd deg AVB: Intermittent complete heart block since his flutter ablation.  He requires virtually 100% ventricular pacing.  Thankfully this does not appear to have led to heart failure decompensation, may be because he has a His lead. 7. PPM:  septal His bundle ventricular lead.  Normal device function. Remote downloads every 3 months and yearly office visits. 8. NSVT: None recorded since implantation of his pacemaker 6 months ago. 9.  Occluded right vertebral artery: Chronic, unlikely to explain his recent neurological complaints.  Current medicines are reviewed  at length with the patient today.  The patient does not have concerns regarding medicines.  The following changes have been made:  Add aspirin 81 mg daily.  Continue Eliquis  Labs/ tests ordered today include:   Orders Placed This Encounter  Procedures  . EKG 12-Lead    Patient Instructions  Medication Instructions:  The current medical regimen is effective;  continue present plan and medications.  If you need a refill on your cardiac medications before your next appointment, please call your pharmacy.    Testing/Procedures: Your physician has requested that you have a carotid duplex. This test is an ultrasound of the carotid arteries in your neck. It looks at blood flow through these arteries that supply the brain with blood. Allow one hour for this exam. There are no restrictions or special instructions.   Follow-Up: At [facility], you and your health needs are our priority.  As part of our continuing mission to provide you with exceptional heart care, we have created designated Provider Care Teams.  These Care Teams include your primary Cardiologist (physician) and Advanced Practice Providers (APPs -  Physician Assistants and Nurse Practitioners) who all work together to provide you with the care you need, when you need it. You will need a follow up appointment in 3 months. You may see Sanda Klein, MD or one of the following Advanced Practice Providers on your designated Care Team: [place], [place] . Fabian Sharp, PA-C      Signed, Sanda Klein, MD  08/29/2018 1:53 PM    Sanda Klein, MD, [facility] [phone] office [phone] pager

## 2018-08-28 NOTE — Patient Instructions (Signed)
Medication Instructions:  The current medical regimen is effective;  continue present plan and medications.  If you need a refill on your cardiac medications before your next appointment, please call your pharmacy.    Testing/Procedures: Your physician has requested that you have a carotid duplex. This test is an ultrasound of the carotid arteries in your neck. It looks at blood flow through these arteries that supply the brain with blood. Allow one hour for this exam. There are no restrictions or special instructions.   Follow-Up: At [facility], you and your health needs are our priority.  As part of our continuing mission to provide you with exceptional heart care, we have created designated Provider Care Teams.  These Care Teams include your primary Cardiologist (physician) and Advanced Practice Providers (APPs -  Physician Assistants and Nurse Practitioners) who all work together to provide you with the care you need, when you need it. You will need a follow up appointment in 3 months. You may see Sanda Klein, MD or one of the following Advanced Practice Providers on your designated Care Team: [place], [place] . Fabian Sharp, PA-C

## 2018-08-29 DIAGNOSIS — E7800 Pure hypercholesterolemia, unspecified: Secondary | ICD-10-CM | POA: Insufficient documentation

## 2018-08-29 DIAGNOSIS — Z8673 Personal history of transient ischemic attack (TIA), and cerebral infarction without residual deficits: Secondary | ICD-10-CM | POA: Insufficient documentation

## 2018-08-29 DIAGNOSIS — Z950 Presence of cardiac pacemaker: Secondary | ICD-10-CM | POA: Insufficient documentation

## 2018-08-29 DIAGNOSIS — G459 Transient cerebral ischemic attack, unspecified: Secondary | ICD-10-CM | POA: Insufficient documentation

## 2018-08-29 DIAGNOSIS — I5042 Chronic combined systolic (congestive) and diastolic (congestive) heart failure: Secondary | ICD-10-CM | POA: Insufficient documentation

## 2018-08-30 ENCOUNTER — Other Ambulatory Visit: Payer: Self-pay | Admitting: Internal Medicine

## 2018-08-30 DIAGNOSIS — M62838 Other muscle spasm: Secondary | ICD-10-CM

## 2018-08-30 MED ORDER — DIAZEPAM 5 MG PO TABS: ORAL_TABLET | ORAL | 0 refills | Status: DC

## 2018-09-05 LAB — CUP PACEART INCLINIC DEVICE CHECK
Date Time Interrogation Session: 20200218151004
Implantable Lead Implant Date: 20190822
Implantable Lead Implant Date: 20190822
Implantable Lead Location: 753859
Implantable Lead Location: 753860
Implantable Lead Model: 3830
Implantable Lead Model: 5076
Implantable Pulse Generator Implant Date: 20190822

## 2018-09-08 ENCOUNTER — Inpatient Hospital Stay (HOSPITAL_COMMUNITY): Admission: RE | Admit: 2018-09-08 | Payer: Medicare Other | Source: Ambulatory Visit

## 2018-09-18 ENCOUNTER — Ambulatory Visit (HOSPITAL_COMMUNITY)
Admission: RE | Admit: 2018-09-18 | Payer: Medicare Other | Source: Ambulatory Visit | Attending: Cardiovascular Disease | Admitting: Cardiovascular Disease

## 2018-09-18 ENCOUNTER — Ambulatory Visit (INDEPENDENT_AMBULATORY_CARE_PROVIDER_SITE_OTHER): Payer: Medicare Other | Admitting: *Deleted

## 2018-09-18 ENCOUNTER — Other Ambulatory Visit: Payer: Self-pay | Admitting: Cardiovascular Disease

## 2018-09-18 DIAGNOSIS — I442 Atrioventricular block, complete: Secondary | ICD-10-CM | POA: Diagnosis not present

## 2018-09-18 DIAGNOSIS — G459 Transient cerebral ischemic attack, unspecified: Secondary | ICD-10-CM

## 2018-09-18 DIAGNOSIS — I4892 Unspecified atrial flutter: Secondary | ICD-10-CM

## 2018-09-18 DIAGNOSIS — I5042 Chronic combined systolic (congestive) and diastolic (congestive) heart failure: Secondary | ICD-10-CM

## 2018-09-18 LAB — CUP PACEART REMOTE DEVICE CHECK
Battery Remaining Longevity: 105 mo
Battery Voltage: 3.04 V
Brady Statistic AP VP Percent: 86.39 %
Brady Statistic AP VS Percent: 0.07 %
Brady Statistic AS VP Percent: 12.66 %
Brady Statistic AS VS Percent: 0.88 %
Brady Statistic RA Percent Paced: 87.16 %
Brady Statistic RV Percent Paced: 99.05 %
Date Time Interrogation Session: 20200302081906
Implantable Lead Implant Date: 20190822
Implantable Lead Implant Date: 20190822
Implantable Lead Location: 753859
Implantable Lead Location: 753860
Implantable Lead Model: 3830
Implantable Lead Model: 5076
Implantable Pulse Generator Implant Date: 20190822
Lead Channel Impedance Value: 361 Ohm
Lead Channel Impedance Value: 399 Ohm
Lead Channel Impedance Value: 456 Ohm
Lead Channel Impedance Value: 494 Ohm
Lead Channel Pacing Threshold Amplitude: 0.625 V
Lead Channel Pacing Threshold Amplitude: 0.75 V
Lead Channel Pacing Threshold Pulse Width: 0.4 ms
Lead Channel Pacing Threshold Pulse Width: 0.4 ms
Lead Channel Sensing Intrinsic Amplitude: 5.5 mV
Lead Channel Sensing Intrinsic Amplitude: 5.5 mV
Lead Channel Sensing Intrinsic Amplitude: 9.375 mV
Lead Channel Sensing Intrinsic Amplitude: 9.75 mV
Lead Channel Setting Pacing Amplitude: 1.5 V
Lead Channel Setting Pacing Amplitude: 2.5 V
Lead Channel Setting Pacing Pulse Width: 1 ms
Lead Channel Setting Sensing Sensitivity: 2.8 mV

## 2018-09-19 ENCOUNTER — Ambulatory Visit (HOSPITAL_COMMUNITY)
Admission: RE | Admit: 2018-09-19 | Discharge: 2018-09-19 | Disposition: A | Discharge status: Home / Self Care | Payer: Medicare Other | Source: Ambulatory Visit | Attending: Cardiology | Admitting: Cardiology

## 2018-09-19 DIAGNOSIS — G459 Transient cerebral ischemic attack, unspecified: Secondary | ICD-10-CM | POA: Diagnosis not present

## 2018-09-20 ENCOUNTER — Other Ambulatory Visit: Payer: Self-pay

## 2018-09-25 ENCOUNTER — Other Ambulatory Visit: Payer: Self-pay | Admitting: Internal Medicine

## 2018-09-25 NOTE — Progress Notes (Signed)
Remote pacemaker transmission.   

## 2018-09-28 ENCOUNTER — Other Ambulatory Visit: Payer: Self-pay

## 2018-09-28 ENCOUNTER — Ambulatory Visit (INDEPENDENT_AMBULATORY_CARE_PROVIDER_SITE_OTHER): Payer: Medicare Other | Admitting: Internal Medicine

## 2018-09-28 VITALS — BP 132/69 | HR 72 | Temp 97.4°F | Resp 16 | Ht 66.0 in | Wt 166.4 lb

## 2018-09-28 DIAGNOSIS — I25118 Atherosclerotic heart disease of native coronary artery with other forms of angina pectoris: Secondary | ICD-10-CM | POA: Diagnosis not present

## 2018-09-28 DIAGNOSIS — I10 Essential (primary) hypertension: Secondary | ICD-10-CM

## 2018-10-01 ENCOUNTER — Encounter: Payer: Self-pay | Admitting: Internal Medicine

## 2018-10-01 NOTE — Patient Instructions (Signed)

## 2018-10-01 NOTE — Progress Notes (Signed)
This very nice 85 y.o. MWM with HTN, ASCAD, HLD, Pre-Diabetes and Vitamin D Deficiency who presents for follow up with primary complaints today of ankle swelling.  Wt Readings from Last 3 Encounters:  09/28/18 166 lb 6.4 oz (75.5 kg)  08/28/18 162 lb (73.5 kg)  08/14/18 163 lb (73.9 kg)       Patient is treated for HTN circa 1980's  & BP has been controlled at home. Today's BP is at goal - 132/69.  In 1986 he had CABG and had Graft stenosis with stenting in 2001 by Dr Sallyanne Kuster. Patient also has hx/o chronic Heart Failure, V Tach, atrial tachyarrhythmias including pAfib. Patient is on Eliquis for the latter. Patient has a PPM for Heart Block & is followed by Dr Sallyanne Kuster.  Patient has had no complaints of any cardiac type chest pain, palpitations, dyspnea / orthopnea / PND, dizziness or claudication.       Hyperlipidemia is controlled with diet & meds. Patient denies myalgias or other med SE's. Last Lipids were  Lab Results  Component Value Date   CHOL 117 08/21/2018   HDL 54 08/21/2018   LDLCALC 49 08/21/2018   TRIG 68 08/21/2018   CHOLHDL 2.2 08/21/2018      Also, the patient has history of PreDiabetes (A1c 5.8% / 2013) and has had no symptoms of reactive hypoglycemia, diabetic polys, paresthesias or visual blurring.  Last A1c was still not at goal: Lab Results  Component Value Date   HGBA1C 5.8 (H) 08/14/2018      Further, the patient also has history of Vitamin D Deficiency ("40" / 2011) and supplements vitamin D without any suspected side-effects. Last vitamin D was at goal: Lab Results  Component Value Date   VD25OH 65 08/14/2018   Current Outpatient Medications on File Prior to Visit  Medication Sig  . acetaminophen (TYLENOL) 325 MG tablet Take 650 mg by mouth every 6 (six) hours as needed.  [name] apixaban (ELIQUIS) 5 MG TABS tablet Take 1 tablet (5 mg total) by mouth 2 (two) times daily.  [name] atorvastatin (LIPITOR) 40 MG tablet Take 1 tablet daily for Cholesterol  .  buPROPion (WELLBUTRIN XL) 300 MG 24 hr tablet Take 1 tablet daily for Mood  . co-enzyme Q-10 30 MG capsule Take 30 mg by mouth daily.   . diazepam (VALIUM) 5 MG tablet Take 1/2 to 1 tablet 2 to 3 x / day only if needed for Muscle spasms  & please try to limit to 5 days /week to avoid addiction  . Digestive Enzymes CAPS Take 1-2 capsules by mouth as needed (to aid digestive function).   . gabapentin (NEURONTIN) 100 MG capsule Take 1 capsule (100 mg total) by mouth 3 (three) times daily.  [name] losartan (COZAAR) 25 MG tablet Take 1 tablet (25 mg total) by mouth daily.  . Magnesium Chloride (MAGNESIUM DR PO) Take 2 tablets by mouth daily.   . metoprolol succinate (TOPROL-XL) 25 MG 24 hr tablet TAKE 1 TABLET BY MOUTH ONCE DAILY  . nitroGLYCERIN (NITROSTAT) 0.4 MG SL tablet Place 1 tablet (0.4 mg total) under the tongue every 5 (five) minutes as needed for chest pain.   No current facility-administered medications on file prior to visit.    Allergies  Allergen Reactions  . Erythromycin Nausea Only   PMHx:   Past Medical History:  Diagnosis Date  . Arthritis   . CAD (coronary artery disease)   . Hyperlipidemia   . Hypertension   .  Old myocardial infarct   . S/P CABG x 5 08/29/1984   LIMA to LAD,SVG to diagonal,SVG to OM,SVG to PDA and PLA  . Tremor, essential    Immunization History  Administered Date(s) Administered  . Influenza, High Dose Seasonal PF 05/14/2016, 06/08/2017, 05/11/2018  . Pneumococcal Conjugate-13 05/11/2018  . Pneumococcal Polysaccharide-23 10/16/2014  . Td 07/20/2011   Past Surgical History:  Procedure Laterality Date  . A-FLUTTER ABLATION N/A 03/09/2018   Procedure: A-FLUTTER ABLATION;  Surgeon: Evans Lance, MD;  Location: [facility];  Service: Cardiovascular;  Laterality: N/A;  . CHOLECYSTECTOMY  10/1990  . CORONARY ARTERY BYPASS GRAFT  1986   x5 LIMA to LAD,SVG to diagonal,SVG to OM,SVG to PDA and PLA  . CORONARY STENT PLACEMENT    . INGUINAL HERNIA  REPAIR  04/25/2012   right- laparoscopic  . PACEMAKER IMPLANT N/A 03/09/2018   Procedure: PACEMAKER IMPLANT;  Surgeon: Evans Lance, MD;  Location: [facility];  Service: Cardiovascular;  Laterality: N/A;  . R/P Myoview  12/27/2008   no ischemia  . TEE WITHOUT CARDIOVERSION N/A 03/09/2018   Procedure: TRANSESOPHAGEAL ECHOCARDIOGRAM (TEE);  Surgeon: Fay Records, MD;  Location[facility];  Service: Cardiovascular;  Laterality: N/A;  Bubble Study  . US ECHOCARDIOGRAPHY  12/27/2008   concentric LVH,mild to mod MR, TR and pulmonary hypertension. EF 45-50%   FHx:    Reviewed / unchanged  SHx:    Reviewed / unchanged   Systems Review:  Constitutional: Denies fever, chills, wt changes, headaches, insomnia, fatigue, night sweats, change in appetite. Eyes: Denies redness, blurred vision, diplopia, discharge, itchy, watery eyes.  ENT: Denies discharge, congestion, post nasal drip, epistaxis, sore throat, earache, hearing loss, dental pain, tinnitus, vertigo, sinus pain, snoring.  CV: Denies chest pain, palpitations, irregular heartbeat, syncope, dyspnea, diaphoresis, orthopnea, PND or claudication. Respiratory: denies cough, dyspnea, DOE, pleurisy, hoarseness, laryngitis, wheezing.  Gastrointestinal: Denies dysphagia, odynophagia, heartburn, reflux, water brash, abdominal pain or cramps, nausea, vomiting, bloating, diarrhea, constipation, hematemesis, melena, hematochezia  or hemorrhoids. Genitourinary: Denies dysuria, frequency, urgency, nocturia, hesitancy, discharge, hematuria or flank pain. Musculoskeletal: Denies arthralgias, myalgias, stiffness, jt. swelling, pain, limping or strain/sprain.  Skin: Denies pruritus, rash, hives, warts, acne, eczema or change in skin lesion(s). Neuro: No weakness, tremor, incoordination, spasms, paresthesia or pain. Psychiatric: Denies confusion, memory loss or sensory loss. Endo: Denies change in weight, skin or hair change.  Heme/Lymph: No excessive  bleeding, bruising or enlarged lymph nodes.  Physical Exam  BP 132/69   Pulse 72   Temp (!) 97.4 F (36.3 C)   Resp 16   Ht 5\' 6"  (1.676 m)   Wt 166 lb 6.4 oz (75.5 kg)   BMI 26.86 kg/m   Appears  well nourished, well groomed  and in no distress.  Eyes: PERRLA, EOMs, conjunctiva no swelling or erythema. Sinuses: No frontal/maxillary tenderness ENT/Mouth: EAC's clear, TM's nl w/o erythema, bulging. Nares clear w/o erythema, swelling, exudates. Oropharynx clear without erythema or exudates. Oral hygiene is good. Tongue normal, non obstructing. Hearing intact.  Neck: Supple. Thyroid not palpable. Car 2+/2+ without bruits, nodes or JVD. Chest: Respirations nl with BS clear & equal w/o rales, rhonchi, wheezing or stridor.  Cor: Heart sounds normal w/ regular rate and rhythm without sig. murmurs, gallops, clicks or rubs. Peripheral pulses normal and equal  without edema.  Abdomen: Soft & bowel sounds normal. Non-tender w/o guarding, rebound, hernias, masses or organomegaly.  Lymphatics: Unremarkable.  Musculoskeletal: Full ROM all peripheral extremities,  joint stability, 5/5 strength and normal gait.  Skin: Warm, dry without exposed rashes, lesions or ecchymosis apparent.  Neuro: Cranial nerves intact, reflexes equal bilaterally. Sensory-motor testing grossly intact. Tendon reflexes grossly intact.  Pysch: Alert & oriented x 3.  Insight and judgement nl & appropriate. No ideations.  Assessment and Plan:  1. Essential hypertension  - Reassured no signioficant edema . - Continue medication, monitor blood pressure at home.  - Continue DASH diet.  Reminder to go to the ER if any CP,  SOB, nausea, dizziness, severe HA, changes vision/speech.    Discussed  regular exercise, BP monitoring and discussed med and SE's.  Over 15 minutes of exam, counseling, chart review was performed.

## 2018-10-09 ENCOUNTER — Other Ambulatory Visit: Payer: Self-pay | Admitting: Internal Medicine

## 2018-10-09 DIAGNOSIS — M5442 Lumbago with sciatica, left side: Principal | ICD-10-CM

## 2018-10-09 DIAGNOSIS — G8929 Other chronic pain: Secondary | ICD-10-CM

## 2018-10-09 DIAGNOSIS — M5441 Lumbago with sciatica, right side: Principal | ICD-10-CM

## 2018-10-10 ENCOUNTER — Ambulatory Visit (INDEPENDENT_AMBULATORY_CARE_PROVIDER_SITE_OTHER): Payer: Medicare Other | Admitting: Internal Medicine

## 2018-10-10 ENCOUNTER — Other Ambulatory Visit: Payer: Self-pay

## 2018-10-10 ENCOUNTER — Encounter: Payer: Self-pay | Admitting: Internal Medicine

## 2018-10-10 VITALS — BP 138/76 | HR 88 | Temp 97.0°F | Resp 16 | Ht 66.0 in | Wt 158.2 lb

## 2018-10-10 DIAGNOSIS — R609 Edema, unspecified: Secondary | ICD-10-CM | POA: Diagnosis not present

## 2018-10-10 DIAGNOSIS — I10 Essential (primary) hypertension: Secondary | ICD-10-CM

## 2018-10-10 DIAGNOSIS — I25118 Atherosclerotic heart disease of native coronary artery with other forms of angina pectoris: Secondary | ICD-10-CM | POA: Diagnosis not present

## 2018-10-10 MED ORDER — HYDROCHLOROTHIAZIDE 25 MG PO TABS: ORAL_TABLET | ORAL | 0 refills | Status: DC

## 2018-10-10 NOTE — Patient Instructions (Signed)

## 2018-10-10 NOTE — Progress Notes (Signed)
  Subjective:    Patient ID: Paul Nap., male    DOB: 06/12/1933, 85 y.o.   MRN: [id number]  HPI     This nice 85 yo MWM with hx/o HTN, ASHD s/p CABG, HLD, PreDM and Vit D Deficiency returns from OV 12 days ago for evaluation of L ankle edema. At that time, there was no significant perceptible edema. Noted weight is down 8# over the last 12 days and he is not on a diuretic. He denies LLE pain other than awareness of "feeling tight" in the LLE. Denies calf pains or claudication sx's.   Medication Sig  . acetaminophen (TYLENOL) 325 MG tablet Take 650 mg by mouth every 6 (six) hours as needed.  [name] apixaban (ELIQUIS) 5 MG TABS tablet Take 1 tablet (5 mg total) by mouth 2 (two) times daily.  [name] atorvastatin (LIPITOR) 40 MG tablet Take 1 tablet daily for Cholesterol  . buPROPion (WELLBUTRIN XL) 300 MG 24 hr tablet Take 1 tablet daily for Mood  . co-enzyme Q-10 30 MG capsule Take 30 mg by mouth daily.   . diazepam (VALIUM) 5 MG tablet Take 1/2 to 1 tablet 2 to 3 x / day only if needed for Muscle spasms  & please try to limit to 5 days /week to avoid addiction  . Digestive Enzymes CAPS Take 1-2 capsules by mouth as needed (to aid digestive function).   . gabapentin (NEURONTIN) 100 MG capsule TAKE 1 CAPSULE BY MOUTH THREE TIMES DAILY FOR PAIN  . losartan (COZAAR) 25 MG tablet Take 1 tablet (25 mg total) by mouth daily.  . Magnesium Chloride (MAGNESIUM DR PO) Take 2 tablets by mouth daily.   . metoprolol succinate (TOPROL-XL) 25 MG 24 hr tablet TAKE 1 TABLET BY MOUTH ONCE DAILY  . nitroGLYCERIN (NITROSTAT) 0.4 MG SL tablet Place 1 tablet (0.4 mg total) under the tongue every 5 (five) minutes as needed for chest pain.   Allergies  Allergen Reactions  . Erythromycin Nausea Only   Past Medical History:  Diagnosis Date  . Arthritis   . CAD (coronary artery disease)   . Hyperlipidemia   . Hypertension   . Old myocardial infarct   . S/P CABG x 5 08/29/1984   LIMA to LAD,SVG to diagonal,SVG to  OM,SVG to PDA and PLA  . Tremor, essential    Wt Readings from Last 3 Encounters:  10/10/18 158 lb 3.2 oz (71.8 kg)  09/28/18 166 lb 6.4 oz (75.5 kg)  08/28/18 162 lb (73.5 kg)   Review of Systems    10 point systems review negative except as above.    Objective:   Physical Exam  BP 138/76   Pulse 88   Temp (!) 97 F (36.1 C)   Resp 16   Ht 5\' 6"  (1.676 m)   Wt 158 lb 3.2 oz (71.8 kg)   BMI 25.53 kg/m   HEENT - WNL. Neck - supple. No JVD Chest - Clear equal BS. Cor - Nl HS. RRR w/o sig MGR. PP 1(+). Trace ankle/pretibial edema bilaterally. Capillary refill is Nl. No calf tenderness. MS- FROM w/o deformities.  Gait Nl. Neuro -  Nl w/o focal abnormalities.    Assessment & Plan:   1. Essential hypertension  2. Edema  - discussed low sodium diet, and avoid his usual diet of canned veggies & soups.  - hydrochlorothiazide 25 MG tablet; Take 1 tablet daily ONLY  If needed for ankle swelling  Dispense: 30 tablet

## 2018-11-13 ENCOUNTER — Other Ambulatory Visit: Payer: Self-pay | Admitting: Internal Medicine

## 2018-11-13 DIAGNOSIS — R609 Edema, unspecified: Secondary | ICD-10-CM

## 2018-11-13 DIAGNOSIS — I10 Essential (primary) hypertension: Secondary | ICD-10-CM

## 2018-11-20 ENCOUNTER — Telehealth: Payer: Self-pay | Admitting: Physician Assistant

## 2018-11-20 NOTE — Telephone Encounter (Signed)
Patient refused to schedule Paul Frazier's followup as he did not know him.

## 2018-11-21 NOTE — Progress Notes (Signed)
MEDICARE ANNUAL WELLNESS VISIT AND FOLLOW UP Assessment:   Weight loss Get CT AB Cut back/stop wellbutrin, add on celexa Add ensure   Depression Cut back on wellbutrin and add on celexa Follow up 1 month  Nonsustained ventricular tachycardia (HCC) Monitor, continue meds  Essential hypertension - continue medications, DASH diet, exercise and monitor at home. Call if greater than 130/80.  -     CBC with Differential/Platelet -     BASIC METABOLIC PANEL WITH GFR -     Hepatic function panel -     TSH  Atherosclerosis of aorta (HCC) Control blood pressure, cholesterol, glucose, increase exercise.   Cardiomyopathy, ischemic Control blood pressure, cholesterol, glucose, increase exercise.   Coronary artery disease involving native coronary artery of native heart with other form of angina pectoris ([place]) Refill NTG Control blood pressure, cholesterol, glucose, increase exercise.   Basal cell carcinoma (BCC) of skin of left ear Follow up DERM  Other abnormal glucose -     Hemoglobin A1c Discussed disease progression and risks Discussed diet/exercise, weight management and risk modification A1C  Vitamin D deficiency Continue supplement  Medication management -     Magnesium  Recurrent major depressive disorder, in full remission ([place]) Depression - continue medications, stress management techniques discussed, increase water, good sleep hygiene discussed, increase exercise, and increase veggies.  Hyperlipidemia -continue medications, check lipids, decrease fatty foods, increase activity.  -     Lipid panel  BMI 26.74,  adult Monitor  LBBB (left bundle branch block) Monitor   Over 30 minutes of exam, counseling, chart review, and critical decision making was performed Future Appointments  Date Time Provider [place]  12/18/2018  9:30 AM [facility] DEVICE REMOTES [facility]  03/12/2019  3:00 PM Unk Pinto, MD [facility] None    Plan:    During the course of the visit the patient was educated and counseled about appropriate screening and preventive services including:    Pneumococcal vaccine   Influenza vaccine  Prevnar 13  Td vaccine  Screening electrocardiogram  Colorectal cancer screening  Diabetes screening  Glaucoma screening  Nutrition counseling    Subjective:  Paul Frazier. is a 85 y.o. male who presents for Medicare Annual Wellness Visit and 3 month follow up for HTN, hyperlipidemia, prediabetes, and vitamin D Def, weight loss.   His blood pressure has been controlled at home, today their BP is BP: 136/76 He does workout. He denies chest pain, shortness of breath, dizziness.   He has history of CABG 1986 and stent in 2001, neg heart cath in 2012. Last saw Dr. Sallyanne Kuster 09/08/2018.   BMI is Body mass index is 24.21 kg/m. he complains of weight loss. He is down 16 lbs from Nov. States he is not eating well and he does not have an appetite. He has been on wellbutrin x years.  Denies night sweats, AB pain, SOB, CP.  He does have some constipation but this comes and goes.  He does have dribbling, urinary frequency but no hematuria.  No trouble swallowinng.   He had CT AB that had some abnormalities that need to be repeated, last cologuard 2019, colonoscopy 2016, Remote history of smoking, CXR 02/2018.   He is depressed, having to take care of his wife. He states that she has severe pain and that it is getting worse. He does 90% of his wife's care, he prepares her food, dresses her, bathes her, does her medications. She has fallen several times and he feels he  has to follow her around. He himself walks with a cane.  Wt Readings from Last 8 Encounters:  11/23/18 150 lb (68 kg)  10/10/18 158 lb 3.2 oz (71.8 kg)  09/28/18 166 lb 6.4 oz (75.5 kg)  08/28/18 162 lb (73.5 kg)  08/14/18 163 lb (73.9 kg)  06/14/18 167 lb 9.6 oz (76 kg)  06/09/18 166 lb (75.3 kg)  05/11/18 166 lb (75.3 kg)   He has  history of imbalance and essential tremor, follows with Dr. Rexene Alberts. Has history of falls, has had 1 this last year without injury per patient.   He is on cholesterol medication and denies myalgias. His cholesterol is at goal. The cholesterol last visit was:   Lab Results  Component Value Date   CHOL 117 08/21/2018   HDL 54 08/21/2018   LDLCALC 49 08/21/2018   TRIG 68 08/21/2018   CHOLHDL 2.2 08/21/2018   He has been working on diet and exercise for prediabetes, and denies foot ulcerations, hyperglycemia, hypoglycemia , increased appetite, nausea, polydipsia, polyuria, visual disturbances, vomiting and weight loss. Last A1C in the office was:  Lab Results  Component Value Date   HGBA1C 5.8 (H) 08/14/2018   Last GFR Lab Results  Component Value Date   GFRNONAA 64 08/14/2018    Patient is on Vitamin D supplement.   Lab Results  Component Value Date   VD25OH 65 08/14/2018     CT renal 05/2015 IMPRESSION: 1. Obstructing 6 mm stone at the left ureterovesicular junction with resultant moderate hydroureteronephrosis. Additional nonobstructing stones in the left kidney. 2. Cystic lesion in the right kidney measures at least 3.5 cm with exophytic component and probable thin calcified septa. Recommend nonemergent renal protocol MRI or CT with and without contrast for characterization. 3. Small hypodense liver lesion, too small to characterize, however could be characterized with above CT or MRI. 4. Well-defined soft tissue density in the left inguinal canal, suspect retraction of the left testicle, however recommend confirmation with physical exam. 5. Nonspecific wall thickening of the urinary bladder. 6. Incidental findings include colonic diverticulosis without diverticulitis, duodenum diverticulum, and atherosclerosis.  Medication Review: Current Outpatient Medications on File Prior to Visit  Medication Sig Dispense Refill  . acetaminophen (TYLENOL) 325 MG tablet Take 650 mg by  mouth every 6 (six) hours as needed.    [name] apixaban (ELIQUIS) 5 MG TABS tablet Take 1 tablet (5 mg total) by mouth 2 (two) times daily. 180 tablet 3  . atorvastatin (LIPITOR) 40 MG tablet Take 1 tablet daily for Cholesterol 90 tablet 3  . buPROPion (WELLBUTRIN XL) 300 MG 24 hr tablet Take 1 tablet daily for Mood 90 tablet 3  . co-enzyme Q-10 30 MG capsule Take 30 mg by mouth daily.     . diazepam (VALIUM) 5 MG tablet Take 1/2 to 1 tablet 2 to 3 x / day only if needed for Muscle spasms  & please try to limit to 5 days /week to avoid addiction 90 tablet 0  . Digestive Enzymes CAPS Take 1-2 capsules by mouth as needed (to aid digestive function).     . gabapentin (NEURONTIN) 100 MG capsule TAKE 1 CAPSULE BY MOUTH THREE TIMES DAILY FOR PAIN 90 capsule 0  . hydrochlorothiazide (HYDRODIURIL) 25 MG tablet TAKE 1 TABLET DAILY ONLY  IF  NEEDED  FOR  ANKLE  SWELLING 90 tablet 1  . losartan (COZAAR) 25 MG tablet Take 1 tablet (25 mg total) by mouth daily. 30 tablet 11  . Magnesium Chloride (  MAGNESIUM DR PO) Take 2 tablets by mouth daily.     . metoprolol succinate (TOPROL-XL) 25 MG 24 hr tablet TAKE 1 TABLET BY MOUTH ONCE DAILY 90 tablet 1  . nitroGLYCERIN (NITROSTAT) 0.4 MG SL tablet Place 1 tablet (0.4 mg total) under the tongue every 5 (five) minutes as needed for chest pain. 25 tablet 3   No current facility-administered medications on file prior to visit.     Current Problems (verified) Patient Active Problem List   Diagnosis Date Noted  . Chronic combined systolic and diastolic heart failure ([place]) 08/29/2018  . Pacemaker 08/29/2018  . TIA (transient ischemic attack) 08/29/2018  . Lumbar pain 06/09/2018  . Senile purpura ([place]) 05/11/2018  . Complete heart block ([place])   . Paroxysmal atrial flutter ([place]) 03/06/2018  . Atherosclerosis of aorta ([place]) 08/23/2016  . Basal cell carcinoma, ear 08/01/2015  . Major depression in full remission ([place]) 09/11/2014  . Essential hypertension 01/01/2014  .  Vitamin D deficiency 01/01/2014  . Medication management 01/01/2014  . Other abnormal glucose 01/01/2014  . Nonsustained ventricular tachycardia ([place]) 05/23/2013  . CAD (coronary artery disease) 01/08/2013  . Cardiomyopathy, ischemic 01/08/2013  . Hyperlipidemia 01/08/2013  . LBBB (left bundle branch block) 01/08/2013    Screening Tests Immunization History  Administered Date(s) Administered  . Influenza, High Dose Seasonal PF 05/14/2016, 06/08/2017, 05/11/2018  . Pneumococcal Conjugate-13 05/11/2018  . Pneumococcal Polysaccharide-23 10/16/2014  . Td 07/20/2011    Preventative care: Last colonoscopy: 2016  cologuard 02/2018 Stress test 03/2018 Cath 2012 US carotid 09/2018 NM thyroid 09/2015 MRI brain 06/2017 MRI lumbar 06/2017  CT renal 05/2015 IMPRESSION: 1. Obstructing 6 mm stone at the left ureterovesicular junction with resultant moderate hydroureteronephrosis. Additional nonobstructing stones in the left kidney. 2. Cystic lesion in the right kidney measures at least 3.5 cm with exophytic component and probable thin calcified septa. Recommend nonemergent renal protocol MRI or CT with and without contrast for characterization. 3. Small hypodense liver lesion, too small to characterize, however could be characterized with above CT or MRI. 4. Well-defined soft tissue density in the left inguinal canal, suspect retraction of the left testicle, however recommend confirmation with physical exam. 5. Nonspecific wall thickening of the urinary bladder. 6. Incidental findings include colonic diverticulosis without diverticulitis, duodenum diverticulum, and atherosclerosis.  Prior vaccinations: TD or Tdap: 2013  Influenza:  2019 Pneumococcal: 2016 Prevnar13: 2019 Shingles: declines  Names of Other Physician/Practitioners you currently use: 1. [facility] here for primary care 2. Has not been recently , eye doctor 3. Dr. Stephanie Coup,  dentist, getting partial done Patient Care Team: Unk Pinto, MD as PCP - General (Internal Medicine) Sanda Klein, MD as PCP - Cardiology (Cardiology) [name] Age, MD as Attending Physician (Neurology) Sanda Klein, MD as Consulting Physician (Cardiology) Raynelle Bring, MD as Consulting Physician (Urology)  Allergies Allergies  Allergen Reactions  . Erythromycin Nausea Only    SURGICAL HISTORY He  has a past surgical history that includes Coronary artery bypass graft (1986); Coronary stent placement; Cholecystectomy (10/1990); Inguinal hernia repair (04/25/2012); US ECHOCARDIOGRAPHY (12/27/2008); R/P Myoview (12/27/2008); TEE without cardioversion (N/A, 03/09/2018); A-FLUTTER ABLATION (N/A, 03/09/2018); and PACEMAKER IMPLANT (N/A, 03/09/2018). FAMILY HISTORY His family history includes Cancer in his brother and father. SOCIAL HISTORY He  reports that he quit smoking about 48 years ago. He has never used smokeless tobacco. He reports that he does not drink alcohol or use drugs.  MEDICARE WELLNESS OBJECTIVES: Physical activity:   Cardiac risk factors:  Depression/mood screen:   Depression screen [facility] 2/9 10/10/2018  Decreased Interest 0  Down, Depressed, Hopeless 0  PHQ - 2 Score 0    ADLs:  In your present state of health, do you have any difficulty performing the following activities: 10/10/2018 10/01/2018  Hearing? N N  Vision? N N  Difficulty concentrating or making decisions? N N  Walking or climbing stairs? N N  Dressing or bathing? N N  Doing errands, shopping? N N  Some recent data might be hidden     Cognitive Testing  Alert? Yes  Normal Appearance?Yes  Oriented to person? Yes  Place? Yes   Time? Yes  Recall of three objects?  Yes  Can perform simple calculations? Yes  Displays appropriate judgment?Yes  Can read the correct time from a watch face?Yes  EOL planning: Does Patient Have a Medical Advance Directive?: Yes Type of Advance Directive: Healthcare  Power of Attorney, Living will Copy of [place] in Chart?: No - copy requested   Objective:   Today's Vitals   11/23/18 1500  BP: 136/76  Pulse: 83  Temp: 97.9 F (36.6 C)  SpO2: 96%  Weight: 150 lb (68 kg)  Height: 5\' 6"  (1.676 m)   Body mass index is 24.21 kg/m.  General appearance: alert, no distress, WD/WN, male HEENT: normocephalic, sclerae anicteric, TMs pearly, nares patent, no discharge or erythema, pharynx normal Oral cavity: MMM, no lesions Neck: supple, no lymphadenopathy, no thyromegaly, no masses Heart: RRR, normal S1, S2, no murmurs Lungs: CTA bilaterally, no wheezes, rhonchi, or rales Abdomen: +bs, soft, non tender, non distended, no masses, no hepatomegaly, no splenomegaly Musculoskeletal: nontender, no swelling, no obvious deformity Extremities: no edema, no cyanosis, no clubbing Pulses: 2+ symmetric, upper and lower extremities, normal cap refill Neurological: alert, oriented x 3, CN2-12 intact, strength normal upper extremities and lower extremities, sensation normal throughout, DTRs 2+ throughout, no cerebellar signs, gait unsteady Psychiatric: normal affect, behavior normal, pleasant   Medicare Attestation I have personally reviewed: The patient's medical and social history Their use of alcohol, tobacco or illicit drugs Their current medications and supplements The patient's functional ability including ADLs,fall risks, home safety risks, cognitive, and hearing and visual impairment Diet and physical activities Evidence for depression or mood disorders  The patient's weight, height, BMI, and visual acuity have been recorded in the chart.  I have made referrals, counseling, and provided education to the patient based on review of the above and I have provided the patient with a written personalized care plan for preventive services.     Vicie Mutters, PA-C   11/23/2018

## 2018-11-23 ENCOUNTER — Ambulatory Visit (INDEPENDENT_AMBULATORY_CARE_PROVIDER_SITE_OTHER): Payer: Medicare Other | Admitting: Physician Assistant

## 2018-11-23 ENCOUNTER — Other Ambulatory Visit: Payer: Self-pay

## 2018-11-23 ENCOUNTER — Encounter: Payer: Self-pay | Admitting: Physician Assistant

## 2018-11-23 VITALS — BP 136/76 | HR 83 | Temp 97.9°F | Ht 66.0 in | Wt 150.0 lb

## 2018-11-23 DIAGNOSIS — I255 Ischemic cardiomyopathy: Secondary | ICD-10-CM

## 2018-11-23 DIAGNOSIS — I4892 Unspecified atrial flutter: Secondary | ICD-10-CM

## 2018-11-23 DIAGNOSIS — D692 Other nonthrombocytopenic purpura: Secondary | ICD-10-CM | POA: Diagnosis not present

## 2018-11-23 DIAGNOSIS — Z950 Presence of cardiac pacemaker: Secondary | ICD-10-CM

## 2018-11-23 DIAGNOSIS — Z0001 Encounter for general adult medical examination with abnormal findings: Secondary | ICD-10-CM

## 2018-11-23 DIAGNOSIS — Z79899 Other long term (current) drug therapy: Secondary | ICD-10-CM | POA: Diagnosis not present

## 2018-11-23 DIAGNOSIS — M545 Low back pain, unspecified: Secondary | ICD-10-CM

## 2018-11-23 DIAGNOSIS — I700 Atherosclerosis of aorta: Secondary | ICD-10-CM

## 2018-11-23 DIAGNOSIS — F3342 Major depressive disorder, recurrent, in full remission: Secondary | ICD-10-CM

## 2018-11-23 DIAGNOSIS — I447 Left bundle-branch block, unspecified: Secondary | ICD-10-CM | POA: Diagnosis not present

## 2018-11-23 DIAGNOSIS — I5042 Chronic combined systolic (congestive) and diastolic (congestive) heart failure: Secondary | ICD-10-CM | POA: Diagnosis not present

## 2018-11-23 DIAGNOSIS — I25118 Atherosclerotic heart disease of native coronary artery with other forms of angina pectoris: Secondary | ICD-10-CM | POA: Diagnosis not present

## 2018-11-23 DIAGNOSIS — I442 Atrioventricular block, complete: Secondary | ICD-10-CM | POA: Diagnosis not present

## 2018-11-23 DIAGNOSIS — I472 Ventricular tachycardia: Secondary | ICD-10-CM

## 2018-11-23 DIAGNOSIS — Z0000 Encounter for general adult medical examination without abnormal findings: Secondary | ICD-10-CM

## 2018-11-23 DIAGNOSIS — E559 Vitamin D deficiency, unspecified: Secondary | ICD-10-CM

## 2018-11-23 DIAGNOSIS — E782 Mixed hyperlipidemia: Secondary | ICD-10-CM

## 2018-11-23 DIAGNOSIS — I10 Essential (primary) hypertension: Secondary | ICD-10-CM

## 2018-11-23 DIAGNOSIS — I4729 Other ventricular tachycardia: Secondary | ICD-10-CM

## 2018-11-23 DIAGNOSIS — R6889 Other general symptoms and signs: Secondary | ICD-10-CM | POA: Diagnosis not present

## 2018-11-23 DIAGNOSIS — R634 Abnormal weight loss: Secondary | ICD-10-CM

## 2018-11-23 DIAGNOSIS — G459 Transient cerebral ischemic attack, unspecified: Secondary | ICD-10-CM

## 2018-11-23 DIAGNOSIS — R7309 Other abnormal glucose: Secondary | ICD-10-CM

## 2018-11-23 DIAGNOSIS — C44219 Basal cell carcinoma of skin of left ear and external auricular canal: Secondary | ICD-10-CM

## 2018-11-23 MED ORDER — BUPROPION HCL ER (XL) 150 MG PO TB24: 150.0000 mg | ORAL_TABLET | ORAL | 2 refills | Status: DC

## 2018-11-23 MED ORDER — CITALOPRAM HYDROBROMIDE 20 MG PO TABS: 20.0000 mg | ORAL_TABLET | Freq: Every day | ORAL | 2 refills | Status: DC

## 2018-11-23 NOTE — Patient Instructions (Addendum)
Look up polymyalgia rheumatica, make sure she has had a SED RATE  Will repeat a CT of your AB to evaluate your kidney and liver  Cut the buproprion in half and start the celexa 10mg  (1/2 pill) for 2 weeks Increase the celexa to 20mg  or 1 pill for 2 weeks. And you can stop the buproprion or continue the 150mg  (1/2 of the 300mg )  Please be aware that some of the medications that you are on can sometimes cause a rare and potentially dangerous adverse reaction, called SEROTONIN SYNDROME: Symptoms of this condition include (but are not limited to):  Agitation or restlessness, confusion, rapid heart rate and high blood pressure, dilated pupils, loss of muscle coordination or twitching muscles, muscle rigidity/stiffness, sweating and/or flushing, diarrhea, headache, shivering, goose bumps. If you have any of these symptoms you may have to stop the medication. Call your health care provider immediately.  Severe serotonin syndrome can be life-threatening emergency. Signs and symptoms of a severe reaction may include: high fever, seizures, irregular heartbeat, unconsciousness or altered level of awareness or personality changes.  If you have any of these new symptoms, call 911 or have someone take you to the emergency room.   WEIGHT GAIN Can do ensure/boost  Try to make sure you are eating plenty of high calorie dense foods like: Avocado Nuts Peanut butter Oatmeal Dates Cottage cheese Greek yogurt Protein powder

## 2018-11-24 LAB — LIPID PANEL
Cholesterol: 132 mg/dL (ref <200)
HDL: 54 mg/dL (ref >=40)
LDL Cholesterol (Calc): 59 mg/dL (calc)
Non-HDL Cholesterol (Calc): 78 mg/dL (calc) (ref <130)
Total CHOL/HDL Ratio: 2.4 (calc) (ref <5.0)
Triglycerides: 108 mg/dL (ref <150)

## 2018-11-24 LAB — COMPLETE METABOLIC PANEL WITH GFR
AG Ratio: 1.6 (calc) (ref 1.0–2.5)
ALT: 28 U/L (ref 9–46)
AST: 27 U/L (ref 10–35)
Albumin: 4.1 g/dL (ref 3.6–5.1)
Alkaline phosphatase (APISO): 68 U/L (ref 35–144)
BUN: 17 mg/dL (ref 7–25)
CO2: 32 mmol/L (ref 20–32)
Calcium: 10 mg/dL (ref 8.6–10.3)
Chloride: 100 mmol/L (ref 98–110)
Creat: 1.05 mg/dL (ref 0.70–1.11)
GFR, Est African American: 75 mL/min/{1.73_m2} (ref >=60)
GFR, Est Non African American: 65 mL/min/{1.73_m2} (ref >=60)
Globulin: 2.6 g/dL (calc) (ref 1.9–3.7)
Glucose, Bld: 112 mg/dL — ABNORMAL HIGH (ref 65–99)
Potassium: 4.1 mmol/L (ref 3.5–5.3)
Sodium: 139 mmol/L (ref 135–146)
Total Bilirubin: 0.5 mg/dL (ref 0.2–1.2)
Total Protein: 6.7 g/dL (ref 6.1–8.1)

## 2018-11-24 LAB — CBC WITH DIFFERENTIAL/PLATELET
Absolute Monocytes: 851 cells/uL (ref 200–950)
Basophils Absolute: 23 cells/uL (ref 0–200)
Basophils Relative: 0.3 %
Eosinophils Absolute: 137 cells/uL (ref 15–500)
Eosinophils Relative: 1.8 %
HCT: 45.6 % (ref 38.5–50.0)
Hemoglobin: 15.6 g/dL (ref 13.2–17.1)
Lymphs Abs: 2470 cells/uL (ref 850–3900)
MCH: 32.1 pg (ref 27.0–33.0)
MCHC: 34.2 g/dL (ref 32.0–36.0)
MCV: 93.8 fL (ref 80.0–100.0)
MPV: 10.5 fL (ref 7.5–12.5)
Monocytes Relative: 11.2 %
Neutro Abs: 4119 cells/uL (ref 1500–7800)
Neutrophils Relative %: 54.2 %
Platelets: 192 10*3/uL (ref 140–400)
RBC: 4.86 10*6/uL (ref 4.20–5.80)
RDW: 12.4 % (ref 11.0–15.0)
Total Lymphocyte: 32.5 %
WBC: 7.6 10*3/uL (ref 3.8–10.8)

## 2018-11-24 LAB — TSH: TSH: 0.38 mIU/L — ABNORMAL LOW (ref 0.40–4.50)

## 2018-11-24 LAB — HEMOGLOBIN A1C
Hgb A1c MFr Bld: 5.9 % of total Hgb — ABNORMAL HIGH (ref <5.7)
Mean Plasma Glucose: 123 (calc)
eAG (mmol/L): 6.8 (calc)

## 2018-11-24 LAB — MAGNESIUM: Magnesium: 1.7 mg/dL (ref 1.5–2.5)

## 2018-11-30 ENCOUNTER — Telehealth: Payer: Self-pay | Admitting: Cardiovascular Disease

## 2018-11-30 NOTE — Telephone Encounter (Signed)
New Message    Pt is wondering if his wife can assist him to his CT appt   Please call

## 2018-11-30 NOTE — Telephone Encounter (Signed)
Pt has device check in June 29and is wanting to know if wife can be in room with pt for this appt.  Pt originially called asking about where he needs to get prep for abdominal ct informed pt appears GI ordered test and needs to contact that office Pt verbalizes understanding

## 2018-12-01 NOTE — Telephone Encounter (Signed)
Left a message for the patient to call back.  

## 2018-12-05 NOTE — Telephone Encounter (Signed)
Spoke with the patient who wanted to verify his appointment date and time. He has verbalized his understanding.

## 2018-12-07 ENCOUNTER — Other Ambulatory Visit: Payer: Self-pay | Admitting: Physician Assistant

## 2018-12-07 ENCOUNTER — Ambulatory Visit
Admission: RE | Admit: 2018-12-07 | Discharge: 2018-12-07 | Disposition: A | Discharge status: Home / Self Care | Payer: Medicare Other | Source: Ambulatory Visit | Attending: Physician Assistant | Admitting: Physician Assistant

## 2018-12-07 DIAGNOSIS — R634 Abnormal weight loss: Secondary | ICD-10-CM

## 2018-12-07 DIAGNOSIS — N429 Disorder of prostate, unspecified: Secondary | ICD-10-CM

## 2018-12-07 MED ORDER — IOPAMIDOL (ISOVUE-300) INJECTION 61%
100.0000 mL | Freq: Once | INTRAVENOUS | Status: AC | PRN
  Administered 2018-12-07: 13:00:00 100 mL via INTRAVENOUS

## 2018-12-14 ENCOUNTER — Other Ambulatory Visit: Payer: Self-pay | Admitting: Physician Assistant

## 2018-12-14 DIAGNOSIS — G8929 Other chronic pain: Secondary | ICD-10-CM

## 2018-12-15 DIAGNOSIS — N402 Nodular prostate without lower urinary tract symptoms: Secondary | ICD-10-CM | POA: Diagnosis not present

## 2018-12-15 DIAGNOSIS — R972 Elevated prostate specific antigen [PSA]: Secondary | ICD-10-CM | POA: Diagnosis not present

## 2018-12-18 ENCOUNTER — Ambulatory Visit (INDEPENDENT_AMBULATORY_CARE_PROVIDER_SITE_OTHER): Payer: Medicare Other | Admitting: *Deleted

## 2018-12-18 ENCOUNTER — Other Ambulatory Visit: Payer: Self-pay | Admitting: Urology

## 2018-12-18 ENCOUNTER — Other Ambulatory Visit (HOSPITAL_COMMUNITY): Payer: Self-pay | Admitting: Urology

## 2018-12-18 DIAGNOSIS — I442 Atrioventricular block, complete: Secondary | ICD-10-CM | POA: Diagnosis not present

## 2018-12-18 DIAGNOSIS — R972 Elevated prostate specific antigen [PSA]: Secondary | ICD-10-CM

## 2018-12-19 LAB — CUP PACEART REMOTE DEVICE CHECK
Battery Remaining Longevity: 98 mo
Battery Voltage: 3.01 V
Brady Statistic AP VP Percent: 90.98 %
Brady Statistic AP VS Percent: 0.07 %
Brady Statistic AS VP Percent: 7.87 %
Brady Statistic AS VS Percent: 1.08 %
Brady Statistic RA Percent Paced: 91.87 %
Brady Statistic RV Percent Paced: 98.84 %
Date Time Interrogation Session: 20200601055202
Implantable Lead Implant Date: 20190822
Implantable Lead Implant Date: 20190822
Implantable Lead Location: 753859
Implantable Lead Location: 753860
Implantable Lead Model: 3830
Implantable Lead Model: 5076
Implantable Pulse Generator Implant Date: 20190822
Lead Channel Impedance Value: 342 Ohm
Lead Channel Impedance Value: 380 Ohm
Lead Channel Impedance Value: 437 Ohm
Lead Channel Impedance Value: 475 Ohm
Lead Channel Pacing Threshold Amplitude: 0.625 V
Lead Channel Pacing Threshold Amplitude: 0.875 V
Lead Channel Pacing Threshold Pulse Width: 0.4 ms
Lead Channel Pacing Threshold Pulse Width: 0.4 ms
Lead Channel Sensing Intrinsic Amplitude: 5.875 mV
Lead Channel Sensing Intrinsic Amplitude: 5.875 mV
Lead Channel Sensing Intrinsic Amplitude: 9 mV
Lead Channel Sensing Intrinsic Amplitude: 9 mV
Lead Channel Setting Pacing Amplitude: 1.5 V
Lead Channel Setting Pacing Amplitude: 2.5 V
Lead Channel Setting Pacing Pulse Width: 1 ms
Lead Channel Setting Sensing Sensitivity: 2.8 mV

## 2018-12-23 ENCOUNTER — Other Ambulatory Visit: Payer: Self-pay

## 2018-12-23 ENCOUNTER — Emergency Department (HOSPITAL_COMMUNITY): Payer: Medicare Other

## 2018-12-23 ENCOUNTER — Encounter (HOSPITAL_COMMUNITY): Payer: Self-pay

## 2018-12-23 ENCOUNTER — Emergency Department (HOSPITAL_COMMUNITY)
Admission: EM | Admit: 2018-12-23 | Discharge: 2018-12-23 | Disposition: A | Discharge status: Home / Self Care | Payer: Medicare Other | Attending: Emergency Medicine | Admitting: Emergency Medicine

## 2018-12-23 DIAGNOSIS — I2510 Atherosclerotic heart disease of native coronary artery without angina pectoris: Secondary | ICD-10-CM | POA: Insufficient documentation

## 2018-12-23 DIAGNOSIS — Z79899 Other long term (current) drug therapy: Secondary | ICD-10-CM | POA: Diagnosis not present

## 2018-12-23 DIAGNOSIS — Y999 Unspecified external cause status: Secondary | ICD-10-CM | POA: Insufficient documentation

## 2018-12-23 DIAGNOSIS — M546 Pain in thoracic spine: Secondary | ICD-10-CM | POA: Insufficient documentation

## 2018-12-23 DIAGNOSIS — Z955 Presence of coronary angioplasty implant and graft: Secondary | ICD-10-CM | POA: Insufficient documentation

## 2018-12-23 DIAGNOSIS — Z950 Presence of cardiac pacemaker: Secondary | ICD-10-CM | POA: Insufficient documentation

## 2018-12-23 DIAGNOSIS — S51812A Laceration without foreign body of left forearm, initial encounter: Secondary | ICD-10-CM

## 2018-12-23 DIAGNOSIS — S59912A Unspecified injury of left forearm, initial encounter: Secondary | ICD-10-CM | POA: Diagnosis not present

## 2018-12-23 DIAGNOSIS — Z7901 Long term (current) use of anticoagulants: Secondary | ICD-10-CM | POA: Insufficient documentation

## 2018-12-23 DIAGNOSIS — Z951 Presence of aortocoronary bypass graft: Secondary | ICD-10-CM | POA: Insufficient documentation

## 2018-12-23 DIAGNOSIS — I110 Hypertensive heart disease with heart failure: Secondary | ICD-10-CM | POA: Diagnosis not present

## 2018-12-23 DIAGNOSIS — W1789XA Other fall from one level to another, initial encounter: Secondary | ICD-10-CM | POA: Insufficient documentation

## 2018-12-23 DIAGNOSIS — C44211 Basal cell carcinoma of skin of unspecified ear and external auricular canal: Secondary | ICD-10-CM | POA: Insufficient documentation

## 2018-12-23 DIAGNOSIS — Z87891 Personal history of nicotine dependence: Secondary | ICD-10-CM | POA: Insufficient documentation

## 2018-12-23 DIAGNOSIS — Y9389 Activity, other specified: Secondary | ICD-10-CM | POA: Insufficient documentation

## 2018-12-23 DIAGNOSIS — Y92017 Garden or yard in single-family (private) house as the place of occurrence of the external cause: Secondary | ICD-10-CM | POA: Insufficient documentation

## 2018-12-23 DIAGNOSIS — I5042 Chronic combined systolic (congestive) and diastolic (congestive) heart failure: Secondary | ICD-10-CM | POA: Diagnosis not present

## 2018-12-23 DIAGNOSIS — S299XXA Unspecified injury of thorax, initial encounter: Secondary | ICD-10-CM | POA: Diagnosis not present

## 2018-12-23 MED ORDER — BACITRACIN ZINC 500 UNIT/GM EX OINT
TOPICAL_OINTMENT | Freq: Once | CUTANEOUS | Status: AC
  Administered 2018-12-23: 1 via TOPICAL
  Filled 2018-12-23: qty 1.8

## 2018-12-23 NOTE — ED Provider Notes (Addendum)
[facility] DEPT Provider Note   CSN: [id number] Arrival date & time: 12/23/18  1908    History   Chief Complaint Chief Complaint  Patient presents with  . Arm Injury  . Fall    HPI Petra Sargeant. is a 85 y.o. male.     85 year old male here for mechanical fall at home.  Patient states he feels backwards about 2 feet from the ground.  Denies any head injury.  Denies any headache or neck discomfort.  Does take Eliquis.  Complains of abrasions to his left forearm.  Denies any left hand, wrist, elbow discomfort.  No shoulder discomfort.  No abdominal chest or back discomfort.  Denies any hip pain.  Called EMS and was transported here.     Past Medical History:  Diagnosis Date  . Arthritis   . CAD (coronary artery disease)   . Hyperlipidemia   . Hypertension   . Old myocardial infarct   . S/P CABG x 5 08/29/1984   LIMA to LAD,SVG to diagonal,SVG to OM,SVG to PDA and PLA  . Tremor, essential     Patient Active Problem List   Diagnosis Date Noted  . Chronic combined systolic and diastolic heart failure ([place]) 08/29/2018  . Pacemaker 08/29/2018  . TIA (transient ischemic attack) 08/29/2018  . Lumbar pain 06/09/2018  . Senile purpura ([place]) 05/11/2018  . Complete heart block ([place])   . Paroxysmal atrial flutter ([place]) 03/06/2018  . Atherosclerosis of aorta ([place]) 08/23/2016  . Basal cell carcinoma, ear 08/01/2015  . Major depression in full remission ([place]) 09/11/2014  . Essential hypertension 01/01/2014  . Vitamin D deficiency 01/01/2014  . Medication management 01/01/2014  . Other abnormal glucose 01/01/2014  . Nonsustained ventricular tachycardia ([place]) 05/23/2013  . CAD (coronary artery disease) 01/08/2013  . Cardiomyopathy, ischemic 01/08/2013  . Hyperlipidemia 01/08/2013  . LBBB (left bundle branch block) 01/08/2013    Past Surgical History:  Procedure Laterality Date  . A-FLUTTER ABLATION N/A 03/09/2018   Procedure: A-FLUTTER  ABLATION;  Surgeon: Evans Lance, MD;  Location: [facility];  Service: Cardiovascular;  Laterality: N/A;  . CHOLECYSTECTOMY  10/1990  . CORONARY ARTERY BYPASS GRAFT  1986   x5 LIMA to LAD,SVG to diagonal,SVG to OM,SVG to PDA and PLA  . CORONARY STENT PLACEMENT    . INGUINAL HERNIA REPAIR  04/25/2012   right- laparoscopic  . PACEMAKER IMPLANT N/A 03/09/2018   Procedure: PACEMAKER IMPLANT;  Surgeon: Evans Lance, MD;  Location: [facility];  Service: Cardiovascular;  Laterality: N/A;  . R/P Myoview  12/27/2008   no ischemia  . TEE WITHOUT CARDIOVERSION N/A 03/09/2018   Procedure: TRANSESOPHAGEAL ECHOCARDIOGRAM (TEE);  Surgeon: Fay Records, MD;  Location: [facility] ENDOSCOPY;  Service: Cardiovascular;  Laterality: N/A;  Bubble Study  . US ECHOCARDIOGRAPHY  12/27/2008   concentric LVH,mild to mod MR, TR and pulmonary hypertension. EF 45-50%        Home Medications    Prior to Admission medications   Medication Sig Start Date End Date Taking? Authorizing Provider  acetaminophen (TYLENOL) 325 MG tablet Take 650 mg by mouth every 6 (six) hours as needed.    [provider]  apixaban (ELIQUIS) 5 MG TABS tablet Take 1 tablet (5 mg total) by mouth 2 (two) times daily. 06/14/18   Croitoru, Mihai, MD  atorvastatin (LIPITOR) 40 MG tablet Take 1 tablet daily for Cholesterol 08/14/18   Unk Pinto, MD  buPROPion (WELLBUTRIN XL) 150 MG  24 hr tablet Take 1 tablet (150 mg total) by mouth every morning. 11/23/18 11/23/19  Vicie Mutters, PA-C  citalopram (CELEXA) 20 MG tablet Take 1 tablet (20 mg total) by mouth daily. 11/23/18 11/23/19  Vicie Mutters, PA-C  co-enzyme Q-10 30 MG capsule Take 30 mg by mouth daily.     [provider]  diazepam (VALIUM) 5 MG tablet Take 1/2 to 1 tablet 2 to 3 x / day only if needed for Muscle spasms  & please try to limit to 5 days /week to avoid addiction 08/30/18   Unk Pinto, MD  Digestive Enzymes CAPS Take 1-2 capsules by mouth as needed  (to aid digestive function).     [provider]  gabapentin (NEURONTIN) 100 MG capsule TAKE 1 CAPSULE BY MOUTH THREE TIMES DAILY FOR PAIN 12/14/18   Vicie Mutters, PA-C  hydrochlorothiazide (HYDRODIURIL) 25 MG tablet TAKE 1 TABLET DAILY ONLY  IF  NEEDED  FOR  ANKLE  SWELLING 11/13/18   Unk Pinto, MD  losartan (COZAAR) 25 MG tablet Take 1 tablet (25 mg total) by mouth daily. 03/10/18 03/10/19  Chanetta Marshall K, NP  Magnesium Chloride (MAGNESIUM DR PO) Take 2 tablets by mouth daily.     [provider]  metoprolol succinate (TOPROL-XL) 25 MG 24 hr tablet TAKE 1 TABLET BY MOUTH ONCE DAILY 06/02/18   Unk Pinto, MD  nitroGLYCERIN (NITROSTAT) 0.4 MG SL tablet Place 1 tablet (0.4 mg total) under the tongue every 5 (five) minutes as needed for chest pain. 08/16/17   Liane Comber, NP    Family History Family History  Problem Relation Age of Onset  . Cancer Father        bone  . Cancer Brother        lymphnode    Social History Social History   Tobacco Use  . Smoking status: Former Smoker    Last attempt to quit: 07/19/1970    Years since quitting: 48.4  . Smokeless tobacco: Never Used  Substance Use Topics  . Alcohol use: No    Alcohol/week: 0.0 standard drinks  . Drug use: No     Allergies   Erythromycin   Review of Systems Review of Systems  All other systems reviewed and are negative.    Physical Exam Updated Vital Signs BP 122/66 (BP Location: Left Arm)   Pulse 84   Temp 98.5 F (36.9 C) (Oral)   Resp 17   SpO2 96%   Physical Exam Vitals signs and nursing note reviewed.  Constitutional:      General: He is not in acute distress.    Appearance: Normal appearance. He is well-developed. He is not toxic-appearing.  HENT:     Head: Normocephalic and atraumatic.  Eyes:     General: Lids are normal.     Conjunctiva/sclera: Conjunctivae normal.     Pupils: Pupils are equal, round, and reactive to light.  Neck:     Musculoskeletal:  Normal range of motion and neck supple. Normal range of motion. No pain with movement.     Thyroid: No thyroid mass.     Trachea: No tracheal deviation.  Cardiovascular:     Rate and Rhythm: Normal rate and regular rhythm.     Heart sounds: Normal heart sounds. No murmur. No gallop.   Pulmonary:     Effort: Pulmonary effort is normal. No respiratory distress.     Breath sounds: Normal breath sounds. No stridor. No decreased breath sounds, wheezing, rhonchi or rales.  Abdominal:  General: Bowel sounds are normal. There is no distension.     Palpations: Abdomen is soft.     Tenderness: There is no abdominal tenderness. There is no rebound.  Musculoskeletal: Normal range of motion.        General: No tenderness.       Arms:  Skin:    General: Skin is warm and dry.     Findings: No abrasion or rash.  Neurological:     Mental Status: He is alert and oriented to person, place, and time.     GCS: GCS eye subscore is 4. GCS verbal subscore is 5. GCS motor subscore is 6.     Cranial Nerves: No cranial nerve deficit.     Sensory: No sensory deficit.  Psychiatric:        Speech: Speech normal.        Behavior: Behavior normal.      ED Treatments / Results  Labs (all labs ordered are listed, but only abnormal results are displayed) Labs Reviewed - No data to display  EKG None  Radiology No results found.  Procedures Procedures (including critical care time)  Medications Ordered in ED Medications - No data to display   Initial Impression / Assessment and Plan / ED Course  I have reviewed the triage vital signs and the nursing notes.  Pertinent labs & imaging results that were available during my care of the patient were reviewed by me and considered in my medical decision making (see chart for details).        X-ray forearm negative.  Patient is wound dressed by nursing and he will be given instructions for wound care  Discharge patient complained of having some  midthoracic back pain.  Will order T-spine.  Dr. Ralene Bathe will dispo.  Final Clinical Impressions(s) / ED Diagnoses   Final diagnoses:  None    ED Discharge Orders    None       Lacretia Leigh, MD 12/23/18 [place]    Lacretia Leigh, MD 12/23/18 2055

## 2018-12-23 NOTE — ED Triage Notes (Signed)
Pt reports that he fell backwards off of a platform when he was filling his bird feeders about 30 mins ago. He states that that platform is about 1.5-2 ft off the ground. Reports that he fell back on to some rocks. He presents with skin tears and abrasion covering his medial L forearm. No obvious deformities. He denies head injury or LOC. Pt does take Eliquis. Bleeding controlled.

## 2018-12-23 NOTE — ED Notes (Signed)
Patient complained of mild thoracic back pain while reviewing discharge. MD notified.

## 2018-12-26 ENCOUNTER — Encounter: Payer: Self-pay | Admitting: Cardiology

## 2018-12-26 ENCOUNTER — Encounter: Payer: Self-pay | Admitting: Adult Health

## 2018-12-26 ENCOUNTER — Ambulatory Visit (INDEPENDENT_AMBULATORY_CARE_PROVIDER_SITE_OTHER): Payer: Medicare Other | Admitting: Adult Health

## 2018-12-26 ENCOUNTER — Other Ambulatory Visit: Payer: Self-pay

## 2018-12-26 VITALS — BP 120/66 | HR 90 | Temp 97.5°F | Wt 163.0 lb

## 2018-12-26 DIAGNOSIS — I255 Ischemic cardiomyopathy: Secondary | ICD-10-CM | POA: Diagnosis not present

## 2018-12-26 DIAGNOSIS — L089 Local infection of the skin and subcutaneous tissue, unspecified: Secondary | ICD-10-CM

## 2018-12-26 MED ORDER — CEPHALEXIN 500 MG PO CAPS: ORAL_CAPSULE | ORAL | 0 refills | Status: DC

## 2018-12-26 NOTE — Progress Notes (Signed)
Assessment and Plan:  Multiple superficial wounds with infection Numerous skin tears/superficial wounds with some edema, scant purulent discharge to distal lateral forarm ED dressing removed, non-viable skin flaps were trimmed, extremity irrigated with sterile saline Sugar/betadine slurry applied to distal skin tear with purulent discharge, non-adherent dressing and ABD applied; all other wounds - topical abx ointment, non-adherent dressing applied Wrapped with cling wrap He can take tylenol for mild pain if needed Monitor for worsening pain, redness, edema, numbness/tingling of fingers, fever/chills Sent in keflex to initiate, information provided Keep close follow up as scheduled in 2 days; can return tomorrow for recheck if concerns - cephALEXin (KEFLEX) 500 MG capsule; Take 1 capsule 4 x/day after meals & bedtime for infection  Dispense: 40 capsule; Refill: 0  Further disposition pending results of labs. Discussed med's effects and SE's.   Over 30 minutes of exam, counseling, chart review, and critical decision making was performed.   Future Appointments  Date Time Provider [place]  12/28/2018  3:30 PM Vicie Mutters, PA-C [facility] None  01/01/2019 12:00 PM [facility] INJ 1 [facility] [place]  01/01/2019  3:00 PM WL-NM 1 [facility]NM [place]  01/15/2019 12:30 PM Croitoru, Mihai, MD [facility]  03/12/2019  3:00 PM Unk Pinto, MD [facility] None  03/19/2019  7:30 AM [facility] DEVICE REMOTES [facility]  12/04/2019  3:45 PM Vicie Mutters, PA-C [facility] None    ------------------------------------------------------------------------------------------------------------------   HPI BP 120/66   Pulse 90   Temp (!) 97.5 F (36.4 C)   Wt 163 lb (73.9 kg)   SpO2 93%   BMI 26.31 kg/m   85 y.o.male presents for wound check due to concerns with possible complication; he had a mechanical fall on 12/23/2018 in his tool shed and sustained multiple superficial  skin tears when caught arm on door, was evaluated by ED, L arm xray and c spine were negative. No sutures were placed, dressing was applied and he was advised to change dressing daily at home. He reports he has been unable to do so, wife unable to help, concerned due to increasing swelling of the extremity and presents today. Denies significant pain, "mild burning" 1-2/10, denies fever/chills, ache, radiating pain, nausea, numbness/tingling of fingers.   Past Medical History:  Diagnosis Date  . Arthritis   . CAD (coronary artery disease)   . Hyperlipidemia   . Hypertension   . Old myocardial infarct   . S/P CABG x 5 08/29/1984   LIMA to LAD,SVG to diagonal,SVG to OM,SVG to PDA and PLA  . Tremor, essential      Allergies  Allergen Reactions  . Erythromycin Nausea Only    Current Outpatient Medications on File Prior to Visit  Medication Sig  . acetaminophen (TYLENOL) 325 MG tablet Take 650 mg by mouth every 6 (six) hours as needed.  [name] apixaban (ELIQUIS) 5 MG TABS tablet Take 1 tablet (5 mg total) by mouth 2 (two) times daily.  [name] atorvastatin (LIPITOR) 40 MG tablet Take 1 tablet daily for Cholesterol  . buPROPion (WELLBUTRIN XL) 150 MG 24 hr tablet Take 1 tablet (150 mg total) by mouth every morning.  . citalopram (CELEXA) 20 MG tablet Take 1 tablet (20 mg total) by mouth daily.  [name] co-enzyme Q-10 30 MG capsule Take 30 mg by mouth daily.   . diazepam (VALIUM) 5 MG tablet Take 1/2 to 1 tablet 2 to 3 x / day only if needed for Muscle spasms  & please try to limit to 5 days /week to  avoid addiction  . Digestive Enzymes CAPS Take 1-2 capsules by mouth as needed (to aid digestive function).   . gabapentin (NEURONTIN) 100 MG capsule TAKE 1 CAPSULE BY MOUTH THREE TIMES DAILY FOR PAIN  . hydrochlorothiazide (HYDRODIURIL) 25 MG tablet TAKE 1 TABLET DAILY ONLY  IF  NEEDED  FOR  ANKLE  SWELLING  . losartan (COZAAR) 25 MG tablet Take 1 tablet (25 mg total) by mouth daily.  . Magnesium Chloride  (MAGNESIUM DR PO) Take 2 tablets by mouth daily.   . metoprolol succinate (TOPROL-XL) 25 MG 24 hr tablet TAKE 1 TABLET BY MOUTH ONCE DAILY  . nitroGLYCERIN (NITROSTAT) 0.4 MG SL tablet Place 1 tablet (0.4 mg total) under the tongue every 5 (five) minutes as needed for chest pain.   No current facility-administered medications on file prior to visit.     ROS: all negative except above.   Physical Exam:  BP 120/66   Pulse 90   Temp (!) 97.5 F (36.4 C)   Wt 163 lb (73.9 kg)   SpO2 93%   BMI 26.31 kg/m   General Appearance: Well nourished, in no apparent distress. Eyes: PERRLA, conjunctiva no swelling or erythema ENT/Mouth:  Hearing normal.  Neck: Supple Respiratory: Respiratory effort normal, BS equal bilaterally without rales, rhonchi, wheezing or stridor.  Cardio: RRR with no MRGs. Brisk peripheral pulses without edema in lower extremities, pulses symmetrical upper extremities, generalized nonpitting edema to left dorsum of hand and forearm  Lymphatics: Non tender without lymphadenopathy.  Musculoskeletal: Full ROM bil wrist, elbows, grips symmetrical, slow steady gait with cane Skin: Warm, dry; left arm with multiple skin tears after removal of dressing, approx 4 cm x 0.5 cm to distal radial forearm with scant purulent discharge, open superficial wound approx 2 cm x 8 cm inferior to elbow, no dignificant discharge but with skin flap non-approximated (trimmed), skin tear approx 3 cm with flap adhered, no serosanguinous discharge lateral elbow, superficial open wound post/superior elbow approx 2 cm x 0.5 cm without discharge. Generalized arm is mildly edematous, with dependent ecchymosis. No erythema bordering wounds but mildly pink throughout.  Neuro: Normal muscle tone,  Sensation intact. Fine resting tremor bilateral upper extremities.  Psych: Awake and oriented X 3, normal affect, Insight and Judgment appropriate.     Izora Ribas, NP 2:07 PM [facility]

## 2018-12-26 NOTE — Progress Notes (Signed)
Remote pacemaker transmission.   

## 2018-12-26 NOTE — Patient Instructions (Signed)
Can take ibuprofen, aleve or tylenol if needed for mild pain  Keflex 500 mg 4 times a day with each meal and prior to bedtime - take with a glass a milk or food  Call with any questions prior to next visit in 2 days    Wound Infection  A wound infection happens when germs start to grow in the wound. Germs that cause wound infections are most commonly bacteria. Other types of infections can occur as well. In some cases, infection can cause the wound to break open. Wound infections need treatment. If a wound infection is left untreated, complications can occur. This may include an infection in your bloodstream (sepsis) or in a bone (osteomyelitis). What are the causes? This condition is caused by germs growing in the wound. What increases the risk? The following factors may make you more likely to develop this condition:  Having a weak body defense system (immune system).  Having diabetes.  Taking steroid medicines for a long time (chronic use).  Smoking.  Older age.  Being overweight. What are the signs or symptoms? Symptoms of this condition include:  Having more redness, swelling, or pain at the wound site.  Having more blood, pus, or fluid at the wound site.  A bad smell coming from a wound or bandage (dressing).  Having a fever.  Feeling tired or fatigued. How is this diagnosed? This condition is diagnosed with a medical history and physical exam. You may also have blood tests. How is this treated? This condition is treated with an antibiotic medicine. The infection should improve 24-48 hours after you start antibiotics. Any redness around the wound should stop spreading, and the wound should be less painful. Follow these instructions at home: Medicines  Take or apply over-the-counter and prescription medicines only as told by your health care provider.  If you were prescribed antibiotic medicine, take or apply it as told by your health care provider. Do not stop  using the antibiotic even if your condition improves. Wound care  Clean the wound each day or as told by your health care provider. ? Wash the wound with mild soap and water. ? Rinse the wound with water to remove all soap. ? Pat the wound dry with a clean towel. Do not rub it.  Follow instructions from your health care provider about how to take care of your wound. Make sure you: ? Wash your hands with soap and water before you change your dressing. If soap and water are not available, use hand sanitizer. ? Change your dressing as told by your health care provider. ? Leave stitches (sutures), skin glue, or adhesive strips in place, if this applies. These skin closures may need to stay in place for 2 weeks or longer. If adhesive strip edges start to loosen and curl up, you may trim the loose edges. Do not remove adhesive strips completely unless your health care provider tells you to do that. Some wounds are left open to heal on their own.  Check your wound every day for signs of infection. Watch for: ? More redness, swelling, or pain. ? More fluid or blood. ? Warmth. ? Pus or a bad smell. General instructions  Keep the dressing dry until your health care provider says it can be removed.  Do not take baths, swim, use a hot tub, or do anything that would put your wound underwater until your health care provider approves.  Raise (elevate) the injured area above the level of your heart  while you are sitting or lying down.  Do not scratch or pick at the wound.  Keep all follow-up visits as told by your health care provider. This is important. Contact a health care provider if:  Your pain is not controlled with medicine.  You have more redness, swelling, or pain around your wound.  You have more fluid or blood coming from your wound.  Your wound feels warm to the touch.  You have pus coming from your wound.  You continue to notice a bad smell coming from your wound or your  dressing.  Your wound that was closed breaks open. Get help right away if:  You have a red streak going away from your wound.  You have a fever. This information is not intended to replace advice given to you by your health care provider. Make sure you discuss any questions you have with your health care provider. Document Released: 04/03/2003 Document Revised: 12/17/2015 Document Reviewed: 12/23/2014 Elsevier Interactive Patient Education  2019 [place].    Cephalexin tablets or capsules What is this medicine? CEPHALEXIN (sef a LEX in) is a cephalosporin antibiotic. It is used to treat certain kinds of bacterial infections It will not work for colds, flu, or other viral infections. This medicine may be used for other purposes; ask your health care provider or pharmacist if you have questions. COMMON BRAND NAME(S): Biocef, Daxbia, Keflex, Keftab What should I tell my health care provider before I take this medicine? They need to know if you have any of these conditions: -kidney disease -stomach or intestine problems, especially colitis -an unusual or allergic reaction to cephalexin, other cephalosporins, penicillins, other antibiotics, medicines, foods, dyes or preservatives -pregnant or trying to get pregnant -breast-feeding How should I use this medicine? Take this medicine by mouth with a full glass of water. Follow the directions on the prescription label. This medicine can be taken with or without food. Take your medicine at regular intervals. Do not take your medicine more often than directed. Take all of your medicine as directed even if you think you are better. Do not skip doses or stop your medicine early. Talk to your pediatrician regarding the use of this medicine in children. While this drug may be prescribed for selected conditions, precautions do apply. Overdosage: If you think you have taken too much of this medicine contact a poison control center or emergency room  at once. NOTE: This medicine is only for you. Do not share this medicine with others. What if I miss a dose? If you miss a dose, take it as soon as you can. If it is almost time for your next dose, take only that dose. Do not take double or extra doses. There should be at least 4 to 6 hours between doses. What may interact with this medicine? -probenecid -some other antibiotics This list may not describe all possible interactions. Give your health care provider a list of all the medicines, herbs, non-prescription drugs, or dietary supplements you use. Also tell them if you smoke, drink alcohol, or use illegal drugs. Some items may interact with your medicine. What should I watch for while using this medicine? Tell your doctor or health care professional if your symptoms do not begin to improve in a few days. Do not treat diarrhea with over the counter products. Contact your doctor if you have diarrhea that lasts more than 2 days or if it is severe and watery. If you have diabetes, you may get a false-positive  result for sugar in your urine. Check with your doctor or health care professional. What side effects may I notice from receiving this medicine? Side effects that you should report to your doctor or health care professional as soon as possible: -allergic reactions like skin rash, itching or hives, swelling of the face, lips, or tongue -breathing problems -pain or trouble passing urine -redness, blistering, peeling or loosening of the skin, including inside the mouth -severe or watery diarrhea -unusually weak or tired -yellowing of the eyes, skin Side effects that usually do not require medical attention (report to your doctor or health care professional if they continue or are bothersome): -gas or heartburn -genital or anal irritation -headache -joint or muscle pain -nausea, vomiting This list may not describe all possible side effects. Call your doctor for medical advice about side  effects. You may report side effects to FDA at 1-800-FDA-1088. Where should I keep my medicine? Keep out of the reach of children. Store at room temperature between 59 and 86 degrees F (15 and 30 degrees C). Throw away any unused medicine after the expiration date. NOTE: This sheet is a summary. It may not cover all possible information. If you have questions about this medicine, talk to your doctor, pharmacist, or health care provider.  2019 Elsevier/Gold Standard (2007-10-09 17:09:13)

## 2018-12-27 NOTE — Progress Notes (Signed)
Assessment and Plan:  Weight loss Weight has improved, patient states he still has depression, encouraged to seek counseling, will increase celexa to 40mg  -     citalopram (CELEXA) 40 MG tablet; Take 1 tablet (40 mg total) by mouth daily.  Multiple superficial wounds with infection -     cephALEXin (KEFLEX) 500 MG capsule; Take 1 capsule 4 x/day after meals & bedtime for infection - finish ABX Change dressing on Saturday, follow up Tuesday - elevate arm Call the office if any fever, chills, worsening redness/swelling up your arm, pain, any changes in symptoms.  Recurrent major depressive disorder, in full remission ([place]) -     citalopram (CELEXA) 40 MG tablet; Take 1 tablet (40 mg total) by mouth daily. Weight has improved, patient states he still has depression, encouraged to seek counseling, will increase celexa to 40mg     Future Appointments  Date Time Provider [place]  01/01/2019 12:00 PM [facility] INJ 1 [facility] [place]  01/01/2019  3:00 PM WL-NM 1 [facility]NM [place]  01/15/2019 12:30 PM Croitoru, Dani Gobble, MD [facility]  03/12/2019  3:00 PM Unk Pinto, MD [facility] None  03/19/2019  7:30 AM [facility] DEVICE REMOTES [facility]  12/04/2019  3:45 PM Vicie Mutters, PA-C [facility] None    HPI 85 y.o.male presents for follow up for weight loss.   He complained of weight loss last visit, down 16 lbs from Nov. States he is not eating well and he does not have an appetite.  Denies night sweats, AB pain, SOB, CP.  He had CT AB that did not show any acute causes of weight loss.  last cologuard 2019, colonoscopy 2016, Remote history of smoking, CXR 02/2018 He was taken off wellbutrin and put on celexa for possible depression. He is suppose to add on ensure.  BMI is Body mass index is 26.5 kg/m., he is working on diet and exercise. Wt Readings from Last 3 Encounters:  12/28/18 164 lb 3.2 oz (74.5 kg)  12/26/18 163 lb (73.9 kg)  11/23/18 150 lb  (68 kg)    He has had a fall since being seen last, seen in the ER with negative xray.  Dressed here in the office and given keflex he is on 4 a day, still on it.     Past Medical History:  Diagnosis Date  . Arthritis   . CAD (coronary artery disease)   . Hyperlipidemia   . Hypertension   . Old myocardial infarct   . S/P CABG x 5 08/29/1984   LIMA to LAD,SVG to diagonal,SVG to OM,SVG to PDA and PLA  . Tremor, essential      Allergies  Allergen Reactions  . Erythromycin Nausea Only    Current Outpatient Medications on File Prior to Visit  Medication Sig  . acetaminophen (TYLENOL) 325 MG tablet Take 650 mg by mouth every 6 (six) hours as needed.  [name] apixaban (ELIQUIS) 5 MG TABS tablet Take 1 tablet (5 mg total) by mouth 2 (two) times daily.  [name] atorvastatin (LIPITOR) 40 MG tablet Take 1 tablet daily for Cholesterol  . buPROPion (WELLBUTRIN XL) 150 MG 24 hr tablet Take 1 tablet (150 mg total) by mouth every morning.  [name] co-enzyme Q-10 30 MG capsule Take 30 mg by mouth daily.   . diazepam (VALIUM) 5 MG tablet Take 1/2 to 1 tablet 2 to 3 x / day only if needed for Muscle spasms  & please try to limit to 5 days /week to avoid addiction  .  Digestive Enzymes CAPS Take 1-2 capsules by mouth as needed (to aid digestive function).   . gabapentin (NEURONTIN) 100 MG capsule TAKE 1 CAPSULE BY MOUTH THREE TIMES DAILY FOR PAIN  . hydrochlorothiazide (HYDRODIURIL) 25 MG tablet TAKE 1 TABLET DAILY ONLY  IF  NEEDED  FOR  ANKLE  SWELLING  . losartan (COZAAR) 25 MG tablet Take 1 tablet (25 mg total) by mouth daily.  . Magnesium Chloride (MAGNESIUM DR PO) Take 2 tablets by mouth daily.   . metoprolol succinate (TOPROL-XL) 25 MG 24 hr tablet TAKE 1 TABLET BY MOUTH ONCE DAILY  . nitroGLYCERIN (NITROSTAT) 0.4 MG SL tablet Place 1 tablet (0.4 mg total) under the tongue every 5 (five) minutes as needed for chest pain.   No current facility-administered medications on file prior to visit.     ROS: all  negative except above.   Physical Exam: Filed Weights   12/28/18 1544  Weight: 164 lb 3.2 oz (74.5 kg)   BP 118/62   Pulse 71   Temp 98.2 F (36.8 C)   Wt 164 lb 3.2 oz (74.5 kg)   SpO2 96%   BMI 26.50 kg/m  General Appearance: Well nourished, in no apparent distress. Eyes: PERRLA, conjunctiva no swelling or erythema ENT/Mouth:  Hearing normal.  Neck: Supple Respiratory: Respiratory effort normal, BS equal bilaterally without rales, rhonchi, wheezing or stridor.  Cardio: RRR with no MRGs. Brisk peripheral pulses without edema in lower extremities, pulses symmetrical upper extremities, generalized nonpitting edema to left dorsum of hand and forearm  Lymphatics: Non tender without lymphadenopathy.  Musculoskeletal: Full ROM bil wrist, elbows, grips symmetrical, slow steady gait with cane Skin: Warm, dry; left arm with multiple skin tears after removal of dressing, approx 4 cm x 0.5 cm to distal radial forearm with serosanguinous drainage open superficial wound approx 2 cm x 8 cm inferior to elbow,  skin tear approx 3 cm with flap adhered, no serosanguinous discharge lateral elbow, superficial open wound post/superior elbow approx 2 cm x 0.5 cm without discharge. Generalized arm is mildly edematous, with dependent ecchymosis. Hematoma at proximal elbow and along ulnar side of forearm.  Neuro: Normal muscle tone,  Sensation intact. Fine resting tremor bilateral upper extremities.  Psych: Awake and oriented X 3, normal affect, Insight and Judgment appropriate. Vicie Mutters, PA-C 4:12 PM [facility]

## 2018-12-28 ENCOUNTER — Other Ambulatory Visit: Payer: Self-pay

## 2018-12-28 ENCOUNTER — Encounter: Payer: Self-pay | Admitting: Physician Assistant

## 2018-12-28 ENCOUNTER — Ambulatory Visit (INDEPENDENT_AMBULATORY_CARE_PROVIDER_SITE_OTHER): Payer: Medicare Other | Admitting: Physician Assistant

## 2018-12-28 VITALS — BP 118/62 | HR 71 | Temp 98.2°F | Wt 164.2 lb

## 2018-12-28 DIAGNOSIS — T07XXXA Unspecified multiple injuries, initial encounter: Secondary | ICD-10-CM | POA: Diagnosis not present

## 2018-12-28 DIAGNOSIS — I255 Ischemic cardiomyopathy: Secondary | ICD-10-CM | POA: Diagnosis not present

## 2018-12-28 DIAGNOSIS — F3342 Major depressive disorder, recurrent, in full remission: Secondary | ICD-10-CM | POA: Diagnosis not present

## 2018-12-28 DIAGNOSIS — L089 Local infection of the skin and subcutaneous tissue, unspecified: Secondary | ICD-10-CM

## 2018-12-28 DIAGNOSIS — R634 Abnormal weight loss: Secondary | ICD-10-CM | POA: Diagnosis not present

## 2018-12-28 MED ORDER — CITALOPRAM HYDROBROMIDE 40 MG PO TABS: 40.0000 mg | ORAL_TABLET | Freq: Every day | ORAL | 2 refills | Status: DC

## 2018-12-28 MED ORDER — CEPHALEXIN 500 MG PO CAPS: ORAL_CAPSULE | ORAL | 0 refills | Status: AC

## 2018-12-28 NOTE — Patient Instructions (Addendum)
Take 2 of the celexa 20mg  until your run out I will send in 40 mg celexa, take that with wellbutrin  Please follow up Tuesday in the office Please change the dressing on saturday if you can Please elevate that arm/hand Call if you get more pain, the warmth/swelling gets worse up your arm You get fever or chills call the office  Counseling services  t I suggest calling your insurance and finding out who is in your network and THEN calling those people or looking them up on google.   I'm a big fan of Cognitive Behavioral Therapy, look this up on You tube or check with the therapist you see if they are certified.  This form of therapy helps to teach you skills to better handle with current situation that are causing anxiety or depression.   Please be aware that some of the medications that you are on can sometimes cause a rare and potentially dangerous adverse reaction, called SEROTONIN SYNDROME: Symptoms of this condition include (but are not limited to):  Agitation or restlessness, confusion, rapid heart rate and high blood pressure, dilated pupils, loss of muscle coordination or twitching muscles, muscle rigidity/stiffness, sweating and/or flushing, diarrhea, headache, shivering, goose bumps. If you have any of these symptoms you may have to stop the medication. Call your health care provider immediately.  Severe serotonin syndrome can be life-threatening emergency. Signs and symptoms of a severe reaction may include: high fever, seizures, irregular heartbeat, unconsciousness or altered level of awareness or personality changes.  If you have any of these new symptoms, call 911 or have someone take you to the emergency room.    Skin Tear Care A skin tear is a wound in which the top layers of skin have peeled off the deeper skin or tissues underneath them. This is a common problem as people get older because the skin becomes thinner and more fragile. In addition, some medicines, such as oral  corticosteroids, can lead to skin thinning if they are taken for long periods of time. A skin tear is often repaired with tape or skin adhesive strips. Depending on the location of the wound, a bandage (dressing) may be applied over the tape or skin adhesive strips. Follow these instructions at home: Wound care Clean the wound as told by your health care provider. You may be instructed to keep the wound dry for the first few days. If you are told to clean the wound: Wash the wound with mild soap and water or a salt-water (saline) solution. Rinse the wound with water to remove all soap. Do not rub the wound dry. Let the wound air dry. Change any dressings as told by your health care provider. This includes changing the dressing if it gets wet, gets dirty, or starts to smell bad. Do not scratch or pick at the wound. Protect the injured area until it has healed. Check your wound every day for signs of infection. Check for: More redness, swelling, or pain. More fluid or blood. Warmth. Pus or a bad smell. Medicines  Take over-the-counter and prescription medicines only as told by your health care provider. If you were prescribed an antibiotic medicine, take or apply it as told by your health care provider. Do not stop using the antibiotic even if your condition improves. General instructions Keep the dressing dry as told by your health care provider. Do not take baths, swim, or do anything that puts your wound underwater until your health care provider approves. Keep all follow-up  visits as told by your health care provider. This is important. Contact a health care provider if: You have more redness, swelling, or pain around your wound. You have more fluid or blood coming from your wound. Your wound feels warm to the touch. You have pus or a bad smell coming from your wound. Get help right away if: You have a red streak that goes away from the skin tear. You have a fever and chills and your  symptoms suddenly get worse. This information is not intended to replace advice given to you by your health care provider. Make sure you discuss any questions you have with your health care provider. Document Released: 03/30/2001 Document Revised: 02/29/2016 Document Reviewed: 05/26/2015 Elsevier Interactive Patient Education  [employer].

## 2018-12-29 ENCOUNTER — Ambulatory Visit (HOSPITAL_COMMUNITY): Payer: Medicare Other

## 2018-12-29 ENCOUNTER — Emergency Department (HOSPITAL_COMMUNITY)
Admission: EM | Admit: 2018-12-29 | Discharge: 2018-12-29 | Disposition: A | Discharge status: Home / Self Care | Payer: Medicare Other | Attending: Emergency Medicine | Admitting: Emergency Medicine

## 2018-12-29 ENCOUNTER — Other Ambulatory Visit (HOSPITAL_COMMUNITY): Payer: Medicare Other

## 2018-12-29 ENCOUNTER — Encounter (HOSPITAL_COMMUNITY): Payer: Self-pay

## 2018-12-29 ENCOUNTER — Other Ambulatory Visit: Payer: Self-pay

## 2018-12-29 DIAGNOSIS — I4892 Unspecified atrial flutter: Secondary | ICD-10-CM | POA: Diagnosis not present

## 2018-12-29 DIAGNOSIS — I252 Old myocardial infarction: Secondary | ICD-10-CM | POA: Insufficient documentation

## 2018-12-29 DIAGNOSIS — I25810 Atherosclerosis of coronary artery bypass graft(s) without angina pectoris: Secondary | ICD-10-CM | POA: Diagnosis not present

## 2018-12-29 DIAGNOSIS — I110 Hypertensive heart disease with heart failure: Secondary | ICD-10-CM | POA: Diagnosis not present

## 2018-12-29 DIAGNOSIS — Z5189 Encounter for other specified aftercare: Secondary | ICD-10-CM

## 2018-12-29 DIAGNOSIS — Z79899 Other long term (current) drug therapy: Secondary | ICD-10-CM | POA: Insufficient documentation

## 2018-12-29 DIAGNOSIS — Z0489 Encounter for examination and observation for other specified reasons: Secondary | ICD-10-CM | POA: Diagnosis not present

## 2018-12-29 DIAGNOSIS — I5042 Chronic combined systolic (congestive) and diastolic (congestive) heart failure: Secondary | ICD-10-CM | POA: Diagnosis not present

## 2018-12-29 DIAGNOSIS — Z950 Presence of cardiac pacemaker: Secondary | ICD-10-CM | POA: Diagnosis not present

## 2018-12-29 DIAGNOSIS — Z87891 Personal history of nicotine dependence: Secondary | ICD-10-CM | POA: Diagnosis not present

## 2018-12-29 DIAGNOSIS — Z4800 Encounter for change or removal of nonsurgical wound dressing: Secondary | ICD-10-CM | POA: Diagnosis not present

## 2018-12-29 DIAGNOSIS — S51812D Laceration without foreign body of left forearm, subsequent encounter: Secondary | ICD-10-CM | POA: Diagnosis not present

## 2018-12-29 NOTE — ED Triage Notes (Signed)
Pt here for a wound check on his L elbow. He fell last week. Denies fever, but states that he doesn't think it looks good.

## 2018-12-29 NOTE — ED Notes (Signed)
L forearm and elbow wrapped with telfa and gauze as instructed. Pt verbalized understanding about dressing changes and given supplies.

## 2018-12-29 NOTE — Discharge Instructions (Signed)
Continue keeping your wound clean with a clean bandage. Continue your antibiotics as prescribed.  Elevate your arm as much as possible to help with swelling.

## 2018-12-29 NOTE — Progress Notes (Signed)
Assessment and Plan:  Multiple superficial wounds with infection -     cephALEXin (KEFLEX) 500 MG capsule; Take 1 capsule 4 x/day after meals & bedtime for infection. Tolerating medication well.  -No S or S of infection. - finish AB   Change dressing on today, follow up early next week. - elevate arm Call the office if any fever, chills, worsening redness/swelling up your arm, pain, any changes in symptoms.  Weight loss Down 2lbs from las visit, patient states he still has depression, encouraged to seek counseling, will increase celexa to 40mg  -     citalopram (CELEXA) 40 MG tablet; Take 1 tablet (40 mg total) by mouth daily.  Recurrent major depressive disorder, in full remission ([place]) -     citalopram (CELEXA) 40 MG tablet; Take 1 tablet (40 mg total) by mouth daily. Weight has improved, patient states he still has depression, encouraged to seek counseling, will increase celexa to 40mg     Future Appointments  Date Time Provider [place]  01/08/2019  2:30 PM Vicie Mutters, PA-C [facility] None  01/15/2019 12:30 PM Croitoru, Dani Gobble, MD [facility]  03/12/2019  3:00 PM Unk Pinto, MD [facility] None  03/19/2019  7:30 AM [facility] DEVICE REMOTES [facility]  12/04/2019  3:45 PM Vicie Mutters, PA-C [facility] None    HPI 85 y.o.male presenting for wound check of in tears to his left forearm after mechanical fall on 12/23/2018.  He is being seen every two days for wound recheck and dressing changes initally.  He had an emergency room visit 12/29/18 as the bandage fell off  and he was concerned for the appearance of the wound.  It was noted no evidence of infection. At this visit he was noted to have some generalized edema likely related to chronic anticoagulation.  He continues to take Keflex as instructed. As dischargge from ED he was encouraged to elevate extremity, ice as needed, and to continue wound checks by PCP.  Send with some bandages to  change as needed if dressings become too moist.    He is here today for evaluation.  Reports he is doing well and his sone was able to help him change the dressing two days ago.  It has been four days since he has been seen in the office.  No breakthrough drainage noted on bandage.  Denies pain, numbness, tingling in left extremity.  *He complained of weight loss last visit, down 16 lbs from Nov. States he is not eating well and he does not have an appetite. Reports mild improvement with appetite today.  Denies night sweats, AB pain, SOB, CP.   He had CT AB that did not show any acute causes of weight loss.  last cologuard 2019, colonoscopy 2016, Remote history of smoking, CXR 02/2018  He was taken off wellbutrin and put on celexa for possible depression. He is suppose to add on ensure.  BMI is Body mass index is 26.18 kg/m., he is working on diet and exercise. Wt Readings from Last 3 Encounters:  01/02/19 162 lb 3.2 oz (73.6 kg)  12/28/18 164 lb 3.2 oz (74.5 kg)  12/26/18 163 lb (73.9 kg)    He has had a fall since being seen last, seen in the ER with negative xray.  Dressed here in the office and given keflex he is on 4 a day, still on it.     Past Medical History:  Diagnosis Date  . Arthritis   . CAD (coronary artery disease)   .  Hyperlipidemia   . Hypertension   . Old myocardial infarct   . S/P CABG x 5 08/29/1984   LIMA to LAD,SVG to diagonal,SVG to OM,SVG to PDA and PLA  . Tremor, essential      Allergies  Allergen Reactions  . Erythromycin Nausea Only    Current Outpatient Medications on File Prior to Visit  Medication Sig  . acetaminophen (TYLENOL) 325 MG tablet Take 650 mg by mouth every 6 (six) hours as needed.  [name] apixaban (ELIQUIS) 5 MG TABS tablet Take 1 tablet (5 mg total) by mouth 2 (two) times daily.  [name] atorvastatin (LIPITOR) 40 MG tablet Take 1 tablet daily for Cholesterol  . buPROPion (WELLBUTRIN XL) 150 MG 24 hr tablet Take 1 tablet (150 mg total) by  mouth every morning.  . cephALEXin (KEFLEX) 500 MG capsule Take 1 capsule 4 x/day after meals & bedtime for infection  . citalopram (CELEXA) 40 MG tablet Take 1 tablet (40 mg total) by mouth daily.  [name] co-enzyme Q-10 30 MG capsule Take 30 mg by mouth daily.   . diazepam (VALIUM) 5 MG tablet Take 1/2 to 1 tablet 2 to 3 x / day only if needed for Muscle spasms  & please try to limit to 5 days /week to avoid addiction  . Digestive Enzymes CAPS Take 1-2 capsules by mouth as needed (to aid digestive function).   . gabapentin (NEURONTIN) 100 MG capsule TAKE 1 CAPSULE BY MOUTH THREE TIMES DAILY FOR PAIN  . hydrochlorothiazide (HYDRODIURIL) 25 MG tablet TAKE 1 TABLET DAILY ONLY  IF  NEEDED  FOR  ANKLE  SWELLING  . losartan (COZAAR) 25 MG tablet Take 1 tablet (25 mg total) by mouth daily.  . Magnesium Chloride (MAGNESIUM DR PO) Take 2 tablets by mouth daily.   . metoprolol succinate (TOPROL-XL) 25 MG 24 hr tablet TAKE 1 TABLET BY MOUTH ONCE DAILY  . nitroGLYCERIN (NITROSTAT) 0.4 MG SL tablet Place 1 tablet (0.4 mg total) under the tongue every 5 (five) minutes as needed for chest pain.   No current facility-administered medications on file prior to visit.     ROS: all negative except above.   Physical Exam: Filed Weights   01/02/19 1403  Weight: 162 lb 3.2 oz (73.6 kg)   BP 108/60   Pulse 77   Temp 97.9 F (36.6 C)   Wt 162 lb 3.2 oz (73.6 kg)   SpO2 98%   BMI 26.18 kg/m  General Appearance: Well nourished, in no apparent distress. Eyes: PERRLA, conjunctiva no swelling or erythema ENT/Mouth:  Hearing normal.  Neck: Supple Respiratory: Respiratory effort normal, BS equal bilaterally without rales, rhonchi, wheezing or stridor.  Cardio: RRR with no MRGs. Brisk peripheral pulses without edema in lower extremities, pulses symmetrical upper extremities, generalized nonpitting edema to left dorsum of hand and forearm  Lymphatics: Non tender without lymphadenopathy.  Musculoskeletal: Full ROM  bil wrist, elbows, grips symmetrical, slow steady gait with cane Skin: Warm, dry; left arm with multiple skin tears after removal of dressing, approx 4 cm x 0.5 cm to distal radial forearm with serosanguinous drainage open superficial wound approx 2 cm x 8 cm inferior to elbow,  skin tear approx 3 cm with flap adhered and no drainage noted. Superficial open wound post/superior elbow approx 2.5 cm x 0.5 cm, wound base red with granulation tissue. Generalized arm is mildly edematous, with dependent ecchymosis, improved from previous image 4 days ago. Hematoma at proximal elbow and along ulnar side of forearm.  Neuro: Normal muscle tone,  Sensation intact. Fine resting tremor bilateral upper extremities.  Psych: Awake and oriented X 3, normal affect, Insight and Judgment appropriate.   Procedure: gently cleansed with NS, nonadherant dressing applied with keflex type wrap to hold in place from wrist to elbow.           Garnet Sierras, NP 2:30 PM [facility]

## 2018-12-29 NOTE — ED Provider Notes (Signed)
[facility] DEPT Provider Note   CSN: [id number] Arrival date & time: 12/29/18  2019    History   Chief Complaint Chief Complaint  Patient presents with  . Wound Check    HPI Paul Frazier. is a 85 y.o. male with past medical history of hyperlipidemia, hypertension, CAD, paroxysmal atrial flutter, on chronic anticoagulation, presenting to the emergency department for wound check of his left arm after mechanical fall that occurred on 12/23/2018.  He had negative imaging during this visit, was noted to have multiple skin tears to the left forearm.  He states he has been evaluated by his PCP every 2 days since the incident and has been having his wounds re-bandaged there.  He states they have not noted any concern with the wound appearance, however he reports today because his bandage loosened and he had a look at his wound and was concerned for the appearance.  He states he has been having some swelling to the left arm since the injury and his PCP has prescribed a medication for that.  No fevers or worsening symptoms.     The history is provided by the patient and medical records.    Past Medical History:  Diagnosis Date  . Arthritis   . CAD (coronary artery disease)   . Hyperlipidemia   . Hypertension   . Old myocardial infarct   . S/P CABG x 5 08/29/1984   LIMA to LAD,SVG to diagonal,SVG to OM,SVG to PDA and PLA  . Tremor, essential     Patient Active Problem List   Diagnosis Date Noted  . Chronic combined systolic and diastolic heart failure ([place]) 08/29/2018  . Pacemaker 08/29/2018  . TIA (transient ischemic attack) 08/29/2018  . Lumbar pain 06/09/2018  . Senile purpura ([place]) 05/11/2018  . Complete heart block ([place])   . Paroxysmal atrial flutter ([place]) 03/06/2018  . Atherosclerosis of aorta ([place]) 08/23/2016  . Basal cell carcinoma, ear 08/01/2015  . Major depression in full remission ([place]) 09/11/2014  . Essential hypertension 01/01/2014   . Vitamin D deficiency 01/01/2014  . Medication management 01/01/2014  . Other abnormal glucose 01/01/2014  . Nonsustained ventricular tachycardia ([place]) 05/23/2013  . CAD (coronary artery disease) 01/08/2013  . Cardiomyopathy, ischemic 01/08/2013  . Hyperlipidemia 01/08/2013  . LBBB (left bundle branch block) 01/08/2013    Past Surgical History:  Procedure Laterality Date  . A-FLUTTER ABLATION N/A 03/09/2018   Procedure: A-FLUTTER ABLATION;  Surgeon: Evans Lance, MD;  Location: [facility];  Service: Cardiovascular;  Laterality: N/A;  . CHOLECYSTECTOMY  10/1990  . CORONARY ARTERY BYPASS GRAFT  1986   x5 LIMA to LAD,SVG to diagonal,SVG to OM,SVG to PDA and PLA  . CORONARY STENT PLACEMENT    . INGUINAL HERNIA REPAIR  04/25/2012   right- laparoscopic  . PACEMAKER IMPLANT N/A 03/09/2018   Procedure: PACEMAKER IMPLANT;  Surgeon: Evans Lance, MD;  Location: [facility];  Service: Cardiovascular;  Laterality: N/A;  . R/P Myoview  12/27/2008   no ischemia  . TEE WITHOUT CARDIOVERSION N/A 03/09/2018   Procedure: TRANSESOPHAGEAL ECHOCARDIOGRAM (TEE);  Surgeon: Fay Records, MD;  Location: [facility] ENDOSCOPY;  Service: Cardiovascular;  Laterality: N/A;  Bubble Study  . US ECHOCARDIOGRAPHY  12/27/2008   concentric LVH,mild to mod MR, TR and pulmonary hypertension. EF 45-50%        Home Medications    Prior to Admission medications   Medication Sig Start Date End Date Taking? Authorizing  Provider  acetaminophen (TYLENOL) 325 MG tablet Take 650 mg by mouth every 6 (six) hours as needed.    [provider]  apixaban (ELIQUIS) 5 MG TABS tablet Take 1 tablet (5 mg total) by mouth 2 (two) times daily. 06/14/18   Croitoru, Mihai, MD  atorvastatin (LIPITOR) 40 MG tablet Take 1 tablet daily for Cholesterol 08/14/18   Unk Pinto, MD  buPROPion (WELLBUTRIN XL) 150 MG 24 hr tablet Take 1 tablet (150 mg total) by mouth every morning. 11/23/18 11/23/19  Vicie Mutters, PA-C   cephALEXin [facility]) 500 MG capsule Take 1 capsule 4 x/day after meals & bedtime for infection 12/28/18 01/04/19  Vicie Mutters, PA-C  citalopram (CELEXA) 40 MG tablet Take 1 tablet (40 mg total) by mouth daily. 12/28/18 12/28/19  Vicie Mutters, PA-C  co-enzyme Q-10 30 MG capsule Take 30 mg by mouth daily.     [provider]  diazepam (VALIUM) 5 MG tablet Take 1/2 to 1 tablet 2 to 3 x / day only if needed for Muscle spasms  & please try to limit to 5 days /week to avoid addiction 08/30/18   Unk Pinto, MD  Digestive Enzymes CAPS Take 1-2 capsules by mouth as needed (to aid digestive function).     [provider]  gabapentin (NEURONTIN) 100 MG capsule TAKE 1 CAPSULE BY MOUTH THREE TIMES DAILY FOR PAIN 12/14/18   Vicie Mutters, PA-C  hydrochlorothiazide (HYDRODIURIL) 25 MG tablet TAKE 1 TABLET DAILY ONLY  IF  NEEDED  FOR  ANKLE  SWELLING 11/13/18   Unk Pinto, MD  losartan (COZAAR) 25 MG tablet Take 1 tablet (25 mg total) by mouth daily. 03/10/18 03/10/19  Chanetta Marshall K, NP  Magnesium Chloride (MAGNESIUM DR PO) Take 2 tablets by mouth daily.     [provider]  metoprolol succinate (TOPROL-XL) 25 MG 24 hr tablet TAKE 1 TABLET BY MOUTH ONCE DAILY 06/02/18   Unk Pinto, MD  nitroGLYCERIN (NITROSTAT) 0.4 MG SL tablet Place 1 tablet (0.4 mg total) under the tongue every 5 (five) minutes as needed for chest pain. 08/16/17   Liane Comber, NP    Family History Family History  Problem Relation Age of Onset  . Cancer Father        bone  . Cancer Brother        lymphnode    Social History Social History   Tobacco Use  . Smoking status: Former Smoker    Quit date: 07/19/1970    Years since quitting: 48.4  . Smokeless tobacco: Never Used  Substance Use Topics  . Alcohol use: No    Alcohol/week: 0.0 standard drinks  . Drug use: No     Allergies   Erythromycin   Review of Systems Review of Systems  Musculoskeletal: Positive for myalgias.   Skin: Positive for wound.  Hematological: Bruises/bleeds easily.  All other systems reviewed and are negative.    Physical Exam Updated Vital Signs BP 123/62 (BP Location: Right Arm)   Pulse 84   Temp 98.1 F (36.7 C) (Oral)   Resp 17   SpO2 97%   Physical Exam Vitals signs and nursing note reviewed.  Constitutional:      General: He is not in acute distress.    Appearance: He is well-developed.  HENT:     Head: Normocephalic and atraumatic.  Eyes:     Conjunctiva/sclera: Conjunctivae normal.  Cardiovascular:     Rate and Rhythm: Normal rate.  Pulmonary:     Effort: Pulmonary effort  is normal.  Abdominal:     Palpations: Abdomen is soft.  Skin:    General: Skin is warm.     Comments: Left forearm with multiple skin tears that do not appear to be infected.  Wounds appear to be in normal stage of wound healing.  There is no purulent drainage, induration or surrounding erythema.  There is no tenderness noted.  The left forearm has diffuse edema present with large ecchymosis.  Intact distal pulses and sensation.  Neurological:     Mental Status: He is alert.  Psychiatric:        Behavior: Behavior normal.        ED Treatments / Results  Labs (all labs ordered are listed, but only abnormal results are displayed) Labs Reviewed - No data to display  EKG None  Radiology No results found.  Procedures Procedures (including critical care time)  Medications Ordered in ED Medications - No data to display   Initial Impression / Assessment and Plan / ED Course  I have reviewed the triage vital signs and the nursing notes.  Pertinent labs & imaging results that were available during my care of the patient were reviewed by me and considered in my medical decision making (see chart for details).        Presenting for wound check of in tears to his left forearm after mechanical fall on 12/23/2018.  He is been seeing his PCP every 2 days for wound recheck and dressing  changes.  He states the bandage fell off today and he got a look at the wound and was concerned for the appearance, however PCP has not expressed any concern.  He has been taking Keflex as prescribed.  On exam today, there is no evidence of infection; no purulence, induration or surrounding erythema or warmth.  There is generalized swelling and ecchymosis present, this is likely secondary to his chronic anticoagulation.  Encouraged elevation, ice as needed, and to continue wound checks by PCP.  Send with some bandages to change as needed if dressings become too moist.  Patient agreeable plan and safe for discharge.  Patient discussed with Dr. Johnney Killian.  Discussed results, findings, treatment and follow up. Patient advised of return precautions. Patient verbalized understanding and agreed with plan.   Final Clinical Impressions(s) / ED Diagnoses   Final diagnoses:  Visit for wound check    ED Discharge Orders    None       [place], [place] N, PA-C 12/29/18 2330    Charlesetta Shanks, MD 12/30/18 [phone]

## 2019-01-01 ENCOUNTER — Encounter (HOSPITAL_COMMUNITY)
Admission: RE | Admit: 2019-01-01 | Discharge: 2019-01-01 | Disposition: A | Discharge status: Home / Self Care | Payer: Medicare Other | Source: Ambulatory Visit | Attending: Urology | Admitting: Urology

## 2019-01-01 ENCOUNTER — Other Ambulatory Visit: Payer: Self-pay

## 2019-01-01 DIAGNOSIS — R972 Elevated prostate specific antigen [PSA]: Secondary | ICD-10-CM

## 2019-01-01 MED ORDER — TECHNETIUM TC 99M MEDRONATE IV KIT
20.9000 | PACK | Freq: Once | INTRAVENOUS | Status: AC | PRN
  Administered 2019-01-01: 20.9 via INTRAVENOUS

## 2019-01-02 ENCOUNTER — Ambulatory Visit (INDEPENDENT_AMBULATORY_CARE_PROVIDER_SITE_OTHER): Payer: Medicare Other | Admitting: Adult Health Nurse Practitioner

## 2019-01-02 VITALS — BP 108/60 | HR 77 | Temp 97.9°F | Wt 162.2 lb

## 2019-01-02 DIAGNOSIS — F3342 Major depressive disorder, recurrent, in full remission: Secondary | ICD-10-CM

## 2019-01-02 DIAGNOSIS — L089 Local infection of the skin and subcutaneous tissue, unspecified: Secondary | ICD-10-CM

## 2019-01-02 DIAGNOSIS — I255 Ischemic cardiomyopathy: Secondary | ICD-10-CM | POA: Diagnosis not present

## 2019-01-02 DIAGNOSIS — R634 Abnormal weight loss: Secondary | ICD-10-CM

## 2019-01-02 NOTE — Patient Instructions (Signed)
Change dressing on Thursday or Friday of this week.  Change it before this time if the dressing becomes saturated with drainage.  Follow up next week for wound check.  Continue antibiotics  Continue to elevated extremity when laying down.  Call or return with new or worsening symptoms.   Follow up early next week for wound check.

## 2019-01-04 ENCOUNTER — Encounter: Payer: Self-pay | Admitting: Adult Health Nurse Practitioner

## 2019-01-08 ENCOUNTER — Encounter: Payer: Self-pay | Admitting: Physician Assistant

## 2019-01-08 ENCOUNTER — Ambulatory Visit (INDEPENDENT_AMBULATORY_CARE_PROVIDER_SITE_OTHER): Payer: Medicare Other | Admitting: Physician Assistant

## 2019-01-08 ENCOUNTER — Telehealth: Payer: Self-pay | Admitting: *Deleted

## 2019-01-08 ENCOUNTER — Other Ambulatory Visit: Payer: Self-pay

## 2019-01-08 VITALS — BP 102/52 | HR 73 | Temp 97.2°F | Wt 164.8 lb

## 2019-01-08 DIAGNOSIS — R634 Abnormal weight loss: Secondary | ICD-10-CM

## 2019-01-08 DIAGNOSIS — M62838 Other muscle spasm: Secondary | ICD-10-CM

## 2019-01-08 DIAGNOSIS — F3342 Major depressive disorder, recurrent, in full remission: Secondary | ICD-10-CM | POA: Diagnosis not present

## 2019-01-08 DIAGNOSIS — L089 Local infection of the skin and subcutaneous tissue, unspecified: Secondary | ICD-10-CM | POA: Diagnosis not present

## 2019-01-08 DIAGNOSIS — I255 Ischemic cardiomyopathy: Secondary | ICD-10-CM | POA: Diagnosis not present

## 2019-01-08 MED ORDER — BUPROPION HCL ER (XL) 150 MG PO TB24: 150.0000 mg | ORAL_TABLET | ORAL | 2 refills | Status: DC

## 2019-01-08 MED ORDER — DIAZEPAM 5 MG PO TABS: ORAL_TABLET | ORAL | 0 refills | Status: DC

## 2019-01-08 NOTE — Telephone Encounter (Signed)
The patient has been called concerning his appointment on 01/15/2019 with Dr. Sallyanne Kuster. He stated that he prefers an office visit. He has been advised to come alone and to wear a mask.      COVID-19 Pre-Screening Questions:  . In the past 7 to 10 days have you had a cough,  shortness of breath, headache, congestion, fever (100 or greater) body aches, chills, sore throat, or sudden loss of taste or sense of smell? No . Have you been around anyone with known Covid 19. No . Have you been around anyone who is awaiting Covid 19 test results in the past 7 to 10 days? No . Have you been around anyone who has been exposed to Covid 19, or has mentioned symptoms of Covid 19 within the past 7 to 10 days? No  If you have any concerns/questions about symptoms patients report during screening (either on the phone or at threshold). Contact the provider seeing the patient or DOD for further guidance.  If neither are available contact a member of the leadership team.

## 2019-01-08 NOTE — Patient Instructions (Signed)
Skin Tear Care  A skin tear is a wound in which the top layers of skin have peeled off the deeper skin or tissues underneath them. This is a common problem as people get older because the skin becomes thinner and more fragile. In addition, some medicines, such as oral corticosteroids, can lead to skin thinning if they are taken for long periods of time.  A skin tear is often repaired with tape or skin adhesive strips. Depending on the location of the wound, a bandage (dressing) may be applied over the tape or skin adhesive strips.  Follow these instructions at home:  Wound care   Clean the wound as told by your health care provider. You may be instructed to keep the wound dry for the first few days. If you are told to clean the wound:  ? Wash the wound with mild soap and water or a salt-water (saline) solution.  ? Rinse the wound with water to remove all soap.  ? Do not rub the wound dry. Let the wound air dry.   Change any dressings as told by your health care provider. This includes changing the dressing if it gets wet, gets dirty, or starts to smell bad.   Do not scratch or pick at the wound.   Protect the injured area until it has healed.   Check your wound every day for signs of infection. Check for:  ? More redness, swelling, or pain.  ? More fluid or blood.  ? Warmth.  ? Pus or a bad smell.  Medicines     Take over-the-counter and prescription medicines only as told by your health care provider.   If you were prescribed an antibiotic medicine, take or apply it as told by your health care provider. Do not stop using the antibiotic even if your condition improves.  General instructions   Keep the dressing dry as told by your health care provider.   Do not take baths, swim, or do anything that puts your wound underwater until your health care provider approves.   Keep all follow-up visits as told by your health care provider. This is important.  Contact a health care provider if:   You have more  redness, swelling, or pain around your wound.   You have more fluid or blood coming from your wound.   Your wound feels warm to the touch.   You have pus or a bad smell coming from your wound.  Get help right away if:   You have a red streak that goes away from the skin tear.   You have a fever and chills and your symptoms suddenly get worse.  This information is not intended to replace advice given to you by your health care provider. Make sure you discuss any questions you have with your health care provider.  Document Released: 03/30/2001 Document Revised: 02/29/2016 Document Reviewed: 05/26/2015  Elsevier Interactive Patient Education  2019 [employer]

## 2019-01-08 NOTE — Progress Notes (Signed)
Assessment and Plan:  Multiple superficial wounds with infection Change dressing on today, follow up as needed - son is doing dressings at home - okay to bath, use gentle soap and water and pat dry the wound - elevate arm Call the office if any fever, chills, worsening redness/swelling up your arm, pain, any changes in symptoms.  Weight loss Improved cutting back on wellbutrin, continue celexa 40 mg -     citalopram (CELEXA) 40 MG tablet; Take 1 tablet (40 mg total) by mouth daily.  Recurrent major depressive disorder, in full remission ([place]) -     citalopram (CELEXA) 40 MG tablet; Take 1 tablet (40 mg total) by mouth daily. Weight has improved, patient states depression improved some, encouraged to seek counseling    Future Appointments  Date Time Provider [place]  01/15/2019 12:30 PM Croitoru, Dani Gobble, MD [facility]  03/12/2019  3:00 PM Unk Pinto, MD [facility] None  03/19/2019  7:30 AM [facility] DEVICE REMOTES [facility]  12/04/2019  3:45 PM Vicie Mutters, PA-C [facility] None    HPI 86 y.o.male presenting for wound check of in tears to his left forearm after mechanical fall on 12/23/2018.  He returned to the ER 06/12 where he was reassured.   He is here today for evaluation for wound that occured.  Reports he is doing well and his sone was able to help him change the dressing..  It has been 6 days since he has been seen in the office.  No breakthrough drainage noted on bandage.  Denies pain, numbness, tingling in left extremity.  He was switched from wellbutrin to celexa due to weight loss.Weight is up and stable at this time. Reports mild improvement with appetite today.  Denies night sweats, AB pain, SOB, CP.   He had CT AB that did not show any acute causes of weight loss.  last cologuard 2019, colonoscopy 2016, Remote history of smoking, CXR 02/2018 BMI is Body mass index is 26.6 kg/m., he is working on diet and exercise. Wt  Readings from Last 3 Encounters:  01/08/19 164 lb 12.8 oz (74.8 kg)  01/02/19 162 lb 3.2 oz (73.6 kg)  12/28/18 164 lb 3.2 oz (74.5 kg)    Past Medical History:  Diagnosis Date  . Arthritis   . CAD (coronary artery disease)   . Hyperlipidemia   . Hypertension   . Old myocardial infarct   . S/P CABG x 5 08/29/1984   LIMA to LAD,SVG to diagonal,SVG to OM,SVG to PDA and PLA  . Tremor, essential      Allergies  Allergen Reactions  . Erythromycin Nausea Only    Current Outpatient Medications on File Prior to Visit  Medication Sig  . acetaminophen (TYLENOL) 325 MG tablet Take 650 mg by mouth every 6 (six) hours as needed.  [name] apixaban (ELIQUIS) 5 MG TABS tablet Take 1 tablet (5 mg total) by mouth 2 (two) times daily.  [name] atorvastatin (LIPITOR) 40 MG tablet Take 1 tablet daily for Cholesterol  . buPROPion (WELLBUTRIN XL) 150 MG 24 hr tablet Take 1 tablet (150 mg total) by mouth every morning.  . citalopram (CELEXA) 40 MG tablet Take 1 tablet (40 mg total) by mouth daily.  [name] co-enzyme Q-10 30 MG capsule Take 30 mg by mouth daily.   . diazepam (VALIUM) 5 MG tablet Take 1/2 to 1 tablet 2 to 3 x / day only if needed for Muscle spasms  & please try to limit to 5 days /week to  avoid addiction  . Digestive Enzymes CAPS Take 1-2 capsules by mouth as needed (to aid digestive function).   . gabapentin (NEURONTIN) 100 MG capsule TAKE 1 CAPSULE BY MOUTH THREE TIMES DAILY FOR PAIN  . hydrochlorothiazide (HYDRODIURIL) 25 MG tablet TAKE 1 TABLET DAILY ONLY  IF  NEEDED  FOR  ANKLE  SWELLING  . losartan (COZAAR) 25 MG tablet Take 1 tablet (25 mg total) by mouth daily.  . Magnesium Chloride (MAGNESIUM DR PO) Take 2 tablets by mouth daily.   . metoprolol succinate (TOPROL-XL) 25 MG 24 hr tablet TAKE 1 TABLET BY MOUTH ONCE DAILY  . nitroGLYCERIN (NITROSTAT) 0.4 MG SL tablet Place 1 tablet (0.4 mg total) under the tongue every 5 (five) minutes as needed for chest pain.   No current facility-administered  medications on file prior to visit.     ROS: all negative except above.   Physical Exam: Filed Weights   01/08/19 1445  Weight: 164 lb 12.8 oz (74.8 kg)   BP (!) 102/52   Pulse 73   Temp (!) 97.2 F (36.2 C)   Wt 164 lb 12.8 oz (74.8 kg)   SpO2 98%   BMI 26.60 kg/m  General Appearance: Well nourished, in no apparent distress. Eyes: PERRLA, conjunctiva no swelling or erythema ENT/Mouth:  Hearing normal.  Neck: Supple Respiratory: Respiratory effort normal, BS equal bilaterally without rales, rhonchi, wheezing or stridor.  Cardio: RRR with no MRGs. Brisk peripheral pulses without edema in lower extremities, pulses symmetrical upper extremities, without edema.  Lymphatics: Non tender without lymphadenopathy.  Musculoskeletal: Full ROM bilateral wrist, elbows, grips symmetrical, slow steady gait with cane Skin: well healing skin tears on left arm, no discharge,no erythema, hematoma has resolved. Proximal forearm with 2cm x 1 cm adhered flap, 2cm x 0.5cm superficial bleeding skin tear superior to elbow.   Neuro: Normal muscle tone,  Sensation intact. Fine resting tremor bilateral upper extremities.  Psych: Awake and oriented X 3, normal affect, Insight and Judgment appropriate.   Procedure: gently cleansed with NS, nonadherant dressing applied with keflex type wrap to hold in place from wrist to elbow.         Garnet Sierras, NP 2:30 PM [facility]

## 2019-01-12 ENCOUNTER — Telehealth: Payer: Self-pay | Admitting: Cardiovascular Disease

## 2019-01-12 NOTE — Telephone Encounter (Signed)

## 2019-01-15 ENCOUNTER — Encounter: Payer: Self-pay | Admitting: Cardiovascular Disease

## 2019-01-15 ENCOUNTER — Encounter: Payer: Medicare Other | Admitting: Cardiovascular Disease

## 2019-01-15 ENCOUNTER — Ambulatory Visit (INDEPENDENT_AMBULATORY_CARE_PROVIDER_SITE_OTHER): Payer: Medicare Other | Admitting: Cardiovascular Disease

## 2019-01-15 VITALS — BP 110/42 | HR 84 | Temp 98.1°F | Ht 66.0 in | Wt 162.0 lb

## 2019-01-15 DIAGNOSIS — I4892 Unspecified atrial flutter: Secondary | ICD-10-CM | POA: Diagnosis not present

## 2019-01-15 DIAGNOSIS — I700 Atherosclerosis of aorta: Secondary | ICD-10-CM

## 2019-01-15 DIAGNOSIS — I10 Essential (primary) hypertension: Secondary | ICD-10-CM | POA: Diagnosis not present

## 2019-01-15 DIAGNOSIS — E782 Mixed hyperlipidemia: Secondary | ICD-10-CM | POA: Diagnosis not present

## 2019-01-15 DIAGNOSIS — I447 Left bundle-branch block, unspecified: Secondary | ICD-10-CM

## 2019-01-15 DIAGNOSIS — I255 Ischemic cardiomyopathy: Secondary | ICD-10-CM | POA: Diagnosis not present

## 2019-01-15 DIAGNOSIS — I25118 Atherosclerotic heart disease of native coronary artery with other forms of angina pectoris: Secondary | ICD-10-CM | POA: Diagnosis not present

## 2019-01-15 DIAGNOSIS — Z950 Presence of cardiac pacemaker: Secondary | ICD-10-CM

## 2019-01-15 DIAGNOSIS — I5042 Chronic combined systolic (congestive) and diastolic (congestive) heart failure: Secondary | ICD-10-CM

## 2019-01-15 MED ORDER — LOSARTAN POTASSIUM 25 MG PO TABS: 12.5000 mg | ORAL_TABLET | Freq: Every day | ORAL | 11 refills | Status: DC

## 2019-01-15 NOTE — Patient Instructions (Addendum)
Medication Instructions:  DECREASE the Losartan to 12.5 mg ( half of the 25 mg tablet) once daily  If you need a refill on your cardiac medications before your next appointment, please call your pharmacy.   Lab work: None ordered  Testing/Procedures: None ordered  Follow-Up: At [facility], you and your health needs are our priority.  As part of our continuing mission to provide you with exceptional heart care, we have created designated Provider Care Teams.  These Care Teams include your primary Cardiologist (physician) and Advanced Practice Providers (APPs -  Physician Assistants and Nurse Practitioners) who all work together to provide you with the care you need, when you need it. You will need a follow up appointment in 12 months.  Please call our office 2 months in advance to schedule this appointment.  You may see Sanda Klein, MD or one of the following Advanced Practice Providers on your designated Care Team: Almyra Deforest, PA-C Fabian Sharp, [place]

## 2019-01-15 NOTE — Progress Notes (Signed)
Patient ID: Paul Nap., male   DOB: September 24, 1932, 85 y.o.   MRN: [id number]      Cardiology Office Note   Date:  01/15/2019   ID:  Paul Nap., DOB [birth year], MRN [id number]  PCP:  Unk Pinto, MD  Cardiologist:   Sanda Klein, MD   No chief complaint on file. Pacemaker check, atrial flutter ablation, falls    History of Present Illness: Kemp Gomes. is a 85 y.o. male who presents for follow-up on multiple cardiac problems.  His wife [name] is also my patient, but has been unable to follow-up in a long time.    Gentle is actually doing quite well from a cardiac point of view, but is clearly under a lot of emotional stress, ever since Janice's stroke.  She has a lot of problems with back and joint pain and difficulty communicating with due to a aphasia.  Requires a lot of attention and he finds it harder and harder to take care of her.  He is considering relocating to assisted living, but is depressed since he will miss working in the yard and in his wood shop.  He has not had any problems with angina pectoris although he only takes a low-dose of beta-blocker as his only antianginal.  He has been able to help his son repair the wooden steps of their deck, including carrying some planks, without having angina or dyspnea.  He has not required diuretics for edema in months.  He has not had any new episodes that would be suggestive of TIA or stroke.  His balance on a ramp and fell backwards injuring his arm.  He had quite a bit of bleeding, but did not require surgery or stitches.  He has a history of coronary artery disease and previous bypass surgery with subsequent graft failure and myocardial infarction, moderate to severely depressed left ventricular systolic function with EF around 30% and well compensated heart failure, nonsustained ventricular tachycardia, atrial flutter status post radiofrequency ablation, paroxysmal atrial fibrillation, left bundle branch block,  second-degree atrioventricular block, pacemaker with septal His bundle ventricular lead (March 09, 2018, Dr. Lovena Le, Medtronic Azure), history of subdural hematoma in 2013.  His pacemaker interrogation from June 2 shows several brief episodes of paroxysmal atrial fibrillation, the longest being only 1 hour in duration.  The overall burden of atrial fibrillation is well under 1%.  He has 92% atrial pacing and 99% ventricular pacing.  Did not perform a manual check today, but last time he was seen in the office he had complete heart block with an idioventricular escape rhythm in the 30s.    His heart rate histogram appears substantially improved compared with his last office check, suggesting better sensor settings.  The patient specifically denies any chest pain at rest exertion, dyspnea at rest or with exertion, orthopnea, paroxysmal nocturnal dyspnea, syncope, palpitations, focal neurological deficits, intermittent claudication, lower extremity edema, unexplained weight gain, cough, hemoptysis or wheezing.  He had atrial flutter ablation, followed by development of complete heart block.  He underwent implantation of a dual-chamber permanent pacemaker with a His bundle septal ventricular lead on March 09, 2018.   Ventricular outputs were reduced to chronic settings today.  His estimated generator longevity after these changes is now 8.8 years.  Prior to his RF ablation he underwent a transesophageal echo on August 21 which showed left ventricular ejection fraction of 30-35% with inferior and inferoseptal akinesis and the mildly dilated left atrium, mild mitral regurgitation,  moderate tricuspid regurgitation.  A nuclear stress test performed on April 12, 2018 shows a similar pattern of LVEF 30% with inferior scar, incomplete reversible ischemia.  He has a complicated history of coronary disease and underwent bypass surgery in 1986. He has total occlusion of his LAD artery and his dominant left  circumflex coronary artery is graft dependent. He presented with a moderate size non-ST segment elevation myocardial infarction in the setting of paroxysmal supraventricular tachycardia in October 2012 and was treated in [place], [place]. The catheterization report is a little confusing since it describes grafts with different locations than those described at the time of his bypass procedure, however it appears that he has lost the sequential bypass to the oblique marginal artery. A more recent nuclear stress test (May 2014) showed that his left ventricular ejection fraction was down to 42% with inferolateral and anterolateral scar and hypokinesia but without any reversible ischemic abnormalities. I offered cardiac catheterization at that time since I was worried that he had developed disease in the sequential saphenous vein graft to the PDA/PLA, but he preferred conservative management.  A nuclear stress test in September 2019 shows basal-mid inferior, mostly fixed defect, EF 30%.  He underwent radiofrequency ablation for atrial flutter in August 2458, complicated by complete heart block and leading to implantation of a dual-chamber permanent pacemaker with a His bundle lead ([employer] Azure).  He had long-standing left bundle branch block before that.   Past Medical History:  Diagnosis Date  . Arthritis   . CAD (coronary artery disease)   . Hyperlipidemia   . Hypertension   . Old myocardial infarct   . S/P CABG x 5 08/29/1984   LIMA to LAD,SVG to diagonal,SVG to OM,SVG to PDA and PLA  . Tremor, essential     Past Surgical History:  Procedure Laterality Date  . A-FLUTTER ABLATION N/A 03/09/2018   Procedure: A-FLUTTER ABLATION;  Surgeon: Evans Lance, MD;  Location: [place] [facility];  Service: Cardiovascular;  Laterality: N/A;  . CHOLECYSTECTOMY  10/1990  . CORONARY ARTERY BYPASS GRAFT  1986   x5 LIMA to LAD,SVG to diagonal,SVG to OM,SVG to PDA and PLA  . CORONARY STENT PLACEMENT     . INGUINAL HERNIA REPAIR  04/25/2012   right- laparoscopic  . PACEMAKER IMPLANT N/A 03/09/2018   Procedure: PACEMAKER IMPLANT;  Surgeon: Evans Lance, MD;  Location: [place];  Service: Cardiovascular;  Laterality: N/A;  . R/P Myoview  12/27/2008   no ischemia  . TEE WITHOUT CARDIOVERSION N/A 03/09/2018   Procedure: TRANSESOPHAGEAL ECHOCARDIOGRAM (TEE);  Surgeon: Fay Records, MD;  Location: [facility];  Service: Cardiovascular;  Laterality: N/A;  Bubble Study  . US ECHOCARDIOGRAPHY  12/27/2008   concentric LVH,mild to mod MR, TR and pulmonary hypertension. EF 45-50%     Current Outpatient Medications  Medication Sig Dispense Refill  . acetaminophen (TYLENOL) 325 MG tablet Take 650 mg by mouth every 6 (six) hours as needed.    [name] apixaban (ELIQUIS) 5 MG TABS tablet Take 1 tablet (5 mg total) by mouth 2 (two) times daily. 180 tablet 3  . atorvastatin (LIPITOR) 40 MG tablet Take 1 tablet daily for Cholesterol 90 tablet 3  . buPROPion (WELLBUTRIN XL) 150 MG 24 hr tablet Take 1 tablet (150 mg total) by mouth every morning. 30 tablet 2  . citalopram (CELEXA) 40 MG tablet Take 1 tablet (40 mg total) by mouth daily. 90 tablet 2  . co-enzyme Q-10 30 MG capsule  Take 30 mg by mouth daily.     . diazepam (VALIUM) 5 MG tablet Take 1/2 to 1 tablet 2 to 3 x / day only if needed for Muscle spasms  & please try to limit to 5 days /week to avoid addiction 90 tablet 0  . Digestive Enzymes CAPS Take 1-2 capsules by mouth as needed (to aid digestive function).     . gabapentin (NEURONTIN) 100 MG capsule TAKE 1 CAPSULE BY MOUTH THREE TIMES DAILY FOR PAIN 90 capsule 0  . hydrochlorothiazide (HYDRODIURIL) 25 MG tablet TAKE 1 TABLET DAILY ONLY  IF  NEEDED  FOR  ANKLE  SWELLING 90 tablet 1  . losartan (COZAAR) 25 MG tablet Take 0.5 tablets (12.5 mg total) by mouth daily. 30 tablet 11  . Magnesium Chloride (MAGNESIUM DR PO) Take 2 tablets by mouth daily.     . metoprolol succinate (TOPROL-XL) 25 MG 24  hr tablet TAKE 1 TABLET BY MOUTH ONCE DAILY 90 tablet 1  . nitroGLYCERIN (NITROSTAT) 0.4 MG SL tablet Place 1 tablet (0.4 mg total) under the tongue every 5 (five) minutes as needed for chest pain. 25 tablet 3   No current facility-administered medications for this visit.     Allergies:   Erythromycin    Social History:  The patient  reports that he quit smoking about 48 years ago. He has never used smokeless tobacco. He reports that he does not drink alcohol or use drugs.   Family History:  The patient's family history includes Cancer in his brother and father.    ROS:  Please see the history of present illness.    Otherwise, review of systems positive for none.   All other systems are reviewed and negative.    PHYSICAL EXAM: VS:  BP (!) 110/42   Pulse 84   Temp 98.1 F (36.7 C)   Ht 5\' 6"  (1.676 m)   Wt 162 lb (73.5 kg)   SpO2 94%   BMI 26.15 kg/m  , BMI Body mass index is 26.15 kg/m.   General: Alert, oriented x3, no distress, appears well Head: no evidence of trauma, PERRL, EOMI, no exophtalmos or lid lag, no myxedema, no xanthelasma; normal ears, nose and oropharynx Neck: normal jugular venous pulsations and no hepatojugular reflux; brisk carotid pulses without delay and no carotid bruits Chest: clear to auscultation, no signs of consolidation by percussion or palpation, normal fremitus, symmetrical and full respiratory excursions Cardiovascular: normal position and quality of the apical impulse, regular rhythm, normal first and second heart sounds, no murmurs, rubs or gallops Abdomen: no tenderness or distention, no masses by palpation, no abnormal pulsatility or arterial bruits, normal bowel sounds, no hepatosplenomegaly Extremities: Scars left forearm,  no clubbing, cyanosis or edema; 2+ radial, ulnar and brachial pulses bilaterally; 2+ right femoral, posterior tibial and dorsalis pedis pulses; 2+ left femoral, posterior tibial and dorsalis pedis pulses; no subclavian or  femoral bruits Neurological: grossly nonfocal Psych: Normal mood and affect    EKG:  EKG is not ordered today.  ECG February 2020 atrial paced, ventricular paced rhythm, there is a prominent positive R waves in lead V2 consistent with effective His bundle pacing, QRS is however broad at 154 ms (he had pre-existing left bundle branch block), QTC 488 ms.  He has chronic ST-T wave changes in leads V5-V6, 2, 3, aVF.  Recent Labs: 11/23/2018: ALT 28; BUN 17; Creat 1.05; Hemoglobin 15.6; Magnesium 1.7; Platelets 192; Potassium 4.1; Sodium 139; TSH 0.38    Lipid  Panel    Component Value Date/Time   CHOL 132 11/23/2018 1600   CHOL 117 08/21/2018 0943   TRIG 108 11/23/2018 1600   HDL 54 11/23/2018 1600   HDL 54 08/21/2018 0943   CHOLHDL 2.4 11/23/2018 1600   VLDL 12 03/07/2018 0459   LDLCALC 59 11/23/2018 1600      Wt Readings from Last 3 Encounters:  01/15/19 162 lb (73.5 kg)  01/08/19 164 lb 12.8 oz (74.8 kg)  01/02/19 162 lb 3.2 oz (73.6 kg)      ASSESSMENT AND PLAN:  No diagnosis found.  1. CHF: Clinically euvolemic without diuretics, NYHA functional class I-2, unable to tolerate higher doses of heart failure medications.  In fact his diastolic blood pressure is quite low at 42 and I recommended decreasing the losartan to only 12.5 mg once daily.  I am not sure to what degree his falls are related to hypotension.   3. CAD s/p CABG: In the past used to develop angina pectoris during cold weather and physical activity, but this year he has not had angina at all.  Currently CCS class 1. Most recent imaging studies suggest that he may indeed have completely lost vascular supply to the inferior wall due to occlusion of the SVG to the PDA/PLA.   4. AFlutter: On Eliquis.  No true atrial flutter seen since his ablation. The burden of atrial fibrillation is low.  With a history of falls and previous subdural hematoma, the threshold for discontinuing anticoagulation remains low, but he is done  well in the last few months.  If he does decide to move to an assisted living or nursing based facility, I would feel even more comfortable with continue with anticoagulation. 5. HLP: Excellent lipid profile on current medications. 6. 2nd/3rd deg AVB: Intermittent complete heart block since his flutter ablation.  He requires virtually 100% ventricular pacing.  Thankfully this does not appear to have led to heart failure decompensation, maybe because he has a His lead. 7. PPM:  septal His bundle ventricular lead.  Normal device function. Remote downloads every 3 months and yearly office visits. 8.  Occluded right vertebral artery: Chronic, unlikely to explain his recent neurological complaints. 9.  Biggest problem right now remains his social/emotional issues.  I agree with his decision to explore assisted living.  It is possible that his wife [name] will soon require an even higher level of care.  Made referral to a Education officer, museum.     No orders of the defined types were placed in this encounter.   Patient Instructions  Medication Instructions:  DECREASE the Losartan to 12.5 mg ( half of the 25 mg tablet) once daily  If you need a refill on your cardiac medications before your next appointment, please call your pharmacy.   Lab work: None ordered  Testing/Procedures: None ordered  Follow-Up: At [facility], you and your health needs are our priority.  As part of our continuing mission to provide you with exceptional heart care, we have created designated Provider Care Teams.  These Care Teams include your primary Cardiologist (physician) and Advanced Practice Providers (APPs -  Physician Assistants and Nurse Practitioners) who all work together to provide you with the care you need, when you need it. You will need a follow up appointment in 12 months.  Please call our office 2 months in advance to schedule this appointment.  You may see Sanda Klein, MD or one of the following Advanced  Practice Providers on your designated  Care Team: Almyra Deforest, PA-C Fabian Sharp, PA-C       Signed, Sanda Klein, MD  01/15/2019 3:07 PM    Sanda Klein, MD, [facility] [phone] office [phone] pager

## 2019-01-26 ENCOUNTER — Telehealth: Payer: Self-pay

## 2019-01-26 NOTE — Telephone Encounter (Signed)
Called pt to follow up on crr referral. Pt mentioned that it was actually his ife, Paul Frazier, who needs assistance with ADLs. Only needed a few times a week for a few hours. She is currently getting pt from [facility], has Medicare/ [employer]. Would like for someone to come out and explain

## 2019-02-12 ENCOUNTER — Other Ambulatory Visit: Payer: Self-pay | Admitting: Physician Assistant

## 2019-02-12 DIAGNOSIS — G8929 Other chronic pain: Secondary | ICD-10-CM

## 2019-03-06 ENCOUNTER — Telehealth: Payer: Self-pay | Admitting: Cardiovascular Disease

## 2019-03-06 NOTE — Telephone Encounter (Signed)
New Message      Pt is having dental work on Sept. 2nd 2020 and is needing to know about stopping the Eliquis     Please call

## 2019-03-06 NOTE — Telephone Encounter (Signed)
No, we do not hold anticoagulation for 1-2 extractions

## 2019-03-06 NOTE — Telephone Encounter (Signed)
The patient stated that he is having a tooth extraction on Sept 2nd and would like to know if he needs to hold Eliquis.

## 2019-03-07 NOTE — Telephone Encounter (Signed)
The patient has been made aware and verbalized his understanding.  

## 2019-03-07 NOTE — Telephone Encounter (Signed)
Left a message for the patient to call back.  

## 2019-03-12 ENCOUNTER — Ambulatory Visit: Payer: Self-pay | Admitting: Internal Medicine

## 2019-03-12 NOTE — Progress Notes (Signed)
                                                                                                                                                             R  E  S  C  H  E  D  U  L  E D                                 This very nice 85 y.o. MWM presents for a  comprehensive evaluation and management of multiple medical co-morbidities.  Patient has been followed for HTN, HLD, T2_NIDDM  Prediabetes and Vitamin D Deficiency.     HTN predates since     . Patient's BP has been controlled at home.  Today's  . Patient denies any cardiac symptoms as chest pain, palpitations, shortness of breath, dizziness or ankle swelling.     Patient's hyperlipidemia is controlled with diet and medications. Patient denies myalgias or other medication SE's. Last lipids were  Lab Results  Component Value Date   CHOL 132 11/23/2018   HDL 54 11/23/2018   LDLCALC 59 11/23/2018   TRIG 108 11/23/2018   CHOLHDL 2.4 11/23/2018      Patient has hx/o T2_NIDDM prediabetes since    and patient denies reactive hypoglycemic symptoms, visual blurring, diabetic polys or paresthesias. Last A1c was  Lab Results  Component Value Date   HGBA1C 5.9 (H) 11/23/2018       Finally, patient has history of Vitamin D Deficiency of    and last vitamin D was  Lab Results  Component Value Date   VD25OH 65 08/14/2018

## 2019-03-12 NOTE — Progress Notes (Signed)
R E S C H E D U L E D                                                                                                                                                                                                                                                                                              This very nice 85 y.o. MWM presents for a  comprehensive evaluation and management of multiple medical co-morbidities.  Patient has been followed for HTN, HLD, T2_NIDDM  Prediabetes and Vitamin D Deficiency.                                         HTN predates since     . Patient's BP has been controlled at home.  Today's  . Patient denies any cardiac symptoms as chest pain, palpitations, shortness of breath, dizziness or ankle swelling.                                         Patient's hyperlipidemia is controlled with diet and medications. Patient denies myalgias or other medication SE's. Last lipids were  Recent Labs       Lab Results  Component Value Date   CHOL 132 11/23/2018   HDL 54 11/23/2018   LDLCALC 59 11/23/2018   TRIG 108 11/23/2018   CHOLHDL 2.4 11/23/2018                                            Patient has hx/o T2_NIDDM prediabetes since    and patient denies reactive hypoglycemic symptoms, visual blurring, diabetic polys or paresthesias. Last A1c was  Recent Labs       Lab Results  Component Value Date   HGBA1C 5.9 (H) 11/23/2018  Finally, patient has history of Vitamin D Deficiency of    and last vitamin D was  Recent Labs       Lab Results  Component Value Date   VD25OH 65 08/14/2018

## 2019-03-16 ENCOUNTER — Telehealth: Payer: Self-pay

## 2019-03-16 NOTE — Telephone Encounter (Signed)
Pt was making sure he did not have to come to the office for his home remote check. I let the pt know he do not need to come to the office. All he has to do is sleep by his monitor and his transmission will come thru. The pt verbalized understanding and thanked me for clarifying the appointment type.

## 2019-03-19 ENCOUNTER — Ambulatory Visit (INDEPENDENT_AMBULATORY_CARE_PROVIDER_SITE_OTHER): Payer: Medicare Other | Admitting: *Deleted

## 2019-03-19 DIAGNOSIS — I442 Atrioventricular block, complete: Secondary | ICD-10-CM | POA: Diagnosis not present

## 2019-03-19 LAB — CUP PACEART REMOTE DEVICE CHECK
Battery Remaining Longevity: 94 mo
Battery Voltage: 3 V
Brady Statistic AP VP Percent: 90.43 %
Brady Statistic AP VS Percent: 0.11 %
Brady Statistic AS VP Percent: 8.52 %
Brady Statistic AS VS Percent: 0.93 %
Brady Statistic RA Percent Paced: 91.32 %
Brady Statistic RV Percent Paced: 98.96 %
Date Time Interrogation Session: 20200831155226
Implantable Lead Implant Date: 20190822
Implantable Lead Implant Date: 20190822
Implantable Lead Location: 753859
Implantable Lead Location: 753860
Implantable Lead Model: 3830
Implantable Lead Model: 5076
Implantable Pulse Generator Implant Date: 20190822
Lead Channel Impedance Value: 342 Ohm
Lead Channel Impedance Value: 399 Ohm
Lead Channel Impedance Value: 456 Ohm
Lead Channel Impedance Value: 475 Ohm
Lead Channel Pacing Threshold Amplitude: 0.625 V
Lead Channel Pacing Threshold Amplitude: 1 V
Lead Channel Pacing Threshold Pulse Width: 0.4 ms
Lead Channel Pacing Threshold Pulse Width: 0.4 ms
Lead Channel Sensing Intrinsic Amplitude: 5.375 mV
Lead Channel Sensing Intrinsic Amplitude: 5.375 mV
Lead Channel Sensing Intrinsic Amplitude: 8.5 mV
Lead Channel Sensing Intrinsic Amplitude: 8.5 mV
Lead Channel Setting Pacing Amplitude: 1.5 V
Lead Channel Setting Pacing Amplitude: 2.5 V
Lead Channel Setting Pacing Pulse Width: 1 ms
Lead Channel Setting Sensing Sensitivity: 2.8 mV

## 2019-03-20 DIAGNOSIS — Z23 Encounter for immunization: Secondary | ICD-10-CM | POA: Diagnosis not present

## 2019-03-28 ENCOUNTER — Encounter: Payer: Self-pay | Admitting: Cardiology

## 2019-03-28 NOTE — Progress Notes (Signed)
Remote pacemaker transmission.   

## 2019-04-18 ENCOUNTER — Other Ambulatory Visit: Payer: Self-pay | Admitting: Internal Medicine

## 2019-04-18 DIAGNOSIS — I10 Essential (primary) hypertension: Secondary | ICD-10-CM

## 2019-04-26 ENCOUNTER — Other Ambulatory Visit: Payer: Self-pay

## 2019-04-26 ENCOUNTER — Ambulatory Visit (INDEPENDENT_AMBULATORY_CARE_PROVIDER_SITE_OTHER): Payer: Medicare Other | Admitting: Internal Medicine

## 2019-04-26 VITALS — BP 132/64 | HR 71 | Temp 97.2°F | Resp 16 | Ht 66.0 in | Wt 157.6 lb

## 2019-04-26 DIAGNOSIS — I5042 Chronic combined systolic (congestive) and diastolic (congestive) heart failure: Secondary | ICD-10-CM

## 2019-04-26 DIAGNOSIS — Z87891 Personal history of nicotine dependence: Secondary | ICD-10-CM

## 2019-04-26 DIAGNOSIS — Z125 Encounter for screening for malignant neoplasm of prostate: Secondary | ICD-10-CM

## 2019-04-26 DIAGNOSIS — Z8249 Family history of ischemic heart disease and other diseases of the circulatory system: Secondary | ICD-10-CM | POA: Diagnosis not present

## 2019-04-26 DIAGNOSIS — R7309 Other abnormal glucose: Secondary | ICD-10-CM

## 2019-04-26 DIAGNOSIS — I4892 Unspecified atrial flutter: Secondary | ICD-10-CM

## 2019-04-26 DIAGNOSIS — I25118 Atherosclerotic heart disease of native coronary artery with other forms of angina pectoris: Secondary | ICD-10-CM

## 2019-04-26 DIAGNOSIS — N401 Enlarged prostate with lower urinary tract symptoms: Secondary | ICD-10-CM | POA: Diagnosis not present

## 2019-04-26 DIAGNOSIS — Z1211 Encounter for screening for malignant neoplasm of colon: Secondary | ICD-10-CM

## 2019-04-26 DIAGNOSIS — N138 Other obstructive and reflux uropathy: Secondary | ICD-10-CM | POA: Diagnosis not present

## 2019-04-26 DIAGNOSIS — I255 Ischemic cardiomyopathy: Secondary | ICD-10-CM | POA: Diagnosis not present

## 2019-04-26 DIAGNOSIS — Z136 Encounter for screening for cardiovascular disorders: Secondary | ICD-10-CM

## 2019-04-26 DIAGNOSIS — I700 Atherosclerosis of aorta: Secondary | ICD-10-CM

## 2019-04-26 DIAGNOSIS — R7303 Prediabetes: Secondary | ICD-10-CM

## 2019-04-26 DIAGNOSIS — Z79899 Other long term (current) drug therapy: Secondary | ICD-10-CM | POA: Diagnosis not present

## 2019-04-26 DIAGNOSIS — Z950 Presence of cardiac pacemaker: Secondary | ICD-10-CM

## 2019-04-26 DIAGNOSIS — I10 Essential (primary) hypertension: Secondary | ICD-10-CM

## 2019-04-26 DIAGNOSIS — E782 Mixed hyperlipidemia: Secondary | ICD-10-CM | POA: Diagnosis not present

## 2019-04-26 DIAGNOSIS — E559 Vitamin D deficiency, unspecified: Secondary | ICD-10-CM

## 2019-04-26 NOTE — Patient Instructions (Signed)

## 2019-04-26 NOTE — Progress Notes (Signed)
Comprehensive Evaluation & Examination     This very nice 85 y.o. MWM presents for a  comprehensive evaluation and management of multiple medical co-morbidities.  Patient has been followed for HTN, ASHD, HLD, Prediabetes and Vitamin D Deficiency.     HTN predates circa 1980's . Patient's BP has been controlled at home.  Today's BP is at goal - 132/64. In 1986, patient underwent a CABG and then stenting in 2001.  Heart Cath in 2012 was negative. Patient denies any cardiac symptoms as chest pain, palpitations, shortness of breath, dizziness or ankle swelling.     Patient's hyperlipidemia is controlled with diet and medications. Patient denies myalgias or other medication SE's. Last lipids were at goal albeit slightly elevated Trig's:  Lab Results  Component Value Date   CHOL 141 04/26/2019   HDL 48 04/26/2019   LDLCALC 67 04/26/2019   TRIG 183 (H) 04/26/2019   CHOLHDL 2.9 04/26/2019      Patient has hx/o prediabetes (A1c 5.8% / 2013) and patient denies reactive hypoglycemic symptoms, visual blurring, diabetic polys or paresthesias. Last A1c was near goal:  Lab Results  Component Value Date   HGBA1C 5.9 (H) 04/26/2019       Finally, patient has history of Vitamin D Deficiency "(40" / 2011) and last vitamin D was at goal:  Lab Results  Component Value Date   VD25OH 61 04/26/2019   Current Outpatient Medications on File Prior to Visit  Medication Sig  . acetaminophen (TYLENOL) 325 MG tablet Take 650 mg by mouth every 6 (six) hours as needed.  [name] apixaban (ELIQUIS) 5 MG TABS tablet Take 1 tablet (5 mg total) by mouth 2 (two) times daily.  [name] atorvastatin (LIPITOR) 40 MG tablet Take 1 tablet daily for Cholesterol  . buPROPion (WELLBUTRIN XL) 150 MG 24 hr tablet Take 1 tablet (150 mg total) by mouth every morning.  . citalopram (CELEXA) 40 MG tablet Take 1 tablet (40 mg total) by mouth daily.  [name] co-enzyme Q-10 30 MG capsule Take 30 mg by mouth daily.   . diazepam (VALIUM) 5 MG tablet  Take 1/2 to 1 tablet 2 to 3 x / day only if needed for Muscle spasms  & please try to limit to 5 days /week to avoid addiction  . Digestive Enzymes CAPS Take 1-2 capsules by mouth as needed (to aid digestive function).   . gabapentin (NEURONTIN) 100 MG capsule Take 1 capsule 3 x /day as needed for Pain  . hydrochlorothiazide (HYDRODIURIL) 25 MG tablet TAKE 1 TABLET DAILY ONLY  IF  NEEDED  FOR  ANKLE  SWELLING  . losartan (COZAAR) 25 MG tablet Take 0.5 tablets (12.5 mg total) by mouth daily. (Patient taking differently: Take 25 mg by mouth daily. )  . Magnesium Chloride (MAGNESIUM DR PO) Take 2 tablets by mouth daily.   . metoprolol succinate (TOPROL-XL) 25 MG 24 hr tablet Take 1 tablet Daily for BP & Heart  . nitroGLYCERIN (NITROSTAT) 0.4 MG SL tablet Place 1 tablet (0.4 mg total) under the tongue every 5 (five) minutes as needed for chest pain.   No current facility-administered medications on file prior to visit.    Allergies  Allergen Reactions  . Erythromycin Nausea Only   Past Medical History:  Diagnosis Date  . Arthritis   . CAD (coronary artery disease)   . Hyperlipidemia   . Hypertension   . Old myocardial infarct   . S/P CABG x 5 08/29/1984   LIMA to LAD,SVG  to diagonal,SVG to OM,SVG to PDA and PLA  . Tremor, essential    Health Maintenance  Topic Date Due  . TETANUS/TDAP  07/19/2021  . INFLUENZA VACCINE  Completed  . PNA vac Low Risk Adult  Completed   Immunization History  Administered Date(s) Administered  . Influenza, High Dose Seasonal PF 05/14/2016, 06/08/2017, 05/11/2018  . Influenza-Unspecified 03/27/2019  . Pneumococcal Conjugate-13 05/11/2018  . Pneumococcal Polysaccharide-23 10/16/2014  . Td 07/20/2011   Cologard - 02/24/2018 - Negative - recc 3 yr f/u due Aug 2022.  Past Surgical History:  Procedure Laterality Date  . A-FLUTTER ABLATION N/A 03/09/2018   Procedure: A-FLUTTER ABLATION;  Surgeon: Evans Lance, MD;  Location: [facility];   Service: Cardiovascular;  Laterality: N/A;  . CHOLECYSTECTOMY  10/1990  . CORONARY ARTERY BYPASS GRAFT  1986   x5 LIMA to LAD,SVG to diagonal,SVG to OM,SVG to PDA and PLA  . CORONARY STENT PLACEMENT    . INGUINAL HERNIA REPAIR  04/25/2012   right- laparoscopic  . PACEMAKER IMPLANT N/A 03/09/2018   Procedure: PACEMAKER IMPLANT;  Surgeon: Evans Lance, MD;  Location: [facility];  Service: Cardiovascular;  Laterality: N/A;  . R/P Myoview  12/27/2008   no ischemia  . TEE WITHOUT CARDIOVERSION N/A 03/09/2018   Procedure: TRANSESOPHAGEAL ECHOCARDIOGRAM (TEE);  Surgeon: Fay Records, MD;  Location: [facility];  Service: Cardiovascular;  Laterality: N/A;  Bubble Study  . US ECHOCARDIOGRAPHY  12/27/2008   concentric LVH,mild to mod MR, TR and pulmonary hypertension. EF 45-50%   Family History  Problem Relation Age of Onset  . Cancer Father        bone  . Cancer Brother        lymphnode   Social History    ROS Constitutional: Denies fever, chills, weight loss/gain, headaches, insomnia,  night sweats or change in appetite. Does c/o fatigue. Eyes: Denies redness, blurred vision, diplopia, discharge, itchy or watery eyes.  ENT: Denies discharge, congestion, post nasal drip, epistaxis, sore throat, earache, hearing loss, dental pain, Tinnitus, Vertigo, Sinus pain or snoring.  Cardio: Denies chest pain, palpitations, irregular heartbeat, syncope, dyspnea, diaphoresis, orthopnea, PND, claudication or edema Respiratory: denies cough, dyspnea, DOE, pleurisy, hoarseness, laryngitis or wheezing.  Gastrointestinal: Denies dysphagia, heartburn, reflux, water brash, pain, cramps, nausea, vomiting, bloating, diarrhea, constipation, hematemesis, melena, hematochezia, jaundice or hemorrhoids Genitourinary: Denies dysuria, frequency, urgency, nocturia, hesitancy, discharge, hematuria or flank pain Musculoskeletal: Denies arthralgia, myalgia, stiffness, Jt. Swelling, pain, limp or strain/sprain. Denies  Falls. Skin: Denies puritis, rash, hives, warts, acne, eczema or change in skin lesion Neuro: No weakness, tremor, incoordination, spasms, paresthesia or pain Psychiatric: Denies confusion, memory loss or sensory loss. Denies Depression. Endocrine: Denies change in weight, skin, hair change, nocturia, and paresthesia, diabetic polys, visual blurring or hyper / hypo glycemic episodes.  Heme/Lymph: No excessive bleeding, bruising or enlarged lymph nodes.  Physical Exam  BP 132/64   Pulse 71   Temp (!) 97.2 F (36.2 C)   Resp 16   Ht 5\' 6"  (1.676 m)   Wt 157 lb 9.6 oz (71.5 kg)   SpO2 96%   BMI 25.44 kg/m   General Appearance: Well nourished and well groomed and in no apparent distress.  Eyes: PERRLA, EOMs, conjunctiva no swelling or erythema, normal fundi and vessels. Sinuses: No frontal/maxillary tenderness ENT/Mouth: EACs patent / TMs  nl. Nares clear without erythema, swelling, mucoid exudates. Oral hygiene is good. No erythema, swelling, or exudate. Tongue normal, non-obstructing. Tonsils not  swollen or erythematous. Hearing normal.  Neck: Supple, thyroid not palpable. No bruits, nodes or JVD. Respiratory: Respiratory effort normal.  BS equal and clear bilateral without rales, rhonci, wheezing or stridor. Cardio: Heart sounds are normal with regular rate and rhythm and no murmurs, rubs or gallops. Peripheral pulses are normal and equal bilaterally without edema. No aortic or femoral bruits. Chest: symmetric with normal excursions and percussion.  Abdomen: Soft, with Nl bowel sounds. Nontender, no guarding, rebound, hernias, masses, or organomegaly.  Lymphatics: Non tender without lymphadenopathy.  Musculoskeletal: Full ROM all peripheral extremities, joint stability, 5/5 strength, and normal gait. Skin: Warm and dry without rashes, lesions, cyanosis, clubbing or  ecchymosis.  Neuro: Cranial nerves intact, reflexes equal bilaterally. Normal muscle tone, no cerebellar symptoms.  Sensation intact.  Pysch: Alert and oriented X 3 with normal affect, insight and judgment appropriate.   Assessment and Plan  1. Essential hypertension  - EKG 12-Lead - [place], RETROPERITNL ABD,  LTD - Urinalysis, Routine w reflex microscopic - Microalbumin / creatinine urine ratio - CBC with Differential/Platelet - COMPLETE METABOLIC PANEL WITH GFR - Magnesium - TSH  2. Hyperlipidemia, mixed  - EKG 12-Lead - [place], RETROPERITNL ABD,  LTD - Lipid panel - TSH  3. Abnormal glucose  - EKG 12-Lead - [place], RETROPERITNL ABD,  LTD - Hemoglobin A1c - Insulin, random  4. Vitamin D deficiency  - VITAMIN D 25 Hydroxy (Vit-D Deficiency, Fractures)  5. Prediabetes  - EKG 12-Lead - [place], RETROPERITNL ABD,  LTD - Hemoglobin A1c - Insulin, random  6. Coronary artery disease involving native coronary artery of native heart with other form of angina pectoris ([place])  - Lipid panel  7. Chronic combined systolic and diastolic heart failure (HCC)  - EKG 12-Lead  8. Pacemaker  - EKG 12-Lead  9. Paroxysmal atrial flutter (HCC)  - EKG 12-Lead  10. Screening for colorectal cancer  - POC Hemoccult Bld/Stl  11. Screening for ischemic heart disease  - EKG 12-Lead  12. FHx: heart disease  - EKG 12-Lead  13. Former smoker  - EKG 12-Lead - [place], RETROPERITNL ABD,  LTD  14. Atherosclerosis of aorta ([place])  - [place], RETROPERITNL ABD,  LTD  15. Screening for AAA (aortic abdominal aneurysm)  - [place], RETROPERITNL ABD,  LTD  16. Medication management  - Urinalysis, Routine w reflex microscopic - Microalbumin / creatinine urine ratio - CBC with Differential/Platelet - COMPLETE METABOLIC PANEL WITH GFR - Magnesium - Lipid panel - TSH - Hemoglobin A1c - Insulin, random - VITAMIN D 25 Hydroxyl  17. BPH with obstruction/lower urinary tract symptoms  - PSA  18. Prostate cancer screening  - PSA  19. Cardiomyopathy, ischemic         Patient was counseled in prudent diet, weight  control to achieve/maintain BMI less than 25, BP monitoring, regular exercise and medications as discussed.  Discussed med effects and SE's. Routine screening labs and tests as requested with regular follow-up as recommended. Over 40 minutes of exam, counseling, chart review and high complex critical decision making was performed   Kirtland Bouchard, MD

## 2019-04-27 LAB — URINALYSIS, ROUTINE W REFLEX MICROSCOPIC
Bilirubin Urine: NEGATIVE
Glucose, UA: NEGATIVE
Hgb urine dipstick: NEGATIVE
Leukocytes,Ua: NEGATIVE
Nitrite: NEGATIVE
Protein, ur: NEGATIVE
Specific Gravity, Urine: 1.026 (ref 1.001–1.03)
pH: <=5 (ref 5.0–8.0)

## 2019-04-27 LAB — HEMOGLOBIN A1C
Hgb A1c MFr Bld: 5.9 % of total Hgb — ABNORMAL HIGH (ref <5.7)
Mean Plasma Glucose: 123 (calc)
eAG (mmol/L): 6.8 (calc)

## 2019-04-27 LAB — COMPLETE METABOLIC PANEL WITH GFR
AG Ratio: 1.7 (calc) (ref 1.0–2.5)
ALT: 20 U/L (ref 9–46)
AST: 20 U/L (ref 10–35)
Albumin: 4 g/dL (ref 3.6–5.1)
Alkaline phosphatase (APISO): 62 U/L (ref 35–144)
BUN: 16 mg/dL (ref 7–25)
CO2: 28 mmol/L (ref 20–32)
Calcium: 9.5 mg/dL (ref 8.6–10.3)
Chloride: 105 mmol/L (ref 98–110)
Creat: 0.94 mg/dL (ref 0.70–1.11)
GFR, Est African American: 86 mL/min/{1.73_m2} (ref >=60)
GFR, Est Non African American: 74 mL/min/{1.73_m2} (ref >=60)
Globulin: 2.3 g/dL (calc) (ref 1.9–3.7)
Glucose, Bld: 89 mg/dL (ref 65–99)
Potassium: 4.1 mmol/L (ref 3.5–5.3)
Sodium: 141 mmol/L (ref 135–146)
Total Bilirubin: 0.5 mg/dL (ref 0.2–1.2)
Total Protein: 6.3 g/dL (ref 6.1–8.1)

## 2019-04-27 LAB — TSH: TSH: 0.21 mIU/L — ABNORMAL LOW (ref 0.40–4.50)

## 2019-04-27 LAB — CBC WITH DIFFERENTIAL/PLATELET
Absolute Monocytes: 956 cells/uL — ABNORMAL HIGH (ref 200–950)
Basophils Absolute: 32 cells/uL (ref 0–200)
Basophils Relative: 0.4 %
Eosinophils Absolute: 134 cells/uL (ref 15–500)
Eosinophils Relative: 1.7 %
HCT: 44.7 % (ref 38.5–50.0)
Hemoglobin: 15.2 g/dL (ref 13.2–17.1)
Lymphs Abs: 2481 cells/uL (ref 850–3900)
MCH: 31.6 pg (ref 27.0–33.0)
MCHC: 34 g/dL (ref 32.0–36.0)
MCV: 92.9 fL (ref 80.0–100.0)
MPV: 10.8 fL (ref 7.5–12.5)
Monocytes Relative: 12.1 %
Neutro Abs: 4298 cells/uL (ref 1500–7800)
Neutrophils Relative %: 54.4 %
Platelets: 199 10*3/uL (ref 140–400)
RBC: 4.81 10*6/uL (ref 4.20–5.80)
RDW: 13.2 % (ref 11.0–15.0)
Total Lymphocyte: 31.4 %
WBC: 7.9 10*3/uL (ref 3.8–10.8)

## 2019-04-27 LAB — LIPID PANEL
Cholesterol: 141 mg/dL (ref <200)
HDL: 48 mg/dL (ref >=40)
LDL Cholesterol (Calc): 67 mg/dL (calc)
Non-HDL Cholesterol (Calc): 93 mg/dL (calc) (ref <130)
Total CHOL/HDL Ratio: 2.9 (calc) (ref <5.0)
Triglycerides: 183 mg/dL — ABNORMAL HIGH (ref <150)

## 2019-04-27 LAB — MICROALBUMIN / CREATININE URINE RATIO
Creatinine, Urine: 191 mg/dL (ref 20–320)
Microalb Creat Ratio: 8 mcg/mg creat (ref <30)
Microalb, Ur: 1.6 mg/dL

## 2019-04-27 LAB — MAGNESIUM: Magnesium: 1.8 mg/dL (ref 1.5–2.5)

## 2019-04-27 LAB — INSULIN, RANDOM: Insulin: 6.9 u[IU]/mL

## 2019-04-27 LAB — PSA: PSA: 42.2 ng/mL — ABNORMAL HIGH (ref <=4.0)

## 2019-04-27 LAB — VITAMIN D 25 HYDROXY (VIT D DEFICIENCY, FRACTURES): Vit D, 25-Hydroxy: 61 ng/mL (ref 30–100)

## 2019-04-28 ENCOUNTER — Encounter: Payer: Self-pay | Admitting: Internal Medicine

## 2019-04-29 ENCOUNTER — Encounter: Payer: Self-pay | Admitting: Internal Medicine

## 2019-04-30 ENCOUNTER — Encounter: Payer: Self-pay | Admitting: *Deleted

## 2019-05-14 ENCOUNTER — Other Ambulatory Visit: Payer: Self-pay | Admitting: Nurse Practitioner

## 2019-05-15 ENCOUNTER — Other Ambulatory Visit: Payer: Self-pay

## 2019-05-15 DIAGNOSIS — Z1212 Encounter for screening for malignant neoplasm of rectum: Secondary | ICD-10-CM

## 2019-05-15 DIAGNOSIS — Z1211 Encounter for screening for malignant neoplasm of colon: Secondary | ICD-10-CM

## 2019-05-15 LAB — POC HEMOCCULT BLD/STL (HOME/3-CARD/SCREEN)
Card #2 Fecal Occult Blod, POC: NEGATIVE
Card #3 Fecal Occult Blood, POC: NEGATIVE
Fecal Occult Blood, POC: NEGATIVE

## 2019-05-16 DIAGNOSIS — Z1212 Encounter for screening for malignant neoplasm of rectum: Secondary | ICD-10-CM | POA: Diagnosis not present

## 2019-05-17 ENCOUNTER — Other Ambulatory Visit: Payer: Self-pay

## 2019-05-30 DIAGNOSIS — M9904 Segmental and somatic dysfunction of sacral region: Secondary | ICD-10-CM | POA: Diagnosis not present

## 2019-05-30 DIAGNOSIS — M4186 Other forms of scoliosis, lumbar region: Secondary | ICD-10-CM | POA: Diagnosis not present

## 2019-05-30 DIAGNOSIS — M9903 Segmental and somatic dysfunction of lumbar region: Secondary | ICD-10-CM | POA: Diagnosis not present

## 2019-05-30 DIAGNOSIS — M9902 Segmental and somatic dysfunction of thoracic region: Secondary | ICD-10-CM | POA: Diagnosis not present

## 2019-05-30 DIAGNOSIS — M791 Myalgia, unspecified site: Secondary | ICD-10-CM | POA: Diagnosis not present

## 2019-05-30 DIAGNOSIS — M4722 Other spondylosis with radiculopathy, cervical region: Secondary | ICD-10-CM | POA: Diagnosis not present

## 2019-05-30 DIAGNOSIS — R293 Abnormal posture: Secondary | ICD-10-CM | POA: Diagnosis not present

## 2019-05-30 DIAGNOSIS — M5126 Other intervertebral disc displacement, lumbar region: Secondary | ICD-10-CM | POA: Diagnosis not present

## 2019-05-30 DIAGNOSIS — M4727 Other spondylosis with radiculopathy, lumbosacral region: Secondary | ICD-10-CM | POA: Diagnosis not present

## 2019-05-30 DIAGNOSIS — M9901 Segmental and somatic dysfunction of cervical region: Secondary | ICD-10-CM | POA: Diagnosis not present

## 2019-05-30 DIAGNOSIS — M5137 Other intervertebral disc degeneration, lumbosacral region: Secondary | ICD-10-CM | POA: Diagnosis not present

## 2019-06-04 DIAGNOSIS — M5137 Other intervertebral disc degeneration, lumbosacral region: Secondary | ICD-10-CM | POA: Diagnosis not present

## 2019-06-04 DIAGNOSIS — M4722 Other spondylosis with radiculopathy, cervical region: Secondary | ICD-10-CM | POA: Diagnosis not present

## 2019-06-04 DIAGNOSIS — M9901 Segmental and somatic dysfunction of cervical region: Secondary | ICD-10-CM | POA: Diagnosis not present

## 2019-06-04 DIAGNOSIS — M4186 Other forms of scoliosis, lumbar region: Secondary | ICD-10-CM | POA: Diagnosis not present

## 2019-06-04 DIAGNOSIS — M5126 Other intervertebral disc displacement, lumbar region: Secondary | ICD-10-CM | POA: Diagnosis not present

## 2019-06-04 DIAGNOSIS — M4727 Other spondylosis with radiculopathy, lumbosacral region: Secondary | ICD-10-CM | POA: Diagnosis not present

## 2019-06-04 DIAGNOSIS — M791 Myalgia, unspecified site: Secondary | ICD-10-CM | POA: Diagnosis not present

## 2019-06-04 DIAGNOSIS — M9902 Segmental and somatic dysfunction of thoracic region: Secondary | ICD-10-CM | POA: Diagnosis not present

## 2019-06-04 DIAGNOSIS — M9904 Segmental and somatic dysfunction of sacral region: Secondary | ICD-10-CM | POA: Diagnosis not present

## 2019-06-04 DIAGNOSIS — M9903 Segmental and somatic dysfunction of lumbar region: Secondary | ICD-10-CM | POA: Diagnosis not present

## 2019-06-04 DIAGNOSIS — R293 Abnormal posture: Secondary | ICD-10-CM | POA: Diagnosis not present

## 2019-06-05 DIAGNOSIS — M4727 Other spondylosis with radiculopathy, lumbosacral region: Secondary | ICD-10-CM | POA: Diagnosis not present

## 2019-06-05 DIAGNOSIS — M9903 Segmental and somatic dysfunction of lumbar region: Secondary | ICD-10-CM | POA: Diagnosis not present

## 2019-06-05 DIAGNOSIS — R293 Abnormal posture: Secondary | ICD-10-CM | POA: Diagnosis not present

## 2019-06-05 DIAGNOSIS — M9902 Segmental and somatic dysfunction of thoracic region: Secondary | ICD-10-CM | POA: Diagnosis not present

## 2019-06-05 DIAGNOSIS — M4722 Other spondylosis with radiculopathy, cervical region: Secondary | ICD-10-CM | POA: Diagnosis not present

## 2019-06-05 DIAGNOSIS — M9901 Segmental and somatic dysfunction of cervical region: Secondary | ICD-10-CM | POA: Diagnosis not present

## 2019-06-05 DIAGNOSIS — M4186 Other forms of scoliosis, lumbar region: Secondary | ICD-10-CM | POA: Diagnosis not present

## 2019-06-05 DIAGNOSIS — M9904 Segmental and somatic dysfunction of sacral region: Secondary | ICD-10-CM | POA: Diagnosis not present

## 2019-06-05 DIAGNOSIS — M791 Myalgia, unspecified site: Secondary | ICD-10-CM | POA: Diagnosis not present

## 2019-06-05 DIAGNOSIS — M5137 Other intervertebral disc degeneration, lumbosacral region: Secondary | ICD-10-CM | POA: Diagnosis not present

## 2019-06-05 DIAGNOSIS — M5126 Other intervertebral disc displacement, lumbar region: Secondary | ICD-10-CM | POA: Diagnosis not present

## 2019-06-07 DIAGNOSIS — M4722 Other spondylosis with radiculopathy, cervical region: Secondary | ICD-10-CM | POA: Diagnosis not present

## 2019-06-07 DIAGNOSIS — M9902 Segmental and somatic dysfunction of thoracic region: Secondary | ICD-10-CM | POA: Diagnosis not present

## 2019-06-07 DIAGNOSIS — M9903 Segmental and somatic dysfunction of lumbar region: Secondary | ICD-10-CM | POA: Diagnosis not present

## 2019-06-07 DIAGNOSIS — R293 Abnormal posture: Secondary | ICD-10-CM | POA: Diagnosis not present

## 2019-06-07 DIAGNOSIS — M5137 Other intervertebral disc degeneration, lumbosacral region: Secondary | ICD-10-CM | POA: Diagnosis not present

## 2019-06-07 DIAGNOSIS — M4186 Other forms of scoliosis, lumbar region: Secondary | ICD-10-CM | POA: Diagnosis not present

## 2019-06-07 DIAGNOSIS — M4727 Other spondylosis with radiculopathy, lumbosacral region: Secondary | ICD-10-CM | POA: Diagnosis not present

## 2019-06-07 DIAGNOSIS — M9904 Segmental and somatic dysfunction of sacral region: Secondary | ICD-10-CM | POA: Diagnosis not present

## 2019-06-07 DIAGNOSIS — M9901 Segmental and somatic dysfunction of cervical region: Secondary | ICD-10-CM | POA: Diagnosis not present

## 2019-06-07 DIAGNOSIS — M791 Myalgia, unspecified site: Secondary | ICD-10-CM | POA: Diagnosis not present

## 2019-06-07 DIAGNOSIS — M5126 Other intervertebral disc displacement, lumbar region: Secondary | ICD-10-CM | POA: Diagnosis not present

## 2019-06-10 ENCOUNTER — Other Ambulatory Visit: Payer: Self-pay | Admitting: Internal Medicine

## 2019-06-10 DIAGNOSIS — M62838 Other muscle spasm: Secondary | ICD-10-CM

## 2019-06-10 MED ORDER — DIAZEPAM 5 MG PO TABS: ORAL_TABLET | ORAL | 0 refills | Status: DC

## 2019-06-11 DIAGNOSIS — M5137 Other intervertebral disc degeneration, lumbosacral region: Secondary | ICD-10-CM | POA: Diagnosis not present

## 2019-06-11 DIAGNOSIS — M9903 Segmental and somatic dysfunction of lumbar region: Secondary | ICD-10-CM | POA: Diagnosis not present

## 2019-06-11 DIAGNOSIS — M4722 Other spondylosis with radiculopathy, cervical region: Secondary | ICD-10-CM | POA: Diagnosis not present

## 2019-06-11 DIAGNOSIS — R293 Abnormal posture: Secondary | ICD-10-CM | POA: Diagnosis not present

## 2019-06-11 DIAGNOSIS — M9901 Segmental and somatic dysfunction of cervical region: Secondary | ICD-10-CM | POA: Diagnosis not present

## 2019-06-11 DIAGNOSIS — M791 Myalgia, unspecified site: Secondary | ICD-10-CM | POA: Diagnosis not present

## 2019-06-11 DIAGNOSIS — M4727 Other spondylosis with radiculopathy, lumbosacral region: Secondary | ICD-10-CM | POA: Diagnosis not present

## 2019-06-11 DIAGNOSIS — M9902 Segmental and somatic dysfunction of thoracic region: Secondary | ICD-10-CM | POA: Diagnosis not present

## 2019-06-11 DIAGNOSIS — M4186 Other forms of scoliosis, lumbar region: Secondary | ICD-10-CM | POA: Diagnosis not present

## 2019-06-11 DIAGNOSIS — M9904 Segmental and somatic dysfunction of sacral region: Secondary | ICD-10-CM | POA: Diagnosis not present

## 2019-06-11 DIAGNOSIS — M5126 Other intervertebral disc displacement, lumbar region: Secondary | ICD-10-CM | POA: Diagnosis not present

## 2019-06-12 DIAGNOSIS — M5126 Other intervertebral disc displacement, lumbar region: Secondary | ICD-10-CM | POA: Diagnosis not present

## 2019-06-12 DIAGNOSIS — M4722 Other spondylosis with radiculopathy, cervical region: Secondary | ICD-10-CM | POA: Diagnosis not present

## 2019-06-12 DIAGNOSIS — M5137 Other intervertebral disc degeneration, lumbosacral region: Secondary | ICD-10-CM | POA: Diagnosis not present

## 2019-06-12 DIAGNOSIS — M9901 Segmental and somatic dysfunction of cervical region: Secondary | ICD-10-CM | POA: Diagnosis not present

## 2019-06-12 DIAGNOSIS — R293 Abnormal posture: Secondary | ICD-10-CM | POA: Diagnosis not present

## 2019-06-12 DIAGNOSIS — M9902 Segmental and somatic dysfunction of thoracic region: Secondary | ICD-10-CM | POA: Diagnosis not present

## 2019-06-12 DIAGNOSIS — M9904 Segmental and somatic dysfunction of sacral region: Secondary | ICD-10-CM | POA: Diagnosis not present

## 2019-06-12 DIAGNOSIS — M9903 Segmental and somatic dysfunction of lumbar region: Secondary | ICD-10-CM | POA: Diagnosis not present

## 2019-06-12 DIAGNOSIS — M4186 Other forms of scoliosis, lumbar region: Secondary | ICD-10-CM | POA: Diagnosis not present

## 2019-06-12 DIAGNOSIS — M4727 Other spondylosis with radiculopathy, lumbosacral region: Secondary | ICD-10-CM | POA: Diagnosis not present

## 2019-06-12 DIAGNOSIS — M791 Myalgia, unspecified site: Secondary | ICD-10-CM | POA: Diagnosis not present

## 2019-06-18 ENCOUNTER — Ambulatory Visit (INDEPENDENT_AMBULATORY_CARE_PROVIDER_SITE_OTHER): Payer: Medicare Other | Admitting: *Deleted

## 2019-06-18 DIAGNOSIS — M4727 Other spondylosis with radiculopathy, lumbosacral region: Secondary | ICD-10-CM | POA: Diagnosis not present

## 2019-06-18 DIAGNOSIS — M4722 Other spondylosis with radiculopathy, cervical region: Secondary | ICD-10-CM | POA: Diagnosis not present

## 2019-06-18 DIAGNOSIS — I442 Atrioventricular block, complete: Secondary | ICD-10-CM | POA: Diagnosis not present

## 2019-06-18 DIAGNOSIS — M9902 Segmental and somatic dysfunction of thoracic region: Secondary | ICD-10-CM | POA: Diagnosis not present

## 2019-06-18 DIAGNOSIS — M5137 Other intervertebral disc degeneration, lumbosacral region: Secondary | ICD-10-CM | POA: Diagnosis not present

## 2019-06-18 DIAGNOSIS — M9903 Segmental and somatic dysfunction of lumbar region: Secondary | ICD-10-CM | POA: Diagnosis not present

## 2019-06-18 DIAGNOSIS — M4186 Other forms of scoliosis, lumbar region: Secondary | ICD-10-CM | POA: Diagnosis not present

## 2019-06-18 DIAGNOSIS — R293 Abnormal posture: Secondary | ICD-10-CM | POA: Diagnosis not present

## 2019-06-18 DIAGNOSIS — M5126 Other intervertebral disc displacement, lumbar region: Secondary | ICD-10-CM | POA: Diagnosis not present

## 2019-06-18 DIAGNOSIS — M9901 Segmental and somatic dysfunction of cervical region: Secondary | ICD-10-CM | POA: Diagnosis not present

## 2019-06-18 DIAGNOSIS — M791 Myalgia, unspecified site: Secondary | ICD-10-CM | POA: Diagnosis not present

## 2019-06-18 DIAGNOSIS — M9904 Segmental and somatic dysfunction of sacral region: Secondary | ICD-10-CM | POA: Diagnosis not present

## 2019-06-18 LAB — CUP PACEART REMOTE DEVICE CHECK
Battery Remaining Longevity: 89 mo
Battery Voltage: 2.99 V
Brady Statistic AP VP Percent: 80.4 %
Brady Statistic AP VS Percent: 0.14 %
Brady Statistic AS VP Percent: 18.53 %
Brady Statistic AS VS Percent: 0.93 %
Brady Statistic RA Percent Paced: 81.22 %
Brady Statistic RV Percent Paced: 98.93 %
Date Time Interrogation Session: 20201130004009
Implantable Lead Implant Date: 20190822
Implantable Lead Implant Date: 20190822
Implantable Lead Location: 753859
Implantable Lead Location: 753860
Implantable Lead Model: 3830
Implantable Lead Model: 5076
Implantable Pulse Generator Implant Date: 20190822
Lead Channel Impedance Value: 342 Ohm
Lead Channel Impedance Value: 380 Ohm
Lead Channel Impedance Value: 437 Ohm
Lead Channel Impedance Value: 456 Ohm
Lead Channel Pacing Threshold Amplitude: 0.625 V
Lead Channel Pacing Threshold Amplitude: 0.875 V
Lead Channel Pacing Threshold Pulse Width: 0.4 ms
Lead Channel Pacing Threshold Pulse Width: 0.4 ms
Lead Channel Sensing Intrinsic Amplitude: 12.125 mV
Lead Channel Sensing Intrinsic Amplitude: 12.125 mV
Lead Channel Sensing Intrinsic Amplitude: 5 mV
Lead Channel Sensing Intrinsic Amplitude: 5 mV
Lead Channel Setting Pacing Amplitude: 1.5 V
Lead Channel Setting Pacing Amplitude: 2.5 V
Lead Channel Setting Pacing Pulse Width: 1 ms
Lead Channel Setting Sensing Sensitivity: 2.8 mV

## 2019-06-20 DIAGNOSIS — M4186 Other forms of scoliosis, lumbar region: Secondary | ICD-10-CM | POA: Diagnosis not present

## 2019-06-20 DIAGNOSIS — M4722 Other spondylosis with radiculopathy, cervical region: Secondary | ICD-10-CM | POA: Diagnosis not present

## 2019-06-20 DIAGNOSIS — M5137 Other intervertebral disc degeneration, lumbosacral region: Secondary | ICD-10-CM | POA: Diagnosis not present

## 2019-06-20 DIAGNOSIS — M9903 Segmental and somatic dysfunction of lumbar region: Secondary | ICD-10-CM | POA: Diagnosis not present

## 2019-06-20 DIAGNOSIS — M4727 Other spondylosis with radiculopathy, lumbosacral region: Secondary | ICD-10-CM | POA: Diagnosis not present

## 2019-06-20 DIAGNOSIS — M9901 Segmental and somatic dysfunction of cervical region: Secondary | ICD-10-CM | POA: Diagnosis not present

## 2019-06-20 DIAGNOSIS — R293 Abnormal posture: Secondary | ICD-10-CM | POA: Diagnosis not present

## 2019-06-20 DIAGNOSIS — M9904 Segmental and somatic dysfunction of sacral region: Secondary | ICD-10-CM | POA: Diagnosis not present

## 2019-06-20 DIAGNOSIS — M5126 Other intervertebral disc displacement, lumbar region: Secondary | ICD-10-CM | POA: Diagnosis not present

## 2019-06-20 DIAGNOSIS — M9902 Segmental and somatic dysfunction of thoracic region: Secondary | ICD-10-CM | POA: Diagnosis not present

## 2019-06-20 DIAGNOSIS — M791 Myalgia, unspecified site: Secondary | ICD-10-CM | POA: Diagnosis not present

## 2019-06-21 DIAGNOSIS — M9901 Segmental and somatic dysfunction of cervical region: Secondary | ICD-10-CM | POA: Diagnosis not present

## 2019-06-21 DIAGNOSIS — R293 Abnormal posture: Secondary | ICD-10-CM | POA: Diagnosis not present

## 2019-06-21 DIAGNOSIS — M9904 Segmental and somatic dysfunction of sacral region: Secondary | ICD-10-CM | POA: Diagnosis not present

## 2019-06-21 DIAGNOSIS — M5137 Other intervertebral disc degeneration, lumbosacral region: Secondary | ICD-10-CM | POA: Diagnosis not present

## 2019-06-21 DIAGNOSIS — M9902 Segmental and somatic dysfunction of thoracic region: Secondary | ICD-10-CM | POA: Diagnosis not present

## 2019-06-21 DIAGNOSIS — M5126 Other intervertebral disc displacement, lumbar region: Secondary | ICD-10-CM | POA: Diagnosis not present

## 2019-06-21 DIAGNOSIS — M9903 Segmental and somatic dysfunction of lumbar region: Secondary | ICD-10-CM | POA: Diagnosis not present

## 2019-06-21 DIAGNOSIS — M791 Myalgia, unspecified site: Secondary | ICD-10-CM | POA: Diagnosis not present

## 2019-06-21 DIAGNOSIS — M4722 Other spondylosis with radiculopathy, cervical region: Secondary | ICD-10-CM | POA: Diagnosis not present

## 2019-06-21 DIAGNOSIS — M4186 Other forms of scoliosis, lumbar region: Secondary | ICD-10-CM | POA: Diagnosis not present

## 2019-06-21 DIAGNOSIS — M4727 Other spondylosis with radiculopathy, lumbosacral region: Secondary | ICD-10-CM | POA: Diagnosis not present

## 2019-06-22 ENCOUNTER — Other Ambulatory Visit: Payer: Self-pay | Admitting: *Deleted

## 2019-06-22 NOTE — Patient Outreach (Addendum)
[facility]) Care Management  06/22/2019  Waymon Derenne. 1932-08-24 [id number]   RN Health Coach attempted follow up outreach screen  call to patient.  Patient stated he was on another call and to call back later.  Plan: RN will call patient again within 10 days.  [place] Care Management [phone]

## 2019-06-25 DIAGNOSIS — M9903 Segmental and somatic dysfunction of lumbar region: Secondary | ICD-10-CM | POA: Diagnosis not present

## 2019-06-25 DIAGNOSIS — M4186 Other forms of scoliosis, lumbar region: Secondary | ICD-10-CM | POA: Diagnosis not present

## 2019-06-25 DIAGNOSIS — M5126 Other intervertebral disc displacement, lumbar region: Secondary | ICD-10-CM | POA: Diagnosis not present

## 2019-06-25 DIAGNOSIS — M791 Myalgia, unspecified site: Secondary | ICD-10-CM | POA: Diagnosis not present

## 2019-06-25 DIAGNOSIS — M9902 Segmental and somatic dysfunction of thoracic region: Secondary | ICD-10-CM | POA: Diagnosis not present

## 2019-06-25 DIAGNOSIS — R293 Abnormal posture: Secondary | ICD-10-CM | POA: Diagnosis not present

## 2019-06-25 DIAGNOSIS — M4727 Other spondylosis with radiculopathy, lumbosacral region: Secondary | ICD-10-CM | POA: Diagnosis not present

## 2019-06-25 DIAGNOSIS — M5137 Other intervertebral disc degeneration, lumbosacral region: Secondary | ICD-10-CM | POA: Diagnosis not present

## 2019-06-25 DIAGNOSIS — M4722 Other spondylosis with radiculopathy, cervical region: Secondary | ICD-10-CM | POA: Diagnosis not present

## 2019-06-25 DIAGNOSIS — M9901 Segmental and somatic dysfunction of cervical region: Secondary | ICD-10-CM | POA: Diagnosis not present

## 2019-06-25 DIAGNOSIS — M9904 Segmental and somatic dysfunction of sacral region: Secondary | ICD-10-CM | POA: Diagnosis not present

## 2019-06-26 DIAGNOSIS — H353131 Nonexudative age-related macular degeneration, bilateral, early dry stage: Secondary | ICD-10-CM | POA: Diagnosis not present

## 2019-06-26 DIAGNOSIS — H52203 Unspecified astigmatism, bilateral: Secondary | ICD-10-CM | POA: Diagnosis not present

## 2019-06-27 DIAGNOSIS — M9904 Segmental and somatic dysfunction of sacral region: Secondary | ICD-10-CM | POA: Diagnosis not present

## 2019-06-27 DIAGNOSIS — M5126 Other intervertebral disc displacement, lumbar region: Secondary | ICD-10-CM | POA: Diagnosis not present

## 2019-06-27 DIAGNOSIS — M4186 Other forms of scoliosis, lumbar region: Secondary | ICD-10-CM | POA: Diagnosis not present

## 2019-06-27 DIAGNOSIS — M4722 Other spondylosis with radiculopathy, cervical region: Secondary | ICD-10-CM | POA: Diagnosis not present

## 2019-06-27 DIAGNOSIS — M4727 Other spondylosis with radiculopathy, lumbosacral region: Secondary | ICD-10-CM | POA: Diagnosis not present

## 2019-06-27 DIAGNOSIS — M791 Myalgia, unspecified site: Secondary | ICD-10-CM | POA: Diagnosis not present

## 2019-06-27 DIAGNOSIS — R293 Abnormal posture: Secondary | ICD-10-CM | POA: Diagnosis not present

## 2019-06-27 DIAGNOSIS — M5137 Other intervertebral disc degeneration, lumbosacral region: Secondary | ICD-10-CM | POA: Diagnosis not present

## 2019-06-27 DIAGNOSIS — M9902 Segmental and somatic dysfunction of thoracic region: Secondary | ICD-10-CM | POA: Diagnosis not present

## 2019-06-27 DIAGNOSIS — M9903 Segmental and somatic dysfunction of lumbar region: Secondary | ICD-10-CM | POA: Diagnosis not present

## 2019-06-27 DIAGNOSIS — M9901 Segmental and somatic dysfunction of cervical region: Secondary | ICD-10-CM | POA: Diagnosis not present

## 2019-06-28 DIAGNOSIS — M9904 Segmental and somatic dysfunction of sacral region: Secondary | ICD-10-CM | POA: Diagnosis not present

## 2019-06-28 DIAGNOSIS — M5126 Other intervertebral disc displacement, lumbar region: Secondary | ICD-10-CM | POA: Diagnosis not present

## 2019-06-28 DIAGNOSIS — M791 Myalgia, unspecified site: Secondary | ICD-10-CM | POA: Diagnosis not present

## 2019-06-28 DIAGNOSIS — R293 Abnormal posture: Secondary | ICD-10-CM | POA: Diagnosis not present

## 2019-06-28 DIAGNOSIS — M4722 Other spondylosis with radiculopathy, cervical region: Secondary | ICD-10-CM | POA: Diagnosis not present

## 2019-06-28 DIAGNOSIS — M9901 Segmental and somatic dysfunction of cervical region: Secondary | ICD-10-CM | POA: Diagnosis not present

## 2019-06-28 DIAGNOSIS — M9902 Segmental and somatic dysfunction of thoracic region: Secondary | ICD-10-CM | POA: Diagnosis not present

## 2019-06-28 DIAGNOSIS — M4186 Other forms of scoliosis, lumbar region: Secondary | ICD-10-CM | POA: Diagnosis not present

## 2019-06-28 DIAGNOSIS — M9903 Segmental and somatic dysfunction of lumbar region: Secondary | ICD-10-CM | POA: Diagnosis not present

## 2019-06-28 DIAGNOSIS — M4727 Other spondylosis with radiculopathy, lumbosacral region: Secondary | ICD-10-CM | POA: Diagnosis not present

## 2019-06-28 DIAGNOSIS — M5137 Other intervertebral disc degeneration, lumbosacral region: Secondary | ICD-10-CM | POA: Diagnosis not present

## 2019-06-29 DIAGNOSIS — C61 Malignant neoplasm of prostate: Secondary | ICD-10-CM | POA: Diagnosis not present

## 2019-07-02 DIAGNOSIS — M4722 Other spondylosis with radiculopathy, cervical region: Secondary | ICD-10-CM | POA: Diagnosis not present

## 2019-07-02 DIAGNOSIS — M5126 Other intervertebral disc displacement, lumbar region: Secondary | ICD-10-CM | POA: Diagnosis not present

## 2019-07-02 DIAGNOSIS — M9902 Segmental and somatic dysfunction of thoracic region: Secondary | ICD-10-CM | POA: Diagnosis not present

## 2019-07-02 DIAGNOSIS — M791 Myalgia, unspecified site: Secondary | ICD-10-CM | POA: Diagnosis not present

## 2019-07-02 DIAGNOSIS — M9903 Segmental and somatic dysfunction of lumbar region: Secondary | ICD-10-CM | POA: Diagnosis not present

## 2019-07-02 DIAGNOSIS — M9904 Segmental and somatic dysfunction of sacral region: Secondary | ICD-10-CM | POA: Diagnosis not present

## 2019-07-02 DIAGNOSIS — R293 Abnormal posture: Secondary | ICD-10-CM | POA: Diagnosis not present

## 2019-07-02 DIAGNOSIS — M4186 Other forms of scoliosis, lumbar region: Secondary | ICD-10-CM | POA: Diagnosis not present

## 2019-07-02 DIAGNOSIS — M5137 Other intervertebral disc degeneration, lumbosacral region: Secondary | ICD-10-CM | POA: Diagnosis not present

## 2019-07-02 DIAGNOSIS — M9901 Segmental and somatic dysfunction of cervical region: Secondary | ICD-10-CM | POA: Diagnosis not present

## 2019-07-02 DIAGNOSIS — M4727 Other spondylosis with radiculopathy, lumbosacral region: Secondary | ICD-10-CM | POA: Diagnosis not present

## 2019-07-05 DIAGNOSIS — M9904 Segmental and somatic dysfunction of sacral region: Secondary | ICD-10-CM | POA: Diagnosis not present

## 2019-07-05 DIAGNOSIS — M4722 Other spondylosis with radiculopathy, cervical region: Secondary | ICD-10-CM | POA: Diagnosis not present

## 2019-07-05 DIAGNOSIS — M791 Myalgia, unspecified site: Secondary | ICD-10-CM | POA: Diagnosis not present

## 2019-07-05 DIAGNOSIS — M4186 Other forms of scoliosis, lumbar region: Secondary | ICD-10-CM | POA: Diagnosis not present

## 2019-07-05 DIAGNOSIS — M9902 Segmental and somatic dysfunction of thoracic region: Secondary | ICD-10-CM | POA: Diagnosis not present

## 2019-07-05 DIAGNOSIS — M5126 Other intervertebral disc displacement, lumbar region: Secondary | ICD-10-CM | POA: Diagnosis not present

## 2019-07-05 DIAGNOSIS — M5137 Other intervertebral disc degeneration, lumbosacral region: Secondary | ICD-10-CM | POA: Diagnosis not present

## 2019-07-05 DIAGNOSIS — M4727 Other spondylosis with radiculopathy, lumbosacral region: Secondary | ICD-10-CM | POA: Diagnosis not present

## 2019-07-05 DIAGNOSIS — M9903 Segmental and somatic dysfunction of lumbar region: Secondary | ICD-10-CM | POA: Diagnosis not present

## 2019-07-05 DIAGNOSIS — R293 Abnormal posture: Secondary | ICD-10-CM | POA: Diagnosis not present

## 2019-07-05 DIAGNOSIS — M9901 Segmental and somatic dysfunction of cervical region: Secondary | ICD-10-CM | POA: Diagnosis not present

## 2019-07-10 DIAGNOSIS — M4727 Other spondylosis with radiculopathy, lumbosacral region: Secondary | ICD-10-CM | POA: Diagnosis not present

## 2019-07-10 DIAGNOSIS — M4186 Other forms of scoliosis, lumbar region: Secondary | ICD-10-CM | POA: Diagnosis not present

## 2019-07-10 DIAGNOSIS — M791 Myalgia, unspecified site: Secondary | ICD-10-CM | POA: Diagnosis not present

## 2019-07-10 DIAGNOSIS — M9902 Segmental and somatic dysfunction of thoracic region: Secondary | ICD-10-CM | POA: Diagnosis not present

## 2019-07-10 DIAGNOSIS — R293 Abnormal posture: Secondary | ICD-10-CM | POA: Diagnosis not present

## 2019-07-10 DIAGNOSIS — M4722 Other spondylosis with radiculopathy, cervical region: Secondary | ICD-10-CM | POA: Diagnosis not present

## 2019-07-10 DIAGNOSIS — M5126 Other intervertebral disc displacement, lumbar region: Secondary | ICD-10-CM | POA: Diagnosis not present

## 2019-07-10 DIAGNOSIS — M9904 Segmental and somatic dysfunction of sacral region: Secondary | ICD-10-CM | POA: Diagnosis not present

## 2019-07-10 DIAGNOSIS — M5137 Other intervertebral disc degeneration, lumbosacral region: Secondary | ICD-10-CM | POA: Diagnosis not present

## 2019-07-10 DIAGNOSIS — M9901 Segmental and somatic dysfunction of cervical region: Secondary | ICD-10-CM | POA: Diagnosis not present

## 2019-07-10 DIAGNOSIS — M9903 Segmental and somatic dysfunction of lumbar region: Secondary | ICD-10-CM | POA: Diagnosis not present

## 2019-07-11 ENCOUNTER — Telehealth: Payer: Self-pay

## 2019-07-11 MED ORDER — LOSARTAN POTASSIUM 25 MG PO TABS: 12.5000 mg | ORAL_TABLET | Freq: Every day | ORAL | 3 refills | Status: DC

## 2019-07-11 NOTE — Telephone Encounter (Signed)
Patient called to discuss medications. Patient recently looked up what Losartan is for and was concerned that he is taking multiple blood pressure medications. Instructed on Losartan dose as prescribed in last office note. Patient to take 12.5mg  daily instead of 25mg . Medication education provided. Patient reports he feels tired during the day after taking metoprolol, educated patient to take metoprolol at night to see if that resolves his daytime tiredness. Educated patient on how to monitor his blood pressure.Patient verbalized understanding.

## 2019-07-11 NOTE — Progress Notes (Signed)
PPM remote 

## 2019-07-11 NOTE — Telephone Encounter (Signed)
Pt called in with concerns of taking his losartan and his other blood pressure medications. Pt states that he is worried about taking multiple blood pressure medications. Please give him a call back at [phone].

## 2019-07-16 DIAGNOSIS — M9902 Segmental and somatic dysfunction of thoracic region: Secondary | ICD-10-CM | POA: Diagnosis not present

## 2019-07-16 DIAGNOSIS — R293 Abnormal posture: Secondary | ICD-10-CM | POA: Diagnosis not present

## 2019-07-16 DIAGNOSIS — M4722 Other spondylosis with radiculopathy, cervical region: Secondary | ICD-10-CM | POA: Diagnosis not present

## 2019-07-16 DIAGNOSIS — M791 Myalgia, unspecified site: Secondary | ICD-10-CM | POA: Diagnosis not present

## 2019-07-16 DIAGNOSIS — M4186 Other forms of scoliosis, lumbar region: Secondary | ICD-10-CM | POA: Diagnosis not present

## 2019-07-16 DIAGNOSIS — M9904 Segmental and somatic dysfunction of sacral region: Secondary | ICD-10-CM | POA: Diagnosis not present

## 2019-07-16 DIAGNOSIS — M5126 Other intervertebral disc displacement, lumbar region: Secondary | ICD-10-CM | POA: Diagnosis not present

## 2019-07-16 DIAGNOSIS — M4727 Other spondylosis with radiculopathy, lumbosacral region: Secondary | ICD-10-CM | POA: Diagnosis not present

## 2019-07-16 DIAGNOSIS — M5137 Other intervertebral disc degeneration, lumbosacral region: Secondary | ICD-10-CM | POA: Diagnosis not present

## 2019-07-16 DIAGNOSIS — M9903 Segmental and somatic dysfunction of lumbar region: Secondary | ICD-10-CM | POA: Diagnosis not present

## 2019-07-16 DIAGNOSIS — M9901 Segmental and somatic dysfunction of cervical region: Secondary | ICD-10-CM | POA: Diagnosis not present

## 2019-07-18 NOTE — Progress Notes (Signed)
Patient ID: Paul Nap., male   DOB: August 04, 1932, 85 y.o.   MRN: [id number]  Assessment and Plan:   Nonsustained ventricular tachycardia ([place]) Following up with pacemaker  Atherosclerosis of aorta (HCC) Control blood pressure, cholesterol, glucose, increase exercise.   Paroxysmal atrial flutter (HCC) S/p ablation, now on elliquis  Complete heart block ([place]) S/p pacemaker, doing well  Chronic combined systolic and diastolic heart failure ([place]) CHF Disease process and medications discussed. Questions answered fully. Encouraged daily monitoring of the patient's weight, call office if 5 lb weight loss or gain in a day.   Acquired thrombophilia ([place]) On eliquis, continue to monitor  Essential hypertension - continue medications, DASH diet, exercise and monitor at home. Call if greater than 130/80.  -     CBC with Differential/Platelet -     COMPLETE METABOLIC PANEL WITH GFR -     TSH  Hyperlipidemia check lipids decrease fatty foods increase activity.  -     Lipid panel  Recurrent major depressive disorder, in full remission ([place]) - continue medications, stress management techniques discussed, increase water, good sleep hygiene discussed, increase exercise, and increase veggies.   Other abnormal glucose -     Hemoglobin A1c Discussed disease progression and risks Discussed diet/exercise, weight management and risk modification  Senile purpura ([place]) Discussed process, protect skin, sunscreen  B12 deficiency -     Vitamin B12  Neuropathy Increase gabapentin, see patient instructions Follow up with ortho No red flags The patient was advised to call immediately if he has any concerning symptoms in the interval. The patient voices understanding of current treatment options and is in agreement with the current care plan.The patient knows to call the clinic with any problems, questions or concerns or go to the ER if any further progression of symptoms.    Continue  diet and meds as discussed. Further disposition pending results of labs. Future Appointments  Date Time Provider [place]  09/17/2019  8:25 AM [facility] DEVICE REMOTES [facility]  12/04/2019  3:45 PM Vicie Mutters, PA-C [facility] None  03/17/2020  8:25 AM [facility] DEVICE REMOTES [facility]  05/28/2020  3:00 PM Unk Pinto, MD [facility] None  06/16/2020  8:25 AM [facility] DEVICE REMOTES [facility]    HPI 85 y.o. male  presents for 3 month follow up with hypertension, hyperlipidemia, prediabetes and vitamin D.  Wife, [name], had a stroke, having some aphasia and she is now in rehab x Nov 3rd at [facility], he has not been able to see her except seeing her through glass/on telephone. Having issues with trying to get her into a long term assistance place that is covered. States his daughter (step daughter) is helping a lot, they have an elder Engineer, mining.   He was having weight loss, improved with stopping of wellbutrin and added celexa. Normal CT AB.  last cologuard 2019, colonoscopy 2016, Remote history of smoking, CXR 02/2018 BMI is Body mass index is 27.76 kg/m., he is working on diet and exercise. Wt Readings from Last 3 Encounters:  07/23/19 172 lb (78 kg)  04/26/19 157 lb 9.6 oz (71.5 kg)  01/15/19 162 lb (73.5 kg)   He has had some numbness/tingling in his toes x years.. He has history of lumbar radiculopathy due to spinal stenosis and has seen Dr. Damita Lack, last visit 06/2018. He is on gabapentin 100mg  3 x a day, worse first thing in the morning. He is seeing a Restaurant manager, fast food.  Lab Results  Component Value Date  TSH 0.21 (L) 04/26/2019   Lab Results  Component Value Date   [id number] 12/30/2015    Patient had a flutter and CHB in July, he is s/p pacemaker and ablation, following with Dr. Sallyanne Kuster. He had a stress test 04/12/2018, had EF 30-44%. Heart cath 2012 negative. CABG 1986.  No CP or SOB.  He is on  eliquis- CBC is being monitored   His blood pressure has been controlled at home, today their BP is BP: 126/80.    He does not workout.   He denies chest pain, shortness of breath, dizziness.  He is on cholesterol medication and denies myalgias. His cholesterol is at goal less than 70. The cholesterol last visit was:   Lab Results  Component Value Date   CHOL 141 04/26/2019   HDL 48 04/26/2019   LDLCALC 67 04/26/2019   TRIG 183 (H) 04/26/2019   CHOLHDL 2.9 04/26/2019    He has not been working on diet and exercise for prediabetes, and denies foot ulcerations, hyperglycemia, hypoglycemia , increased appetite, nausea, paresthesia of the feet, polydipsia, polyuria, visual disturbances, vomiting and weight loss. Last A1C in the office was:  Lab Results  Component Value Date   HGBA1C 5.9 (H) 04/26/2019   Patient is on Vitamin D supplement.  Lab Results  Component Value Date   VD25OH 61 04/26/2019     History of kidney stones follows with [facility].   Current Medications:  Current Outpatient Medications on File Prior to Visit  Medication Sig Dispense Refill  . acetaminophen (TYLENOL) 325 MG tablet Take 650 mg by mouth every 6 (six) hours as needed.    [name] apixaban (ELIQUIS) 5 MG TABS tablet Take 1 tablet (5 mg total) by mouth 2 (two) times daily. 180 tablet 3  . atorvastatin (LIPITOR) 40 MG tablet Take 1 tablet daily for Cholesterol 90 tablet 3  . citalopram (CELEXA) 40 MG tablet Take 1 tablet (40 mg total) by mouth daily. 90 tablet 2  . co-enzyme Q-10 30 MG capsule Take 30 mg by mouth daily.     . diazepam (VALIUM) 5 MG tablet Take 1/2 to 1 tablet 2 to 3 x / day only if needed for Muscle spasms  & please try to limit to 5 days /week to avoid addiction 90 tablet 0  . Digestive Enzymes CAPS Take 1-2 capsules by mouth as needed (to aid digestive function).     . gabapentin (NEURONTIN) 100 MG capsule Take 1 capsule 3 x /day as needed for Pain 270 capsule 1  . hydrochlorothiazide  (HYDRODIURIL) 25 MG tablet TAKE 1 TABLET DAILY ONLY  IF  NEEDED  FOR  ANKLE  SWELLING 90 tablet 1  . losartan (COZAAR) 25 MG tablet Take 0.5 tablets (12.5 mg total) by mouth daily. 45 tablet 3  . Magnesium Chloride (MAGNESIUM DR PO) Take 2 tablets by mouth daily.     . metoprolol succinate (TOPROL-XL) 25 MG 24 hr tablet Take 1 tablet Daily for BP & Heart 90 tablet 3  . nitroGLYCERIN (NITROSTAT) 0.4 MG SL tablet Place 1 tablet (0.4 mg total) under the tongue every 5 (five) minutes as needed for chest pain. 25 tablet 3   No current facility-administered medications on file prior to visit.    Medical History:  Past Medical History:  Diagnosis Date  . Arthritis   . CAD (coronary artery disease)   . Hyperlipidemia   . Hypertension   . Old myocardial infarct   . S/P CABG x 5  08/29/1984   LIMA to LAD,SVG to diagonal,SVG to OM,SVG to PDA and PLA  . Tremor, essential     Allergies:  Allergies  Allergen Reactions  . Erythromycin Nausea Only     Review of Systems:  Review of Systems  Constitutional: Negative for chills, diaphoresis, fever, malaise/fatigue and weight loss.  HENT: Negative for congestion, ear discharge, ear pain and sore throat.   Eyes: Negative.   Respiratory: Negative for cough, shortness of breath and wheezing.   Cardiovascular: Negative for chest pain, palpitations and leg swelling.  Gastrointestinal: Negative for abdominal pain, blood in stool, constipation, diarrhea, heartburn, melena, nausea and vomiting.  Genitourinary: Negative.   Musculoskeletal: Positive for myalgias (at night in feet). Negative for back pain, falls, joint pain and neck pain.  Skin: Negative.  Negative for rash.  Neurological: Positive for tingling, tremors (intention tremor) and sensory change (bilateral feet). Negative for dizziness, speech change, focal weakness, seizures, loss of consciousness, weakness and headaches.  Psychiatric/Behavioral: Positive for depression and memory loss. Negative  for hallucinations, substance abuse and suicidal ideas. The patient is nervous/anxious. The patient does not have insomnia.    Family history- Review and unchanged  Social history- Review and unchanged  Physical Exam: BP 126/80   Pulse 83   Temp 97.8 F (36.6 C)   Wt 172 lb (78 kg)   SpO2 98%   BMI 27.76 kg/m  Wt Readings from Last 3 Encounters:  07/23/19 172 lb (78 kg)  04/26/19 157 lb 9.6 oz (71.5 kg)  01/15/19 162 lb (73.5 kg)   General Appearance: Well nourished well developed, in no apparent distress. Eyes: PERRLA, EOMs, conjunctiva no swelling or erythema ENT/Mouth: Ear canals normal without obstruction, swelling, erythma, discharge.  TMs normal bilaterally.  Oropharynx moist, clear, without exudate, or postoropharyngeal swelling. Neck: Supple, thyroid normal,no cervical adenopathy  Respiratory: Respiratory effort normal, Breath sounds clear A&P without rhonchi, wheeze, or rale.  No retractions, no accessory usage. Cardio: RRR with no MRGs. Brisk peripheral pulses without edema.  Abdomen: Soft, + BS,  Non tender, no guarding, rebound, hernias, masses. Musculoskeletal: Full ROM, 4/5 strength,  Abnormal gait with imbalance.  Skin: senile purpura bilateral arms.  Warm, dry without rashes, lesions, ecchymosis.  Neuro: Awake and oriented X 3, Cranial nerves intact. Normal muscle tone, no cerebellar symptoms, minor intention tremor Psych: depressed affect, Insight and Judgment appropriate.    Vicie Mutters, PA-C 4:14 PM [facility]

## 2019-07-23 ENCOUNTER — Ambulatory Visit (INDEPENDENT_AMBULATORY_CARE_PROVIDER_SITE_OTHER): Payer: Medicare Other | Admitting: Physician Assistant

## 2019-07-23 ENCOUNTER — Encounter: Payer: Self-pay | Admitting: Physician Assistant

## 2019-07-23 ENCOUNTER — Other Ambulatory Visit: Payer: Self-pay

## 2019-07-23 VITALS — BP 126/80 | HR 83 | Temp 97.8°F | Wt 172.0 lb

## 2019-07-23 DIAGNOSIS — I4729 Other ventricular tachycardia: Secondary | ICD-10-CM

## 2019-07-23 DIAGNOSIS — M9901 Segmental and somatic dysfunction of cervical region: Secondary | ICD-10-CM | POA: Diagnosis not present

## 2019-07-23 DIAGNOSIS — I5042 Chronic combined systolic (congestive) and diastolic (congestive) heart failure: Secondary | ICD-10-CM | POA: Diagnosis not present

## 2019-07-23 DIAGNOSIS — R7309 Other abnormal glucose: Secondary | ICD-10-CM

## 2019-07-23 DIAGNOSIS — I442 Atrioventricular block, complete: Secondary | ICD-10-CM | POA: Diagnosis not present

## 2019-07-23 DIAGNOSIS — I700 Atherosclerosis of aorta: Secondary | ICD-10-CM

## 2019-07-23 DIAGNOSIS — M4727 Other spondylosis with radiculopathy, lumbosacral region: Secondary | ICD-10-CM | POA: Diagnosis not present

## 2019-07-23 DIAGNOSIS — M9903 Segmental and somatic dysfunction of lumbar region: Secondary | ICD-10-CM | POA: Diagnosis not present

## 2019-07-23 DIAGNOSIS — Z79899 Other long term (current) drug therapy: Secondary | ICD-10-CM

## 2019-07-23 DIAGNOSIS — M4722 Other spondylosis with radiculopathy, cervical region: Secondary | ICD-10-CM | POA: Diagnosis not present

## 2019-07-23 DIAGNOSIS — D6869 Other thrombophilia: Secondary | ICD-10-CM

## 2019-07-23 DIAGNOSIS — E538 Deficiency of other specified B group vitamins: Secondary | ICD-10-CM

## 2019-07-23 DIAGNOSIS — M4186 Other forms of scoliosis, lumbar region: Secondary | ICD-10-CM | POA: Diagnosis not present

## 2019-07-23 DIAGNOSIS — I10 Essential (primary) hypertension: Secondary | ICD-10-CM

## 2019-07-23 DIAGNOSIS — M9902 Segmental and somatic dysfunction of thoracic region: Secondary | ICD-10-CM | POA: Diagnosis not present

## 2019-07-23 DIAGNOSIS — M9904 Segmental and somatic dysfunction of sacral region: Secondary | ICD-10-CM | POA: Diagnosis not present

## 2019-07-23 DIAGNOSIS — I4892 Unspecified atrial flutter: Secondary | ICD-10-CM

## 2019-07-23 DIAGNOSIS — D692 Other nonthrombocytopenic purpura: Secondary | ICD-10-CM

## 2019-07-23 DIAGNOSIS — M5126 Other intervertebral disc displacement, lumbar region: Secondary | ICD-10-CM | POA: Diagnosis not present

## 2019-07-23 DIAGNOSIS — R293 Abnormal posture: Secondary | ICD-10-CM | POA: Diagnosis not present

## 2019-07-23 DIAGNOSIS — F3342 Major depressive disorder, recurrent, in full remission: Secondary | ICD-10-CM

## 2019-07-23 DIAGNOSIS — M5137 Other intervertebral disc degeneration, lumbosacral region: Secondary | ICD-10-CM | POA: Diagnosis not present

## 2019-07-23 DIAGNOSIS — E559 Vitamin D deficiency, unspecified: Secondary | ICD-10-CM

## 2019-07-23 DIAGNOSIS — E782 Mixed hyperlipidemia: Secondary | ICD-10-CM

## 2019-07-23 DIAGNOSIS — M791 Myalgia, unspecified site: Secondary | ICD-10-CM | POA: Diagnosis not present

## 2019-07-23 DIAGNOSIS — I472 Ventricular tachycardia: Secondary | ICD-10-CM

## 2019-07-23 NOTE — Patient Instructions (Addendum)
Increase gabapentin to 2-3 at a time and see how your pain does We can send in a gabapentin to 300mg  AS needed up to 3 a day if you would like Max of this medications is 3600mg  a day Can follow up with ortho  Can take the gabapentin 300mg  at night.   It can make you sleepy so we suggest trying it at night first and please plan to not drive or do anything strenuous.  Also please do not take this medication with alcohol.   Start out 1 pill at night before bed, can increase to 2 pills at night before bed. Please call the office if you have any side effects.   Common side effects are sleepiness, concentration problems, dizziness, swelling.  Do not stop abruptly unless you have a reaction to it.   [place] is now requiring appointments for testing, you should be able to request an appointment by going to [email or web address] or by texting "COVID" to [phone].  You can also call [phone] for more information about community testing.   There are also several urgent cares that are testing, the PCR send out is a better test than the rapid test.  The pt's will remain in the car and wear a mask when going for testing.The staff will come to the car to perform testing. The pt's will need to bring an ID. People are getting results in 24-72 hours.  During this time please quarantine you and your family.   [place] ([place])  [address] [facility]    To stop home isolation IF you test positive, you will need all three of the statements below.  1) You need to be fever free for 3 days WITHOUT tylenol.  2) Your symptoms such as cough, shortness of breath, diarrhea, etc need to improve.  3) It needs to be at least 10 days since your symptoms first appeared    After you stop home isolation, please continue to wear a mask in public and continue hand washing and contact precautions.    Things to do if you test positive: Please do vitamin C 1000mg  twice a day- stop if you have any worsening heart burn or diarrhea.   You can take tylenol for fevers above 101 if you feel uncomfortable. Remember a fever itself is not bad, the tylenol is more for your comfort. See the max of tylenol below.  Please do breathing exercises. Make sure that you are taking deep breaths and trying to walk around the room as much as possible.  Please lay "prone" or on your belly for up to 4 hours a day, you can split this up to 30 minute or 1 hour increments but this helps get oxygen to certain places of your lungs.  Please get on 81 mg of aspirin unless you are unable to tolerate this or have a contraindication.  Please pump your feet like your are pedaling a bike to prevent clots in your legs.   Push fluids with Gatorade or electrolyte replacement in addition to water, aim for 100-120 oz a day if no contraindications.  If you have worsening shortness of breath, unable to complete full sentences, are panting, please contact the office immediately for call 911. Let them know you are being monitored for coronavirus.

## 2019-07-24 LAB — COMPLETE METABOLIC PANEL WITH GFR
AG Ratio: 1.6 (calc) (ref 1.0–2.5)
ALT: 29 U/L (ref 9–46)
AST: 26 U/L (ref 10–35)
Albumin: 4.2 g/dL (ref 3.6–5.1)
Alkaline phosphatase (APISO): 58 U/L (ref 35–144)
BUN: 20 mg/dL (ref 7–25)
CO2: 32 mmol/L (ref 20–32)
Calcium: 9.7 mg/dL (ref 8.6–10.3)
Chloride: 102 mmol/L (ref 98–110)
Creat: 0.99 mg/dL (ref 0.70–1.11)
GFR, Est African American: 80 mL/min/{1.73_m2} (ref >=60)
GFR, Est Non African American: 69 mL/min/{1.73_m2} (ref >=60)
Globulin: 2.6 g/dL (calc) (ref 1.9–3.7)
Glucose, Bld: 110 mg/dL — ABNORMAL HIGH (ref 65–99)
Potassium: 4.6 mmol/L (ref 3.5–5.3)
Sodium: 141 mmol/L (ref 135–146)
Total Bilirubin: 0.5 mg/dL (ref 0.2–1.2)
Total Protein: 6.8 g/dL (ref 6.1–8.1)

## 2019-07-24 LAB — HEMOGLOBIN A1C
Hgb A1c MFr Bld: 5.9 % of total Hgb — ABNORMAL HIGH (ref <5.7)
Mean Plasma Glucose: 123 (calc)
eAG (mmol/L): 6.8 (calc)

## 2019-07-24 LAB — CBC WITH DIFFERENTIAL/PLATELET
Absolute Monocytes: 958 cells/uL — ABNORMAL HIGH (ref 200–950)
Basophils Absolute: 42 cells/uL (ref 0–200)
Basophils Relative: 0.5 %
Eosinophils Absolute: 218 cells/uL (ref 15–500)
Eosinophils Relative: 2.6 %
HCT: 46.1 % (ref 38.5–50.0)
Hemoglobin: 15.9 g/dL (ref 13.2–17.1)
Lymphs Abs: 2948 cells/uL (ref 850–3900)
MCH: 32.7 pg (ref 27.0–33.0)
MCHC: 34.5 g/dL (ref 32.0–36.0)
MCV: 94.9 fL (ref 80.0–100.0)
MPV: 10.7 fL (ref 7.5–12.5)
Monocytes Relative: 11.4 %
Neutro Abs: 4234 cells/uL (ref 1500–7800)
Neutrophils Relative %: 50.4 %
Platelets: 208 10*3/uL (ref 140–400)
RBC: 4.86 10*6/uL (ref 4.20–5.80)
RDW: 13.4 % (ref 11.0–15.0)
Total Lymphocyte: 35.1 %
WBC: 8.4 10*3/uL (ref 3.8–10.8)

## 2019-07-24 LAB — MAGNESIUM: Magnesium: 2 mg/dL (ref 1.5–2.5)

## 2019-07-24 LAB — LIPID PANEL
Cholesterol: 147 mg/dL (ref <200)
HDL: 51 mg/dL (ref >=40)
LDL Cholesterol (Calc): 70 mg/dL (calc)
Non-HDL Cholesterol (Calc): 96 mg/dL (calc) (ref <130)
Total CHOL/HDL Ratio: 2.9 (calc) (ref <5.0)
Triglycerides: 189 mg/dL — ABNORMAL HIGH (ref <150)

## 2019-07-24 LAB — VITAMIN D 25 HYDROXY (VIT D DEFICIENCY, FRACTURES): Vit D, 25-Hydroxy: 41 ng/mL (ref 30–100)

## 2019-07-24 LAB — VITAMIN B12: Vitamin B-12: 583 pg/mL (ref 200–1100)

## 2019-07-24 LAB — TSH: TSH: 0.36 mIU/L — ABNORMAL LOW (ref 0.40–4.50)

## 2019-07-25 DIAGNOSIS — M9902 Segmental and somatic dysfunction of thoracic region: Secondary | ICD-10-CM | POA: Diagnosis not present

## 2019-07-25 DIAGNOSIS — M4186 Other forms of scoliosis, lumbar region: Secondary | ICD-10-CM | POA: Diagnosis not present

## 2019-07-25 DIAGNOSIS — M4722 Other spondylosis with radiculopathy, cervical region: Secondary | ICD-10-CM | POA: Diagnosis not present

## 2019-07-25 DIAGNOSIS — M9903 Segmental and somatic dysfunction of lumbar region: Secondary | ICD-10-CM | POA: Diagnosis not present

## 2019-07-25 DIAGNOSIS — M4727 Other spondylosis with radiculopathy, lumbosacral region: Secondary | ICD-10-CM | POA: Diagnosis not present

## 2019-07-25 DIAGNOSIS — M791 Myalgia, unspecified site: Secondary | ICD-10-CM | POA: Diagnosis not present

## 2019-07-25 DIAGNOSIS — M9904 Segmental and somatic dysfunction of sacral region: Secondary | ICD-10-CM | POA: Diagnosis not present

## 2019-07-25 DIAGNOSIS — M9901 Segmental and somatic dysfunction of cervical region: Secondary | ICD-10-CM | POA: Diagnosis not present

## 2019-07-25 DIAGNOSIS — M5137 Other intervertebral disc degeneration, lumbosacral region: Secondary | ICD-10-CM | POA: Diagnosis not present

## 2019-07-25 DIAGNOSIS — M5126 Other intervertebral disc displacement, lumbar region: Secondary | ICD-10-CM | POA: Diagnosis not present

## 2019-07-25 DIAGNOSIS — R293 Abnormal posture: Secondary | ICD-10-CM | POA: Diagnosis not present

## 2019-07-30 DIAGNOSIS — M9904 Segmental and somatic dysfunction of sacral region: Secondary | ICD-10-CM | POA: Diagnosis not present

## 2019-07-30 DIAGNOSIS — M5137 Other intervertebral disc degeneration, lumbosacral region: Secondary | ICD-10-CM | POA: Diagnosis not present

## 2019-07-30 DIAGNOSIS — M9902 Segmental and somatic dysfunction of thoracic region: Secondary | ICD-10-CM | POA: Diagnosis not present

## 2019-07-30 DIAGNOSIS — M9903 Segmental and somatic dysfunction of lumbar region: Secondary | ICD-10-CM | POA: Diagnosis not present

## 2019-07-30 DIAGNOSIS — M4722 Other spondylosis with radiculopathy, cervical region: Secondary | ICD-10-CM | POA: Diagnosis not present

## 2019-07-30 DIAGNOSIS — M4727 Other spondylosis with radiculopathy, lumbosacral region: Secondary | ICD-10-CM | POA: Diagnosis not present

## 2019-07-30 DIAGNOSIS — R293 Abnormal posture: Secondary | ICD-10-CM | POA: Diagnosis not present

## 2019-07-30 DIAGNOSIS — M9901 Segmental and somatic dysfunction of cervical region: Secondary | ICD-10-CM | POA: Diagnosis not present

## 2019-07-30 DIAGNOSIS — M791 Myalgia, unspecified site: Secondary | ICD-10-CM | POA: Diagnosis not present

## 2019-07-30 DIAGNOSIS — M4186 Other forms of scoliosis, lumbar region: Secondary | ICD-10-CM | POA: Diagnosis not present

## 2019-07-30 DIAGNOSIS — M5126 Other intervertebral disc displacement, lumbar region: Secondary | ICD-10-CM | POA: Diagnosis not present

## 2019-07-31 ENCOUNTER — Other Ambulatory Visit: Payer: Self-pay | Admitting: Adult Health

## 2019-07-31 DIAGNOSIS — M5442 Lumbago with sciatica, left side: Secondary | ICD-10-CM

## 2019-07-31 DIAGNOSIS — G8929 Other chronic pain: Secondary | ICD-10-CM

## 2019-07-31 MED ORDER — GABAPENTIN 100 MG PO CAPS: ORAL_CAPSULE | ORAL | 1 refills | Status: DC

## 2019-08-06 DIAGNOSIS — M4727 Other spondylosis with radiculopathy, lumbosacral region: Secondary | ICD-10-CM | POA: Diagnosis not present

## 2019-08-06 DIAGNOSIS — M9904 Segmental and somatic dysfunction of sacral region: Secondary | ICD-10-CM | POA: Diagnosis not present

## 2019-08-06 DIAGNOSIS — R293 Abnormal posture: Secondary | ICD-10-CM | POA: Diagnosis not present

## 2019-08-06 DIAGNOSIS — M4722 Other spondylosis with radiculopathy, cervical region: Secondary | ICD-10-CM | POA: Diagnosis not present

## 2019-08-06 DIAGNOSIS — M9901 Segmental and somatic dysfunction of cervical region: Secondary | ICD-10-CM | POA: Diagnosis not present

## 2019-08-06 DIAGNOSIS — M791 Myalgia, unspecified site: Secondary | ICD-10-CM | POA: Diagnosis not present

## 2019-08-06 DIAGNOSIS — M5137 Other intervertebral disc degeneration, lumbosacral region: Secondary | ICD-10-CM | POA: Diagnosis not present

## 2019-08-06 DIAGNOSIS — M5126 Other intervertebral disc displacement, lumbar region: Secondary | ICD-10-CM | POA: Diagnosis not present

## 2019-08-06 DIAGNOSIS — M9903 Segmental and somatic dysfunction of lumbar region: Secondary | ICD-10-CM | POA: Diagnosis not present

## 2019-08-06 DIAGNOSIS — M4186 Other forms of scoliosis, lumbar region: Secondary | ICD-10-CM | POA: Diagnosis not present

## 2019-08-06 DIAGNOSIS — M9902 Segmental and somatic dysfunction of thoracic region: Secondary | ICD-10-CM | POA: Diagnosis not present

## 2019-08-13 DIAGNOSIS — M4727 Other spondylosis with radiculopathy, lumbosacral region: Secondary | ICD-10-CM | POA: Diagnosis not present

## 2019-08-13 DIAGNOSIS — M9902 Segmental and somatic dysfunction of thoracic region: Secondary | ICD-10-CM | POA: Diagnosis not present

## 2019-08-13 DIAGNOSIS — M9903 Segmental and somatic dysfunction of lumbar region: Secondary | ICD-10-CM | POA: Diagnosis not present

## 2019-08-13 DIAGNOSIS — M9901 Segmental and somatic dysfunction of cervical region: Secondary | ICD-10-CM | POA: Diagnosis not present

## 2019-08-13 DIAGNOSIS — M9904 Segmental and somatic dysfunction of sacral region: Secondary | ICD-10-CM | POA: Diagnosis not present

## 2019-08-13 DIAGNOSIS — M4722 Other spondylosis with radiculopathy, cervical region: Secondary | ICD-10-CM | POA: Diagnosis not present

## 2019-08-13 DIAGNOSIS — M791 Myalgia, unspecified site: Secondary | ICD-10-CM | POA: Diagnosis not present

## 2019-08-13 DIAGNOSIS — M4186 Other forms of scoliosis, lumbar region: Secondary | ICD-10-CM | POA: Diagnosis not present

## 2019-08-13 DIAGNOSIS — R293 Abnormal posture: Secondary | ICD-10-CM | POA: Diagnosis not present

## 2019-08-13 DIAGNOSIS — M5137 Other intervertebral disc degeneration, lumbosacral region: Secondary | ICD-10-CM | POA: Diagnosis not present

## 2019-08-13 DIAGNOSIS — M5126 Other intervertebral disc displacement, lumbar region: Secondary | ICD-10-CM | POA: Diagnosis not present

## 2019-08-14 DIAGNOSIS — L720 Epidermal cyst: Secondary | ICD-10-CM | POA: Diagnosis not present

## 2019-08-14 DIAGNOSIS — L821 Other seborrheic keratosis: Secondary | ICD-10-CM | POA: Diagnosis not present

## 2019-08-14 DIAGNOSIS — D1801 Hemangioma of skin and subcutaneous tissue: Secondary | ICD-10-CM | POA: Diagnosis not present

## 2019-08-14 DIAGNOSIS — D485 Neoplasm of uncertain behavior of skin: Secondary | ICD-10-CM | POA: Diagnosis not present

## 2019-08-14 DIAGNOSIS — L905 Scar conditions and fibrosis of skin: Secondary | ICD-10-CM | POA: Diagnosis not present

## 2019-08-14 DIAGNOSIS — Z85828 Personal history of other malignant neoplasm of skin: Secondary | ICD-10-CM | POA: Diagnosis not present

## 2019-08-14 DIAGNOSIS — L820 Inflamed seborrheic keratosis: Secondary | ICD-10-CM | POA: Diagnosis not present

## 2019-08-14 DIAGNOSIS — L570 Actinic keratosis: Secondary | ICD-10-CM | POA: Diagnosis not present

## 2019-08-14 DIAGNOSIS — D225 Melanocytic nevi of trunk: Secondary | ICD-10-CM | POA: Diagnosis not present

## 2019-08-14 DIAGNOSIS — L738 Other specified follicular disorders: Secondary | ICD-10-CM | POA: Diagnosis not present

## 2019-08-14 DIAGNOSIS — L814 Other melanin hyperpigmentation: Secondary | ICD-10-CM | POA: Diagnosis not present

## 2019-08-20 DIAGNOSIS — M9903 Segmental and somatic dysfunction of lumbar region: Secondary | ICD-10-CM | POA: Diagnosis not present

## 2019-08-20 DIAGNOSIS — M5126 Other intervertebral disc displacement, lumbar region: Secondary | ICD-10-CM | POA: Diagnosis not present

## 2019-08-20 DIAGNOSIS — M5137 Other intervertebral disc degeneration, lumbosacral region: Secondary | ICD-10-CM | POA: Diagnosis not present

## 2019-08-20 DIAGNOSIS — M4727 Other spondylosis with radiculopathy, lumbosacral region: Secondary | ICD-10-CM | POA: Diagnosis not present

## 2019-08-20 DIAGNOSIS — M791 Myalgia, unspecified site: Secondary | ICD-10-CM | POA: Diagnosis not present

## 2019-08-20 DIAGNOSIS — M4186 Other forms of scoliosis, lumbar region: Secondary | ICD-10-CM | POA: Diagnosis not present

## 2019-08-20 DIAGNOSIS — M9902 Segmental and somatic dysfunction of thoracic region: Secondary | ICD-10-CM | POA: Diagnosis not present

## 2019-08-20 DIAGNOSIS — M9904 Segmental and somatic dysfunction of sacral region: Secondary | ICD-10-CM | POA: Diagnosis not present

## 2019-08-20 DIAGNOSIS — R293 Abnormal posture: Secondary | ICD-10-CM | POA: Diagnosis not present

## 2019-08-20 DIAGNOSIS — M9901 Segmental and somatic dysfunction of cervical region: Secondary | ICD-10-CM | POA: Diagnosis not present

## 2019-08-20 DIAGNOSIS — M4722 Other spondylosis with radiculopathy, cervical region: Secondary | ICD-10-CM | POA: Diagnosis not present

## 2019-08-27 ENCOUNTER — Other Ambulatory Visit: Payer: Self-pay | Admitting: *Deleted

## 2019-08-27 DIAGNOSIS — M9901 Segmental and somatic dysfunction of cervical region: Secondary | ICD-10-CM | POA: Diagnosis not present

## 2019-08-27 DIAGNOSIS — M5137 Other intervertebral disc degeneration, lumbosacral region: Secondary | ICD-10-CM | POA: Diagnosis not present

## 2019-08-27 DIAGNOSIS — M5126 Other intervertebral disc displacement, lumbar region: Secondary | ICD-10-CM | POA: Diagnosis not present

## 2019-08-27 DIAGNOSIS — R293 Abnormal posture: Secondary | ICD-10-CM | POA: Diagnosis not present

## 2019-08-27 DIAGNOSIS — M9903 Segmental and somatic dysfunction of lumbar region: Secondary | ICD-10-CM | POA: Diagnosis not present

## 2019-08-27 DIAGNOSIS — M9902 Segmental and somatic dysfunction of thoracic region: Secondary | ICD-10-CM | POA: Diagnosis not present

## 2019-08-27 DIAGNOSIS — M4722 Other spondylosis with radiculopathy, cervical region: Secondary | ICD-10-CM | POA: Diagnosis not present

## 2019-08-27 DIAGNOSIS — M791 Myalgia, unspecified site: Secondary | ICD-10-CM | POA: Diagnosis not present

## 2019-08-27 DIAGNOSIS — M9904 Segmental and somatic dysfunction of sacral region: Secondary | ICD-10-CM | POA: Diagnosis not present

## 2019-08-27 DIAGNOSIS — M4727 Other spondylosis with radiculopathy, lumbosacral region: Secondary | ICD-10-CM | POA: Diagnosis not present

## 2019-08-27 DIAGNOSIS — M4186 Other forms of scoliosis, lumbar region: Secondary | ICD-10-CM | POA: Diagnosis not present

## 2019-08-27 NOTE — Patient Outreach (Addendum)
[place] [facility]) Care Management  08/27/2019  Paul Frazier. [birth year] [id number]   RN Health Coach received a return telephone call from patient.  [employer] compliance verified. Patient requested that [place] call back this evening because he has an appointment he needs to run to.   [place] Management [phone]  [phone] 4:43 pm RN telephone call to patient. He stated he is still tied up. RN Health Coach told him that he can call her back tomorrow when it is good for him.  [place] Care Management [phone]

## 2019-08-27 NOTE — Patient Outreach (Signed)
[place] [facility]) Care Management  08/27/2019  Paul Frazier. 11-07-1932 [id number]  RN Health Coach attempted follow up outreach call to patient.  Patient was unavailable. HIPPA compliance voicemail message left with return callback number.  Plan: RN will call patient again within 10 days. Unsuccessful outreach letter sent to patient  [place] Management [phone]

## 2019-09-03 ENCOUNTER — Ambulatory Visit: Payer: Medicare Other | Admitting: *Deleted

## 2019-09-10 ENCOUNTER — Other Ambulatory Visit: Payer: Self-pay | Admitting: Cardiovascular Disease

## 2019-09-11 ENCOUNTER — Other Ambulatory Visit: Payer: Self-pay | Admitting: *Deleted

## 2019-09-11 NOTE — Patient Outreach (Signed)
[place] [facility]) Care Management  09/11/2019  Paul Frazier. December 30, 1932 [id number]   Outreach attempts: Multiple attempts to establish contact with patient without success. No response from letter mailed to patient. Case is being closed at this time.  Plan: RN will close case based on inability to establish contact with patient.  [place] Care Management [phone]

## 2019-09-17 ENCOUNTER — Ambulatory Visit (INDEPENDENT_AMBULATORY_CARE_PROVIDER_SITE_OTHER): Payer: Medicare Other | Admitting: *Deleted

## 2019-09-17 DIAGNOSIS — I442 Atrioventricular block, complete: Secondary | ICD-10-CM

## 2019-09-17 LAB — CUP PACEART REMOTE DEVICE CHECK
Battery Remaining Longevity: 88 mo
Battery Voltage: 2.99 V
Brady Statistic AP VP Percent: 64.37 %
Brady Statistic AP VS Percent: 0.16 %
Brady Statistic AS VP Percent: 34.3 %
Brady Statistic AS VS Percent: 1.16 %
Brady Statistic RA Percent Paced: 65.21 %
Brady Statistic RV Percent Paced: 98.67 %
Date Time Interrogation Session: 20210301001116
Implantable Lead Implant Date: 20190822
Implantable Lead Implant Date: 20190822
Implantable Lead Location: 753859
Implantable Lead Location: 753860
Implantable Lead Model: 3830
Implantable Lead Model: 5076
Implantable Pulse Generator Implant Date: 20190822
Lead Channel Impedance Value: 361 Ohm
Lead Channel Impedance Value: 399 Ohm
Lead Channel Impedance Value: 456 Ohm
Lead Channel Impedance Value: 475 Ohm
Lead Channel Pacing Threshold Amplitude: 0.625 V
Lead Channel Pacing Threshold Amplitude: 1 V
Lead Channel Pacing Threshold Pulse Width: 0.4 ms
Lead Channel Pacing Threshold Pulse Width: 0.4 ms
Lead Channel Sensing Intrinsic Amplitude: 17.5 mV
Lead Channel Sensing Intrinsic Amplitude: 17.5 mV
Lead Channel Sensing Intrinsic Amplitude: 5.875 mV
Lead Channel Sensing Intrinsic Amplitude: 5.875 mV
Lead Channel Setting Pacing Amplitude: 1.5 V
Lead Channel Setting Pacing Amplitude: 2.5 V
Lead Channel Setting Pacing Pulse Width: 1 ms
Lead Channel Setting Sensing Sensitivity: 2.8 mV

## 2019-09-18 NOTE — Progress Notes (Signed)
PPM Remote  

## 2019-09-21 ENCOUNTER — Telehealth: Payer: Self-pay | Admitting: Cardiovascular Disease

## 2019-09-21 NOTE — Telephone Encounter (Signed)
Spoke with patient about primidone.  Per Pharmacist   "This is an old seizure medication that is now becoming rather popular to help with tremors.  I've seen it help some and do nothing for others.   Unfortunately however, he takes Eliquis, and the two medications are contra-indicated.   Primidone makes the Eliquis ineffective and increases risk of stroke.  He should talk to his doctor about any other options there might be."  Patient verbalized understanding and is going to contact his PCP about other options.

## 2019-09-21 NOTE — Telephone Encounter (Signed)
This is an old seizure medication that is now becoming rather popular to help with tremors.  I've seen it help some and do nothing for others.   Unfortunately however, he takes Eliquis, and the two medications are contra-indicated.   Primidone makes the Eliquis ineffective and increases risk of stroke.  He should talk to his doctor about any other options there might be.

## 2019-09-21 NOTE — Telephone Encounter (Signed)
New Message   Pt c/o medication issue:  1. Name of Medication: Primidone 50mg    2. How are you currently taking this medication (dosage and times per day)? Two times a day   3. Are you having a reaction (difficulty breathing--STAT)?   4. What is your medication issue? Patient is calling because he has tremors and he is wondering if this medication may help with his tremors. Please call to discuss.

## 2019-09-24 DIAGNOSIS — M19072 Primary osteoarthritis, left ankle and foot: Secondary | ICD-10-CM | POA: Diagnosis not present

## 2019-09-24 DIAGNOSIS — M65872 Other synovitis and tenosynovitis, left ankle and foot: Secondary | ICD-10-CM | POA: Diagnosis not present

## 2019-09-24 DIAGNOSIS — M79672 Pain in left foot: Secondary | ICD-10-CM | POA: Diagnosis not present

## 2019-09-24 DIAGNOSIS — M25572 Pain in left ankle and joints of left foot: Secondary | ICD-10-CM | POA: Diagnosis not present

## 2019-10-01 ENCOUNTER — Other Ambulatory Visit: Payer: Self-pay

## 2019-10-01 DIAGNOSIS — F3342 Major depressive disorder, recurrent, in full remission: Secondary | ICD-10-CM

## 2019-10-01 DIAGNOSIS — M5442 Lumbago with sciatica, left side: Secondary | ICD-10-CM

## 2019-10-01 DIAGNOSIS — I25118 Atherosclerotic heart disease of native coronary artery with other forms of angina pectoris: Secondary | ICD-10-CM

## 2019-10-01 DIAGNOSIS — G8929 Other chronic pain: Secondary | ICD-10-CM

## 2019-10-01 DIAGNOSIS — M5441 Lumbago with sciatica, right side: Secondary | ICD-10-CM

## 2019-10-01 DIAGNOSIS — I10 Essential (primary) hypertension: Secondary | ICD-10-CM

## 2019-10-01 DIAGNOSIS — R609 Edema, unspecified: Secondary | ICD-10-CM

## 2019-10-01 DIAGNOSIS — E782 Mixed hyperlipidemia: Secondary | ICD-10-CM

## 2019-10-01 DIAGNOSIS — R634 Abnormal weight loss: Secondary | ICD-10-CM

## 2019-10-01 MED ORDER — CITALOPRAM HYDROBROMIDE 40 MG PO TABS: 40.0000 mg | ORAL_TABLET | Freq: Every day | ORAL | 2 refills | Status: DC

## 2019-10-01 MED ORDER — METOPROLOL SUCCINATE ER 25 MG PO TB24: ORAL_TABLET | ORAL | 3 refills | Status: DC

## 2019-10-01 MED ORDER — HYDROCHLOROTHIAZIDE 25 MG PO TABS: ORAL_TABLET | ORAL | 1 refills | Status: DC

## 2019-10-01 MED ORDER — ATORVASTATIN CALCIUM 40 MG PO TABS: ORAL_TABLET | ORAL | 3 refills | Status: DC

## 2019-10-01 MED ORDER — GABAPENTIN 100 MG PO CAPS: ORAL_CAPSULE | ORAL | 1 refills | Status: DC

## 2019-10-01 NOTE — Telephone Encounter (Signed)
Refill request for Atorvastatin, Gabapentin and HCTZ. Needs to be sent to [facility].

## 2019-10-01 NOTE — Addendum Note (Signed)
Addended by: Chancy Hurter on: 10/01/2019 03:41 PM   Modules accepted: Orders

## 2019-10-01 NOTE — Telephone Encounter (Signed)
Add refill for Citalopram

## 2019-10-02 ENCOUNTER — Telehealth: Payer: Self-pay | Admitting: Physician Assistant

## 2019-10-02 DIAGNOSIS — M62838 Other muscle spasm: Secondary | ICD-10-CM

## 2019-10-02 MED ORDER — DIAZEPAM 5 MG PO TABS: ORAL_TABLET | ORAL | 0 refills | Status: DC

## 2019-10-02 NOTE — Telephone Encounter (Signed)
-----   Message from Elenor Quinones, [place] sent at 10/01/2019  3:29 PM EDT ----- Regarding: med refill PER PT/YELLOW NOTE:  Refill on DIAZEPAM Please & thank you!  Pharmacy: [facility]

## 2019-10-04 ENCOUNTER — Other Ambulatory Visit: Payer: Self-pay

## 2019-10-04 MED ORDER — APIXABAN 5 MG PO TABS: 5.0000 mg | ORAL_TABLET | Freq: Two times a day (BID) | ORAL | 1 refills | Status: DC

## 2019-10-04 NOTE — Telephone Encounter (Signed)
Requested Prescriptions   Pending Prescriptions Disp Refills  . losartan (COZAAR) 25 MG tablet 45 tablet 3    Sig: Take 0.5 tablets (12.5 mg total) by mouth daily.   Signed Prescriptions Disp Refills  . apixaban (ELIQUIS) 5 MG TABS tablet 180 tablet 1    Sig: Take 1 tablet (5 mg total) by mouth 2 (two) times daily.    Authorizing Provider: Sanda Klein    Ordering User: Allean Found   eliquis refill completed. Will send losartan refill to nl refill team.

## 2019-10-05 MED ORDER — LOSARTAN POTASSIUM 25 MG PO TABS: 12.5000 mg | ORAL_TABLET | Freq: Every day | ORAL | 3 refills | Status: DC

## 2019-10-08 ENCOUNTER — Other Ambulatory Visit: Payer: Self-pay | Admitting: Physician Assistant

## 2019-10-08 DIAGNOSIS — R634 Abnormal weight loss: Secondary | ICD-10-CM

## 2019-10-08 DIAGNOSIS — M25572 Pain in left ankle and joints of left foot: Secondary | ICD-10-CM | POA: Diagnosis not present

## 2019-10-08 DIAGNOSIS — M62838 Other muscle spasm: Secondary | ICD-10-CM

## 2019-10-08 DIAGNOSIS — F3342 Major depressive disorder, recurrent, in full remission: Secondary | ICD-10-CM

## 2019-10-08 DIAGNOSIS — M65872 Other synovitis and tenosynovitis, left ankle and foot: Secondary | ICD-10-CM | POA: Diagnosis not present

## 2019-10-08 MED ORDER — DIAZEPAM 5 MG PO TABS: ORAL_TABLET | ORAL | 0 refills | Status: DC

## 2019-10-08 MED ORDER — CITALOPRAM HYDROBROMIDE 40 MG PO TABS: 40.0000 mg | ORAL_TABLET | Freq: Every day | ORAL | 2 refills | Status: DC

## 2019-10-08 NOTE — Progress Notes (Signed)
Future Appointments  Date Time Provider [place]  12/04/2019  3:45 PM Liane Comber, NP [facility] None  12/19/2019 12:00 PM [facility] DEVICE REMOTES [facility]  03/17/2020  8:25 AM [facility] DEVICE REMOTES [facility]  05/28/2020  3:00 PM Unk Pinto, MD [facility] None  06/16/2020  8:25 AM [facility] DEVICE REMOTES [facility]

## 2019-11-20 DIAGNOSIS — L578 Other skin changes due to chronic exposure to nonionizing radiation: Secondary | ICD-10-CM | POA: Diagnosis not present

## 2019-11-20 DIAGNOSIS — D225 Melanocytic nevi of trunk: Secondary | ICD-10-CM | POA: Diagnosis not present

## 2019-11-20 DIAGNOSIS — L570 Actinic keratosis: Secondary | ICD-10-CM | POA: Diagnosis not present

## 2019-11-20 DIAGNOSIS — L821 Other seborrheic keratosis: Secondary | ICD-10-CM | POA: Diagnosis not present

## 2019-11-20 DIAGNOSIS — Z85828 Personal history of other malignant neoplasm of skin: Secondary | ICD-10-CM | POA: Diagnosis not present

## 2019-11-20 DIAGNOSIS — D1801 Hemangioma of skin and subcutaneous tissue: Secondary | ICD-10-CM | POA: Diagnosis not present

## 2019-11-20 DIAGNOSIS — L820 Inflamed seborrheic keratosis: Secondary | ICD-10-CM | POA: Diagnosis not present

## 2019-11-20 DIAGNOSIS — L905 Scar conditions and fibrosis of skin: Secondary | ICD-10-CM | POA: Diagnosis not present

## 2019-12-03 NOTE — Progress Notes (Addendum)
MEDICARE ANNUAL WELLNESS VISIT AND FOLLOW UP Assessment:   Atherosclerosis of aorta ([place]) Per imaging- CT ab 2014 Control blood pressure, cholesterol, glucose, increase exercise.   Nonsustained ventricular tachycardia ([place]) Follows cardiology, rate controlled today  Monitor, continue meds  Essential hypertension - continue medications, DASH diet, exercise and monitor at home. Call if greater than 130/80.  -     CBC with Differential/Platelet -     CMP/GFR -     TSH  Cardiomyopathy, ischemic Control blood pressure, cholesterol, glucose, increase exercise.   Coronary artery disease involving native coronary artery of native heart with other form of angina pectoris (HCC) Follows Dr. Sallyanne Kuster  Control blood pressure, cholesterol, glucose, increase exercise.   Basal cell carcinoma (BCC) of skin of left ear Follow up DERM  Other abnormal glucose -     Hemoglobin A1c Discussed disease progression and risks Discussed diet/exercise, weight management and risk modification A1C  Vitamin D deficiency Continue supplement  Medication management -     Magnesium  Recurrent major depressive disorder, in full remission ([place]) - continue medications, stress management techniques discussed, increase water, good sleep hygiene discussed, increase exercise, and increase veggies.  Hyperlipidemia -continue medications, check lipids, decrease fatty foods, increase activity.  -     Lipid panel  Acquired thrombophilia (HCC) R/t a. Fib; continue elequis  BMI 28 Monitor  LBBB (left bundle branch block) S/p pacemaker  Senile purpura ([place]) R/t age and elequis; protect skin, barriers and sun screen; monitor   Unsteady gait/ increased risk falls R/t spinal stenosis/chronic peripheral neuropathy, declined surgery due to age and risks Some falls last year, has benefitted from PT in the past With permission will refer back to repeat Encouraged regular exercise Fall prevention in the home  reviewed, information given    Over 30 minutes of exam, counseling, chart review, and critical decision making was performed Future Appointments  Date Time Provider [place]  12/19/2019 12:00 PM [facility] DEVICE REMOTES [facility]  03/17/2020  8:25 AM [facility] DEVICE REMOTES [facility]  05/28/2020  3:00 PM Unk Pinto, MD [facility] None  06/16/2020  8:25 AM [facility] DEVICE REMOTES [facility]    Plan:   During the course of the visit the patient was educated and counseled about appropriate screening and preventive services including:    Pneumococcal vaccine   Influenza vaccine  Prevnar 13  Td vaccine  Screening electrocardiogram  Colorectal cancer screening  Diabetes screening  Glaucoma screening  Nutrition counseling    Subjective:  Paul Frazier. is a 86 y.o. male who presents for Medicare Annual Wellness Visit and 3 month follow up for HTN, hyperlipidemia, prediabetes, and vitamin D Def.  Wife, [name], had a stroke, having some aphasia and she is now at [facility], has been able to go in and see her more frequently, trying to get her on [place]. States his daughter (step daughter) is helping a lot, they have an elder Engineer, mining.   He has history of imbalance and essential tremor (takes valium PRN sparingly), follows with Dr. Rexene Alberts. MRI showed spinal stenosis, was referred to ortho but ultimately declined surgery. He does have persistent distal neuropathy. Did PT a few years ago per the patient which was helpful.  Does report some falls this past year without notable injury.   He has history of coronary artery disease and previous bypass surgery in 1986 with subsequent graft failure and myocardial infarction, had stent in 2001. Moderate to severely depressed left ventricular systolic function with EF  around 30% and well compensated heart failure, nonsustained ventricular tachycardia, atrial flutter status  post radiofrequency ablation, paroxysmal atrial fibrillation, left bundle branch block, second-degree atrioventricular block, s/p pacemaker in August 2019. Continues to follow with Dr. Sallyanne Kuster.   His blood pressure has been controlled at home, today their BP is BP: 120/70 He does workout. He denies chest pain, shortness of breath, dizziness.   He was complaining of unintentional weight loss last year, CT abd/pelvis 12/07/2019 showed no findings to explain weight loss, but did show heterogeneous enhancement of the posterior right mid to apical prostate concerning for prostate malignancy with hx of elevated PSAs, had seen Dr. Alinda Money in the past and was referred back, but ultimately patient declined biopsy in the absence of evidence of metastatic disease, monitoring q75m.   Lab Results  Component Value Date   PSA 42.2 (H) 04/26/2019   PSA 36.6 (H) 02/02/2018   PSA 25.3 (H) 01/03/2017    BMI is Body mass index is 28.79 kg/m. Weight loss improved with stopping of wellbutrin and added celexa, and will reduced stress since wife has been in facility.  Wt Readings from Last 8 Encounters:  12/04/19 178 lb 6.4 oz (80.9 kg)  07/23/19 172 lb (78 kg)  04/26/19 157 lb 9.6 oz (71.5 kg)  01/15/19 162 lb (73.5 kg)  01/08/19 164 lb 12.8 oz (74.8 kg)  01/02/19 162 lb 3.2 oz (73.6 kg)  12/28/18 164 lb 3.2 oz (74.5 kg)  12/26/18 163 lb (73.9 kg)   He is on cholesterol medication and denies myalgias. His cholesterol is at goal. The cholesterol last visit was:   Lab Results  Component Value Date   CHOL 147 07/23/2019   HDL 51 07/23/2019   LDLCALC 70 07/23/2019   TRIG 189 (H) 07/23/2019   CHOLHDL 2.9 07/23/2019   He has been working on diet and exercise for prediabetes, and denies foot ulcerations, hyperglycemia, hypoglycemia , increased appetite, nausea, polydipsia, polyuria, visual disturbances, vomiting and weight loss. Last A1C in the office was:  Lab Results  Component Value Date   HGBA1C 5.9 (H)  07/23/2019   Last GFR Lab Results  Component Value Date   GFRNONAA 69 07/23/2019    Patient is on Vitamin D supplement.   Lab Results  Component Value Date   VD25OH 41 07/23/2019       Medication Review: Current Outpatient Medications on File Prior to Visit  Medication Sig Dispense Refill  . acetaminophen (TYLENOL) 325 MG tablet Take 650 mg by mouth every 6 (six) hours as needed.    [name] apixaban (ELIQUIS) 5 MG TABS tablet Take 1 tablet (5 mg total) by mouth 2 (two) times daily. 180 tablet 1  . atorvastatin (LIPITOR) 40 MG tablet Take 1 tablet daily for Cholesterol 90 tablet 3  . citalopram (CELEXA) 40 MG tablet Take 1 tablet (40 mg total) by mouth daily. 90 tablet 2  . co-enzyme Q-10 30 MG capsule Take 30 mg by mouth daily.     . diazepam (VALIUM) 5 MG tablet Take 1/2 to 1 tablet 2 to 3 x / day only if needed for Muscle spasms  & please try to limit to 5 days /week to avoid addiction 90 tablet 0  . gabapentin (NEURONTIN) 100 MG capsule Take 1-3 capsule 3 x /day as needed for Pain 270 capsule 1  . hydrochlorothiazide (HYDRODIURIL) 25 MG tablet TAKE 1 TABLET DAILY ONLY  IF  NEEDED  FOR  ANKLE  SWELLING 90 tablet 1  . losartan (  COZAAR) 25 MG tablet Take 0.5 tablets (12.5 mg total) by mouth daily. 45 tablet 3  . Magnesium Chloride (MAGNESIUM DR PO) Take 2 tablets by mouth daily.     . metoprolol succinate (TOPROL-XL) 25 MG 24 hr tablet Take 1 tablet Daily for BP & Heart 90 tablet 3  . Digestive Enzymes CAPS Take 1-2 capsules by mouth as needed (to aid digestive function).      No current facility-administered medications on file prior to visit.    Current Problems (verified) Patient Active Problem List   Diagnosis Date Noted  . Acquired thrombophilia ([place]) 07/23/2019  . Chronic combined systolic and diastolic heart failure ([place]) 08/29/2018  . Pacemaker 08/29/2018  . History of transient ischemic attack (TIA) 08/29/2018  . Lumbar pain 06/09/2018  . Senile purpura ([place]) 05/11/2018   . Complete heart block ([place])   . Paroxysmal atrial flutter ([place]) 03/06/2018  . Atherosclerosis of aorta ([place]) 08/23/2016  . History of basal cell cancer 08/01/2015  . Major depression in full remission ([place]) 09/11/2014  . Essential hypertension 01/01/2014  . Vitamin D deficiency 01/01/2014  . Medication management 01/01/2014  . Other abnormal glucose 01/01/2014  . Nonsustained ventricular tachycardia ([place]) 05/23/2013  . CAD (coronary artery disease) 01/08/2013  . Cardiomyopathy, ischemic 01/08/2013  . Hyperlipidemia 01/08/2013  . LBBB (left bundle branch block) 01/08/2013    Screening Tests Immunization History  Administered Date(s) Administered  . Influenza, High Dose Seasonal PF 05/14/2016, 06/08/2017, 05/11/2018  . Influenza-Unspecified 03/27/2019  . Pneumococcal Conjugate-13 05/11/2018  . Pneumococcal Polysaccharide-23 10/16/2014  . Td 07/20/2011    Preventative care: Last colonoscopy: 2016  cologuard 02/2018 DONE  Stress test 03/2018 Cath 2012 US carotid 09/2018 NM thyroid 09/2015 MRI brain 06/2017 MRI lumbar 06/2017  CT abdomen 12/2018 IMPRESSION: 1.  No CT findings of the abdomen or pelvis to explain weight loss.  2. Heterogeneous enhancement of the posterior right mid to apical prostate (series 2, image 84). This finding is concerning for prostate malignancy. If clinically appropriate, multiphasic multiparametric contrast enhanced MRI of the prostate may be used for further evaluation.  Prior vaccinations: TD or Tdap: 2013  Influenza:  2020 Pneumococcal: 2016 Prevnar13: 2019 Shingles: declines Covid 19: declines   Names of Other Physician/Practitioners you currently use: 1. [facility] here for primary care 2. Delfino Lovett, eye doctor, last visit 2020 3. Dr. Craig Staggers, dentist, getting partial done 2020  Patient Care Team: Unk Pinto, MD as PCP - General (Internal Medicine) Sanda Klein, MD as PCP  - Cardiology (Cardiology) [name] Age, MD as Attending Physician (Neurology) Sanda Klein, MD as Consulting Physician (Cardiology) Raynelle Bring, MD as Consulting Physician (Urology) Standley Brooking, LCSW as Social Worker  Allergies Allergies  Allergen Reactions  . Erythromycin Nausea Only    SURGICAL HISTORY He  has a past surgical history that includes Coronary artery bypass graft (1986); Coronary stent placement; Cholecystectomy (10/1990); Inguinal hernia repair (04/25/2012); US ECHOCARDIOGRAPHY (12/27/2008); R/P Myoview (12/27/2008); TEE without cardioversion (N/A, 03/09/2018); A-FLUTTER ABLATION (N/A, 03/09/2018); and PACEMAKER IMPLANT (N/A, 03/09/2018). FAMILY HISTORY His family history includes Cancer in his brother and father. SOCIAL HISTORY He  reports that he quit smoking about 49 years ago. He has never used smokeless tobacco. He reports that he does not drink alcohol or use drugs.  MEDICARE WELLNESS OBJECTIVES: Physical activity: Current Exercise Habits: The patient does not participate in regular exercise at present, Exercise limited by: neurologic condition(s) Cardiac risk factors: Cardiac Risk Factors include: advanced age (>58men, >  65 women);dyslipidemia;hypertension;male gender;sedentary lifestyle Depression/mood screen:   Depression screen [facility] 2/9 12/04/2019  Decreased Interest 0  Down, Depressed, Hopeless 1  PHQ - 2 Score 1  Altered sleeping 0  Tired, decreased energy 0  Change in appetite 0  Feeling bad or failure about yourself  0  Trouble concentrating 0  Moving slowly or fidgety/restless 0  Suicidal thoughts 0  PHQ-9 Score 1  Difficult doing work/chores Not difficult at all    ADLs:  In your present state of health, do you have any difficulty performing the following activities: 12/04/2019 04/28/2019  Hearing? N N  Vision? N N  Difficulty concentrating or making decisions? N N  Walking or climbing stairs? Y N  Comment chronic numbness, balance problems  -  Dressing or bathing? N N  Doing errands, shopping? N N  Some recent data might be hidden     Cognitive Testing  Alert? Yes  Normal Appearance?Yes  Oriented to person? Yes  Place? Yes   Time? Yes  Recall of three objects?  Yes  Can perform simple calculations? Yes  Displays appropriate judgment?Yes  Can read the correct time from a watch face?Yes  EOL planning: Does Patient Have a Medical Advance Directive?: Yes Type of Advance Directive: Healthcare Power of Attorney, Living will Does patient want to make changes to medical advance directive?: No - Patient declined Copy of [place] in Chart?: No - copy requested   Objective:   Today's Vitals   12/04/19 1545  BP: 120/70  Pulse: (!) 56  Temp: (!) 97.2 F (36.2 C)  SpO2: 97%  Weight: 178 lb 6.4 oz (80.9 kg)  Height: 5\' 6"  (1.676 m)  PainSc: 5   PainLoc: Buttocks   Body mass index is 28.79 kg/m.  General appearance: alert, no distress, WD/WN, male HEENT: normocephalic, sclerae anicteric, TMs pearly, nares patent, no discharge or erythema, pharynx normal Oral cavity: MMM, no lesions Neck: supple, no lymphadenopathy, no thyromegaly, no masses Heart: RRR, normal S1, S2, no murmurs Lungs: CTA bilaterally, no wheezes, rhonchi, or rales Abdomen: +bs, soft, non tender, non distended, no masses, no hepatomegaly, no splenomegaly Musculoskeletal: nontender, no swelling, no obvious deformity Extremities: no edema, no cyanosis, no clubbing. Scattered small ecchymoses to bil upper extremities.  Pulses: 2+ symmetric, upper and lower extremities, normal cap refill Neurological: alert, oriented x 3, CN2-12 intact, strength normal upper extremities and lower extremities, sensation decreased bil feet, DTRs 2+ throughout, no cerebellar signs, gait unsteady. Bilateral hand tremor.  Psychiatric: normal affect, behavior normal, pleasant   Medicare Attestation I have personally reviewed: The patient's medical and  social history Their use of alcohol, tobacco or illicit drugs Their current medications and supplements The patient's functional ability including ADLs,fall risks, home safety risks, cognitive, and hearing and visual impairment Diet and physical activities Evidence for depression or mood disorders  The patient's weight, height, BMI, and visual acuity have been recorded in the chart.  I have made referrals, counseling, and provided education to the patient based on review of the above and I have provided the patient with a written personalized care plan for preventive services.     Izora Ribas, NP   12/04/2019

## 2019-12-04 ENCOUNTER — Ambulatory Visit (INDEPENDENT_AMBULATORY_CARE_PROVIDER_SITE_OTHER): Payer: Medicare Other | Admitting: Adult Health

## 2019-12-04 ENCOUNTER — Other Ambulatory Visit: Payer: Self-pay

## 2019-12-04 ENCOUNTER — Ambulatory Visit: Payer: Medicare Other | Admitting: Physician Assistant

## 2019-12-04 ENCOUNTER — Encounter: Payer: Self-pay | Admitting: Adult Health

## 2019-12-04 VITALS — BP 120/70 | HR 56 | Temp 97.2°F | Ht 66.0 in | Wt 178.4 lb

## 2019-12-04 DIAGNOSIS — Z0001 Encounter for general adult medical examination with abnormal findings: Secondary | ICD-10-CM | POA: Diagnosis not present

## 2019-12-04 DIAGNOSIS — R2681 Unsteadiness on feet: Secondary | ICD-10-CM | POA: Insufficient documentation

## 2019-12-04 DIAGNOSIS — Z79899 Other long term (current) drug therapy: Secondary | ICD-10-CM | POA: Diagnosis not present

## 2019-12-04 DIAGNOSIS — Z0000 Encounter for general adult medical examination without abnormal findings: Secondary | ICD-10-CM

## 2019-12-04 DIAGNOSIS — I442 Atrioventricular block, complete: Secondary | ICD-10-CM

## 2019-12-04 DIAGNOSIS — I4892 Unspecified atrial flutter: Secondary | ICD-10-CM

## 2019-12-04 DIAGNOSIS — Z8673 Personal history of transient ischemic attack (TIA), and cerebral infarction without residual deficits: Secondary | ICD-10-CM | POA: Diagnosis not present

## 2019-12-04 DIAGNOSIS — I447 Left bundle-branch block, unspecified: Secondary | ICD-10-CM | POA: Diagnosis not present

## 2019-12-04 DIAGNOSIS — I472 Ventricular tachycardia: Secondary | ICD-10-CM | POA: Diagnosis not present

## 2019-12-04 DIAGNOSIS — I5042 Chronic combined systolic (congestive) and diastolic (congestive) heart failure: Secondary | ICD-10-CM

## 2019-12-04 DIAGNOSIS — I4729 Other ventricular tachycardia: Secondary | ICD-10-CM

## 2019-12-04 DIAGNOSIS — I10 Essential (primary) hypertension: Secondary | ICD-10-CM

## 2019-12-04 DIAGNOSIS — R7309 Other abnormal glucose: Secondary | ICD-10-CM | POA: Diagnosis not present

## 2019-12-04 DIAGNOSIS — Z9181 History of falling: Secondary | ICD-10-CM | POA: Insufficient documentation

## 2019-12-04 DIAGNOSIS — Z6825 Body mass index (BMI) 25.0-25.9, adult: Secondary | ICD-10-CM

## 2019-12-04 DIAGNOSIS — I25118 Atherosclerotic heart disease of native coronary artery with other forms of angina pectoris: Secondary | ICD-10-CM

## 2019-12-04 DIAGNOSIS — E782 Mixed hyperlipidemia: Secondary | ICD-10-CM

## 2019-12-04 DIAGNOSIS — Z85828 Personal history of other malignant neoplasm of skin: Secondary | ICD-10-CM

## 2019-12-04 DIAGNOSIS — D692 Other nonthrombocytopenic purpura: Secondary | ICD-10-CM

## 2019-12-04 DIAGNOSIS — E559 Vitamin D deficiency, unspecified: Secondary | ICD-10-CM

## 2019-12-04 DIAGNOSIS — D6869 Other thrombophilia: Secondary | ICD-10-CM | POA: Diagnosis not present

## 2019-12-04 DIAGNOSIS — R6889 Other general symptoms and signs: Secondary | ICD-10-CM | POA: Diagnosis not present

## 2019-12-04 DIAGNOSIS — I700 Atherosclerosis of aorta: Secondary | ICD-10-CM | POA: Diagnosis not present

## 2019-12-04 DIAGNOSIS — G608 Other hereditary and idiopathic neuropathies: Secondary | ICD-10-CM

## 2019-12-04 DIAGNOSIS — Z950 Presence of cardiac pacemaker: Secondary | ICD-10-CM

## 2019-12-04 DIAGNOSIS — I255 Ischemic cardiomyopathy: Secondary | ICD-10-CM

## 2019-12-04 DIAGNOSIS — F3342 Major depressive disorder, recurrent, in full remission: Secondary | ICD-10-CM

## 2019-12-04 MED ORDER — NITROGLYCERIN 0.4 MG SL SUBL: 0.4000 mg | SUBLINGUAL_TABLET | SUBLINGUAL | 3 refills | Status: DC | PRN

## 2019-12-04 NOTE — Patient Instructions (Addendum)
Mr. Paul Frazier , Thank you for taking time to come for your Medicare Wellness Visit. I appreciate your ongoing commitment to your health goals. Please review the following plan we discussed and let me know if I can assist you in the future.    This is a list of the screening recommended for you and due dates:  Health Maintenance  Topic Date Due  . COVID-19 Vaccine (1) Never done  . Flu Shot  02/17/2020  . Tetanus Vaccine  07/19/2021  . Pneumonia vaccines  Completed     Please follow up with Dr. Alinda Money to see when you are due to follow up    Fall Prevention in the Home, Adult Falls can cause injuries and can affect people from all age groups. There are many simple things that you can do to make your home safe and to help prevent falls. Ask for help when making these changes, if needed. What actions can I take to prevent falls? General instructions  Use good lighting in all rooms. Replace any light bulbs that burn out.  Turn on lights if it is dark. Use night-lights.  Place frequently used items in easy-to-reach places. Lower the shelves around your home if necessary.  Set up furniture so that there are clear paths around it. Avoid moving your furniture around.  Remove throw rugs and other tripping hazards from the floor.  Avoid walking on wet floors.  Fix any uneven floor surfaces.  Add color or contrast paint or tape to grab bars and handrails in your home. Place contrasting color strips on the first and last steps of stairways.  When you use a stepladder, make sure that it is completely opened and that the sides are firmly locked. Have someone hold the ladder while you are using it. Do not climb a closed stepladder.  Be aware of any and all pets. What can I do in the bathroom?      Keep the floor dry. Immediately clean up any water that spills onto the floor.  Remove soap buildup in the tub or shower on a regular basis.  Use non-skid mats or decals on the floor of the  tub or shower.  Attach bath mats securely with double-sided, non-slip rug tape.  If you need to sit down while you are in the shower, use a plastic, non-slip stool.  Install grab bars by the toilet and in the tub and shower. Do not use towel bars as grab bars. What can I do in the bedroom?  Make sure that a bedside light is easy to reach.  Do not use oversized bedding that drapes onto the floor.  Have a firm chair that has side arms to use for getting dressed. What can I do in the kitchen?  Clean up any spills right away.  If you need to reach for something above you, use a sturdy step stool that has a grab bar.  Keep electrical cables out of the way.  Do not use floor polish or wax that makes floors slippery. If you must use wax, make sure that it is non-skid floor wax. What can I do in the stairways?  Do not leave any items on the stairs.  Make sure that you have a light switch at the top of the stairs and the bottom of the stairs. Have them installed if you do not have them.  Make sure that there are handrails on both sides of the stairs. Fix handrails that are broken or loose.  Make sure that handrails are as long as the stairways.  Install non-slip stair treads on all stairs in your home.  Avoid having throw rugs at the top or bottom of stairways, or secure the rugs with carpet tape to prevent them from moving.  Choose a carpet design that does not hide the edge of steps on the stairway.  Check any carpeting to make sure that it is firmly attached to the stairs. Fix any carpet that is loose or worn. What can I do on the outside of my home?  Use bright outdoor lighting.  Regularly repair the edges of walkways and driveways and fix any cracks.  Remove high doorway thresholds.  Trim any shrubbery on the main path into your home.  Regularly check that handrails are securely fastened and in good repair. Both sides of any steps should have handrails.  Install  guardrails along the edges of any raised decks or porches.  Clear walkways of debris and clutter, including tools and rocks.  Have leaves, snow, and ice cleared regularly.  Use sand or salt on walkways during winter months.  In the garage, clean up any spills right away, including grease or oil spills. What other actions can I take?  Wear closed-toe shoes that fit well and support your feet. Wear shoes that have rubber soles or low heels.  Use mobility aids as needed, such as canes, walkers, scooters, and crutches.  Review your medicines with your health care provider. Some medicines can cause dizziness or changes in blood pressure, which increase your risk of falling. Talk with your health care provider about other ways that you can decrease your risk of falls. This may include working with a physical therapist or trainer to improve your strength, balance, and endurance. Where to find more information  Centers for Disease Control and Prevention, STEADI: WebmailGuide.co.za  [employer] on Aging: BrainJudge.co.uk Contact a health care provider if:  You are afraid of falling at home.  You feel weak, drowsy, or dizzy at home.  You fall at home. Summary  There are many simple things that you can do to make your home safe and to help prevent falls.  Ways to make your home safe include removing tripping hazards and installing grab bars in the bathroom.  Ask for help when making these changes in your home. This information is not intended to replace advice given to you by your health care provider. Make sure you discuss any questions you have with your health care provider. Document Revised: 06/17/2017 Document Reviewed: 02/17/2017 Elsevier Patient Education  2020 [employer].

## 2019-12-05 LAB — TSH: TSH: 0.48 mIU/L (ref 0.40–4.50)

## 2019-12-05 LAB — COMPLETE METABOLIC PANEL WITH GFR
AG Ratio: 1.7 (calc) (ref 1.0–2.5)
ALT: 23 U/L (ref 9–46)
AST: 24 U/L (ref 10–35)
Albumin: 4.1 g/dL (ref 3.6–5.1)
Alkaline phosphatase (APISO): 53 U/L (ref 35–144)
BUN: 21 mg/dL (ref 7–25)
CO2: 32 mmol/L (ref 20–32)
Calcium: 9.4 mg/dL (ref 8.6–10.3)
Chloride: 101 mmol/L (ref 98–110)
Creat: 1 mg/dL (ref 0.70–1.11)
GFR, Est African American: 79 mL/min/{1.73_m2} (ref >=60)
GFR, Est Non African American: 68 mL/min/{1.73_m2} (ref >=60)
Globulin: 2.4 g/dL (calc) (ref 1.9–3.7)
Glucose, Bld: 160 mg/dL — ABNORMAL HIGH (ref 65–99)
Potassium: 4 mmol/L (ref 3.5–5.3)
Sodium: 140 mmol/L (ref 135–146)
Total Bilirubin: 0.4 mg/dL (ref 0.2–1.2)
Total Protein: 6.5 g/dL (ref 6.1–8.1)

## 2019-12-05 LAB — CBC WITH DIFFERENTIAL/PLATELET
Absolute Monocytes: 910 cells/uL (ref 200–950)
Basophils Absolute: 49 cells/uL (ref 0–200)
Basophils Relative: 0.6 %
Eosinophils Absolute: 238 cells/uL (ref 15–500)
Eosinophils Relative: 2.9 %
HCT: 45.3 % (ref 38.5–50.0)
Hemoglobin: 15.3 g/dL (ref 13.2–17.1)
Lymphs Abs: 2903 cells/uL (ref 850–3900)
MCH: 32.4 pg (ref 27.0–33.0)
MCHC: 33.8 g/dL (ref 32.0–36.0)
MCV: 96 fL (ref 80.0–100.0)
MPV: 10.4 fL (ref 7.5–12.5)
Monocytes Relative: 11.1 %
Neutro Abs: 4100 cells/uL (ref 1500–7800)
Neutrophils Relative %: 50 %
Platelets: 192 10*3/uL (ref 140–400)
RBC: 4.72 10*6/uL (ref 4.20–5.80)
RDW: 13.2 % (ref 11.0–15.0)
Total Lymphocyte: 35.4 %
WBC: 8.2 10*3/uL (ref 3.8–10.8)

## 2019-12-05 LAB — LIPID PANEL
Cholesterol: 151 mg/dL (ref <200)
HDL: 51 mg/dL (ref >=40)
LDL Cholesterol (Calc): 74 mg/dL (calc)
Non-HDL Cholesterol (Calc): 100 mg/dL (calc) (ref <130)
Total CHOL/HDL Ratio: 3 (calc) (ref <5.0)
Triglycerides: 154 mg/dL — ABNORMAL HIGH (ref <150)

## 2019-12-05 LAB — MAGNESIUM: Magnesium: 1.7 mg/dL (ref 1.5–2.5)

## 2019-12-12 DIAGNOSIS — L570 Actinic keratosis: Secondary | ICD-10-CM | POA: Diagnosis not present

## 2019-12-13 ENCOUNTER — Telehealth: Payer: Self-pay | Admitting: Cardiovascular Disease

## 2019-12-13 NOTE — Telephone Encounter (Signed)
Pt called during Epic downtime in regards to appt for pacer check. Returned call, no answer, left voicemail.

## 2019-12-18 ENCOUNTER — Telehealth: Payer: Self-pay | Admitting: Cardiovascular Disease

## 2019-12-18 NOTE — Telephone Encounter (Signed)
Patient states [name] called him last week, I don't see any notes regarding this.

## 2019-12-18 NOTE — Telephone Encounter (Signed)
Returned call to patient he stated he was calling Paul Frazier back to schedule a PM check.Advised I will send message to device clinic.

## 2019-12-19 ENCOUNTER — Ambulatory Visit (INDEPENDENT_AMBULATORY_CARE_PROVIDER_SITE_OTHER): Payer: Medicare Other | Admitting: *Deleted

## 2019-12-19 DIAGNOSIS — I442 Atrioventricular block, complete: Secondary | ICD-10-CM

## 2019-12-19 DIAGNOSIS — I4892 Unspecified atrial flutter: Secondary | ICD-10-CM

## 2019-12-19 LAB — CUP PACEART REMOTE DEVICE CHECK
Battery Remaining Longevity: 83 mo
Battery Voltage: 2.98 V
Brady Statistic AP VP Percent: 71.19 %
Brady Statistic AP VS Percent: 0.22 %
Brady Statistic AS VP Percent: 26.82 %
Brady Statistic AS VS Percent: 1.77 %
Brady Statistic RA Percent Paced: 72.08 %
Brady Statistic RV Percent Paced: 98 %
Date Time Interrogation Session: 20210602020446
Implantable Lead Implant Date: 20190822
Implantable Lead Implant Date: 20190822
Implantable Lead Location: 753859
Implantable Lead Location: 753860
Implantable Lead Model: 3830
Implantable Lead Model: 5076
Implantable Pulse Generator Implant Date: 20190822
Lead Channel Impedance Value: 342 Ohm
Lead Channel Impedance Value: 380 Ohm
Lead Channel Impedance Value: 456 Ohm
Lead Channel Impedance Value: 456 Ohm
Lead Channel Pacing Threshold Amplitude: 0.625 V
Lead Channel Pacing Threshold Amplitude: 1.375 V
Lead Channel Pacing Threshold Pulse Width: 0.4 ms
Lead Channel Pacing Threshold Pulse Width: 0.4 ms
Lead Channel Sensing Intrinsic Amplitude: 6.125 mV
Lead Channel Sensing Intrinsic Amplitude: 6.125 mV
Lead Channel Sensing Intrinsic Amplitude: 7.875 mV
Lead Channel Sensing Intrinsic Amplitude: 7.875 mV
Lead Channel Setting Pacing Amplitude: 1.5 V
Lead Channel Setting Pacing Amplitude: 2.5 V
Lead Channel Setting Pacing Pulse Width: 1 ms
Lead Channel Setting Sensing Sensitivity: 2.8 mV

## 2019-12-24 ENCOUNTER — Ambulatory Visit (INDEPENDENT_AMBULATORY_CARE_PROVIDER_SITE_OTHER): Payer: Medicare Other | Admitting: Physical Therapy

## 2019-12-24 ENCOUNTER — Other Ambulatory Visit: Payer: Self-pay

## 2019-12-24 ENCOUNTER — Encounter: Payer: Self-pay | Admitting: Physical Therapy

## 2019-12-24 DIAGNOSIS — M79604 Pain in right leg: Secondary | ICD-10-CM

## 2019-12-24 DIAGNOSIS — M79605 Pain in left leg: Secondary | ICD-10-CM | POA: Diagnosis not present

## 2019-12-24 DIAGNOSIS — R296 Repeated falls: Secondary | ICD-10-CM | POA: Diagnosis not present

## 2019-12-24 DIAGNOSIS — R293 Abnormal posture: Secondary | ICD-10-CM | POA: Diagnosis not present

## 2019-12-24 DIAGNOSIS — R2681 Unsteadiness on feet: Secondary | ICD-10-CM | POA: Diagnosis not present

## 2019-12-24 DIAGNOSIS — R2689 Other abnormalities of gait and mobility: Secondary | ICD-10-CM

## 2019-12-24 DIAGNOSIS — R531 Weakness: Secondary | ICD-10-CM

## 2019-12-24 NOTE — Therapy (Signed)
[facility] [address] [place], [place], [address] Phone: [phone]   Fax:  [phone]  Physical Therapy Evaluation  Patient Details  Name: Paul Frazier. MRN: [id number] Date of Birth: 1932-11-06 Referring Provider (PT): Liane Comber, NP   Encounter Date: 12/24/2019  PT End of Session - 12/24/19 1535    Visit Number  1    Number of Visits  25    Date for PT Re-Evaluation  03/20/20    Authorization Type  Medicare A & B and AARP    PT Start Time  1287    PT Stop Time  1538    PT Time Calculation (min)  52 min    Equipment Utilized During Treatment  Gait belt    Activity Tolerance  Patient limited by pain    Behavior During Therapy  WFL for tasks assessed/performed       Past Medical History:  Diagnosis Date  . Arthritis   . CAD (coronary artery disease)   . Hyperlipidemia   . Hypertension   . Old myocardial infarct   . Right inguinal hernia 04/19/2012  . S/P CABG x 5 08/29/1984   LIMA to LAD,SVG to diagonal,SVG to OM,SVG to PDA and PLA  . Tremor, essential     Past Surgical History:  Procedure Laterality Date  . A-FLUTTER ABLATION N/A 03/09/2018   Procedure: A-FLUTTER ABLATION;  Surgeon: Evans Lance, MD;  Location: [place] [facility];  Service: Cardiovascular;  Laterality: N/A;  . CHOLECYSTECTOMY  10/1990  . CORONARY ARTERY BYPASS GRAFT  1986   x5 LIMA to LAD,SVG to diagonal,SVG to OM,SVG to PDA and PLA  . CORONARY STENT PLACEMENT    . INGUINAL HERNIA REPAIR  04/25/2012   right- laparoscopic  . PACEMAKER IMPLANT N/A 03/09/2018   Procedure: PACEMAKER IMPLANT;  Surgeon: Evans Lance, MD;  Location: [place] [facility];  Service: Cardiovascular;  Laterality: N/A;  . R/P Myoview  12/27/2008   no ischemia  . TEE WITHOUT CARDIOVERSION N/A 03/09/2018   Procedure: TRANSESOPHAGEAL ECHOCARDIOGRAM (TEE);  Surgeon: Fay Records, MD;  Location: [facility];  Service: Cardiovascular;  Laterality: N/A;  Bubble Study  . US  ECHOCARDIOGRAPHY  12/27/2008   concentric LVH,mild to mod MR, TR and pulmonary hypertension. EF 45-50%    There were no vitals filed for this visit.   Subjective Assessment - 12/24/19 1450    Subjective  This 86yo male was referred to PT with unsteady gait, peripheral sensory neuropathy, fall risk on 12/04/2019 by Liane Comber, NP. He has had multiple falls.    Pertinent History  spinal stenosis/radiculopathy, chronic peripheral neuropathy, CHF, CAD, complete heart block, pacemaker, basal cell CA, atheroscelerosis on Aorta    Patient Stated Goals  To improve balance to stop falls.    Currently in Pain?  Yes    Pain Score  8    lowest is 2-3/10 & highest 8-9/10   Pain Location  Other (Comment)   thighs   Pain Orientation  Right;Left;Posterior    Pain Descriptors / Indicators  Sharp    Pain Type  Chronic pain    Pain Radiating Towards  some times down left leg to foot    Pain Onset  More than a month ago    Pain Frequency  Intermittent    Aggravating Factors   turning in bed,    Pain Relieving Factors  medications, laying in recliner         [facility] PT Assessment - 12/24/19  1450      Assessment   Medical Diagnosis  Unsteady Gait    Referring Provider (PT)  Liane Comber, NP    Onset Date/Surgical Date  12/04/19   MD referral to PT   Hand Dominance  Right      Precautions   Precautions  Fall;ICD/Pacemaker;Other (comment)    Precaution Comments  No lifting >40#, No electical treatments      Balance Screen   Has the patient fallen in the past 6 months  Yes    How many times?  6-7   scrapes & bruises   Has the patient had a decrease in activity level because of a fear of falling?   Yes    Is the patient reluctant to leave their home because of a fear of falling?   Yes      [place]  Private residence    Living Arrangements  Alone   wife in [place] to enter    Entrance Stairs-Number of Steps  4     [place]  One level    [place] - single point;Shower seat;Grab bars - tub/shower      Prior Function   Level of Independence  Independent;Independent with household mobility without device;Independent with community mobility without device    Vocation  Retired    Leisure  used to [employer],       Posture/Postural Control   Posture/Postural Control  Postural limitations    Postural Limitations  Rounded Shoulders;Forward head;Flexed trunk      ROM / Strength   AROM / PROM / Strength  AROM;Strength      AROM   Overall AROM   Deficits    Overall AROM Comments  stands with knee & hip flexion,       Strength   Overall Strength  Deficits    Overall Strength Comments  functional strength deficits in trunk, hips, knees & ankles in standing & gait      Transfers   Transfers  Sit to Stand;Stand to Sit    Sit to Stand  5: Supervision;Without upper extremity assist;From chair/3-in-1    Stand to Sit  5: Supervision;With upper extremity assist;To chair/3-in-1      Ambulation/Gait   Ambulation/Gait  Yes    Ambulation/Gait Assistance  4: Min assist;5: Supervision   MinA with FGA activities   Ambulation/Gait Assistance Details  progressively decline in posture with gait.   thigh pain after gait.     Ambulation Distance (Feet)  200 Feet    Assistive device  None    Gait Pattern  Step-through pattern;Decreased arm swing - left;Decreased hip/knee flexion - right;Decreased hip/knee flexion - left;Right flexed knee in stance;Left flexed knee in stance;Shuffle;Antalgic;Lateral hip instability;Trunk flexed    Ambulation Surface  Level;Indoor    Gait velocity  1.08 ft/sec      Standardized Balance Assessment   Standardized Balance Assessment  Berg Balance Test;Timed Up and Go Test      Berg Balance Test   Sit to Stand  Able to stand  independently using hands    Standing Unsupported  Able to stand 2 minutes with supervision    Sitting with  Back Unsupported but Feet Supported on Floor or Stool  Able to sit safely and securely 2 minutes    Stand to Sit  Controls descent  by using hands    Transfers  Able to transfer safely, minor use of hands    Standing Unsupported with Eyes Closed  Unable to keep eyes closed 3 seconds but stays steady    Standing Unsupported with Feet Together  Needs help to attain position and unable to hold for 15 seconds    From Standing, Reach Forward with Outstretched Arm  Reaches forward but needs supervision    From Standing Position, Pick up Object from [place] to pick up shoe, needs supervision    From Standing Position, Turn to Look Behind Over each Shoulder  Needs assist to keep from losing balance and falling    Turn 360 Degrees  Needs assistance while turning    Standing Unsupported, Alternately Place Feet on Step/Stool  Needs assistance to keep from falling or unable to try    Standing Unsupported, One Foot in [employer] balance while stepping or standing    Standing on One Leg  Unable to try or needs assist to prevent fall    Total Score  22      Timed Up and Go Test   Normal TUG (seconds)  13.46   unsteadiness     Functional Gait  Assessment   Gait assessed   Yes    Gait Level Surface  Walks 20 ft, slow speed, abnormal gait pattern, evidence for imbalance or deviates 10-15 in outside of the 12 in walkway width. Requires more than 7 sec to ambulate 20 ft.    Change in Gait Speed  Makes only minor adjustments to walking speed, or accomplishes a change in speed with significant gait deviations, deviates 10-15 in outside the 12 in walkway width, or changes speed but loses balance but is able to recover and continue walking.    Gait with Horizontal Head Turns  Performs head turns with moderate changes in gait velocity, slows down, deviates 10-15 in outside 12 in walkway width but recovers, can continue to walk.    Gait with Vertical Head Turns  Performs task with moderate change in gait velocity,  slows down, deviates 10-15 in outside 12 in walkway width but recovers, can continue to walk.    Gait and Pivot Turn  Turns slowly, requires verbal cueing, or requires several small steps to catch balance following turn and stop    Step Over Obstacle  Cannot perform without assistance.    Gait with Narrow Base of Support  Ambulates less than 4 steps heel to toe or cannot perform without assistance.    Gait with Eyes Closed  Cannot walk 20 ft without assistance, severe gait deviations or imbalance, deviates greater than 15 in outside 12 in walkway width or will not attempt task.    Ambulating Backwards  Cannot walk 20 ft without assistance, severe gait deviations or imbalance, deviates greater than 15 in outside 12 in walkway width or will not attempt task.    Steps  Two feet to a stair, must use rail.    Total Score  6      Pain 9/10 after standing & gait assessments which increased sitting. PT positioned with feet on support to decrease posterior thigh pressure but no changes in pain. Standing at check-out area to schedule leaning to his left, he reported improved pain.             Objective measurements completed on examination: See above findings.  PT Short Term Goals - 12/24/19 2206      PT SHORT TERM GOAL #1   Title  Patient verbalizes & demonstrates understanding on initial HEP.  (All STGs Target Date: 01/24/2020)    Time  1    Period  Months    Status  New    Target Date  01/24/20      PT SHORT TERM GOAL #2   Title  Patient ambulates with head turns to scan environment and maintains path.    Time  1    Period  Months    Status  New    Target Date  01/24/20      PT SHORT TERM GOAL #3   Title  Patient able to stand reaching 10" anteriorly and scans right/left/up/down without loss of balance with supervision.    Time  1    Period  Months    Status  New    Target Date  01/24/20      PT SHORT TERM GOAL #4   Title  Patient reports >20%  improvement in pain with activiites.    Time  1    Period  Months    Status  New    Target Date  01/24/20        PT Long Term Goals - 12/24/19 2201      PT LONG TERM GOAL #1   Title  Patient verbalizes & demonstrates understanding of ongoing HEP / fitness plan.  (All LTGs Target Date: 03/21/2020)    Time  12    Period  Weeks    Status  New    Target Date  03/21/20      PT LONG TERM GOAL #2   Title  Berg Balance Test > 45/56    Time  12    Period  Weeks    Status  New    Target Date  03/21/20      PT LONG TERM GOAL #3   Title  Functional Gait Assessment >/= 19/30 indicating lower fall risk.    Time  12    Period  Weeks    Status  New    Target Date  03/21/20      PT LONG TERM GOAL #4   Title  Patient verbalizes & demonstrates understanding how to decrease leg pain to improve standing & gait tolerance.    Time  12    Period  Weeks    Status  New    Target Date  03/21/20      PT LONG TERM GOAL #5   Title  Patient ambulates 300' & negotiates ramps/curbs with cane or less modified independent.    Time  12    Period  Weeks    Status  New    Target Date  03/21/20             Plan - 12/24/19 2154    Clinical Impression Statement  This 87yo has had multiple falls with scrapes & bruises. He has bilateral lower extremity pain limiting mobility. He has weakness & impaired flexiblity in trunk & bilateral lower extremities impairing mobility & safety.  Berg Balance test of 22/56 indicates high fall risk with standing ADLs. Functional Gait Assessment 6/30 indicates high fall risk.    Personal Factors and Comorbidities  Age;Comorbidity 3+;Fitness;Past/Current Experience    Comorbidities  spinal stenosis/radiculopathy, chronic peripheral neuropathy, CHF, CAD, complete heart block, pacemaker, basal cell CA, atheroscelerosis on Aorta,    Examination-Activity Limitations  Locomotion Level;Stairs;Stand;Transfers  Examination-Participation Restrictions  Community Activity     Stability/Clinical Decision Making  Stable/Uncomplicated    Clinical Decision Making  Moderate    Rehab Potential  Good    PT Frequency  2x / week    PT Duration  12 weeks    PT Treatment/Interventions  ADLs/Self Care Home Management;Cryotherapy;Electrical Stimulation;Moist Heat;Ultrasound;DME Instruction;Gait training;Stair training;Functional mobility training;Therapeutic activities;Therapeutic exercise;Balance training;Neuromuscular re-education;Patient/family education;Manual techniques;Passive range of motion;Dry needling;Vestibular    PT Next Visit Plan  HEP for balance, strength & flexibility    Consulted and Agree with Plan of Care  Patient       Patient will benefit from skilled therapeutic intervention in order to improve the following deficits and impairments:  Abnormal gait, Decreased activity tolerance, Cardiopulmonary status limiting activity, Decreased balance, Decreased coordination, Decreased endurance, Decreased knowledge of use of DME, Decreased mobility, Decreased range of motion, Decreased strength, Impaired flexibility, Postural dysfunction, Pain  Visit Diagnosis: Other abnormalities of gait and mobility  Unsteadiness on feet  Abnormal posture  Weakness generalized  Repeated falls  Pain in right leg  Pain in left leg     Problem List Patient Active Problem List   Diagnosis Date Noted  . At risk for falls 12/04/2019  . Unsteady gait 12/04/2019  . Acquired thrombophilia ([place]) 07/23/2019  . Chronic combined systolic and diastolic heart failure ([place]) 08/29/2018  . Pacemaker 08/29/2018  . History of transient ischemic attack (TIA) 08/29/2018  . Lumbar pain 06/09/2018  . Senile purpura ([place]) 05/11/2018  . Complete heart block ([place])   . Paroxysmal atrial flutter ([place]) 03/06/2018  . Atherosclerosis of aorta ([place]) 08/23/2016  . History of basal cell cancer 08/01/2015  . Major depression in full remission ([place]) 09/11/2014  . Essential hypertension 01/01/2014   . Vitamin D deficiency 01/01/2014  . Medication management 01/01/2014  . Other abnormal glucose 01/01/2014  . Nonsustained ventricular tachycardia ([place]) 05/23/2013  . CAD (coronary artery disease) 01/08/2013  . Cardiomyopathy, ischemic 01/08/2013  . Hyperlipidemia 01/08/2013  . LBBB (left bundle branch block) 01/08/2013    Jamey Reas PT, DPT 12/24/2019, 10:11 PM  [facility] Physical Therapy [address] [place], [place], [address] Phone: [phone]   Fax:  [phone]  Name: Paul Frazier. MRN: [id number] Date of Birth: 01/28/1933

## 2019-12-24 NOTE — Progress Notes (Signed)
Remote pacemaker transmission.   

## 2019-12-25 ENCOUNTER — Other Ambulatory Visit: Payer: Self-pay | Admitting: Internal Medicine

## 2019-12-25 ENCOUNTER — Other Ambulatory Visit: Payer: Self-pay | Admitting: *Deleted

## 2019-12-25 MED ORDER — BUPROPION HCL ER (XL) 300 MG PO TB24: ORAL_TABLET | ORAL | 0 refills | Status: DC

## 2019-12-25 MED ORDER — NITROGLYCERIN 0.4 MG SL SUBL: 0.4000 mg | SUBLINGUAL_TABLET | SUBLINGUAL | 3 refills | Status: AC | PRN

## 2019-12-29 ENCOUNTER — Other Ambulatory Visit: Payer: Self-pay | Admitting: Internal Medicine

## 2019-12-29 MED ORDER — BUPROPION HCL ER (XL) 300 MG PO TB24: ORAL_TABLET | ORAL | 3 refills | Status: DC

## 2019-12-31 ENCOUNTER — Other Ambulatory Visit: Payer: Self-pay

## 2019-12-31 ENCOUNTER — Encounter: Payer: Medicare Other | Admitting: Physical Therapy

## 2019-12-31 ENCOUNTER — Ambulatory Visit (INDEPENDENT_AMBULATORY_CARE_PROVIDER_SITE_OTHER): Payer: Medicare Other | Admitting: Physical Therapy

## 2019-12-31 ENCOUNTER — Encounter: Payer: Self-pay | Admitting: Physical Therapy

## 2019-12-31 DIAGNOSIS — M79605 Pain in left leg: Secondary | ICD-10-CM | POA: Diagnosis not present

## 2019-12-31 DIAGNOSIS — R2681 Unsteadiness on feet: Secondary | ICD-10-CM | POA: Diagnosis not present

## 2019-12-31 DIAGNOSIS — R531 Weakness: Secondary | ICD-10-CM | POA: Diagnosis not present

## 2019-12-31 DIAGNOSIS — R293 Abnormal posture: Secondary | ICD-10-CM

## 2019-12-31 DIAGNOSIS — R2689 Other abnormalities of gait and mobility: Secondary | ICD-10-CM

## 2019-12-31 NOTE — Patient Instructions (Signed)
Access Code: [email or web address] URL: [email or web address] Date: 12/31/2019 Prepared by: [facility]  Exercises Seated Hamstring Stretch - 2 x daily - 7 x weekly - 1 sets - 1-2 reps - 20 seconds hold Seated Gastroc Stretch with Strap - 2 x daily - 7 x weekly - 1 sets - 1-2 reps - 20 seconds hold Seated Hamstring Stretch with Strap - 2 x daily - 7 x weekly - 1 sets - 1-2 reps - 20 seconds hold Wide Stance with Eyes Closed with Head Motions - 1 x daily - 5 x weekly - 3 sets - 5-10 reps - 1-2 hold Wide Stance with Eyes Closed with Head Motions - 1 x daily - 5 x weekly - 4 sets - 5-10 reps - 1-2 hold Standing Toe Raises at Chair - 1 x daily - 5 x weekly - 10 reps - 1 sets - 5 seconds hold Standing Heel Raise with Support - 1 x daily - 5 x weekly - 1 sets - 10 reps - 5 seconds hold Standing Alternating Knee Flexion - 1 x daily - 5 x weekly - 10 reps - 1 sets - 5 seconds hold Standing hip abduction alternating legs - 1 x daily - 5 x weekly - 10 reps - 1 sets - 5 seconds hold Standing hip extension alternating legs - 1 x daily - 5 x weekly - 10 reps - 1 sets - 5 seconds hold

## 2019-12-31 NOTE — Therapy (Signed)
[facility] [address] [place], [place], [address] Phone: [phone]   Fax:  [phone]  Physical Therapy Treatment  Patient Details  Name: Paul Frazier. MRN: [id number] Date of Birth: Oct 12, 1932 Referring Provider (PT): Liane Comber, NP   Encounter Date: 12/31/2019   PT End of Session - 12/31/19 1536    Visit Number 2    Number of Visits 25    Date for PT Re-Evaluation 03/20/20    Authorization Type Medicare A & B and AARP    PT Start Time 1435    PT Stop Time 1521    PT Time Calculation (min) 46 min    Equipment Utilized During Treatment Gait belt    Activity Tolerance Patient limited by pain    Behavior During Therapy WFL for tasks assessed/performed           Past Medical History:  Diagnosis Date  . Arthritis   . CAD (coronary artery disease)   . Hyperlipidemia   . Hypertension   . Old myocardial infarct   . Right inguinal hernia 04/19/2012  . S/P CABG x 5 08/29/1984   LIMA to LAD,SVG to diagonal,SVG to OM,SVG to PDA and PLA  . Tremor, essential     Past Surgical History:  Procedure Laterality Date  . A-FLUTTER ABLATION N/A 03/09/2018   Procedure: A-FLUTTER ABLATION;  Surgeon: Evans Lance, MD;  Location: [place] [facility];  Service: Cardiovascular;  Laterality: N/A;  . CHOLECYSTECTOMY  10/1990  . CORONARY ARTERY BYPASS GRAFT  1986   x5 LIMA to LAD,SVG to diagonal,SVG to OM,SVG to PDA and PLA  . CORONARY STENT PLACEMENT    . INGUINAL HERNIA REPAIR  04/25/2012   right- laparoscopic  . PACEMAKER IMPLANT N/A 03/09/2018   Procedure: PACEMAKER IMPLANT;  Surgeon: Evans Lance, MD;  Location: [place] [facility];  Service: Cardiovascular;  Laterality: N/A;  . R/P Myoview  12/27/2008   no ischemia  . TEE WITHOUT CARDIOVERSION N/A 03/09/2018   Procedure: TRANSESOPHAGEAL ECHOCARDIOGRAM (TEE);  Surgeon: Fay Records, MD;  Location: [facility];  Service: Cardiovascular;  Laterality: N/A;  Bubble Study  . US  ECHOCARDIOGRAPHY  12/27/2008   concentric LVH,mild to mod MR, TR and pulmonary hypertension. EF 45-50%    There were no vitals filed for this visit.   Subjective Assessment - 12/31/19 1437    Subjective No falls since PT evaluation. Only walking some but no exercises.    Pertinent History spinal stenosis/radiculopathy, chronic peripheral neuropathy, CHF, CAD, complete heart block, pacemaker, basal cell CA, atheroscelerosis on Aorta    Patient Stated Goals To improve balance to stop falls.    Pain Score 5     Pain Location Calf    Pain Orientation Right;Left    Pain Descriptors / Indicators Aching    Pain Type Chronic pain    Pain Onset More than a month ago    Pain Frequency Constant    Aggravating Factors  arthritis    Pain Relieving Factors tylenol & gabapentin                                  Balance Exercises - 12/31/19 1435      Balance Exercises: Standing   Standing Eyes Opened Narrow base of support (BOS);Head turns;Solid surface;5 reps;Limitations   head turns right/left, up/down & diagonals   Standing Eyes Opened Limitations standing in corner with chair back in  front: intermittent touch to walls & chair    Standing Eyes Closed Wide (BOA);Solid surface;Head turns;5 reps;Limitations   head turns right/left, up/down & diagonals   Standing Eyes Closed Limitations standing in corner with chair back in front: intermittent touch to walls & chair    Other Standing Exercises standing hip extension with chair support alternating 10 reps    Other Standing Exercises Comments Standing with back upright to wall with hip /pelvis weight shift left holding counter or chair back.  Perform when leg pain increases to decrease pain.       OTAGO PROGRAM   Ankle Movements --    Knee Flexor 5 reps   standing holding chair   Hip ABductor 5 reps   standing holding chair   Ankle Plantorflexors 20 reps, support   10 reps standing chair   Ankle Dorsiflexors 20 reps, support    10 reps standing chair            PT Education - 12/31/19 1529    Education Details initial HEP for LE flexibility, standing balance / strength    Person(s) Educated Patient    Methods Explanation;Demonstration;Tactile cues;Verbal cues;Handout    Comprehension Verbalized understanding;Returned demonstration;Verbal cues required;Tactile cues required;Need further instruction            PT Short Term Goals - 12/31/19 1540      PT SHORT TERM GOAL #1   Title Patient verbalizes & demonstrates understanding on initial HEP.  (All STGs Target Date: 01/24/2020)    Time 1    Period Months    Status On-going    Target Date 01/24/20      PT SHORT TERM GOAL #2   Title Patient ambulates with head turns to scan environment and maintains path.    Time 1    Period Months    Status On-going    Target Date 01/24/20      PT SHORT TERM GOAL #3   Title Patient able to stand reaching 10" anteriorly and scans right/left/up/down without loss of balance with supervision.    Time 1    Period Months    Status On-going    Target Date 01/24/20      PT SHORT TERM GOAL #4   Title Patient reports >20% improvement in pain with activiites.    Time 1    Period Months    Status On-going    Target Date 01/24/20      PT SHORT TERM GOAL #5   Status On-going             PT Long Term Goals - 12/31/19 1540      PT LONG TERM GOAL #1   Title Patient verbalizes & demonstrates understanding of ongoing HEP / fitness plan.  (All LTGs Target Date: 03/21/2020)    Time 12    Period Weeks    Status On-going    Target Date 03/21/20      PT LONG TERM GOAL #2   Title Berg Balance Test > 45/56    Time 12    Period Weeks    Status On-going    Target Date 03/21/20      PT LONG TERM GOAL #3   Title Functional Gait Assessment >/= 19/30 indicating lower fall risk.    Time 12    Period Weeks    Status On-going    Target Date 03/21/20      PT LONG TERM GOAL #4   Title Patient verbalizes & demonstrates  understanding how to decrease leg pain to improve standing & gait tolerance.    Time 12    Period Weeks    Status On-going    Target Date 03/21/20      PT LONG TERM GOAL #5   Title Patient ambulates 300' & negotiates ramps/curbs with cane or less modified independent.    Time 12    Period Weeks    Status On-going    Target Date 03/21/20                 Plan - 12/31/19 1542    Clinical Impression Statement PT session focused on educating patient on initial HEP. PT educated in hamstring / gastroc seated stretches and standing balance in corner with chair back for safety.  PT recommended breaking up at home with 3-4 exercises at time performed together.    Personal Factors and Comorbidities Age;Comorbidity 3+;Fitness;Past/Current Experience    Comorbidities spinal stenosis/radiculopathy, chronic peripheral neuropathy, CHF, CAD, complete heart block, pacemaker, basal cell CA, atheroscelerosis on Aorta,    Examination-Activity Limitations Locomotion Level;Stairs;Stand;Transfers    Examination-Participation Restrictions Community Activity    Stability/Clinical Decision Making Stable/Uncomplicated    Rehab Potential Good    PT Frequency 2x / week    PT Duration 12 weeks    PT Treatment/Interventions ADLs/Self Care Home Management;Cryotherapy;Electrical Stimulation;Moist Heat;Ultrasound;DME Instruction;Gait training;Stair training;Functional mobility training;Therapeutic activities;Therapeutic exercise;Balance training;Neuromuscular re-education;Patient/family education;Manual techniques;Passive range of motion;Dry needling;Vestibular    PT Next Visit Plan assess back pain issues closer, review initial HEP for balance,    Consulted and Agree with Plan of Care Patient           Patient will benefit from skilled therapeutic intervention in order to improve the following deficits and impairments:  Abnormal gait, Decreased activity tolerance, Cardiopulmonary status limiting activity,  Decreased balance, Decreased coordination, Decreased endurance, Decreased knowledge of use of DME, Decreased mobility, Decreased range of motion, Decreased strength, Impaired flexibility, Postural dysfunction, Pain  Visit Diagnosis: Other abnormalities of gait and mobility  Unsteadiness on feet  Abnormal posture  Weakness generalized  Pain in left leg     Problem List Patient Active Problem List   Diagnosis Date Noted  . At risk for falls 12/04/2019  . Unsteady gait 12/04/2019  . Acquired thrombophilia ([place]) 07/23/2019  . Chronic combined systolic and diastolic heart failure ([place]) 08/29/2018  . Pacemaker 08/29/2018  . History of transient ischemic attack (TIA) 08/29/2018  . Lumbar pain 06/09/2018  . Senile purpura ([place]) 05/11/2018  . Complete heart block ([place])   . Paroxysmal atrial flutter ([place]) 03/06/2018  . Atherosclerosis of aorta ([place]) 08/23/2016  . History of basal cell cancer 08/01/2015  . Major depression in full remission ([place]) 09/11/2014  . Essential hypertension 01/01/2014  . Vitamin D deficiency 01/01/2014  . Medication management 01/01/2014  . Other abnormal glucose 01/01/2014  . Nonsustained ventricular tachycardia ([place]) 05/23/2013  . CAD (coronary artery disease) 01/08/2013  . Cardiomyopathy, ischemic 01/08/2013  . Hyperlipidemia 01/08/2013  . LBBB (left bundle branch block) 01/08/2013    Jamey Reas PT, DPT 12/31/2019, 3:44 PM  [facility] [address] [place], [place], [address] Phone: [phone]   Fax:  [phone]  Name: Paul Frazier. MRN: [id number] Date of Birth: Jan 29, 1933

## 2020-01-02 ENCOUNTER — Encounter: Payer: Self-pay | Admitting: Rehabilitative and Restorative Service Providers"

## 2020-01-02 ENCOUNTER — Ambulatory Visit (INDEPENDENT_AMBULATORY_CARE_PROVIDER_SITE_OTHER): Payer: Medicare Other | Admitting: Rehabilitative and Restorative Service Providers"

## 2020-01-02 ENCOUNTER — Other Ambulatory Visit: Payer: Self-pay

## 2020-01-02 DIAGNOSIS — R2689 Other abnormalities of gait and mobility: Secondary | ICD-10-CM | POA: Diagnosis not present

## 2020-01-02 DIAGNOSIS — R531 Weakness: Secondary | ICD-10-CM | POA: Diagnosis not present

## 2020-01-02 DIAGNOSIS — R2681 Unsteadiness on feet: Secondary | ICD-10-CM | POA: Diagnosis not present

## 2020-01-02 DIAGNOSIS — R296 Repeated falls: Secondary | ICD-10-CM

## 2020-01-02 NOTE — Therapy (Signed)
[facility] [address] [place], [place], [address] Phone: [phone]   Fax:  [phone]  Physical Therapy Treatment  Patient Details  Name: Paul Frazier. MRN: [id number] Date of Birth: 1933-03-28 Referring Provider (PT): Liane Comber, NP   Encounter Date: 01/02/2020   PT End of Session - 01/02/20 1535    Visit Number 3    Number of Visits 25    Date for PT Re-Evaluation 03/20/20    Authorization Type Medicare A & B and AARP    PT Start Time 1440    PT Stop Time 1535    PT Time Calculation (min) 55 min    Equipment Utilized During Treatment Gait belt    Activity Tolerance No increased pain;Patient limited by fatigue    Behavior During Therapy [facility] for tasks assessed/performed           Past Medical History:  Diagnosis Date  . Arthritis   . CAD (coronary artery disease)   . Hyperlipidemia   . Hypertension   . Old myocardial infarct   . Right inguinal hernia 04/19/2012  . S/P CABG x 5 08/29/1984   LIMA to LAD,SVG to diagonal,SVG to OM,SVG to PDA and PLA  . Tremor, essential     Past Surgical History:  Procedure Laterality Date  . A-FLUTTER ABLATION N/A 03/09/2018   Procedure: A-FLUTTER ABLATION;  Surgeon: Evans Lance, MD;  Location: [place] [facility];  Service: Cardiovascular;  Laterality: N/A;  . CHOLECYSTECTOMY  10/1990  . CORONARY ARTERY BYPASS GRAFT  1986   x5 LIMA to LAD,SVG to diagonal,SVG to OM,SVG to PDA and PLA  . CORONARY STENT PLACEMENT    . INGUINAL HERNIA REPAIR  04/25/2012   right- laparoscopic  . PACEMAKER IMPLANT N/A 03/09/2018   Procedure: PACEMAKER IMPLANT;  Surgeon: Evans Lance, MD;  Location: [place] [facility];  Service: Cardiovascular;  Laterality: N/A;  . R/P Myoview  12/27/2008   no ischemia  . TEE WITHOUT CARDIOVERSION N/A 03/09/2018   Procedure: TRANSESOPHAGEAL ECHOCARDIOGRAM (TEE);  Surgeon: Fay Records, MD;  Location: [facility];  Service: Cardiovascular;  Laterality: N/A;  Bubble  Study  . US ECHOCARDIOGRAPHY  12/27/2008   concentric LVH,mild to mod MR, TR and pulmonary hypertension. EF 45-50%    There were no vitals filed for this visit.   Subjective Assessment - 01/02/20 1440    Subjective Dariusz notes unsteadiness with standing and walking.    Pertinent History spinal stenosis/radiculopathy, chronic peripheral neuropathy, CHF, CAD, complete heart block, pacemaker, basal cell CA, atheroscelerosis on Aorta    Patient Stated Goals To improve balance to stop falls.    Currently in Pain? No/denies    Pain Onset More than a month ago                             [facility] Adult PT Treatment/Exercise - 01/02/20 0001      Therapeutic Activites    Therapeutic Activities Other Therapeutic Activities   Sit/stand; HT Raises; Marching; 360 turns; tap-ups; obstacle     Neuro Re-ed    Neuro Re-ed Details  Berg drills: tandem balance (wide stance); feet together eyes open; feet wider eyes closed.                   PT Education - 01/02/20 1546    Education Details Encouraged participation with new functional exercises along with previously prescribed program.    Person(s) Educated  Patient    Methods Explanation;Demonstration;Tactile cues;Verbal cues;Handout    Comprehension Verbalized understanding;Tactile cues required;Returned demonstration;Need further instruction;Verbal cues required            PT Short Term Goals - 12/31/19 1540      PT SHORT TERM GOAL #1   Title Patient verbalizes & demonstrates understanding on initial HEP.  (All STGs Target Date: 01/24/2020)    Time 1    Period Months    Status On-going    Target Date 01/24/20      PT SHORT TERM GOAL #2   Title Patient ambulates with head turns to scan environment and maintains path.    Time 1    Period Months    Status On-going    Target Date 01/24/20      PT SHORT TERM GOAL #3   Title Patient able to stand reaching 10" anteriorly and scans right/left/up/down without loss of  balance with supervision.    Time 1    Period Months    Status On-going    Target Date 01/24/20      PT SHORT TERM GOAL #4   Title Patient reports >20% improvement in pain with activiites.    Time 1    Period Months    Status On-going    Target Date 01/24/20      PT SHORT TERM GOAL #5   Status On-going             PT Long Term Goals - 12/31/19 1540      PT LONG TERM GOAL #1   Title Patient verbalizes & demonstrates understanding of ongoing HEP / fitness plan.  (All LTGs Target Date: 03/21/2020)    Time 12    Period Weeks    Status On-going    Target Date 03/21/20      PT LONG TERM GOAL #2   Title Berg Balance Test > 45/56    Time 12    Period Weeks    Status On-going    Target Date 03/21/20      PT LONG TERM GOAL #3   Title Functional Gait Assessment >/= 19/30 indicating lower fall risk.    Time 12    Period Weeks    Status On-going    Target Date 03/21/20      PT LONG TERM GOAL #4   Title Patient verbalizes & demonstrates understanding how to decrease leg pain to improve standing & gait tolerance.    Time 12    Period Weeks    Status On-going    Target Date 03/21/20      PT LONG TERM GOAL #5   Title Patient ambulates 300' & negotiates ramps/curbs with cane or less modified independent.    Time 12    Period Weeks    Status On-going    Target Date 03/21/20                 Plan - 01/02/20 1540    Clinical Impression Statement Crosley lost his balance several times with eyes closed activities.  He has difficulty with narrow stance and is unable to raise up on the balls of his feet due to calf muscle weakness.  All impairments are being addressed with his original and new HEP exercises added today.  Keondre will need skilled care as he is a high risk for future falls and lost his balance several times with PT today working on basic functional activities (turning, eyes closed, etc...).    Personal Factors  and Comorbidities Age;Comorbidity  3+;Fitness;Past/Current Experience    Comorbidities spinal stenosis/radiculopathy, chronic peripheral neuropathy, CHF, CAD, complete heart block, pacemaker, basal cell CA, atheroscelerosis on Aorta,    Examination-Activity Limitations Locomotion Level;Stairs;Stand;Transfers    Examination-Participation Restrictions Community Activity    Stability/Clinical Decision Making Stable/Uncomplicated    Rehab Potential Good    PT Frequency 2x / week    PT Duration 12 weeks    PT Treatment/Interventions ADLs/Self Care Home Management;Cryotherapy;Electrical Stimulation;Moist Heat;Ultrasound;DME Instruction;Gait training;Stair training;Functional mobility training;Therapeutic activities;Therapeutic exercise;Balance training;Neuromuscular re-education;Patient/family education;Manual techniques;Passive range of motion;Dry needling;Vestibular    PT Next Visit Plan assess back pain issues closer, review initial HEP for balance,    PT Home Exercise Plan Access Code: [email or web address]    Consulted and Agree with Plan of Care Patient           Patient will benefit from skilled therapeutic intervention in order to improve the following deficits and impairments:  Abnormal gait, Decreased activity tolerance, Cardiopulmonary status limiting activity, Decreased balance, Decreased coordination, Decreased endurance, Decreased knowledge of use of DME, Decreased mobility, Decreased range of motion, Decreased strength, Impaired flexibility, Postural dysfunction, Pain  Visit Diagnosis: Other abnormalities of gait and mobility  Unsteadiness on feet  Weakness generalized  Repeated falls     Problem List Patient Active Problem List   Diagnosis Date Noted  . At risk for falls 12/04/2019  . Unsteady gait 12/04/2019  . Acquired thrombophilia ([place]) 07/23/2019  . Chronic combined systolic and diastolic heart failure ([place]) 08/29/2018  . Pacemaker 08/29/2018  . History of transient ischemic attack (TIA) 08/29/2018  . Lumbar  pain 06/09/2018  . Senile purpura ([place]) 05/11/2018  . Complete heart block ([place])   . Paroxysmal atrial flutter ([place]) 03/06/2018  . Atherosclerosis of aorta ([place]) 08/23/2016  . History of basal cell cancer 08/01/2015  . Major depression in full remission ([place]) 09/11/2014  . Essential hypertension 01/01/2014  . Vitamin D deficiency 01/01/2014  . Medication management 01/01/2014  . Other abnormal glucose 01/01/2014  . Nonsustained ventricular tachycardia ([place]) 05/23/2013  . CAD (coronary artery disease) 01/08/2013  . Cardiomyopathy, ischemic 01/08/2013  . Hyperlipidemia 01/08/2013  . LBBB (left bundle branch block) 01/08/2013    Farley Ly PT, MPT 01/02/2020, 3:51 PM  [facility] [address] [place], [place], [address] Phone: [phone]   Fax:  [phone]  Name: Mateo Overbeck. MRN: [id number] Date of Birth: 29-Oct-1932

## 2020-01-02 NOTE — Patient Instructions (Signed)
Access Code: [email or web address] URL: [email or web address] Date: 01/02/2020 Prepared by: Vista Mink  Exercises Heel Toe Raises with Counter Support - 5 x daily - 7 x weekly - 1 sets - 5 reps - 3 seconds hold Seated Heel Toe Raises - 5 x daily - 7 x weekly - 1 sets - 5 reps - 3 seconds hold Sit to Stand - 2 x daily - 7 x weekly - 1 sets - 5 reps Tandem Stance - 2 x daily - 7 x weekly - 1 sets - 4 reps - 20 seconds hold

## 2020-01-07 ENCOUNTER — Ambulatory Visit (INDEPENDENT_AMBULATORY_CARE_PROVIDER_SITE_OTHER): Payer: Medicare Other | Admitting: Physical Therapy

## 2020-01-07 ENCOUNTER — Other Ambulatory Visit: Payer: Self-pay

## 2020-01-07 ENCOUNTER — Encounter: Payer: Self-pay | Admitting: Physical Therapy

## 2020-01-07 DIAGNOSIS — Z9181 History of falling: Secondary | ICD-10-CM

## 2020-01-07 DIAGNOSIS — M79605 Pain in left leg: Secondary | ICD-10-CM

## 2020-01-07 DIAGNOSIS — R2681 Unsteadiness on feet: Secondary | ICD-10-CM | POA: Diagnosis not present

## 2020-01-07 DIAGNOSIS — R293 Abnormal posture: Secondary | ICD-10-CM

## 2020-01-07 DIAGNOSIS — R531 Weakness: Secondary | ICD-10-CM | POA: Diagnosis not present

## 2020-01-07 DIAGNOSIS — R2689 Other abnormalities of gait and mobility: Secondary | ICD-10-CM

## 2020-01-07 DIAGNOSIS — M79604 Pain in right leg: Secondary | ICD-10-CM

## 2020-01-07 NOTE — Therapy (Signed)
[facility] Physical Therapy [address] [place], [place], [address] Phone: [phone]   Fax:  [phone]  Physical Therapy Treatment  Patient Details  Name: Paul Frazier. MRN: [id number] Date of Birth: 01-06-1933 Referring Provider (PT): Liane Comber, NP   Encounter Date: 01/07/2020   PT End of Session - 01/07/20 2118    Visit Number 4    Number of Visits 25    Date for PT Re-Evaluation 03/20/20    Authorization Type Medicare A & B and AARP    PT Start Time 9741    PT Stop Time 1517    PT Time Calculation (min) 41 min    Equipment Utilized During Treatment Gait belt    Activity Tolerance No increased pain;Patient limited by fatigue    Behavior During Therapy [facility] for tasks assessed/performed           Past Medical History:  Diagnosis Date  . Arthritis   . CAD (coronary artery disease)   . Hyperlipidemia   . Hypertension   . Old myocardial infarct   . Right inguinal hernia 04/19/2012  . S/P CABG x 5 08/29/1984   LIMA to LAD,SVG to diagonal,SVG to OM,SVG to PDA and PLA  . Tremor, essential     Past Surgical History:  Procedure Laterality Date  . A-FLUTTER ABLATION N/A 03/09/2018   Procedure: A-FLUTTER ABLATION;  Surgeon: Evans Lance, MD;  Location: [place] [facility];  Service: Cardiovascular;  Laterality: N/A;  . CHOLECYSTECTOMY  10/1990  . CORONARY ARTERY BYPASS GRAFT  1986   x5 LIMA to LAD,SVG to diagonal,SVG to OM,SVG to PDA and PLA  . CORONARY STENT PLACEMENT    . INGUINAL HERNIA REPAIR  04/25/2012   right- laparoscopic  . PACEMAKER IMPLANT N/A 03/09/2018   Procedure: PACEMAKER IMPLANT;  Surgeon: Evans Lance, MD;  Location: [place] [facility];  Service: Cardiovascular;  Laterality: N/A;  . R/P Myoview  12/27/2008   no ischemia  . TEE WITHOUT CARDIOVERSION N/A 03/09/2018   Procedure: TRANSESOPHAGEAL ECHOCARDIOGRAM (TEE);  Surgeon: Fay Records, MD;  Location: [facility] ENDOSCOPY;  Service: Cardiovascular;  Laterality: N/A;  Bubble  Study  . US ECHOCARDIOGRAPHY  12/27/2008   concentric LVH,mild to mod MR, TR and pulmonary hypertension. EF 45-50%    There were no vitals filed for this visit.   Subjective Assessment - 01/07/20 1438    Subjective He fell once since last session when bending over to pick weed. Denies injuries.    Pertinent History spinal stenosis/radiculopathy, chronic peripheral neuropathy, CHF, CAD, complete heart block, pacemaker, basal cell CA, atheroscelerosis on Aorta    Patient Stated Goals To improve balance to stop falls.    Currently in Pain? Yes    Pain Score 4     Pain Location Other (Comment)   thighs   Pain Orientation Posterior;Left;Right    Pain Descriptors / Indicators Throbbing    Pain Type Chronic pain    Pain Onset More than a month ago    Pain Frequency Intermittent    Aggravating Factors  standing & walking    Pain Relieving Factors medication, sitting or laying down           See pt education  Access Code: [email or web address] URL: [email or web address] Date: 01/07/2020 Prepared by: [facility] - Outpatient Rehab Neuro  3 Exercise Groups  1.Seated at table group - Do daily after breakfast & lunch Seated Hamstring Stretch - 2 x daily - 7 x weekly - 1 sets - 1-2 reps -  20 seconds hold Seated Gastroc Stretch with Strap - 2 x daily - 7 x weekly - 1 sets - 1-2 reps - 20 seconds hold Seated Hamstring Stretch with Strap - 2 x daily - 7 x weekly - 1 sets - 1-2 reps - 20 seconds hold Sit to Stand - 1 x daily - 7 x weekly - 1 sets - 10 reps - 5 seconds hold  2. Standing in corner with chair back in front - Do Monday, Wednesday & Friday Narrow stance head motions - 1 x daily - 5 x weekly - 1 sets - 5-10 reps - 1-2 seconds hold Wide Stance with Eyes Closed with Head Motions - 1 x daily - 5 x weekly - 4 sets - 5-10 reps - 1-2 hold Standing Toe Raises at Chair - 1 x daily - 5 x weekly - 10 reps - 1 sets - 5 seconds hold Standing Heel Raise with Support - 1 x daily - 5 x weekly - 1 sets  - 10 reps - 5 seconds hold  3. Standing at counter - Do Tuesday, Thursday & Saturday Standing Alternating Knee Flexion - 1 x daily - 5 x weekly - 10 reps - 1 sets - 5 seconds hold Standing hip abduction alternating legs - 1 x daily - 5 x weekly - 10 reps - 1 sets - 5 seconds hold Standing hip extension alternating legs - 1 x daily - 5 x weekly - 10 reps - 1 sets - 5 seconds hold                            PT Education - 01/07/20 1530    Education Details updated HEP Medbridge [email or web address]    Person(s) Educated Patient    Methods Explanation;Demonstration;Tactile cues;Verbal cues;Handout    Comprehension Verbalized understanding;Returned demonstration            PT Short Term Goals - 12/31/19 1540      PT SHORT TERM GOAL #1   Title Patient verbalizes & demonstrates understanding on initial HEP.  (All STGs Target Date: 01/24/2020)    Time 1    Period Months    Status On-going    Target Date 01/24/20      PT SHORT TERM GOAL #2   Title Patient ambulates with head turns to scan environment and maintains path.    Time 1    Period Months    Status On-going    Target Date 01/24/20      PT SHORT TERM GOAL #3   Title Patient able to stand reaching 10" anteriorly and scans right/left/up/down without loss of balance with supervision.    Time 1    Period Months    Status On-going    Target Date 01/24/20      PT SHORT TERM GOAL #4   Title Patient reports >20% improvement in pain with activiites.    Time 1    Period Months    Status On-going    Target Date 01/24/20      PT SHORT TERM GOAL #5   Status On-going             PT Long Term Goals - 12/31/19 1540      PT LONG TERM GOAL #1   Title Patient verbalizes & demonstrates understanding of ongoing HEP / fitness plan.  (All LTGs Target Date: 03/21/2020)    Time 12    Period Weeks    Status  On-going    Target Date 03/21/20      PT LONG TERM GOAL #2   Title Berg Balance Test > 45/56    Time 12     Period Weeks    Status On-going    Target Date 03/21/20      PT LONG TERM GOAL #3   Title Functional Gait Assessment >/= 19/30 indicating lower fall risk.    Time 12    Period Weeks    Status On-going    Target Date 03/21/20      PT LONG TERM GOAL #4   Title Patient verbalizes & demonstrates understanding how to decrease leg pain to improve standing & gait tolerance.    Time 12    Period Weeks    Status On-going    Target Date 03/21/20      PT LONG TERM GOAL #5   Title Patient ambulates 300' & negotiates ramps/curbs with cane or less modified independent.    Time 12    Period Weeks    Status On-going    Target Date 03/21/20                 Plan - 01/07/20 1527    Clinical Impression Statement PT broke initial HEP into 3 groups by location with 3-4 exercises / group. PT recommended doing stretches & sit/stand daily 2x/day. Corner balance program M, W & F 1x/day    Personal Factors and Comorbidities Age;Comorbidity 3+;Fitness;Past/Current Experience    Comorbidities spinal stenosis/radiculopathy, chronic peripheral neuropathy, CHF, CAD, complete heart block, pacemaker, basal cell CA, atheroscelerosis on Aorta,    Examination-Activity Limitations Locomotion Level;Stairs;Stand;Transfers    Examination-Participation Restrictions Community Activity    Stability/Clinical Decision Making Stable/Uncomplicated    Rehab Potential Good    PT Frequency 2x / week    PT Duration 12 weeks    PT Treatment/Interventions ADLs/Self Care Home Management;Cryotherapy;Electrical Stimulation;Moist Heat;Ultrasound;DME Instruction;Gait training;Stair training;Functional mobility training;Therapeutic activities;Therapeutic exercise;Balance training;Neuromuscular re-education;Patient/family education;Manual techniques;Passive range of motion;Dry needling;Vestibular    PT Next Visit Plan Back pain assessment, therapeutic exercises & activities for balance & strength.    PT Home Exercise Plan Access  Code: [email or web address]    Consulted and Agree with Plan of Care Patient           Patient will benefit from skilled therapeutic intervention in order to improve the following deficits and impairments:  Abnormal gait, Decreased activity tolerance, Cardiopulmonary status limiting activity, Decreased balance, Decreased coordination, Decreased endurance, Decreased knowledge of use of DME, Decreased mobility, Decreased range of motion, Decreased strength, Impaired flexibility, Postural dysfunction, Pain  Visit Diagnosis: Other abnormalities of gait and mobility  Unsteadiness on feet  Abnormal posture  Weakness generalized  History of fall  Pain in left leg  Pain in right leg     Problem List Patient Active Problem List   Diagnosis Date Noted  . At risk for falls 12/04/2019  . Unsteady gait 12/04/2019  . Acquired thrombophilia ([place]) 07/23/2019  . Chronic combined systolic and diastolic heart failure ([place]) 08/29/2018  . Pacemaker 08/29/2018  . History of transient ischemic attack (TIA) 08/29/2018  . Lumbar pain 06/09/2018  . Senile purpura ([place]) 05/11/2018  . Complete heart block ([place])   . Paroxysmal atrial flutter ([place]) 03/06/2018  . Atherosclerosis of aorta ([place]) 08/23/2016  . History of basal cell cancer 08/01/2015  . Major depression in full remission ([place]) 09/11/2014  . Essential hypertension 01/01/2014  . Vitamin D deficiency 01/01/2014  . Medication management 01/01/2014  . Other  abnormal glucose 01/01/2014  . Nonsustained ventricular tachycardia ([place]) 05/23/2013  . CAD (coronary artery disease) 01/08/2013  . Cardiomyopathy, ischemic 01/08/2013  . Hyperlipidemia 01/08/2013  . LBBB (left bundle branch block) 01/08/2013    Jamey Reas PT, DPT 01/07/2020, 9:20 PM  [facility] Physical Therapy [address] [place], [place], [address] Phone: [phone]   Fax:  [phone]  Name: Paul Frazier. MRN: [id number] Date of Birth: January 19, 1933

## 2020-01-07 NOTE — Patient Instructions (Signed)
Access Code: [email or web address] URL: [email or web address] Date: 01/07/2020 Prepared by: [facility] - Outpatient Rehab Neuro  Exercises Seated Hamstring Stretch - 2 x daily - 7 x weekly - 1 sets - 1-2 reps - 20 seconds hold Seated Gastroc Stretch with Strap - 2 x daily - 7 x weekly - 1 sets - 1-2 reps - 20 seconds hold Seated Hamstring Stretch with Strap - 2 x daily - 7 x weekly - 1 sets - 1-2 reps - 20 seconds hold Sit to Stand - 1 x daily - 7 x weekly - 1 sets - 10 reps - 5 seconds hold Narrow stance head motions - 1 x daily - 5 x weekly - 1 sets - 5-10 reps - 1-2 seconds hold Wide Stance with Eyes Closed with Head Motions - 1 x daily - 5 x weekly - 4 sets - 5-10 reps - 1-2 hold Standing Toe Raises at Chair - 1 x daily - 5 x weekly - 10 reps - 1 sets - 5 seconds hold Standing Heel Raise with Support - 1 x daily - 5 x weekly - 1 sets - 10 reps - 5 seconds hold Standing Alternating Knee Flexion - 1 x daily - 5 x weekly - 10 reps - 1 sets - 5 seconds hold Standing hip abduction alternating legs - 1 x daily - 5 x weekly - 10 reps - 1 sets - 5 seconds hold Standing hip extension alternating legs - 1 x daily - 5 x weekly - 10 reps - 1 sets - 5 seconds hold

## 2020-01-09 ENCOUNTER — Encounter: Payer: Self-pay | Admitting: Physical Therapy

## 2020-01-09 ENCOUNTER — Ambulatory Visit (INDEPENDENT_AMBULATORY_CARE_PROVIDER_SITE_OTHER): Payer: Medicare Other | Admitting: Physical Therapy

## 2020-01-09 ENCOUNTER — Other Ambulatory Visit: Payer: Self-pay

## 2020-01-09 DIAGNOSIS — R2689 Other abnormalities of gait and mobility: Secondary | ICD-10-CM | POA: Diagnosis not present

## 2020-01-09 DIAGNOSIS — R293 Abnormal posture: Secondary | ICD-10-CM

## 2020-01-09 DIAGNOSIS — M79604 Pain in right leg: Secondary | ICD-10-CM

## 2020-01-09 DIAGNOSIS — R2681 Unsteadiness on feet: Secondary | ICD-10-CM | POA: Diagnosis not present

## 2020-01-09 DIAGNOSIS — R531 Weakness: Secondary | ICD-10-CM

## 2020-01-09 DIAGNOSIS — Z9181 History of falling: Secondary | ICD-10-CM

## 2020-01-09 DIAGNOSIS — M79605 Pain in left leg: Secondary | ICD-10-CM

## 2020-01-09 NOTE — Therapy (Signed)
[facility] Physical Therapy [address] [place], [place], [address] Phone: [phone]   Fax:  [phone]  Physical Therapy Treatment  Patient Details  Name: Paul Frazier. MRN: [id number] Date of Birth: 04/17/1933 Referring Provider (PT): Liane Comber, NP   Encounter Date: 01/09/2020   PT End of Session - 01/09/20 1535    Visit Number 5    Number of Visits 25    Date for PT Re-Evaluation 03/20/20    Authorization Type Medicare A & B and AARP    PT Start Time 0867    PT Stop Time 1537    PT Time Calculation (min) 40 min    Equipment Utilized During Treatment Gait belt    Activity Tolerance No increased pain;Patient limited by fatigue    Behavior During Therapy [facility] for tasks assessed/performed           Past Medical History:  Diagnosis Date  . Arthritis   . CAD (coronary artery disease)   . Hyperlipidemia   . Hypertension   . Old myocardial infarct   . Right inguinal hernia 04/19/2012  . S/P CABG x 5 08/29/1984   LIMA to LAD,SVG to diagonal,SVG to OM,SVG to PDA and PLA  . Tremor, essential     Past Surgical History:  Procedure Laterality Date  . A-FLUTTER ABLATION N/A 03/09/2018   Procedure: A-FLUTTER ABLATION;  Surgeon: Evans Lance, MD;  Location: [place] [facility];  Service: Cardiovascular;  Laterality: N/A;  . CHOLECYSTECTOMY  10/1990  . CORONARY ARTERY BYPASS GRAFT  1986   x5 LIMA to LAD,SVG to diagonal,SVG to OM,SVG to PDA and PLA  . CORONARY STENT PLACEMENT    . INGUINAL HERNIA REPAIR  04/25/2012   right- laparoscopic  . PACEMAKER IMPLANT N/A 03/09/2018   Procedure: PACEMAKER IMPLANT;  Surgeon: Evans Lance, MD;  Location: [place] [facility];  Service: Cardiovascular;  Laterality: N/A;  . R/P Myoview  12/27/2008   no ischemia  . TEE WITHOUT CARDIOVERSION N/A 03/09/2018   Procedure: TRANSESOPHAGEAL ECHOCARDIOGRAM (TEE);  Surgeon: Fay Records, MD;  Location: [facility] ENDOSCOPY;  Service: Cardiovascular;  Laterality: N/A;  Bubble  Study  . US ECHOCARDIOGRAPHY  12/27/2008   concentric LVH,mild to mod MR, TR and pulmonary hypertension. EF 45-50%    There were no vitals filed for this visit.   Subjective Assessment - 01/09/20 1501    Subjective relays his balance is not too good when asked, denies falls since last session, relays mild pain in the back of his legs    Pertinent History spinal stenosis/radiculopathy, chronic peripheral neuropathy, CHF, CAD, complete heart block, pacemaker, basal cell CA, atheroscelerosis on Aorta    Patient Stated Goals To improve balance to stop falls.    Pain Onset More than a month ago             [facility] Adult PT Treatment/Exercise - 01/09/20 0001      Transfers   Transfers Sit to Stand;Stand to Sit    Sit to Stand 5: Supervision;Without upper extremity assist;From chair/3-in-1    Sit to Stand Details (indicate cue type and reason) 2X5 reps      Therapeutic Activites    Therapeutic Activities Other Therapeutic Activities    Other Therapeutic Activities sit to stands 2X5, no UE support, heel toe raises 2X10, mini squats      Neuro Re-ed    Neuro Re-ed Details  Balance on foam for feet normal width apart, progressed to feet together, progressed to mod  tandem stance, progressed to step ups onto foam pad X 10 bilat. In bars for march walking, retro walking, sidestepping.      Exercises   Exercises Other Exercises    Other Exercises  nu step for LE ROM and endurance 5 min L4, slantboard stretch 30 sec X 3, seated H.S. stretch 30 sec X 2 bilat                    PT Short Term Goals - 12/31/19 1540      PT SHORT TERM GOAL #1   Title Patient verbalizes & demonstrates understanding on initial HEP.  (All STGs Target Date: 01/24/2020)    Time 1    Period Months    Status On-going    Target Date 01/24/20      PT SHORT TERM GOAL #2   Title Patient ambulates with head turns to scan environment and maintains path.    Time 1    Period Months    Status On-going    Target  Date 01/24/20      PT SHORT TERM GOAL #3   Title Patient able to stand reaching 10" anteriorly and scans right/left/up/down without loss of balance with supervision.    Time 1    Period Months    Status On-going    Target Date 01/24/20      PT SHORT TERM GOAL #4   Title Patient reports >20% improvement in pain with activiites.    Time 1    Period Months    Status On-going    Target Date 01/24/20      PT SHORT TERM GOAL #5   Status On-going             PT Long Term Goals - 12/31/19 1540      PT LONG TERM GOAL #1   Title Patient verbalizes & demonstrates understanding of ongoing HEP / fitness plan.  (All LTGs Target Date: 03/21/2020)    Time 12    Period Weeks    Status On-going    Target Date 03/21/20      PT LONG TERM GOAL #2   Title Berg Balance Test > 45/56    Time 12    Period Weeks    Status On-going    Target Date 03/21/20      PT LONG TERM GOAL #3   Title Functional Gait Assessment >/= 19/30 indicating lower fall risk.    Time 12    Period Weeks    Status On-going    Target Date 03/21/20      PT LONG TERM GOAL #4   Title Patient verbalizes & demonstrates understanding how to decrease leg pain to improve standing & gait tolerance.    Time 12    Period Weeks    Status On-going    Target Date 03/21/20      PT LONG TERM GOAL #5   Title Patient ambulates 300' & negotiates ramps/curbs with cane or less modified independent.    Time 12    Period Weeks    Status On-going    Target Date 03/21/20                 Plan - 01/09/20 1624    Clinical Impression Statement Session worked on overall static and dynamic balance, leg strength, leg stretching, and overall endurance as tolerated. He continues to have gait instabiliy and ankle plantarflexion weakness and PT will continue to progress this as able.  Personal Factors and Comorbidities Age;Comorbidity 3+;Fitness;Past/Current Experience    Comorbidities spinal stenosis/radiculopathy, chronic  peripheral neuropathy, CHF, CAD, complete heart block, pacemaker, basal cell CA, atheroscelerosis on Aorta,    Examination-Activity Limitations Locomotion Level;Stairs;Stand;Transfers    Examination-Participation Restrictions Community Activity    Stability/Clinical Decision Making Stable/Uncomplicated    Rehab Potential Good    PT Frequency 2x / week    PT Duration 12 weeks    PT Treatment/Interventions ADLs/Self Care Home Management;Cryotherapy;Electrical Stimulation;Moist Heat;Ultrasound;DME Instruction;Gait training;Stair training;Functional mobility training;Therapeutic activities;Therapeutic exercise;Balance training;Neuromuscular re-education;Patient/family education;Manual techniques;Passive range of motion;Dry needling;Vestibular    PT Next Visit Plan therapeutic exercises & activities for balance & strength.    PT Home Exercise Plan Access Code: [email or web address]    Consulted and Agree with Plan of Care Patient           Patient will benefit from skilled therapeutic intervention in order to improve the following deficits and impairments:  Abnormal gait, Decreased activity tolerance, Cardiopulmonary status limiting activity, Decreased balance, Decreased coordination, Decreased endurance, Decreased knowledge of use of DME, Decreased mobility, Decreased range of motion, Decreased strength, Impaired flexibility, Postural dysfunction, Pain  Visit Diagnosis: Other abnormalities of gait and mobility  Unsteadiness on feet  Abnormal posture  Weakness generalized  History of fall  Pain in left leg  Pain in right leg     Problem List Patient Active Problem List   Diagnosis Date Noted  . At risk for falls 12/04/2019  . Unsteady gait 12/04/2019  . Acquired thrombophilia ([place]) 07/23/2019  . Chronic combined systolic and diastolic heart failure ([place]) 08/29/2018  . Pacemaker 08/29/2018  . History of transient ischemic attack (TIA) 08/29/2018  . Lumbar pain 06/09/2018  . Senile purpura  ([place]) 05/11/2018  . Complete heart block ([place])   . Paroxysmal atrial flutter ([place]) 03/06/2018  . Atherosclerosis of aorta ([place]) 08/23/2016  . History of basal cell cancer 08/01/2015  . Major depression in full remission ([place]) 09/11/2014  . Essential hypertension 01/01/2014  . Vitamin D deficiency 01/01/2014  . Medication management 01/01/2014  . Other abnormal glucose 01/01/2014  . Nonsustained ventricular tachycardia ([place]) 05/23/2013  . CAD (coronary artery disease) 01/08/2013  . Cardiomyopathy, ischemic 01/08/2013  . Hyperlipidemia 01/08/2013  . LBBB (left bundle branch block) 01/08/2013    Silvestre Mesi 01/09/2020, 4:25 PM  [facility] Physical Therapy [address] [place], [place], [address] Phone: [phone]   Fax:  [phone]  Name: Savyon Loken. MRN: [id number] Date of Birth: 06-01-1933

## 2020-01-14 ENCOUNTER — Other Ambulatory Visit: Payer: Self-pay

## 2020-01-14 ENCOUNTER — Other Ambulatory Visit: Payer: Self-pay | Admitting: Adult Health

## 2020-01-14 ENCOUNTER — Encounter: Payer: Self-pay | Admitting: Physical Therapy

## 2020-01-14 ENCOUNTER — Ambulatory Visit (INDEPENDENT_AMBULATORY_CARE_PROVIDER_SITE_OTHER): Payer: Medicare Other | Admitting: Physical Therapy

## 2020-01-14 ENCOUNTER — Encounter: Payer: Medicare Other | Admitting: Rehabilitative and Restorative Service Providers"

## 2020-01-14 DIAGNOSIS — R2681 Unsteadiness on feet: Secondary | ICD-10-CM

## 2020-01-14 DIAGNOSIS — M5441 Lumbago with sciatica, right side: Secondary | ICD-10-CM

## 2020-01-14 DIAGNOSIS — R531 Weakness: Secondary | ICD-10-CM | POA: Diagnosis not present

## 2020-01-14 DIAGNOSIS — R2689 Other abnormalities of gait and mobility: Secondary | ICD-10-CM

## 2020-01-14 DIAGNOSIS — M79605 Pain in left leg: Secondary | ICD-10-CM

## 2020-01-14 DIAGNOSIS — R293 Abnormal posture: Secondary | ICD-10-CM | POA: Diagnosis not present

## 2020-01-14 DIAGNOSIS — G8929 Other chronic pain: Secondary | ICD-10-CM

## 2020-01-14 NOTE — Therapy (Signed)
[facility] Therapy [address] [place], [place], [address] Phone: [phone]   Fax:  [phone]  Physical Therapy Treatment  Patient Details  Name: Paul Frazier. MRN: [id number] Date of Birth: 1933/03/21 Referring Provider (PT): Liane Comber, NP   Encounter Date: 01/14/2020   PT End of Session - 01/14/20 1351    Visit Number 6    Number of Visits 25    Date for PT Re-Evaluation 03/20/20    Authorization Type Medicare A & B and AARP    PT Start Time 5009    PT Stop Time 1435    PT Time Calculation (min) 47 min    Equipment Utilized During Treatment Gait belt    Activity Tolerance No increased pain;Patient limited by fatigue    Behavior During Therapy [facility] for tasks assessed/performed           Past Medical History:  Diagnosis Date  . Arthritis   . CAD (coronary artery disease)   . Hyperlipidemia   . Hypertension   . Old myocardial infarct   . Right inguinal hernia 04/19/2012  . S/P CABG x 5 08/29/1984   LIMA to LAD,SVG to diagonal,SVG to OM,SVG to PDA and PLA  . Tremor, essential     Past Surgical History:  Procedure Laterality Date  . A-FLUTTER ABLATION N/A 03/09/2018   Procedure: A-FLUTTER ABLATION;  Surgeon: Evans Lance, MD;  Location: [place] [facility];  Service: Cardiovascular;  Laterality: N/A;  . CHOLECYSTECTOMY  10/1990  . CORONARY ARTERY BYPASS GRAFT  1986   x5 LIMA to LAD,SVG to diagonal,SVG to OM,SVG to PDA and PLA  . CORONARY STENT PLACEMENT    . INGUINAL HERNIA REPAIR  04/25/2012   right- laparoscopic  . PACEMAKER IMPLANT N/A 03/09/2018   Procedure: PACEMAKER IMPLANT;  Surgeon: Evans Lance, MD;  Location: [place] [facility];  Service: Cardiovascular;  Laterality: N/A;  . R/P Myoview  12/27/2008   no ischemia  . TEE WITHOUT CARDIOVERSION N/A 03/09/2018   Procedure: TRANSESOPHAGEAL ECHOCARDIOGRAM (TEE);  Surgeon: Fay Records, MD;  Location: [facility] ENDOSCOPY;  Service: Cardiovascular;  Laterality: N/A;  Bubble  Study  . US ECHOCARDIOGRAPHY  12/27/2008   concentric LVH,mild to mod MR, TR and pulmonary hypertension. EF 45-50%    There were no vitals filed for this visit.   Subjective Assessment - 01/14/20 1351    Subjective He has been doing his exercises some but has not gotten to them all because his legs are sore.    Pertinent History spinal stenosis/radiculopathy, chronic peripheral neuropathy, CHF, CAD, complete heart block, pacemaker, basal cell CA, atheroscelerosis on Aorta    Patient Stated Goals To improve balance to stop falls.    Pain Score 3     Pain Location Other (Comment)   thighs   Pain Orientation Right;Left;Posterior    Pain Descriptors / Indicators Aching;Burning    Pain Type Chronic pain    Pain Onset More than a month ago    Pain Frequency Constant    Aggravating Factors  standing & walking    Pain Relieving Factors sitting, resting & medications                             [facility] Adult PT Treatment/Exercise - 01/14/20 1350      Transfers   Transfers Sit to Stand;Stand to Sit    Sit to Stand 5: Supervision;Without upper extremity assist;From chair/3-in-1  Ambulation/Gait   Ambulation/Gait No    Ambulation/Gait Assistance 4: Min guard;5: Supervision    Ambulation/Gait Assistance Details worked on heel raise in terminal stance with demo, tactile & verbal cues alternating focus for each LE.      Ambulation Distance (Feet) 200 Feet    Assistive device None    Ambulation Surface Level;Indoor;Outdoor;Paved    Pre-Gait Activities resistive gait BLEs at pelvis to facilitate heel rise with push-off in terminal stance.       High Level Balance   High Level Balance Activities Side stepping;Backward walking;Other (comment)   toe walking   High Level Balance Comments //bar support      Therapeutic Activites    Therapeutic Activities Other Therapeutic Activities    Other Therapeutic Activities sit to stands 10 reps 24" stool no UE support,      Neuro  Re-ed    Neuro Re-ed Details  Balance on foam for feet normal width apart head turns      Exercises   Exercises --    Other Exercises  --               Balance Exercises - 01/14/20 1350      Balance Exercises: Standing   Standing Eyes Opened Narrow base of support (BOS);Head turns;Solid surface;5 reps;Limitations   head turns right/left, up/down & diagonals   Standing Eyes Opened Limitations in parallel bars with intermittent touch mirror for visual feedback    Standing Eyes Closed --    Standing Eyes Closed Limitations --    Rockerboard Anterior/posterior;Lateral;10 reps;UE support   light UE support on //bars   Other Standing Exercises --    Other Standing Exercises Comments --               PT Short Term Goals - 12/31/19 1540      PT SHORT TERM GOAL #1   Title Patient verbalizes & demonstrates understanding on initial HEP.  (All STGs Target Date: 01/24/2020)    Time 1    Period Months    Status On-going    Target Date 01/24/20      PT SHORT TERM GOAL #2   Title Patient ambulates with head turns to scan environment and maintains path.    Time 1    Period Months    Status On-going    Target Date 01/24/20      PT SHORT TERM GOAL #3   Title Patient able to stand reaching 10" anteriorly and scans right/left/up/down without loss of balance with supervision.    Time 1    Period Months    Status On-going    Target Date 01/24/20      PT SHORT TERM GOAL #4   Title Patient reports >20% improvement in pain with activiites.    Time 1    Period Months    Status On-going    Target Date 01/24/20      PT SHORT TERM GOAL #5   Status On-going             PT Long Term Goals - 12/31/19 1540      PT LONG TERM GOAL #1   Title Patient verbalizes & demonstrates understanding of ongoing HEP / fitness plan.  (All LTGs Target Date: 03/21/2020)    Time 12    Period Weeks    Status On-going    Target Date 03/21/20      PT LONG TERM GOAL #2   Title Berg Balance Test >  45/56  Time 12    Period Weeks    Status On-going    Target Date 03/21/20      PT LONG TERM GOAL #3   Title Functional Gait Assessment >/= 19/30 indicating lower fall risk.    Time 12    Period Weeks    Status On-going    Target Date 03/21/20      PT LONG TERM GOAL #4   Title Patient verbalizes & demonstrates understanding how to decrease leg pain to improve standing & gait tolerance.    Time 12    Period Weeks    Status On-going    Target Date 03/21/20      PT LONG TERM GOAL #5   Title Patient ambulates 300' & negotiates ramps/curbs with cane or less modified independent.    Time 12    Period Weeks    Status On-going    Target Date 03/21/20                 Plan - 01/14/20 1351    Clinical Impression Statement PT session focused on balance activties that facilitate ankle, hip & step strategies. PT also worked on gait focusing on improving push-off during terminal stance.    Personal Factors and Comorbidities Age;Comorbidity 3+;Fitness;Past/Current Experience    Comorbidities spinal stenosis/radiculopathy, chronic peripheral neuropathy, CHF, CAD, complete heart block, pacemaker, basal cell CA, atheroscelerosis on Aorta,    Examination-Activity Limitations Locomotion Level;Stairs;Stand;Transfers    Examination-Participation Restrictions Community Activity    Stability/Clinical Decision Making Stable/Uncomplicated    Rehab Potential Good    PT Frequency 2x / week    PT Duration 12 weeks    PT Treatment/Interventions ADLs/Self Care Home Management;Cryotherapy;Electrical Stimulation;Moist Heat;Ultrasound;DME Instruction;Gait training;Stair training;Functional mobility training;Therapeutic activities;Therapeutic exercise;Balance training;Neuromuscular re-education;Patient/family education;Manual techniques;Passive range of motion;Dry needling;Vestibular    PT Next Visit Plan therapeutic exercises & activities for balance & strength. gait training to improve push-off.    PT  Home Exercise Plan Access Code: [email or web address]    Consulted and Agree with Plan of Care Patient           Patient will benefit from skilled therapeutic intervention in order to improve the following deficits and impairments:  Abnormal gait, Decreased activity tolerance, Cardiopulmonary status limiting activity, Decreased balance, Decreased coordination, Decreased endurance, Decreased knowledge of use of DME, Decreased mobility, Decreased range of motion, Decreased strength, Impaired flexibility, Postural dysfunction, Pain  Visit Diagnosis: Other abnormalities of gait and mobility  Unsteadiness on feet  Abnormal posture  Weakness generalized  Pain in left leg     Problem List Patient Active Problem List   Diagnosis Date Noted  . At risk for falls 12/04/2019  . Unsteady gait 12/04/2019  . Acquired thrombophilia ([place]) 07/23/2019  . Chronic combined systolic and diastolic heart failure ([place]) 08/29/2018  . Pacemaker 08/29/2018  . History of transient ischemic attack (TIA) 08/29/2018  . Lumbar pain 06/09/2018  . Senile purpura ([place]) 05/11/2018  . Complete heart block ([place])   . Paroxysmal atrial flutter ([place]) 03/06/2018  . Atherosclerosis of aorta ([place]) 08/23/2016  . History of basal cell cancer 08/01/2015  . Major depression in full remission ([place]) 09/11/2014  . Essential hypertension 01/01/2014  . Vitamin D deficiency 01/01/2014  . Medication management 01/01/2014  . Other abnormal glucose 01/01/2014  . Nonsustained ventricular tachycardia ([place]) 05/23/2013  . CAD (coronary artery disease) 01/08/2013  . Cardiomyopathy, ischemic 01/08/2013  . Hyperlipidemia 01/08/2013  . LBBB (left bundle branch block) 01/08/2013    Jamey Reas PT, DPT  01/14/2020, 10:43 PM  [facility] Physical Therapy [address] [place], [place], [address] Phone: [phone]   Fax:  [phone]  Name: Paul Frazier. MRN: [id number] Date of Birth: 05/22/1933

## 2020-01-16 ENCOUNTER — Ambulatory Visit (INDEPENDENT_AMBULATORY_CARE_PROVIDER_SITE_OTHER): Payer: Medicare Other | Admitting: Physical Therapy

## 2020-01-16 ENCOUNTER — Other Ambulatory Visit: Payer: Self-pay

## 2020-01-16 DIAGNOSIS — R531 Weakness: Secondary | ICD-10-CM

## 2020-01-16 DIAGNOSIS — R293 Abnormal posture: Secondary | ICD-10-CM

## 2020-01-16 DIAGNOSIS — R2681 Unsteadiness on feet: Secondary | ICD-10-CM | POA: Diagnosis not present

## 2020-01-16 DIAGNOSIS — M79605 Pain in left leg: Secondary | ICD-10-CM

## 2020-01-16 DIAGNOSIS — Z9181 History of falling: Secondary | ICD-10-CM

## 2020-01-16 DIAGNOSIS — R2689 Other abnormalities of gait and mobility: Secondary | ICD-10-CM

## 2020-01-16 DIAGNOSIS — M79604 Pain in right leg: Secondary | ICD-10-CM

## 2020-01-16 NOTE — Therapy (Signed)
[facility] Physical Therapy [address] [place], [place], [address] Phone: [phone]   Fax:  [phone]  Physical Therapy Treatment  Patient Details  Name: Paul Frazier. MRN: [id number] Date of Birth: 04/25/1933 Referring Provider (PT): Liane Comber, NP   Encounter Date: 01/16/2020   PT End of Session - 01/16/20 1456    Visit Number 7    Number of Visits 25    Date for PT Re-Evaluation 03/20/20    Authorization Type Medicare A & B and AARP    PT Start Time 1430    PT Stop Time 1510    PT Time Calculation (min) 40 min    Activity Tolerance No increased pain;Patient limited by fatigue    Behavior During Therapy [facility] for tasks assessed/performed           Past Medical History:  Diagnosis Date  . Arthritis   . CAD (coronary artery disease)   . Hyperlipidemia   . Hypertension   . Old myocardial infarct   . Right inguinal hernia 04/19/2012  . S/P CABG x 5 08/29/1984   LIMA to LAD,SVG to diagonal,SVG to OM,SVG to PDA and PLA  . Tremor, essential     Past Surgical History:  Procedure Laterality Date  . A-FLUTTER ABLATION N/A 03/09/2018   Procedure: A-FLUTTER ABLATION;  Surgeon: Evans Lance, MD;  Location: [place] [facility];  Service: Cardiovascular;  Laterality: N/A;  . CHOLECYSTECTOMY  10/1990  . CORONARY ARTERY BYPASS GRAFT  1986   x5 LIMA to LAD,SVG to diagonal,SVG to OM,SVG to PDA and PLA  . CORONARY STENT PLACEMENT    . INGUINAL HERNIA REPAIR  04/25/2012   right- laparoscopic  . PACEMAKER IMPLANT N/A 03/09/2018   Procedure: PACEMAKER IMPLANT;  Surgeon: Evans Lance, MD;  Location: [place] [facility];  Service: Cardiovascular;  Laterality: N/A;  . R/P Myoview  12/27/2008   no ischemia  . TEE WITHOUT CARDIOVERSION N/A 03/09/2018   Procedure: TRANSESOPHAGEAL ECHOCARDIOGRAM (TEE);  Surgeon: Fay Records, MD;  Location: [facility] ENDOSCOPY;  Service: Cardiovascular;  Laterality: N/A;  Bubble Study  . US ECHOCARDIOGRAPHY  12/27/2008    concentric LVH,mild to mod MR, TR and pulmonary hypertension. EF 45-50%    There were no vitals filed for this visit.       [facility] Adult PT Treatment/Exercise - 01/16/20 0001      Therapeutic Activites    Other Therapeutic Activities sit to stands at treadmill rail 2X10, heel toe raises X 20 reps      Neuro Re-ed    Neuro Re-ed Details  balance in bars: sidestepping and retro walking no UE support. Rocker board A-P and lateral.      Exercises   Other Exercises  nu step for LE ROM and endurance 6 min L5, slantboard stretch 30 sec X 2, seated H.S. stretch 30 sec X 2 bilat. Seated ankle PF green X 20 bilat             PT Short Term Goals - 12/31/19 1540      PT SHORT TERM GOAL #1   Title Patient verbalizes & demonstrates understanding on initial HEP.  (All STGs Target Date: 01/24/2020)    Time 1    Period Months    Status On-going    Target Date 01/24/20      PT SHORT TERM GOAL #2   Title Patient ambulates with head turns to scan environment and maintains path.    Time 1  Period Months    Status On-going    Target Date 01/24/20      PT SHORT TERM GOAL #3   Title Patient able to stand reaching 10" anteriorly and scans right/left/up/down without loss of balance with supervision.    Time 1    Period Months    Status On-going    Target Date 01/24/20      PT SHORT TERM GOAL #4   Title Patient reports >20% improvement in pain with activiites.    Time 1    Period Months    Status On-going    Target Date 01/24/20      PT SHORT TERM GOAL #5   Status On-going             PT Long Term Goals - 12/31/19 1540      PT LONG TERM GOAL #1   Title Patient verbalizes & demonstrates understanding of ongoing HEP / fitness plan.  (All LTGs Target Date: 03/21/2020)    Time 12    Period Weeks    Status On-going    Target Date 03/21/20      PT LONG TERM GOAL #2   Title Berg Balance Test > 45/56    Time 12    Period Weeks    Status On-going    Target Date 03/21/20      PT  LONG TERM GOAL #3   Title Functional Gait Assessment >/= 19/30 indicating lower fall risk.    Time 12    Period Weeks    Status On-going    Target Date 03/21/20      PT LONG TERM GOAL #4   Title Patient verbalizes & demonstrates understanding how to decrease leg pain to improve standing & gait tolerance.    Time 12    Period Weeks    Status On-going    Target Date 03/21/20      PT LONG TERM GOAL #5   Title Patient ambulates 300' & negotiates ramps/curbs with cane or less modified independent.    Time 12    Period Weeks    Status On-going    Target Date 03/21/20                 Plan - 01/16/20 1509    Clinical Impression Statement Continued with balance and leg strengthening today with good tolerance and without complaints. Worked on more anterior weight shifting as he still tends to ambulate or stand with weight on his heels which can cause him to lose his balance posteriorly at times.    Personal Factors and Comorbidities Age;Comorbidity 3+;Fitness;Past/Current Experience    Comorbidities spinal stenosis/radiculopathy, chronic peripheral neuropathy, CHF, CAD, complete heart block, pacemaker, basal cell CA, atheroscelerosis on Aorta,    Examination-Activity Limitations Locomotion Level;Stairs;Stand;Transfers    Examination-Participation Restrictions Community Activity    Stability/Clinical Decision Making Stable/Uncomplicated    Rehab Potential Good    PT Frequency 2x / week    PT Duration 12 weeks    PT Treatment/Interventions ADLs/Self Care Home Management;Cryotherapy;Electrical Stimulation;Moist Heat;Ultrasound;DME Instruction;Gait training;Stair training;Functional mobility training;Therapeutic activities;Therapeutic exercise;Balance training;Neuromuscular re-education;Patient/family education;Manual techniques;Passive range of motion;Dry needling;Vestibular    PT Next Visit Plan therapeutic exercises & activities for balance & strength. gait training to improve push-off.     PT Home Exercise Plan Access Code: [email or web address]    Consulted and Agree with Plan of Care Patient           Patient will benefit from skilled therapeutic intervention in order to improve the  following deficits and impairments:  Abnormal gait, Decreased activity tolerance, Cardiopulmonary status limiting activity, Decreased balance, Decreased coordination, Decreased endurance, Decreased knowledge of use of DME, Decreased mobility, Decreased range of motion, Decreased strength, Impaired flexibility, Postural dysfunction, Pain  Visit Diagnosis: Other abnormalities of gait and mobility  Unsteadiness on feet  Abnormal posture  Weakness generalized  Pain in left leg  History of fall  Pain in right leg     Problem List Patient Active Problem List   Diagnosis Date Noted  . At risk for falls 12/04/2019  . Unsteady gait 12/04/2019  . Acquired thrombophilia ([place]) 07/23/2019  . Chronic combined systolic and diastolic heart failure ([place]) 08/29/2018  . Pacemaker 08/29/2018  . History of transient ischemic attack (TIA) 08/29/2018  . Lumbar pain 06/09/2018  . Senile purpura ([place]) 05/11/2018  . Complete heart block ([place])   . Paroxysmal atrial flutter ([place]) 03/06/2018  . Atherosclerosis of aorta ([place]) 08/23/2016  . History of basal cell cancer 08/01/2015  . Major depression in full remission ([place]) 09/11/2014  . Essential hypertension 01/01/2014  . Vitamin D deficiency 01/01/2014  . Medication management 01/01/2014  . Other abnormal glucose 01/01/2014  . Nonsustained ventricular tachycardia ([place]) 05/23/2013  . CAD (coronary artery disease) 01/08/2013  . Cardiomyopathy, ischemic 01/08/2013  . Hyperlipidemia 01/08/2013  . LBBB (left bundle branch block) 01/08/2013    Silvestre Mesi 01/16/2020, 3:14 PM  [facility] Physical Therapy [address] [place], [place], [address] Phone: [phone]   Fax:  [phone]  Name: Paul Frazier. MRN:  [id number] Date of Birth: 1933/01/01

## 2020-01-22 ENCOUNTER — Encounter: Payer: Self-pay | Admitting: Physical Therapy

## 2020-01-22 ENCOUNTER — Emergency Department (HOSPITAL_COMMUNITY): Payer: Medicare Other

## 2020-01-22 ENCOUNTER — Other Ambulatory Visit: Payer: Self-pay

## 2020-01-22 ENCOUNTER — Ambulatory Visit (INDEPENDENT_AMBULATORY_CARE_PROVIDER_SITE_OTHER): Payer: Medicare Other | Admitting: Physical Therapy

## 2020-01-22 ENCOUNTER — Ambulatory Visit: Payer: Medicare Other | Admitting: Orthopaedic Surgery

## 2020-01-22 ENCOUNTER — Emergency Department (HOSPITAL_COMMUNITY)
Admission: EM | Admit: 2020-01-22 | Discharge: 2020-01-22 | Disposition: A | Discharge status: Home / Self Care | Payer: Medicare Other | Attending: Emergency Medicine | Admitting: Emergency Medicine

## 2020-01-22 ENCOUNTER — Encounter (HOSPITAL_COMMUNITY): Payer: Self-pay

## 2020-01-22 DIAGNOSIS — I5042 Chronic combined systolic (congestive) and diastolic (congestive) heart failure: Secondary | ICD-10-CM | POA: Insufficient documentation

## 2020-01-22 DIAGNOSIS — Z87891 Personal history of nicotine dependence: Secondary | ICD-10-CM | POA: Diagnosis not present

## 2020-01-22 DIAGNOSIS — I110 Hypertensive heart disease with heart failure: Secondary | ICD-10-CM | POA: Insufficient documentation

## 2020-01-22 DIAGNOSIS — Z45018 Encounter for adjustment and management of other part of cardiac pacemaker: Secondary | ICD-10-CM | POA: Insufficient documentation

## 2020-01-22 DIAGNOSIS — Z043 Encounter for examination and observation following other accident: Secondary | ICD-10-CM | POA: Diagnosis not present

## 2020-01-22 DIAGNOSIS — W19XXXA Unspecified fall, initial encounter: Secondary | ICD-10-CM | POA: Diagnosis not present

## 2020-01-22 DIAGNOSIS — R296 Repeated falls: Secondary | ICD-10-CM

## 2020-01-22 DIAGNOSIS — R2681 Unsteadiness on feet: Secondary | ICD-10-CM | POA: Diagnosis not present

## 2020-01-22 DIAGNOSIS — G319 Degenerative disease of nervous system, unspecified: Secondary | ICD-10-CM | POA: Diagnosis not present

## 2020-01-22 DIAGNOSIS — S60512A Abrasion of left hand, initial encounter: Secondary | ICD-10-CM | POA: Diagnosis not present

## 2020-01-22 DIAGNOSIS — M79605 Pain in left leg: Secondary | ICD-10-CM

## 2020-01-22 DIAGNOSIS — S60511A Abrasion of right hand, initial encounter: Secondary | ICD-10-CM | POA: Diagnosis not present

## 2020-01-22 DIAGNOSIS — R293 Abnormal posture: Secondary | ICD-10-CM

## 2020-01-22 DIAGNOSIS — T148XXA Other injury of unspecified body region, initial encounter: Secondary | ICD-10-CM

## 2020-01-22 DIAGNOSIS — R29818 Other symptoms and signs involving the nervous system: Secondary | ICD-10-CM | POA: Diagnosis not present

## 2020-01-22 DIAGNOSIS — R2689 Other abnormalities of gait and mobility: Secondary | ICD-10-CM | POA: Diagnosis not present

## 2020-01-22 DIAGNOSIS — M79604 Pain in right leg: Secondary | ICD-10-CM

## 2020-01-22 DIAGNOSIS — Z79899 Other long term (current) drug therapy: Secondary | ICD-10-CM | POA: Insufficient documentation

## 2020-01-22 DIAGNOSIS — R531 Weakness: Secondary | ICD-10-CM | POA: Diagnosis not present

## 2020-01-22 DIAGNOSIS — I10 Essential (primary) hypertension: Secondary | ICD-10-CM | POA: Diagnosis not present

## 2020-01-22 LAB — CBC WITH DIFFERENTIAL/PLATELET
Abs Immature Granulocytes: 0.03 10*3/uL (ref 0.00–0.07)
Basophils Absolute: 0 10*3/uL (ref 0.0–0.1)
Basophils Relative: 0 %
Eosinophils Absolute: 0.2 10*3/uL (ref 0.0–0.5)
Eosinophils Relative: 2 %
HCT: 44.9 % (ref 39.0–52.0)
Hemoglobin: 15.5 g/dL (ref 13.0–17.0)
Immature Granulocytes: 0 %
Lymphocytes Relative: 26 %
Lymphs Abs: 2.7 10*3/uL (ref 0.7–4.0)
MCH: 33 pg (ref 26.0–34.0)
MCHC: 34.5 g/dL (ref 30.0–36.0)
MCV: 95.7 fL (ref 80.0–100.0)
Monocytes Absolute: 1.1 10*3/uL — ABNORMAL HIGH (ref 0.1–1.0)
Monocytes Relative: 11 %
Neutro Abs: 6.2 10*3/uL (ref 1.7–7.7)
Neutrophils Relative %: 61 %
Platelets: 210 10*3/uL (ref 150–400)
RBC: 4.69 MIL/uL (ref 4.22–5.81)
RDW: 14.4 % (ref 11.5–15.5)
WBC: 10.2 10*3/uL (ref 4.0–10.5)
nRBC: 0 % (ref 0.0–0.2)

## 2020-01-22 LAB — COMPREHENSIVE METABOLIC PANEL
ALT: 26 U/L (ref 0–44)
AST: 30 U/L (ref 15–41)
Albumin: 4.4 g/dL (ref 3.5–5.0)
Alkaline Phosphatase: 46 U/L (ref 38–126)
Anion gap: 14 (ref 5–15)
BUN: 18 mg/dL (ref 8–23)
CO2: 24 mmol/L (ref 22–32)
Calcium: 9.6 mg/dL (ref 8.9–10.3)
Chloride: 100 mmol/L (ref 98–111)
Creatinine, Ser: 1.24 mg/dL (ref 0.61–1.24)
GFR calc Af Amer: >60 mL/min (ref >60)
GFR calc non Af Amer: 53 mL/min — ABNORMAL LOW (ref >60)
Glucose, Bld: 122 mg/dL — ABNORMAL HIGH (ref 70–99)
Potassium: 3.2 mmol/L — ABNORMAL LOW (ref 3.5–5.1)
Sodium: 138 mmol/L (ref 135–145)
Total Bilirubin: 0.9 mg/dL (ref 0.3–1.2)
Total Protein: 7.5 g/dL (ref 6.5–8.1)

## 2020-01-22 MED ORDER — HYDROCODONE-ACETAMINOPHEN 5-325 MG PO TABS: 1.0000 | ORAL_TABLET | ORAL | 0 refills | Status: DC | PRN

## 2020-01-22 MED ORDER — SODIUM CHLORIDE 0.9 % IV SOLN: INTRAVENOUS | Status: DC

## 2020-01-22 MED ORDER — POTASSIUM CHLORIDE CRYS ER 20 MEQ PO TBCR
40.0000 meq | EXTENDED_RELEASE_TABLET | Freq: Once | ORAL | Status: AC
  Administered 2020-01-22: 40 meq via ORAL
  Filled 2020-01-22: qty 2

## 2020-01-22 MED ORDER — OXYCODONE-ACETAMINOPHEN 5-325 MG PO TABS
2.0000 | ORAL_TABLET | Freq: Once | ORAL | Status: AC
  Administered 2020-01-22: 2 via ORAL
  Filled 2020-01-22: qty 2

## 2020-01-22 NOTE — ED Provider Notes (Signed)
[facility] DEPT Provider Note   CSN: [id number] Arrival date & time: 01/22/20  1645     History Chief Complaint  Patient presents with  . Fall    Paul Frazier. is a 86 y.o. male.  86 year old male who presents here due to increasing falls.  States that he has had trouble with his gait for some time and that he should be using a cane but does not.  States that twice a day his legs gave out from under him and he fell down.  Did not strike his head or have LOC.  Patient does take Eliquis.  Denies any back pain.  No bowel or bladder dysfunction.  No weakness to his legs.  Denies any medications changes.  No recent illnesses.  Has abrasions to his bilateral knees as well as wrist.  Denies any chest or abdominal discomfort.  No symptoms prior to when he fell        Past Medical History:  Diagnosis Date  . Arthritis   . CAD (coronary artery disease)   . Hyperlipidemia   . Hypertension   . Old myocardial infarct   . Right inguinal hernia 04/19/2012  . S/P CABG x 5 08/29/1984   LIMA to LAD,SVG to diagonal,SVG to OM,SVG to PDA and PLA  . Tremor, essential     Patient Active Problem List   Diagnosis Date Noted  . At risk for falls 12/04/2019  . Unsteady gait 12/04/2019  . Acquired thrombophilia ([place]) 07/23/2019  . Chronic combined systolic and diastolic heart failure ([place]) 08/29/2018  . Pacemaker 08/29/2018  . History of transient ischemic attack (TIA) 08/29/2018  . Lumbar pain 06/09/2018  . Senile purpura ([place]) 05/11/2018  . Complete heart block ([place])   . Paroxysmal atrial flutter ([place]) 03/06/2018  . Atherosclerosis of aorta ([place]) 08/23/2016  . History of basal cell cancer 08/01/2015  . Major depression in full remission ([place]) 09/11/2014  . Essential hypertension 01/01/2014  . Vitamin D deficiency 01/01/2014  . Medication management 01/01/2014  . Other abnormal glucose 01/01/2014  . Nonsustained ventricular tachycardia ([place]) 05/23/2013    . CAD (coronary artery disease) 01/08/2013  . Cardiomyopathy, ischemic 01/08/2013  . Hyperlipidemia 01/08/2013  . LBBB (left bundle branch block) 01/08/2013    Past Surgical History:  Procedure Laterality Date  . A-FLUTTER ABLATION N/A 03/09/2018   Procedure: A-FLUTTER ABLATION;  Surgeon: Evans Lance, MD;  Location: [place] [facility];  Service: Cardiovascular;  Laterality: N/A;  . CHOLECYSTECTOMY  10/1990  . CORONARY ARTERY BYPASS GRAFT  1986   x5 LIMA to LAD,SVG to diagonal,SVG to OM,SVG to PDA and PLA  . CORONARY STENT PLACEMENT    . INGUINAL HERNIA REPAIR  04/25/2012   right- laparoscopic  . PACEMAKER IMPLANT N/A 03/09/2018   Procedure: PACEMAKER IMPLANT;  Surgeon: Evans Lance, MD;  Location: [place] [facility];  Service: Cardiovascular;  Laterality: N/A;  . R/P Myoview  12/27/2008   no ischemia  . TEE WITHOUT CARDIOVERSION N/A 03/09/2018   Procedure: TRANSESOPHAGEAL ECHOCARDIOGRAM (TEE);  Surgeon: Fay Records, MD;  Location: [facility] ENDOSCOPY;  Service: Cardiovascular;  Laterality: N/A;  Bubble Study  . US ECHOCARDIOGRAPHY  12/27/2008   concentric LVH,mild to mod MR, TR and pulmonary hypertension. EF 45-50%       Family History  Problem Relation Age of Onset  . Cancer Father        bone  . Cancer Brother        lymphnode  Social History   Tobacco Use  . Smoking status: Former Smoker    Quit date: 07/19/1970    Years since quitting: 49.5  . Smokeless tobacco: Never Used  Vaping Use  . Vaping Use: Never used  Substance Use Topics  . Alcohol use: No    Alcohol/week: 0.0 standard drinks  . Drug use: No    Home Medications Prior to Admission medications   Medication Sig Start Date End Date Taking? Authorizing Provider  acetaminophen (TYLENOL) 325 MG tablet Take 650 mg by mouth every 6 (six) hours as needed.    [provider]  apixaban (ELIQUIS) 5 MG TABS tablet Take 1 tablet (5 mg total) by mouth 2 (two) times daily. 10/04/19   Croitoru, Mihai, MD   atorvastatin (LIPITOR) 40 MG tablet Take 1 tablet daily for Cholesterol 10/01/19   Liane Comber, NP  buPROPion (WELLBUTRIN XL) 300 MG 24 hr tablet Take 1 tablet daily for Mood 12/29/19   Unk Pinto, MD  citalopram (CELEXA) 40 MG tablet Take 1 tablet (40 mg total) by mouth daily. 10/08/19 10/07/20  Vicie Mutters, PA-C  co-enzyme Q-10 30 MG capsule Take 30 mg by mouth daily.     [provider]  diazepam (VALIUM) 5 MG tablet Take 1/2 to 1 tablet 2 to 3 x / day only if needed for Muscle spasms  & please try to limit to 5 days /week to avoid addiction 10/08/19   Vicie Mutters, PA-C  Digestive Enzymes CAPS Take 1-2 capsules by mouth as needed (to aid digestive function).     [provider]  gabapentin (NEURONTIN) 300 MG capsule Take 1 capsule 3 x /day as needed for Chronic Pain 01/15/20   Unk Pinto, MD  hydrochlorothiazide (HYDRODIURIL) 25 MG tablet TAKE 1 TABLET DAILY ONLY  IF  NEEDED  FOR  ANKLE  SWELLING 10/01/19   Liane Comber, NP  losartan (COZAAR) 25 MG tablet Take 0.5 tablets (12.5 mg total) by mouth daily. 10/05/19 01/03/20  Croitoru, Mihai, MD  Magnesium Chloride (MAGNESIUM DR PO) Take 2 tablets by mouth daily.     [provider]  metoprolol succinate (TOPROL-XL) 25 MG 24 hr tablet Take 1 tablet Daily for BP & Heart 10/01/19   Vicie Mutters, PA-C  nitroGLYCERIN (NITROSTAT) 0.4 MG SL tablet Place 1 tablet (0.4 mg total) under the tongue every 5 (five) minutes as needed for chest pain. 12/25/19   Unk Pinto, MD    Allergies    Erythromycin  Review of Systems   Review of Systems  All other systems reviewed and are negative.   Physical Exam Updated Vital Signs BP 121/90 (BP Location: Left Arm)   Pulse 68   Temp 98.4 F (36.9 C) (Oral)   Resp 18   SpO2 96%   Physical Exam Vitals and nursing note reviewed.  Constitutional:      General: He is not in acute distress.    Appearance: Normal appearance. He is well-developed. He is not  toxic-appearing.  HENT:     Head: Normocephalic and atraumatic.  Eyes:     General: Lids are normal.     Conjunctiva/sclera: Conjunctivae normal.     Pupils: Pupils are equal, round, and reactive to light.  Neck:     Thyroid: No thyroid mass.     Trachea: No tracheal deviation.  Cardiovascular:     Rate and Rhythm: Normal rate and regular rhythm.     Heart sounds: Normal heart sounds. No murmur heard.  No gallop.  Pulmonary:     Effort: Pulmonary effort is normal. No respiratory distress.     Breath sounds: Normal breath sounds. No stridor. No decreased breath sounds, wheezing, rhonchi or rales.  Abdominal:     General: Bowel sounds are normal. There is no distension.     Palpations: Abdomen is soft.     Tenderness: There is no abdominal tenderness. There is no rebound.  Musculoskeletal:        General: No tenderness. Normal range of motion.     Cervical back: Normal range of motion and neck supple.       Legs:     Comments: Bilateral hand abrasions  Skin:    General: Skin is warm and dry.     Findings: No abrasion or rash.  Neurological:     General: No focal deficit present.     Mental Status: He is alert and oriented to person, place, and time.     GCS: GCS eye subscore is 4. GCS verbal subscore is 5. GCS motor subscore is 6.     Cranial Nerves: No cranial nerve deficit or dysarthria.     Sensory: No sensory deficit.     Motor: No weakness.     Comments: Strength is 5 of 5 in upper as well as lower extremities  Psychiatric:        Speech: Speech normal.        Behavior: Behavior normal.     ED Results / Procedures / Treatments   Labs (all labs ordered are listed, but only abnormal results are displayed) Labs Reviewed  CBC WITH DIFFERENTIAL/PLATELET  COMPREHENSIVE METABOLIC PANEL    EKG None  Radiology No results found.  Procedures Procedures (including critical care time)  Medications Ordered in ED Medications  0.9 %  sodium chloride infusion (has no  administration in time range)    ED Course  I have reviewed the triage vital signs and the nursing notes.  Pertinent labs & imaging results that were available during my care of the patient were reviewed by me and considered in my medical decision making (see chart for details).    MDM Rules/Calculators/A&P                          Patient multiple bruises and abrasions that were dressed by nursing with bacitracin.  Head CT without acute findings.  Laboratory studies are reassuring with exception of mild hypokalemia 3.2.  Will give oral potassium for that.  Son was at the bedside and states that his father has had gait problems for a long time.  Has chronic peripheral neuropathy in his feet.  It is unchanged.  His father does not use his cane that he should.  Will discharge home with return precautions Final Clinical Impression(s) / ED Diagnoses Final diagnoses:  None    Rx / DC Orders ED Discharge Orders    None       Lacretia Leigh, MD 01/22/20 1916

## 2020-01-22 NOTE — Therapy (Signed)
[facility] [address] [place], [place], [address] Phone: [phone]   Fax:  [phone]  Physical Therapy Treatment  Patient Details  Name: Paul Frazier. MRN: [id number] Date of Birth: Dec 12, 1932 Referring Provider (PT): Liane Comber, NP   Encounter Date: 01/22/2020   PT End of Session - 01/22/20 1528    Visit Number 8    Number of Visits 25    Date for PT Re-Evaluation 03/20/20    Authorization Type Medicare A & B and AARP    PT Start Time 1430    PT Stop Time 1514    PT Time Calculation (min) 44 min    Activity Tolerance No increased pain;Patient limited by fatigue    Behavior During Therapy [facility] for tasks assessed/performed           Past Medical History:  Diagnosis Date  . Arthritis   . CAD (coronary artery disease)   . Hyperlipidemia   . Hypertension   . Old myocardial infarct   . Right inguinal hernia 04/19/2012  . S/P CABG x 5 08/29/1984   LIMA to LAD,SVG to diagonal,SVG to OM,SVG to PDA and PLA  . Tremor, essential     Past Surgical History:  Procedure Laterality Date  . A-FLUTTER ABLATION N/A 03/09/2018   Procedure: A-FLUTTER ABLATION;  Surgeon: Evans Lance, MD;  Location: [place] [facility] LAB;  Service: Cardiovascular;  Laterality: N/A;  . CHOLECYSTECTOMY  10/1990  . CORONARY ARTERY BYPASS GRAFT  1986   x5 LIMA to LAD,SVG to diagonal,SVG to OM,SVG to PDA and PLA  . CORONARY STENT PLACEMENT    . INGUINAL HERNIA REPAIR  04/25/2012   right- laparoscopic  . PACEMAKER IMPLANT N/A 03/09/2018   Procedure: PACEMAKER IMPLANT;  Surgeon: Evans Lance, MD;  Location: [place] [facility] LAB;  Service: Cardiovascular;  Laterality: N/A;  . R/P Myoview  12/27/2008   no ischemia  . TEE WITHOUT CARDIOVERSION N/A 03/09/2018   Procedure: TRANSESOPHAGEAL ECHOCARDIOGRAM (TEE);  Surgeon: Fay Records, MD;  Location: [facility] ENDOSCOPY;  Service: Cardiovascular;  Laterality: N/A;  Bubble Study  . US ECHOCARDIOGRAPHY  12/27/2008   concentric  LVH,mild to mod MR, TR and pulmonary hypertension. EF 45-50%    There were no vitals filed for this visit.   Subjective Assessment - 01/22/20 1455    Subjective relays that he had another fall one week ago last tuesday when he tried to get up from toilet he fell back and scraped his Lt arm against wall. He also expresses more N/T in his hands with worsening tremor. He was encouraged to contact his PCP to see if neurology consult would be appropriate.    Pertinent History spinal stenosis/radiculopathy, chronic peripheral neuropathy, CHF, CAD, complete heart block, pacemaker, basal cell CA, atheroscelerosis on Aorta    Patient Stated Goals To improve balance to stop falls.    Pain Onset More than a month ago               [facility] Adult PT Treatment/Exercise - 01/22/20 0001      High Level Balance   High Level Balance Comments tandem balance, balance on foam with feet together then feet apart with head turns lateral and vertical      Therapeutic Activites    Other Therapeutic Activities squats at treadmill rail 2X10 with chair behind him, heel toe raises X 20 reps      Exercises   Other Exercises  nu step for LE ROM and endurance 6  min L5, slantboard stretch 30 sec X 2, seated H.S. stretch 30 sec X 2 bilat. Seated ankle PF and DF green X 20 bilat                    PT Short Term Goals - 12/31/19 1540      PT SHORT TERM GOAL #1   Title Patient verbalizes & demonstrates understanding on initial HEP.  (All STGs Target Date: 01/24/2020)    Time 1    Period Months    Status On-going    Target Date 01/24/20      PT SHORT TERM GOAL #2   Title Patient ambulates with head turns to scan environment and maintains path.    Time 1    Period Months    Status On-going    Target Date 01/24/20      PT SHORT TERM GOAL #3   Title Patient able to stand reaching 10" anteriorly and scans right/left/up/down without loss of balance with supervision.    Time 1    Period Months    Status  On-going    Target Date 01/24/20      PT SHORT TERM GOAL #4   Title Patient reports >20% improvement in pain with activiites.    Time 1    Period Months    Status On-going    Target Date 01/24/20      PT SHORT TERM GOAL #5   Status On-going             PT Long Term Goals - 12/31/19 1540      PT LONG TERM GOAL #1   Title Patient verbalizes & demonstrates understanding of ongoing HEP / fitness plan.  (All LTGs Target Date: 03/21/2020)    Time 12    Period Weeks    Status On-going    Target Date 03/21/20      PT LONG TERM GOAL #2   Title Berg Balance Test > 45/56    Time 12    Period Weeks    Status On-going    Target Date 03/21/20      PT LONG TERM GOAL #3   Title Functional Gait Assessment >/= 19/30 indicating lower fall risk.    Time 12    Period Weeks    Status On-going    Target Date 03/21/20      PT LONG TERM GOAL #4   Title Patient verbalizes & demonstrates understanding how to decrease leg pain to improve standing & gait tolerance.    Time 12    Period Weeks    Status On-going    Target Date 03/21/20      PT LONG TERM GOAL #5   Title Patient ambulates 300' & negotiates ramps/curbs with cane or less modified independent.    Time 12    Period Weeks    Status On-going    Target Date 03/21/20                 Plan - 01/22/20 1529    Clinical Impression Statement He had another fall last week and continues to trend with losing his balance more posteriorly. Continue with ankle and leg strength along with balance training trying to incorporate more ankle strategy which he is lacking to self correct balance.    Personal Factors and Comorbidities Age;Comorbidity 3+;Fitness;Past/Current Experience    Comorbidities spinal stenosis/radiculopathy, chronic peripheral neuropathy, CHF, CAD, complete heart block, pacemaker, basal cell CA, atheroscelerosis on Aorta,    Examination-Activity  Limitations Locomotion Level;Stairs;Stand;Transfers     Examination-Participation Restrictions Community Activity    Stability/Clinical Decision Making Stable/Uncomplicated    Rehab Potential Good    PT Frequency 2x / week    PT Duration 12 weeks    PT Treatment/Interventions ADLs/Self Care Home Management;Cryotherapy;Electrical Stimulation;Moist Heat;Ultrasound;DME Instruction;Gait training;Stair training;Functional mobility training;Therapeutic activities;Therapeutic exercise;Balance training;Neuromuscular re-education;Patient/family education;Manual techniques;Passive range of motion;Dry needling;Vestibular    PT Next Visit Plan therapeutic exercises & activities for balance & strength. gait training to improve push-off.    PT Home Exercise Plan Access Code: [email or web address]    Consulted and Agree with Plan of Care Patient           Patient will benefit from skilled therapeutic intervention in order to improve the following deficits and impairments:  Abnormal gait, Decreased activity tolerance, Cardiopulmonary status limiting activity, Decreased balance, Decreased coordination, Decreased endurance, Decreased knowledge of use of DME, Decreased mobility, Decreased range of motion, Decreased strength, Impaired flexibility, Postural dysfunction, Pain  Visit Diagnosis: Other abnormalities of gait and mobility  Unsteadiness on feet  Abnormal posture  Weakness generalized  Pain in left leg  Pain in right leg  Repeated falls     Problem List Patient Active Problem List   Diagnosis Date Noted  . At risk for falls 12/04/2019  . Unsteady gait 12/04/2019  . Acquired thrombophilia ([place]) 07/23/2019  . Chronic combined systolic and diastolic heart failure ([place]) 08/29/2018  . Pacemaker 08/29/2018  . History of transient ischemic attack (TIA) 08/29/2018  . Lumbar pain 06/09/2018  . Senile purpura ([place]) 05/11/2018  . Complete heart block ([place])   . Paroxysmal atrial flutter ([place]) 03/06/2018  . Atherosclerosis of aorta ([place]) 08/23/2016  . History  of basal cell cancer 08/01/2015  . Major depression in full remission ([place]) 09/11/2014  . Essential hypertension 01/01/2014  . Vitamin D deficiency 01/01/2014  . Medication management 01/01/2014  . Other abnormal glucose 01/01/2014  . Nonsustained ventricular tachycardia ([place]) 05/23/2013  . CAD (coronary artery disease) 01/08/2013  . Cardiomyopathy, ischemic 01/08/2013  . Hyperlipidemia 01/08/2013  . LBBB (left bundle branch block) 01/08/2013    Debbe Odea, PT,DPT 01/22/2020, 3:32 PM  [facility] Physical Therapy [address] [place], [place], [address] Phone: [phone]   Fax:  [phone]  Name: Paul Frazier. MRN: [id number] Date of Birth: 04-Oct-1932

## 2020-01-22 NOTE — ED Notes (Signed)
Patient has skin tears to bilateral arms and blisters to bilateral hands.

## 2020-01-22 NOTE — ED Triage Notes (Signed)
Pt BIBA from home. Pt was found by neighbor in driveway, sitting on ground. Pt had tried to go to [employer], lost footing, and fell. Bilateral arm abrasions. Pt c/o palm pain. Pt fell face first, catching self with hands. Pt was outside for 20 minutes.  Pt also had another fall earlier at his orthopedist office, where he was cleared.  2nd fall was using his cane. Pt has not been using walker at home.  140/110 76 95% 91FSBG 97.7  Pt does take blood thinners (plavix). No LOC, did not hit head.

## 2020-01-24 ENCOUNTER — Encounter: Payer: Medicare Other | Admitting: Rehabilitative and Restorative Service Providers"

## 2020-01-24 ENCOUNTER — Encounter: Payer: Medicare Other | Admitting: Physical Therapy

## 2020-01-28 ENCOUNTER — Encounter: Payer: Medicare Other | Admitting: Physical Therapy

## 2020-01-29 NOTE — Progress Notes (Deleted)
Assessment and Plan:  There are no diagnoses linked to this encounter.    Further disposition pending results of labs. Discussed med's effects and SE's.   Over 30 minutes of exam, counseling, chart review, and critical decision making was performed.   Future Appointments  Date Time Provider [place]  01/30/2020  2:30 PM Liane Comber, NP GA[facility] None  02/04/2020  1:45 PM Isaias Cowman, PT OC-OPT None  02/06/2020  2:30 PM Debbe Odea, PT OC-OPT None  02/07/2020  4:00 PM Croitoru, Dani Gobble, MD [facility]  02/11/2020  2:30 PM Elsie Ra R, PT OC-OPT None  02/13/2020  2:30 PM Elsie Ra R, PT OC-[facility] None  02/18/2020  2:30 PM Elsie Ra R, PT OC[facility] None  02/20/2020  2:30 PM Elsie Ra R, PT OC[facility] None  02/25/2020  2:30 PM Elsie Ra R, PT OC-OPT None  02/27/2020  2:30 PM Elsie Ra R, PT OC-OPT None  03/03/2020  2:30 PM Elsie Ra R, PT OC-OPT None  03/05/2020  2:30 PM Elsie Ra R, PT OC-[facility] None  03/10/2020  2:30 PM Elsie Ra R, PT OC[facility] None  03/12/2020  2:30 PM Elsie Ra R, PT OC[facility] None  03/17/2020  2:30 PM Elsie Ra R, PT OC[facility] None  03/19/2020  8:35 AM [facility] DEVICE REMOTES [facility]  03/19/2020  2:30 PM Debbe Odea, PT OC-[facility] None  05/28/2020  3:00 PM Unk Pinto, MD [facility] None  06/18/2020  8:35 AM [facility] DEVICE REMOTES [facility]  09/17/2020  8:35 AM [facility] DEVICE REMOTES [facility]  12/17/2020  8:35 AM [facility] DEVICE REMOTES [facility]    ------------------------------------------------------------------------------------------------------------------   HPI There were no vitals taken for this visit.  86 y.o.male with a. Fib on elequis, AVB s/p pacemaker, hx of imbalance with spinal stenosis and distal neuropathy (declined surgery due to risks, PT has been helpful ***) presents for evaluation after injury to arm after a fall  ***       Wife, [name], had a stroke, having some aphasia and she is now at [place], has been able to go in and see her more frequently, trying to get her on [place]. States his daughter (step daughter) is helping a lot, they have an elder Engineer, mining.   He has history of imbalance and essential tremor (takes valium PRN sparingly), follows with Dr. Rexene Alberts. MRI showed spinal stenosis, was referred to ortho but ultimately declined surgery. He does have persistent distal neuropathy. Did PT a few years ago per the patient which was helpful.  Does report some falls this past year without notable injury.   He has history of coronary artery disease and previous bypass surgery in 1986 with subsequent graft failure and myocardial infarction, had stent in 2001. Moderate to severely depressed left ventricular systolic function with EF around 30% and well compensated heart failure, nonsustained ventricular tachycardia, atrial flutter status post radiofrequency ablation, paroxysmal atrial fibrillation, left bundle branch block, second-degree atrioventricular block, s/p pacemaker in August 2019. Continues to follow with Dr. Sallyanne Kuster.    Past Medical History:  Diagnosis Date  . Arthritis   . CAD (coronary artery disease)   . Hyperlipidemia   . Hypertension   . Old myocardial infarct   . Right inguinal hernia 04/19/2012  . S/P CABG x 5 08/29/1984   LIMA to LAD,SVG to diagonal,SVG to OM,SVG to PDA and PLA  . Tremor, essential      Allergies  Allergen Reactions  . Erythromycin Nausea Only    Current Outpatient  Medications on File Prior to Visit  Medication Sig  . acetaminophen (TYLENOL) 325 MG tablet Take 650 mg by mouth every 6 (six) hours as needed.  [name] apixaban (ELIQUIS) 5 MG TABS tablet Take 1 tablet (5 mg total) by mouth 2 (two) times daily.  [name] atorvastatin (LIPITOR) 40 MG tablet Take 1 tablet daily for Cholesterol  . buPROPion (WELLBUTRIN XL) 300 MG 24 hr tablet Take 1 tablet daily for Mood  .  citalopram (CELEXA) 40 MG tablet Take 1 tablet (40 mg total) by mouth daily.  [name] co-enzyme Q-10 30 MG capsule Take 30 mg by mouth daily.   . diazepam (VALIUM) 5 MG tablet Take 1/2 to 1 tablet 2 to 3 x / day only if needed for Muscle spasms  & please try to limit to 5 days /week to avoid addiction  . Digestive Enzymes CAPS Take 1-2 capsules by mouth as needed (to aid digestive function).   . gabapentin (NEURONTIN) 300 MG capsule Take 1 capsule 3 x /day as needed for Chronic Pain  . hydrochlorothiazide (HYDRODIURIL) 25 MG tablet TAKE 1 TABLET DAILY ONLY  IF  NEEDED  FOR  ANKLE  SWELLING  . HYDROcodone-acetaminophen (NORCO/VICODIN) 5-325 MG tablet Take 1-2 tablets by mouth every 4 (four) hours as needed.  [name] losartan (COZAAR) 25 MG tablet Take 0.5 tablets (12.5 mg total) by mouth daily.  . Magnesium Chloride (MAGNESIUM DR PO) Take 2 tablets by mouth daily.   . metoprolol succinate (TOPROL-XL) 25 MG 24 hr tablet Take 1 tablet Daily for BP & Heart  . nitroGLYCERIN (NITROSTAT) 0.4 MG SL tablet Place 1 tablet (0.4 mg total) under the tongue every 5 (five) minutes as needed for chest pain.   No current facility-administered medications on file prior to visit.    ROS: all negative except above.   Physical Exam:  There were no vitals taken for this visit.  General Appearance: Well nourished, in no apparent distress. Eyes: PERRLA, EOMs, conjunctiva no swelling or erythema Sinuses: No Frontal/maxillary tenderness ENT/Mouth: Ext aud canals clear, TMs without erythema, bulging. No erythema, swelling, or exudate on post pharynx.  Tonsils not swollen or erythematous. Hearing normal.  Neck: Supple, thyroid normal.  Respiratory: Respiratory effort normal, BS equal bilaterally without rales, rhonchi, wheezing or stridor.  Cardio: RRR with no MRGs. Brisk peripheral pulses without edema.  Abdomen: Soft, + BS.  Non tender, no guarding, rebound, hernias, masses. Lymphatics: Non tender without lymphadenopathy.   Musculoskeletal: Full ROM, 5/5 strength, normal gait.  Skin: Warm, dry without rashes, lesions, ecchymosis.  Neuro: Cranial nerves intact. Normal muscle tone, no cerebellar symptoms. Sensation intact.  Psych: Awake and oriented X 3, normal affect, Insight and Judgment appropriate.     Izora Ribas, NP 3:22 PM [facility]

## 2020-01-30 ENCOUNTER — Encounter: Payer: Medicare Other | Admitting: Physical Therapy

## 2020-01-30 ENCOUNTER — Ambulatory Visit: Payer: Medicare Other | Admitting: Adult Health

## 2020-01-31 ENCOUNTER — Ambulatory Visit (INDEPENDENT_AMBULATORY_CARE_PROVIDER_SITE_OTHER): Payer: Medicare Other | Admitting: Internal Medicine

## 2020-01-31 ENCOUNTER — Other Ambulatory Visit: Payer: Self-pay

## 2020-01-31 VITALS — BP 104/56 | HR 72 | Temp 97.0°F | Resp 16 | Ht 66.0 in | Wt 178.2 lb

## 2020-01-31 DIAGNOSIS — T07XXXA Unspecified multiple injuries, initial encounter: Secondary | ICD-10-CM

## 2020-01-31 DIAGNOSIS — I255 Ischemic cardiomyopathy: Secondary | ICD-10-CM

## 2020-02-01 ENCOUNTER — Encounter: Payer: Self-pay | Admitting: Internal Medicine

## 2020-02-01 NOTE — Progress Notes (Signed)
History of Present Illness:      Patient is an 86 yo WWM who tripped in his driveway going to the mailbox 1 week previous on July 6 and was helped up by a passer-by until EMS came & took him to ER for Evaluation. Patient is on Eliquis, but denies any LOC or head injury.     Medications  .  atorvastatin (LIPITOR) 40 MG tablet, Take 1 tablet daily for Cholesterol .  hydrochlorothiazide (HYDRODIURIL) 25 MG tablet, TAKE 1 TABLET DAILY ONLY  IF  NEEDED  FOR  ANKLE  SWELLING .  metoprolol succinate (TOPROL-XL) 25 MG 24 hr tablet, Take 1 tablet Daily for BP & Heart .  nitroGLYCERIN (NITROSTAT) 0.4 MG SL tablet, Place 1 tablet (0.4 mg total) under the tongue every 5 (five) minutes as needed for chest pain. [name]  losartan (COZAAR) 25 MG tablet, Take 0.5 tablets (12.5 mg total) by mouth daily. [name]  acetaminophen (TYLENOL) 325 MG tablet, Take 650 mg by mouth every 6 (six) hours as needed. [name]  HYDROcodone-acetaminophen (NORCO/VICODIN) 5-325 MG tablet, Take 1-2 tablets by mouth every 4 (four) hours as needed. (Patient not taking: Reported on 01/31/2020) .  apixaban (ELIQUIS) 5 MG TABS tablet, Take 1 tablet (5 mg total) by mouth 2 (two) times daily. [name]  buPROPion (WELLBUTRIN XL) 300 MG 24 hr tablet, Take 1 tablet daily for Mood .  citalopram (CELEXA) 40 MG tablet, Take 1 tablet (40 mg total) by mouth daily. [name]  co-enzyme Q-10 30 MG capsule, Take 30 mg by mouth daily.  .  diazepam (VALIUM) 5 MG tablet, Take 1/2 to 1 tablet 2 to 3 x / day only if needed for Muscle spasms  & please try to limit to 5 days /week to avoid addiction .  Digestive Enzymes CAPS, Take 1-2 capsules by mouth as needed (to aid digestive function).  .  gabapentin (NEURONTIN) 300 MG capsule, Take 1 capsule 3 x /day as needed for Chronic Pain .  Magnesium Chloride (MAGNESIUM DR PO), Take 2 tablets by mouth daily.   Problem list He has CAD (coronary artery disease); Cardiomyopathy, ischemic; Hyperlipidemia; LBBB (left bundle branch block);  Nonsustained ventricular tachycardia ([place]); Essential hypertension; Vitamin D deficiency; Medication management; Other abnormal glucose; Major depression in full remission ([place]); History of basal cell cancer; Atherosclerosis of aorta ([place]); Paroxysmal atrial flutter ([place]); Complete heart block ([place]); Senile purpura ([place]); Lumbar pain; Chronic combined systolic and diastolic heart failure ([place]); Pacemaker; History of transient ischemic attack (TIA); Acquired thrombophilia ([place]); At risk for falls; and Unsteady gait on their problem list.   Observations/Objective:  BP  104/56   P 72   )T 97 F   R 16   Ht 5\' 6"     Wt 178 lb   BMI 28.76  Postural Sit BP 128/60   P 64      & Stand BP 123/73   P 70  HEENT - WNL. Neck - supple.  Chest - Clear equal BS. Cor - Nl HS. RRR w/o sig MGR. PP 1(+). No edema. MS- FROM w/o deformities.  Gait Nl. Neuro -  Nl w/o focal abnormalities. Skin - Dry Abrasion approx 1.5 ' over each knee and also  Abrasions od dorsal Rt forearm w/o sign of infection.  Assessment and Plan:  1. Abrasions of multiple sites  - Advised continue same wound care until scabs fall off      I discussed the assessment and treatment plan with the patient. The patient was  provided an opportunity to ask questions and all were answered. The patient agreed with the plan and demonstrated an understanding of the instructions.       The patient was advised to call back or seek an in-person evaluation if the symptoms worsen or if the condition fails to improve as anticipated.  Kirtland Bouchard, MD

## 2020-02-04 ENCOUNTER — Other Ambulatory Visit: Payer: Self-pay

## 2020-02-04 ENCOUNTER — Ambulatory Visit (INDEPENDENT_AMBULATORY_CARE_PROVIDER_SITE_OTHER): Payer: Medicare Other | Admitting: Physical Therapy

## 2020-02-04 ENCOUNTER — Encounter: Payer: Self-pay | Admitting: Physical Therapy

## 2020-02-04 ENCOUNTER — Encounter: Payer: Medicare Other | Admitting: Rehabilitative and Restorative Service Providers"

## 2020-02-04 DIAGNOSIS — M79604 Pain in right leg: Secondary | ICD-10-CM | POA: Diagnosis not present

## 2020-02-04 DIAGNOSIS — R293 Abnormal posture: Secondary | ICD-10-CM

## 2020-02-04 DIAGNOSIS — R296 Repeated falls: Secondary | ICD-10-CM

## 2020-02-04 DIAGNOSIS — R2689 Other abnormalities of gait and mobility: Secondary | ICD-10-CM

## 2020-02-04 DIAGNOSIS — R2681 Unsteadiness on feet: Secondary | ICD-10-CM | POA: Diagnosis not present

## 2020-02-04 DIAGNOSIS — M79605 Pain in left leg: Secondary | ICD-10-CM | POA: Diagnosis not present

## 2020-02-04 DIAGNOSIS — M6281 Muscle weakness (generalized): Secondary | ICD-10-CM

## 2020-02-04 NOTE — Therapy (Signed)
[facility] Physical Therapy [address] [place], [place], [address] Phone: [phone]   Fax:  [phone]  Physical Therapy Treatment  Patient Details  Name: Paul Frazier. MRN: [id number] Date of Birth: 10-15-1932 Referring Provider (PT): Liane Comber, NP   Encounter Date: 02/04/2020   PT End of Session - 02/04/20 1454    Visit Number 9    Number of Visits 25    Date for PT Re-Evaluation 03/20/20    Authorization Type Medicare A & B and AARP    PT Start Time 1350    PT Stop Time 1436    PT Time Calculation (min) 46 min    Activity Tolerance No increased pain;Patient limited by fatigue    Behavior During Therapy [facility] for tasks assessed/performed           Past Medical History:  Diagnosis Date  . Arthritis   . CAD (coronary artery disease)   . Hyperlipidemia   . Hypertension   . Old myocardial infarct   . Right inguinal hernia 04/19/2012  . S/P CABG x 5 08/29/1984   LIMA to LAD,SVG to diagonal,SVG to OM,SVG to PDA and PLA  . Tremor, essential     Past Surgical History:  Procedure Laterality Date  . A-FLUTTER ABLATION N/A 03/09/2018   Procedure: A-FLUTTER ABLATION;  Surgeon: Evans Lance, MD;  Location: [place] [facility];  Service: Cardiovascular;  Laterality: N/A;  . CHOLECYSTECTOMY  10/1990  . CORONARY ARTERY BYPASS GRAFT  1986   x5 LIMA to LAD,SVG to diagonal,SVG to OM,SVG to PDA and PLA  . CORONARY STENT PLACEMENT    . INGUINAL HERNIA REPAIR  04/25/2012   right- laparoscopic  . PACEMAKER IMPLANT N/A 03/09/2018   Procedure: PACEMAKER IMPLANT;  Surgeon: Evans Lance, MD;  Location: [facility];  Service: Cardiovascular;  Laterality: N/A;  . R/P Myoview  12/27/2008   no ischemia  . TEE WITHOUT CARDIOVERSION N/A 03/09/2018   Procedure: TRANSESOPHAGEAL ECHOCARDIOGRAM (TEE);  Surgeon: Fay Records, MD;  Location: [facility];  Service: Cardiovascular;  Laterality: N/A;  Bubble Study  . US ECHOCARDIOGRAPHY  12/27/2008    concentric LVH,mild to mod MR, TR and pulmonary hypertension. EF 45-50%    There were no vitals filed for this visit.   Subjective Assessment - 02/04/20 1350    Subjective He fell on street at his house taking trash out and was turning around. A bystander called 911 and he went to ED. He has scrapes / lacerations to right forearm, both hands & both knees.  He has been using rollator walker that was his wife's and stored in attic    Pertinent History spinal stenosis/radiculopathy, chronic peripheral neuropathy, CHF, CAD, complete heart block, pacemaker, basal cell CA, atheroscelerosis on Aorta    Patient Stated Goals To improve balance to stop falls.    Currently in Pain? Yes    Pain Score 2     Pain Location Other (Comment)   forearm   Pain Orientation Right    Pain Descriptors / Indicators Burning    Pain Type Acute pain    Pain Onset 1 to 4 weeks ago    Aggravating Factors  scrape from fall    Pain Relieving Factors medication                             [facility] Adult PT Treatment/Exercise - 02/04/20 1350      Transfers  Transfers Sit to Stand;Stand to [employer] Transfers    Sit to Stand 5: Supervision;With upper extremity assist;With armrests;From chair/3-in-1;Other (comment)   to locked rollator walker   Sit to Stand Details Verbal cues for technique;Verbal cues for safe use of DME/AE;Visual cues for safe use of DME/AE    Stand to Sit 5: Supervision;With upper extremity assist;To chair/3-in-1;With armrests   from locked rollator walker   Stand to Sit Details (indicate cue type and reason) Visual cues for safe use of DME/AE;Verbal cues for technique;Verbal cues for safe use of DME/AE    Stand Pivot Transfers 5: Supervision   turning 180* sit/stand rollator seat   Stand Pivot Transfer Details (indicate cue type and reason) verbal & demo cues on technique with rollator walker      Ambulation/Gait   Ambulation/Gait Yes    Ambulation/Gait Assistance 5:  Supervision    Ambulation/Gait Assistance Details PT demo & verbal cues on safety with use of rollator walker including negotiating obstacles, brakes on rollator walker and positioning within rollator walker.      Ambulation Distance (Feet) 250 Feet   150' X 2 indoors, 250' to car end of session   Assistive device Rollator    Gait Pattern Step-through pattern;Decreased arm swing - left;Decreased hip/knee flexion - right;Decreased hip/knee flexion - left;Right flexed knee in stance;Left flexed knee in stance;Shuffle;Antalgic;Lateral hip instability;Trunk flexed    Ambulation Surface Level;Indoor;Outdoor;Paved    Ramp 5: Supervision;Other (comment)   Min guard 1st & supervision 2nd with locked rollator walker   Ramp Details (indicate cue type and reason) PT demo & verbal cues on technique with locked rollator walker    Curb 5: Supervision   Min guard 1st & supervision 2nd with locked rollator walker   Curb Details (indicate cue type and reason) PT demo & verbal cues on technique with locked rollator walker      Therapeutic Activites    Other Therapeutic Activities Pt demo how he was loading rollator walker into his truck with covered bed. 1st into back but was too off balance then into passenger seat but unsafe technique with unstable balance.  PT demo technique with patient positioning his back to open car door and lifting rollator walker by lateral uprights instead of seat.  Pt return demo with improved safety. Pt ambulated to driver's side with touch on truck.                      PT Short Term Goals - 02/04/20 2118      PT SHORT TERM GOAL #1   Title Patient verbalizes & demonstrates understanding on initial HEP.  (All STGs Target Date: 01/24/2020)    Baseline NOT MET 02/04/2020    Time 1    Period Months    Status Not Met    Target Date 01/24/20      PT SHORT TERM GOAL #2   Title Patient ambulates with head turns to scan environment and maintains path.    Baseline NOT MET  02/04/2020    Time 1    Period Months    Status Not Met    Target Date 01/24/20      PT SHORT TERM GOAL #3   Title Patient able to stand reaching 10" anteriorly and scans right/left/up/down without loss of balance with supervision.    Baseline NOT MET 02/04/2020    Time 1    Period Months    Status Not Met    Target Date 01/24/20  PT SHORT TERM GOAL #4   Title Patient reports >20% improvement in pain with activiites.    Baseline NOT MET 02/04/2020    Time 1    Period Months    Status Not Met    Target Date 01/24/20             PT Short Term Goals - 02/04/20 2144      PT SHORT TERM GOAL #1   Title Patient verbalizes & demonstrates understanding on updated HEP.  (All STGs Target Date: 02/29/2020)    Time 1    Period Months    Status Revised    Target Date 02/29/20      PT SHORT TERM GOAL #2   Title Patient ambulates with rollator walker negotiating obstacles with supervision.    Time 1    Period Months    Status Revised    Target Date 02/29/20      PT SHORT TERM GOAL #3   Title Patient able to pick up items from floor and reaches 10" with locked rollator walker support with supervision.    Time 1    Period Months    Status Revised    Target Date 02/29/20      PT SHORT TERM GOAL #4   Title Patient reports >20% improvement in pain with activiites.    Time 1    Period Months    Status On-going    Target Date 02/29/20      PT SHORT TERM GOAL #5   Title Patient negotiates ramps & curbs with rollator walker with supervision.    Time 4    Period Weeks    Status New    Target Date 02/29/20             PT Long Term Goals - 02/04/20 2115      PT LONG TERM GOAL #1   Title Patient verbalizes & demonstrates understanding of ongoing HEP / fitness plan.  (All LTGs Target Date: 03/21/2020)    Time 12    Period Weeks    Status On-going    Target Date 03/21/20      PT LONG TERM GOAL #2   Title [place] Balance Tasks > 45/56 with light touch on walker    Time 12      Period Weeks    Status Revised    Target Date 03/21/20      PT LONG TERM GOAL #3   Title Functional Gait Assessment >/= 19/30 with rollator walker indicating lower fall risk.    Time 12    Period Weeks    Status Revised    Target Date 03/21/20      PT LONG TERM GOAL #4   Title Patient verbalizes & demonstrates understanding how to decrease leg pain to improve standing & gait tolerance.    Time 12    Period Weeks    Status On-going    Target Date 03/21/20      PT LONG TERM GOAL #5   Title Patient ambulates 300' & negotiates ramps/curbs with rollator walker modified independent.    Time 12    Period Weeks    Status Revised    Target Date 03/21/20                 Plan - 02/04/20 2124    Clinical Impression Statement PT instructed in use of rollator walker including ramps, curbs & loading in / out car.  Patient will need further instruction but improved  his safety.    Personal Factors and Comorbidities Age;Comorbidity 3+;Fitness;Past/Current Experience    Comorbidities spinal stenosis/radiculopathy, chronic peripheral neuropathy, CHF, CAD, complete heart block, pacemaker, basal cell CA, atheroscelerosis on Aorta,    Examination-Activity Limitations Locomotion Level;Stairs;Stand;Transfers    Examination-Participation Restrictions Community Activity    Stability/Clinical Decision Making Stable/Uncomplicated    Rehab Potential Good    PT Frequency 2x / week    PT Duration 12 weeks    PT Treatment/Interventions ADLs/Self Care Home Management;Cryotherapy;Electrical Stimulation;Moist Heat;Ultrasound;DME Instruction;Gait training;Stair training;Functional mobility training;Therapeutic activities;Therapeutic exercise;Balance training;Neuromuscular re-education;Patient/family education;Manual techniques;Passive range of motion;Dry needling;Vestibular    PT Next Visit Plan therapeutic exercises & activities for balance & strength. gait training to improve push-off.    PT Home  Exercise Plan Access Code: [email or web address]    Consulted and Agree with Plan of Care Patient           Patient will benefit from skilled therapeutic intervention in order to improve the following deficits and impairments:  Abnormal gait, Decreased activity tolerance, Cardiopulmonary status limiting activity, Decreased balance, Decreased coordination, Decreased endurance, Decreased knowledge of use of DME, Decreased mobility, Decreased range of motion, Decreased strength, Impaired flexibility, Postural dysfunction, Pain  Visit Diagnosis: Other abnormalities of gait and mobility  Unsteadiness on feet  Abnormal posture  Muscle weakness (generalized)  Pain in left leg  Pain in right leg  Repeated falls     Problem List Patient Active Problem List   Diagnosis Date Noted  . At risk for falls 12/04/2019  . Unsteady gait 12/04/2019  . Acquired thrombophilia ([place]) 07/23/2019  . Chronic combined systolic and diastolic heart failure ([place]) 08/29/2018  . Pacemaker 08/29/2018  . History of transient ischemic attack (TIA) 08/29/2018  . Lumbar pain 06/09/2018  . Senile purpura ([place]) 05/11/2018  . Complete heart block ([place])   . Paroxysmal atrial flutter ([place]) 03/06/2018  . Atherosclerosis of aorta ([place]) 08/23/2016  . History of basal cell cancer 08/01/2015  . Major depression in full remission ([place]) 09/11/2014  . Essential hypertension 01/01/2014  . Vitamin D deficiency 01/01/2014  . Medication management 01/01/2014  . Other abnormal glucose 01/01/2014  . Nonsustained ventricular tachycardia ([place]) 05/23/2013  . CAD (coronary artery disease) 01/08/2013  . Cardiomyopathy, ischemic 01/08/2013  . Hyperlipidemia 01/08/2013  . LBBB (left bundle branch block) 01/08/2013    Jamey Reas, PT, DPT 02/04/2020, 9:34 PM  [facility] Physical Therapy [address] [place], [place], [address] Phone: [phone]   Fax:  [phone]  Name: Paul Frazier. MRN:  [id number] Date of Birth: 08-09-1932

## 2020-02-06 ENCOUNTER — Ambulatory Visit (INDEPENDENT_AMBULATORY_CARE_PROVIDER_SITE_OTHER): Payer: Medicare Other | Admitting: Physical Therapy

## 2020-02-06 ENCOUNTER — Encounter: Payer: Self-pay | Admitting: Physical Therapy

## 2020-02-06 ENCOUNTER — Other Ambulatory Visit: Payer: Self-pay

## 2020-02-06 DIAGNOSIS — R531 Weakness: Secondary | ICD-10-CM | POA: Diagnosis not present

## 2020-02-06 DIAGNOSIS — M6281 Muscle weakness (generalized): Secondary | ICD-10-CM | POA: Diagnosis not present

## 2020-02-06 DIAGNOSIS — M79605 Pain in left leg: Secondary | ICD-10-CM

## 2020-02-06 DIAGNOSIS — R296 Repeated falls: Secondary | ICD-10-CM | POA: Diagnosis not present

## 2020-02-06 DIAGNOSIS — M79604 Pain in right leg: Secondary | ICD-10-CM | POA: Diagnosis not present

## 2020-02-06 DIAGNOSIS — R2681 Unsteadiness on feet: Secondary | ICD-10-CM

## 2020-02-06 DIAGNOSIS — R2689 Other abnormalities of gait and mobility: Secondary | ICD-10-CM

## 2020-02-06 DIAGNOSIS — R293 Abnormal posture: Secondary | ICD-10-CM

## 2020-02-06 NOTE — Therapy (Signed)
[facility] Physical Therapy [address] [place], [place], [address] Phone: [phone]   Fax:  [phone]  Physical Therapy Treatment/Progress note  Progress Note reporting period 12/24/19 to 02/06/20  See below for objective and subjective measurements relating to patients progress with PT.   Patient Details  Name: Paul Frazier. MRN: [id number] Date of Birth: 03-28-1933 Referring Provider (PT): Judd Gaudier, NP   Encounter Date: 02/06/2020   PT End of Session - 02/06/20 1553    Visit Number 10    Number of Visits 25    Date for PT Re-Evaluation 03/20/20    Authorization Type Medicare A & B and AARP    PT Start Time 1435    PT Stop Time 1515    PT Time Calculation (min) 40 min    Activity Tolerance No increased pain;Patient limited by fatigue    Behavior During Therapy [facility] for tasks assessed/performed           Past Medical History:  Diagnosis Date  . Arthritis   . CAD (coronary artery disease)   . Hyperlipidemia   . Hypertension   . Old myocardial infarct   . Right inguinal hernia 04/19/2012  . S/P CABG x 5 08/29/1984   LIMA to LAD,SVG to diagonal,SVG to OM,SVG to PDA and PLA  . Tremor, essential     Past Surgical History:  Procedure Laterality Date  . A-FLUTTER ABLATION N/A 03/09/2018   Procedure: A-FLUTTER ABLATION;  Surgeon: Marinus Maw, MD;  Location: [facility] INVASIVE CV LAB;  Service: Cardiovascular;  Laterality: N/A;  . CHOLECYSTECTOMY  10/1990  . CORONARY ARTERY BYPASS GRAFT  1986   x5 LIMA to LAD,SVG to diagonal,SVG to OM,SVG to PDA and PLA  . CORONARY STENT PLACEMENT    . INGUINAL HERNIA REPAIR  04/25/2012   right- laparoscopic  . PACEMAKER IMPLANT N/A 03/09/2018   Procedure: PACEMAKER IMPLANT;  Surgeon: Marinus Maw, MD;  Location: [facility] INVASIVE CV LAB;  Service: Cardiovascular;  Laterality: N/A;  . R/P Myoview  12/27/2008   no ischemia  . TEE WITHOUT CARDIOVERSION N/A 03/09/2018   Procedure: TRANSESOPHAGEAL ECHOCARDIOGRAM  (TEE);  Surgeon: Pricilla Riffle, MD;  Location: [facility] ENDOSCOPY;  Service: Cardiovascular;  Laterality: N/A;  Bubble Study  . US ECHOCARDIOGRAPHY  12/27/2008   concentric LVH,mild to mod MR, TR and pulmonary hypertension. EF 45-50%    There were no vitals filed for this visit.   Subjective Assessment - 02/06/20 1545    Subjective he relays compliance with rollator but he hates he has to use it    Pertinent History spinal stenosis/radiculopathy, chronic peripheral neuropathy, CHF, CAD, complete heart block, pacemaker, basal cell CA, atheroscelerosis on Aorta    Patient Stated Goals To improve balance to stop falls.    Pain Onset 1 to 4 weeks ago             [facility] Adult PT Treatment/Exercise - 02/06/20 0001      Transfers   Transfers Sit to Stand;Stand to Sit;Stand Pivot Transfers    Sit to Stand 5: Supervision;With upper extremity assist;With armrests;From chair/3-in-1;Other (comment)   to locked rollator walker   Sit to Stand Details Verbal cues for technique;Verbal cues for safe use of DME/AE;Visual cues for safe use of DME/AE    Stand to Sit 5: Supervision;With upper extremity assist;To chair/3-in-1;With armrests   from locked rollator walker   Stand to Sit Details (indicate cue type and reason) Visual cues for safe use of DME/AE;Verbal cues for technique;Verbal  cues for safe use of DME/AE    Stand Pivot Transfers 5: Supervision   turning 180* sit/stand rollator seat     Ambulation/Gait   Ambulation/Gait Yes    Ambulation/Gait Assistance 5: Supervision    Ambulation Distance (Feet) 250 Feet   150' X 2 indoors, 250' to car end of session   Assistive device Rollator    Gait Pattern Step-through pattern;Decreased arm swing - left;Decreased hip/knee flexion - right;Decreased hip/knee flexion - left;Right flexed knee in stance;Left flexed knee in stance;Shuffle;Antalgic;Lateral hip instability;Trunk flexed    Ramp 5: Supervision;Other (comment)   Min guard 1st & supervision 2nd with locked  rollator walker   Ramp Details (indicate cue type and reason) PT demo & verbal cues on technique with locked rollator walker    Curb 5: Supervision   Min guard 1st & supervision 2nd with locked rollator walker   Curb Details (indicate cue type and reason) PT demo & verbal cues on technique with locked rollator walker      High Level Balance   High Level Balance Comments In bars: balance on airex pad feet together and feet apart with head turns, retro walking and sidestepping. Sit to stands no UE support 3 sets of 5      Therapeutic Activites    Other Therapeutic Activities Pt demo how he was loading rollator walker into his truck with covered bed. 1st into back but was too off balance then into passenger seat but unsafe technique with unstable balance.  PT demo technique with patient positioning his back to open car door and lifting rollator walker by lateral uprights instead of seat.  Pt return demo with improved safety. Pt ambulated to driver's side with touch on truck.                    PT Education - 02/06/20 1552    Education Details importance of using Assistive device to minimize falls risk    Person(s) Educated Patient    Methods Explanation    Comprehension Verbalized understanding            PT Short Term Goals - 02/06/20 1554      PT SHORT TERM GOAL #1   Title Patient verbalizes & demonstrates understanding on updated HEP.  (All STGs Target Date: 02/29/2020)    Baseline NOT MET 02/04/2020    Time 1    Period Months    Status Revised    Target Date 02/29/20      PT SHORT TERM GOAL #2   Title Patient ambulates with rollator walker negotiating obstacles with supervision.    Time 1    Period Months    Status Revised    Target Date 02/29/20      PT SHORT TERM GOAL #3   Title Patient able to pick up items from floor and reaches 10" with locked rollator walker support with supervision.    Time 1    Period Months    Status Revised    Target Date 02/29/20       PT SHORT TERM GOAL #4   Title Patient reports >20% improvement in pain with activiites.    Time 1    Period Months    Status On-going    Target Date 02/29/20      PT SHORT TERM GOAL #5   Title Patient negotiates ramps & curbs with rollator walker with supervision.    Time 4    Period Weeks    Status New  Target Date 02/29/20             PT Long Term Goals - 02/04/20 2115      PT LONG TERM GOAL #1   Title Patient verbalizes & demonstrates understanding of ongoing HEP / fitness plan.  (All LTGs Target Date: 03/21/2020)    Time 12    Period Weeks    Status On-going    Target Date 03/21/20      PT LONG TERM GOAL #2   Title Berg Balance Tasks > 45/56 with light touch on walker    Time 12    Period Weeks    Status Revised    Target Date 03/21/20      PT LONG TERM GOAL #3   Title Functional Gait Assessment >/= 19/30 with rollator walker indicating lower fall risk.    Time 12    Period Weeks    Status Revised    Target Date 03/21/20      PT LONG TERM GOAL #4   Title Patient verbalizes & demonstrates understanding how to decrease leg pain to improve standing & gait tolerance.    Time 12    Period Weeks    Status On-going    Target Date 03/21/20      PT LONG TERM GOAL #5   Title Patient ambulates 300' & negotiates ramps/curbs with rollator walker modified independent.    Time 12    Period Weeks    Status Revised    Target Date 03/21/20                 Plan - 02/06/20 1555    Clinical Impression Statement Overall progress is slow with PT and has been slowed more by recent falls. Goals revised to include rollator as this is safer for ambulation and helps to minimize falls risk. He continues to lack balance, endurance and leg strength and PT is working to progress this as able.    Personal Factors and Comorbidities Age;Comorbidity 3+;Fitness;Past/Current Experience    Comorbidities spinal stenosis/radiculopathy, chronic peripheral neuropathy, CHF, CAD, complete  heart block, pacemaker, basal cell CA, atheroscelerosis on Aorta,    Examination-Activity Limitations Locomotion Level;Stairs;Stand;Transfers    Examination-Participation Restrictions Community Activity    Stability/Clinical Decision Making Stable/Uncomplicated    Rehab Potential Good    PT Frequency 2x / week    PT Duration 12 weeks    PT Treatment/Interventions ADLs/Self Care Home Management;Cryotherapy;Electrical Stimulation;Moist Heat;Ultrasound;DME Instruction;Gait training;Stair training;Functional mobility training;Therapeutic activities;Therapeutic exercise;Balance training;Neuromuscular re-education;Patient/family education;Manual techniques;Passive range of motion;Dry needling;Vestibular    PT Next Visit Plan review rollator walker use, therapuetic exercise & balance activities    PT Home Exercise Plan Access Code: [email or web address]    Consulted and Agree with Plan of Care Patient           Patient will benefit from skilled therapeutic intervention in order to improve the following deficits and impairments:  Abnormal gait, Decreased activity tolerance, Cardiopulmonary status limiting activity, Decreased balance, Decreased coordination, Decreased endurance, Decreased knowledge of use of DME, Decreased mobility, Decreased range of motion, Decreased strength, Impaired flexibility, Postural dysfunction, Pain  Visit Diagnosis: Other abnormalities of gait and mobility  Unsteadiness on feet  Abnormal posture  Muscle weakness (generalized)  Pain in left leg  Pain in right leg  Repeated falls  Weakness generalized     Problem List Patient Active Problem List   Diagnosis Date Noted  . At risk for falls 12/04/2019  . Unsteady gait 12/04/2019  . Acquired thrombophilia ([place]) 07/23/2019  .  Chronic combined systolic and diastolic heart failure ([place]) 08/29/2018  . Pacemaker 08/29/2018  . History of transient ischemic attack (TIA) 08/29/2018  . Lumbar pain 06/09/2018  . Senile purpura  ([place]) 05/11/2018  . Complete heart block ([place])   . Paroxysmal atrial flutter ([place]) 03/06/2018  . Atherosclerosis of aorta ([place]) 08/23/2016  . History of basal cell cancer 08/01/2015  . Major depression in full remission ([place]) 09/11/2014  . Essential hypertension 01/01/2014  . Vitamin D deficiency 01/01/2014  . Medication management 01/01/2014  . Other abnormal glucose 01/01/2014  . Nonsustained ventricular tachycardia ([place]) 05/23/2013  . CAD (coronary artery disease) 01/08/2013  . Cardiomyopathy, ischemic 01/08/2013  . Hyperlipidemia 01/08/2013  . LBBB (left bundle branch block) 01/08/2013    Silvestre Mesi 02/06/2020, 3:57 PM  [facility] Physical Therapy [address] [place], [place], [address] Phone: [phone]   Fax:  [phone]  Name: Vernal Hritz. MRN: [id number] Date of Birth: 27-May-1933

## 2020-02-07 ENCOUNTER — Encounter: Payer: Self-pay | Admitting: Cardiovascular Disease

## 2020-02-07 ENCOUNTER — Ambulatory Visit (INDEPENDENT_AMBULATORY_CARE_PROVIDER_SITE_OTHER): Payer: Medicare Other | Admitting: Cardiovascular Disease

## 2020-02-07 VITALS — BP 103/64 | HR 73 | Ht 66.0 in

## 2020-02-07 DIAGNOSIS — I6501 Occlusion and stenosis of right vertebral artery: Secondary | ICD-10-CM | POA: Diagnosis not present

## 2020-02-07 DIAGNOSIS — I441 Atrioventricular block, second degree: Secondary | ICD-10-CM | POA: Diagnosis not present

## 2020-02-07 DIAGNOSIS — E876 Hypokalemia: Secondary | ICD-10-CM

## 2020-02-07 DIAGNOSIS — I10 Essential (primary) hypertension: Secondary | ICD-10-CM | POA: Diagnosis not present

## 2020-02-07 DIAGNOSIS — Z8679 Personal history of other diseases of the circulatory system: Secondary | ICD-10-CM

## 2020-02-07 DIAGNOSIS — I25118 Atherosclerotic heart disease of native coronary artery with other forms of angina pectoris: Secondary | ICD-10-CM | POA: Diagnosis not present

## 2020-02-07 DIAGNOSIS — I5042 Chronic combined systolic (congestive) and diastolic (congestive) heart failure: Secondary | ICD-10-CM | POA: Diagnosis not present

## 2020-02-07 DIAGNOSIS — I442 Atrioventricular block, complete: Secondary | ICD-10-CM

## 2020-02-07 DIAGNOSIS — E7800 Pure hypercholesterolemia, unspecified: Secondary | ICD-10-CM

## 2020-02-07 DIAGNOSIS — I255 Ischemic cardiomyopathy: Secondary | ICD-10-CM

## 2020-02-07 DIAGNOSIS — I480 Paroxysmal atrial fibrillation: Secondary | ICD-10-CM

## 2020-02-07 MED ORDER — FUROSEMIDE 40 MG PO TABS
40.0000 mg | ORAL_TABLET | Freq: Every day | ORAL | 1 refills | Status: DC
Start: 2020-02-07 — End: 2020-07-01

## 2020-02-07 MED ORDER — POTASSIUM CHLORIDE CRYS ER 20 MEQ PO TBCR
40.0000 meq | EXTENDED_RELEASE_TABLET | Freq: Every day | ORAL | 1 refills | Status: DC
Start: 2020-02-07 — End: 2020-03-25

## 2020-02-07 NOTE — Patient Instructions (Signed)
Medication Instructions:  Start Potassium 40MEQ (2 Tablets) today  Start Lasix (Furosemide) 40mg  Once Daily next Tuesday the 27th,  Stop Hydrochlorothiazide (HCTZ)   *If you need a refill on your cardiac medications before your next appointment, please call your pharmacy*   Lab Work: BMET- August 3rd  If you have labs (blood work) drawn today and your tests are completely normal, you will receive your results only by: [name] MyChart Message (if you have MyChart) OR . A paper copy in the mail If you have any lab test that is abnormal or we need to change your treatment, we will call you to review the results.   Testing/Procedures: None Ordered    Follow-Up: At [facility], you and your health needs are our priority.  As part of our continuing mission to provide you with exceptional heart care, we have created designated Provider Care Teams.  These Care Teams include your primary Cardiologist (physician) and Advanced Practice Providers (APPs -  Physician Assistants and Nurse Practitioners) who all work together to provide you with the care you need, when you need it.  We recommend signing up for the patient portal called "MyChart".  Sign up information is provided on this After Visit Summary.  MyChart is used to connect with patients for Virtual Visits (Telemedicine).  Patients are able to view lab/test results, encounter notes, upcoming appointments, etc.  Non-urgent messages can be sent to your provider as well.   To learn more about what you can do with MyChart, go to [email or web address].    Your next appointment:   You have an appointment September 7th at 4 PM  The format for your next appointment:   In Person  Provider:   Sanda Klein, MD   Other Instructions

## 2020-02-08 ENCOUNTER — Telehealth: Payer: Self-pay | Admitting: Licensed Clinical Social Worker

## 2020-02-08 NOTE — Telephone Encounter (Signed)
CSW consulted by pt Cardiologist office with concerns regarding medication management, home health RN needs, and mobility.    CSW reached out to discuss current needs and concerns and help connect to appropriate resources- unable to reach- left VM requesting return call  Jorge Ny, [facility] Desk#: [phone] Cell#: [phone]

## 2020-02-10 ENCOUNTER — Encounter: Payer: Self-pay | Admitting: Cardiovascular Disease

## 2020-02-10 NOTE — Progress Notes (Signed)
Patient ID: Paul Frazier., male   DOB: [birth year], 86 y.o.   MRN: [id number]      Cardiology Office Note   Date:  02/10/2020   ID:  Paul Frazier., DOB [birth year], MRN [id number]  PCP:  Unk Pinto, MD  Cardiologist:   Sanda Klein, MD   Chief Complaint  Patient presents with  . Fall  Pacemaker check, atrial flutter ablation, falls    History of Present Illness: Paul Tumolo. is a 86 y.o. male who presents for follow-up on multiple cardiac problems.  His wife Paul Frazier is also my patient, but has been unable to follow-up in a long time.  She had a stroke and has been a resident at [facility] for the last several months.  Diane is struggling to continue to live independently in his home.  He remains under a lot of emotional stress.  He has had a few falls recently.  He denies loss of consciousness but is very unsteady on his feet.  He actually fell in the parking lot of our office on his way to see [place] today.  He did not have any serious injury.  He has been troubled by lower extremity edema and his hydrochlorothiazide was increased to 50 mg.  He is borderline hypotensive today at 103/64 and his potassium was recently low at 3.2.  He still has 1+ bilateral ankle edema.  He denies dyspnea at rest or with activity and definitely does not have orthopnea or PND.  He has not had angina pectoris in a long time.  He is not aware of any palpitations.  His legs feel very weak.  He has not had serious bleeding problems despite his falls.  He has a dual-chamber pacemaker and his right ventricular lead is a His bundle/septal location.  His current generator was implanted in 2019 and has approximately 7 years of remaining longevity.  Lead parameters are good.  He has 81% atrial pacing and 99% ventricular pacing.  Underlying rhythm is atrial sensed, ventricular sensed (probably sinus bradycardia?) at about 40 bpm, with a long AV delay.  He has had infrequent episodes of atrial  fibrillation, overall burden less than 0.1% (2 hours in March, 31 minutes on July 9).  He has not had any problems with rapid ventricular rates during atrial fibrillation, but has had very rare episodes of nonsustained VT (longest 6 beats on July 17).  Activity level remains fairly good at about 2 hours/day.  He has a history of coronary artery disease and previous bypass surgery with subsequent graft failure and myocardial infarction, moderate to severely depressed left ventricular systolic function with EF around 30% and well compensated heart failure, nonsustained ventricular tachycardia, atrial flutter status post radiofrequency ablation, paroxysmal atrial fibrillation, left bundle branch block, second-degree atrioventricular block, pacemaker with septal His bundle ventricular lead (March 09, 2018, Dr. Lovena Le, [employer]tr[employer] Azure), history of subdural hematoma in 2013.  He had atrial flutter ablation, followed by development of complete heart block.  He underwent implantation of a dual-chamber permanent pacemaker with a His bundle septal ventricular lead on March 09, 2018.   Ventricular outputs were reduced to chronic settings today.  His estimated generator longevity after these changes is now 8.8 years.  Prior to his RF ablation he underwent a transesophageal echo on August 21 which showed left ventricular ejection fraction of 30-35% with inferior and inferoseptal akinesis and the mildly dilated left atrium, mild mitral regurgitation, moderate tricuspid regurgitation.  A nuclear stress test  performed on April 12, 2018 shows a similar pattern of LVEF 30% with inferior scar, incomplete reversible ischemia.  He has a complicated history of coronary disease and underwent bypass surgery in 1986. He has total occlusion of his LAD artery and his dominant left circumflex coronary artery is graft dependent. He presented with a moderate size non-ST segment elevation myocardial infarction in the setting of  paroxysmal supraventricular tachycardia in October 2012 and was treated in [place], [place]. The catheterization report is a little confusing since it describes grafts with different locations than those described at the time of his bypass procedure, however it appears that he has lost the sequential bypass to the oblique marginal artery. A more recent nuclear stress test (May 2014) showed that his left ventricular ejection fraction was down to 42% with inferolateral and anterolateral scar and hypokinesia but without any reversible ischemic abnormalities. I offered cardiac catheterization at that time since I was worried that he had developed disease in the sequential saphenous vein graft to the PDA/PLA, but he preferred conservative management.  A nuclear stress test in September 2019 shows basal-mid inferior, mostly fixed defect, EF 30%.  He underwent radiofrequency ablation for atrial flutter in August 6578, complicated by complete heart block and leading to implantation of a dual-chamber permanent pacemaker with a His bundle lead ([employer] Azure).  He had long-standing left bundle branch block before that.   Past Medical History:  Diagnosis Date  . Arthritis   . CAD (coronary artery disease)   . Hyperlipidemia   . Hypertension   . Old myocardial infarct   . Right inguinal hernia 04/19/2012  . S/P CABG x 5 08/29/1984   LIMA to LAD,SVG to diagonal,SVG to OM,SVG to PDA and PLA  . Tremor, essential     Past Surgical History:  Procedure Laterality Date  . A-FLUTTER ABLATION N/A 03/09/2018   Procedure: A-FLUTTER ABLATION;  Surgeon: Evans Lance, MD;  Location: [place] [facility];  Service: Cardiovascular;  Laterality: N/A;  . CHOLECYSTECTOMY  10/1990  . CORONARY ARTERY BYPASS GRAFT  1986   x5 LIMA to LAD,SVG to diagonal,SVG to OM,SVG to PDA and PLA  . CORONARY STENT PLACEMENT    . INGUINAL HERNIA REPAIR  04/25/2012   right- laparoscopic  . PACEMAKER IMPLANT N/A 03/09/2018    Procedure: PACEMAKER IMPLANT;  Surgeon: Evans Lance, MD;  Location: [place] [facility];  Service: Cardiovascular;  Laterality: N/A;  . R/P Myoview  12/27/2008   no ischemia  . TEE WITHOUT CARDIOVERSION N/A 03/09/2018   Procedure: TRANSESOPHAGEAL ECHOCARDIOGRAM (TEE);  Surgeon: Fay Records, MD;  Location[facility];  Service: Cardiovascular;  Laterality: N/A;  Bubble Study  . US ECHOCARDIOGRAPHY  12/27/2008   concentric LVH,mild to mod MR, TR and pulmonary hypertension. EF 45-50%     Current Outpatient Medications  Medication Sig Dispense Refill  . acetaminophen (TYLENOL) 325 MG tablet Take 650 mg by mouth every 6 (six) hours as needed.    [name] apixaban (ELIQUIS) 5 MG TABS tablet Take 1 tablet (5 mg total) by mouth 2 (two) times daily. 180 tablet 1  . atorvastatin (LIPITOR) 40 MG tablet Take 1 tablet daily for Cholesterol 90 tablet 3  . buPROPion (WELLBUTRIN XL) 300 MG 24 hr tablet Take 1 tablet daily for Mood 90 tablet 3  . citalopram (CELEXA) 40 MG tablet Take 1 tablet (40 mg total) by mouth daily. 90 tablet 2  . co-enzyme Q-10 30 MG capsule Take 30 mg by mouth daily.     [name]  diazepam (VALIUM) 5 MG tablet Take 1/2 to 1 tablet 2 to 3 x / day only if needed for Muscle spasms  & please try to limit to 5 days /week to avoid addiction 90 tablet 0  . Digestive Enzymes CAPS Take 1-2 capsules by mouth as needed (to aid digestive function).     . gabapentin (NEURONTIN) 300 MG capsule Take 1 capsule 3 x /day as needed for Chronic Pain 270 capsule 0  . HYDROcodone-acetaminophen (NORCO/VICODIN) 5-325 MG tablet Take 1-2 tablets by mouth every 4 (four) hours as needed. 15 tablet 0  . losartan (COZAAR) 25 MG tablet Take 0.5 tablets (12.5 mg total) by mouth daily. 45 tablet 3  . Magnesium Chloride (MAGNESIUM DR PO) Take 2 tablets by mouth daily.     . metoprolol succinate (TOPROL-XL) 25 MG 24 hr tablet Take 1 tablet Daily for BP & Heart 90 tablet 3  . nitroGLYCERIN (NITROSTAT) 0.4 MG SL tablet Place 1  tablet (0.4 mg total) under the tongue every 5 (five) minutes as needed for chest pain. 25 tablet 3  . furosemide (LASIX) 40 MG tablet Take 1 tablet (40 mg total) by mouth daily. 30 tablet 1  . potassium chloride SA (KLOR-CON) 20 MEQ tablet Take 2 tablets (40 mEq total) by mouth daily. 60 tablet 1   No current facility-administered medications for this visit.    Allergies:   Erythromycin    Social History:  The patient  reports that he quit smoking about 49 years ago. He has never used smokeless tobacco. He reports that he does not drink alcohol and does not use drugs.   Family History:  The patient's family history includes Cancer in his brother and father.    ROS:  Please see the history of present illness.    Otherwise, review of systems positive for none.   All other systems are reviewed and are negative.  PHYSICAL EXAM: VS:  BP (!) 103/64   Pulse 73   Ht 5\' 6"  (1.676 m)   SpO2 95%   BMI 28.76 kg/m  , BMI Body mass index is 28.76 kg/m.  General: Alert, oriented x3, no distress, he has aged.  He looks more frail.  The left subclavian pacemaker site looks healthy. Head: no evidence of trauma, PERRL, EOMI, no exophtalmos or lid lag, no myxedema, no xanthelasma; normal ears, nose and oropharynx Neck: normal jugular venous pulsations and no hepatojugular reflux; brisk carotid pulses without delay and no carotid bruits Chest: clear to auscultation, no signs of consolidation by percussion or palpation, normal fremitus, symmetrical and full respiratory excursions Cardiovascular: normal position and quality of the apical impulse, regular rhythm, normal first and paradoxically split second heart sounds, no murmurs, rubs or gallops Abdomen: no tenderness or distention, no masses by palpation, no abnormal pulsatility or arterial bruits, normal bowel sounds, no hepatosplenomegaly Extremities: no clubbing, cyanosis or edema; 2+ radial, ulnar and brachial pulses bilaterally; 2+ right femoral,  posterior tibial and dorsalis pedis pulses; 2+ left femoral, posterior tibial and dorsalis pedis pulses; no subclavian or femoral bruits Neurological: grossly nonfocal Psych: Normal mood and affect    EKG:  EKG is not ordered today.  Is ordered today and shows atrial ventricular sequential pacing.  The AV delay is prolonged at 224 ms.  The QRS is quite narrow at 126 ms with positive R waves in V1.  The QTC is 471 ms.  Recent Labs: 12/04/2019: Magnesium 1.7; TSH 0.48 01/22/2020: ALT 26; BUN 18; Creatinine, Ser 1.24; Hemoglobin  15.5; Platelets 210; Potassium 3.2; Sodium 138    Lipid Panel    Component Value Date/Time   CHOL 151 12/04/2019 1630   CHOL 117 08/21/2018 0943   TRIG 154 (H) 12/04/2019 1630   HDL 51 12/04/2019 1630   HDL 54 08/21/2018 0943   CHOLHDL 3.0 12/04/2019 1630   VLDL 12 03/07/2018 0459   LDLCALC 74 12/04/2019 1630      Wt Readings from Last 3 Encounters:  01/31/20 178 lb 3.2 oz (80.8 kg)  12/04/19 178 lb 6.4 oz (80.9 kg)  07/23/19 172 lb (78 kg)      ASSESSMENT AND PLAN:  1. Chronic combined systolic and diastolic heart failure ([place])   2. Coronary artery disease of native artery of native heart with stable angina pectoris (HCC)   3. Paroxysmal atrial fibrillation ([place])   4. Hypercholesterolemia   5. Second degree atrioventricular block   6. Complete heart block ([place])   7. History of atrial flutter   8. 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 Essential hypertension   10. Hypokalemia     1. CHF: He has not been able to tolerate higher doses of heart failure medicines due to hypotension.  He is on small doses of metoprolol and losartan.  He has not had heart failure exacerbation in a long time other than the current problems with lower extremity edema.  I think this high dose of hydrochlorothiazide is deleterious to him and some of his falls may be precipitated by hypotension or muscle weakness due to hypokalemia.  We will stop the  hydrochlorothiazide and after we correct his hypokalemia we will start him on a low-dose of furosemide.  He will require early follow-up. 2. CAD s/p CABG: In the past used to develop angina pectoris during cold weather and physical activity, this has not been a problem in the last couple of years.  Most recent imaging studies suggest that he may indeed have completely lost vascular supply to the inferior wall due to occlusion of the SVG to the PDA/PLA.   3. AFib: On Eliquis.  Overall burden of arrhythmia is low, but he had a 30-minute episode as recently as a couple of weeks ago.  I remain very concerned by his falls and we may have to discontinue his anticoagulant.  I be less concerned if he had more support such as in an assisted living facility. 4. HLP: All lipid parameters are very close to target on current statin dose. 5. 2nd/3rd deg AVB: Today he has an underlying rhythm of severe sinus bradycardia with 1: 1 AV conduction, but he has had intermittent complete heart block, especially at higher rates and has virtually 100% ventricular pacing. 6. PPM:  septal His bundle ventricular lead.  Normal device function. Remote downloads every 3 months and yearly office visits. 7.  Occluded right vertebral artery: Chronic, no new recent neurological complaints.  His episodes of falls sound like they are related to poor conditioning and possibly hypotension.  Today do not sound like "drop attacks". 8. HTN: BP has been running lower, especially the DBP 9.  Again made a referral to a Education officer, museum and encouraged him to consider assisted living.  I am very concerned about his ability to safely live independently at this point.     Orders Placed This Encounter  Procedures  . Basic Metabolic Panel (BMET)  . EKG 12-Lead    Patient Instructions  Medication Instructions:  Start Potassium 40MEQ (2 Tablets) today  Start  Lasix (Furosemide) 40mg  Once Daily next Tuesday the 27th,  Stop Hydrochlorothiazide (HCTZ)    *If you need a refill on your cardiac medications before your next appointment, please call your pharmacy*   Lab Work: BMET- August 3rd  If you have labs (blood work) drawn today and your tests are completely normal, you will receive your results only by: [name] MyChart Message (if you have MyChart) OR . A paper copy in the mail If you have any lab test that is abnormal or we need to change your treatment, we will call you to review the results.   Testing/Procedures: None Ordered    Follow-Up: At [facility], you and your health needs are our priority.  As part of our continuing mission to provide you with exceptional heart care, we have created designated Provider Care Teams.  These Care Teams include your primary Cardiologist (physician) and Advanced Practice Providers (APPs -  Physician Assistants and Nurse Practitioners) who all work together to provide you with the care you need, when you need it.  We recommend signing up for the patient portal called "MyChart".  Sign up information is provided on this After Visit Summary.  MyChart is used to connect with patients for Virtual Visits (Telemedicine).  Patients are able to view lab/test results, encounter notes, upcoming appointments, etc.  Non-urgent messages can be sent to your provider as well.   To learn more about what you can do with MyChart, go to [email or web address].    Your next appointment:   You have an appointment September 7th at 4 PM  The format for your next appointment:   In Person  Provider:   Sanda Klein, MD   Other Instructions   Signed, Sanda Klein, MD  02/10/2020 4:35 PM    Sanda Klein, MD, [facility] [phone] office [phone] pager

## 2020-02-11 ENCOUNTER — Encounter: Payer: Self-pay | Admitting: Physical Therapy

## 2020-02-11 ENCOUNTER — Ambulatory Visit (INDEPENDENT_AMBULATORY_CARE_PROVIDER_SITE_OTHER): Payer: Medicare Other | Admitting: Physical Therapy

## 2020-02-11 ENCOUNTER — Telehealth: Payer: Self-pay | Admitting: Licensed Clinical Social Worker

## 2020-02-11 ENCOUNTER — Other Ambulatory Visit: Payer: Self-pay

## 2020-02-11 DIAGNOSIS — R2689 Other abnormalities of gait and mobility: Secondary | ICD-10-CM | POA: Diagnosis not present

## 2020-02-11 DIAGNOSIS — R2681 Unsteadiness on feet: Secondary | ICD-10-CM

## 2020-02-11 DIAGNOSIS — M6281 Muscle weakness (generalized): Secondary | ICD-10-CM

## 2020-02-11 DIAGNOSIS — R293 Abnormal posture: Secondary | ICD-10-CM

## 2020-02-11 DIAGNOSIS — M79605 Pain in left leg: Secondary | ICD-10-CM | POA: Diagnosis not present

## 2020-02-11 NOTE — Therapy (Signed)
[facility] [address] [place], [place], [address] Phone: [phone]   Fax:  [phone]  Physical Therapy Treatment  Patient Details  Name: Paul Frazier. MRN: [id number] Date of Birth: 03/05/1933 Referring Provider (PT): Liane Comber, NP   Encounter Date: 02/11/2020   PT End of Session - 02/11/20 1522    Visit Number 11    Number of Visits 25    Date for PT Re-Evaluation 03/20/20    Authorization Type Medicare A & B and AARP    PT Start Time 1430    PT Stop Time 1515    PT Time Calculation (min) 45 min    Activity Tolerance No increased pain;Patient limited by fatigue    Behavior During Therapy [facility] for tasks assessed/performed           Past Medical History:  Diagnosis Date  . Arthritis   . CAD (coronary artery disease)   . Hyperlipidemia   . Hypertension   . Old myocardial infarct   . Right inguinal hernia 04/19/2012  . S/P CABG x 5 08/29/1984   LIMA to LAD,SVG to diagonal,SVG to OM,SVG to PDA and PLA  . Tremor, essential     Past Surgical History:  Procedure Laterality Date  . A-FLUTTER ABLATION N/A 03/09/2018   Procedure: A-FLUTTER ABLATION;  Surgeon: Evans Lance, MD;  Location: [place] [facility];  Service: Cardiovascular;  Laterality: N/A;  . CHOLECYSTECTOMY  10/1990  . CORONARY ARTERY BYPASS GRAFT  1986   x5 LIMA to LAD,SVG to diagonal,SVG to OM,SVG to PDA and PLA  . CORONARY STENT PLACEMENT    . INGUINAL HERNIA REPAIR  04/25/2012   right- laparoscopic  . PACEMAKER IMPLANT N/A 03/09/2018   Procedure: PACEMAKER IMPLANT;  Surgeon: Evans Lance, MD;  Location: [place] [facility];  Service: Cardiovascular;  Laterality: N/A;  . R/P Myoview  12/27/2008   no ischemia  . TEE WITHOUT CARDIOVERSION N/A 03/09/2018   Procedure: TRANSESOPHAGEAL ECHOCARDIOGRAM (TEE);  Surgeon: Fay Records, MD;  Location: [facility] ENDOSCOPY;  Service: Cardiovascular;  Laterality: N/A;  Bubble Study  . US ECHOCARDIOGRAPHY  12/27/2008    concentric LVH,mild to mod MR, TR and pulmonary hypertension. EF 45-50%    There were no vitals filed for this visit.   Subjective Assessment - 02/11/20 1440    Subjective He relays no falls since he has been using rollator    Pertinent History spinal stenosis/radiculopathy, chronic peripheral neuropathy, CHF, CAD, complete heart block, pacemaker, basal cell CA, atheroscelerosis on Aorta    Patient Stated Goals To improve balance to stop falls.    Pain Onset 1 to 4 weeks ago                             [facility] Adult PT Treatment/Exercise - 02/11/20 0001      Transfers   Transfers Sit to Stand;Stand to Sit;Stand Pivot Transfers    Sit to Stand 5: Supervision    Stand to Sit 6: Modified independent (Device/Increase time)    Stand Pivot Transfers 5: Supervision      Ambulation/Gait   Ambulation/Gait Yes    Ambulation/Gait Assistance 5: Supervision    Ambulation Distance (Feet) 250 Feet    Assistive device Rollator    Gait Pattern Step-through pattern;Decreased arm swing - left;Decreased hip/knee flexion - right;Decreased hip/knee flexion - left;Right flexed knee in stance;Left flexed knee in stance;Shuffle;Antalgic;Lateral hip instability;Trunk flexed  High Level Balance   High Level Balance Comments In bars: balance on airex pad feet together and feet apart with weight shifts A-P, and lateral.  retro walking and sidestepping. Sit to stands no UE support 3 sets of 5 with airex pad and pushing up from quads. Balance at sink reaching fwd and reaching up into cabinent with focus on anterior weight shift. Corner balance with rollator in front performing alt fwd reaches and bilat fwd reaching      Exercises   Other Exercises  heel raises but unable to perform except in very small ROM due to weakness, but he can do seated plantar flexion with green resistance X 20 reps on ea side                  PT Education - 02/11/20 1522    Education Details continue to use  rollator to reduce risk of falling add in anterior weight shifting with UE fwd reaching in corner at home for balance.    Person(s) Educated Patient    Methods Explanation    Comprehension Verbalized understanding            PT Short Term Goals - 02/06/20 1554      PT SHORT TERM GOAL #1   Title Patient verbalizes & demonstrates understanding on updated HEP.  (All STGs Target Date: 02/29/2020)    Baseline NOT MET 02/04/2020    Time 1    Period Months    Status Revised    Target Date 02/29/20      PT SHORT TERM GOAL #2   Title Patient ambulates with rollator walker negotiating obstacles with supervision.    Time 1    Period Months    Status Revised    Target Date 02/29/20      PT SHORT TERM GOAL #3   Title Patient able to pick up items from floor and reaches 10" with locked rollator walker support with supervision.    Time 1    Period Months    Status Revised    Target Date 02/29/20      PT SHORT TERM GOAL #4   Title Patient reports >20% improvement in pain with activiites.    Time 1    Period Months    Status On-going    Target Date 02/29/20      PT SHORT TERM GOAL #5   Title Patient negotiates ramps & curbs with rollator walker with supervision.    Time 4    Period Weeks    Status New    Target Date 02/29/20             PT Long Term Goals - 02/04/20 2115      PT LONG TERM GOAL #1   Title Patient verbalizes & demonstrates understanding of ongoing HEP / fitness plan.  (All LTGs Target Date: 03/21/2020)    Time 12    Period Weeks    Status On-going    Target Date 03/21/20      PT LONG TERM GOAL #2   Title Berg Balance Tasks > 45/56 with light touch on walker    Time 12    Period Weeks    Status Revised    Target Date 03/21/20      PT LONG TERM GOAL #3   Title Functional Gait Assessment >/= 19/30 with rollator walker indicating lower fall risk.    Time 12    Period Weeks    Status Revised  Target Date 03/21/20      PT LONG TERM GOAL #4   Title  Patient verbalizes & demonstrates understanding how to decrease leg pain to improve standing & gait tolerance.    Time 12    Period Weeks    Status On-going    Target Date 03/21/20      PT LONG TERM GOAL #5   Title Patient ambulates 300' & negotiates ramps/curbs with rollator walker modified independent.    Time 12    Period Weeks    Status Revised    Target Date 03/21/20                 Plan - 02/11/20 1523    Clinical Impression Statement Focused more on balance with fwd UE reaching in efforts to try to incorporate anterior weight shifting and ankle strategy but he continues to lack this and uses mostly only hip strategy for balance. PT will continue to try to improve overall balance and leg strength to reduce his risk of falling.    Personal Factors and Comorbidities Age;Comorbidity 3+;Fitness;Past/Current Experience    Comorbidities spinal stenosis/radiculopathy, chronic peripheral neuropathy, CHF, CAD, complete heart block, pacemaker, basal cell CA, atheroscelerosis on Aorta,    Examination-Activity Limitations Locomotion Level;Stairs;Stand;Transfers    Examination-Participation Restrictions Community Activity    Stability/Clinical Decision Making Stable/Uncomplicated    Rehab Potential Good    PT Frequency 2x / week    PT Duration 12 weeks    PT Treatment/Interventions ADLs/Self Care Home Management;Cryotherapy;Electrical Stimulation;Moist Heat;Ultrasound;DME Instruction;Gait training;Stair training;Functional mobility training;Therapeutic activities;Therapeutic exercise;Balance training;Neuromuscular re-education;Patient/family education;Manual techniques;Passive range of motion;Dry needling;Vestibular    PT Next Visit Plan review rollator walker use, therapuetic exercise & balance activities    PT Home Exercise Plan Access Code: [email or web address]    Consulted and Agree with Plan of Care Patient           Patient will benefit from skilled therapeutic intervention in order to  improve the following deficits and impairments:  Abnormal gait, Decreased activity tolerance, Cardiopulmonary status limiting activity, Decreased balance, Decreased coordination, Decreased endurance, Decreased knowledge of use of DME, Decreased mobility, Decreased range of motion, Decreased strength, Impaired flexibility, Postural dysfunction, Pain  Visit Diagnosis: Other abnormalities of gait and mobility  Unsteadiness on feet  Abnormal posture  Muscle weakness (generalized)  Pain in left leg     Problem List Patient Active Problem List   Diagnosis Date Noted  . At risk for falls 12/04/2019  . Unsteady gait 12/04/2019  . Acquired thrombophilia ([place]) 07/23/2019  . Chronic combined systolic and diastolic heart failure ([place]) 08/29/2018  . Pacemaker 08/29/2018  . History of transient ischemic attack (TIA) 08/29/2018  . Lumbar pain 06/09/2018  . Senile purpura ([employer]) 05/11/2018  . Complete heart block ([place])   . Paroxysmal atrial flutter ([place]) 03/06/2018  . Atherosclerosis of aorta ([place]) 08/23/2016  . History of basal cell cancer 08/01/2015  . Major depression in full remission ([place]) 09/11/2014  . Essential hypertension 01/01/2014  . Vitamin D deficiency 01/01/2014  . Medication management 01/01/2014  . Other abnormal glucose 01/01/2014  . Nonsustained ventricular tachycardia ([place]) 05/23/2013  . CAD (coronary artery disease) 01/08/2013  . Cardiomyopathy, ischemic 01/08/2013  . Hyperlipidemia 01/08/2013  . LBBB (left bundle branch block) 01/08/2013    Debbe Odea, PT,DPT 02/11/2020, 3:25 PM  [facility] Physical Therapy [address] [place], [place], [address] Phone: [phone]   Fax:  [phone]  Name: Paul Frazier. MRN: [id number] Date of Birth: 15-Mar-1933

## 2020-02-11 NOTE — Progress Notes (Signed)
Heart and Vascular Care Navigation  02/11/2020  Paul Frazier. 09-Mar-1933 [id number]  Reason for Referral:  CSW received consult that pt needing home assistance- potentially PT, RN, and medication management.                                                                                                    Assessment:     CSW called pt to discuss concerns.  Pt reports he has lived alone since his spouse had to go to a nursing home.  Has a son and a son in law that live nearby and visit about once a week.  Pt reports he is able to physically do what he needs at home.  States he can bath himself and cooks for himself when he is feeling up to it.  States he manages his own medications and takes them straight out of the bottle.  Is already enrolled with outpatient PT which he attends twice a week.  Still drives when he feels like it and goes to the store himself.       CSW made referral to [facility] chronic care program after discussing with pt.  [facility] can complete home eval and assist with care concerns and help connect with further home health resources if needed.  HRT/VAS Care Coordination    Patients Home Cardiology Office [place] arrangements for the past 2 months Single Family Home   Lives with: Self   Patient Current Insurance Coverage Managed Medicare   Patient Has Concern With Paying Medical Bills No   Does Patient Have Prescription Coverage? Yes   Home Assistive Devices/Equipment Cane (specify quad or straight); Blood pressure cuff   DME Agency NA   [facility]      Social History:                                                                             SDOH Screenings   Alcohol Screen:   . Last Alcohol Screening Score (AUDIT):   Depression (PHQ2-9): Low Risk   . PHQ-2 Score: 1  Financial Resource Strain:   . Difficulty of Paying Living Expenses:   Food Insecurity:   . Worried About Charity fundraiser in the Last  Year:   . [place] in the Last Year:   Housing:   . Last Housing Risk Score:   Physical Activity:   . Days of Exercise per Week:   . Minutes of Exercise per Session:   Social Connections:   . Frequency of Communication with Friends and Family:   . Frequency of Social Gatherings with Friends and Family:   . Attends Religious Services:   . Active Member of Clubs or Organizations:   . Attends Club or  Organization Meetings:   [name] Marital Status:   Stress:   . Feeling of Stress :   Tobacco Use: Medium Risk  . Smoking Tobacco Use: Former Smoker  . Smokeless Tobacco Use: Never Used  Transportation Needs:   . Film/video editor (Medical):   [name] Lack of Transportation (Non-Medical):      Follow-up plan:  Referral made to [facility]- they will reach out to pt directly to schedule home visit.  Jorge Ny, LCSW Clinical Social Worker [facility] Desk#: [phone] Cell#: [phone]

## 2020-02-13 ENCOUNTER — Ambulatory Visit (INDEPENDENT_AMBULATORY_CARE_PROVIDER_SITE_OTHER): Payer: Medicare Other | Admitting: Physical Therapy

## 2020-02-13 ENCOUNTER — Other Ambulatory Visit: Payer: Self-pay

## 2020-02-13 DIAGNOSIS — M79604 Pain in right leg: Secondary | ICD-10-CM

## 2020-02-13 DIAGNOSIS — M79605 Pain in left leg: Secondary | ICD-10-CM

## 2020-02-13 DIAGNOSIS — R293 Abnormal posture: Secondary | ICD-10-CM

## 2020-02-13 DIAGNOSIS — R2689 Other abnormalities of gait and mobility: Secondary | ICD-10-CM

## 2020-02-13 DIAGNOSIS — M6281 Muscle weakness (generalized): Secondary | ICD-10-CM

## 2020-02-13 DIAGNOSIS — R2681 Unsteadiness on feet: Secondary | ICD-10-CM | POA: Diagnosis not present

## 2020-02-13 DIAGNOSIS — R296 Repeated falls: Secondary | ICD-10-CM | POA: Diagnosis not present

## 2020-02-13 NOTE — Therapy (Signed)
[facility] [address] [place], [place], [address] Phone: [phone]   Fax:  [phone]  Physical Therapy Treatment  Patient Details  Name: Paul Frazier. MRN: [id number] Date of Birth: 1933/05/28 Referring Provider (PT): Liane Comber, NP   Encounter Date: 02/13/2020   PT End of Session - 02/13/20 1504    Visit Number 12    Number of Visits 25    Date for PT Re-Evaluation 03/20/20    Authorization Type Medicare A & B and AARP    PT Start Time 1430    PT Stop Time 1510    PT Time Calculation (min) 40 min    Activity Tolerance No increased pain;Patient limited by fatigue    Behavior During Therapy [facility] for tasks assessed/performed           Past Medical History:  Diagnosis Date  . Arthritis   . CAD (coronary artery disease)   . Hyperlipidemia   . Hypertension   . Old myocardial infarct   . Right inguinal hernia 04/19/2012  . S/P CABG x 5 08/29/1984   LIMA to LAD,SVG to diagonal,SVG to OM,SVG to PDA and PLA  . Tremor, essential     Past Surgical History:  Procedure Laterality Date  . A-FLUTTER ABLATION N/A 03/09/2018   Procedure: A-FLUTTER ABLATION;  Surgeon: Evans Lance, MD;  Location: [place] [facility];  Service: Cardiovascular;  Laterality: N/A;  . CHOLECYSTECTOMY  10/1990  . CORONARY ARTERY BYPASS GRAFT  1986   x5 LIMA to LAD,SVG to diagonal,SVG to OM,SVG to PDA and PLA  . CORONARY STENT PLACEMENT    . INGUINAL HERNIA REPAIR  04/25/2012   right- laparoscopic  . PACEMAKER IMPLANT N/A 03/09/2018   Procedure: PACEMAKER IMPLANT;  Surgeon: Evans Lance, MD;  Location: [place] [facility];  Service: Cardiovascular;  Laterality: N/A;  . R/P Myoview  12/27/2008   no ischemia  . TEE WITHOUT CARDIOVERSION N/A 03/09/2018   Procedure: TRANSESOPHAGEAL ECHOCARDIOGRAM (TEE);  Surgeon: Fay Records, MD;  Location: [facility] ENDOSCOPY;  Service: Cardiovascular;  Laterality: N/A;  Bubble Study  . US ECHOCARDIOGRAPHY  12/27/2008    concentric LVH,mild to mod MR, TR and pulmonary hypertension. EF 45-50%    There were no vitals filed for this visit.   Subjective Assessment - 02/13/20 1452    Subjective He relays no falls since he has been using rollator. He does say he is not having a good day today due to leg weakness and 7/10 leg pain    Pertinent History spinal stenosis/radiculopathy, chronic peripheral neuropathy, CHF, CAD, complete heart block, pacemaker, basal cell CA, atheroscelerosis on Aorta    Patient Stated Goals To improve balance to stop falls.    Pain Onset 1 to 4 weeks ago             [facility] Adult PT Treatment/Exercise - 02/13/20 0001      Therapeutic Activites    Other Therapeutic Activities ambulation with RW 150 ft, working on turns, negoitiating obstacles, and picking up objects off floor(with cues and demo to lock rollator first before attempting to bend over so he can grab on if needed). Sit to stands with Rollator 3X5 reps.      Neuro Re-ed    Neuro Re-ed Details  balance in bars: fwd walking, sidestepping and retro walking no UE support.  Corner balance focusing on anterior weight shifting and forward reaching      Exercises   Other Exercises  Bike 10 min  L1, seated ankle PF with green band 2x20.                    PT Short Term Goals - 02/06/20 1554      PT SHORT TERM GOAL #1   Title Patient verbalizes & demonstrates understanding on updated HEP.  (All STGs Target Date: 02/29/2020)    Baseline NOT MET 02/04/2020    Time 1    Period Months    Status Revised    Target Date 02/29/20      PT SHORT TERM GOAL #2   Title Patient ambulates with rollator walker negotiating obstacles with supervision.    Time 1    Period Months    Status Revised    Target Date 02/29/20      PT SHORT TERM GOAL #3   Title Patient able to pick up items from floor and reaches 10" with locked rollator walker support with supervision.    Time 1    Period Months    Status Revised    Target Date  02/29/20      PT SHORT TERM GOAL #4   Title Patient reports >20% improvement in pain with activiites.    Time 1    Period Months    Status On-going    Target Date 02/29/20      PT SHORT TERM GOAL #5   Title Patient negotiates ramps & curbs with rollator walker with supervision.    Time 4    Period Weeks    Status New    Target Date 02/29/20             PT Long Term Goals - 02/04/20 2115      PT LONG TERM GOAL #1   Title Patient verbalizes & demonstrates understanding of ongoing HEP / fitness plan.  (All LTGs Target Date: 03/21/2020)    Time 12    Period Weeks    Status On-going    Target Date 03/21/20      PT LONG TERM GOAL #2   Title Berg Balance Tasks > 45/56 with light touch on walker    Time 12    Period Weeks    Status Revised    Target Date 03/21/20      PT LONG TERM GOAL #3   Title Functional Gait Assessment >/= 19/30 with rollator walker indicating lower fall risk.    Time 12    Period Weeks    Status Revised    Target Date 03/21/20      PT LONG TERM GOAL #4   Title Patient verbalizes & demonstrates understanding how to decrease leg pain to improve standing & gait tolerance.    Time 12    Period Weeks    Status On-going    Target Date 03/21/20      PT LONG TERM GOAL #5   Title Patient ambulates 300' & negotiates ramps/curbs with rollator walker modified independent.    Time 12    Period Weeks    Status Revised    Target Date 03/21/20                 Plan - 02/13/20 1505    Clinical Impression Statement He did meet one short term goal today which was demonstrating safe technique to pick up object from floor using locked rollator for UE assistance as needed. Continued with LE strength and balance work but he continues to lose balance posteriorly and lacks anterior weight shift.  Personal Factors and Comorbidities Age;Comorbidity 3+;Fitness;Past/Current Experience    Comorbidities spinal stenosis/radiculopathy, chronic peripheral neuropathy,  CHF, CAD, complete heart block, pacemaker, basal cell CA, atheroscelerosis on Aorta,    Examination-Activity Limitations Locomotion Level;Stairs;Stand;Transfers    Examination-Participation Restrictions Community Activity    Stability/Clinical Decision Making Stable/Uncomplicated    Rehab Potential Good    PT Frequency 2x / week    PT Duration 12 weeks    PT Treatment/Interventions ADLs/Self Care Home Management;Cryotherapy;Electrical Stimulation;Moist Heat;Ultrasound;DME Instruction;Gait training;Stair training;Functional mobility training;Therapeutic activities;Therapeutic exercise;Balance training;Neuromuscular re-education;Patient/family education;Manual techniques;Passive range of motion;Dry needling;Vestibular    PT Next Visit Plan balance trying to facilitate anterior weight shift, leg strength, safe use of rollator.    PT Home Exercise Plan Access Code: [email or web address]    Consulted and Agree with Plan of Care Patient           Patient will benefit from skilled therapeutic intervention in order to improve the following deficits and impairments:  Abnormal gait, Decreased activity tolerance, Cardiopulmonary status limiting activity, Decreased balance, Decreased coordination, Decreased endurance, Decreased knowledge of use of DME, Decreased mobility, Decreased range of motion, Decreased strength, Impaired flexibility, Postural dysfunction, Pain  Visit Diagnosis: Other abnormalities of gait and mobility  Unsteadiness on feet  Abnormal posture  Muscle weakness (generalized)  Pain in left leg  Pain in right leg  Repeated falls     Problem List Patient Active Problem List   Diagnosis Date Noted  . At risk for falls 12/04/2019  . Unsteady gait 12/04/2019  . Acquired thrombophilia ([place]) 07/23/2019  . Chronic combined systolic and diastolic heart failure ([place]) 08/29/2018  . Pacemaker 08/29/2018  . History of transient ischemic attack (TIA) 08/29/2018  . Lumbar pain 06/09/2018  .  Senile purpura ([place]) 05/11/2018  . Complete heart block ([place])   . Paroxysmal atrial flutter ([place]) 03/06/2018  . Atherosclerosis of aorta ([place]) 08/23/2016  . History of basal cell cancer 08/01/2015  . Major depression in full remission ([place]) 09/11/2014  . Essential hypertension 01/01/2014  . Vitamin D deficiency 01/01/2014  . Medication management 01/01/2014  . Other abnormal glucose 01/01/2014  . Nonsustained ventricular tachycardia ([place]) 05/23/2013  . CAD (coronary artery disease) 01/08/2013  . Cardiomyopathy, ischemic 01/08/2013  . Hyperlipidemia 01/08/2013  . LBBB (left bundle branch block) 01/08/2013    Debbe Odea, PT,DPT 02/13/2020, 3:14 PM  [facility] Physical Therapy [address] [place], [place], [address] Phone: [phone]   Fax:  [phone]  Name: Paul Frazier. MRN: [id number] Date of Birth: [birth year]

## 2020-02-15 DIAGNOSIS — T22131S Burn of first degree of right upper arm, sequela: Secondary | ICD-10-CM | POA: Diagnosis not present

## 2020-02-18 ENCOUNTER — Ambulatory Visit (INDEPENDENT_AMBULATORY_CARE_PROVIDER_SITE_OTHER): Payer: Medicare Other | Admitting: Physical Therapy

## 2020-02-18 ENCOUNTER — Other Ambulatory Visit: Payer: Self-pay

## 2020-02-18 DIAGNOSIS — R293 Abnormal posture: Secondary | ICD-10-CM | POA: Diagnosis not present

## 2020-02-18 DIAGNOSIS — M6281 Muscle weakness (generalized): Secondary | ICD-10-CM | POA: Diagnosis not present

## 2020-02-18 DIAGNOSIS — R2689 Other abnormalities of gait and mobility: Secondary | ICD-10-CM | POA: Diagnosis not present

## 2020-02-18 DIAGNOSIS — R2681 Unsteadiness on feet: Secondary | ICD-10-CM | POA: Diagnosis not present

## 2020-02-18 DIAGNOSIS — R296 Repeated falls: Secondary | ICD-10-CM | POA: Diagnosis not present

## 2020-02-18 NOTE — Therapy (Signed)
[facility] Physical Therapy [address] [place], [place], [address] Phone: [phone]   Fax:  [phone]  Physical Therapy Treatment  Patient Details  Name: Paul Frazier. MRN: [id number] Date of Birth: [birth year] Referring Provider (PT): Liane Comber, NP   Encounter Date: 02/18/2020   PT End of Session - 02/18/20 1527    Visit Number 13    Number of Visits 25    Date for PT Re-Evaluation 03/20/20    Authorization Type Medicare A & B and AARP    PT Start Time 7530    PT Stop Time 1512    PT Time Calculation (min) 40 min    Activity Tolerance No increased pain;Patient limited by fatigue    Behavior During Therapy [facility] for tasks assessed/performed           Past Medical History:  Diagnosis Date  . Arthritis   . CAD (coronary artery disease)   . Hyperlipidemia   . Hypertension   . Old myocardial infarct   . Right inguinal hernia 04/19/2012  . S/P CABG x 5 08/29/1984   LIMA to LAD,SVG to diagonal,SVG to OM,SVG to PDA and PLA  . Tremor, essential     Past Surgical History:  Procedure Laterality Date  . A-FLUTTER ABLATION N/A 03/09/2018   Procedure: A-FLUTTER ABLATION;  Surgeon: Evans Lance, MD;  Location: [place] [facility];  Service: Cardiovascular;  Laterality: N/A;  . CHOLECYSTECTOMY  10/1990  . CORONARY ARTERY BYPASS GRAFT  1986   x5 LIMA to LAD,SVG to diagonal,SVG to OM,SVG to PDA and PLA  . CORONARY STENT PLACEMENT    . INGUINAL HERNIA REPAIR  04/25/2012   right- laparoscopic  . PACEMAKER IMPLANT N/A 03/09/2018   Procedure: PACEMAKER IMPLANT;  Surgeon: Evans Lance, MD;  Location: [place] [facility];  Service: Cardiovascular;  Laterality: N/A;  . R/P Myoview  12/27/2008   no ischemia  . TEE WITHOUT CARDIOVERSION N/A 03/09/2018   Procedure: TRANSESOPHAGEAL ECHOCARDIOGRAM (TEE);  Surgeon: Fay Records, MD;  Location: [facility] ENDOSCOPY;  Service: Cardiovascular;  Laterality: N/A;  Bubble Study  . US ECHOCARDIOGRAPHY  12/27/2008    concentric LVH,mild to mod MR, TR and pulmonary hypertension. EF 45-50%    There were no vitals filed for this visit.   Subjective Assessment - 02/18/20 1524    Subjective relays no new complaints today, relays compliance with rollator without any new falls    Pertinent History spinal stenosis/radiculopathy, chronic peripheral neuropathy, CHF, CAD, complete heart block, pacemaker, basal cell CA, atheroscelerosis on Aorta    Patient Stated Goals To improve balance to stop falls.    Pain Onset 1 to 4 weeks ago              [facility] PT Assessment - 02/18/20 0001      Standardized Balance Assessment   Standardized Balance Assessment Five Times Sit to Stand    Five times sit to stand comments  13      Berg Balance Test   Sit to Stand Able to stand  independently using hands    Standing Unsupported Able to stand 2 minutes with supervision    Sitting with Back Unsupported but Feet Supported on Floor or Stool Able to sit safely and securely 2 minutes    Stand to Sit Controls descent by using hands    Transfers Able to transfer safely, minor use of hands    Standing Unsupported with Eyes Closed Able to stand 3 seconds  Standing Unsupported with Feet Together Needs help to attain position but able to stand for 30 seconds with feet together    From Standing, Reach Forward with Outstretched Arm Can reach forward >5 cm safely (2")    From Standing Position, Pick up Object from Floor Able to pick up shoe, needs supervision    From Standing Position, Turn to Look Behind Over each Shoulder Turn sideways only but maintains balance    Turn 360 Degrees Needs close supervision or verbal cueing    Standing Unsupported, Alternately Place Feet on Step/Stool Able to complete >2 steps/needs minimal assist    Standing Unsupported, One Foot in Front Needs help to step but can hold 15 seconds    Standing on One Leg Unable to try or needs assist to prevent fall    Total Score 30      Timed Up and Go Test    TUG Normal TUG    Normal TUG (seconds) 11.6    TUG Comments with rollator                         [facility] Adult PT Treatment/Exercise - 02/18/20 0001      Exercises   Other Exercises  Nu step 8 min L6, seated andkle PF with green band X 20 reps bilat, ankle rockerboard 60 reps A-P, step ups on 6 inch step with one UE support X 10 reps bilat                    PT Short Term Goals - 02/06/20 1554      PT SHORT TERM GOAL #1   Title Patient verbalizes & demonstrates understanding on updated HEP.  (All STGs Target Date: 02/29/2020)    Baseline NOT MET 02/04/2020    Time 1    Period Months    Status Revised    Target Date 02/29/20      PT SHORT TERM GOAL #2   Title Patient ambulates with rollator walker negotiating obstacles with supervision.    Time 1    Period Months    Status Revised    Target Date 02/29/20      PT SHORT TERM GOAL #3   Title Patient able to pick up items from floor and reaches 10" with locked rollator walker support with supervision.    Time 1    Period Months    Status Revised    Target Date 02/29/20      PT SHORT TERM GOAL #4   Title Patient reports >20% improvement in pain with activiites.    Time 1    Period Months    Status On-going    Target Date 02/29/20      PT SHORT TERM GOAL #5   Title Patient negotiates ramps & curbs with rollator walker with supervision.    Time 4    Period Weeks    Status New    Target Date 02/29/20             PT Long Term Goals - 02/04/20 2115      PT LONG TERM GOAL #1   Title Patient verbalizes & demonstrates understanding of ongoing HEP / fitness plan.  (All LTGs Target Date: 03/21/2020)    Time 12    Period Weeks    Status On-going    Target Date 03/21/20      PT LONG TERM GOAL #2   Title Berg Balance Tasks > 45/56 with  light touch on walker    Time 12    Period Weeks    Status Revised    Target Date 03/21/20      PT LONG TERM GOAL #3   Title Functional Gait Assessment >/= 19/30  with rollator walker indicating lower fall risk.    Time 12    Period Weeks    Status Revised    Target Date 03/21/20      PT LONG TERM GOAL #4   Title Patient verbalizes & demonstrates understanding how to decrease leg pain to improve standing & gait tolerance.    Time 12    Period Weeks    Status On-going    Target Date 03/21/20      PT LONG TERM GOAL #5   Title Patient ambulates 300' & negotiates ramps/curbs with rollator walker modified independent.    Time 12    Period Weeks    Status Revised    Target Date 03/21/20                 Plan - 02/18/20 1529    Clinical Impression Statement Updated measurements today show improvments in BERG balance score and 5TSTS scores however he does stilll have balance deficits and is a risk for falls. Recommended he keep using rollator due to his poor balance and lack of anterior ankle strategy. Continue POC    Personal Factors and Comorbidities Age;Comorbidity 3+;Fitness;Past/Current Experience    Comorbidities spinal stenosis/radiculopathy, chronic peripheral neuropathy, CHF, CAD, complete heart block, pacemaker, basal cell CA, atheroscelerosis on Aorta,    Examination-Activity Limitations Locomotion Level;Stairs;Stand;Transfers    Examination-Participation Restrictions Community Activity    Stability/Clinical Decision Making Stable/Uncomplicated    Rehab Potential Good    PT Frequency 2x / week    PT Duration 12 weeks    PT Treatment/Interventions ADLs/Self Care Home Management;Cryotherapy;Electrical Stimulation;Moist Heat;Ultrasound;DME Instruction;Gait training;Stair training;Functional mobility training;Therapeutic activities;Therapeutic exercise;Balance training;Neuromuscular re-education;Patient/family education;Manual techniques;Passive range of motion;Dry needling;Vestibular    PT Next Visit Plan balance trying to facilitate anterior weight shift, leg strength, safe use of rollator.    PT Home Exercise Plan Access Code:  [email or web address]    Consulted and Agree with Plan of Care Patient           Patient will benefit from skilled therapeutic intervention in order to improve the following deficits and impairments:  Abnormal gait, Decreased activity tolerance, Cardiopulmonary status limiting activity, Decreased balance, Decreased coordination, Decreased endurance, Decreased knowledge of use of DME, Decreased mobility, Decreased range of motion, Decreased strength, Impaired flexibility, Postural dysfunction, Pain  Visit Diagnosis: Other abnormalities of gait and mobility  Unsteadiness on feet  Abnormal posture  Muscle weakness (generalized)  Repeated falls     Problem List Patient Active Problem List   Diagnosis Date Noted  . At risk for falls 12/04/2019  . Unsteady gait 12/04/2019  . Acquired thrombophilia ([place]) 07/23/2019  . Chronic combined systolic and diastolic heart failure ([place]) 08/29/2018  . Pacemaker 08/29/2018  . History of transient ischemic attack (TIA) 08/29/2018  . Lumbar pain 06/09/2018  . Senile purpura ([place]) 05/11/2018  . Complete heart block ([place])   . Paroxysmal atrial flutter ([place]) 03/06/2018  . Atherosclerosis of aorta ([place]) 08/23/2016  . History of basal cell cancer 08/01/2015  . Major depression in full remission ([place]) 09/11/2014  . Essential hypertension 01/01/2014  . Vitamin D deficiency 01/01/2014  . Medication management 01/01/2014  . Other abnormal glucose 01/01/2014  . Nonsustained ventricular tachycardia ([place]) 05/23/2013  . CAD (coronary  artery disease) 01/08/2013  . Cardiomyopathy, ischemic 01/08/2013  . Hyperlipidemia 01/08/2013  . LBBB (left bundle branch block) 01/08/2013    Silvestre Mesi 02/18/2020, 3:31 PM  [facility] Physical Therapy [address] [place], [place], [address] Phone: [phone]   Fax:  [phone]  Name: Farid Grigorian. MRN: [id number] Date of Birth: Jan 29, 1933

## 2020-02-20 ENCOUNTER — Other Ambulatory Visit: Payer: Self-pay

## 2020-02-20 ENCOUNTER — Other Ambulatory Visit: Payer: Self-pay | Admitting: Internal Medicine

## 2020-02-20 ENCOUNTER — Ambulatory Visit (INDEPENDENT_AMBULATORY_CARE_PROVIDER_SITE_OTHER): Payer: Medicare Other | Admitting: Physical Therapy

## 2020-02-20 ENCOUNTER — Encounter: Payer: Self-pay | Admitting: Physical Therapy

## 2020-02-20 DIAGNOSIS — M6281 Muscle weakness (generalized): Secondary | ICD-10-CM

## 2020-02-20 DIAGNOSIS — R2681 Unsteadiness on feet: Secondary | ICD-10-CM

## 2020-02-20 DIAGNOSIS — M79604 Pain in right leg: Secondary | ICD-10-CM

## 2020-02-20 DIAGNOSIS — M79605 Pain in left leg: Secondary | ICD-10-CM

## 2020-02-20 DIAGNOSIS — R293 Abnormal posture: Secondary | ICD-10-CM

## 2020-02-20 DIAGNOSIS — R531 Weakness: Secondary | ICD-10-CM | POA: Diagnosis not present

## 2020-02-20 DIAGNOSIS — R2689 Other abnormalities of gait and mobility: Secondary | ICD-10-CM | POA: Diagnosis not present

## 2020-02-20 DIAGNOSIS — R296 Repeated falls: Secondary | ICD-10-CM

## 2020-02-20 NOTE — Therapy (Signed)
[facility] Physical Therapy [address] [place], [place], [address] Phone: [phone]   Fax:  [phone]  Physical Therapy Treatment  Patient Details  Name: Paul Frazier. MRN: [id number] Date of Birth: 12-Apr-1933 Referring Provider (PT): Liane Comber, NP   Encounter Date: 02/20/2020   PT End of Session - 02/20/20 1511    Visit Number 14    Number of Visits 25    Date for PT Re-Evaluation 03/20/20    Authorization Type Medicare A & B and AARP    PT Start Time 1430    PT Stop Time 1512    PT Time Calculation (min) 42 min    Activity Tolerance No increased pain;Patient limited by fatigue    Behavior During Therapy [facility] for tasks assessed/performed           Past Medical History:  Diagnosis Date  . Arthritis   . CAD (coronary artery disease)   . Hyperlipidemia   . Hypertension   . Old myocardial infarct   . Right inguinal hernia 04/19/2012  . S/P CABG x 5 08/29/1984   LIMA to LAD,SVG to diagonal,SVG to OM,SVG to PDA and PLA  . Tremor, essential     Past Surgical History:  Procedure Laterality Date  . A-FLUTTER ABLATION N/A 03/09/2018   Procedure: A-FLUTTER ABLATION;  Surgeon: Evans Lance, MD;  Location: [place] [facility];  Service: Cardiovascular;  Laterality: N/A;  . CHOLECYSTECTOMY  10/1990  . CORONARY ARTERY BYPASS GRAFT  1986   x5 LIMA to LAD,SVG to diagonal,SVG to OM,SVG to PDA and PLA  . CORONARY STENT PLACEMENT    . INGUINAL HERNIA REPAIR  04/25/2012   right- laparoscopic  . PACEMAKER IMPLANT N/A 03/09/2018   Procedure: PACEMAKER IMPLANT;  Surgeon: Evans Lance, MD;  Location: [place] [facility];  Service: Cardiovascular;  Laterality: N/A;  . R/P Myoview  12/27/2008   no ischemia  . TEE WITHOUT CARDIOVERSION N/A 03/09/2018   Procedure: TRANSESOPHAGEAL ECHOCARDIOGRAM (TEE);  Surgeon: Fay Records, MD;  Location: [facility] ENDOSCOPY;  Service: Cardiovascular;  Laterality: N/A;  Bubble Study  . US ECHOCARDIOGRAPHY  12/27/2008    concentric LVH,mild to mod MR, TR and pulmonary hypertension. EF 45-50%    There were no vitals filed for this visit.   Subjective Assessment - 02/20/20 1452    Subjective denies any pain or falls, still feels off balance    Pertinent History spinal stenosis/radiculopathy, chronic peripheral neuropathy, CHF, CAD, complete heart block, pacemaker, basal cell CA, atheroscelerosis on Aorta    Patient Stated Goals To improve balance to stop falls.    Pain Onset 1 to 4 weeks ago              [facility] Adult PT Treatment/Exercise - 02/20/20 0001      Therapeutic Activites    Other Therapeutic Activities ambulation with RW 200 ft, working on turns      Neuro Re-ed    Neuro Re-ed Details  balance: with UE support on rocker board 1 min lateral and 2 min A-P.  Standing on decline for anterior weight shift with balance 1 min X 2 reps without UE support. moved to bars: fwd walking without UE support then toe walking with UE suport with cues to not bend knees and try to keep weight anteriorly.  Corner balance focusing on anterior weight shifting and forward reaching      Exercises   Other Exercises  Nu step L5 X 9 min, seated ankle PF with  green band 2x20. Standing gastro stretch 30 sec  X3                    PT Short Term Goals - 02/06/20 1554      PT SHORT TERM GOAL #1   Title Patient verbalizes & demonstrates understanding on updated HEP.  (All STGs Target Date: 02/29/2020)    Baseline NOT MET 02/04/2020    Time 1    Period Months    Status Revised    Target Date 02/29/20      PT SHORT TERM GOAL #2   Title Patient ambulates with rollator walker negotiating obstacles with supervision.    Time 1    Period Months    Status Revised    Target Date 02/29/20      PT SHORT TERM GOAL #3   Title Patient able to pick up items from floor and reaches 10" with locked rollator walker support with supervision.    Time 1    Period Months    Status Revised    Target Date 02/29/20      PT  SHORT TERM GOAL #4   Title Patient reports >20% improvement in pain with activiites.    Time 1    Period Months    Status On-going    Target Date 02/29/20      PT SHORT TERM GOAL #5   Title Patient negotiates ramps & curbs with rollator walker with supervision.    Time 4    Period Weeks    Status New    Target Date 02/29/20             PT Long Term Goals - 02/04/20 2115      PT LONG TERM GOAL #1   Title Patient verbalizes & demonstrates understanding of ongoing HEP / fitness plan.  (All LTGs Target Date: 03/21/2020)    Time 12    Period Weeks    Status On-going    Target Date 03/21/20      PT LONG TERM GOAL #2   Title Berg Balance Tasks > 45/56 with light touch on walker    Time 12    Period Weeks    Status Revised    Target Date 03/21/20      PT LONG TERM GOAL #3   Title Functional Gait Assessment >/= 19/30 with rollator walker indicating lower fall risk.    Time 12    Period Weeks    Status Revised    Target Date 03/21/20      PT LONG TERM GOAL #4   Title Patient verbalizes & demonstrates understanding how to decrease leg pain to improve standing & gait tolerance.    Time 12    Period Weeks    Status On-going    Target Date 03/21/20      PT LONG TERM GOAL #5   Title Patient ambulates 300' & negotiates ramps/curbs with rollator walker modified independent.    Time 12    Period Weeks    Status Revised    Target Date 03/21/20                 Plan - 02/20/20 1512    Clinical Impression Statement Showed some improvments in anterior weight shifting with balance activities however continues to struggle with this and uses more knee/hip strategy instead of ankle strategey for anterior weight shifing. PT will continue to try to improve this.    Personal Factors and Comorbidities Age;Comorbidity 3+;Fitness;Past/Current  Experience    Comorbidities spinal stenosis/radiculopathy, chronic peripheral neuropathy, CHF, CAD, complete heart block, pacemaker, basal cell  CA, atheroscelerosis on Aorta,    Examination-Activity Limitations Locomotion Level;Stairs;Stand;Transfers    Examination-Participation Restrictions Community Activity    Stability/Clinical Decision Making Stable/Uncomplicated    Rehab Potential Good    PT Frequency 2x / week    PT Duration 12 weeks    PT Treatment/Interventions ADLs/Self Care Home Management;Cryotherapy;Electrical Stimulation;Moist Heat;Ultrasound;DME Instruction;Gait training;Stair training;Functional mobility training;Therapeutic activities;Therapeutic exercise;Balance training;Neuromuscular re-education;Patient/family education;Manual techniques;Passive range of motion;Dry needling;Vestibular    PT Next Visit Plan balance trying to facilitate anterior weight shift, leg strength, safe use of rollator.    PT Home Exercise Plan Access Code: [email or web address]    Consulted and Agree with Plan of Care Patient           Patient will benefit from skilled therapeutic intervention in order to improve the following deficits and impairments:  Abnormal gait, Decreased activity tolerance, Cardiopulmonary status limiting activity, Decreased balance, Decreased coordination, Decreased endurance, Decreased knowledge of use of DME, Decreased mobility, Decreased range of motion, Decreased strength, Impaired flexibility, Postural dysfunction, Pain  Visit Diagnosis: Other abnormalities of gait and mobility  Unsteadiness on feet  Abnormal posture  Muscle weakness (generalized)  Repeated falls  Pain in left leg  Pain in right leg  Weakness generalized     Problem List Patient Active Problem List   Diagnosis Date Noted  . At risk for falls 12/04/2019  . Unsteady gait 12/04/2019  . Acquired thrombophilia ([place]) 07/23/2019  . Chronic combined systolic and diastolic heart failure ([place]) 08/29/2018  . Pacemaker 08/29/2018  . History of transient ischemic attack (TIA) 08/29/2018  . Lumbar pain 06/09/2018  . Senile purpura ([place]) 05/11/2018   . Complete heart block ([place])   . Paroxysmal atrial flutter ([place]) 03/06/2018  . Atherosclerosis of aorta ([place]) 08/23/2016  . History of basal cell cancer 08/01/2015  . Major depression in full remission ([place]) 09/11/2014  . Essential hypertension 01/01/2014  . Vitamin D deficiency 01/01/2014  . Medication management 01/01/2014  . Other abnormal glucose 01/01/2014  . Nonsustained ventricular tachycardia ([place]) 05/23/2013  . CAD (coronary artery disease) 01/08/2013  . Cardiomyopathy, ischemic 01/08/2013  . Hyperlipidemia 01/08/2013  . LBBB (left bundle branch block) 01/08/2013    Silvestre Mesi 02/20/2020, 3:15 PM  [facility] Physical Therapy [address] [place], [place], [address] Phone: [phone]   Fax:  [phone]  Name: Paul Frazier. MRN: [id number] Date of Birth: [birth year]

## 2020-02-21 ENCOUNTER — Telehealth: Payer: Self-pay | Admitting: *Deleted

## 2020-02-21 NOTE — Telephone Encounter (Signed)
Patient called and reported he is having edema in feet and ankles, which resolves at during night. The patient states he is taking the Furosemide 40 mg ,with limited output. Per Dr Melford Aase, the patient was advised to call Dr Sallyanne Kuster, who gave him Furosemide 40 mg in 01/2020.

## 2020-02-22 DIAGNOSIS — T22131S Burn of first degree of right upper arm, sequela: Secondary | ICD-10-CM | POA: Diagnosis not present

## 2020-02-22 DIAGNOSIS — Z03818 Encounter for observation for suspected exposure to other biological agents ruled out: Secondary | ICD-10-CM | POA: Diagnosis not present

## 2020-02-22 DIAGNOSIS — Z20822 Contact with and (suspected) exposure to covid-19: Secondary | ICD-10-CM | POA: Diagnosis not present

## 2020-02-25 ENCOUNTER — Ambulatory Visit (INDEPENDENT_AMBULATORY_CARE_PROVIDER_SITE_OTHER): Payer: Medicare Other | Admitting: Physical Therapy

## 2020-02-25 DIAGNOSIS — R293 Abnormal posture: Secondary | ICD-10-CM | POA: Diagnosis not present

## 2020-02-25 DIAGNOSIS — R2681 Unsteadiness on feet: Secondary | ICD-10-CM

## 2020-02-25 DIAGNOSIS — M6281 Muscle weakness (generalized): Secondary | ICD-10-CM | POA: Diagnosis not present

## 2020-02-25 DIAGNOSIS — R296 Repeated falls: Secondary | ICD-10-CM

## 2020-02-25 DIAGNOSIS — R2689 Other abnormalities of gait and mobility: Secondary | ICD-10-CM

## 2020-02-25 NOTE — Therapy (Signed)
[facility] Physical Therapy [address] [place], [place], [address] Phone: [phone]   Fax:  [phone]  Physical Therapy Treatment  Patient Details  Name: Kyro Joswick. MRN: [id number] Date of Birth: August 04, 1932 Referring Provider (PT): Liane Comber, NP   Encounter Date: 02/25/2020   PT End of Session - 02/25/20 1520    Visit Number 15    Number of Visits 25    Date for PT Re-Evaluation 03/20/20    Authorization Type Medicare A & B and AARP    PT Start Time 1435    PT Stop Time 1515    PT Time Calculation (min) 40 min    Activity Tolerance No increased pain;Patient limited by fatigue    Behavior During Therapy [facility] for tasks assessed/performed           Past Medical History:  Diagnosis Date  . Arthritis   . CAD (coronary artery disease)   . Hyperlipidemia   . Hypertension   . Old myocardial infarct   . Right inguinal hernia 04/19/2012  . S/P CABG x 5 08/29/1984   LIMA to LAD,SVG to diagonal,SVG to OM,SVG to PDA and PLA  . Tremor, essential     Past Surgical History:  Procedure Laterality Date  . A-FLUTTER ABLATION N/A 03/09/2018   Procedure: A-FLUTTER ABLATION;  Surgeon: Evans Lance, MD;  Location: [place] [facility];  Service: Cardiovascular;  Laterality: N/A;  . CHOLECYSTECTOMY  10/1990  . CORONARY ARTERY BYPASS GRAFT  1986   x5 LIMA to LAD,SVG to diagonal,SVG to OM,SVG to PDA and PLA  . CORONARY STENT PLACEMENT    . INGUINAL HERNIA REPAIR  04/25/2012   right- laparoscopic  . PACEMAKER IMPLANT N/A 03/09/2018   Procedure: PACEMAKER IMPLANT;  Surgeon: Evans Lance, MD;  Location: [place] [facility];  Service: Cardiovascular;  Laterality: N/A;  . R/P Myoview  12/27/2008   no ischemia  . TEE WITHOUT CARDIOVERSION N/A 03/09/2018   Procedure: TRANSESOPHAGEAL ECHOCARDIOGRAM (TEE);  Surgeon: Fay Records, MD;  Location: [facility];  Service: Cardiovascular;  Laterality: N/A;  Bubble Study  . US ECHOCARDIOGRAPHY  12/27/2008    concentric LVH,mild to mod MR, TR and pulmonary hypertension. EF 45-50%    There were no vitals filed for this visit.   Subjective Assessment - 02/25/20 1519    Subjective no pain reported today but does relay that yesterday was a bad day, his legs felt weak and that they felt like they would give out on him. He has not fallen since last visit.    Pertinent History spinal stenosis/radiculopathy, chronic peripheral neuropathy, CHF, CAD, complete heart block, pacemaker, basal cell CA, atheroscelerosis on Aorta    Patient Stated Goals To improve balance to stop falls.    Pain Onset 1 to 4 weeks ago                             [facility] Adult PT Treatment/Exercise - 02/25/20 0001      Neuro Re-ed    Neuro Re-ed Details  balance: with UE support on rocker board 1 min then 2 min A-P.  Standing on decline for anterior weight shift with balance 1 min X 2 reps without UE support. moved to bars: fwd walking without UE support then toe walking with UE suport with cues to not bend knees and try to keep weight anteriorly.       Exercises   Other Exercises  Nu  step L5 X10 min. Sit to stands no UE support with airex pad in seat 2X10 (needs min A at time for balance), Leg press 100 lbs bilat push 3X10. Seated ankle PF 2X20 reps with green, bilat                     PT Short Term Goals - 02/06/20 1554      PT SHORT TERM GOAL #1   Title Patient verbalizes & demonstrates understanding on updated HEP.  (All STGs Target Date: 02/29/2020)    Baseline NOT MET 02/04/2020    Time 1    Period Months    Status Revised    Target Date 02/29/20      PT SHORT TERM GOAL #2   Title Patient ambulates with rollator walker negotiating obstacles with supervision.    Time 1    Period Months    Status Revised    Target Date 02/29/20      PT SHORT TERM GOAL #3   Title Patient able to pick up items from floor and reaches 10" with locked rollator walker support with supervision.    Time 1     Period Months    Status Revised    Target Date 02/29/20      PT SHORT TERM GOAL #4   Title Patient reports >20% improvement in pain with activiites.    Time 1    Period Months    Status On-going    Target Date 02/29/20      PT SHORT TERM GOAL #5   Title Patient negotiates ramps & curbs with rollator walker with supervision.    Time 4    Period Weeks    Status New    Target Date 02/29/20             PT Long Term Goals - 02/04/20 2115      PT LONG TERM GOAL #1   Title Patient verbalizes & demonstrates understanding of ongoing HEP / fitness plan.  (All LTGs Target Date: 03/21/2020)    Time 12    Period Weeks    Status On-going    Target Date 03/21/20      PT LONG TERM GOAL #2   Title Berg Balance Tasks > 45/56 with light touch on walker    Time 12    Period Weeks    Status Revised    Target Date 03/21/20      PT LONG TERM GOAL #3   Title Functional Gait Assessment >/= 19/30 with rollator walker indicating lower fall risk.    Time 12    Period Weeks    Status Revised    Target Date 03/21/20      PT LONG TERM GOAL #4   Title Patient verbalizes & demonstrates understanding how to decrease leg pain to improve standing & gait tolerance.    Time 12    Period Weeks    Status On-going    Target Date 03/21/20      PT LONG TERM GOAL #5   Title Patient ambulates 300' & negotiates ramps/curbs with rollator walker modified independent.    Time 12    Period Weeks    Status Revised    Target Date 03/21/20                 Plan - 02/25/20 1522    Clinical Impression Statement Focused more on leg strength today as his legs have been feeling weak. He had good tolerance  to exercises but continues to stuggle with balance and PT will continue to work on leg strength and balance with him.    Personal Factors and Comorbidities Age;Comorbidity 3+;Fitness;Past/Current Experience    Comorbidities spinal stenosis/radiculopathy, chronic peripheral neuropathy, CHF, CAD, complete  heart block, pacemaker, basal cell CA, atheroscelerosis on Aorta,    Examination-Activity Limitations Locomotion Level;Stairs;Stand;Transfers    Examination-Participation Restrictions Community Activity    Stability/Clinical Decision Making Stable/Uncomplicated    Rehab Potential Good    PT Frequency 2x / week    PT Duration 12 weeks    PT Treatment/Interventions ADLs/Self Care Home Management;Cryotherapy;Electrical Stimulation;Moist Heat;Ultrasound;DME Instruction;Gait training;Stair training;Functional mobility training;Therapeutic activities;Therapeutic exercise;Balance training;Neuromuscular re-education;Patient/family education;Manual techniques;Passive range of motion;Dry needling;Vestibular    PT Next Visit Plan balance trying to facilitate anterior weight shift, leg strength, safe use of rollator.    PT Home Exercise Plan Access Code: [email or web address]    Consulted and Agree with Plan of Care Patient           Patient will benefit from skilled therapeutic intervention in order to improve the following deficits and impairments:  Abnormal gait, Decreased activity tolerance, Cardiopulmonary status limiting activity, Decreased balance, Decreased coordination, Decreased endurance, Decreased knowledge of use of DME, Decreased mobility, Decreased range of motion, Decreased strength, Impaired flexibility, Postural dysfunction, Pain  Visit Diagnosis: Other abnormalities of gait and mobility  Unsteadiness on feet  Abnormal posture  Muscle weakness (generalized)  Repeated falls     Problem List Patient Active Problem List   Diagnosis Date Noted  . At risk for falls 12/04/2019  . Unsteady gait 12/04/2019  . Acquired thrombophilia ([place]) 07/23/2019  . Chronic combined systolic and diastolic heart failure ([place]) 08/29/2018  . Pacemaker 08/29/2018  . History of transient ischemic attack (TIA) 08/29/2018  . Lumbar pain 06/09/2018  . Senile purpura ([place]) 05/11/2018  . Complete heart block  ([place])   . Paroxysmal atrial flutter ([place]) 03/06/2018  . Atherosclerosis of aorta ([place]) 08/23/2016  . History of basal cell cancer 08/01/2015  . Major depression in full remission ([place]) 09/11/2014  . Essential hypertension 01/01/2014  . Vitamin D deficiency 01/01/2014  . Medication management 01/01/2014  . Other abnormal glucose 01/01/2014  . Nonsustained ventricular tachycardia ([place]) 05/23/2013  . CAD (coronary artery disease) 01/08/2013  . Cardiomyopathy, ischemic 01/08/2013  . Hyperlipidemia 01/08/2013  . LBBB (left bundle branch block) 01/08/2013    Silvestre Mesi 02/25/2020, 3:29 PM  [facility] Physical Therapy [address] [place], [place], [address] Phone: [phone]   Fax:  [phone]  Name: Ramadan Couey. MRN: [id number] Date of Birth: July 30, 1932

## 2020-02-27 ENCOUNTER — Encounter: Payer: Self-pay | Admitting: Physical Therapy

## 2020-02-27 ENCOUNTER — Ambulatory Visit (INDEPENDENT_AMBULATORY_CARE_PROVIDER_SITE_OTHER): Payer: Medicare Other | Admitting: Physical Therapy

## 2020-02-27 ENCOUNTER — Other Ambulatory Visit: Payer: Self-pay

## 2020-02-27 DIAGNOSIS — R293 Abnormal posture: Secondary | ICD-10-CM

## 2020-02-27 DIAGNOSIS — R296 Repeated falls: Secondary | ICD-10-CM

## 2020-02-27 DIAGNOSIS — R2689 Other abnormalities of gait and mobility: Secondary | ICD-10-CM

## 2020-02-27 DIAGNOSIS — R2681 Unsteadiness on feet: Secondary | ICD-10-CM | POA: Diagnosis not present

## 2020-02-27 DIAGNOSIS — M6281 Muscle weakness (generalized): Secondary | ICD-10-CM

## 2020-02-27 NOTE — Therapy (Signed)
[facility] Physical Therapy [address] [place], [place], [address] Phone: [phone]   Fax:  [phone]  Physical Therapy Treatment  Patient Details  Name: Paul Frazier. MRN: [id number] Date of Birth: 03-18-1933 Referring Provider (PT): Liane Comber, NP   Encounter Date: 02/27/2020   PT End of Session - 02/27/20 1514    Visit Number 16    Number of Visits 25    Date for PT Re-Evaluation 03/20/20    Authorization Type Medicare A & B and AARP    PT Start Time 1430    PT Stop Time 1512    PT Time Calculation (min) 42 min    Activity Tolerance No increased pain;Patient limited by fatigue    Behavior During Therapy [facility] for tasks assessed/performed           Past Medical History:  Diagnosis Date  . Arthritis   . CAD (coronary artery disease)   . Hyperlipidemia   . Hypertension   . Old myocardial infarct   . Right inguinal hernia 04/19/2012  . S/P CABG x 5 08/29/1984   LIMA to LAD,SVG to diagonal,SVG to OM,SVG to PDA and PLA  . Tremor, essential     Past Surgical History:  Procedure Laterality Date  . A-FLUTTER ABLATION N/A 03/09/2018   Procedure: A-FLUTTER ABLATION;  Surgeon: Evans Lance, MD;  Location: [place] [facility];  Service: Cardiovascular;  Laterality: N/A;  . CHOLECYSTECTOMY  10/1990  . CORONARY ARTERY BYPASS GRAFT  1986   x5 LIMA to LAD,SVG to diagonal,SVG to OM,SVG to PDA and PLA  . CORONARY STENT PLACEMENT    . INGUINAL HERNIA REPAIR  04/25/2012   right- laparoscopic  . PACEMAKER IMPLANT N/A 03/09/2018   Procedure: PACEMAKER IMPLANT;  Surgeon: Evans Lance, MD;  Location: [place] [facility];  Service: Cardiovascular;  Laterality: N/A;  . R/P Myoview  12/27/2008   no ischemia  . TEE WITHOUT CARDIOVERSION N/A 03/09/2018   Procedure: TRANSESOPHAGEAL ECHOCARDIOGRAM (TEE);  Surgeon: Fay Records, MD;  Location: [facility] ENDOSCOPY;  Service: Cardiovascular;  Laterality: N/A;  Bubble Study  . US ECHOCARDIOGRAPHY  12/27/2008    concentric LVH,mild to mod MR, TR and pulmonary hypertension. EF 45-50%    There were no vitals filed for this visit.   Subjective Assessment - 02/27/20 1446    Subjective relays his legs feel better today, denies pain upon arrival. No falls to report.    Pertinent History spinal stenosis/radiculopathy, chronic peripheral neuropathy, CHF, CAD, complete heart block, pacemaker, basal cell CA, atheroscelerosis on Aorta    Patient Stated Goals To improve balance to stop falls.    Currently in Pain? No/denies    Pain Onset 1 to 4 weeks ago              [facility] PT Assessment - 02/27/20 0001      Standardized Balance Assessment   Standardized Balance Assessment Five Times Sit to Stand    Five times sit to stand comments  11.4                         [facility] Adult PT Treatment/Exercise - 02/27/20 0001      Neuro Re-ed    Neuro Re-ed Details  balance: with UE support on rocker board 2 min A-P.  moved to bars: toe walking with bilat UE suport with cues to not bend knees and try to keep weight anteriorly.  Then sidestepping, then standing fwd reaches with  heels elevated for more anterior weight shift 10 reaches X 2 reps. SL alternating heel taps X 10 both sides.      Exercises   Other Exercises  Nu step L6 X10 min.  Leg press 106 lbs bilat push 3X10. Seated ankle PF 20 reps with blue band performed unilat on both sides with cues for slower eccentric control. Standing H/T raises X 10 reps bilat.                     PT Short Term Goals - 02/06/20 1554      PT SHORT TERM GOAL #1   Title Patient verbalizes & demonstrates understanding on updated HEP.  (All STGs Target Date: 02/29/2020)    Baseline NOT MET 02/04/2020    Time 1    Period Months    Status Revised    Target Date 02/29/20      PT SHORT TERM GOAL #2   Title Patient ambulates with rollator walker negotiating obstacles with supervision.    Time 1    Period Months    Status Revised    Target Date 02/29/20       PT SHORT TERM GOAL #3   Title Patient able to pick up items from floor and reaches 10" with locked rollator walker support with supervision.    Time 1    Period Months    Status Revised    Target Date 02/29/20      PT SHORT TERM GOAL #4   Title Patient reports >20% improvement in pain with activiites.    Time 1    Period Months    Status On-going    Target Date 02/29/20      PT SHORT TERM GOAL #5   Title Patient negotiates ramps & curbs with rollator walker with supervision.    Time 4    Period Weeks    Status New    Target Date 02/29/20             PT Long Term Goals - 02/04/20 2115      PT LONG TERM GOAL #1   Title Patient verbalizes & demonstrates understanding of ongoing HEP / fitness plan.  (All LTGs Target Date: 03/21/2020)    Time 12    Period Weeks    Status On-going    Target Date 03/21/20      PT LONG TERM GOAL #2   Title Berg Balance Tasks > 45/56 with light touch on walker    Time 12    Period Weeks    Status Revised    Target Date 03/21/20      PT LONG TERM GOAL #3   Title Functional Gait Assessment >/= 19/30 with rollator walker indicating lower fall risk.    Time 12    Period Weeks    Status Revised    Target Date 03/21/20      PT LONG TERM GOAL #4   Title Patient verbalizes & demonstrates understanding how to decrease leg pain to improve standing & gait tolerance.    Time 12    Period Weeks    Status On-going    Target Date 03/21/20      PT LONG TERM GOAL #5   Title Patient ambulates 300' & negotiates ramps/curbs with rollator walker modified independent.    Time 12    Period Weeks    Status Revised    Target Date 03/21/20  Plan - 02/27/20 1516    Clinical Impression Statement He showed improvements in overall leg strength today and was able to progress his resistance with good tolerance although he does still get fatigued easily. Continued with balance training but he continues to struggle with shifting  weight anteriorly. He showed improved 5 times sit to stand time today demonstrating overall improved leg strength and balance.    Personal Factors and Comorbidities Age;Comorbidity 3+;Fitness;Past/Current Experience    Comorbidities spinal stenosis/radiculopathy, chronic peripheral neuropathy, CHF, CAD, complete heart block, pacemaker, basal cell CA, atheroscelerosis on Aorta,    Examination-Activity Limitations Locomotion Level;Stairs;Stand;Transfers    Examination-Participation Restrictions Community Activity    Stability/Clinical Decision Making Stable/Uncomplicated    Rehab Potential Good    PT Frequency 2x / week    PT Duration 12 weeks    PT Treatment/Interventions ADLs/Self Care Home Management;Cryotherapy;Electrical Stimulation;Moist Heat;Ultrasound;DME Instruction;Gait training;Stair training;Functional mobility training;Therapeutic activities;Therapeutic exercise;Balance training;Neuromuscular re-education;Patient/family education;Manual techniques;Passive range of motion;Dry needling;Vestibular    PT Next Visit Plan balance trying to facilitate anterior weight shift, leg strength, safe use of rollator.    PT Home Exercise Plan Access Code: [email or web address]    Consulted and Agree with Plan of Care Patient           Patient will benefit from skilled therapeutic intervention in order to improve the following deficits and impairments:  Abnormal gait, Decreased activity tolerance, Cardiopulmonary status limiting activity, Decreased balance, Decreased coordination, Decreased endurance, Decreased knowledge of use of DME, Decreased mobility, Decreased range of motion, Decreased strength, Impaired flexibility, Postural dysfunction, Pain  Visit Diagnosis: Other abnormalities of gait and mobility  Unsteadiness on feet  Abnormal posture  Muscle weakness (generalized)  Repeated falls     Problem List Patient Active Problem List   Diagnosis Date Noted  . At risk for falls 12/04/2019  .  Unsteady gait 12/04/2019  . Acquired thrombophilia ([place]) 07/23/2019  . Chronic combined systolic and diastolic heart failure ([place]) 08/29/2018  . Pacemaker 08/29/2018  . History of transient ischemic attack (TIA) 08/29/2018  . Lumbar pain 06/09/2018  . Senile purpura ([place]) 05/11/2018  . Complete heart block ([place])   . Paroxysmal atrial flutter ([place]) 03/06/2018  . Atherosclerosis of aorta ([place]) 08/23/2016  . History of basal cell cancer 08/01/2015  . Major depression in full remission ([place]) 09/11/2014  . Essential hypertension 01/01/2014  . Vitamin D deficiency 01/01/2014  . Medication management 01/01/2014  . Other abnormal glucose 01/01/2014  . Nonsustained ventricular tachycardia ([place]) 05/23/2013  . CAD (coronary artery disease) 01/08/2013  . Cardiomyopathy, ischemic 01/08/2013  . Hyperlipidemia 01/08/2013  . LBBB (left bundle branch block) 01/08/2013    Silvestre Mesi 02/27/2020, 3:20 PM  [facility] Physical Therapy [address] [place], [place], [address] Phone: [phone]   Fax:  [phone]  Name: Tramayne Sebesta. MRN: [id number] Date of Birth: 1933-03-30

## 2020-03-03 ENCOUNTER — Encounter: Payer: Medicare Other | Admitting: Physical Therapy

## 2020-03-05 ENCOUNTER — Ambulatory Visit (INDEPENDENT_AMBULATORY_CARE_PROVIDER_SITE_OTHER): Payer: Medicare Other | Admitting: Physical Therapy

## 2020-03-05 ENCOUNTER — Other Ambulatory Visit: Payer: Self-pay

## 2020-03-05 DIAGNOSIS — R2689 Other abnormalities of gait and mobility: Secondary | ICD-10-CM

## 2020-03-05 DIAGNOSIS — R296 Repeated falls: Secondary | ICD-10-CM

## 2020-03-05 DIAGNOSIS — R2681 Unsteadiness on feet: Secondary | ICD-10-CM

## 2020-03-05 DIAGNOSIS — M6281 Muscle weakness (generalized): Secondary | ICD-10-CM | POA: Diagnosis not present

## 2020-03-05 DIAGNOSIS — M79604 Pain in right leg: Secondary | ICD-10-CM

## 2020-03-05 DIAGNOSIS — R293 Abnormal posture: Secondary | ICD-10-CM | POA: Diagnosis not present

## 2020-03-05 DIAGNOSIS — M79605 Pain in left leg: Secondary | ICD-10-CM | POA: Diagnosis not present

## 2020-03-05 NOTE — Therapy (Signed)
[facility] Physical Therapy [address] [place], [place], [address] Phone: [phone]   Fax:  [phone]  Physical Therapy Treatment  Patient Details  Name: Paul Frazier. MRN: [id number] Date of Birth: [birth year] Referring Provider (PT): Liane Comber, NP   Encounter Date: 03/05/2020   PT End of Session - 03/05/20 1516    Visit Number 17    Number of Visits 25    Date for PT Re-Evaluation 03/20/20    Authorization Type Medicare A & B and AARP    PT Start Time 1430    PT Stop Time 1512    PT Time Calculation (min) 42 min    Activity Tolerance No increased pain;Patient limited by fatigue    Behavior During Therapy [facility] for tasks assessed/performed           Past Medical History:  Diagnosis Date  . Arthritis   . CAD (coronary artery disease)   . Hyperlipidemia   . Hypertension   . Old myocardial infarct   . Right inguinal hernia 04/19/2012  . S/P CABG x 5 08/29/1984   LIMA to LAD,SVG to diagonal,SVG to OM,SVG to PDA and PLA  . Tremor, essential     Past Surgical History:  Procedure Laterality Date  . A-FLUTTER ABLATION N/A 03/09/2018   Procedure: A-FLUTTER ABLATION;  Surgeon: Evans Lance, MD;  Location: [place] [facility];  Service: Cardiovascular;  Laterality: N/A;  . CHOLECYSTECTOMY  10/1990  . CORONARY ARTERY BYPASS GRAFT  1986   x5 LIMA to LAD,SVG to diagonal,SVG to OM,SVG to PDA and PLA  . CORONARY STENT PLACEMENT    . INGUINAL HERNIA REPAIR  04/25/2012   right- laparoscopic  . PACEMAKER IMPLANT N/A 03/09/2018   Procedure: PACEMAKER IMPLANT;  Surgeon: Evans Lance, MD;  Location: [place] [facility];  Service: Cardiovascular;  Laterality: N/A;  . R/P Myoview  12/27/2008   no ischemia  . TEE WITHOUT CARDIOVERSION N/A 03/09/2018   Procedure: TRANSESOPHAGEAL ECHOCARDIOGRAM (TEE);  Surgeon: Fay Records, MD;  Location: [facility] ENDOSCOPY;  Service: Cardiovascular;  Laterality: N/A;  Bubble Study  . US ECHOCARDIOGRAPHY  12/27/2008    concentric LVH,mild to mod MR, TR and pulmonary hypertension. EF 45-50%    There were no vitals filed for this visit.   Subjective Assessment - 03/05/20 1454    Subjective relays he feels good today, no pain and his legs are not feeling so weak.              [facility] Adult PT Treatment/Exercise - 03/05/20 0001      Neuro Re-ed    Neuro Re-ed Details  balance: with UE support in bars on rocker board 2 min A-P. toe walking with bilat UE suport with cues to not bend knees and try to keep weight anteriorly up/down X 3 reps.  Then sidestepping, then standing fwd reaches for anterior weight shifting, SL alternating heel taps X 10 both sides.      Exercises   Other Exercises  Nu step L6 X10 min.  Leg press 112 lbs bilat push 3X10. Seated ankle PF 20 reps with blue band performed unilat on both sides with cues for slower eccentric control.  Sit to stands no UE support with airex pad in chair 2X10                    PT Short Term Goals - 02/06/20 1554      PT SHORT TERM GOAL #1   Title Patient  verbalizes & demonstrates understanding on updated HEP.  (All STGs Target Date: 02/29/2020)    Baseline NOT MET 02/04/2020    Time 1    Period Months    Status Revised    Target Date 02/29/20      PT SHORT TERM GOAL #2   Title Patient ambulates with rollator walker negotiating obstacles with supervision.    Time 1    Period Months    Status Revised    Target Date 02/29/20      PT SHORT TERM GOAL #3   Title Patient able to pick up items from floor and reaches 10" with locked rollator walker support with supervision.    Time 1    Period Months    Status Revised    Target Date 02/29/20      PT SHORT TERM GOAL #4   Title Patient reports >20% improvement in pain with activiites.    Time 1    Period Months    Status On-going    Target Date 02/29/20      PT SHORT TERM GOAL #5   Title Patient negotiates ramps & curbs with rollator walker with supervision.    Time 4    Period Weeks     Status New    Target Date 02/29/20             PT Long Term Goals - 02/04/20 2115      PT LONG TERM GOAL #1   Title Patient verbalizes & demonstrates understanding of ongoing HEP / fitness plan.  (All LTGs Target Date: 03/21/2020)    Time 12    Period Weeks    Status On-going    Target Date 03/21/20      PT LONG TERM GOAL #2   Title Berg Balance Tasks > 45/56 with light touch on walker    Time 12    Period Weeks    Status Revised    Target Date 03/21/20      PT LONG TERM GOAL #3   Title Functional Gait Assessment >/= 19/30 with rollator walker indicating lower fall risk.    Time 12    Period Weeks    Status Revised    Target Date 03/21/20      PT LONG TERM GOAL #4   Title Patient verbalizes & demonstrates understanding how to decrease leg pain to improve standing & gait tolerance.    Time 12    Period Weeks    Status On-going    Target Date 03/21/20      PT LONG TERM GOAL #5   Title Patient ambulates 300' & negotiates ramps/curbs with rollator walker modified independent.    Time 12    Period Weeks    Status Revised    Target Date 03/21/20                 Plan - 03/05/20 1517    Clinical Impression Statement Able to progress resistance today on leg press. And had better performance with sit to stands, However he continues to have difficulty with balance but has not fallen since PT recommending he use rollator. PT will continue to work on balance, leg strength, and endurance as tolerated.    Personal Factors and Comorbidities Age;Comorbidity 3+;Fitness;Past/Current Experience    Comorbidities spinal stenosis/radiculopathy, chronic peripheral neuropathy, CHF, CAD, complete heart block, pacemaker, basal cell CA, atheroscelerosis on Aorta,    Examination-Activity Limitations Locomotion Level;Stairs;Stand;Transfers    Examination-Participation Restrictions Community Activity    Stability/Clinical  Decision Making Stable/Uncomplicated    Rehab Potential Good     PT Frequency 2x / week    PT Duration 12 weeks    PT Treatment/Interventions ADLs/Self Care Home Management;Cryotherapy;Electrical Stimulation;Moist Heat;Ultrasound;DME Instruction;Gait training;Stair training;Functional mobility training;Therapeutic activities;Therapeutic exercise;Balance training;Neuromuscular re-education;Patient/family education;Manual techniques;Passive range of motion;Dry needling;Vestibular    PT Next Visit Plan balance trying to facilitate anterior weight shift, leg strength, safe use of rollator.    PT Home Exercise Plan Access Code: [email or web address]    Consulted and Agree with Plan of Care Patient           Patient will benefit from skilled therapeutic intervention in order to improve the following deficits and impairments:  Abnormal gait, Decreased activity tolerance, Cardiopulmonary status limiting activity, Decreased balance, Decreased coordination, Decreased endurance, Decreased knowledge of use of DME, Decreased mobility, Decreased range of motion, Decreased strength, Impaired flexibility, Postural dysfunction, Pain  Visit Diagnosis: Other abnormalities of gait and mobility  Unsteadiness on feet  Abnormal posture  Muscle weakness (generalized)  Repeated falls  Pain in left leg  Pain in right leg     Problem List Patient Active Problem List   Diagnosis Date Noted  . At risk for falls 12/04/2019  . Unsteady gait 12/04/2019  . Acquired thrombophilia ([place]) 07/23/2019  . Chronic combined systolic and diastolic heart failure ([place]) 08/29/2018  . Pacemaker 08/29/2018  . History of transient ischemic attack (TIA) 08/29/2018  . Lumbar pain 06/09/2018  . Senile purpura ([place]) 05/11/2018  . Complete heart block ([place])   . Paroxysmal atrial flutter ([place]) 03/06/2018  . Atherosclerosis of aorta ([place]) 08/23/2016  . History of basal cell cancer 08/01/2015  . Major depression in full remission ([place]) 09/11/2014  . Essential hypertension 01/01/2014  . Vitamin D  deficiency 01/01/2014  . Medication management 01/01/2014  . Other abnormal glucose 01/01/2014  . Nonsustained ventricular tachycardia ([place]) 05/23/2013  . CAD (coronary artery disease) 01/08/2013  . Cardiomyopathy, ischemic 01/08/2013  . Hyperlipidemia 01/08/2013  . LBBB (left bundle branch block) 01/08/2013    Silvestre Mesi 03/05/2020, 3:20 PM  [facility] Physical Therapy [address] [place], [place], [address] Phone: [phone]   Fax:  [phone]  Name: Zekiel Torian. MRN: [id number] Date of Birth: 1933/06/20

## 2020-03-10 ENCOUNTER — Ambulatory Visit (INDEPENDENT_AMBULATORY_CARE_PROVIDER_SITE_OTHER): Payer: Medicare Other | Admitting: Physical Therapy

## 2020-03-10 ENCOUNTER — Other Ambulatory Visit: Payer: Self-pay

## 2020-03-10 DIAGNOSIS — R296 Repeated falls: Secondary | ICD-10-CM | POA: Diagnosis not present

## 2020-03-10 DIAGNOSIS — M79605 Pain in left leg: Secondary | ICD-10-CM

## 2020-03-10 DIAGNOSIS — R2681 Unsteadiness on feet: Secondary | ICD-10-CM | POA: Diagnosis not present

## 2020-03-10 DIAGNOSIS — M79604 Pain in right leg: Secondary | ICD-10-CM

## 2020-03-10 DIAGNOSIS — R2689 Other abnormalities of gait and mobility: Secondary | ICD-10-CM | POA: Diagnosis not present

## 2020-03-10 DIAGNOSIS — Z9181 History of falling: Secondary | ICD-10-CM | POA: Diagnosis not present

## 2020-03-10 DIAGNOSIS — R531 Weakness: Secondary | ICD-10-CM | POA: Diagnosis not present

## 2020-03-10 DIAGNOSIS — M6281 Muscle weakness (generalized): Secondary | ICD-10-CM

## 2020-03-10 DIAGNOSIS — R293 Abnormal posture: Secondary | ICD-10-CM | POA: Diagnosis not present

## 2020-03-10 NOTE — Therapy (Signed)
[facility] Physical Therapy [address] [place], [place], [address] Phone: [phone]   Fax:  [phone]  Physical Therapy Treatment  Patient Details  Name: Paul Frazier. MRN: [id number] Date of Birth: 1932/10/10 Referring Provider (PT): Liane Comber, NP   Encounter Date: 03/10/2020   PT End of Session - 03/10/20 1527    Visit Number 18    Number of Visits 25    Date for PT Re-Evaluation 03/20/20    Authorization Type Medicare A & B and AARP    PT Start Time 7989    PT Stop Time 1517    PT Time Calculation (min) 45 min    Activity Tolerance Patient tolerated treatment well    Behavior During Therapy WFL for tasks assessed/performed           Past Medical History:  Diagnosis Date  . Arthritis   . CAD (coronary artery disease)   . Hyperlipidemia   . Hypertension   . Old myocardial infarct   . Right inguinal hernia 04/19/2012  . S/P CABG x 5 08/29/1984   LIMA to LAD,SVG to diagonal,SVG to OM,SVG to PDA and PLA  . Tremor, essential     Past Surgical History:  Procedure Laterality Date  . A-FLUTTER ABLATION N/A 03/09/2018   Procedure: A-FLUTTER ABLATION;  Surgeon: Evans Lance, MD;  Location: [place] [facility];  Service: Cardiovascular;  Laterality: N/A;  . CHOLECYSTECTOMY  10/1990  . CORONARY ARTERY BYPASS GRAFT  1986   x5 LIMA to LAD,SVG to diagonal,SVG to OM,SVG to PDA and PLA  . CORONARY STENT PLACEMENT    . INGUINAL HERNIA REPAIR  04/25/2012   right- laparoscopic  . PACEMAKER IMPLANT N/A 03/09/2018   Procedure: PACEMAKER IMPLANT;  Surgeon: Evans Lance, MD;  Location: [place] [facility];  Service: Cardiovascular;  Laterality: N/A;  . R/P Myoview  12/27/2008   no ischemia  . TEE WITHOUT CARDIOVERSION N/A 03/09/2018   Procedure: TRANSESOPHAGEAL ECHOCARDIOGRAM (TEE);  Surgeon: Fay Records, MD;  [facility] ENDOSCOPY;  Service: Cardiovascular;  Laterality: N/A;  Bubble Study  . US ECHOCARDIOGRAPHY  12/27/2008   concentric LVH,mild  to mod MR, TR and pulmonary hypertension. EF 45-50%    There were no vitals filed for this visit.   Subjective Assessment - 03/10/20 1522    Subjective relays he feels good today, no pain and his legs are feeling better, he is still concerned with his balance.    Pertinent History spinal stenosis/radiculopathy, chronic peripheral neuropathy, CHF, CAD, complete heart block, pacemaker, basal cell CA, atheroscelerosis on Aorta    Patient Stated Goals To improve balance to stop falls.    Pain Onset 1 to 4 weeks ago              [facility] Adult PT Treatment/Exercise - 03/10/20 0001      Neuro Re-ed    Neuro Re-ed Details  standing at door at armlenght away with towel reaching up X 10 reps then up and over  X 10 reps each to facilitate anterior weight shift and plantar flexion recuritment. balance in bars for walking with head turns vertical, then horizontal,  Then step taps X 15 alternating each side. Then balance with feet apart and eyes closed. Walking on toes in bars.       Exercises   Other Exercises  Nu step L6 X10 min.  Slantboard stretch 30 sec  X3. heel raises with UE support to offload some due to weakness. Leg press 118  lbs bilat push 3X10. Seated ankle PF 20 reps with blue band 20 reps X 2 sets, performed unilat on both sides .  Sit to stands no UE support from nu step chair.                    PT Short Term Goals - 02/06/20 1554      PT SHORT TERM GOAL #1   Title Patient verbalizes & demonstrates understanding on updated HEP.  (All STGs Target Date: 02/29/2020)    Baseline NOT MET 02/04/2020    Time 1    Period Months    Status Revised    Target Date 02/29/20      PT SHORT TERM GOAL #2   Title Patient ambulates with rollator walker negotiating obstacles with supervision.    Time 1    Period Months    Status Revised    Target Date 02/29/20      PT SHORT TERM GOAL #3   Title Patient able to pick up items from floor and reaches 10" with locked rollator walker  support with supervision.    Time 1    Period Months    Status Revised    Target Date 02/29/20      PT SHORT TERM GOAL #4   Title Patient reports >20% improvement in pain with activiites.    Time 1    Period Months    Status On-going    Target Date 02/29/20      PT SHORT TERM GOAL #5   Title Patient negotiates ramps & curbs with rollator walker with supervision.    Time 4    Period Weeks    Status New    Target Date 02/29/20             PT Long Term Goals - 02/04/20 2115      PT LONG TERM GOAL #1   Title Patient verbalizes & demonstrates understanding of ongoing HEP / fitness plan.  (All LTGs Target Date: 03/21/2020)    Time 12    Period Weeks    Status On-going    Target Date 03/21/20      PT LONG TERM GOAL #2   Title Berg Balance Tasks > 45/56 with light touch on walker    Time 12    Period Weeks    Status Revised    Target Date 03/21/20      PT LONG TERM GOAL #3   Title Functional Gait Assessment >/= 19/30 with rollator walker indicating lower fall risk.    Time 12    Period Weeks    Status Revised    Target Date 03/21/20      PT LONG TERM GOAL #4   Title Patient verbalizes & demonstrates understanding how to decrease leg pain to improve standing & gait tolerance.    Time 12    Period Weeks    Status On-going    Target Date 03/21/20      PT LONG TERM GOAL #5   Title Patient ambulates 300' & negotiates ramps/curbs with rollator walker modified independent.    Time 12    Period Weeks    Status Revised    Target Date 03/21/20                 Plan - 03/10/20 1530    Clinical Impression Statement He is getting better with leg strength but continues to have LOB posteriorly. PT recommended to continue to use rollator and he  has been compliant. PT will continue to foucs on PF strength and balance as these are his biggest deficits.    Personal Factors and Comorbidities Age;Comorbidity 3+;Fitness;Past/Current Experience    Comorbidities spinal  stenosis/radiculopathy, chronic peripheral neuropathy, CHF, CAD, complete heart block, pacemaker, basal cell CA, atheroscelerosis on Aorta,    Examination-Activity Limitations Locomotion Level;Stairs;Stand;Transfers    Examination-Participation Restrictions Community Activity    Stability/Clinical Decision Making Stable/Uncomplicated    Rehab Potential Good    PT Frequency 2x / week    PT Duration 12 weeks    PT Treatment/Interventions ADLs/Self Care Home Management;Cryotherapy;Electrical Stimulation;Moist Heat;Ultrasound;DME Instruction;Gait training;Stair training;Functional mobility training;Therapeutic activities;Therapeutic exercise;Balance training;Neuromuscular re-education;Patient/family education;Manual techniques;Passive range of motion;Dry needling;Vestibular    PT Next Visit Plan balance trying to facilitate anterior weight shift, leg strength, safe use of rollator.    PT Home Exercise Plan Access Code: [email or web address]    Consulted and Agree with Plan of Care Patient           Patient will benefit from skilled therapeutic intervention in order to improve the following deficits and impairments:  Abnormal gait, Decreased activity tolerance, Cardiopulmonary status limiting activity, Decreased balance, Decreased coordination, Decreased endurance, Decreased knowledge of use of DME, Decreased mobility, Decreased range of motion, Decreased strength, Impaired flexibility, Postural dysfunction, Pain  Visit Diagnosis: Other abnormalities of gait and mobility  Unsteadiness on feet  Abnormal posture  Muscle weakness (generalized)  Repeated falls  Pain in left leg  Pain in right leg  Weakness generalized  History of fall     Problem List Patient Active Problem List   Diagnosis Date Noted  . At risk for falls 12/04/2019  . Unsteady gait 12/04/2019  . Acquired thrombophilia ([place]) 07/23/2019  . Chronic combined systolic and diastolic heart failure ([place]) 08/29/2018  . Pacemaker  08/29/2018  . History of transient ischemic attack (TIA) 08/29/2018  . Lumbar pain 06/09/2018  . Senile purpura ([place]) 05/11/2018  . Complete heart block ([place])   . Paroxysmal atrial flutter ([place]) 03/06/2018  . Atherosclerosis of aorta ([place]) 08/23/2016  . History of basal cell cancer 08/01/2015  . Major depression in full remission ([place]) 09/11/2014  . Essential hypertension 01/01/2014  . Vitamin D deficiency 01/01/2014  . Medication management 01/01/2014  . Other abnormal glucose 01/01/2014  . Nonsustained ventricular tachycardia ([place]) 05/23/2013  . CAD (coronary artery disease) 01/08/2013  . Cardiomyopathy, ischemic 01/08/2013  . Hyperlipidemia 01/08/2013  . LBBB (left bundle branch block) 01/08/2013    Silvestre Mesi 03/10/2020, 3:33 PM  [facility] Physical Therapy [address] [place], [place], [address] Phone: [phone]   Fax:  [phone]  Name: Solace Wendorff. MRN: [id number] Date of Birth: [birth year]

## 2020-03-12 ENCOUNTER — Ambulatory Visit (INDEPENDENT_AMBULATORY_CARE_PROVIDER_SITE_OTHER): Payer: Medicare Other | Admitting: Physical Therapy

## 2020-03-12 ENCOUNTER — Other Ambulatory Visit: Payer: Self-pay

## 2020-03-12 DIAGNOSIS — M6281 Muscle weakness (generalized): Secondary | ICD-10-CM

## 2020-03-12 DIAGNOSIS — M79604 Pain in right leg: Secondary | ICD-10-CM

## 2020-03-12 DIAGNOSIS — R293 Abnormal posture: Secondary | ICD-10-CM

## 2020-03-12 DIAGNOSIS — Z9181 History of falling: Secondary | ICD-10-CM

## 2020-03-12 DIAGNOSIS — R2681 Unsteadiness on feet: Secondary | ICD-10-CM

## 2020-03-12 DIAGNOSIS — R296 Repeated falls: Secondary | ICD-10-CM | POA: Diagnosis not present

## 2020-03-12 DIAGNOSIS — R2689 Other abnormalities of gait and mobility: Secondary | ICD-10-CM

## 2020-03-12 DIAGNOSIS — R531 Weakness: Secondary | ICD-10-CM

## 2020-03-12 DIAGNOSIS — M79605 Pain in left leg: Secondary | ICD-10-CM | POA: Diagnosis not present

## 2020-03-12 NOTE — Therapy (Signed)
[facility] [address] [place], [place], [address] Phone: [phone]   Fax:  [phone]  Physical Therapy Treatment  Patient Details  Name: Paul Frazier. MRN: [id number] Date of Birth: 05/05/1933 Referring Provider (PT): Liane Comber, NP   Encounter Date: 03/12/2020   PT End of Session - 03/12/20 1534    Visit Number 19    Number of Visits 25    Date for PT Re-Evaluation 03/20/20    Authorization Type Medicare A & B and AARP    Progress Note Due on Visit 20    PT Start Time 1430    PT Stop Time 1515    PT Time Calculation (min) 45 min    Activity Tolerance Patient tolerated treatment well    Behavior During Therapy WFL for tasks assessed/performed           Past Medical History:  Diagnosis Date  . Arthritis   . CAD (coronary artery disease)   . Hyperlipidemia   . Hypertension   . Old myocardial infarct   . Right inguinal hernia 04/19/2012  . S/P CABG x 5 08/29/1984   LIMA to LAD,SVG to diagonal,SVG to OM,SVG to PDA and PLA  . Tremor, essential     Past Surgical History:  Procedure Laterality Date  . A-FLUTTER ABLATION N/A 03/09/2018   Procedure: A-FLUTTER ABLATION;  Surgeon: Evans Lance, MD;  Location: [place] [facility];  Service: Cardiovascular;  Laterality: N/A;  . CHOLECYSTECTOMY  10/1990  . CORONARY ARTERY BYPASS GRAFT  1986   x5 LIMA to LAD,SVG to diagonal,SVG to OM,SVG to PDA and PLA  . CORONARY STENT PLACEMENT    . INGUINAL HERNIA REPAIR  04/25/2012   right- laparoscopic  . PACEMAKER IMPLANT N/A 03/09/2018   Procedure: PACEMAKER IMPLANT;  Surgeon: Evans Lance, MD;  Location: [place] [facility];  Service: Cardiovascular;  Laterality: N/A;  . R/P Myoview  12/27/2008   no ischemia  . TEE WITHOUT CARDIOVERSION N/A 03/09/2018   Procedure: TRANSESOPHAGEAL ECHOCARDIOGRAM (TEE);  Surgeon: Fay Records, MD;  Location: [facility];  Service: Cardiovascular;  Laterality: N/A;  Bubble Study  . US ECHOCARDIOGRAPHY   12/27/2008   concentric LVH,mild to mod MR, TR and pulmonary hypertension. EF 45-50%    There were no vitals filed for this visit.   Subjective Assessment - 03/12/20 1526    Subjective relays his legs feel good and no pain, still wants to improve his balance    Pertinent History spinal stenosis/radiculopathy, chronic peripheral neuropathy, CHF, CAD, complete heart block, pacemaker, basal cell CA, atheroscelerosis on Aorta    Patient Stated Goals To improve balance to stop falls.    Pain Onset 1 to 4 weeks ago             [facility] Adult PT Treatment/Exercise - 03/12/20 0001      Neuro Re-ed    Neuro Re-ed Details  standing at door at arm lenght away with towel reaching up X 10 reps then up and over  X 10 reps each to facilitate anterior weight shift and plantar flexion recuritment. balance in bars for walking with head turns. Walking on toes in bars.  Rocker board A-P.  TKE from neutral hip, from hip in extension, and from hip in flexion on Lt side green band 20 reps ea      Exercises   Other Exercises  Nu step L6 X10 min.  Slantboard stretch 30 sec  X3. heel raises with UE support to  offload some due to weakness 2X10 reps. Leg press 125 lbs bilat push 3X10. Seated ankle PF 20 reps with blue band 20 reps X 2 sets, performed unilat on both sides .  Sit to stands no UE support from leg press chair 2X10                    PT Short Term Goals - 02/06/20 1554      PT SHORT TERM GOAL #1   Title Patient verbalizes & demonstrates understanding on updated HEP.  (All STGs Target Date: 02/29/2020)    Baseline NOT MET 02/04/2020    Time 1    Period Months    Status Revised    Target Date 02/29/20      PT SHORT TERM GOAL #2   Title Patient ambulates with rollator walker negotiating obstacles with supervision.    Time 1    Period Months    Status Revised    Target Date 02/29/20      PT SHORT TERM GOAL #3   Title Patient able to pick up items from floor and reaches 10" with locked  rollator walker support with supervision.    Time 1    Period Months    Status Revised    Target Date 02/29/20      PT SHORT TERM GOAL #4   Title Patient reports >20% improvement in pain with activiites.    Time 1    Period Months    Status On-going    Target Date 02/29/20      PT SHORT TERM GOAL #5   Title Patient negotiates ramps & curbs with rollator walker with supervision.    Time 4    Period Weeks    Status New    Target Date 02/29/20             PT Long Term Goals - 02/04/20 2115      PT LONG TERM GOAL #1   Title Patient verbalizes & demonstrates understanding of ongoing HEP / fitness plan.  (All LTGs Target Date: 03/21/2020)    Time 12    Period Weeks    Status On-going    Target Date 03/21/20      PT LONG TERM GOAL #2   Title Berg Balance Tasks > 45/56 with light touch on walker    Time 12    Period Weeks    Status Revised    Target Date 03/21/20      PT LONG TERM GOAL #3   Title Functional Gait Assessment >/= 19/30 with rollator walker indicating lower fall risk.    Time 12    Period Weeks    Status Revised    Target Date 03/21/20      PT LONG TERM GOAL #4   Title Patient verbalizes & demonstrates understanding how to decrease leg pain to improve standing & gait tolerance.    Time 12    Period Weeks    Status On-going    Target Date 03/21/20      PT LONG TERM GOAL #5   Title Patient ambulates 300' & negotiates ramps/curbs with rollator walker modified independent.    Time 12    Period Weeks    Status Revised    Target Date 03/21/20                 Plan - 03/12/20 1535    Clinical Impression Statement More time spent on balance today with focus on anterior weight  shift and plantar flexion strength. He is progressing with strength but continues to lack balance, anterior weight shift and PF strength.    Personal Factors and Comorbidities Age;Comorbidity 3+;Fitness;Past/Current Experience    Comorbidities spinal stenosis/radiculopathy,  chronic peripheral neuropathy, CHF, CAD, complete heart block, pacemaker, basal cell CA, atheroscelerosis on Aorta,    Examination-Activity Limitations Locomotion Level;Stairs;Stand;Transfers    Examination-Participation Restrictions Community Activity    Stability/Clinical Decision Making Stable/Uncomplicated    Rehab Potential Good    PT Frequency 2x / week    PT Duration 12 weeks    PT Treatment/Interventions ADLs/Self Care Home Management;Cryotherapy;Electrical Stimulation;Moist Heat;Ultrasound;DME Instruction;Gait training;Stair training;Functional mobility training;Therapeutic activities;Therapeutic exercise;Balance training;Neuromuscular re-education;Patient/family education;Manual techniques;Passive range of motion;Dry needling;Vestibular    PT Next Visit Plan needs progress note balance trying to facilitate anterior weight shift, leg strength, safe use of rollator.    PT Home Exercise Plan Access Code: [email or web address]    Consulted and Agree with Plan of Care Patient           Patient will benefit from skilled therapeutic intervention in order to improve the following deficits and impairments:  Abnormal gait, Decreased activity tolerance, Cardiopulmonary status limiting activity, Decreased balance, Decreased coordination, Decreased endurance, Decreased knowledge of use of DME, Decreased mobility, Decreased range of motion, Decreased strength, Impaired flexibility, Postural dysfunction, Pain  Visit Diagnosis: Other abnormalities of gait and mobility  Unsteadiness on feet  Abnormal posture  Muscle weakness (generalized)  Repeated falls  Pain in left leg  Pain in right leg  Weakness generalized  History of fall     Problem List Patient Active Problem List   Diagnosis Date Noted  . At risk for falls 12/04/2019  . Unsteady gait 12/04/2019  . Acquired thrombophilia ([place]) 07/23/2019  . Chronic combined systolic and diastolic heart failure ([place]) 08/29/2018  . Pacemaker  08/29/2018  . History of transient ischemic attack (TIA) 08/29/2018  . Lumbar pain 06/09/2018  . Senile purpura ([place]) 05/11/2018  . Complete heart block ([place])   . Paroxysmal atrial flutter ([place]) 03/06/2018  . Atherosclerosis of aorta ([place]) 08/23/2016  . History of basal cell cancer 08/01/2015  . Major depression in full remission ([place]) 09/11/2014  . Essential hypertension 01/01/2014  . Vitamin D deficiency 01/01/2014  . Medication management 01/01/2014  . Other abnormal glucose 01/01/2014  . Nonsustained ventricular tachycardia ([place]) 05/23/2013  . CAD (coronary artery disease) 01/08/2013  . Cardiomyopathy, ischemic 01/08/2013  . Hyperlipidemia 01/08/2013  . LBBB (left bundle branch block) 01/08/2013    Silvestre Mesi 03/12/2020, 3:38 PM  [facility] [address] [place], [place], [address] Phone: [phone]   Fax:  [phone]  Name: Mandel Seiden. MRN: [id number] Date of Birth: 1932/07/24

## 2020-03-13 DIAGNOSIS — I10 Essential (primary) hypertension: Secondary | ICD-10-CM | POA: Diagnosis not present

## 2020-03-13 LAB — BASIC METABOLIC PANEL
BUN/Creatinine Ratio: 12 (ref 10–24)
BUN: 13 mg/dL (ref 8–27)
CO2: 25 mmol/L (ref 20–29)
Calcium: 9.3 mg/dL (ref 8.6–10.2)
Chloride: 104 mmol/L (ref 96–106)
Creatinine, Ser: 1.08 mg/dL (ref 0.76–1.27)
GFR calc Af Amer: 72 mL/min/{1.73_m2} (ref >59)
GFR calc non Af Amer: 62 mL/min/{1.73_m2} (ref >59)
Glucose: 105 mg/dL — ABNORMAL HIGH (ref 65–99)
Potassium: 4.3 mmol/L (ref 3.5–5.2)
Sodium: 142 mmol/L (ref 134–144)

## 2020-03-14 ENCOUNTER — Encounter: Payer: Self-pay | Admitting: *Deleted

## 2020-03-16 ENCOUNTER — Other Ambulatory Visit: Payer: Self-pay | Admitting: Adult Health

## 2020-03-16 DIAGNOSIS — R609 Edema, unspecified: Secondary | ICD-10-CM

## 2020-03-16 DIAGNOSIS — I10 Essential (primary) hypertension: Secondary | ICD-10-CM

## 2020-03-17 ENCOUNTER — Other Ambulatory Visit: Payer: Self-pay

## 2020-03-17 ENCOUNTER — Encounter: Payer: Self-pay | Admitting: Physical Therapy

## 2020-03-17 ENCOUNTER — Other Ambulatory Visit: Payer: Self-pay | Admitting: Internal Medicine

## 2020-03-17 ENCOUNTER — Ambulatory Visit (INDEPENDENT_AMBULATORY_CARE_PROVIDER_SITE_OTHER): Payer: Medicare Other | Admitting: Physical Therapy

## 2020-03-17 DIAGNOSIS — R293 Abnormal posture: Secondary | ICD-10-CM

## 2020-03-17 DIAGNOSIS — R2689 Other abnormalities of gait and mobility: Secondary | ICD-10-CM | POA: Diagnosis not present

## 2020-03-17 DIAGNOSIS — R2681 Unsteadiness on feet: Secondary | ICD-10-CM | POA: Diagnosis not present

## 2020-03-17 DIAGNOSIS — M79605 Pain in left leg: Secondary | ICD-10-CM

## 2020-03-17 DIAGNOSIS — R296 Repeated falls: Secondary | ICD-10-CM

## 2020-03-17 DIAGNOSIS — G8929 Other chronic pain: Secondary | ICD-10-CM

## 2020-03-17 DIAGNOSIS — M79604 Pain in right leg: Secondary | ICD-10-CM

## 2020-03-17 DIAGNOSIS — M6281 Muscle weakness (generalized): Secondary | ICD-10-CM

## 2020-03-17 NOTE — Therapy (Signed)
[facility] [address] [place], [place], [address] Phone: [phone]   Fax:  [phone]  Physical Therapy Treatment & 10th Visit Progress Note  Patient Details  Name: Paul Frazier. MRN: [id number] Date of Birth: Jun 08, 1933 Referring Provider (PT): Liane Comber, NP   Encounter Date: 03/17/2020   Progress Note Reporting Period 02/11/2020 to 03/17/2020  See note below for Objective Data and Assessment of Progress/Goals.        PT End of Session - 03/17/20 1437    Visit Number 20    Number of Visits 25    Date for PT Re-Evaluation 03/20/20    Authorization Type Medicare A & B and AARP    Progress Note Due on Visit 20    PT Start Time 1432    PT Stop Time 1515    PT Time Calculation (min) 43 min    Equipment Utilized During Treatment Gait belt    Activity Tolerance Patient tolerated treatment well    Behavior During Therapy WFL for tasks assessed/performed           Past Medical History:  Diagnosis Date  . Arthritis   . CAD (coronary artery disease)   . Hyperlipidemia   . Hypertension   . Old myocardial infarct   . Right inguinal hernia 04/19/2012  . S/P CABG x 5 08/29/1984   LIMA to LAD,SVG to diagonal,SVG to OM,SVG to PDA and PLA  . Tremor, essential     Past Surgical History:  Procedure Laterality Date  . A-FLUTTER ABLATION N/A 03/09/2018   Procedure: A-FLUTTER ABLATION;  Surgeon: Evans Lance, MD;  Location: [place] [facility];  Service: Cardiovascular;  Laterality: N/A;  . CHOLECYSTECTOMY  10/1990  . CORONARY ARTERY BYPASS GRAFT  1986   x5 LIMA to LAD,SVG to diagonal,SVG to OM,SVG to PDA and PLA  . CORONARY STENT PLACEMENT    . INGUINAL HERNIA REPAIR  04/25/2012   right- laparoscopic  . PACEMAKER IMPLANT N/A 03/09/2018   Procedure: PACEMAKER IMPLANT;  Surgeon: Evans Lance, MD;  Location: [place] [facility];  Service: Cardiovascular;  Laterality: N/A;  . R/P Myoview  12/27/2008   no ischemia  . TEE WITHOUT  CARDIOVERSION N/A 03/09/2018   Procedure: TRANSESOPHAGEAL ECHOCARDIOGRAM (TEE);  Surgeon: Fay Records, MD;  Location: [facility] ENDOSCOPY;  Service: Cardiovascular;  Laterality: N/A;  Bubble Study  . US ECHOCARDIOGRAPHY  12/27/2008   concentric LVH,mild to mod MR, TR and pulmonary hypertension. EF 45-50%    There were no vitals filed for this visit.   Subjective Assessment - 03/17/20 1432    Subjective No falls. A nurse did home visit and strongly recommended that he use his walker in the house like the PT has recommended.    Pertinent History spinal stenosis/radiculopathy, chronic peripheral neuropathy, CHF, CAD, complete heart block, pacemaker, basal cell CA, atheroscelerosis on Aorta    Patient Stated Goals To improve balance to stop falls.    Currently in Pain? No/denies    Pain Onset 1 to 4 weeks ago              [facility] PT Assessment - 03/17/20 1432      Assessment   Medical Diagnosis Unsteady Gait    Referring Provider (PT) Liane Comber, NP      Berg Balance Test   Sit to Stand Able to stand without using hands and stabilize independently    Standing Unsupported Able to stand safely 2 minutes    Sitting with  Back Unsupported but Feet Supported on Floor or Stool Able to sit safely and securely 2 minutes    Stand to Sit Sits safely with minimal use of hands    Transfers Able to transfer safely, minor use of hands    Standing Unsupported with Eyes Closed Able to stand 10 seconds with supervision   light touch on rollator = 4   Standing Unsupported with Feet Together Able to place feet together independently and stand for 1 minute with supervision   light touch on rollator = 4   From Standing, Reach Forward with Outstretched Arm Can reach forward >12 cm safely (5")   light touch on rollator = 4   From Standing Position, Pick up Object from Floor Able to pick up shoe, needs supervision   light touch on rollator = 4   From Standing Position, Turn to Look Behind Over each Shoulder Turn  sideways only but maintains balance   light touch on rollator = 3   Turn 360 Degrees Needs close supervision or verbal cueing   light touch on rollator = 2   Standing Unsupported, Alternately Place Feet on Step/Stool Able to complete >2 steps/needs minimal assist   light touch on rollator = 2   Standing Unsupported, One Foot in Front Needs help to step but can hold 15 seconds   light touch on rollator = 2   Standing on One Leg Unable to try or needs assist to prevent fall   light touch on rollator = 2   Total Score 37    Berg comment: Initial Berg 22/56 without support;   03/17/2020 Berg tasks with light rollator walker support 47/56      Functional Gait  Assessment   Gait assessed  Yes   with rollator walker   Gait Level Surface Walks 20 ft in less than 7 sec but greater than 5.5 sec, uses assistive device, slower speed, mild gait deviations, or deviates 6-10 in outside of the 12 in walkway width.    Change in Gait Speed Able to change speed, demonstrates mild gait deviations, deviates 6-10 in outside of the 12 in walkway width, or no gait deviations, unable to achieve a major change in velocity, or uses a change in velocity, or uses an assistive device.    Gait with Horizontal Head Turns Performs head turns smoothly with slight change in gait velocity (eg, minor disruption to smooth gait path), deviates 6-10 in outside 12 in walkway width, or uses an assistive device.    Gait with Vertical Head Turns Performs task with slight change in gait velocity (eg, minor disruption to smooth gait path), deviates 6 - 10 in outside 12 in walkway width or uses assistive device    Gait and Pivot Turn Pivot turns safely in greater than 3 sec and stops with no loss of balance, or pivot turns safely within 3 sec and stops with mild imbalance, requires small steps to catch balance.    Step Over Obstacle Is able to step over one shoe box (4.5 in total height) but must slow down and adjust steps to clear box safely. May  require verbal cueing.    Gait with Narrow Base of Support Ambulates 7-9 steps.    Gait with Eyes Closed Walks 20 ft, uses assistive device, slower speed, mild gait deviations, deviates 6-10 in outside 12 in walkway width. Ambulates 20 ft in less than 9 sec but greater than 7 sec.    Ambulating Backwards Walks 20 ft, uses  assistive device, slower speed, mild gait deviations, deviates 6-10 in outside 12 in walkway width.    Steps Alternating feet, must use rail.    Total Score 19    FGA comment: with rollator support                         [facility] Adult PT Treatment/Exercise - 03/17/20 1432      Transfers   Transfers Sit to Stand;Stand to Sit      Ambulation/Gait   Ambulation/Gait Yes    Stairs Yes    Stairs Assistance 6: Modified independent (Device/Increase time)    Stair Management Technique One rail Right;Alternating pattern;Forwards    Number of Stairs 11    Height of Stairs 6    Door Management 5: Supervision   rollator walker   Door Managment Details (indicate cue type and reason) requires cues for safe technique    Ramp 5: Supervision   rollator walker   Ramp Details (indicate cue type and reason) requires cues for safe technique      Neuro Re-ed    Neuro Re-ed Details  --      Exercises   Other Exercises  --                    PT Short Term Goals - 02/06/20 1554      PT SHORT TERM GOAL #1   Title Patient verbalizes & demonstrates understanding on updated HEP.  (All STGs Target Date: 02/29/2020)    Baseline NOT MET 02/04/2020    Time 1    Period Months    Status Revised    Target Date 02/29/20      PT SHORT TERM GOAL #2   Title Patient ambulates with rollator walker negotiating obstacles with supervision.    Time 1    Period Months    Status Revised    Target Date 02/29/20      PT SHORT TERM GOAL #3   Title Patient able to pick up items from floor and reaches 10" with locked rollator walker support with supervision.    Time 1     Period Months    Status Revised    Target Date 02/29/20      PT SHORT TERM GOAL #4   Title Patient reports >20% improvement in pain with activiites.    Time 1    Period Months    Status On-going    Target Date 02/29/20      PT SHORT TERM GOAL #5   Title Patient negotiates ramps & curbs with rollator walker with supervision.    Time 4    Period Weeks    Status New    Target Date 02/29/20             PT Long Term Goals - 03/17/20 1623      PT LONG TERM GOAL #1   Title Patient verbalizes & demonstrates understanding of ongoing HEP / fitness plan.  (All LTGs Target Date: 03/21/2020)    Time 12    Period Weeks    Status On-going    Target Date 03/21/20      PT LONG TERM GOAL #2   Title Berg Balance Tasks > 45/56 with light touch on walker    Baseline MET 03/17/2020 Berg Balance Tasks with light touch on locked rollator walker 47/56.   Standard Berg improved from 22/56 to 37/56 which still indicates fall risk but significantly improved.  Time 12    Period Weeks    Status Achieved      PT LONG TERM GOAL #3   Title Functional Gait Assessment >/= 19/30 with rollator walker indicating lower fall risk.    Baseline FGA with rollator walker 19/30    Time 12    Period Weeks    Status Achieved      PT LONG TERM GOAL #4   Title Patient verbalizes & demonstrates understanding how to decrease leg pain to improve standing & gait tolerance.    Time 12    Period Weeks    Status On-going    Target Date 03/21/20      PT LONG TERM GOAL #5   Title Patient ambulates 300' & negotiates ramps/curbs with rollator walker modified independent.    Time 12    Period Weeks    Status Revised    Target Date 03/21/20                 Plan - 03/17/20 1437    Clinical Impression Statement Patient improved Berg Balance standard method from 22/56 to 37/56 which still indicates high fall risk but significantly improved. The task of Berg with rollator suppport of 47/56 indicates lower fall  risk with walker support.  Functional Gait Assessment with rollator walker support 19/30 indicates lower fall risk.    Personal Factors and Comorbidities Age;Comorbidity 3+;Fitness;Past/Current Experience    Comorbidities spinal stenosis/radiculopathy, chronic peripheral neuropathy, CHF, CAD, complete heart block, pacemaker, basal cell CA, atheroscelerosis on Aorta,    Examination-Activity Limitations Locomotion Level;Stairs;Stand;Transfers    Examination-Participation Restrictions Community Activity    Stability/Clinical Decision Making Stable/Uncomplicated    Rehab Potential Good    PT Frequency 2x / week    PT Duration 12 weeks    PT Treatment/Interventions ADLs/Self Care Home Management;Cryotherapy;Electrical Stimulation;Moist Heat;Ultrasound;DME Instruction;Gait training;Stair training;Functional mobility training;Therapeutic activities;Therapeutic exercise;Balance training;Neuromuscular re-education;Patient/family education;Manual techniques;Passive range of motion;Dry needling;Vestibular    PT Next Visit Plan check remaining LTGs and discharge    PT Home Exercise Plan Access Code: [email or web address]    Consulted and Agree with Plan of Care Patient           Patient will benefit from skilled therapeutic intervention in order to improve the following deficits and impairments:  Abnormal gait, Decreased activity tolerance, Cardiopulmonary status limiting activity, Decreased balance, Decreased coordination, Decreased endurance, Decreased knowledge of use of DME, Decreased mobility, Decreased range of motion, Decreased strength, Impaired flexibility, Postural dysfunction, Pain  Visit Diagnosis: Other abnormalities of gait and mobility  Unsteadiness on feet  Abnormal posture  Muscle weakness (generalized)  Repeated falls  Pain in left leg  Pain in right leg     Problem List Patient Active Problem List   Diagnosis Date Noted  . At risk for falls 12/04/2019  . Unsteady gait 12/04/2019    . Acquired thrombophilia ([place]) 07/23/2019  . Chronic combined systolic and diastolic heart failure ([place]) 08/29/2018  . Pacemaker 08/29/2018  . History of transient ischemic attack (TIA) 08/29/2018  . Lumbar pain 06/09/2018  . Senile purpura ([place]) 05/11/2018  . Complete heart block ([place])   . Paroxysmal atrial flutter ([place]) 03/06/2018  . Atherosclerosis of aorta ([place]) 08/23/2016  . History of basal cell cancer 08/01/2015  . Major depression in full remission ([place]) 09/11/2014  . Essential hypertension 01/01/2014  . Vitamin D deficiency 01/01/2014  . Medication management 01/01/2014  . Other abnormal glucose 01/01/2014  . Nonsustained ventricular tachycardia ([place]) 05/23/2013  . CAD (coronary artery disease)  01/08/2013  . Cardiomyopathy, ischemic 01/08/2013  . Hyperlipidemia 01/08/2013  . LBBB (left bundle branch block) 01/08/2013    Jamey Reas PT, DPT 03/17/2020, 4:30 PM  [facility] [address] [place], [place], [address] Phone: [phone]   Fax:  [phone]  Name: Paul Frazier. MRN: [id number] Date of Birth: 10-04-1932

## 2020-03-19 ENCOUNTER — Other Ambulatory Visit: Payer: Self-pay

## 2020-03-19 ENCOUNTER — Ambulatory Visit (INDEPENDENT_AMBULATORY_CARE_PROVIDER_SITE_OTHER): Payer: Medicare Other | Admitting: *Deleted

## 2020-03-19 ENCOUNTER — Ambulatory Visit (INDEPENDENT_AMBULATORY_CARE_PROVIDER_SITE_OTHER): Payer: Medicare Other | Admitting: Physical Therapy

## 2020-03-19 DIAGNOSIS — M6281 Muscle weakness (generalized): Secondary | ICD-10-CM

## 2020-03-19 DIAGNOSIS — R2689 Other abnormalities of gait and mobility: Secondary | ICD-10-CM

## 2020-03-19 DIAGNOSIS — M79605 Pain in left leg: Secondary | ICD-10-CM

## 2020-03-19 DIAGNOSIS — R293 Abnormal posture: Secondary | ICD-10-CM | POA: Diagnosis not present

## 2020-03-19 DIAGNOSIS — I442 Atrioventricular block, complete: Secondary | ICD-10-CM | POA: Diagnosis not present

## 2020-03-19 DIAGNOSIS — M79604 Pain in right leg: Secondary | ICD-10-CM | POA: Diagnosis not present

## 2020-03-19 DIAGNOSIS — R296 Repeated falls: Secondary | ICD-10-CM | POA: Diagnosis not present

## 2020-03-19 DIAGNOSIS — R531 Weakness: Secondary | ICD-10-CM

## 2020-03-19 DIAGNOSIS — R2681 Unsteadiness on feet: Secondary | ICD-10-CM

## 2020-03-19 NOTE — Therapy (Signed)
[facility] Physical Therapy [address] [place], [place], [address] Phone: [phone]   Fax:  [phone]  Physical Therapy Treatment/Discharge PHYSICAL THERAPY DISCHARGE SUMMARY  Visits from Start of Care: 21  Current functional level related to goals / functional outcomes: See below   Remaining deficits: balance   Education / Equipment: HEP Plan: Patient agrees to discharge.  Patient goals were met. Patient is being discharged due to                                                     ?????  PT feels he is at his maximal functional level    Patient Details  Name: Paul Frazier. MRN: [id number] Date of Birth: [birth year] Referring Provider (PT): Liane Comber, NP   Encounter Date: 03/19/2020   PT End of Session - 03/19/20 1503    Visit Number 21    Number of Visits 25    Date for PT Re-Evaluation 03/20/20    Authorization Type Medicare A & B and [employer]    Progress Note Due on Visit 20    PT Start Time 1430    PT Stop Time 1504    PT Time Calculation (min) 34 min    Equipment Utilized During Treatment Gait belt    Activity Tolerance Patient tolerated treatment well    Behavior During Therapy WFL for tasks assessed/performed           Past Medical History:  Diagnosis Date  . Arthritis   . CAD (coronary artery disease)   . Hyperlipidemia   . Hypertension   . Old myocardial infarct   . Right inguinal hernia 04/19/2012  . S/P CABG x 5 08/29/1984   LIMA to LAD,SVG to diagonal,SVG to OM,SVG to PDA and PLA  . Tremor, essential     Past Surgical History:  Procedure Laterality Date  . A-FLUTTER ABLATION N/A 03/09/2018   Procedure: A-FLUTTER ABLATION;  Surgeon: Evans Lance, MD;  Location: [facility] [facility];  Service: Cardiovascular;  Laterality: N/A;  . CHOLECYSTECTOMY  10/1990  . CORONARY ARTERY BYPASS GRAFT  1986   x5 LIMA to LAD,SVG to diagonal,SVG to OM,SVG to PDA and PLA  . CORONARY STENT PLACEMENT    . INGUINAL HERNIA REPAIR   04/25/2012   right- laparoscopic  . PACEMAKER IMPLANT N/A 03/09/2018   Procedure: PACEMAKER IMPLANT;  Surgeon: Evans Lance, MD;  Location: [place] [facility];  Service: Cardiovascular;  Laterality: N/A;  . R/P Myoview  12/27/2008   no ischemia  . TEE WITHOUT CARDIOVERSION N/A 03/09/2018   Procedure: TRANSESOPHAGEAL ECHOCARDIOGRAM (TEE);  Surgeon: Fay Records, MD;  [facility];  Service: Cardiovascular;  Laterality: N/A;  Bubble Study  . US ECHOCARDIOGRAPHY  12/27/2008   concentric LVH,mild to mod MR, TR and pulmonary hypertension. EF 45-50%    There were no vitals filed for this visit.   Subjective Assessment - 03/19/20 1502    Subjective No falls using rollator, he is concerned with how bad his balance is without it, so was encouraged to always use it to prevent falls.    Pertinent History spinal stenosis/radiculopathy, chronic peripheral neuropathy, CHF, CAD, complete heart block, pacemaker, basal cell CA, atheroscelerosis on Aorta    Patient Stated Goals To improve balance to stop falls.    Pain Onset 1 to 4  weeks ago                                     PT Education - 03/19/20 1503    Education Details final HEP review    Person(s) Educated Patient    Methods Explanation;Demonstration;Verbal cues;Handout    Comprehension Verbalized understanding;Returned demonstration            PT Short Term Goals - 03/19/20 1506      PT SHORT TERM GOAL #1   Title Patient verbalizes & demonstrates understanding on updated HEP.  (All STGs Target Date: 02/29/2020)    Baseline verbalized understanding today    Time 1    Period Months    Status Achieved    Target Date 02/29/20      PT SHORT TERM GOAL #2   Title Patient ambulates with rollator walker negotiating obstacles with supervision.    Time 1    Period Months    Status Achieved    Target Date 02/29/20      PT SHORT TERM GOAL #3   Title Patient able to pick up items from floor and reaches  10" with locked rollator walker support with supervision.    Time 1    Period Months    Status Achieved    Target Date 02/29/20      PT SHORT TERM GOAL #4   Title Patient reports >20% improvement in pain with activiites.    Time 1    Period Months    Status Achieved    Target Date 02/29/20      PT SHORT TERM GOAL #5   Title Patient negotiates ramps & curbs with rollator walker with supervision.    Time 4    Period Weeks    Status Achieved    Target Date 02/29/20             PT Long Term Goals - 03/19/20 1507      PT LONG TERM GOAL #1   Title Patient verbalizes & demonstrates understanding of ongoing HEP / fitness plan.  (All LTGs Target Date: 03/21/2020)    Time 12    Period Weeks    Status Achieved      PT LONG TERM GOAL #2   Title Berg Balance Tasks > 45/56 with light touch on walker    Baseline MET 03/17/2020 Berg Balance Tasks with light touch on locked rollator walker 47/56.   Standard Berg improved from 22/56 to 37/56 which still indicates fall risk but significantly improved.    Time 12    Period Weeks    Status Achieved      PT LONG TERM GOAL #3   Title Functional Gait Assessment >/= 19/30 with rollator walker indicating lower fall risk.    Baseline FGA with rollator walker 19/30    Time 12    Period Weeks    Status Achieved      PT LONG TERM GOAL #4   Title Patient verbalizes & demonstrates understanding how to decrease leg pain to improve standing & gait tolerance.    Time 12    Period Weeks    Status Achieved      PT LONG TERM GOAL #5   Title Patient ambulates 300' & negotiates ramps/curbs with rollator walker modified independent.    Time 12    Period Weeks    Status Achieved  Plan - 03/19/20 1508    Clinical Impression Statement He is at the end of POC date and overall has made progress in leg strength and endurance. Overall his balance continues to have deficits paticularly with LOB posteriorly and PT has not been able to  change this with balance training. He is however now using rollator for ambulation and standing activity and he has good balance with this. PT will discharge as I feel he has met his maximal functional level with PT. His HEP was revised and progressed and he was encouraged to keep working on his balance at home with HEP.    Personal Factors and Comorbidities Age;Comorbidity 3+;Fitness;Past/Current Experience    Comorbidities spinal stenosis/radiculopathy, chronic peripheral neuropathy, CHF, CAD, complete heart block, pacemaker, basal cell CA, atheroscelerosis on Aorta,    Examination-Activity Limitations Locomotion Level;Stairs;Stand;Transfers    Examination-Participation Restrictions Community Activity    Stability/Clinical Decision Making Stable/Uncomplicated    Rehab Potential Good    PT Frequency 2x / week    PT Duration 12 weeks    PT Treatment/Interventions ADLs/Self Care Home Management;Cryotherapy;Electrical Stimulation;Moist Heat;Ultrasound;DME Instruction;Gait training;Stair training;Functional mobility training;Therapeutic activities;Therapeutic exercise;Balance training;Neuromuscular re-education;Patient/family education;Manual techniques;Passive range of motion;Dry needling;Vestibular    PT Next Visit Plan DC today    PT Home Exercise Plan Access Code: [email or web address]    Consulted and Agree with Plan of Care Patient           Patient will benefit from skilled therapeutic intervention in order to improve the following deficits and impairments:  Abnormal gait, Decreased activity tolerance, Cardiopulmonary status limiting activity, Decreased balance, Decreased coordination, Decreased endurance, Decreased knowledge of use of DME, Decreased mobility, Decreased range of motion, Decreased strength, Impaired flexibility, Postural dysfunction, Pain  Visit Diagnosis: Other abnormalities of gait and mobility  Unsteadiness on feet  Abnormal posture  Muscle weakness (generalized)  Repeated  falls  Pain in left leg  Pain in right leg  Weakness generalized     Problem List Patient Active Problem List   Diagnosis Date Noted  . At risk for falls 12/04/2019  . Unsteady gait 12/04/2019  . Acquired thrombophilia ([place]) 07/23/2019  . Chronic combined systolic and diastolic heart failure ([place]) 08/29/2018  . Pacemaker 08/29/2018  . History of transient ischemic attack (TIA) 08/29/2018  . Lumbar pain 06/09/2018  . Senile purpura ([place]) 05/11/2018  . Complete heart block ([place])   . Paroxysmal atrial flutter ([place]) 03/06/2018  . Atherosclerosis of aorta ([place]) 08/23/2016  . History of basal cell cancer 08/01/2015  . Major depression in full remission ([place]) 09/11/2014  . Essential hypertension 01/01/2014  . Vitamin D deficiency 01/01/2014  . Medication management 01/01/2014  . Other abnormal glucose 01/01/2014  . Nonsustained ventricular tachycardia ([place]) 05/23/2013  . CAD (coronary artery disease) 01/08/2013  . Cardiomyopathy, ischemic 01/08/2013  . Hyperlipidemia 01/08/2013  . LBBB (left bundle branch block) 01/08/2013    Silvestre Mesi 03/19/2020, 3:11 PM  [facility] Physical Therapy [address] [place], [place], [address] Phone: [phone]   Fax:  [phone]  Name: Codey Burling. MRN: [id number] Date of Birth: 1932/08/05

## 2020-03-19 NOTE — Patient Instructions (Signed)
Access Code: [email or web address] URL: [email or web address] Date: 03/19/2020 Prepared by: Elsie Ra  Exercises Seated Hamstring Stretch - 2 x daily - 7 x weekly - 1 sets - 1-2 reps - 20 seconds hold Sit to Stand - 1 x daily - 7 x weekly - 1 sets - 10 reps - 5 seconds hold Narrow stance head motions - 1 x daily - 5 x weekly - 1 sets - 5-10 reps - 1-2 seconds hold Wide Stance with Eyes Closed with Head Motions - 1 x daily - 5 x weekly - 4 sets - 5-10 reps - 1-2 hold Seated Ankle Plantarflexion with Resistance - 2 x daily - 6 x weekly - 2 sets - 20 reps Toe Walking with Counter Support - 2 x daily - 6 x weekly - 1 sets - 3-5 reps Side Stepping with Counter Support - 2 x daily - 6 x weekly - 1 sets - 3-5 reps

## 2020-03-20 LAB — CUP PACEART REMOTE DEVICE CHECK
Battery Remaining Longevity: 80 mo
Battery Voltage: 2.98 V
Brady Statistic AP VP Percent: 93.43 %
Brady Statistic AP VS Percent: 0.12 %
Brady Statistic AS VP Percent: 5.36 %
Brady Statistic AS VS Percent: 1.1 %
Brady Statistic RA Percent Paced: 94.5 %
Brady Statistic RV Percent Paced: 98.78 %
Date Time Interrogation Session: 20210901052923
Implantable Lead Implant Date: 20190822
Implantable Lead Implant Date: 20190822
Implantable Lead Location: 753859
Implantable Lead Location: 753860
Implantable Lead Model: 3830
Implantable Lead Model: 5076
Implantable Pulse Generator Implant Date: 20190822
Lead Channel Impedance Value: 323 Ohm
Lead Channel Impedance Value: 361 Ohm
Lead Channel Impedance Value: 418 Ohm
Lead Channel Impedance Value: 475 Ohm
Lead Channel Pacing Threshold Amplitude: 0.625 V
Lead Channel Pacing Threshold Amplitude: 1.125 V
Lead Channel Pacing Threshold Pulse Width: 0.4 ms
Lead Channel Pacing Threshold Pulse Width: 0.4 ms
Lead Channel Sensing Intrinsic Amplitude: 5.625 mV
Lead Channel Sensing Intrinsic Amplitude: 5.625 mV
Lead Channel Sensing Intrinsic Amplitude: 8.375 mV
Lead Channel Sensing Intrinsic Amplitude: 9.125 mV
Lead Channel Setting Pacing Amplitude: 1.5 V
Lead Channel Setting Pacing Amplitude: 2.5 V
Lead Channel Setting Pacing Pulse Width: 1 ms
Lead Channel Setting Sensing Sensitivity: 2.8 mV

## 2020-03-21 NOTE — Progress Notes (Signed)
Remote pacemaker transmission.   

## 2020-03-25 ENCOUNTER — Other Ambulatory Visit: Payer: Self-pay

## 2020-03-25 ENCOUNTER — Encounter: Payer: Self-pay | Admitting: Cardiovascular Disease

## 2020-03-25 ENCOUNTER — Ambulatory Visit (INDEPENDENT_AMBULATORY_CARE_PROVIDER_SITE_OTHER): Payer: Medicare Other | Admitting: Cardiovascular Disease

## 2020-03-25 VITALS — BP 156/65 | HR 85 | Ht 60.5 in | Wt 178.4 lb

## 2020-03-25 DIAGNOSIS — I480 Paroxysmal atrial fibrillation: Secondary | ICD-10-CM | POA: Diagnosis not present

## 2020-03-25 DIAGNOSIS — E7800 Pure hypercholesterolemia, unspecified: Secondary | ICD-10-CM

## 2020-03-25 DIAGNOSIS — I25118 Atherosclerotic heart disease of native coronary artery with other forms of angina pectoris: Secondary | ICD-10-CM

## 2020-03-25 DIAGNOSIS — I6501 Occlusion and stenosis of right vertebral artery: Secondary | ICD-10-CM | POA: Diagnosis not present

## 2020-03-25 DIAGNOSIS — I255 Ischemic cardiomyopathy: Secondary | ICD-10-CM | POA: Diagnosis not present

## 2020-03-25 DIAGNOSIS — I10 Essential (primary) hypertension: Secondary | ICD-10-CM

## 2020-03-25 DIAGNOSIS — I441 Atrioventricular block, second degree: Secondary | ICD-10-CM | POA: Diagnosis not present

## 2020-03-25 DIAGNOSIS — Z950 Presence of cardiac pacemaker: Secondary | ICD-10-CM | POA: Diagnosis not present

## 2020-03-25 DIAGNOSIS — I5042 Chronic combined systolic (congestive) and diastolic (congestive) heart failure: Secondary | ICD-10-CM | POA: Diagnosis not present

## 2020-03-25 MED ORDER — POTASSIUM CHLORIDE CRYS ER 10 MEQ PO TBCR: 10.0000 meq | EXTENDED_RELEASE_TABLET | Freq: Two times a day (BID) | ORAL | 3 refills | Status: DC

## 2020-03-25 NOTE — Patient Instructions (Signed)
Medication Instructions:  DECREASE the Potassium to 10 mEq twice daily *If you need a refill on your cardiac medications before your next appointment, please call your pharmacy*   Lab Work: None ordered If you have labs (blood work) drawn today and your tests are completely normal, you will receive your results only by: [name] MyChart Message (if you have MyChart) OR . A paper copy in the mail If you have any lab test that is abnormal or we need to change your treatment, we will call you to review the results.   Testing/Procedures: None ordered   Follow-Up: At [facility], you and your health needs are our priority.  As part of our continuing mission to provide you with exceptional heart care, we have created designated Provider Care Teams.  These Care Teams include your primary Cardiologist (physician) and Advanced Practice Providers (APPs -  Physician Assistants and Nurse Practitioners) who all work together to provide you with the care you need, when you need it.  We recommend signing up for the patient portal called "MyChart".  Sign up information is provided on this After Visit Summary.  MyChart is used to connect with patients for Virtual Visits (Telemedicine).  Patients are able to view lab/test results, encounter notes, upcoming appointments, etc.  Non-urgent messages can be sent to your provider as well.   To learn more about what you can do with MyChart, go to [email or web address].    Your next appointment:   6 month(s)  The format for your next appointment:   In Person  Provider:   You may see Sanda Klein, MD or one of the following Advanced Practice Providers on your designated Care Team:    Almyra Deforest, PA-C  Fabian Sharp, PA-C or   Roby Lofts, [place]

## 2020-03-25 NOTE — Progress Notes (Signed)
Patient ID: Paul Nap., male   DOB: 1933-01-26, 86 y.o.   MRN: [id number]      Cardiology Office Note   Date:  03/30/2020   ID:  Paul Nap., DOB Mar 21, 1933, MRN [id number]  PCP:  Unk Pinto, MD  Cardiologist:   Sanda Klein, MD   Chief Complaint  Patient presents with  . Coronary Artery Disease  Pacemaker check, atrial flutter ablation, falls    History of Present Illness: Paul Goodwyn. is a 86 y.o. male who presents for follow-up on multiple cardiac problems.  His wife Paul Frazier is also my patient, but has been unable to follow-up in a long time.  She had a stroke and has been a resident at [facility] for the last several months.  Paul Frazier.  He has not had any further falls.  He is using his walker.  He has occasional problems with chest tightness when he wakes up in the morning, never lasting more than about 10 minutes.  He is able to perform his daily activities without limitations from chest pressure or dyspnea.  He denies palpitations and dizziness and has not had syncope.  His legs are still unsteady.    At his last appointment he was rather hypotensive and we discontinued his hydrochlorothiazide, switched to furosemide for edema control.  Frazier he does not have any lower extremity edema.  He has not had orthopnea or PND.  As expected, his systolic blood pressure is a little higher.  He has a dual-chamber pacemaker and his right ventricular lead is a His bundle/septal location.  His current generator was implanted in 2019 and has approximately 7 years of remaining longevity.  Lead parameters are good.  He has 81% atrial pacing and 99% ventricular pacing.  Underlying rhythm is atrial sensed, ventricular sensed (probably sinus bradycardia?) at about 40 bpm, with a long AV delay.  He has had infrequent episodes of atrial fibrillation, overall burden less than 0.1% (2 hours in March, 31 minutes on July 9).  He has not had any problems  with rapid ventricular rates during atrial fibrillation, but has had very rare episodes of nonsustained VT (longest 6 beats on July 17).  Activity level remains fairly good at about 2 hours/day.  He has a history of coronary artery disease and previous bypass surgery with subsequent graft failure and myocardial infarction, moderate to severely depressed left ventricular systolic function with EF around 30% and well compensated heart failure, nonsustained ventricular tachycardia, atrial flutter status post radiofrequency ablation, paroxysmal atrial fibrillation, left bundle branch block, second-degree atrioventricular block, pacemaker with septal His bundle ventricular lead (March 09, 2018, Dr. Lovena Le, Medtr[employer] Azure), history of subdural hematoma in 2013.  He had atrial flutter ablation, followed by development of complete heart block.  He underwent implantation of a dual-chamber permanent pacemaker with a His bundle septal ventricular lead on March 09, 2018.   Ventricular outputs were reduced to chronic settings Frazier.  His estimated generator longevity after these changes is now 8.8 years.  Prior to his RF ablation he underwent a transesophageal echo on August 21 which showed left ventricular ejection fraction of 30-35% with inferior and inferoseptal akinesis and the mildly dilated left atrium, mild mitral regurgitation, moderate tricuspid regurgitation.  A nuclear stress test performed on April 12, 2018 shows a similar pattern of LVEF 30% with inferior scar, incomplete reversible ischemia.  He has a complicated history of coronary disease and underwent bypass surgery in  1986. He has total occlusion of his LAD artery and his dominant left circumflex coronary artery is graft dependent. He presented with a moderate size non-ST segment elevation myocardial infarction in the setting of paroxysmal supraventricular tachycardia in October 2012 and was treated in [place], [place]. The  catheterization report is a little confusing since it describes grafts with different locations than those described at the time of his bypass procedure, however it appears that he has lost the sequential bypass to the oblique marginal artery. A more recent nuclear stress test (May 2014) showed that his left ventricular ejection fraction was down to 42% with inferolateral and anterolateral scar and hypokinesia but without any reversible ischemic abnormalities. I offered cardiac catheterization at that time since I was worried that he had developed disease in the sequential saphenous vein graft to the PDA/PLA, but he preferred conservative management.  A nuclear stress test in September 2019 shows basal-mid inferior, mostly fixed defect, EF 30%.  He underwent radiofrequency ablation for atrial flutter in August 9485, complicated by complete heart block and leading to implantation of a dual-chamber permanent pacemaker with a His bundle lead ([employer]tronic Azure).  He had long-standing left bundle branch block before that.   Past Medical History:  Diagnosis Date  . Arthritis   . CAD (coronary artery disease)   . Hyperlipidemia   . Hypertension   . Old myocardial infarct   . Right inguinal hernia 04/19/2012  . S/P CABG x 5 08/29/1984   LIMA to LAD,SVG to diagonal,SVG to OM,SVG to PDA and PLA  . Tremor, essential     Past Surgical History:  Procedure Laterality Date  . A-FLUTTER ABLATION N/A 03/09/2018   Procedure: A-FLUTTER ABLATION;  Surgeon: Evans Lance, MD;  Location: [facility];  Service: Cardiovascular;  Laterality: N/A;  . CHOLECYSTECTOMY  10/1990  . CORONARY ARTERY BYPASS GRAFT  1986   x5 LIMA to LAD,SVG to diagonal,SVG to OM,SVG to PDA and PLA  . CORONARY STENT PLACEMENT    . INGUINAL HERNIA REPAIR  04/25/2012   right- laparoscopic  . PACEMAKER IMPLANT N/A 03/09/2018   Procedure: PACEMAKER IMPLANT;  Surgeon: Evans Lance, MD;  Location: [facility];  Service:  Cardiovascular;  Laterality: N/A;  . R/P Myoview  12/27/2008   no ischemia  . TEE WITHOUT CARDIOVERSION N/A 03/09/2018   Procedure: TRANSESOPHAGEAL ECHOCARDIOGRAM (TEE);  Surgeon: Fay Records, MD;  Location: [facility];  Service: Cardiovascular;  Laterality: N/A;  Bubble Study  . US ECHOCARDIOGRAPHY  12/27/2008   concentric LVH,mild to mod MR, TR and pulmonary hypertension. EF 45-50%     Current Outpatient Medications  Medication Sig Dispense Refill  . acetaminophen (TYLENOL) 325 MG tablet Take 650 mg by mouth every 6 (six) hours as needed.    [name] apixaban (ELIQUIS) 5 MG TABS tablet Take 1 tablet (5 mg total) by mouth 2 (two) times daily. 180 tablet 1  . atorvastatin (LIPITOR) 40 MG tablet Take 1 tablet daily for Cholesterol 90 tablet 3  . buPROPion (WELLBUTRIN XL) 300 MG 24 hr tablet Take 1 tablet daily for Mood 90 tablet 3  . citalopram (CELEXA) 40 MG tablet Take 1 tablet (40 mg total) by mouth daily. 90 tablet 2  . co-enzyme Q-10 30 MG capsule Take 30 mg by mouth daily.     . diazepam (VALIUM) 5 MG tablet Take 1/2 to 1 tablet 2 to 3 x / day only if needed for Muscle spasms  & please try to  limit to 5 days /week to avoid addiction 90 tablet 0  . Digestive Enzymes CAPS Take 1-2 capsules by mouth as needed (to aid digestive function).     . furosemide (LASIX) 40 MG tablet Take 1 tablet (40 mg total) by mouth daily. 30 tablet 1  . gabapentin (NEURONTIN) 300 MG capsule TAKE 1 CAPSULE BY MOUTH 3  TIMES DAILY AS NEEDED FOR  CHRONIC PAIN 270 capsule 3  . losartan (COZAAR) 25 MG tablet Take 0.5 tablets (12.5 mg total) by mouth daily. 45 tablet 3  . Magnesium Chloride (MAGNESIUM DR PO) Take 2 tablets by mouth daily.     . metoprolol succinate (TOPROL-XL) 25 MG 24 hr tablet Take 1 tablet Daily for BP & Heart 90 tablet 3  . nitroGLYCERIN (NITROSTAT) 0.4 MG SL tablet Place 1 tablet (0.4 mg total) under the tongue every 5 (five) minutes as needed for chest pain. 25 tablet 3  . potassium chloride SA  (KLOR-CON) 10 MEQ tablet Take 1 tablet (10 mEq total) by mouth 2 (two) times daily. 180 tablet 3   No current facility-administered medications for this visit.    Allergies:   Erythromycin    Social History:  The patient  reports that he quit smoking about 49 years ago. He has never used smokeless tobacco. He reports that he does not drink alcohol and does not use drugs.   Family History:  The patient's family history includes Cancer in his brother and father.    ROS:  Please see the history of present illness.    All other systems are reviewed and are negative.   PHYSICAL EXAM: VS:  BP (!) 156/65   Pulse 85   Ht 5' 0.5" (1.537 m)   Wt 178 lb 6.4 oz (80.9 kg)   SpO2 100%   BMI 34.27 kg/m  , BMI Body mass index is 34.27 kg/m.  General: Alert, oriented x3, no distress, appears elderly and frail Head: no evidence of trauma, PERRL, EOMI, no exophtalmos or lid lag, no myxedema, no xanthelasma; normal ears, nose and oropharynx Neck: normal jugular venous pulsations and no hepatojugular reflux; brisk carotid pulses without delay and no carotid bruits Chest: clear to auscultation, no signs of consolidation by percussion or palpation, normal fremitus, symmetrical and full respiratory excursions Cardiovascular: normal position and quality of the apical impulse, regular rhythm, normal first and paradoxically split second heart sounds, no murmurs, rubs or gallops Abdomen: no tenderness or distention, no masses by palpation, no abnormal pulsatility or arterial bruits, normal bowel sounds, no hepatosplenomegaly Extremities: no clubbing, cyanosis or edema; 2+ radial, ulnar and brachial pulses bilaterally; 2+ right femoral, posterior tibial and dorsalis pedis pulses; 2+ left femoral, posterior tibial and dorsalis pedis pulses; no subclavian or femoral bruits Neurological: grossly nonfocal Psych: Normal mood and affect    EKG:  EKG is not ordered Frazier.  The one ordered at his last appointment  shows atrial ventricular sequential pacing.  The AV delay is prolonged at 224 ms.  The QRS is quite narrow at 126 ms with positive R waves in V1.  The QTC is 471 ms.  Recent Labs: 12/04/2019: Magnesium 1.7; TSH 0.48 01/22/2020: ALT 26; Hemoglobin 15.5; Platelets 210 03/13/2020: BUN 13; Creatinine, Ser 1.08; Potassium 4.3; Sodium 142    Lipid Panel    Component Value Date/Time   CHOL 151 12/04/2019 1630   CHOL 117 08/21/2018 0943   TRIG 154 (H) 12/04/2019 1630   HDL 51 12/04/2019 1630   HDL 54 08/21/2018  0943   CHOLHDL 3.0 12/04/2019 1630   VLDL 12 03/07/2018 0459   LDLCALC 74 12/04/2019 1630      Wt Readings from Last 3 Encounters:  03/25/20 178 lb 6.4 oz (80.9 kg)  01/31/20 178 lb 3.2 oz (80.8 kg)  12/04/19 178 lb 6.4 oz (80.9 kg)      ASSESSMENT AND PLAN:  1. Chronic combined systolic and diastolic heart failure ([place])   2. Coronary artery disease of native artery of native heart with stable angina pectoris (HCC)   3. Paroxysmal atrial fibrillation ([place])   4. Hypercholesterolemia   5. Second degree atrioventricular block   6. Pacemaker   7. 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 Essential hypertension     1. CHF: Appears well compensated.  Edema is controlled.  He has not been able to tolerate higher doses of medications for heart failure due to hypotension/orthostatic hypotension.  In fact, I think we need to tolerate a little bit of systolic blood pressure elevation to avoid falls, like the ones that were occurring last month.  His hypokalemia has corrected.  We will decrease potassium chloride supplements to just 20 mEq daily. 2. CAD s/p CABG: With the high blood pressure, he is having occasional angina in the morning.  This is not surprising considering the complexity of his coronary disease.  The symptoms are mild and we will hold off adding antianginal medication for the time being.  Most recent imaging studies suggest that he may indeed have completely  lost vascular supply to the inferior wall due to occlusion of the SVG to the PDA/PLA.   3. AFib: On Eliquis.  Overall burden of arrhythmia is low.  CHA2DS2-VASc 5 (age 73, CAD/PAD, CHF, HTN). 4. HLP: On statin with lipid parameters very close to target range. 5. 2nd/3rd deg AVB: He does have an underlying escape rhythm that appears to be severe sinus bradycardia at about 40 bpm, where he has 1: 1 AV conduction.  At normal rates he requires 100% ventricular pacing. 6. PPM:  septal His bundle ventricular lead.  Normal device function.  Continue remote downloads every 3 months. 7.  Occluded right vertebral artery: Chronic.  No focal neurological complaints.  Suspect he also has significant intra cranial atherosclerotic disease.  Avoid hypotension. 8. HTN: Blood pressure little higher after stopping the hydrochlorothiazide.  I would allow systolic blood pressure up to 160 mmHg, in order to avoid falls.    No orders of the defined types were placed in this encounter.   Patient Instructions  Medication Instructions:  DECREASE the Potassium to 10 mEq twice daily *If you need a refill on your cardiac medications before your next appointment, please call your pharmacy*   Lab Work: None ordered If you have labs (blood work) drawn Frazier and your tests are completely normal, you will receive your results only by: [name] MyChart Message (if you have MyChart) OR . A paper copy in the mail If you have any lab test that is abnormal or we need to change your treatment, we will call you to review the results.   Testing/Procedures: None ordered   Follow-Up: At [facility], you and your health needs are our priority.  As part of our continuing mission to provide you with exceptional heart care, we have created designated Provider Care Teams.  These Care Teams include your primary Cardiologist (physician) and Advanced Practice Providers (APPs -  Physician Assistants and Nurse Practitioners) who all work  together to provide  you with the care you need, when you need it.  We recommend signing up for the patient portal called "MyChart".  Sign up information is provided on this After Visit Summary.  MyChart is used to connect with patients for Virtual Visits (Telemedicine).  Patients are able to view lab/test results, encounter notes, upcoming appointments, etc.  Non-urgent messages can be sent to your provider as well.   To learn more about what you can do with MyChart, go to [email or web address].    Your next appointment:   6 month(s)  The format for your next appointment:   In Person  Provider:   You may see Sanda Klein, MD or one of the following Advanced Practice Providers on your designated Care Team:    Almyra Deforest, PA-C  Fabian Sharp, [place] or   Roby Lofts, PA-C    Signed, Sanda Klein, MD  03/30/2020 9:19 AM    Sanda Klein, MD, [facility] [phone] office [phone] pager

## 2020-03-27 ENCOUNTER — Other Ambulatory Visit: Payer: Self-pay

## 2020-03-30 ENCOUNTER — Encounter: Payer: Self-pay | Admitting: Cardiovascular Disease

## 2020-04-01 ENCOUNTER — Encounter: Payer: Medicare Other | Admitting: Internal Medicine

## 2020-04-06 ENCOUNTER — Other Ambulatory Visit: Payer: Self-pay | Admitting: Cardiovascular Disease

## 2020-04-07 NOTE — Telephone Encounter (Signed)
Refill request

## 2020-04-13 ENCOUNTER — Other Ambulatory Visit
Admission: RE | Admit: 2020-04-13 | Discharge: 2020-04-13 | Disposition: A | Discharge status: Home / Self Care | Payer: Medicare Other | Source: Ambulatory Visit | Attending: Internal Medicine | Admitting: Internal Medicine

## 2020-04-13 DIAGNOSIS — R109 Unspecified abdominal pain: Secondary | ICD-10-CM | POA: Diagnosis not present

## 2020-04-13 DIAGNOSIS — R102 Pelvic and perineal pain: Secondary | ICD-10-CM | POA: Diagnosis not present

## 2020-04-13 DIAGNOSIS — R1084 Generalized abdominal pain: Secondary | ICD-10-CM | POA: Diagnosis not present

## 2020-04-13 LAB — COMPREHENSIVE METABOLIC PANEL
ALT: 25 U/L (ref 0–44)
AST: 26 U/L (ref 15–41)
Albumin: 4.4 g/dL (ref 3.5–5.0)
Alkaline Phosphatase: 57 U/L (ref 38–126)
Anion gap: 13 (ref 5–15)
BUN: 13 mg/dL (ref 8–23)
CO2: 27 mmol/L (ref 22–32)
Calcium: 9.3 mg/dL (ref 8.9–10.3)
Chloride: 100 mmol/L (ref 98–111)
Creatinine, Ser: 1.06 mg/dL (ref 0.61–1.24)
GFR calc Af Amer: >60 mL/min (ref >60)
GFR calc non Af Amer: >60 mL/min (ref >60)
Glucose, Bld: 81 mg/dL (ref 70–99)
Potassium: 3.8 mmol/L (ref 3.5–5.1)
Sodium: 140 mmol/L (ref 135–145)
Total Bilirubin: 0.8 mg/dL (ref 0.3–1.2)
Total Protein: 7.4 g/dL (ref 6.5–8.1)

## 2020-04-13 LAB — CBC WITH DIFFERENTIAL/PLATELET
Abs Immature Granulocytes: 0.03 10*3/uL (ref 0.00–0.07)
Basophils Absolute: 0.1 10*3/uL (ref 0.0–0.1)
Basophils Relative: 1 %
Eosinophils Absolute: 0.1 10*3/uL (ref 0.0–0.5)
Eosinophils Relative: 1 %
HCT: 46.2 % (ref 39.0–52.0)
Hemoglobin: 15.5 g/dL (ref 13.0–17.0)
Immature Granulocytes: 0 %
Lymphocytes Relative: 33 %
Lymphs Abs: 3.5 10*3/uL (ref 0.7–4.0)
MCH: 32.8 pg (ref 26.0–34.0)
MCHC: 33.5 g/dL (ref 30.0–36.0)
MCV: 97.7 fL (ref 80.0–100.0)
Monocytes Absolute: 1.2 10*3/uL — ABNORMAL HIGH (ref 0.1–1.0)
Monocytes Relative: 11 %
Neutro Abs: 5.9 10*3/uL (ref 1.7–7.7)
Neutrophils Relative %: 54 %
Platelets: 198 10*3/uL (ref 150–400)
RBC: 4.73 MIL/uL (ref 4.22–5.81)
RDW: 14 % (ref 11.5–15.5)
WBC: 10.8 10*3/uL — ABNORMAL HIGH (ref 4.0–10.5)
nRBC: 0 % (ref 0.0–0.2)

## 2020-04-13 LAB — URINALYSIS, ROUTINE W REFLEX MICROSCOPIC
Bacteria, UA: NONE SEEN
Bilirubin Urine: NEGATIVE
Glucose, UA: NEGATIVE mg/dL
Ketones, ur: NEGATIVE mg/dL
Leukocytes,Ua: NEGATIVE
Nitrite: NEGATIVE
Protein, ur: NEGATIVE mg/dL
Specific Gravity, Urine: 1.009 (ref 1.005–1.030)
Squamous Epithelial / HPF: NONE SEEN (ref 0–5)
pH: 5 (ref 5.0–8.0)

## 2020-04-13 LAB — LIPASE, BLOOD: Lipase: 32 U/L (ref 11–51)

## 2020-04-13 LAB — AMYLASE: Amylase: 47 U/L (ref 28–100)

## 2020-04-14 DIAGNOSIS — R109 Unspecified abdominal pain: Secondary | ICD-10-CM | POA: Diagnosis not present

## 2020-04-15 LAB — URINE CULTURE: Culture: NO GROWTH

## 2020-04-16 ENCOUNTER — Encounter: Payer: Self-pay | Admitting: Adult Health

## 2020-04-16 ENCOUNTER — Ambulatory Visit (INDEPENDENT_AMBULATORY_CARE_PROVIDER_SITE_OTHER): Payer: Medicare Other | Admitting: Adult Health

## 2020-04-16 ENCOUNTER — Other Ambulatory Visit: Payer: Self-pay

## 2020-04-16 VITALS — BP 122/64 | HR 75 | Temp 97.7°F | Wt 195.0 lb

## 2020-04-16 DIAGNOSIS — R1031 Right lower quadrant pain: Secondary | ICD-10-CM | POA: Diagnosis not present

## 2020-04-16 DIAGNOSIS — I255 Ischemic cardiomyopathy: Secondary | ICD-10-CM | POA: Diagnosis not present

## 2020-04-16 DIAGNOSIS — Z23 Encounter for immunization: Secondary | ICD-10-CM

## 2020-04-16 DIAGNOSIS — R1032 Left lower quadrant pain: Secondary | ICD-10-CM | POA: Diagnosis not present

## 2020-04-16 NOTE — Patient Instructions (Signed)
Hematuria, Adult Hematuria is blood in the urine. Blood may be visible in the urine, or it may be identified with a test. This condition can be caused by infections of the bladder, urethra, kidney, or prostate. Other possible causes include:  Kidney stones.  Cancer of the urinary tract.  Too much calcium in the urine.  Conditions that are passed from parent to child (inherited conditions).  Exercise that requires a lot of energy. Infections can usually be treated with medicine, and a kidney stone usually will pass through your urine. If neither of these is the cause of your hematuria, more tests may be needed to identify the cause of your symptoms. It is very important to tell your health care provider about any blood in your urine, even if it is painless or the blood stops without treatment. Blood in the urine, when it happens and then stops and then happens again, can be a symptom of a very serious condition, including cancer. There is no pain in the initial stages of many urinary cancers. Follow these instructions at home: Medicines  Take over-the-counter and prescription medicines only as told by your health care provider.  If you were prescribed an antibiotic medicine, take it as told by your health care provider. Do not stop taking the antibiotic even if you start to feel better. Eating and drinking  Drink enough fluid to keep your urine clear or pale yellow. It is recommended that you drink 3-4 quarts (2.8-3.8 L) a day. If you have been diagnosed with an infection, it is recommended that you drink cranberry juice in addition to large amounts of water.  Avoid caffeine, tea, and carbonated beverages. These tend to irritate the bladder.  Avoid alcohol because it may irritate the prostate (men). General instructions  If you have been diagnosed with a kidney stone, follow your health care provider's instructions about straining your urine to catch the stone.  Empty your bladder  often. Avoid holding urine for long periods of time.  If you are male: ? After a bowel movement, wipe from front to back and use each piece of toilet paper only once. ? Empty your bladder before and after sex.  Pay attention to any changes in your symptoms. Tell your health care provider about any changes or any new symptoms.  It is your responsibility to get your test results. Ask your health care provider, or the department performing the test, when your results will be ready.  Keep all follow-up visits as told by your health care provider. This is important. Contact a health care provider if:  You develop back pain.  You have a fever.  You have nausea or vomiting.  Your symptoms do not improve after 3 days.  Your symptoms get worse. Get help right away if:  You develop severe vomiting and are unable take medicine without vomiting.  You develop severe pain in your back or abdomen even though you are taking medicine.  You pass a large amount of blood in your urine.  You pass blood clots in your urine.  You feel very weak or like you might faint.  You faint. Summary  Hematuria is blood in the urine. It has many possible causes.  It is very important that you tell your health care provider about any blood in your urine, even if it is painless or the blood stops without treatment.  Take over-the-counter and prescription medicines only as told by your health care provider.  Drink enough fluid to keep   your urine clear or pale yellow. This information is not intended to replace advice given to you by your health care provider. Make sure you discuss any questions you have with your health care provider. Document Revised: 11/29/2018 Document Reviewed: 08/07/2016 Elsevier Patient Education  2020 [employer]  

## 2020-04-16 NOTE — Progress Notes (Signed)
Assessment and Plan:  Airam was seen today for groin pain.  Diagnoses and all orders for this visit:  Bilateral groin pain Bil groin pain with diarrhea 1 day reported, has resolved Pain has since resolved; no concerning sx in last 4-5 days ? Gastroenteritis; no hernia on exam, unlikely with bilateral presentation ? Renal stone that passed Labs from home health reviewed; will follow up on leukocytosis, mild hematuria (note hx of renal stone - possible explanation) Benign exam; CT not indicated at this time unless persistent/progressive hematuria Follow up as indicated by labs or with recurrent/new sx -     CBC with Differential/Platelet -     Urinalysis w microscopic + reflex cultur  Further disposition pending results of labs. Discussed med's effects and SE's.   Over 20 minutes of exam, counseling, chart review, and critical decision making was performed.   Future Appointments  Date Time Provider [place]  05/28/2020  3:00 PM Unk Pinto, MD [facility] None  06/18/2020  8:35 AM [facility] DEVICE REMOTES [facility]  09/17/2020  8:35 AM [facility] DEVICE REMOTES [facility]  12/17/2020  8:35 AM [facility] DEVICE REMOTES [facility]    ------------------------------------------------------------------------------------------------------------------   HPI BP 122/64   Pulse 75   Temp 97.7 F (36.5 C)   Wt 195 lb (88.5 kg)   SpO2 99%   BMI 37.46 kg/m   86 y.o.male presents for evaluation of bilateral groin pain. Denies current sx.   He denies notable event prior to onset, he reports got up one morning ? 1 week ago, felt bilateral groin pain, hurt to walk, intermittent. He also notes had several episodes of diarrhea that day which resolved after that day. Denied mucus, blood in stool. No N/V. No fever/chills.  He reports had several days of pain with getting up out of chair and ambulation. He reports aching nature,  non-radiating, always bilateral, but left resolved faster than right. Reports pain was 6/10, but would resolve quickly once he saw down.   He apparently had labs, urine, xray by home health, unsure of results.  He was advised to follow up here for further imaging.  He reports pain has fully resolved, no concerns today, no pain in the last 4-5 days.   After exam was able to contact [facility] contact, spoke with Thyra Breed, MA who was able to verbaly communicate results to me, then faxed over CBC with WBC 10.8, mildly elevated monocytes, UA showed small amount of blood, culture was negative, CMP was unremarkable, KUB was unremarkable.   He reports didn't any interventions, but pain spontaneously resolved, hasn't had in last 4-5 days.   Past Medical History:  Diagnosis Date  . Arthritis   . CAD (coronary artery disease)   . Hyperlipidemia   . Hypertension   . Old myocardial infarct   . Right inguinal hernia 04/19/2012  . S/P CABG x 5 08/29/1984   LIMA to LAD,SVG to diagonal,SVG to OM,SVG to PDA and PLA  . Tremor, essential      Allergies  Allergen Reactions  . Erythromycin Nausea Only    Current Outpatient Medications on File Prior to Visit  Medication Sig  . acetaminophen (TYLENOL) 325 MG tablet Take 650 mg by mouth every 6 (six) hours as needed.  [name] atorvastatin (LIPITOR) 40 MG tablet Take 1 tablet daily for Cholesterol  . buPROPion (WELLBUTRIN XL) 300 MG 24 hr tablet Take 1 tablet daily for Mood  . citalopram (CELEXA) 40 MG tablet Take 1 tablet (40 mg total)  by mouth daily.  [name] co-enzyme Q-10 30 MG capsule Take 30 mg by mouth daily.   . diazepam (VALIUM) 5 MG tablet Take 1/2 to 1 tablet 2 to 3 x / day only if needed for Muscle spasms  & please try to limit to 5 days /week to avoid addiction  . Digestive Enzymes CAPS Take 1-2 capsules by mouth as needed (to aid digestive function).   [name] ELIQUIS 5 MG TABS tablet TAKE 1 TABLET BY MOUTH  TWICE DAILY  . furosemide (LASIX) 40 MG  tablet Take 1 tablet (40 mg total) by mouth daily.  [name] gabapentin (NEURONTIN) 300 MG capsule TAKE 1 CAPSULE BY MOUTH 3  TIMES DAILY AS NEEDED FOR  CHRONIC PAIN  . Magnesium Chloride (MAGNESIUM DR PO) Take 2 tablets by mouth daily.   . metoprolol succinate (TOPROL-XL) 25 MG 24 hr tablet Take 1 tablet Daily for BP & Heart  . nitroGLYCERIN (NITROSTAT) 0.4 MG SL tablet Place 1 tablet (0.4 mg total) under the tongue every 5 (five) minutes as needed for chest pain.  . potassium chloride SA (KLOR-CON) 10 MEQ tablet Take 1 tablet (10 mEq total) by mouth 2 (two) times daily.  [name] losartan (COZAAR) 25 MG tablet Take 0.5 tablets (12.5 mg total) by mouth daily.   No current facility-administered medications on file prior to visit.    ROS: all negative except above.   Physical Exam:  BP 122/64   Pulse 75   Temp 97.7 F (36.5 C)   Wt 195 lb (88.5 kg)   SpO2 99%   BMI 37.46 kg/m   General Appearance: Well nourished, well dressed elder male in no apparent distress. Eyes: conjunctiva no swelling or erythema Sinuses: No Frontal/maxillary tenderness ENT/Mouth: Mask in place; Hearing normal.  Neck: Supple Respiratory: Respiratory effort normal, BS equal bilaterally without rales, rhonchi, wheezing or stridor.  Cardio: RRR with no MRGs. Brisk peripheral pulses without edema.  Abdomen: Soft, + BS.  Non tender, no guarding, rebound, no palpable inguinal hernias, masses. Lymphatics: Non tender without lymphadenopathy.  Musculoskeletal: Symmetrical ROM bil hips, 5/5 strength lower extremities, no muscular/tendon tenderness, slow steady gait.  Skin: Warm, dry without rashes, lesions, ecchymosis. He has healing abrasion to R forearm and bil knees without heat, erythema, purulent discharge.  Neuro: Normal muscle tone,  Sensation intact.  Psych: Awake and oriented X 3, normal affect, Insight and Judgment appropriate.  Male genitalia: no penile lesions or discharge no testicular masses Penis: normal, no  lesions and circumcised Urethral Meatus: normal Testicles: normal, no masses Scrotum: normal Epididymis: normal Vas deferens: normal     Izora Ribas, NP 2:07 PM [facility]

## 2020-04-17 NOTE — Progress Notes (Signed)
Patient is aware of lab results but would like a copy mailed to him. -Paul Frazier

## 2020-04-18 LAB — CBC WITH DIFFERENTIAL/PLATELET
Absolute Monocytes: 924 cells/uL (ref 200–950)
Basophils Absolute: 26 cells/uL (ref 0–200)
Basophils Relative: 0.3 %
Eosinophils Absolute: 176 cells/uL (ref 15–500)
Eosinophils Relative: 2 %
HCT: 44.6 % (ref 38.5–50.0)
Hemoglobin: 15.4 g/dL (ref 13.2–17.1)
Lymphs Abs: 3159 cells/uL (ref 850–3900)
MCH: 33.4 pg — ABNORMAL HIGH (ref 27.0–33.0)
MCHC: 34.5 g/dL (ref 32.0–36.0)
MCV: 96.7 fL (ref 80.0–100.0)
MPV: 10.5 fL (ref 7.5–12.5)
Monocytes Relative: 10.5 %
Neutro Abs: 4514 cells/uL (ref 1500–7800)
Neutrophils Relative %: 51.3 %
Platelets: 198 10*3/uL (ref 140–400)
RBC: 4.61 10*6/uL (ref 4.20–5.80)
RDW: 12.9 % (ref 11.0–15.0)
Total Lymphocyte: 35.9 %
WBC: 8.8 10*3/uL (ref 3.8–10.8)

## 2020-04-18 LAB — URINE CULTURE
MICRO NUMBER:: 11012571
Result:: NO GROWTH
SPECIMEN QUALITY:: ADEQUATE

## 2020-04-18 LAB — URINALYSIS W MICROSCOPIC + REFLEX CULTURE
Bacteria, UA: NONE SEEN /HPF
Bilirubin Urine: NEGATIVE
Hgb urine dipstick: NEGATIVE
Hyaline Cast: NONE SEEN /LPF
Leukocyte Esterase: NEGATIVE
Nitrites, Initial: NEGATIVE
Protein, ur: NEGATIVE
Specific Gravity, Urine: 1.03 (ref 1.001–1.03)
Squamous Epithelial / HPF: NONE SEEN /HPF (ref <=5)
pH: 5.5 (ref 5.0–8.0)

## 2020-04-18 LAB — CULTURE INDICATED

## 2020-04-21 ENCOUNTER — Encounter: Payer: Self-pay | Admitting: Internal Medicine

## 2020-04-24 ENCOUNTER — Encounter: Payer: Self-pay | Admitting: Internal Medicine

## 2020-05-12 ENCOUNTER — Other Ambulatory Visit: Payer: Self-pay

## 2020-05-28 ENCOUNTER — Other Ambulatory Visit: Payer: Self-pay

## 2020-05-28 ENCOUNTER — Ambulatory Visit (INDEPENDENT_AMBULATORY_CARE_PROVIDER_SITE_OTHER): Payer: Medicare Other | Admitting: Internal Medicine

## 2020-05-28 ENCOUNTER — Encounter: Payer: Self-pay | Admitting: Internal Medicine

## 2020-05-28 VITALS — BP 140/72 | HR 86 | Temp 97.2°F | Resp 16 | Ht 66.0 in | Wt 173.0 lb

## 2020-05-28 DIAGNOSIS — I25118 Atherosclerotic heart disease of native coronary artery with other forms of angina pectoris: Secondary | ICD-10-CM

## 2020-05-28 DIAGNOSIS — R7309 Other abnormal glucose: Secondary | ICD-10-CM

## 2020-05-28 DIAGNOSIS — Z125 Encounter for screening for malignant neoplasm of prostate: Secondary | ICD-10-CM

## 2020-05-28 DIAGNOSIS — I5042 Chronic combined systolic (congestive) and diastolic (congestive) heart failure: Secondary | ICD-10-CM

## 2020-05-28 DIAGNOSIS — I442 Atrioventricular block, complete: Secondary | ICD-10-CM | POA: Diagnosis not present

## 2020-05-28 DIAGNOSIS — Z1211 Encounter for screening for malignant neoplasm of colon: Secondary | ICD-10-CM

## 2020-05-28 DIAGNOSIS — I10 Essential (primary) hypertension: Secondary | ICD-10-CM

## 2020-05-28 DIAGNOSIS — N401 Enlarged prostate with lower urinary tract symptoms: Secondary | ICD-10-CM

## 2020-05-28 DIAGNOSIS — E559 Vitamin D deficiency, unspecified: Secondary | ICD-10-CM

## 2020-05-28 DIAGNOSIS — Z950 Presence of cardiac pacemaker: Secondary | ICD-10-CM | POA: Diagnosis not present

## 2020-05-28 DIAGNOSIS — Z87891 Personal history of nicotine dependence: Secondary | ICD-10-CM

## 2020-05-28 DIAGNOSIS — I700 Atherosclerosis of aorta: Secondary | ICD-10-CM | POA: Diagnosis not present

## 2020-05-28 DIAGNOSIS — Z136 Encounter for screening for cardiovascular disorders: Secondary | ICD-10-CM

## 2020-05-28 DIAGNOSIS — N138 Other obstructive and reflux uropathy: Secondary | ICD-10-CM

## 2020-05-28 DIAGNOSIS — Z8249 Family history of ischemic heart disease and other diseases of the circulatory system: Secondary | ICD-10-CM

## 2020-05-28 DIAGNOSIS — E782 Mixed hyperlipidemia: Secondary | ICD-10-CM

## 2020-05-28 DIAGNOSIS — Z79899 Other long term (current) drug therapy: Secondary | ICD-10-CM | POA: Diagnosis not present

## 2020-05-28 DIAGNOSIS — D6869 Other thrombophilia: Secondary | ICD-10-CM

## 2020-05-28 NOTE — Patient Instructions (Signed)

## 2020-05-28 NOTE — Progress Notes (Signed)
Annual  Screening/Preventative Visit  & Comprehensive Evaluation & Examination      This very nice 86 y.o.  MWM presents for a Screening /Preventative Visit & comprehensive evaluation and management of multiple medical co-morbidities.  Patient has been followed for HTN, HLD, T2_NIDDM  Prediabetes and Vitamin D Deficiency. Abd CT Scan in 2014 found Aortic Atherosclerosis.      HTN predates since the 1980's.  Patient underwent a CABG in 1986. mIn 2001, he underwent PCA with Stent implanted.  He had a Negative Heart Cath in 2012.  Patient's BP has been controlled at home.  Today's BP is at goal 140/72.  He does admit occasional elevated BP's at home and repeat BP 160/80.Patient denies any cardiac symptoms as chest pain, palpitations, shortness of breath, dizziness or ankle swelling.      Patient's hyperlipidemia is controlled with diet and medications. Patient denies myalgias or other medication SE's. Last lipids were at goal:  Lab Results  Component Value Date   CHOL 151 12/04/2019   HDL 51 12/04/2019   LDLCALC 74 12/04/2019   TRIG 154 (H) 12/04/2019   CHOLHDL 3.0 12/04/2019       Patient has hx/o prediabetes  (A1c 5.8% /2013)  and patient denies reactive hypoglycemic symptoms, visual blurring, diabetic polys or paresthesias. Last A1c was not at goal:   Lab Results  Component Value Date   HGBA1C 5.9 (H) 07/23/2019        Finally, patient has history of Vitamin D Deficiency ("40" /2011) and last vitamin D was still very low  (goal 70-100):   Lab Results  Component Value Date   VD25OH 41 07/23/2019    Current Outpatient Medications on File Prior to Visit  Medication Sig  . acetaminophen  325 MG tablet Take 650 mg  every 6  hours as needed.  [name] atorvastatin  40 MG tablet Take 1 tablet daily for Cholesterol  . buPROPion - XL) 300 MG 24 hr tablet Take 1 tablet daily for Mood  . citalopram  40 MG tablet Take 1 tablet  daily.  [name] co-enzyme Q-10 30 MG capsule Take daily.   .  diazepam (VALIUM) 5 MG tablet Take 1/2 to 1 tablet 2 to 3 x / day only if needed  . Digestive Enzymes CAPS Take 1-2 capsules  as needed   . ELIQUIS 5 MG TABS tablet TAKE 1 TABLET  TWICE DAILY  . gabapentin 300 MG capsule TAKE 1 CAPSULE 3  TIMES DAILY   . Magnesium  Take 2 tablets  daily.   . metoprolol succ-XL 25 mg  Take 1 tablet Daily for BP & Heart  . NITROSTAT 0.4 MG SL tablet as needed for chest pain.  . potassium chloride SA  10 MEQ tablet Take 1 tablet 2  times daily.  . furosemide 40 MG tablet Take 1 tablet  daily.  . Losartan 25 MG tablet Take 1 tablet daily.     Allergies  Allergen Reactions  . Erythromycin Nausea Only    Past Medical History:  Diagnosis Date  . Arthritis   . CAD (coronary artery disease)   . Hyperlipidemia   . Hypertension   . Kidney stones   . Old myocardial infarct   . Right inguinal hernia 04/19/2012  . S/P CABG x 5 08/29/1984   LIMA to LAD,SVG to diagonal,SVG to OM,SVG to PDA and PLA  . Tremor, essential    Health Maintenance  Topic Date Due  . COVID-19 Vaccine (1) Never done  .  TETANUS/TDAP  07/19/2021  . INFLUENZA VACCINE  Completed  . PNA vac Low Risk Adult  Completed   Immunization History  Administered Date(s) Administered  . Influenza, High Dose Seasonal PF 05/14/2016, 06/08/2017, 05/11/2018, 04/16/2020  . Influenza-Unspecified 03/27/2019  . Pneumococcal Conjugate-13 05/11/2018  . Pneumococcal Polysaccharide-23 10/16/2014  . Td 07/20/2011   Last Colon - 02/24/2018 - Negative   Past Surgical History:  Procedure Laterality Date  . A-FLUTTER ABLATION N/A 03/09/2018   Procedure: A-FLUTTER ABLATION;  Surgeon: Evans Lance, MD;  Location: [place] [facility];  Service: Cardiovascular;  Laterality: N/A;  . CHOLECYSTECTOMY  10/1990  . CORONARY ARTERY BYPASS GRAFT  1986   x5 LIMA to LAD,SVG to diagonal,SVG to OM,SVG to PDA and PLA  . CORONARY STENT PLACEMENT    . INGUINAL HERNIA REPAIR  04/25/2012   right- laparoscopic  . PACEMAKER  IMPLANT N/A 03/09/2018   Procedure: PACEMAKER IMPLANT;  Surgeon: Evans Lance, MD;  Location: [place] [facility];  Service: Cardiovascular;  Laterality: N/A;  . R/P Myoview  12/27/2008   no ischemia  . TEE WITHOUT CARDIOVERSION N/A 03/09/2018   Procedure: TRANSESOPHAGEAL ECHOCARDIOGRAM (TEE);  Surgeon: Fay Records, MD;  [facility];  Service: Cardiovascular;  Laterality: N/A;  Bubble Study  . US ECHOCARDIOGRAPHY  12/27/2008   concentric LVH,mild to mod MR, TR and pulmonary hypertension. EF 45-50%   Family History  Problem Relation Age of Onset  . Cancer Father        bone  . Cancer Brother        lymphnode   Social History   Socioeconomic History  . Marital status: Married    Spouse name: [name]  . Number of children: 3  . Years of education: 62  . Highest education level: Not on file  Occupational History  . Occupation: Retired     Comment: retired  Tobacco Use  . Smoking status: Former Smoker    Quit date: 07/19/1970    Years since quitting: 49.8  . Smokeless tobacco: Never Used  Vaping Use  . Vaping Use: Never used  Substance and Sexual Activity  . Alcohol use: No    Alcohol/week: 0.0 standard drinks  . Drug use: No  . Sexual activity: Not Currently  Other Topics Concern  . Not on file  Social History Narrative   Drinks 1-2 cups of coffee a day, occasional coke      ROS Constitutional: Denies fever, chills, weight loss/gain, headaches, insomnia,  night sweats or change in appetite. Does c/o fatigue. Eyes: Denies redness, blurred vision, diplopia, discharge, itchy or watery eyes.  ENT: Denies discharge, congestion, post nasal drip, epistaxis, sore throat, earache, hearing loss, dental pain, Tinnitus, Vertigo, Sinus pain or snoring.  Cardio: Denies chest pain, palpitations, irregular heartbeat, syncope, dyspnea, diaphoresis, orthopnea, PND, claudication or edema Respiratory: denies cough, dyspnea, DOE, pleurisy, hoarseness, laryngitis or wheezing.    Gastrointestinal: Denies dysphagia, heartburn, reflux, water brash, pain, cramps, nausea, vomiting, bloating, diarrhea, constipation, hematemesis, melena, hematochezia, jaundice or hemorrhoids Genitourinary: Denies dysuria, frequency, urgency, nocturia, hesitancy, discharge, hematuria or flank pain Musculoskeletal: Denies arthralgia, myalgia, stiffness, Jt. Swelling, pain, limp or strain/sprain. Denies Falls. Skin: Denies puritis, rash, hives, warts, acne, eczema or change in skin lesion Neuro: No weakness, tremor, incoordination, spasms, paresthesia or pain Psychiatric: Denies confusion, memory loss or sensory loss. Denies Depression. Endocrine: Denies change in weight, skin, hair change, nocturia, and paresthesia, diabetic polys, visual blurring or hyper / hypo glycemic episodes.  Heme/Lymph: No excessive  bleeding, bruising or enlarged lymph nodes.  Physical Exam  BP 140/72   Pulse 86   Temp (!) 97.2 F (36.2 C)   Resp 16   Ht 5\' 6"  (1.676 m)   Wt 173 lb (78.5 kg)   SpO2 98%   BMI 27.92 kg/m   General Appearance: Well nourished and well groomed and in no apparent distress.  Eyes: PERRLA, EOMs, conjunctiva no swelling or erythema, normal fundi and vessels. Sinuses: No frontal/maxillary tenderness ENT/Mouth: EACs patent / TMs  nl. Nares clear without erythema, swelling, mucoid exudates. Oral hygiene is good. No erythema, swelling, or exudate. Tongue normal, non-obstructing. Tonsils not swollen or erythematous. Hearing normal.  Neck: Supple, thyroid not palpable. No bruits, nodes or JVD. Respiratory: Respiratory effort normal.  BS equal and clear bilateral without rales, rhonci, wheezing or stridor. Cardio: Heart sounds are normal with regular rate and rhythm and no murmurs, rubs or gallops. Peripheral pulses are normal and equal bilaterally without edema. No aortic or femoral bruits. Chest: symmetric with normal excursions and percussion.  Abdomen: Soft, with Nl bowel sounds.  Nontender, no guarding, rebound, hernias, masses, or organomegaly.  Lymphatics: Non tender without lymphadenopathy.  Musculoskeletal: Full ROM all peripheral extremities, joint stability, 5/5 strength, and normal gait. Skin: Warm and dry without rashes, lesions, cyanosis, clubbing or  ecchymosis.  Neuro: Cranial nerves intact, reflexes equal bilaterally. Normal muscle tone, no cerebellar symptoms. Sensation intact.  Pysch: Alert and oriented X 3 with normal affect, insight and judgment appropriate.   Assessment and Plan  1. Essential hypertension  - EKG 12-Lead - [place], RETROPERITNL ABD,  LTD - Urinalysis, Routine w reflex microscopic - Microalbumin / creatinine urine ratio - CBC with Differential/Platelet - COMPLETE METABOLIC PANEL WITH GFR - Magnesium - TSH  2. Hyperlipidemia, mixed  - EKG 12-Lead - [place], RETROPERITNL ABD,  LTD - Lipid panel - TSH  3. Abnormal glucose  - EKG 12-Lead - [place], RETROPERITNL ABD,  LTD - Hemoglobin A1c - Insulin, random  4. Vitamin D deficiency  - VITAMIN D 25 Hydroxy   5. Atherosclerosis of aorta (HCC)  - EKG 12-Lead - [place], RETROPERITNL ABD,  LTD - Lipid panel  6. Complete heart block (HCC)  - EKG 12-Lead  7. Pacemaker   8. Coronary artery disease of native artery of native heart with stable angina pectoris (HCC)  - EKG 12-Lead  9. Chronic combined systolic and diastolic heart failure (HCC)  - EKG 12-Lead - [place], RETROPERITNL ABD,  LTD  10. BPH with obstruction/lower urinary tract symptoms  - PSA  11. Acquired thrombophilia (HCC)  - [place], RETROPERITNL ABD,  LTD - Lipid panel  12. Screening for colorectal cancer  - POC Hemoccult Bld/Stl   13. Prostate cancer screening  - PSA  14. Screening for ischemic heart disease  - EKG 12-Lead - [place], RETROPERITNL ABD,  LTD  15. Screening for AAA (aortic abdominal aneurysm)  - [place], RETROPERITNL ABD,  LTD  16. FHx: heart disease  - EKG 12-Lead - [place], RETROPERITNL ABD,   LTD  17. Former smoker   54. Medication management  - Urinalysis, Routine w reflex microscopic - Microalbumin / creatinine urine ratio - CBC with Differential/Platelet - COMPLETE METABOLIC PANEL WITH GFR - Magnesium - Lipid panel - TSH - Hemoglobin A1c - Insulin, random - VITAMIN D 25 Hydroxy       Patient was counseled in prudent diet, weight control to achieve/maintain BMI less than 25, BP monitoring, regular exercise and medications as discussed.  Discussed  med effects and SE's. Routine screening labs and tests as requested with regular follow-up as recommended. Over 40 minutes of exam, counseling, chart review and high complex critical decision making was performed   Kirtland Bouchard, MD

## 2020-05-29 ENCOUNTER — Encounter: Payer: Self-pay | Admitting: *Deleted

## 2020-05-29 ENCOUNTER — Other Ambulatory Visit: Payer: Self-pay | Admitting: Internal Medicine

## 2020-05-29 ENCOUNTER — Other Ambulatory Visit: Payer: Self-pay | Admitting: *Deleted

## 2020-05-29 DIAGNOSIS — N410 Acute prostatitis: Secondary | ICD-10-CM | POA: Diagnosis not present

## 2020-05-29 LAB — URINALYSIS, ROUTINE W REFLEX MICROSCOPIC
Bilirubin Urine: NEGATIVE
Glucose, UA: NEGATIVE
Hyaline Cast: NONE SEEN /LPF
Nitrite: NEGATIVE
Specific Gravity, Urine: 1.024 (ref 1.001–1.03)
Squamous Epithelial / HPF: NONE SEEN /HPF
pH: 6 (ref 5.0–8.0)

## 2020-05-29 LAB — COMPLETE METABOLIC PANEL WITHOUT GFR
AG Ratio: 1.7 (calc) (ref 1.0–2.5)
ALT: 23 U/L (ref 9–46)
AST: 22 U/L (ref 10–35)
Albumin: 4.3 g/dL (ref 3.6–5.1)
Alkaline phosphatase (APISO): 59 U/L (ref 35–144)
BUN: 19 mg/dL (ref 7–25)
CO2: 32 mmol/L (ref 20–32)
Calcium: 9.8 mg/dL (ref 8.6–10.3)
Chloride: 103 mmol/L (ref 98–110)
Creat: 1.11 mg/dL (ref 0.70–1.11)
GFR, Est African American: 70 mL/min/{1.73_m2}
GFR, Est Non African American: 60 mL/min/{1.73_m2}
Globulin: 2.5 g/dL (ref 1.9–3.7)
Glucose, Bld: 83 mg/dL (ref 65–99)
Potassium: 4.9 mmol/L (ref 3.5–5.3)
Sodium: 141 mmol/L (ref 135–146)
Total Bilirubin: 0.5 mg/dL (ref 0.2–1.2)
Total Protein: 6.8 g/dL (ref 6.1–8.1)

## 2020-05-29 LAB — CBC WITH DIFFERENTIAL/PLATELET
Absolute Monocytes: 1210 cells/uL — ABNORMAL HIGH (ref 200–950)
Basophils Absolute: 40 cells/uL (ref 0–200)
Basophils Relative: 0.4 %
Eosinophils Absolute: 250 cells/uL (ref 15–500)
Eosinophils Relative: 2.5 %
HCT: 45.9 % (ref 38.5–50.0)
Hemoglobin: 15.5 g/dL (ref 13.2–17.1)
Lymphs Abs: 3410 cells/uL (ref 850–3900)
MCH: 32.3 pg (ref 27.0–33.0)
MCHC: 33.8 g/dL (ref 32.0–36.0)
MCV: 95.6 fL (ref 80.0–100.0)
MPV: 10.8 fL (ref 7.5–12.5)
Monocytes Relative: 12.1 %
Neutro Abs: 5090 cells/uL (ref 1500–7800)
Neutrophils Relative %: 50.9 %
Platelets: 211 10*3/uL (ref 140–400)
RBC: 4.8 10*6/uL (ref 4.20–5.80)
RDW: 12.9 % (ref 11.0–15.0)
Total Lymphocyte: 34.1 %
WBC: 10 10*3/uL (ref 3.8–10.8)

## 2020-05-29 LAB — LIPID PANEL
Cholesterol: 149 mg/dL (ref <200)
HDL: 57 mg/dL (ref >=40)
LDL Cholesterol (Calc): 68 mg/dL (calc)
Non-HDL Cholesterol (Calc): 92 mg/dL (calc) (ref <130)
Total CHOL/HDL Ratio: 2.6 (calc) (ref <5.0)
Triglycerides: 165 mg/dL — ABNORMAL HIGH (ref <150)

## 2020-05-29 LAB — HEMOGLOBIN A1C
Hgb A1c MFr Bld: 6.1 %{Hb} — ABNORMAL HIGH
Mean Plasma Glucose: 128 (calc)
eAG (mmol/L): 7.1 (calc)

## 2020-05-29 LAB — TSH: TSH: 0.81 m[IU]/L (ref 0.40–4.50)

## 2020-05-29 LAB — INSULIN, RANDOM: Insulin: 10.9 u[IU]/mL

## 2020-05-29 LAB — MICROALBUMIN / CREATININE URINE RATIO
Creatinine, Urine: 155 mg/dL (ref 20–320)
Microalb Creat Ratio: 30 mcg/mg creat — ABNORMAL HIGH (ref <30)
Microalb, Ur: 4.6 mg/dL

## 2020-05-29 LAB — VITAMIN D 25 HYDROXY (VIT D DEFICIENCY, FRACTURES): Vit D, 25-Hydroxy: 50 ng/mL (ref 30–100)

## 2020-05-29 LAB — PSA: PSA: 61.51 ng/mL — ABNORMAL HIGH (ref <=4.0)

## 2020-05-29 LAB — MAGNESIUM: Magnesium: 2 mg/dL (ref 1.5–2.5)

## 2020-05-29 NOTE — Progress Notes (Signed)
========================================================== ==========================================================  -    U/A suspicious for Prostate Infection - will send f   - PSA - ( up to 61.51) - still elevated (now even higher) which may be due to  a  prostate infection  (Will send copy to Dr Alinda Money also)  ==========================================================  -  Total Chol = 149 and LDL = 68  - Both  Excellent   - Very low risk for Heart Attack  / Stroke =========================================================  - A1c = 6.1% - also slightly higher -   - Avoid Sweets, Candy & White Stuff   - Rice, Potatoes, Breads &  Pasta ==========================================================  -  Vitamin D = 50   ( Slightly low - Vitamin D goal is between 70-100)   - Please increase & add an extra 2,000 units more Vitamin D per day   - It is very important as a natural anti-inflammatory and helping the  immune system protect against viral infections, like the Covid-19    helping hair, skin, and nails, as well as reducing stroke and  heart attack risk.   - It helps your bones and helps with mood.  - It also decreases numerous cancer risks so please  take it as directed.   - Low Vit D is associated with a 200-300% higher risk for  CANCER   and 200-300% higher risk for HEART   ATTACK  &  STROKE.    - It is also associated with higher death rate at younger ages,   autoimmune diseases like Rheumatoid arthritis, Lupus,  Multiple Sclerosis.     - Also many other serious conditions, like depression, Alzheimer's  Dementia, infertility, muscle aches, fatigue, fibromyalgia   - just to name a few. ==========================================================  - All Else - CBC - Kidneys - Electrolytes - Liver - Magnesium & Thyroid    - all  Normal / OK ==========================================================

## 2020-05-31 ENCOUNTER — Encounter: Payer: Self-pay | Admitting: Internal Medicine

## 2020-05-31 ENCOUNTER — Other Ambulatory Visit: Payer: Self-pay | Admitting: Internal Medicine

## 2020-05-31 DIAGNOSIS — N411 Chronic prostatitis: Secondary | ICD-10-CM

## 2020-05-31 LAB — URINE CULTURE
MICRO NUMBER:: 11192330
SPECIMEN QUALITY:: ADEQUATE

## 2020-05-31 MED ORDER — CEPHALEXIN 500 MG PO CAPS: ORAL_CAPSULE | ORAL | 0 refills | Status: DC

## 2020-05-31 NOTE — Progress Notes (Signed)
  _ Urine culture grew  Staph infection, so sent new Rx for   Keflex (Cephalexin) to drug store   - Please schedule a f/u NV   betw Dec 10th     to         the 24 th

## 2020-06-02 NOTE — Progress Notes (Signed)
Patient is aware of lab results. He is going to pick up the Keflex from his pharmacy and 1 mo. NV has been scheduled. -e welch

## 2020-06-03 DIAGNOSIS — L570 Actinic keratosis: Secondary | ICD-10-CM | POA: Diagnosis not present

## 2020-06-03 DIAGNOSIS — L853 Xerosis cutis: Secondary | ICD-10-CM | POA: Diagnosis not present

## 2020-06-03 DIAGNOSIS — D1801 Hemangioma of skin and subcutaneous tissue: Secondary | ICD-10-CM | POA: Diagnosis not present

## 2020-06-03 DIAGNOSIS — L820 Inflamed seborrheic keratosis: Secondary | ICD-10-CM | POA: Diagnosis not present

## 2020-06-03 DIAGNOSIS — L821 Other seborrheic keratosis: Secondary | ICD-10-CM | POA: Diagnosis not present

## 2020-06-03 DIAGNOSIS — L578 Other skin changes due to chronic exposure to nonionizing radiation: Secondary | ICD-10-CM | POA: Diagnosis not present

## 2020-06-03 DIAGNOSIS — M7981 Nontraumatic hematoma of soft tissue: Secondary | ICD-10-CM | POA: Diagnosis not present

## 2020-06-03 DIAGNOSIS — L905 Scar conditions and fibrosis of skin: Secondary | ICD-10-CM | POA: Diagnosis not present

## 2020-06-03 DIAGNOSIS — L814 Other melanin hyperpigmentation: Secondary | ICD-10-CM | POA: Diagnosis not present

## 2020-06-18 ENCOUNTER — Ambulatory Visit (INDEPENDENT_AMBULATORY_CARE_PROVIDER_SITE_OTHER): Payer: Medicare Other

## 2020-06-18 DIAGNOSIS — I442 Atrioventricular block, complete: Secondary | ICD-10-CM

## 2020-06-18 LAB — CUP PACEART REMOTE DEVICE CHECK
Battery Remaining Longevity: 78 mo
Battery Voltage: 2.97 V
Brady Statistic AP VP Percent: 72.95 %
Brady Statistic AP VS Percent: 0.1 %
Brady Statistic AS VP Percent: 25.75 %
Brady Statistic AS VS Percent: 1.2 %
Brady Statistic RA Percent Paced: 73.86 %
Brady Statistic RV Percent Paced: 98.7 %
Date Time Interrogation Session: 20211130182703
Implantable Lead Implant Date: 20190822
Implantable Lead Implant Date: 20190822
Implantable Lead Location: 753859
Implantable Lead Location: 753860
Implantable Lead Model: 3830
Implantable Lead Model: 5076
Implantable Pulse Generator Implant Date: 20190822
Lead Channel Impedance Value: 342 Ohm
Lead Channel Impedance Value: 361 Ohm
Lead Channel Impedance Value: 418 Ohm
Lead Channel Impedance Value: 494 Ohm
Lead Channel Pacing Threshold Amplitude: 0.625 V
Lead Channel Pacing Threshold Amplitude: 1.25 V
Lead Channel Pacing Threshold Pulse Width: 0.4 ms
Lead Channel Pacing Threshold Pulse Width: 0.4 ms
Lead Channel Sensing Intrinsic Amplitude: 5.375 mV
Lead Channel Sensing Intrinsic Amplitude: 5.375 mV
Lead Channel Sensing Intrinsic Amplitude: 8 mV
Lead Channel Sensing Intrinsic Amplitude: 8 mV
Lead Channel Setting Pacing Amplitude: 1.5 V
Lead Channel Setting Pacing Amplitude: 2.5 V
Lead Channel Setting Pacing Pulse Width: 1 ms
Lead Channel Setting Sensing Sensitivity: 2.8 mV

## 2020-06-25 ENCOUNTER — Other Ambulatory Visit: Payer: Self-pay

## 2020-06-25 DIAGNOSIS — Z1212 Encounter for screening for malignant neoplasm of rectum: Secondary | ICD-10-CM

## 2020-06-25 DIAGNOSIS — Z1211 Encounter for screening for malignant neoplasm of colon: Secondary | ICD-10-CM

## 2020-06-25 LAB — POC HEMOCCULT BLD/STL (HOME/3-CARD/SCREEN)
Card #2 Fecal Occult Blod, POC: NEGATIVE
Card #3 Fecal Occult Blood, POC: NEGATIVE
Fecal Occult Blood, POC: NEGATIVE

## 2020-06-25 NOTE — Progress Notes (Signed)
Remote pacemaker transmission.   

## 2020-06-26 DIAGNOSIS — Z1211 Encounter for screening for malignant neoplasm of colon: Secondary | ICD-10-CM | POA: Diagnosis not present

## 2020-06-26 DIAGNOSIS — Z1212 Encounter for screening for malignant neoplasm of rectum: Secondary | ICD-10-CM | POA: Diagnosis not present

## 2020-06-30 ENCOUNTER — Other Ambulatory Visit: Payer: Self-pay | Admitting: Cardiovascular Disease

## 2020-07-03 ENCOUNTER — Ambulatory Visit: Payer: Medicare Other

## 2020-07-04 ENCOUNTER — Other Ambulatory Visit: Payer: Self-pay

## 2020-07-04 ENCOUNTER — Ambulatory Visit (INDEPENDENT_AMBULATORY_CARE_PROVIDER_SITE_OTHER): Payer: Medicare Other

## 2020-07-04 VITALS — BP 132/76 | HR 66 | Temp 97.8°F | Ht 66.0 in | Wt 173.0 lb

## 2020-07-04 DIAGNOSIS — Z79899 Other long term (current) drug therapy: Secondary | ICD-10-CM

## 2020-07-06 LAB — URINALYSIS, ROUTINE W REFLEX MICROSCOPIC
Bacteria, UA: NONE SEEN /HPF
Bilirubin Urine: NEGATIVE
Glucose, UA: NEGATIVE
Hyaline Cast: NONE SEEN /LPF
Nitrite: NEGATIVE
Specific Gravity, Urine: 1.021 (ref 1.001–1.03)
Squamous Epithelial / HPF: NONE SEEN /HPF (ref <=5)
pH: 5.5 (ref 5.0–8.0)

## 2020-07-06 LAB — URINE CULTURE
MICRO NUMBER:: 11335019
Result:: NO GROWTH
SPECIMEN QUALITY:: ADEQUATE

## 2020-07-22 DIAGNOSIS — L570 Actinic keratosis: Secondary | ICD-10-CM | POA: Diagnosis not present

## 2020-08-07 DIAGNOSIS — B0089 Other herpesviral infection: Secondary | ICD-10-CM | POA: Diagnosis not present

## 2020-08-07 DIAGNOSIS — L570 Actinic keratosis: Secondary | ICD-10-CM | POA: Diagnosis not present

## 2020-08-18 ENCOUNTER — Other Ambulatory Visit: Payer: Self-pay | Admitting: Internal Medicine

## 2020-08-18 MED ORDER — AZITHROMYCIN 250 MG PO TABS: ORAL_TABLET | ORAL | 1 refills | Status: DC

## 2020-08-18 MED ORDER — DEXAMETHASONE 4 MG PO TABS: ORAL_TABLET | ORAL | 0 refills | Status: DC

## 2020-08-21 ENCOUNTER — Telehealth: Payer: Self-pay | Admitting: *Deleted

## 2020-08-21 NOTE — Telephone Encounter (Signed)
Reviewed medications with patient and mailed a copy to the patient.

## 2020-08-23 ENCOUNTER — Inpatient Hospital Stay (HOSPITAL_COMMUNITY)
Admission: EM | Admit: 2020-08-23 | Discharge: 2020-08-28 | DRG: 177 | Disposition: A | Discharge status: Skilled Nursing Facility | Payer: Medicare Other | Attending: Internal Medicine | Admitting: Internal Medicine

## 2020-08-23 ENCOUNTER — Emergency Department (HOSPITAL_COMMUNITY): Payer: Medicare Other

## 2020-08-23 ENCOUNTER — Encounter (HOSPITAL_COMMUNITY): Payer: Self-pay | Admitting: *Deleted

## 2020-08-23 ENCOUNTER — Other Ambulatory Visit: Payer: Self-pay

## 2020-08-23 DIAGNOSIS — E441 Mild protein-calorie malnutrition: Secondary | ICD-10-CM | POA: Diagnosis not present

## 2020-08-23 DIAGNOSIS — R809 Proteinuria, unspecified: Secondary | ICD-10-CM | POA: Diagnosis present

## 2020-08-23 DIAGNOSIS — R824 Acetonuria: Secondary | ICD-10-CM | POA: Diagnosis present

## 2020-08-23 DIAGNOSIS — I2510 Atherosclerotic heart disease of native coronary artery without angina pectoris: Secondary | ICD-10-CM | POA: Diagnosis not present

## 2020-08-23 DIAGNOSIS — I6782 Cerebral ischemia: Secondary | ICD-10-CM | POA: Diagnosis not present

## 2020-08-23 DIAGNOSIS — R140 Abdominal distension (gaseous): Secondary | ICD-10-CM | POA: Diagnosis not present

## 2020-08-23 DIAGNOSIS — E785 Hyperlipidemia, unspecified: Secondary | ICD-10-CM | POA: Diagnosis not present

## 2020-08-23 DIAGNOSIS — Z881 Allergy status to other antibiotic agents status: Secondary | ICD-10-CM

## 2020-08-23 DIAGNOSIS — E782 Mixed hyperlipidemia: Secondary | ICD-10-CM | POA: Diagnosis present

## 2020-08-23 DIAGNOSIS — F325 Major depressive disorder, single episode, in full remission: Secondary | ICD-10-CM | POA: Diagnosis present

## 2020-08-23 DIAGNOSIS — R059 Cough, unspecified: Secondary | ICD-10-CM | POA: Diagnosis not present

## 2020-08-23 DIAGNOSIS — E860 Dehydration: Secondary | ICD-10-CM | POA: Diagnosis present

## 2020-08-23 DIAGNOSIS — I110 Hypertensive heart disease with heart failure: Secondary | ICD-10-CM | POA: Diagnosis not present

## 2020-08-23 DIAGNOSIS — R748 Abnormal levels of other serum enzymes: Secondary | ICD-10-CM | POA: Diagnosis present

## 2020-08-23 DIAGNOSIS — Z743 Need for continuous supervision: Secondary | ICD-10-CM | POA: Diagnosis not present

## 2020-08-23 DIAGNOSIS — G319 Degenerative disease of nervous system, unspecified: Secondary | ICD-10-CM | POA: Diagnosis not present

## 2020-08-23 DIAGNOSIS — Z808 Family history of malignant neoplasm of other organs or systems: Secondary | ICD-10-CM

## 2020-08-23 DIAGNOSIS — M1990 Unspecified osteoarthritis, unspecified site: Secondary | ICD-10-CM | POA: Diagnosis present

## 2020-08-23 DIAGNOSIS — M6282 Rhabdomyolysis: Secondary | ICD-10-CM | POA: Diagnosis present

## 2020-08-23 DIAGNOSIS — I5042 Chronic combined systolic (congestive) and diastolic (congestive) heart failure: Secondary | ICD-10-CM | POA: Diagnosis present

## 2020-08-23 DIAGNOSIS — Z6827 Body mass index (BMI) 27.0-27.9, adult: Secondary | ICD-10-CM

## 2020-08-23 DIAGNOSIS — R279 Unspecified lack of coordination: Secondary | ICD-10-CM | POA: Diagnosis not present

## 2020-08-23 DIAGNOSIS — I10 Essential (primary) hypertension: Secondary | ICD-10-CM | POA: Diagnosis present

## 2020-08-23 DIAGNOSIS — Z87891 Personal history of nicotine dependence: Secondary | ICD-10-CM | POA: Diagnosis not present

## 2020-08-23 DIAGNOSIS — M62838 Other muscle spasm: Secondary | ICD-10-CM

## 2020-08-23 DIAGNOSIS — G250 Essential tremor: Secondary | ICD-10-CM | POA: Diagnosis not present

## 2020-08-23 DIAGNOSIS — Z7901 Long term (current) use of anticoagulants: Secondary | ICD-10-CM

## 2020-08-23 DIAGNOSIS — R110 Nausea: Secondary | ICD-10-CM | POA: Diagnosis not present

## 2020-08-23 DIAGNOSIS — Z888 Allergy status to other drugs, medicaments and biological substances status: Secondary | ICD-10-CM

## 2020-08-23 DIAGNOSIS — I429 Cardiomyopathy, unspecified: Secondary | ICD-10-CM | POA: Diagnosis not present

## 2020-08-23 DIAGNOSIS — R197 Diarrhea, unspecified: Secondary | ICD-10-CM | POA: Diagnosis not present

## 2020-08-23 DIAGNOSIS — J1282 Pneumonia due to coronavirus disease 2019: Secondary | ICD-10-CM | POA: Diagnosis present

## 2020-08-23 DIAGNOSIS — Z951 Presence of aortocoronary bypass graft: Secondary | ICD-10-CM

## 2020-08-23 DIAGNOSIS — U071 COVID-19: Secondary | ICD-10-CM | POA: Diagnosis not present

## 2020-08-23 DIAGNOSIS — D75839 Thrombocytosis, unspecified: Secondary | ICD-10-CM | POA: Diagnosis not present

## 2020-08-23 DIAGNOSIS — I447 Left bundle-branch block, unspecified: Secondary | ICD-10-CM | POA: Diagnosis present

## 2020-08-23 DIAGNOSIS — R531 Weakness: Secondary | ICD-10-CM | POA: Diagnosis not present

## 2020-08-23 DIAGNOSIS — R1111 Vomiting without nausea: Secondary | ICD-10-CM | POA: Diagnosis not present

## 2020-08-23 DIAGNOSIS — Z79899 Other long term (current) drug therapy: Secondary | ICD-10-CM

## 2020-08-23 DIAGNOSIS — R823 Hemoglobinuria: Secondary | ICD-10-CM | POA: Diagnosis present

## 2020-08-23 DIAGNOSIS — I700 Atherosclerosis of aorta: Secondary | ICD-10-CM | POA: Diagnosis present

## 2020-08-23 DIAGNOSIS — N200 Calculus of kidney: Secondary | ICD-10-CM | POA: Diagnosis not present

## 2020-08-23 DIAGNOSIS — I252 Old myocardial infarction: Secondary | ICD-10-CM

## 2020-08-23 DIAGNOSIS — M47812 Spondylosis without myelopathy or radiculopathy, cervical region: Secondary | ICD-10-CM | POA: Diagnosis not present

## 2020-08-23 DIAGNOSIS — J3489 Other specified disorders of nose and nasal sinuses: Secondary | ICD-10-CM | POA: Diagnosis not present

## 2020-08-23 DIAGNOSIS — I4892 Unspecified atrial flutter: Secondary | ICD-10-CM | POA: Diagnosis not present

## 2020-08-23 DIAGNOSIS — N281 Cyst of kidney, acquired: Secondary | ICD-10-CM | POA: Diagnosis not present

## 2020-08-23 DIAGNOSIS — R0902 Hypoxemia: Secondary | ICD-10-CM | POA: Diagnosis not present

## 2020-08-23 HISTORY — DX: Malignant (primary) neoplasm, unspecified: C80.1

## 2020-08-23 LAB — COMPREHENSIVE METABOLIC PANEL
ALT: 29 U/L (ref 0–44)
AST: 29 U/L (ref 15–41)
Albumin: 3.4 g/dL — ABNORMAL LOW (ref 3.5–5.0)
Alkaline Phosphatase: 45 U/L (ref 38–126)
Anion gap: 11 (ref 5–15)
BUN: 26 mg/dL — ABNORMAL HIGH (ref 8–23)
CO2: 24 mmol/L (ref 22–32)
Calcium: 8 mg/dL — ABNORMAL LOW (ref 8.9–10.3)
Chloride: 103 mmol/L (ref 98–111)
Creatinine, Ser: 1.03 mg/dL (ref 0.61–1.24)
GFR, Estimated: >60 mL/min (ref >60)
Glucose, Bld: 98 mg/dL (ref 70–99)
Potassium: 3.7 mmol/L (ref 3.5–5.1)
Sodium: 138 mmol/L (ref 135–145)
Total Bilirubin: 1 mg/dL (ref 0.3–1.2)
Total Protein: 6 g/dL — ABNORMAL LOW (ref 6.5–8.1)

## 2020-08-23 LAB — URINALYSIS, ROUTINE W REFLEX MICROSCOPIC
Bacteria, UA: NONE SEEN
Bilirubin Urine: NEGATIVE
Glucose, UA: NEGATIVE mg/dL
Ketones, ur: 20 mg/dL — AB
Leukocytes,Ua: NEGATIVE
Nitrite: NEGATIVE
Protein, ur: 30 mg/dL — AB
Specific Gravity, Urine: >1.046 — ABNORMAL HIGH (ref 1.005–1.030)
pH: 5 (ref 5.0–8.0)

## 2020-08-23 LAB — CK: Total CK: 588 U/L — ABNORMAL HIGH (ref 49–397)

## 2020-08-23 LAB — CBC WITH DIFFERENTIAL/PLATELET
Abs Immature Granulocytes: 0.07 10*3/uL (ref 0.00–0.07)
Basophils Absolute: 0 10*3/uL (ref 0.0–0.1)
Basophils Relative: 0 %
Eosinophils Absolute: 0 10*3/uL (ref 0.0–0.5)
Eosinophils Relative: 0 %
HCT: 42.4 % (ref 39.0–52.0)
Hemoglobin: 14.3 g/dL (ref 13.0–17.0)
Immature Granulocytes: 1 %
Lymphocytes Relative: 9 %
Lymphs Abs: 0.8 10*3/uL (ref 0.7–4.0)
MCH: 32.1 pg (ref 26.0–34.0)
MCHC: 33.7 g/dL (ref 30.0–36.0)
MCV: 95.3 fL (ref 80.0–100.0)
Monocytes Absolute: 1 10*3/uL (ref 0.1–1.0)
Monocytes Relative: 10 %
Neutro Abs: 7.8 10*3/uL — ABNORMAL HIGH (ref 1.7–7.7)
Neutrophils Relative %: 80 %
Platelets: 142 10*3/uL — ABNORMAL LOW (ref 150–400)
RBC: 4.45 MIL/uL (ref 4.22–5.81)
RDW: 13.7 % (ref 11.5–15.5)
WBC: 9.7 10*3/uL (ref 4.0–10.5)
nRBC: 0 % (ref 0.0–0.2)

## 2020-08-23 LAB — PROCALCITONIN: Procalcitonin: <0.1 ng/mL

## 2020-08-23 LAB — C-REACTIVE PROTEIN: CRP: 1.2 mg/dL — ABNORMAL HIGH (ref <1.0)

## 2020-08-23 LAB — LIPASE, BLOOD: Lipase: 23 U/L (ref 11–51)

## 2020-08-23 LAB — SARS CORONAVIRUS 2 BY RT PCR (HOSPITAL ORDER, PERFORMED IN ~~LOC~~ HOSPITAL LAB): SARS Coronavirus 2: POSITIVE — AB

## 2020-08-23 LAB — LACTIC ACID, PLASMA
Lactic Acid, Venous: 1.5 mmol/L (ref 0.5–1.9)
Lactic Acid, Venous: 1.7 mmol/L (ref 0.5–1.9)

## 2020-08-23 LAB — D-DIMER, QUANTITATIVE: D-Dimer, Quant: 0.55 ug/mL-FEU — ABNORMAL HIGH (ref 0.00–0.50)

## 2020-08-23 LAB — TRIGLYCERIDES: Triglycerides: 110 mg/dL (ref <150)

## 2020-08-23 LAB — LACTATE DEHYDROGENASE: LDH: 216 U/L — ABNORMAL HIGH (ref 98–192)

## 2020-08-23 LAB — FIBRINOGEN: Fibrinogen: 340 mg/dL (ref 210–475)

## 2020-08-23 LAB — FERRITIN: Ferritin: 99 ng/mL (ref 24–336)

## 2020-08-23 MED ORDER — METOPROLOL SUCCINATE ER 25 MG PO TB24
25.0000 mg | ORAL_TABLET | Freq: Every day | ORAL | Status: DC
  Administered 2020-08-24 – 2020-08-28 (×5): 25 mg via ORAL
  Filled 2020-08-23 (×5): qty 1

## 2020-08-23 MED ORDER — LOSARTAN POTASSIUM 25 MG PO TABS
12.5000 mg | ORAL_TABLET | Freq: Every day | ORAL | Status: DC
  Administered 2020-08-24 – 2020-08-28 (×5): 12.5 mg via ORAL
  Filled 2020-08-23 (×5): qty 1

## 2020-08-23 MED ORDER — ACETAMINOPHEN 325 MG PO TABS: 650.0000 mg | ORAL_TABLET | Freq: Four times a day (QID) | ORAL | Status: DC | PRN

## 2020-08-23 MED ORDER — LACTATED RINGERS IV SOLN: INTRAVENOUS | Status: DC

## 2020-08-23 MED ORDER — METHYLPREDNISOLONE SODIUM SUCC 40 MG IJ SOLR
40.0000 mg | Freq: Two times a day (BID) | INTRAMUSCULAR | Status: AC
  Administered 2020-08-24 – 2020-08-26 (×6): 40 mg via INTRAVENOUS
  Filled 2020-08-23 (×6): qty 1

## 2020-08-23 MED ORDER — ZINC SULFATE 220 (50 ZN) MG PO CAPS
220.0000 mg | ORAL_CAPSULE | Freq: Every day | ORAL | Status: DC
  Administered 2020-08-23 – 2020-08-28 (×6): 220 mg via ORAL
  Filled 2020-08-23 (×6): qty 1

## 2020-08-23 MED ORDER — STERILE WATER FOR INJECTION IV SOLN
INTRAVENOUS | Status: DC
  Filled 2020-08-23 (×7): qty 9.62

## 2020-08-23 MED ORDER — SODIUM CHLORIDE 0.9 % IV SOLN
200.0000 mg | Freq: Once | INTRAVENOUS | Status: AC
  Administered 2020-08-23: 200 mg via INTRAVENOUS
  Filled 2020-08-23: qty 200

## 2020-08-23 MED ORDER — ACETAMINOPHEN 650 MG RE SUPP: 650.0000 mg | Freq: Four times a day (QID) | RECTAL | Status: DC | PRN

## 2020-08-23 MED ORDER — NITROGLYCERIN 0.4 MG SL SUBL: 0.4000 mg | SUBLINGUAL_TABLET | SUBLINGUAL | Status: DC | PRN

## 2020-08-23 MED ORDER — ACETAMINOPHEN 325 MG PO TABS
650.0000 mg | ORAL_TABLET | Freq: Once | ORAL | Status: AC
  Administered 2020-08-23: 650 mg via ORAL
  Filled 2020-08-23: qty 2

## 2020-08-23 MED ORDER — PREDNISONE 20 MG PO TABS
50.0000 mg | ORAL_TABLET | Freq: Every day | ORAL | Status: DC
  Administered 2020-08-27 – 2020-08-28 (×2): 50 mg via ORAL
  Filled 2020-08-23 (×3): qty 2

## 2020-08-23 MED ORDER — APIXABAN 5 MG PO TABS
5.0000 mg | ORAL_TABLET | Freq: Two times a day (BID) | ORAL | Status: DC
  Administered 2020-08-24 – 2020-08-28 (×11): 5 mg via ORAL
  Filled 2020-08-23 (×11): qty 1

## 2020-08-23 MED ORDER — POTASSIUM CHLORIDE CRYS ER 10 MEQ PO TBCR
10.0000 meq | EXTENDED_RELEASE_TABLET | Freq: Two times a day (BID) | ORAL | Status: DC
  Administered 2020-08-23 – 2020-08-28 (×11): 10 meq via ORAL
  Filled 2020-08-23 (×11): qty 1

## 2020-08-23 MED ORDER — GABAPENTIN 300 MG PO CAPS: 300.0000 mg | ORAL_CAPSULE | Freq: Three times a day (TID) | ORAL | Status: DC | PRN

## 2020-08-23 MED ORDER — ASCORBIC ACID 500 MG PO TABS
500.0000 mg | ORAL_TABLET | Freq: Every day | ORAL | Status: DC
  Administered 2020-08-24 – 2020-08-28 (×5): 500 mg via ORAL
  Filled 2020-08-23 (×5): qty 1

## 2020-08-23 MED ORDER — GUAIFENESIN-DM 100-10 MG/5ML PO SYRP
10.0000 mL | ORAL_SOLUTION | ORAL | Status: DC | PRN
  Administered 2020-08-25 – 2020-08-28 (×6): 10 mL via ORAL
  Filled 2020-08-23 (×10): qty 10

## 2020-08-23 MED ORDER — ATORVASTATIN CALCIUM 40 MG PO TABS: 40.0000 mg | ORAL_TABLET | Freq: Every day | ORAL | Status: DC

## 2020-08-23 MED ORDER — OMEPRAZOLE MAGNESIUM 20 MG PO TBEC: 20.0000 mg | DELAYED_RELEASE_TABLET | Freq: Every day | ORAL | Status: DC | PRN

## 2020-08-23 MED ORDER — VITAMIN D 25 MCG (1000 UNIT) PO TABS
5000.0000 [IU] | ORAL_TABLET | Freq: Every day | ORAL | Status: DC
  Administered 2020-08-24 – 2020-08-28 (×5): 5000 [IU] via ORAL
  Filled 2020-08-23 (×5): qty 5

## 2020-08-23 MED ORDER — PROCHLORPERAZINE EDISYLATE 10 MG/2ML IJ SOLN: 5.0000 mg | Freq: Four times a day (QID) | INTRAMUSCULAR | Status: DC | PRN

## 2020-08-23 MED ORDER — DIAZEPAM 5 MG PO TABS
2.5000 mg | ORAL_TABLET | Freq: Three times a day (TID) | ORAL | Status: DC | PRN
  Administered 2020-08-25: 2.5 mg via ORAL
  Filled 2020-08-23: qty 1

## 2020-08-23 MED ORDER — HYDROCOD POLST-CPM POLST ER 10-8 MG/5ML PO SUER: 2.5000 mL | Freq: Two times a day (BID) | ORAL | Status: DC | PRN

## 2020-08-23 MED ORDER — ALBUTEROL SULFATE HFA 108 (90 BASE) MCG/ACT IN AERS: 2.0000 | INHALATION_SPRAY | Freq: Four times a day (QID) | RESPIRATORY_TRACT | Status: DC | PRN

## 2020-08-23 MED ORDER — POTASSIUM CHLORIDE 2 MEQ/ML IV SOLN
INTRAVENOUS | Status: DC
  Filled 2020-08-23: qty 1000

## 2020-08-23 MED ORDER — SODIUM CHLORIDE 0.9 % IV BOLUS
1000.0000 mL | Freq: Once | INTRAVENOUS | Status: AC
  Administered 2020-08-23: 1000 mL via INTRAVENOUS

## 2020-08-23 MED ORDER — SODIUM CHLORIDE 0.9 % IV SOLN
100.0000 mg | Freq: Every day | INTRAVENOUS | Status: AC
  Administered 2020-08-24 – 2020-08-27 (×4): 100 mg via INTRAVENOUS
  Filled 2020-08-23 (×4): qty 20

## 2020-08-23 MED ORDER — IOHEXOL 300 MG/ML  SOLN
100.0000 mL | Freq: Once | INTRAMUSCULAR | Status: AC | PRN
  Administered 2020-08-23: 100 mL via INTRAVENOUS

## 2020-08-23 MED ORDER — AZITHROMYCIN 250 MG PO TABS: 250.0000 mg | ORAL_TABLET | ORAL | Status: DC

## 2020-08-23 MED ORDER — BUPROPION HCL ER (XL) 300 MG PO TB24
300.0000 mg | ORAL_TABLET | Freq: Every day | ORAL | Status: DC
  Administered 2020-08-24 – 2020-08-28 (×5): 300 mg via ORAL
  Filled 2020-08-23 (×5): qty 1

## 2020-08-23 NOTE — H&P (Signed)
History and Physical    Paul Frazier. WJX:[id number] DOB: 09-22-1932 DOA: 08/23/2020  PCP: Unk Pinto, MD   Patient coming from: Home.   I have personally briefly reviewed patient's old medical records in [place]  Chief Complaint: weakness.   HPI: Paul Frazier. is a 87 y.o. male with medical history significant of osteoarthritis, coronary artery disease, history of MI, CABG times fine, hypertension, hyperlipidemia, cholelithiasis, right inguinal hernia, essential tremor who is coming to the emergency department with complaints of progressively worse weakness associated with 7-8 episodes of emesis and one episode of loose stools (after he took 2 tablets of azithromycin 250 mg yesterday) for the past 2 to 3 days.  He also endorses sore throat and productive cough of "creamy-looking" sputum.  He denies abdominal pain, constipation, melena or hematochezia.  No flank pain, dysuria, frequency or hematuria. He denies dyspnea, headache, rhinorrhea, chest pain, palpitations, dizziness, diaphoresis, PND, orthopnea or pitting edema of the lower extremities.  He denies polyuria, polydipsia, polyphagia or blurred vision.  He denies travel history or known sick contacts.  He has not been vaccinated with any other COVID-19 vaccines.  The patient may have been taking a dexamethasone taper for the past few days, but is unable to a specify.  ED Course: Initial vital signs were temperature 100.4 F, pulse 66, respirations 17, blood pressure 109/49 mmHg and O2 sat 94% on room air.  The patient received 650 mg of acetaminophen p.o., NS 1000 mL IV bolus and IV remdesivir.  Labwork: Urinalysis had a specific gravity of more than 1.0 46, with small hemoglobinuria, ketonuria 20 and proteinuria 30 mg/dL.  Microscopic urine examination was unremarkable.  SARS coronavirus two by PCR was positive.  CBC showed a white count of 9.7, hemoglobin 14.3 g/dL and platelets 142.  D-dimer was 0.55 mcg/mL and  fibrinogen 340.  Normal ferritin, triglycerides and procalcitonin.  CRP was 1.2 mg/dL..  Lactic acid was normal twice.  CMP showed a BUN of 26 and calcium of 8.0 mg/dL.  Total protein was 6.0 and albumin 3.4 g/dL.  The rest of the CMP values were normal.  Imaging: A single view portable chest radiograph did not show any active disease.  CT abdomen/pelvis with contrast showed bilateral predominantly peripheral and subpleural areas of groundglass and patchy consolidation consistent with COVID pneumonia.  No evidence of acute abnormality within the abdomen or pelvis.  There was a 3 mm nonobstructive left renal calculus.  Multilobulated complicated cyst of the lower pole of the right kidney, diverticulosis, prostate enlargement and aortic atherosclerosis.  CT head without contrast did not show any acute intracranial findings.  CT cervical spine did not show any acute fracture or subluxation.  There was multilevel DJD changes of the cervical spine.  Please see images and full radiology report for further detail.  Review of Systems: As per HPI otherwise all other systems reviewed and are negative.  Past Medical History:  Diagnosis Date  . Arthritis   . CAD (coronary artery disease)   . Cancer ([place])   . Hyperlipidemia   . Hypertension   . Kidney stones   . Old myocardial infarct   . Right inguinal hernia 04/19/2012  . S/P CABG x 5 08/29/1984   LIMA to LAD,SVG to diagonal,SVG to OM,SVG to PDA and PLA  . Tremor, essential     Past Surgical History:  Procedure Laterality Date  . A-FLUTTER ABLATION N/A 03/09/2018   Procedure: A-FLUTTER ABLATION;  Surgeon: Evans Lance,  MD;  Location: [facility];  Service: Cardiovascular;  Laterality: N/A;  . CHOLECYSTECTOMY  10/1990  . CORONARY ARTERY BYPASS GRAFT  1986   x5 LIMA to LAD,SVG to diagonal,SVG to OM,SVG to PDA and PLA  . CORONARY STENT PLACEMENT    . INGUINAL HERNIA REPAIR  04/25/2012   right- laparoscopic  . PACEMAKER IMPLANT N/A 03/09/2018    Procedure: PACEMAKER IMPLANT;  Surgeon: Evans Lance, MD;  Location: [facility];  Service: Cardiovascular;  Laterality: N/A;  . R/P Myoview  12/27/2008   no ischemia  . TEE WITHOUT CARDIOVERSION N/A 03/09/2018   Procedure: TRANSESOPHAGEAL ECHOCARDIOGRAM (TEE);  Surgeon: Fay Records, MD;  Location: [facility] END[facility]COPY;  Service: Cardiovascular;  Laterality: N/A;  Bubble Study  . US ECHOCARDIOGRAPHY  12/27/2008   concentric LVH,mild to mod MR, TR and pulmonary hypertension. EF 45-50%    Social History  reports that he quit smoking about 50 years ago. He has never used smokeless tobacco. He reports that he does not drink alcohol and does not use drugs.  Allergies  Allergen Reactions  . Erythromycin Nausea Only    Family History  Problem Relation Age of Onset  . Cancer Father        bone  . Cancer Brother        lymphnode   Prior to Admission medications   Medication Sig Start Date End Date Taking? Authorizing Provider  atorvastatin (LIPITOR) 40 MG tablet Take 1 tablet daily for Cholesterol Patient taking differently: Take 40 mg by mouth daily. for Cholesterol 10/01/19  Yes Liane Comber, NP  azithromycin (ZITHROMAX) 250 MG tablet Take 2 tablets with Food on  Day 1, then 1 tablet Daily with Food for Sinusitis / Bronchitis Patient taking differently: Take 250-500 mg by mouth See admin instructions. Take 2 tablets (500 mg) by mouth on day 1 with food, take 1 tablet (250 mg) on days 2-5 -  for Sinusitis / Bronchitis 08/18/20  Yes Unk Pinto, MD  buPROPion (WELLBUTRIN XL) 300 MG 24 hr tablet Take 1 tablet daily for Mood Patient taking differently: Take 300 mg by mouth daily. for Mood 12/29/19  Yes Unk Pinto, MD  Cholecalciferol (VITAMIN D) 125 MCG (5000 UT) CAPS Take 5,000 Units by mouth daily.   Yes [provider]  citalopram (CELEXA) 40 MG tablet Take 1 tablet (40 mg total) by mouth daily. 10/08/19 10/07/20 Yes Vicie Mutters R, PA-C  dexamethasone (DECADRON) 4 MG  tablet Take 1 tab 3 x /day for 2 days,      then 2 x /day for 2  Days,     then 1 tab daily Patient taking differently: Take 4 mg by mouth See admin instructions. Take one tablet (4 mg) by mouth three times daily for 2 days, then take one tablet (4 mg) twice daily for 2 days, then take one tablet (4 mg0 daily for 3 days, then stop. 08/18/20  Yes Unk Pinto, MD  diazepam (VALIUM) 5 MG tablet Take 1/2 to 1 tablet 2 to 3 x / day only if needed for Muscle spasms  & please try to limit to 5 days /week to avoid addiction Patient taking differently: Take 2.5-5 mg by mouth 3 (three) times daily as needed for muscle spasms. please try to limit to 5 days /week to avoid addiction 10/08/19  Yes Silverio Lay, Amanda R, PA-C  ELIQUIS 5 MG TABS tablet TAKE 1 TABLET BY MOUTH  TWICE DAILY Patient taking differently: Take 5 mg by mouth  2 (two) times daily. 04/07/20  Yes Croitoru, Mihai, MD  furosemide (LASIX) 40 MG tablet TAKE 1 TABLET BY MOUTH  DAILY Patient taking differently: Take 40 mg by mouth daily. 07/01/20  Yes Croitoru, Mihai, MD  gabapentin (NEURONTIN) 300 MG capsule TAKE 1 CAPSULE BY MOUTH 3  TIMES DAILY AS NEEDED FOR  CHRONIC PAIN Patient taking differently: Take 300 mg by mouth 3 (three) times daily as needed (chronic pain). 03/17/20  Yes Judd Gaudier, NP  losartan (COZAAR) 25 MG tablet Take 0.5 tablets (12.5 mg total) by mouth daily. 10/05/19 03/25/20 Yes Croitoru, Mihai, MD  metoprolol succinate (TOPROL-XL) 25 MG 24 hr tablet Take 1 tablet Daily for BP & Heart Patient taking differently: Take 25 mg by mouth daily. for BP & Heart 10/01/19  Yes Doree Albee, PA-C  nitroGLYCERIN (NITROSTAT) 0.4 MG SL tablet Place 1 tablet (0.4 mg total) under the tongue every 5 (five) minutes as needed for chest pain. 12/25/19  Yes Lucky Cowboy, MD  omeprazole (PRILOSEC OTC) 20 MG tablet Take 20 mg by mouth daily as needed (acid reflux).   Yes [provider]  OVER THE COUNTER MEDICATION Take 2 tablets by mouth  daily. Magnesium Broad Spectrum Complex for muscle, nerve, bone health   Yes [provider]  OVER THE COUNTER MEDICATION Take 2 capsules by mouth daily. Omega Q with resveratrol and turmeric   Yes [provider]  potassium chloride SA (KLOR-CON) 20 MEQ tablet Take 40 mEq by mouth daily.   Yes [provider]  potassium chloride SA (KLOR-CON) 10 MEQ tablet Take 1 tablet (10 mEq total) by mouth 2 (two) times daily. Patient not taking: No sig reported 03/25/20   Thurmon Fair, MD    Physical Exam: Vitals:   08/23/20 1500 08/23/20 1630 08/23/20 1815  BP: (!) 109/49 (!) 105/55 (!) 110/49  Pulse: 66 62 60  Resp: 17 (!) 24 18  Temp: (!) 100.4 F (38 C)    TempSrc: Oral    SpO2: 94% 94% 93%   Constitutional: Looks acutely ill, but in NAD. Eyes: PERRL, lids and conjunctivae mildly injected. ENMT: Mucous membranes are dry. Posterior pharynx clear of any exudate or lesions. Neck: normal, supple, no masses, no thyromegaly Respiratory: Bibasilar crackles.  No wheezing.  Normal respiratory effort. No accessory muscle use.  Cardiovascular: Regular rate and rhythm, no murmurs / rubs / gallops. No extremity edema. 2+ pedal pulses. No carotid bruits.  Abdomen: No distention.  Bowel sounds positive.  Soft, no tenderness, no masses palpated. No hepatosplenomegaly. Musculoskeletal: Mild generalized weakness.  No clubbing / cyanosis.  S. Good ROM, no contractures. Normal muscle tone.  Skin: no acute rashes, lesions, ulcers on very limited dermatological examination. Neurologic: CN 2-12 grossly intact. Sensation intact, DTR normal.  Generalized, nonfocal weakness. Psychiatric: Normal judgment and insight. Alert and oriented x 3. Normal mood.   Labs on Admission: I have personally reviewed following labs and imaging studies  CBC: Recent Labs  Lab 08/23/20 1511  WBC 9.7  NEUTROABS 7.8*  HGB 14.3  HCT 42.4  MCV 95.3  PLT 142*    Basic Metabolic Panel: Recent Labs  Lab  08/23/20 1511  NA 138  K 3.7  CL 103  CO2 24  GLUCOSE 98  BUN 26*  CREATININE 1.03  CALCIUM 8.0*    GFR: CrCl cannot be calculated (Unknown ideal weight.).  Liver Function Tests: Recent Labs  Lab 08/23/20 1511  AST 29  ALT 29  ALKPHOS 45  BILITOT 1.0  PROT 6.0*  ALBUMIN 3.4*    Radiological Exams on Admission: CT Head Wo Contrast  Result Date: 08/23/2020 CLINICAL DATA:  Fall home today.  COVID positive EXAM: CT HEAD WITHOUT CONTRAST CT CERVICAL SPINE WITHOUT CONTRAST TECHNIQUE: Multidetector CT imaging of the head and cervical spine was performed following the standard protocol without intravenous contrast. Multiplanar CT image reconstructions of the cervical spine were also generated. COMPARISON:  CT January 22, 2019. FINDINGS: CT HEAD FINDINGS Brain: Unchanged mild age related atrophy with ex vacuo dilatation of the ventricular system. Similar microvascular ischemic change. No acute intracranial hemorrhage. No mass effect or midline shift. Vascular: No hyperdense vessel or unexpected calcification. Atherosclerotic calcifications of the bilateral carotid siphon. Skull: Normal. Negative for fracture or focal lesion. Sinuses/Orbits: Mild mucosal thickening in the ethmoid sinuses. Other: None. CT CERVICAL SPINE FINDINGS Alignment: Straightening normal cervical lordosis, likely positional. Skull base and vertebrae: No acute fracture. No primary bone lesion or focal pathologic process. Mineralized pannus posterior to the dens. Soft tissues and spinal canal: No prevertebral fluid or swelling. No visible canal hematoma. Disc levels: Multilevel degenerative changes of the cervical spine most significant from C5-C7. Upper chest: Negative. Other: None IMPRESSION: 1. No acute intracranial findings. 2. No acute fracture or subluxation of the cervical spine. 3. Multilevel degenerative changes of the cervical spine most significant from C5-C7. Electronically Signed   By: Dahlia Bailiff MD   On:  08/23/2020 17:48   CT Cervical Spine Wo Contrast  Result Date: 08/23/2020 CLINICAL DATA:  Fall home today.  COVID positive EXAM: CT HEAD WITHOUT CONTRAST CT CERVICAL SPINE WITHOUT CONTRAST TECHNIQUE: Multidetector CT imaging of the head and cervical spine was performed following the standard protocol without intravenous contrast. Multiplanar CT image reconstructions of the cervical spine were also generated. COMPARISON:  CT January 22, 2019. FINDINGS: CT HEAD FINDINGS Brain: Unchanged mild age related atrophy with ex vacuo dilatation of the ventricular system. Similar microvascular ischemic change. No acute intracranial hemorrhage. No mass effect or midline shift. Vascular: No hyperdense vessel or unexpected calcification. Atherosclerotic calcifications of the bilateral carotid siphon. Skull: Normal. Negative for fracture or focal lesion. Sinuses/Orbits: Mild mucosal thickening in the ethmoid sinuses. Other: None. CT CERVICAL SPINE FINDINGS Alignment: Straightening normal cervical lordosis, likely positional. Skull base and vertebrae: No acute fracture. No primary bone lesion or focal pathologic process. Mineralized pannus posterior to the dens. Soft tissues and spinal canal: No prevertebral fluid or swelling. No visible canal hematoma. Disc levels: Multilevel degenerative changes of the cervical spine most significant from C5-C7. Upper chest: Negative. Other: None IMPRESSION: 1. No acute intracranial findings. 2. No acute fracture or subluxation of the cervical spine. 3. Multilevel degenerative changes of the cervical spine most significant from C5-C7. Electronically Signed   By: Dahlia Bailiff MD   On: 08/23/2020 17:48   CT ABDOMEN PELVIS W CONTRAST  Result Date: 08/23/2020 CLINICAL DATA:  COVID positive. Abdominal distension. EXAM: CT ABDOMEN AND PELVIS WITH CONTRAST TECHNIQUE: Multidetector CT imaging of the abdomen and pelvis was performed using the standard protocol following bolus administration of  intravenous contrast. CONTRAST:  174mL OMNIPAQUE IOHEXOL 300 MG/ML  SOLN COMPARISON:  Dec 07, 2018 FINDINGS: Lower chest: Bilateral predominantly peripheral and subpleural areas of ground-glass and patchy consolidation, consistent with COVID pneumonia. Mildly enlarged heart. Partially visualized cardiac pacemaker leads. Hepatobiliary: No focal liver abnormality is seen. Status post cholecystectomy. No biliary dilatation. Pancreas: Unremarkable. No pancreatic ductal dilatation or surrounding inflammatory changes. Spleen: Normal in size without focal abnormality. Adrenals/Urinary  Tract: Multilobulated complicated cyst off of the lower pole of the right kidney measures 4.2 cm. Mild perinephric stranding bilaterally. No evidence of hydronephrosis 3 mm nonobstructive calculus in the midpole region of the left kidney. Normal adrenal glands. Stomach/Bowel: Normal appearance of the stomach. 3.7 cm duodenal diverticulum off of the first portion of the duodenum. Scattered left colonic diverticulosis without evidence of diverticulitis. Vascular/Lymphatic: Aortic atherosclerosis. No enlarged abdominal or pelvic lymph nodes. Reproductive: Mild enlargement of the prostate gland, which measures 4 cm transversely. Other: No abdominal wall hernia or abnormality. No abdominopelvic ascites. Musculoskeletal: Diffuse spondylosis of the lumbosacral spine. IMPRESSION: 1. Bilateral predominantly peripheral and subpleural areas of ground-glass and patchy consolidation, consistent with COVID pneumonia. 2. No evidence of acute abnormalities within the abdomen or pelvis. 3. 3 mm nonobstructive left renal calculus. 4. Multilobulated complicated cyst off of the lower pole of the right kidney measures 4.2 cm. 5. Scattered left colonic diverticulosis without evidence of diverticulitis. 6. Mild enlargement of the prostate gland. Please correlate to serum PSA values. 7. Aortic atherosclerosis. Aortic Atherosclerosis (ICD10-I70.0). Electronically  Signed   By: Fidela Salisbury M.D.   On: 08/23/2020 17:48   DG Chest Port 1 View  Result Date: 08/23/2020 CLINICAL DATA:  Cough. EXAM: PORTABLE CHEST 1 VIEW COMPARISON:  March 10, 2018. FINDINGS: The heart size and mediastinal contours are within normal limits. Sternotomy wires are noted. Left-sided pacemaker is unchanged in position. No pneumothorax or pleural effusion is noted. Lungs are clear. The visualized skeletal structures are unremarkable. IMPRESSION: No active disease. Electronically Signed   By: Marijo Conception M.D.   On: 08/23/2020 15:30   03/08/2018 echocardiogram  -------------------------------------------------------------------  LV EF: 30% -  35%   -------------------------------------------------------------------  Indications:   Atrial flutter 427.32.   -------------------------------------------------------------------  History:  PMH: Left bundle branch block. Coronary artery  disease. Cardiomyopathy. Risk factors: Hypertension.  Dyslipidemia.   -------------------------------------------------------------------  Study Conclusions   - Left ventricle: The cavity size was normal. Wall thickness was  normal. Systolic function was moderately to severely reduced. The  estimated ejection fraction was in the range of 30% to 35%.  Diffuse hypokinesis. There is akinesis of the inferior and  inferoseptal myocardium.  - Mitral valve: There was mild regurgitation.  - Left atrium: The atrium was mildly dilated.  - Tricuspid valve: There was moderate regurgitation.  - Pulmonary arteries: Systolic pressure was mildly increased. PA  peak pressure: 35 mm Hg (S).    EKG: Independently reviewed. Vent. rate 60 BPM PR interval * ms QRS duration 120 ms QT/QTc 481/481 ms P-R-T axes 116 -10 168 Sinus rhythm Atrial premature complexes Incomplete left bundle branch block  Assessment/Plan Principal Problem:   Pneumonia due to COVID-19  virus Observation/telemetry. Continue supplemental oxygen. Continue bronchodilators as needed. Flutter valve incentive spirometry. Prone positioning as tolerated PRN. Antitussive as needed. Antiemetic as needed. Remdesivir per pharmacy. Solu-Medrol 0.5 mg/kg every 12 hours. Follow-up CBC, CMP and inflammatory markers.  Active Problems:   Elevated CK He is volume depleted. Gentle/time-limited IV hydration. Urine alkalinization. Follow-up CK level in AM.    CAD (coronary artery disease) On apixaban, statin and beta-blocker.    Essential hypertension   Major depression in full remission (HCC) Continue bupropion 300 mg p.o. daily. Continue low-dose diazepam as needed for anxiety.    Paroxysmal atrial flutter (HCC) CHA?DS?-VASc Score of at least five. Continue apixaban 5 mg p.o. twice daily. Continue metoprolol succinate for rate control.    Chronic combined systolic and diastolic heart failure (  [place]) No signs of decompensation. He is volume depleted.  Hold furosemide for one or 2 days. Monitor intake and output. Continue losartan 12.5 mg p.o. daily. Continue metoprolol 25 mg p.o. daily. Monitor BP, HR, renal function electrolytes.    Hyperlipidemia   Atherosclerosis of aorta (HCC) Continue atorvastatin 40 mg p.o. daily.    Mild protein malnutrition (HCC) Protein supplementation. Consult nutritional services.    Thrombocytosis In the setting of viral infection. Monitor H&H.    DVT prophylaxis: On apixaban  Code Status:   Full code. Family Communication: Disposition Plan:   Patient is from:  Home.  Anticipated DC to:  Home.  Anticipated DC date:  08/25/2020 or 08/26/2020.  Anticipated DC barriers: Clinical status.  Consults called: Admission status:  Observation/telemetry.   Severity of Illness: Very high due to presenting with significant symptomatology and early COVID-19 pneumonia.  He has an increased risk for morbidity and mortality due to his age, CAD,  hypertension, atherosclerosis, atrial fibrillation, malnutrition, history of combined systolic and diastolic heart failure and vaccination status.  The patient has been admitted for COVID-19 pneumonia treatment with remdesivir, glucocorticoids, etc.  Reubin Milan MD [facility]  How to contact the [facility] Attending or Consulting provider [place] or covering provider during after hours [place], for this patient?   1. Check the care team in [facility] and look for a) attending/consulting [facility] provider listed and b) the [facility] team listed 2. Log into [email or web address] and use [place]'s universal password to access. If you do not have the password, please contact the hospital operator. 3. Locate the [facility] provider you are looking for under [facility] and page to a number that you can be directly reached. 4. If you still have difficulty reaching the provider, please page the [facility] (Director on Call) for the Hospitalists listed on amion for assistance.  08/23/2020, 7:54 PM   This document was prepared using Dragon voice recognition software and may contain some unintended transcription errors.

## 2020-08-23 NOTE — ED Triage Notes (Signed)
Pt also has diarrhea

## 2020-08-23 NOTE — ED Notes (Signed)
Pt has pacemaker ?

## 2020-08-23 NOTE — ED Triage Notes (Signed)
Per EMS pt lives at home alone. 23 h n/v gotten worse in last 6 hours. Weak, fell at home no injuries. On eliquis.  Walks with walker, has parjinson's.   130/70 H70 R 16  CBG 156  98.7 20G L                                                                                                                                                                                                        20 g LFA  563ml n/s 4mg  zofran

## 2020-08-23 NOTE — ED Notes (Signed)
Attempted to call report to 5W, receiving nurse refused report. Will try again

## 2020-08-23 NOTE — ED Provider Notes (Signed)
[facility] DEPT Provider Note   CSN: [id number] Arrival date & time: 08/23/20  1433     History Chief Complaint  Patient presents with  . Nausea  . Weakness    Paul Macaluso. is a 87 y.o. male hx of CAD, HL, HTN, previous MI, here presenting with vomiting and diarrhea and fall. Patient states that he was vomiting about 6 times today. He then has some diarrhea. He then slipped and fell and was unable to get up. Patient is on Eliquis. Patient unclear if he has any head injury or not. Patient was noted to have low-grade temp 100.4 in the ED. He states that he lives at home by himself and only gets contact with his son. He denies any sick contacts with Covid. He states that he never received the Covid vaccine  The history is provided by the patient.       Past Medical History:  Diagnosis Date  . Arthritis   . CAD (coronary artery disease)   . Cancer ([place])   . Hyperlipidemia   . Hypertension   . Kidney stones   . Old myocardial infarct   . Right inguinal hernia 04/19/2012  . S/P CABG x 5 08/29/1984   LIMA to LAD,SVG to diagonal,SVG to OM,SVG to PDA and PLA  . Tremor, essential     Patient Active Problem List   Diagnosis Date Noted  . At risk for falls 12/04/2019  . Unsteady gait 12/04/2019  . Acquired thrombophilia ([place]) 07/23/2019  . Chronic combined systolic and diastolic heart failure ([place]) 08/29/2018  . Pacemaker 08/29/2018  . History of transient ischemic attack (TIA) 08/29/2018  . Lumbar pain 06/09/2018  . Senile purpura ([place]) 05/11/2018  . Complete heart block ([place])   . Paroxysmal atrial flutter ([place]) 03/06/2018  . Atherosclerosis of aorta ([place]) 08/23/2016  . History of basal cell cancer 08/01/2015  . Major depression in full remission ([place]) 09/11/2014  . Essential hypertension 01/01/2014  . Vitamin D deficiency 01/01/2014  . Medication management 01/01/2014  . Other abnormal glucose 01/01/2014  . Nonsustained ventricular  tachycardia ([place]) 05/23/2013  . CAD (coronary artery disease) 01/08/2013  . Cardiomyopathy, ischemic 01/08/2013  . Hyperlipidemia 01/08/2013  . LBBB (left bundle branch block) 01/08/2013    Past Surgical History:  Procedure Laterality Date  . A-FLUTTER ABLATION N/A 03/09/2018   Procedure: A-FLUTTER ABLATION;  Surgeon: Evans Lance, MD;  Location: [facility];  Service: Cardiovascular;  Laterality: N/A;  . CHOLECYSTECTOMY  10/1990  . CORONARY ARTERY BYPASS GRAFT  1986   x5 LIMA to LAD,SVG to diagonal,SVG to OM,SVG to PDA and PLA  . CORONARY STENT PLACEMENT    . INGUINAL HERNIA REPAIR  04/25/2012   right- laparoscopic  . PACEMAKER IMPLANT N/A 03/09/2018   Procedure: PACEMAKER IMPLANT;  Surgeon: Evans Lance, MD;  Location: [facility];  Service: Cardiovascular;  Laterality: N/A;  . R/P Myoview  12/27/2008   no ischemia  . TEE WITHOUT CARDIOVERSION N/A 03/09/2018   Procedure: TRANSESOPHAGEAL ECHOCARDIOGRAM (TEE);  Surgeon: Fay Records, MD;  Location: [facility];  Service: Cardiovascular;  Laterality: N/A;  Bubble Study  . US ECHOCARDIOGRAPHY  12/27/2008   concentric LVH,mild to mod MR, TR and pulmonary hypertension. EF 45-50%       Family History  Problem Relation Age of Onset  . Cancer Father        bone  . Cancer Brother        lymphnode  Social History   Tobacco Use  . Smoking status: Former Smoker    Quit date: 07/19/1970    Years since quitting: 50.1  . Smokeless tobacco: Never Used  Vaping Use  . Vaping Use: Never used  Substance Use Topics  . Alcohol use: No    Alcohol/week: 0.0 standard drinks  . Drug use: No    Home Medications Prior to Admission medications   Medication Sig Start Date End Date Taking? Authorizing Provider  atorvastatin (LIPITOR) 40 MG tablet Take 1 tablet daily for Cholesterol Patient taking differently: Take 40 mg by mouth daily. for Cholesterol 10/01/19  Yes Liane Comber, NP  azithromycin (ZITHROMAX) 250 MG tablet  Take 2 tablets with Food on  Day 1, then 1 tablet Daily with Food for Sinusitis / Bronchitis Patient taking differently: Take 250-500 mg by mouth See admin instructions. Take 2 tablets (500 mg) by mouth on day 1 with food, take 1 tablet (250 mg) on days 2-5 -  for Sinusitis / Bronchitis 08/18/20  Yes Unk Pinto, MD  citalopram (CELEXA) 40 MG tablet Take 1 tablet (40 mg total) by mouth daily. 10/08/19 10/07/20 Yes Vicie Mutters R, PA-C  dexamethasone (DECADRON) 4 MG tablet Take 1 tab 3 x /day for 2 days,      then 2 x /day for 2  Days,     then 1 tab daily Patient taking differently: Take 4 mg by mouth See admin instructions. Take one tablet (4 mg) by mouth three times daily for 2 days, then take one tablet (4 mg) twice daily for 2 days, then take one tablet (4 mg0 daily for 3 days, then stop. 08/18/20  Yes Unk Pinto, MD  furosemide (LASIX) 40 MG tablet TAKE 1 TABLET BY MOUTH  DAILY Patient taking differently: Take 40 mg by mouth daily. 07/01/20  Yes Croitoru, Mihai, MD  gabapentin (NEURONTIN) 300 MG capsule TAKE 1 CAPSULE BY MOUTH 3  TIMES DAILY AS NEEDED FOR  CHRONIC PAIN Patient taking differently: Take 300 mg by mouth 3 (three) times daily as needed (chronic pain). 03/17/20  Yes Liane Comber, NP  losartan (COZAAR) 25 MG tablet Take 0.5 tablets (12.5 mg total) by mouth daily. 10/05/19 03/25/20 Yes Croitoru, Mihai, MD  metoprolol succinate (TOPROL-XL) 25 MG 24 hr tablet Take 1 tablet Daily for BP & Heart Patient taking differently: Take 25 mg by mouth daily. for BP & Heart 10/01/19  Yes Vladimir Crofts, PA-C  nitroGLYCERIN (NITROSTAT) 0.4 MG SL tablet Place 1 tablet (0.4 mg total) under the tongue every 5 (five) minutes as needed for chest pain. 12/25/19  Yes Unk Pinto, MD  buPROPion (WELLBUTRIN XL) 300 MG 24 hr tablet Take 1 tablet daily for Mood 12/29/19   Unk Pinto, MD  cephALEXin (KEFLEX) 500 MG capsule Take      1 capsule      4 x /day       with Food for Infection 05/31/20    Unk Pinto, MD  diazepam (VALIUM) 5 MG tablet Take 1/2 to 1 tablet 2 to 3 x / day only if needed for Muscle spasms  & please try to limit to 5 days /week to avoid addiction 10/08/19   Vicie Mutters R, PA-C  ELIQUIS 5 MG TABS tablet TAKE 1 TABLET BY MOUTH  TWICE DAILY 04/07/20   Croitoru, Dani Gobble, MD  potassium chloride SA (KLOR-CON) 10 MEQ tablet Take 1 tablet (10 mEq total) by mouth 2 (two) times daily. Patient not taking: Reported on 08/23/2020 03/25/20  Croitoru, Mihai, MD    Allergies    Erythromycin  Review of Systems   Review of Systems  Gastrointestinal: Positive for diarrhea and vomiting.  Neurological: Positive for weakness.  All other systems reviewed and are negative.   Physical Exam Updated Vital Signs BP (!) 110/49   Pulse 60   Temp (!) 100.4 F (38 C) (Oral)   Resp 18   SpO2 93%   Physical Exam Vitals and nursing note reviewed.  Constitutional:      Comments: Dehydrated   HENT:     Head: Normocephalic.     Nose: Nose normal.     Mouth/Throat:     Mouth: Mucous membranes are dry.  Eyes:     Pupils: Pupils are equal, round, and reactive to light.  Cardiovascular:     Rate and Rhythm: Normal rate and regular rhythm.     Pulses: Normal pulses.     Heart sounds: Normal heart sounds.  Pulmonary:     Effort: Pulmonary effort is normal.     Breath sounds: Normal breath sounds.  Abdominal:     General: Abdomen is flat.     Palpations: Abdomen is soft.     Comments: Mild epigastric tenderness   Musculoskeletal:        General: Normal range of motion.     Cervical back: Normal range of motion and neck supple.  Skin:    General: Skin is warm.     Capillary Refill: Capillary refill takes less than 2 seconds.  Neurological:     General: No focal deficit present.     Comments: A & O x 2. Patient moving all extremities, strength 5/5 bilateral arms   Psychiatric:        Mood and Affect: Mood normal.        Behavior: Behavior normal.     ED Results /  Procedures / Treatments   Labs (all labs ordered are listed, but only abnormal results are displayed) Labs Reviewed  SARS CORONAVIRUS 2 BY RT PCR (HOSPITAL ORDER, [facility]) - Abnormal; Notable for the following components:      Result Value   SARS Coronavirus 2 POSITIVE (*)    All other components within normal limits  CBC WITH DIFFERENTIAL/PLATELET - Abnormal; Notable for the following components:   Platelets 142 (*)    Neutro Abs 7.8 (*)    All other components within normal limits  COMPREHENSIVE METABOLIC PANEL - Abnormal; Notable for the following components:   BUN 26 (*)    Calcium 8.0 (*)    Total Protein 6.0 (*)    Albumin 3.4 (*)    All other components within normal limits  CK - Abnormal; Notable for the following components:   Total CK 588 (*)    All other components within normal limits  D-DIMER, QUANTITATIVE (NOT AT [facility] - Abnormal; Notable for the following components:   D-Dimer, Quant 0.55 (*)    All other components within normal limits  LACTATE DEHYDROGENASE - Abnormal; Notable for the following components:   LDH 216 (*)    All other components within normal limits  C-REACTIVE PROTEIN - Abnormal; Notable for the following components:   CRP 1.2 (*)    All other components within normal limits  URINE CULTURE  CULTURE, BLOOD (ROUTINE X 2)  CULTURE, BLOOD (ROUTINE X 2)  LACTIC ACID, PLASMA  LACTIC ACID, PLASMA  LIPASE, BLOOD  PROCALCITONIN  FERRITIN  TRIGLYCERIDES  FIBRINOGEN  URINALYSIS, ROUTINE W  REFLEX MICROSCOPIC  I-STAT CHEM 8, ED    EKG None  Radiology CT Head Wo Contrast  Result Date: 08/23/2020 CLINICAL DATA:  Fall home today.  COVID positive EXAM: CT HEAD WITHOUT CONTRAST CT CERVICAL SPINE WITHOUT CONTRAST TECHNIQUE: Multidetector CT imaging of the head and cervical spine was performed following the standard protocol without intravenous contrast. Multiplanar CT image reconstructions of the cervical spine were also  generated. COMPARISON:  CT January 22, 2019. FINDINGS: CT HEAD FINDINGS Brain: Unchanged mild age related atrophy with ex vacuo dilatation of the ventricular system. Similar microvascular ischemic change. No acute intracranial hemorrhage. No mass effect or midline shift. Vascular: No hyperdense vessel or unexpected calcification. Atherosclerotic calcifications of the bilateral carotid siphon. Skull: Normal. Negative for fracture or focal lesion. Sinuses/Orbits: Mild mucosal thickening in the ethmoid sinuses. Other: None. CT CERVICAL SPINE FINDINGS Alignment: Straightening normal cervical lordosis, likely positional. Skull base and vertebrae: No acute fracture. No primary bone lesion or focal pathologic process. Mineralized pannus posterior to the dens. Soft tissues and spinal canal: No prevertebral fluid or swelling. No visible canal hematoma. Disc levels: Multilevel degenerative changes of the cervical spine most significant from C5-C7. Upper chest: Negative. Other: None IMPRESSION: 1. No acute intracranial findings. 2. No acute fracture or subluxation of the cervical spine. 3. Multilevel degenerative changes of the cervical spine most significant from C5-C7. Electronically Signed   By: Dahlia Bailiff MD   On: 08/23/2020 17:48   CT Cervical Spine Wo Contrast  Result Date: 08/23/2020 CLINICAL DATA:  Fall home today.  COVID positive EXAM: CT HEAD WITHOUT CONTRAST CT CERVICAL SPINE WITHOUT CONTRAST TECHNIQUE: Multidetector CT imaging of the head and cervical spine was performed following the standard protocol without intravenous contrast. Multiplanar CT image reconstructions of the cervical spine were also generated. COMPARISON:  CT January 22, 2019. FINDINGS: CT HEAD FINDINGS Brain: Unchanged mild age related atrophy with ex vacuo dilatation of the ventricular system. Similar microvascular ischemic change. No acute intracranial hemorrhage. No mass effect or midline shift. Vascular: No hyperdense vessel or unexpected  calcification. Atherosclerotic calcifications of the bilateral carotid siphon. Skull: Normal. Negative for fracture or focal lesion. Sinuses/Orbits: Mild mucosal thickening in the ethmoid sinuses. Other: None. CT CERVICAL SPINE FINDINGS Alignment: Straightening normal cervical lordosis, likely positional. Skull base and vertebrae: No acute fracture. No primary bone lesion or focal pathologic process. Mineralized pannus posterior to the dens. Soft tissues and spinal canal: No prevertebral fluid or swelling. No visible canal hematoma. Disc levels: Multilevel degenerative changes of the cervical spine most significant from C5-C7. Upper chest: Negative. Other: None IMPRESSION: 1. No acute intracranial findings. 2. No acute fracture or subluxation of the cervical spine. 3. Multilevel degenerative changes of the cervical spine most significant from C5-C7. Electronically Signed   By: Dahlia Bailiff MD   On: 08/23/2020 17:48   CT ABDOMEN PELVIS W CONTRAST  Result Date: 08/23/2020 CLINICAL DATA:  COVID positive. Abdominal distension. EXAM: CT ABDOMEN AND PELVIS WITH CONTRAST TECHNIQUE: Multidetector CT imaging of the abdomen and pelvis was performed using the standard protocol following bolus administration of intravenous contrast. CONTRAST:  163mL OMNIPAQUE IOHEXOL 300 MG/ML  SOLN COMPARISON:  Dec 07, 2018 FINDINGS: Lower chest: Bilateral predominantly peripheral and subpleural areas of ground-glass and patchy consolidation, consistent with COVID pneumonia. Mildly enlarged heart. Partially visualized cardiac pacemaker leads. Hepatobiliary: No focal liver abnormality is seen. Status post cholecystectomy. No biliary dilatation. Pancreas: Unremarkable. No pancreatic ductal dilatation or surrounding inflammatory changes. Spleen: Normal in size without focal  abnormality. Adrenals/Urinary Tract: Multilobulated complicated cyst off of the lower pole of the right kidney measures 4.2 cm. Mild perinephric stranding bilaterally. No  evidence of hydronephrosis 3 mm nonobstructive calculus in the midpole region of the left kidney. Normal adrenal glands. Stomach/Bowel: Normal appearance of the stomach. 3.7 cm duodenal diverticulum off of the first portion of the duodenum. Scattered left colonic diverticulosis without evidence of diverticulitis. Vascular/Lymphatic: Aortic atherosclerosis. No enlarged abdominal or pelvic lymph nodes. Reproductive: Mild enlargement of the prostate gland, which measures 4 cm transversely. Other: No abdominal wall hernia or abnormality. No abdominopelvic ascites. Musculoskeletal: Diffuse spondylosis of the lumbosacral spine. IMPRESSION: 1. Bilateral predominantly peripheral and subpleural areas of ground-glass and patchy consolidation, consistent with COVID pneumonia. 2. No evidence of acute abnormalities within the abdomen or pelvis. 3. 3 mm nonobstructive left renal calculus. 4. Multilobulated complicated cyst off of the lower pole of the right kidney measures 4.2 cm. 5. Scattered left colonic diverticulosis without evidence of diverticulitis. 6. Mild enlargement of the prostate gland. Please correlate to serum PSA values. 7. Aortic atherosclerosis. Aortic Atherosclerosis (ICD10-I70.0). Electronically Signed   By: Fidela Salisbury M.D.   On: 08/23/2020 17:48   DG Chest Port 1 View  Result Date: 08/23/2020 CLINICAL DATA:  Cough. EXAM: PORTABLE CHEST 1 VIEW COMPARISON:  March 10, 2018. FINDINGS: The heart size and mediastinal contours are within normal limits. Sternotomy wires are noted. Left-sided pacemaker is unchanged in position. No pneumothorax or pleural effusion is noted. Lungs are clear. The visualized skeletal structures are unremarkable. IMPRESSION: No active disease. Electronically Signed   By: Marijo Conception M.D.   On: 08/23/2020 15:30    Procedures Procedures   Medications Ordered in ED Medications  remdesivir 200 mg in sodium chloride 0.9% 250 mL IVPB (0 mg Intravenous Stopped 08/23/20 1844)     Followed by  remdesivir 100 mg in sodium chloride 0.9 % 100 mL IVPB (has no administration in time range)  sodium chloride 0.9 % bolus 1,000 mL (0 mLs Intravenous Stopped 08/23/20 1747)  acetaminophen (TYLENOL) tablet 650 mg (650 mg Oral Given 08/23/20 1551)  iohexol (OMNIPAQUE) 300 MG/ML solution 100 mL (100 mLs Intravenous Contrast Given 08/23/20 1717)    ED Course  I have reviewed the triage vital signs and the nursing notes.  Pertinent labs & imaging results that were available during my care of the patient were reviewed by me and considered in my medical decision making (see chart for details).    MDM Rules/Calculators/A&P                         Paul Frazier. is a 87 y.o. male here presenting with low-grade temp, cough, well nausea and vomiting. Likely viral gastroenteritis versus pneumonia versus Covid. Plan to get CBC, CMP, lactate, cultures, chest x-ray, CT abdomen pelvis, Covid test, urinalysis.    7:26 PM Patient is Covid positive.  Patient did not require any oxygen.  I ordered remdesivir for weakness.  Patient has mild rhabdomyolysis with CK of 600.  Patient's CT head neck is unremarkable.  CT abdomen pelvis showed likely Covid and no intra-abdominal process.  Patient was also given IV fluids and patient will be admitted for COVID with weakness.  Final Clinical Impression(s) / ED Diagnoses Final diagnoses:  None    Rx / DC Orders ED Discharge Orders    None       Drenda Freeze, MD 08/23/20 Sharilyn Sites

## 2020-08-24 DIAGNOSIS — Z881 Allergy status to other antibiotic agents status: Secondary | ICD-10-CM | POA: Diagnosis not present

## 2020-08-24 DIAGNOSIS — I429 Cardiomyopathy, unspecified: Secondary | ICD-10-CM | POA: Diagnosis present

## 2020-08-24 DIAGNOSIS — Z888 Allergy status to other drugs, medicaments and biological substances status: Secondary | ICD-10-CM | POA: Diagnosis not present

## 2020-08-24 DIAGNOSIS — Z87891 Personal history of nicotine dependence: Secondary | ICD-10-CM | POA: Diagnosis not present

## 2020-08-24 DIAGNOSIS — D75839 Thrombocytosis, unspecified: Secondary | ICD-10-CM | POA: Diagnosis present

## 2020-08-24 DIAGNOSIS — E785 Hyperlipidemia, unspecified: Secondary | ICD-10-CM | POA: Diagnosis present

## 2020-08-24 DIAGNOSIS — G250 Essential tremor: Secondary | ICD-10-CM | POA: Diagnosis present

## 2020-08-24 DIAGNOSIS — Z7901 Long term (current) use of anticoagulants: Secondary | ICD-10-CM | POA: Diagnosis not present

## 2020-08-24 DIAGNOSIS — E860 Dehydration: Secondary | ICD-10-CM | POA: Diagnosis present

## 2020-08-24 DIAGNOSIS — I110 Hypertensive heart disease with heart failure: Secondary | ICD-10-CM | POA: Diagnosis present

## 2020-08-24 DIAGNOSIS — I5042 Chronic combined systolic (congestive) and diastolic (congestive) heart failure: Secondary | ICD-10-CM | POA: Diagnosis present

## 2020-08-24 DIAGNOSIS — M1990 Unspecified osteoarthritis, unspecified site: Secondary | ICD-10-CM | POA: Diagnosis present

## 2020-08-24 DIAGNOSIS — U071 COVID-19: Secondary | ICD-10-CM | POA: Diagnosis present

## 2020-08-24 DIAGNOSIS — E441 Mild protein-calorie malnutrition: Secondary | ICD-10-CM | POA: Diagnosis present

## 2020-08-24 DIAGNOSIS — R748 Abnormal levels of other serum enzymes: Secondary | ICD-10-CM | POA: Diagnosis present

## 2020-08-24 DIAGNOSIS — I2510 Atherosclerotic heart disease of native coronary artery without angina pectoris: Secondary | ICD-10-CM | POA: Diagnosis present

## 2020-08-24 DIAGNOSIS — Z808 Family history of malignant neoplasm of other organs or systems: Secondary | ICD-10-CM | POA: Diagnosis not present

## 2020-08-24 DIAGNOSIS — I700 Atherosclerosis of aorta: Secondary | ICD-10-CM | POA: Diagnosis present

## 2020-08-24 DIAGNOSIS — I4892 Unspecified atrial flutter: Secondary | ICD-10-CM | POA: Diagnosis present

## 2020-08-24 DIAGNOSIS — F325 Major depressive disorder, single episode, in full remission: Secondary | ICD-10-CM | POA: Diagnosis present

## 2020-08-24 DIAGNOSIS — J1282 Pneumonia due to coronavirus disease 2019: Secondary | ICD-10-CM | POA: Diagnosis present

## 2020-08-24 DIAGNOSIS — I252 Old myocardial infarction: Secondary | ICD-10-CM | POA: Diagnosis not present

## 2020-08-24 DIAGNOSIS — M6282 Rhabdomyolysis: Secondary | ICD-10-CM | POA: Diagnosis present

## 2020-08-24 DIAGNOSIS — Z951 Presence of aortocoronary bypass graft: Secondary | ICD-10-CM | POA: Diagnosis not present

## 2020-08-24 LAB — CBC WITH DIFFERENTIAL/PLATELET
Abs Immature Granulocytes: 0.08 10*3/uL — ABNORMAL HIGH (ref 0.00–0.07)
Basophils Absolute: 0 10*3/uL (ref 0.0–0.1)
Basophils Relative: 0 %
Eosinophils Absolute: 0 10*3/uL (ref 0.0–0.5)
Eosinophils Relative: 0 %
HCT: 41.4 % (ref 39.0–52.0)
Hemoglobin: 13.8 g/dL (ref 13.0–17.0)
Immature Granulocytes: 1 %
Lymphocytes Relative: 8 %
Lymphs Abs: 0.7 10*3/uL (ref 0.7–4.0)
MCH: 32.2 pg (ref 26.0–34.0)
MCHC: 33.3 g/dL (ref 30.0–36.0)
MCV: 96.5 fL (ref 80.0–100.0)
Monocytes Absolute: 0.5 10*3/uL (ref 0.1–1.0)
Monocytes Relative: 6 %
Neutro Abs: 7.6 10*3/uL (ref 1.7–7.7)
Neutrophils Relative %: 85 %
Platelets: 142 10*3/uL — ABNORMAL LOW (ref 150–400)
RBC: 4.29 MIL/uL (ref 4.22–5.81)
RDW: 14.3 % (ref 11.5–15.5)
WBC: 9 10*3/uL (ref 4.0–10.5)
nRBC: 0 % (ref 0.0–0.2)

## 2020-08-24 LAB — COMPREHENSIVE METABOLIC PANEL
ALT: 28 U/L (ref 0–44)
AST: 34 U/L (ref 15–41)
Albumin: 3.1 g/dL — ABNORMAL LOW (ref 3.5–5.0)
Alkaline Phosphatase: 40 U/L (ref 38–126)
Anion gap: 9 (ref 5–15)
BUN: 24 mg/dL — ABNORMAL HIGH (ref 8–23)
CO2: 26 mmol/L (ref 22–32)
Calcium: 7.8 mg/dL — ABNORMAL LOW (ref 8.9–10.3)
Chloride: 101 mmol/L (ref 98–111)
Creatinine, Ser: 1 mg/dL (ref 0.61–1.24)
GFR, Estimated: >60 mL/min (ref >60)
Glucose, Bld: 95 mg/dL (ref 70–99)
Potassium: 3.9 mmol/L (ref 3.5–5.1)
Sodium: 136 mmol/L (ref 135–145)
Total Bilirubin: 0.9 mg/dL (ref 0.3–1.2)
Total Protein: 5.7 g/dL — ABNORMAL LOW (ref 6.5–8.1)

## 2020-08-24 LAB — MAGNESIUM: Magnesium: 1.9 mg/dL (ref 1.7–2.4)

## 2020-08-24 LAB — FERRITIN: Ferritin: 114 ng/mL (ref 24–336)

## 2020-08-24 LAB — C-REACTIVE PROTEIN: CRP: 7.7 mg/dL — ABNORMAL HIGH (ref <1.0)

## 2020-08-24 LAB — CK: Total CK: 762 U/L — ABNORMAL HIGH (ref 49–397)

## 2020-08-24 LAB — D-DIMER, QUANTITATIVE: D-Dimer, Quant: 0.59 ug/mL-FEU — ABNORMAL HIGH (ref 0.00–0.50)

## 2020-08-24 LAB — PHOSPHORUS: Phosphorus: 1.5 mg/dL — ABNORMAL LOW (ref 2.5–4.6)

## 2020-08-24 MED ORDER — SODIUM PHOSPHATES 45 MMOLE/15ML IV SOLN
20.0000 mmol | Freq: Once | INTRAVENOUS | Status: AC
  Administered 2020-08-24: 20 mmol via INTRAVENOUS
  Filled 2020-08-24: qty 6.67

## 2020-08-24 NOTE — Plan of Care (Signed)
  Problem: Education: Goal: Knowledge of risk factors and measures for prevention of condition will improve Outcome: Progressing   Problem: Coping: Goal: Psychosocial and spiritual needs will be supported Outcome: Progressing   Problem: Respiratory: Goal: Will maintain a patent airway Outcome: Progressing Goal: Complications related to the disease process, condition or treatment will be avoided or minimized Outcome: Progressing   Problem: Elimination: Goal: Will not experience complications related to bowel motility Outcome: Progressing   Problem: Safety: Goal: Ability to remain free from injury will improve Outcome: Progressing

## 2020-08-24 NOTE — Progress Notes (Signed)
PROGRESS NOTE    Patient: Paul Frazier.                            PCP: Unk Pinto, MD                    DOB: 1933/02/09            DOA: 08/23/2020 IZT:[id number]             DOS: 08/24/2020, 7:05 AM   LOS: 0 days   Date of Service: The patient was seen and examined on 08/24/2020  Subjective:   The patient was seen and examined this morning. Stable at this time.  Satting 94% on 2 L of oxygen Still complaining of: Severe generalized weaknesses, cough and shortness of breath with exertion Otherwise no issues overnight .  Brief Narrative:   Dixon Luczak. is a 87 y.o. male with medical history significant of osteoarthritis, coronary artery disease, history of MI, CABG times fine, hypertension, hyperlipidemia, cholelithiasis, right inguinal hernia, essential tremor who is coming to the emergency department with complaints of progressively worse weakness associated with 7-8 episodes of emesis and one episode of loose stools (after he took 2 tablets of azithromycin 250 mg yesterday) for the past 2 to 3 days.  He also endorses sore throat and productive cough of "creamy-looking" sputum.     He has not been vaccinated with any other COVID-19 vaccines.   The patient may have been taking a dexamethasone taper for the past few days.  ED Course:  Initial vital signs were temperature 100.4 F, pulse 66, respirations 17, blood pressure 109/49 mmHg and O2 sat 94% on room air.  The patient received  CT abdomen/pelvis with contrast showed bilateral predominantly peripheral and subpleural areas of groundglass and patchy consolidation consistent with COVID pneumonia. SARS coronavirus two by PCR was positive.    Assessment & Plan:   Principal Problem:   Pneumonia due to COVID-19 virus Active Problems:   CAD (coronary artery disease)   Hyperlipidemia   Essential hypertension   Major depression in full remission ([place])   Atherosclerosis of aorta (HCC)   Paroxysmal atrial flutter  (HCC)   Chronic combined systolic and diastolic heart failure (HCC)   Mild protein malnutrition (HCC)   Elevated CK   Thrombocytosis    Pneumonia due to COVID-19 virus -Continue observation -Encouraging ambulation, PT consultation -Continue O2 supplements, currently on 2 L of oxygen satting 94% -Continue inhaled bronchodilators as needed -Flutter valve incentive spirometry. Prone positioning as tolerated PRN. Antitussive as needed. Antiemetic as needed. Remdesivir per pharmacy... Has been initiated, will continue Solu-Medrol 0.5 mg/kg every 12 hours... Tolerating steroids will continue We will follow  CBC, CMP and inflammatory markers.  Active Problems:   Elevated CK -Mildly volume depleted, dehydrated We will continue with gentle/time-limited IV hydration... Mindful of congestive heart failure Urine alkalinization. Elevated CK .[name] Monitoring kidney function, withholding statins for now    CAD (coronary artery disease) -We will continue home medications of Apixaban, statin and beta-blocker.    Essential hypertension Currently stable -Continue home medication of losartan  Depression Continue bupropion 300 mg p.o. daily. Continue low-dose diazepam as needed for anxiety. Remained stable    Paroxysmal atrial flutter (HCC) CHA?DS?-VASc Score of at least five. Continue apixaban 5 mg p.o. twice daily. Continue metoprolol succinate for rate control. Currently stable   Chronic combined systolic and diastolic heart failure (HCC) No signs  of decompensation, mildly volume depleted, dehydrated. Hold furosemide-for now we will resume quickly Monitor intake and output. Continue losartan 12.5 mg p.o. daily. Continue metoprolol 25 mg p.o. daily. Monitor BP, HR, renal function electrolytes.    Hyperlipidemia - Atherosclerosis of aorta (HCC) -Withholding atorvastatin 40 mg p.o. daily .[name] Due to elevated CK.    Mild protein malnutrition (HCC) Protein  supplementation. Consult nutritional services.  Thrombocytosis In the setting of viral infection. Monitor H&H. Stable    DVT prophylaxis:      On apixaban  Code Status:              Full code. Family Communication: None present at bedside, will try to reach his son, POA Disposition Plan:              Patient is from:                        Home.             Anticipated DC to:                   Home.             Anticipated DC date:                Back home in 1 to 2 days?  With home health             Anticipated DC barriers:         Clinical status.           Consults called: Admission status:     Observation/telemetry.   Level of care: Telemetry      ------------------------------------------------------------------------------------------------------------------------------------ Nutritional status:  The patient's BMI is: There is no height or weight on file to calculate BMI. I agree with the assessment and plan as outlined below: ------------------------------------------------------------------------------------------------------------------------------------ Cultures; Blood Cultures x 2 Antimicrobials:  Consultants: None    Procedures:   No admission procedures for hospital encounter.     Antimicrobials:  Anti-infectives (From admission, onward)   Start     Dose/Rate Route Frequency Ordered Stop   08/24/20 1000  remdesivir 100 mg in sodium chloride 0.9 % 100 mL IVPB       "Followed by" Linked Group Details   100 mg 200 mL/hr over 30 Minutes Intravenous Daily 08/23/20 1654 08/28/20 0959   08/24/20 1000  azithromycin (ZITHROMAX) tablet 250 mg  Status:  Discontinued       Note to Pharmacy: OP [id number] 2 tablets with Food on  Day 1, then 1 tablet Daily with Food for Sinusitis / Bronchitis Patient taking differently: Take 2 tablets (500 mg) by mouth on day 1 with food, take 1 tablet (250 mg) on days 2-5 -  for Sinusitis / Bronchitis     250 mg Oral See  admin instructions 08/23/20 2327 08/24/20 0008   08/23/20 1700  remdesivir 200 mg in sodium chloride 0.9% 250 mL IVPB       "Followed by" Linked Group Details   200 mg 580 mL/hr over 30 Minutes Intravenous Once 08/23/20 1654 08/23/20 1844       Medication:  . apixaban  5 mg Oral BID  . vitamin C  500 mg Oral Daily  . atorvastatin  40 mg Oral Daily  . buPROPion  300 mg Oral Daily  . cholecalciferol  5,000 Units Oral Daily  . losartan  12.5 mg Oral Daily  . methylPREDNISolone (SOLU-MEDROL) injection  40  mg Intravenous Q12H   Followed by  . [START ON 08/27/2020] predniSONE  50 mg Oral Daily  . metoprolol succinate  25 mg Oral Daily  . potassium chloride  10 mEq Oral BID  . zinc sulfate  220 mg Oral Daily    acetaminophen **OR** acetaminophen, albuterol, chlorpheniramine-HYDROcodone, diazepam, gabapentin, guaiFENesin-dextromethorphan, nitroGLYCERIN, prochlorperazine   Objective:   Vitals:   08/23/20 2115 08/23/20 2258 08/24/20 0305 08/24/20 0559  BP: (!) 123/58 128/66 (!) 127/58 128/63  Pulse: 61 62 68 69  Resp: 19 16 16  (!) 24  Temp:  98.5 F (36.9 C) (!) 100.5 F (38.1 C) 99.2 F (37.3 C)  TempSrc:  Oral Oral Oral  SpO2: 94% 98% 98% 94%    Intake/Output Summary (Last 24 hours) at 08/24/2020 [id number] Last data filed at 08/23/2020 1844 Gross per 24 hour  Intake 1794.17 ml  Output --  Net 1794.17 ml   There were no vitals filed for this visit.   Examination:   Physical Exam  Constitution:  Alert, cooperative, no distress,  Appears calm and comfortable  Psychiatric: Normal and stable mood and affect, cognition intact,   HEENT: Normocephalic, PERRL, otherwise with in Normal limits  Chest:Chest symmetric Cardio vascular:  S1/S2, RRR, No murmure, No Rubs or Gallops  pulmonary: Clear to auscultation bilaterally, respirations unlabored, negative wheezes / crackles Abdomen: Soft, non-tender, non-distended, bowel sounds,no masses, no organomegaly Muscular skeletal:  Generalized  weaknesses Limited exam - in bed, able to move all 4 extremities, Normal strength,  Neuro: CNII-XII intact. , normal motor and sensation, reflexes intact  Extremities: No pitting edema lower extremities, +2 pulses  Skin: Dry, warm to touch, negative for any Rashes, No open wounds Wounds: per nursing documentation    ------------------------------------------------------------------------------------------------------------------------------------------    LABs:  CBC Latest Ref Rng & Units 08/24/2020 08/23/2020 05/28/2020  WBC 4.0 - 10.5 K/uL 9.0 9.7 10.0  Hemoglobin 13.0 - 17.0 g/dL 13.8 14.3 15.5  Hematocrit 39.0 - 52.0 % 41.4 42.4 45.9  Platelets 150 - 400 K/uL 142(L) 142(L) 211   CMP Latest Ref Rng & Units 08/24/2020 08/23/2020 05/28/2020  Glucose 70 - 99 mg/dL 95 98 83  BUN 8 - 23 mg/dL 24(H) 26(H) 19  Creatinine 0.61 - 1.24 mg/dL 1.00 1.03 1.11  Sodium 135 - 145 mmol/L 136 138 141  Potassium 3.5 - 5.1 mmol/L 3.9 3.7 4.9  Chloride 98 - 111 mmol/L 101 103 103  CO2 22 - 32 mmol/L 26 24 32  Calcium 8.9 - 10.3 mg/dL 7.8(L) 8.0(L) 9.8  Total Protein 6.5 - 8.1 g/dL 5.7(L) 6.0(L) 6.8  Total Bilirubin 0.3 - 1.2 mg/dL 0.9 1.0 0.5  Alkaline Phos 38 - 126 U/L 40 45 -  AST 15 - 41 U/L 34 29 22  ALT 0 - 44 U/L 28 29 23        Micro Results Recent Results (from the past 240 hour(s))  SARS Coronavirus 2 by RT PCR (hospital order, performed in [facility] hospital lab) Nasopharyngeal Nasopharyngeal Swab     Status: Abnormal   Collection Time: 08/23/20  3:28 PM   Specimen: Nasopharyngeal Swab  Result Value Ref Range Status   SARS Coronavirus 2 POSITIVE (A) NEGATIVE Final    Comment: RESULT CALLED TO, READ BACK BY AND VERIFIED WITH: SAVOIE,B. RN AT [id number] 08/23/20 MULLINS,T (NOTE) SARS-CoV-2 target nucleic acids are DETECTED  SARS-CoV-2 RNA is generally detectable in upper respiratory specimens  during the acute phase of infection.  Positive results are indicative  of the presence of  the  identified virus, but do not rule out bacterial infection or co-infection with other pathogens not detected by the test.  Clinical correlation with patient history and  other diagnostic information is necessary to determine patient infection status.  The expected result is negative.  Fact Sheet for Patients:   StrictlyIdeas.no   Fact Sheet for Healthcare Providers:   BankingDealers.co.za    This test is not yet approved or cleared by the [place] FDA and  has been authorized for detection and/or diagnosis of SARS-CoV-2 by FDA under an Emergency Use Authorization (EUA).  This EUA will remain in effect (meaning th is test can be used) for the duration of  the COVID-19 declaration under Section 564(b)(1) of the Act, 21 U.S.C. section 360-bbb-3(b)(1), unless the authorization is terminated or revoked sooner.  Performed at [facility], [place] [address] [place], [place] [address]     Radiology Reports CT Head Wo Contrast  Result Date: 08/23/2020 CLINICAL DATA:  Fall home today.  COVID positive EXAM: CT HEAD WITHOUT CONTRAST CT CERVICAL SPINE WITHOUT CONTRAST TECHNIQUE: Multidetector CT imaging of the head and cervical spine was performed following the standard protocol without intravenous contrast. Multiplanar CT image reconstructions of the cervical spine were also generated. COMPARISON:  CT January 22, 2019. FINDINGS: CT HEAD FINDINGS Brain: Unchanged mild age related atrophy with ex vacuo dilatation of the ventricular system. Similar microvascular ischemic change. No acute intracranial hemorrhage. No mass effect or midline shift. Vascular: No hyperdense vessel or unexpected calcification. Atherosclerotic calcifications of the bilateral carotid siphon. Skull: Normal. Negative for fracture or focal lesion. Sinuses/Orbits: Mild mucosal thickening in the ethmoid sinuses. Other: None. CT CERVICAL SPINE FINDINGS Alignment:  Straightening normal cervical lordosis, likely positional. Skull base and vertebrae: No acute fracture. No primary bone lesion or focal pathologic process. Mineralized pannus posterior to the dens. Soft tissues and spinal canal: No prevertebral fluid or swelling. No visible canal hematoma. Disc levels: Multilevel degenerative changes of the cervical spine most significant from C5-C7. Upper chest: Negative. Other: None IMPRESSION: 1. No acute intracranial findings. 2. No acute fracture or subluxation of the cervical spine. 3. Multilevel degenerative changes of the cervical spine most significant from C5-C7. Electronically Signed   By: Dahlia Bailiff MD   On: 08/23/2020 17:48   CT Cervical Spine Wo Contrast  Result Date: 08/23/2020 CLINICAL DATA:  Fall home today.  COVID positive EXAM: CT HEAD WITHOUT CONTRAST CT CERVICAL SPINE WITHOUT CONTRAST TECHNIQUE: Multidetector CT imaging of the head and cervical spine was performed following the standard protocol without intravenous contrast. Multiplanar CT image reconstructions of the cervical spine were also generated. COMPARISON:  CT January 22, 2019. FINDINGS: CT HEAD FINDINGS Brain: Unchanged mild age related atrophy with ex vacuo dilatation of the ventricular system. Similar microvascular ischemic change. No acute intracranial hemorrhage. No mass effect or midline shift. Vascular: No hyperdense vessel or unexpected calcification. Atherosclerotic calcifications of the bilateral carotid siphon. Skull: Normal. Negative for fracture or focal lesion. Sinuses/Orbits: Mild mucosal thickening in the ethmoid sinuses. Other: None. CT CERVICAL SPINE FINDINGS Alignment: Straightening normal cervical lordosis, likely positional. Skull base and vertebrae: No acute fracture. No primary bone lesion or focal pathologic process. Mineralized pannus posterior to the dens. Soft tissues and spinal canal: No prevertebral fluid or swelling. No visible canal hematoma. Disc levels: Multilevel  degenerative changes of the cervical spine most significant from C5-C7. Upper chest: Negative. Other: None IMPRESSION: 1. No acute intracranial findings. 2. No acute fracture or subluxation of  the cervical spine. 3. Multilevel degenerative changes of the cervical spine most significant from C5-C7. Electronically Signed   By: Dahlia Bailiff MD   On: 08/23/2020 17:48   CT ABDOMEN PELVIS W CONTRAST  Result Date: 08/23/2020 CLINICAL DATA:  COVID positive. Abdominal distension. EXAM: CT ABDOMEN AND PELVIS WITH CONTRAST TECHNIQUE: Multidetector CT imaging of the abdomen and pelvis was performed using the standard protocol following bolus administration of intravenous contrast. CONTRAST:  142mL OMNIPAQUE IOHEXOL 300 MG/ML  SOLN COMPARISON:  Dec 07, 2018 FINDINGS: Lower chest: Bilateral predominantly peripheral and subpleural areas of ground-glass and patchy consolidation, consistent with COVID pneumonia. Mildly enlarged heart. Partially visualized cardiac pacemaker leads. Hepatobiliary: No focal liver abnormality is seen. Status post cholecystectomy. No biliary dilatation. Pancreas: Unremarkable. No pancreatic ductal dilatation or surrounding inflammatory changes. Spleen: Normal in size without focal abnormality. Adrenals/Urinary Tract: Multilobulated complicated cyst off of the lower pole of the right kidney measures 4.2 cm. Mild perinephric stranding bilaterally. No evidence of hydronephrosis 3 mm nonobstructive calculus in the midpole region of the left kidney. Normal adrenal glands. Stomach/Bowel: Normal appearance of the stomach. 3.7 cm duodenal diverticulum off of the first portion of the duodenum. Scattered left colonic diverticulosis without evidence of diverticulitis. Vascular/Lymphatic: Aortic atherosclerosis. No enlarged abdominal or pelvic lymph nodes. Reproductive: Mild enlargement of the prostate gland, which measures 4 cm transversely. Other: No abdominal wall hernia or abnormality. No abdominopelvic  ascites. Musculoskeletal: Diffuse spondylosis of the lumbosacral spine. IMPRESSION: 1. Bilateral predominantly peripheral and subpleural areas of ground-glass and patchy consolidation, consistent with COVID pneumonia. 2. No evidence of acute abnormalities within the abdomen or pelvis. 3. 3 mm nonobstructive left renal calculus. 4. Multilobulated complicated cyst off of the lower pole of the right kidney measures 4.2 cm. 5. Scattered left colonic diverticulosis without evidence of diverticulitis. 6. Mild enlargement of the prostate gland. Please correlate to serum PSA values. 7. Aortic atherosclerosis. Aortic Atherosclerosis (ICD10-I70.0). Electronically Signed   By: Fidela Salisbury M.D.   On: 08/23/2020 17:48   DG Chest Port 1 View  Result Date: 08/23/2020 CLINICAL DATA:  Cough. EXAM: PORTABLE CHEST 1 VIEW COMPARISON:  March 10, 2018. FINDINGS: The heart size and mediastinal contours are within normal limits. Sternotomy wires are noted. Left-sided pacemaker is unchanged in position. No pneumothorax or pleural effusion is noted. Lungs are clear. The visualized skeletal structures are unremarkable. IMPRESSION: No active disease. Electronically Signed   By: Marijo Conception M.D.   On: 08/23/2020 15:30    SIGNED: Deatra James, MD, FHM. [facility],  Pager (please use [email or web address] to page/text) Please use Epic Secure Chat for non-urgent communication (7AM-7PM)  If 7PM-7AM, please contact night-coverage [email or web address], 08/24/2020, 7:05 AM

## 2020-08-25 DIAGNOSIS — U071 COVID-19: Principal | ICD-10-CM

## 2020-08-25 DIAGNOSIS — J1282 Pneumonia due to coronavirus disease 2019: Secondary | ICD-10-CM

## 2020-08-25 LAB — CBC WITH DIFFERENTIAL/PLATELET
Abs Immature Granulocytes: 0.09 10*3/uL — ABNORMAL HIGH (ref 0.00–0.07)
Basophils Absolute: 0 10*3/uL (ref 0.0–0.1)
Basophils Relative: 0 %
Eosinophils Absolute: 0 10*3/uL (ref 0.0–0.5)
Eosinophils Relative: 0 %
HCT: 41.8 % (ref 39.0–52.0)
Hemoglobin: 13.8 g/dL (ref 13.0–17.0)
Immature Granulocytes: 1 %
Lymphocytes Relative: 8 %
Lymphs Abs: 0.8 10*3/uL (ref 0.7–4.0)
MCH: 31.9 pg (ref 26.0–34.0)
MCHC: 33 g/dL (ref 30.0–36.0)
MCV: 96.8 fL (ref 80.0–100.0)
Monocytes Absolute: 0.7 10*3/uL (ref 0.1–1.0)
Monocytes Relative: 7 %
Neutro Abs: 8.6 10*3/uL — ABNORMAL HIGH (ref 1.7–7.7)
Neutrophils Relative %: 84 %
Platelets: 160 10*3/uL (ref 150–400)
RBC: 4.32 MIL/uL (ref 4.22–5.81)
RDW: 13.8 % (ref 11.5–15.5)
WBC: 10.3 10*3/uL (ref 4.0–10.5)
nRBC: 0 % (ref 0.0–0.2)

## 2020-08-25 LAB — COMPREHENSIVE METABOLIC PANEL
ALT: 28 U/L (ref 0–44)
AST: 27 U/L (ref 15–41)
Albumin: 3 g/dL — ABNORMAL LOW (ref 3.5–5.0)
Alkaline Phosphatase: 38 U/L (ref 38–126)
Anion gap: 9 (ref 5–15)
BUN: 24 mg/dL — ABNORMAL HIGH (ref 8–23)
CO2: 25 mmol/L (ref 22–32)
Calcium: 8 mg/dL — ABNORMAL LOW (ref 8.9–10.3)
Chloride: 99 mmol/L (ref 98–111)
Creatinine, Ser: 0.92 mg/dL (ref 0.61–1.24)
GFR, Estimated: >60 mL/min (ref >60)
Glucose, Bld: 172 mg/dL — ABNORMAL HIGH (ref 70–99)
Potassium: 4.1 mmol/L (ref 3.5–5.1)
Sodium: 133 mmol/L — ABNORMAL LOW (ref 135–145)
Total Bilirubin: 0.9 mg/dL (ref 0.3–1.2)
Total Protein: 5.5 g/dL — ABNORMAL LOW (ref 6.5–8.1)

## 2020-08-25 LAB — URINE CULTURE

## 2020-08-25 LAB — MAGNESIUM: Magnesium: 1.9 mg/dL (ref 1.7–2.4)

## 2020-08-25 LAB — C-REACTIVE PROTEIN: CRP: 7.3 mg/dL — ABNORMAL HIGH (ref <1.0)

## 2020-08-25 LAB — D-DIMER, QUANTITATIVE: D-Dimer, Quant: 0.74 ug/mL-FEU — ABNORMAL HIGH (ref 0.00–0.50)

## 2020-08-25 LAB — PHOSPHORUS: Phosphorus: 2.4 mg/dL — ABNORMAL LOW (ref 2.5–4.6)

## 2020-08-25 LAB — FERRITIN: Ferritin: 151 ng/mL (ref 24–336)

## 2020-08-25 NOTE — TOC Initial Note (Addendum)
Transition of Care (TOC) - Initial/Assessment Note    Patient Details  Name: Paul Frazier. MRN: [id number] Date of Birth: May 31, 1933  Transition of Care [facility]) CM/SW Contact:    Trish Mage, LCSW Phone Number: 08/25/2020, 4:26 PM  Clinical Narrative:    Patiwent seen in follow up to PT recommendation of [facility].  Paul Frazier states he lives alone, has DME cane, walker and bedside commode, and is open to a referral for [facility].  He has had [facility] in the past, and would prefer them again if they have openings. TOC has left a message for Cindie with [facility].  TOC will continue to follow during the course of hospitalization.  Addendum:  Cindie states they can provide in-home services                Expected Discharge Plan: [place] Barriers to Discharge: No Barriers Identified   Patient Goals and CMS Choice     Choice offered to / list presented to : Patient  Expected Discharge Plan and Services Expected Discharge Plan: [place]   Discharge Planning Services: CM Consult Post Acute Care Choice: [place] arrangements for the past 2 months: Single Family Home                                      Prior Living Arrangements/Services Living arrangements for the past 2 months: Single Family Home Lives with:: Self Patient language and need for interpreter reviewed:: Yes Do you feel safe going back to the place where you live?: Yes      Need for Family Participation in Patient Care: Yes (Comment) Care giver support system in place?: Yes (comment) Current home services: DME Criminal Activity/Legal Involvement Pertinent to Current Situation/Hospitalization: No - Comment as needed  Activities of Daily Living Home Assistive Devices/Equipment: Eyeglasses,Blood pressure cuff,Walker (specify type),Cane (specify quad or straight) (4 wheels walker, straight cane) ADL Screening (condition at time of admission) Patient's cognitive ability  adequate to safely complete daily activities?: Yes Is the patient deaf or have difficulty hearing?: No Does the patient have difficulty seeing, even when wearing glasses/contacts?: No Does the patient have difficulty concentrating, remembering, or making decisions?: No Patient able to express need for assistance with ADLs?: Yes Does the patient have difficulty dressing or bathing?: No Independently performs ADLs?: Yes (appropriate for developmental age) Does the patient have difficulty walking or climbing stairs?: Yes Weakness of Legs: Both Weakness of Arms/Hands: None  Permission Sought/Granted                  Emotional Assessment Appearance:: Appears stated age Attitude/Demeanor/Rapport: Engaged Affect (typically observed): Appropriate Orientation: : Oriented to Self,Oriented to Place,Oriented to  Time,Oriented to Situation Alcohol / Substance Use: Not Applicable Psych Involvement: No (comment)  Admission diagnosis:  Pneumonia due to COVID-19 virus [U07.1, J12.82] COVID-19 [U07.1] COVID-19 virus infection [U07.1] Patient Active Problem List   Diagnosis Date Noted  . COVID-19 virus infection 08/24/2020  . Pneumonia due to COVID-19 virus 08/23/2020  . Mild protein malnutrition ([place]) 08/23/2020  . Elevated CK 08/23/2020  . Thrombocytosis 08/23/2020  . At risk for falls 12/04/2019  . Unsteady gait 12/04/2019  . Acquired thrombophilia ([place]) 07/23/2019  . Chronic combined systolic and diastolic heart failure ([place]) 08/29/2018  . Pacemaker 08/29/2018  . History of transient ischemic attack (TIA) 08/29/2018  . Lumbar pain  06/09/2018  . Senile purpura ([place]) 05/11/2018  . Complete heart block ([place])   . Paroxysmal atrial flutter ([place]) 03/06/2018  . Atherosclerosis of aorta ([place]) 08/23/2016  . History of basal cell cancer 08/01/2015  . Major depression in full remission ([place]) 09/11/2014  . Essential hypertension 01/01/2014  . Vitamin D deficiency 01/01/2014  . Medication  management 01/01/2014  . Other abnormal glucose 01/01/2014  . Nonsustained ventricular tachycardia ([place]) 05/23/2013  . CAD (coronary artery disease) 01/08/2013  . Cardiomyopathy, ischemic 01/08/2013  . Hyperlipidemia 01/08/2013  . LBBB (left bundle branch block) 01/08/2013   PCP:  Unk Pinto, MD Pharmacy:   [place], [place] - [address] [place] [address] Phone: [phone] Fax: [phone]  [facility], [address] [place], [address] [place], [address] [place] [address] Phone: [phone] Fax: [place], [place] - [address] [place] [address] Phone: [phone] Fax: [phone]     Social Determinants of Health (SDOH) Interventions    Readmission Risk Interventions Readmission Risk Prevention Plan 03/10/2018  Post Dischage Appt Complete  Medication Screening Complete  Transportation Screening Complete  PCP follow-up Complete  Some recent data might be hidden

## 2020-08-25 NOTE — Progress Notes (Signed)
PROGRESS NOTE  Paul Frazier.  DOB: 10/10/1932  PCP: Unk Pinto, MD CHE:[id number]  DOA: 08/23/2020  LOS: 1 day   Chief Complaint  Patient presents with  . Nausea  . Weakness   Brief narrative: Paul Reichelt. is a 87 y.o. male with PMH significant for HTN, HLD, CAD-MI-s/p CABG, osteoarthritis, essential tremor.  Patient was brought to the ED on 08/23/2020 with complaints of diarrhea, vomiting, worsening generalized weakness for 2 to 3 days as well as sore throat and productive cough.   In the ED, patient had a temperature of 100.4, breathing on room air. Covid PCR positive. CT abdomen/pelvis with contrast showed bilateral predominantly peripheral and subpleural areas of groundglass and patchy consolidation consistent with COVID pneumonia. Patient was admitted for COVID-19 pneumonia.  Subjective: Patient was seen and examined this morning. Pleasant elderly Caucasian male.  Lying down in bed.  Not in distress. No fever last 24 hours.  On room air. Labs from this morning with improving CRP level.  Assessment/Plan: COVID pneumonia -Presented with predominantly GI symptoms: Nausea, vomiting, diarrhea -COVID test: PCR positive on admission -Chest imaging: Predominantly peripheral and subpleural areas of groundglass and patchy consolidation  -Treatment: Patient is currently on 5-day course of IV remdesivir till 2/9.  On IV Solu-Medrol 40 mg twice daily. -Progression: Patient states he feels better.  Not requiring supplemental oxygen.  CRP initially worsened to 7.7, slightly down to 7.3 today.  Continue to monitor -Oxygen - SpO2: 97 % -Supportive care: Vitamin C, Zinc, PRN inhalers, Tylenol, Antitussives (benzonatate/ Mucinex/Tussionex).   -Encouraged incentive spirometry, prone position, out of bed and early mobilization as much as possible -Continue airborne/contact isolation precautions for duration of 3 weeks from the day of diagnosis. -WBC and inflammatory markers  trend as below. Recent Labs  Lab 08/23/20 1511 08/23/20 1528 08/23/20 1805 08/24/20 0350 08/25/20 0258  SARSCOV2NAA  --  POSITIVE*  --   --   --   WBC 9.7  --   --  9.0 10.3  LATICACIDVEN 1.5  --  1.7  --   --   PROCALCITON <0.10  --   --   --   --   DDIMER 0.55*  --   --  0.59* 0.74*  FERRITIN 99  --   --  114 151  LDH 216*  --   --   --   --   CRP 1.2*  --   --  7.7* 7.3*  ALT 29  --   --  28 28  The treatment plan and use of medications and known side effects were discussed with patient/family. Some of the medications used are based on case reports/anecdotal data.  All other medications being used in the management of COVID-19 based on limited study data.  Complete risks and long-term side effects are unknown, however in the best clinical judgment they seem to be of some benefit.  Patient wanted to proceed with treatment options provided.  Elevated CK -Likely because of dehydration. -CK level is only mildly elevated.  He has been receiving IV fluid.  I would avoid further hydration because it can worsen respiratory status in the setting of Covid pneumonia. Recent Labs  Lab 08/23/20 1511 08/24/20 0350  CKTOTAL 588* 762*   CAD -Continue apixaban 5 mg twice daily, Lipitor 40 mg daily and Toprol 25 mg daily     Paroxysmal atrial flutter -Continue metoprolol and Eliquis. -Continue to monitor in telemetry  Chronic combined systolic and diastolic heart failure Essential  hypertension -Currently stable blood pressure and compensated CHF on metoprolol, losartan.  Lasix on hold. -Continue to monitor blood pressure.  Hyperlipidemia  Atherosclerosis of aorta -Resume Lipitor once CK level improved.  Mild protein malnutrition (HCC) -Protein supplementation. -Nutrition consulted. Depression Continue bupropion 300 mg p.o. daily. Continue low-dose diazepam as needed for anxiety. Remained stable  Mobility: Encourage ambulation Code Status:   Code Status: Full Code   Nutritional status: Body mass index is 27.93 kg/m.     Diet Order            Diet Heart Room service appropriate? Yes; Fluid consistency: Thin  Diet effective now                 DVT prophylaxis:  apixaban (ELIQUIS) tablet 5 mg   Antimicrobials:  None Fluid: Stop IV fluid Consultants: None Family Communication:  None at bedside  Status is: Inpatient  Remains inpatient appropriate because: Getting IV treatment for Covid pneumonia  Dispo: The patient is from: Home              Anticipated d/c is to: Home with home health PT              Anticipated d/c date is: 2 days              Patient currently is not medically stable to d/c.   Difficult to place patient No       Infusions:  . remdesivir 100 mg in NS 100 mL 100 mg (08/25/20 1020)    Scheduled Meds: . apixaban  5 mg Oral BID  . vitamin C  500 mg Oral Daily  . buPROPion  300 mg Oral Daily  . cholecalciferol  5,000 Units Oral Daily  . losartan  12.5 mg Oral Daily  . methylPREDNISolone (SOLU-MEDROL) injection  40 mg Intravenous Q12H   Followed by  . [START ON 08/27/2020] predniSONE  50 mg Oral Daily  . metoprolol succinate  25 mg Oral Daily  . potassium chloride  10 mEq Oral BID  . zinc sulfate  220 mg Oral Daily    Antimicrobials: Anti-infectives (From admission, onward)   Start     Dose/Rate Route Frequency Ordered Stop   08/24/20 1000  remdesivir 100 mg in sodium chloride 0.9 % 100 mL IVPB       "Followed by" Linked Group Details   100 mg 200 mL/hr over 30 Minutes Intravenous Daily 08/23/20 1654 08/28/20 0959   08/24/20 1000  azithromycin (ZITHROMAX) tablet 250 mg  Status:  Discontinued       Note to Pharmacy: OP [id number] 2 tablets with Food on  Day 1, then 1 tablet Daily with Food for Sinusitis / Bronchitis Patient taking differently: Take 2 tablets (500 mg) by mouth on day 1 with food, take 1 tablet (250 mg) on days 2-5 -  for Sinusitis / Bronchitis     250 mg Oral See admin instructions 08/23/20  2327 08/24/20 0008   08/23/20 1700  remdesivir 200 mg in sodium chloride 0.9% 250 mL IVPB       "Followed by" Linked Group Details   200 mg 580 mL/hr over 30 Minutes Intravenous Once 08/23/20 1654 08/23/20 1844      PRN meds: acetaminophen **OR** acetaminophen, albuterol, chlorpheniramine-HYDROcodone, diazepam, gabapentin, guaiFENesin-dextromethorphan, nitroGLYCERIN, prochlorperazine   Objective: Vitals:   08/25/20 0435 08/25/20 1422  BP: (!) 142/64 131/69  Pulse: (!) 59 67  Resp: 20 (!) 21  Temp: 98.7 F (37.1 C) 98 F (36.7 C)  SpO2: 97% 97%    Intake/Output Summary (Last 24 hours) at 08/25/2020 1714 Last data filed at 08/25/2020 1500 Gross per 24 hour  Intake 1956.21 ml  Output -  Net 1956.21 ml   Filed Weights   08/24/20 1347  Weight: 78.5 kg   Weight change:  Body mass index is 27.93 kg/m.   Physical Exam: General exam: Pleasant elderly Caucasian male.  Not in distress Skin: No rashes, lesions or ulcers. HEENT: Atraumatic, normocephalic, no obvious bleeding Lungs: Clear to auscultate bilaterally CVS: Rate controlled, no murmur GI/Abd soft, nontender, nondistended, bowel sound present CNS: Alert, awake, oriented to place and person Psychiatry: Mood appropriate Extremities: No pedal edema, no calf tenderness  Data Review: I have personally reviewed the laboratory data and studies available.  Recent Labs  Lab 08/23/20 1511 08/24/20 0350 08/25/20 0258  WBC 9.7 9.0 10.3  NEUTROABS 7.8* 7.6 8.6*  HGB 14.3 13.8 13.8  HCT 42.4 41.4 41.8  MCV 95.3 96.5 96.8  PLT 142* 142* 160   Recent Labs  Lab 08/23/20 1511 08/24/20 0350 08/25/20 0258  NA 138 136 133*  K 3.7 3.9 4.1  CL 103 101 99  CO2 24 26 25   GLUCOSE 98 95 172*  BUN 26* 24* 24*  CREATININE 1.03 1.00 0.92  CALCIUM 8.0* 7.8* 8.0*  MG  --  1.9 1.9  PHOS  --  1.5* 2.4*    F/u labs ordered.  Signed, Terrilee Croak, MD [facility] 08/25/2020

## 2020-08-25 NOTE — Evaluation (Signed)
Occupational Therapy Evaluation Patient Details Name: Paul Frazier. MRN: [id number] DOB: 07/10/1933 Today's Date: 08/25/2020    History of Present Illness 87 yo male admitted with N/V after falling at home. Dx: Covid. Hx of pacemaker, tremor, CABG, CAD   Clinical Impression   Patient is currently requiring minimal assistance with all ADLs including toileting, LE dressing, bathing, grooming and and feeding, most of which is below patient's typical baseline of being Modified independent however pt does endorse struggles with self feeding due to tremor prior to hospitalization.  During this evaluation, patient was limited by BUE tremor, unsteadiness with functional bathroom mobility, particularly turning in bathroom as well as decreased activity tolerance, all of which has the potential to impact patient's safety and independence during functional mobility, as well as performance for ADLs. [place] "6-clicks" Daily Activity Inpatient Short Form score of 18/24 indicates 46.65% ADL impairment this session. Patient lives alone with a son , who is able to provide PRN supervision and assistance and works during the day for a florist.  Pt reports his son will be very busy this next week due to Valentine's day.  Patient demonstrates good rehab potential, and should benefit from continued skilled occupational therapy services while in acute care to maximize safety, independence and quality of life at home.  Continued occupational therapy services in the home with as much assistance and supervision that son can provide is recommended.  ?   Follow Up Recommendations  Home health OT    Equipment Recommendations  3 in 1 bedside commode    Recommendations for Other Services       Precautions / Restrictions Precautions Precautions: Fall Precaution Comments: Airborn/Contact Restrictions Weight Bearing Restrictions: No      Mobility Bed Mobility Overal bed mobility: Modified  Independent             General bed mobility comments: Pt in chair.    Transfers Overall transfer level: Needs assistance Equipment used: Rolling walker (2 wheeled) Transfers: Sit to/from Stand Sit to Stand: Min guard;Min assist         General transfer comment: Pt stood from recliner to RW with Min guard and from standard commode to RW with [place].  Per pt request, in-room ambulation ~40' with Min guard using RW. SpO2 lowers at 96% on RA during activity and 100% at rest.    Balance Overall balance assessment: Needs assistance;History of Falls Sitting-balance support: Feet supported;Single extremity supported Sitting balance-Leahy Scale: Good       Standing balance-Leahy Scale: Poor Standing balance comment: Fair static, and poor dynamic with need of RW during ambulation of dynamic standing ADLs                           ADL either performed or assessed with clinical judgement   ADL Overall ADL's : Needs assistance/impaired Eating/Feeding: Minimal assistance Eating/Feeding Details (indicate cue type and reason): Pt reports baseline difficulties with feeding due to tremor. Pt required all containers pre-opened for him and some hand over hand steadying to grip cup of coffee. Pt educated on tremor inhibition by wrist weights with or without weighted, enlarged utentils, vs gyroscopic utensils. Grooming: Minimal assistance;Sitting;Set up   Upper Body Bathing: Sitting;Supervision/ safety   Lower Body Bathing: Minimal assistance;Sitting/lateral leans;Sit to/from stand   Upper Body Dressing : Minimal assistance;Sitting   Lower Body Dressing: Minimal assistance;Sit to/from stand;Sitting/lateral leans   Toilet Transfer: Sports coach Details (  indicate cue type and reason): Pt with losses of balance while turning in bathroom with RW. Cues needed to stay within walker and for slow, steady steps while turning. Min As  needed for bathroom ombility as wel as descent and ascent to/from stanrd toilet with cues for use of grab bar. Toileting- Clothing Manipulation and Hygiene: Minimal assistance Toileting - Clothing Manipulation Details (indicate cue type and reason): Min As for clothing management.     Functional mobility during ADLs: Minimal assistance;Rolling walker;Cueing for safety       Vision   Vision Assessment?: No apparent visual deficits     Perception     Praxis      Pertinent Vitals/Pain Pain Assessment: No/denies pain     Hand Dominance Right   Extremity/Trunk Assessment Upper Extremity Assessment Upper Extremity Assessment: LUE deficits/detail RUE Deficits / Details: Constant tremor. AROM: WFL. MMT:3+/5 shoulder; 4-/5 elbow to wrist. RUE Coordination: decreased fine motor LUE Deficits / Details: Constant tremor. AROM: WFL. MMT:3+/5 shoulder; 4-/5 elbow to wrist. LUE Coordination: decreased fine motor   Lower Extremity Assessment Lower Extremity Assessment: Generalized weakness   Cervical / Trunk Assessment Cervical / Trunk Assessment: Normal   Communication Communication Communication: No difficulties   Cognition Arousal/Alertness: Awake/alert Behavior During Therapy: WFL for tasks assessed/performed Overall Cognitive Status: Within Functional Limits for tasks assessed                                     General Comments       Exercises     Shoulder Instructions      Home Living Family/patient expects to be discharged to:: Private residence Living Arrangements: Alone Available Help at Discharge: Available PRN/intermittently;Family Type of Home: House Home Access: Stairs to enter [employer] of Steps: 4 in front. Ramp in back Entrance Stairs-Rails: None Home Layout: One level     Bathroom Shower/Tub: Walk-in shower;Tub/shower unit (Pt usually uses the tub/shower because that is where to grab bar is.)   Bathroom Toilet: Handicapped  height     Home Equipment: Environmental consultant - 2 wheels;Walker - 4 wheels;Cane - single point;Shower seat;Grab bars - tub/shower;Adaptive equipment Adaptive Equipment: Reacher;Long-handled sponge Additional Comments: Son brings groceries or takes pt to the store, and can assist with heavy housework or other needs PRN. Son or brother in law provide transportation, although pt continues to drive but does not like it.      Prior Functioning/Environment Level of Independence: Independent with assistive device(s)        Comments: uses RW vs cane. At times, doesn't use anything ("depends on how I feel").  Pt endoreses 2 falls in past year with wounds to UEs but no broken bones.        OT Problem List: Decreased strength;Decreased coordination;Decreased activity tolerance;Impaired balance (sitting and/or standing)      OT Treatment/Interventions: Self-care/ADL training;Therapeutic exercise;Therapeutic activities;Energy conservation;DME and/or AE instruction;Patient/family education;Balance training    OT Goals(Current goals can be found in the care plan section) Acute Rehab OT Goals Patient Stated Goal: to get better and get home soon OT Goal Formulation: With patient Time For Goal Achievement: 09/08/20 Potential to Achieve Goals: Good ADL Goals Pt Will Perform Lower Body Dressing: with adaptive equipment;with modified independence;sit to/from stand Pt Will Transfer to Toilet: with modified independence;ambulating Pt Will Perform Toileting - Clothing Manipulation and hygiene: with modified independence;sit to/from stand;sitting/lateral leans Additional ADL Goal #1: Pt will engage in 8  min standing functional activities without loss of balance and with vitals stable, in order to demonstrate improved activity tolerance and balance needed to perform ADLs safely at home. Additional ADL Goal #2: Patient will tolerate BUE home exercise program 10-15 reps within pain-free ranges, in an unsupported seated  position, in order to improve upper body strength, endurance and core stability needed to complete home ADLs with SpO2 maintaining at or above 90%. Additional ADL Goal #3: Pt will identify at least 3 energy conservation techniques to employ at home in order to maximize function and prevent relapse and rehospitalization.  OT Frequency: Min 2X/week   Barriers to D/C: Decreased caregiver support  PRN support from son who workds days.       Co-evaluation              AM-PAC OT "6 Clicks" Daily Activity     Outcome Measure Help from another person eating meals?: A Little Help from another person taking care of personal grooming?: A Little Help from another person toileting, which includes using toliet, bedpan, or urinal?: A Little Help from another person bathing (including washing, rinsing, drying)?: A Little Help from another person to put on and taking off regular upper body clothing?: A Little Help from another person to put on and taking off regular lower body clothing?: A Little 6 Click Score: 18   End of Session Equipment Utilized During Treatment: Gait belt;Rolling walker Nurse Communication: Mobility status  Activity Tolerance: Patient tolerated treatment well Patient left: in chair;with call bell/phone within reach;with chair alarm set  OT Visit Diagnosis: Unsteadiness on feet (R26.81);History of falling (Z91.81);Muscle weakness (generalized) (M62.81);Repeated falls (R29.6);Feeding difficulties (R63.3)                Time: 2831-5176 OT Time Calculation (min): 33 min Charges:  OT General Charges $OT Visit: 1 Visit OT Evaluation $OT Eval Moderate Complexity: 1 Mod OT Treatments $Self Care/Home Management : 8-22 mins  Anderson Malta, OT Acute Rehab Services Office: [phone] 08/25/2020  Julien Girt 08/25/2020, 1:06 PM

## 2020-08-25 NOTE — Evaluation (Signed)
Physical Therapy Evaluation Patient Details Name: Paul Frazier. MRN: [id number] DOB: 1933/06/09 Today's Date: 08/25/2020   History of Present Illness  87 yo male admitted with N/V after falling at home. Dx: Covid. Hx of pacemaker, tremore, CABG, CAD  Clinical Impression  On eval, pt was Min guard assist for mobility. He walked ~60 feet around the room with use of a RW for safety/stability. He tolerated activity well.  O2: 94% on RA at rest; 90% on RA with activity. Dyspnea 2/4 with intermittent coughing.  Will plan to follow and progress activity as tolerated. Encouraged pt to use RW for ambulation safety.     Follow Up Recommendations Home health PT;Supervision - Intermittent    Equipment Recommendations  None recommended by PT    Recommendations for Other Services OT consult     Precautions / Restrictions Precautions Precautions: Fall Restrictions Weight Bearing Restrictions: No      Mobility  Bed Mobility Overal bed mobility: Modified Independent                  Transfers Overall transfer level: Needs assistance Equipment used: Rolling walker (2 wheeled) Transfers: Sit to/from Stand Sit to Stand: Min guard         General transfer comment: Min guard for safety. Cues for hand placement. Increased time.  Ambulation/Gait Ambulation/Gait assistance: Min guard Gait Distance (Feet): 60 Feet Assistive device: Rolling walker (2 wheeled) Gait Pattern/deviations: Step-through pattern;Decreased stride length     General Gait Details: Min guard for safety. Pt tolerated activity well. Dyspnea 2/4. O2 90% on RA.  Stairs            Wheelchair Mobility    Modified Rankin (Stroke Patients Only)       Balance Overall balance assessment: Needs assistance;History of Falls           Standing balance-Leahy Scale: Fair                               Pertinent Vitals/Pain Pain Assessment: No/denies pain    Home Living Family/patient  expects to be discharged to:: Private residence Living Arrangements: Alone Available Help at Discharge: Available PRN/intermittently;Family Type of Home: House Home Access: Stairs to enter Entrance Stairs-Rails: None Entrance Stairs-Number of Steps: 4 in front. Ramp in back Home Layout: One level Home Equipment: [place] - 2 wheels;Walker - 4 wheels;Cane - single point      Prior Function Level of Independence: Independent with assistive device(s)         Comments: uses RW vs cane. At times, doesn't use anything ("depends on how I feel")     Hand Dominance        Extremity/Trunk Assessment   Upper Extremity Assessment Upper Extremity Assessment: Defer to OT evaluation    Lower Extremity Assessment Lower Extremity Assessment: Generalized weakness    Cervical / Trunk Assessment Cervical / Trunk Assessment: Normal  Communication   Communication: No difficulties  Cognition Arousal/Alertness: Awake/alert Behavior During Therapy: WFL for tasks assessed/performed Overall Cognitive Status: Within Functional Limits for tasks assessed                                        General Comments      Exercises     Assessment/Plan    PT Assessment Patient needs continued PT services  PT Problem List Decreased  strength;Decreased mobility;Decreased activity tolerance;Decreased balance       PT Treatment Interventions DME instruction;Gait training;Therapeutic activities;Therapeutic exercise;Patient/family education;Balance training;Functional mobility training    PT Goals (Current goals can be found in the Care Plan section)  Acute Rehab PT Goals Patient Stated Goal: to get better and get home soon PT Goal Formulation: With patient Time For Goal Achievement: 09/08/20 Potential to Achieve Goals: Good    Frequency Min 3X/week   Barriers to discharge Decreased caregiver support      Co-evaluation               AM-PAC PT "6 Clicks" Mobility   Outcome Measure Help needed turning from your back to your side while in a flat bed without using bedrails?: None Help needed moving from lying on your back to sitting on the side of a flat bed without using bedrails?: None Help needed moving to and from a bed to a chair (including a wheelchair)?: A Little Help needed standing up from a chair using your arms (e.g., wheelchair or bedside chair)?: A Little Help needed to walk in hospital room?: A Little Help needed climbing 3-5 steps with a railing? : A Little 6 Click Score: 20    End of Session Equipment Utilized During Treatment: Gait belt Activity Tolerance: Patient tolerated treatment well Patient left: in chair;with call bell/phone within reach;with chair alarm set   PT Visit Diagnosis: Muscle weakness (generalized) (M62.81);Unsteadiness on feet (R26.81);History of falling (Z91.81)    Time: 1013-1040 PT Time Calculation (min) (ACUTE ONLY): 27 min   Charges:   PT Evaluation $PT Eval Moderate Complexity: 1 Mod PT Treatments $Gait Training: 8-22 mins          Doreatha Massed, PT Acute Rehabilitation  Office: [phone] Pager: [phone]

## 2020-08-25 NOTE — Progress Notes (Signed)
SATURATION QUALIFICATIONS: (This note is used to comply with regulatory documentation for home oxygen)  Patient Saturations on Room Air at Rest = 93-96%  Patient Saturations on Room Air while Ambulating = 90-91%  Patient Saturations on 2 Liters of oxygen while Ambulating = 93%

## 2020-08-26 LAB — FERRITIN: Ferritin: 159 ng/mL (ref 24–336)

## 2020-08-26 LAB — CBC WITH DIFFERENTIAL/PLATELET
Abs Immature Granulocytes: 0.07 10*3/uL (ref 0.00–0.07)
Basophils Absolute: 0 10*3/uL (ref 0.0–0.1)
Basophils Relative: 0 %
Eosinophils Absolute: 0 10*3/uL (ref 0.0–0.5)
Eosinophils Relative: 0 %
HCT: 44.1 % (ref 39.0–52.0)
Hemoglobin: 14.4 g/dL (ref 13.0–17.0)
Immature Granulocytes: 1 %
Lymphocytes Relative: 8 %
Lymphs Abs: 0.9 10*3/uL (ref 0.7–4.0)
MCH: 31.6 pg (ref 26.0–34.0)
MCHC: 32.7 g/dL (ref 30.0–36.0)
MCV: 96.9 fL (ref 80.0–100.0)
Monocytes Absolute: 0.9 10*3/uL (ref 0.1–1.0)
Monocytes Relative: 9 %
Neutro Abs: 8.3 10*3/uL — ABNORMAL HIGH (ref 1.7–7.7)
Neutrophils Relative %: 82 %
Platelets: 183 10*3/uL (ref 150–400)
RBC: 4.55 MIL/uL (ref 4.22–5.81)
RDW: 13.8 % (ref 11.5–15.5)
WBC: 10.1 10*3/uL (ref 4.0–10.5)
nRBC: 0 % (ref 0.0–0.2)

## 2020-08-26 LAB — COMPREHENSIVE METABOLIC PANEL
ALT: 36 U/L (ref 0–44)
AST: 27 U/L (ref 15–41)
Albumin: 3.1 g/dL — ABNORMAL LOW (ref 3.5–5.0)
Alkaline Phosphatase: 39 U/L (ref 38–126)
Anion gap: 8 (ref 5–15)
BUN: 22 mg/dL (ref 8–23)
CO2: 26 mmol/L (ref 22–32)
Calcium: 8.5 mg/dL — ABNORMAL LOW (ref 8.9–10.3)
Chloride: 99 mmol/L (ref 98–111)
Creatinine, Ser: 0.86 mg/dL (ref 0.61–1.24)
GFR, Estimated: >60 mL/min (ref >60)
Glucose, Bld: 200 mg/dL — ABNORMAL HIGH (ref 70–99)
Potassium: 4.4 mmol/L (ref 3.5–5.1)
Sodium: 133 mmol/L — ABNORMAL LOW (ref 135–145)
Total Bilirubin: 0.8 mg/dL (ref 0.3–1.2)
Total Protein: 5.7 g/dL — ABNORMAL LOW (ref 6.5–8.1)

## 2020-08-26 LAB — GLUCOSE, CAPILLARY
Glucose-Capillary: 174 mg/dL — ABNORMAL HIGH (ref 70–99)
Glucose-Capillary: 161 mg/dL — ABNORMAL HIGH (ref 70–99)
Glucose-Capillary: 181 mg/dL — ABNORMAL HIGH (ref 70–99)
Glucose-Capillary: 160 mg/dL — ABNORMAL HIGH (ref 70–99)

## 2020-08-26 LAB — C-REACTIVE PROTEIN: CRP: 4.9 mg/dL — ABNORMAL HIGH (ref <1.0)

## 2020-08-26 LAB — MAGNESIUM: Magnesium: 2.1 mg/dL (ref 1.7–2.4)

## 2020-08-26 LAB — D-DIMER, QUANTITATIVE: D-Dimer, Quant: 0.43 ug/mL-FEU (ref 0.00–0.50)

## 2020-08-26 LAB — HEMOGLOBIN A1C
Hgb A1c MFr Bld: 6.4 % — ABNORMAL HIGH (ref 4.8–5.6)
Mean Plasma Glucose: 136.98 mg/dL

## 2020-08-26 LAB — PHOSPHORUS: Phosphorus: 2.8 mg/dL (ref 2.5–4.6)

## 2020-08-26 MED ORDER — INSULIN ASPART 100 UNIT/ML ~~LOC~~ SOLN
0.0000 [IU] | Freq: Every day | SUBCUTANEOUS | Status: DC
  Administered 2020-08-27 – 2020-08-28 (×2): 2 [IU] via SUBCUTANEOUS

## 2020-08-26 MED ORDER — INSULIN ASPART 100 UNIT/ML ~~LOC~~ SOLN
0.0000 [IU] | Freq: Three times a day (TID) | SUBCUTANEOUS | Status: DC
  Administered 2020-08-26 – 2020-08-27 (×2): 2 [IU] via SUBCUTANEOUS
  Administered 2020-08-28: 1 [IU] via SUBCUTANEOUS
  Administered 2020-08-28: 3 [IU] via SUBCUTANEOUS

## 2020-08-26 NOTE — Progress Notes (Signed)
PROGRESS NOTE  Paul Frazier.  DOB: 01/30/1933  PCP: Unk Pinto, MD PXT:[id number]  DOA: 08/23/2020  LOS: 2 days   Chief Complaint  Patient presents with  . Nausea  . Weakness   Brief narrative: Paul Frazier. is a 87 y.o. male with PMH significant for HTN, HLD, CAD-MI-s/p CABG, osteoarthritis, essential tremor.  Patient was brought to the ED on 08/23/2020 with complaints of diarrhea, vomiting, worsening generalized weakness for 2 to 3 days as well as sore throat and productive cough.   In the ED, patient had a temperature of 100.4, breathing on room air. Covid PCR positive. CT abdomen/pelvis with contrast showed bilateral predominantly peripheral and subpleural areas of groundglass and patchy consolidation consistent with COVID pneumonia. Patient was admitted for COVID-19 pneumonia.  Subjective: Patient was seen and examined this morning. Lying on bed.  Not in distress.  Not on supplemental oxygen.  Feels better.  CRP level is improving.  Assessment/Plan: COVID pneumonia -Presented with predominantly GI symptoms: Nausea, vomiting, diarrhea -COVID test: PCR positive on admission -Chest imaging: Predominantly peripheral and subpleural areas of groundglass and patchy consolidation  -Treatment: Patient is currently on 5-day course of IV remdesivir till 2/9.  On IV Solu-Medrol 40 mg twice daily. -Progression: Patient states he feels better.  Not requiring supplemental oxygen.  CRP level is trending down.   -Oxygen - SpO2: 96 % -Supportive care: Vitamin C, Zinc, PRN inhalers, Tylenol, Antitussives (benzonatate/ Mucinex/Tussionex).   -Encouraged incentive spirometry, prone position, out of bed and early mobilization as much as possible -Continue airborne/contact isolation precautions for duration of 3 weeks from the day of diagnosis. -WBC and inflammatory markers trend as below. Recent Labs  Lab 08/23/20 1511 08/23/20 1528 08/23/20 1805 08/24/20 0350 08/25/20 0258  08/26/20 0343  SARSCOV2NAA  --  POSITIVE*  --   --   --   --   WBC 9.7  --   --  9.0 10.3 10.1  LATICACIDVEN 1.5  --  1.7  --   --   --   PROCALCITON <0.10  --   --   --   --   --   DDIMER 0.55*  --   --  0.59* 0.74* 0.43  FERRITIN 99  --   --  114 151 159  LDH 216*  --   --   --   --   --   CRP 1.2*  --   --  7.7* 7.3* 4.9*  ALT 29  --   --  28 28 36  The treatment plan and use of medications and known side effects were discussed with patient/family. Some of the medications used are based on case reports/anecdotal data.  All other medications being used in the management of COVID-19 based on limited study data.  Complete risks and long-term side effects are unknown, however in the best clinical judgment they seem to be of some benefit.  Patient wanted to proceed with treatment options provided.  Elevated CK -Likely because of dehydration. -CK level is only mildly elevated.  He has been receiving IV fluid.  I would avoid further hydration because it can worsen respiratory status in the setting of Covid pneumonia. -Repeat CK level tomorrow. Recent Labs  Lab 08/23/20 1511 08/24/20 0350  CKTOTAL 588* 762*   CAD -Continue apixaban 5 mg twice daily, Lipitor 40 mg daily and Toprol 25 mg daily  Paroxysmal atrial flutter -Continue metoprolol and Eliquis. -Continue to monitor in telemetry  Chronic combined systolic and diastolic  heart failure Essential hypertension -Currently stable blood pressure and compensated CHF on metoprolol, losartan.  Lasix on hold. -Continue to monitor blood pressure.  Hyperlipidemia  Atherosclerosis of aorta -Resume Lipitor once CK level improved.  Mild protein malnutrition  -Protein supplementation. -Nutrition consulted. Depression -Continue bupropion 300 mg p.o. daily. -Continue low-dose diazepam as needed for anxiety. -Remained stable  Mobility: Encourage ambulation Code Status:   Code Status: Full Code  Nutritional status: Body mass index  is 27.93 kg/m.     Diet Order            Diet Heart Room service appropriate? Yes; Fluid consistency: Thin  Diet effective now                 DVT prophylaxis:  apixaban (ELIQUIS) tablet 5 mg   Antimicrobials:  None Fluid: None Consultants: None Family Communication:  None at bedside  Status is: Inpatient  Remains inpatient appropriate because: Getting IV treatment for Covid pneumonia  Dispo: The patient is from: Home              Anticipated d/c is to: Home with home health PT              Anticipated d/c date is: Hopefully home tomorrow              Patient currently is not medically stable to d/c.   Difficult to place patient No       Infusions:  . remdesivir 100 mg in NS 100 mL 100 mg (08/25/20 1020)    Scheduled Meds: . apixaban  5 mg Oral BID  . vitamin C  500 mg Oral Daily  . buPROPion  300 mg Oral Daily  . cholecalciferol  5,000 Units Oral Daily  . insulin aspart  0-5 Units Subcutaneous QHS  . insulin aspart  0-9 Units Subcutaneous TID WC  . losartan  12.5 mg Oral Daily  . methylPREDNISolone (SOLU-MEDROL) injection  40 mg Intravenous Q12H   Followed by  . [START ON 08/27/2020] predniSONE  50 mg Oral Daily  . metoprolol succinate  25 mg Oral Daily  . potassium chloride  10 mEq Oral BID  . zinc sulfate  220 mg Oral Daily    Antimicrobials: Anti-infectives (From admission, onward)   Start     Dose/Rate Route Frequency Ordered Stop   08/24/20 1000  remdesivir 100 mg in sodium chloride 0.9 % 100 mL IVPB       "Followed by" Linked Group Details   100 mg 200 mL/hr over 30 Minutes Intravenous Daily 08/23/20 1654 08/28/20 0959   08/24/20 1000  azithromycin (ZITHROMAX) tablet 250 mg  Status:  Discontinued       Note to Pharmacy: OP QQV:ZDGL 2 tablets with Food on  Day 1, then 1 tablet Daily with Food for Sinusitis / Bronchitis Patient taking differently: Take 2 tablets (500 mg) by mouth on day 1 with food, take 1 tablet (250 mg) on days 2-5 -  for Sinusitis  / Bronchitis     250 mg Oral See admin instructions 08/23/20 2327 08/24/20 0008   08/23/20 1700  remdesivir 200 mg in sodium chloride 0.9% 250 mL IVPB       "Followed by" Linked Group Details   200 mg 580 mL/hr over 30 Minutes Intravenous Once 08/23/20 1654 08/23/20 1844      PRN meds: acetaminophen **OR** acetaminophen, albuterol, chlorpheniramine-HYDROcodone, diazepam, gabapentin, guaiFENesin-dextromethorphan, nitroGLYCERIN, prochlorperazine   Objective: Vitals:   08/25/20 2133 08/26/20 0414  BP: 134/70 137/82  Pulse: 68 69  Resp: 18 18  Temp: 98 F (36.7 C) 98.3 F (36.8 C)  SpO2: 97% 96%    Intake/Output Summary (Last 24 hours) at 08/26/2020 1043 Last data filed at 08/26/2020 1009 Gross per 24 hour  Intake 1709.24 ml  Output 1100 ml  Net 609.24 ml   Filed Weights   08/24/20 1347  Weight: 78.5 kg   Weight change:  Body mass index is 27.93 kg/m.   Physical Exam: General exam: Pleasant elderly Caucasian male.  Not in distress Skin: No rashes, lesions or ulcers. HEENT: Atraumatic, normocephalic, no obvious bleeding Lungs: Clear to auscultation bilaterally CVS: Rate controlled, no murmur GI/Abd soft, nontender, nondistended, bowel sound present CNS: Alert, awake, oriented to place and person Psychiatry: Mood appropriate Extremities: No pedal edema, no calf tenderness  Data Review: I have personally reviewed the laboratory data and studies available.  Recent Labs  Lab 08/23/20 1511 08/24/20 0350 08/25/20 0258 08/26/20 0343  WBC 9.7 9.0 10.3 10.1  NEUTROABS 7.8* 7.6 8.6* 8.3*  HGB 14.3 13.8 13.8 14.4  HCT 42.4 41.4 41.8 44.1  MCV 95.3 96.5 96.8 96.9  PLT 142* 142* 160 183   Recent Labs  Lab 08/23/20 1511 08/24/20 0350 08/25/20 0258 08/26/20 0343  NA 138 136 133* 133*  K 3.7 3.9 4.1 4.4  CL 103 101 99 99  CO2 24 26 25 26   GLUCOSE 98 95 172* 200*  BUN 26* 24* 24* 22  CREATININE 1.03 1.00 0.92 0.86  CALCIUM 8.0* 7.8* 8.0* 8.5*  MG  --  1.9 1.9 2.1   PHOS  --  1.5* 2.4* 2.8    F/u labs ordered.  Signed, Terrilee Croak, MD [facility] 08/26/2020

## 2020-08-27 LAB — CBC WITH DIFFERENTIAL/PLATELET
Abs Immature Granulocytes: 0.12 10*3/uL — ABNORMAL HIGH (ref 0.00–0.07)
Basophils Absolute: 0 10*3/uL (ref 0.0–0.1)
Basophils Relative: 0 %
Eosinophils Absolute: 0 10*3/uL (ref 0.0–0.5)
Eosinophils Relative: 0 %
HCT: 44.4 % (ref 39.0–52.0)
Hemoglobin: 14.7 g/dL (ref 13.0–17.0)
Immature Granulocytes: 1 %
Lymphocytes Relative: 13 %
Lymphs Abs: 1.3 10*3/uL (ref 0.7–4.0)
MCH: 32.5 pg (ref 26.0–34.0)
MCHC: 33.1 g/dL (ref 30.0–36.0)
MCV: 98.2 fL (ref 80.0–100.0)
Monocytes Absolute: 1.4 10*3/uL — ABNORMAL HIGH (ref 0.1–1.0)
Monocytes Relative: 14 %
Neutro Abs: 7.6 10*3/uL (ref 1.7–7.7)
Neutrophils Relative %: 72 %
Platelets: 222 10*3/uL (ref 150–400)
RBC: 4.52 MIL/uL (ref 4.22–5.81)
RDW: 13.8 % (ref 11.5–15.5)
WBC: 10.5 10*3/uL (ref 4.0–10.5)
nRBC: 0 % (ref 0.0–0.2)

## 2020-08-27 LAB — PHOSPHORUS: Phosphorus: 2.2 mg/dL — ABNORMAL LOW (ref 2.5–4.6)

## 2020-08-27 LAB — COMPREHENSIVE METABOLIC PANEL
ALT: 38 U/L (ref 0–44)
AST: 25 U/L (ref 15–41)
Albumin: 2.9 g/dL — ABNORMAL LOW (ref 3.5–5.0)
Alkaline Phosphatase: 39 U/L (ref 38–126)
Anion gap: 10 (ref 5–15)
BUN: 23 mg/dL (ref 8–23)
CO2: 23 mmol/L (ref 22–32)
Calcium: 8.4 mg/dL — ABNORMAL LOW (ref 8.9–10.3)
Chloride: 102 mmol/L (ref 98–111)
Creatinine, Ser: 0.97 mg/dL (ref 0.61–1.24)
GFR, Estimated: >60 mL/min (ref >60)
Glucose, Bld: 139 mg/dL — ABNORMAL HIGH (ref 70–99)
Potassium: 4.4 mmol/L (ref 3.5–5.1)
Sodium: 135 mmol/L (ref 135–145)
Total Bilirubin: 0.7 mg/dL (ref 0.3–1.2)
Total Protein: 5.4 g/dL — ABNORMAL LOW (ref 6.5–8.1)

## 2020-08-27 LAB — GLUCOSE, CAPILLARY
Glucose-Capillary: 117 mg/dL — ABNORMAL HIGH (ref 70–99)
Glucose-Capillary: 103 mg/dL — ABNORMAL HIGH (ref 70–99)
Glucose-Capillary: 187 mg/dL — ABNORMAL HIGH (ref 70–99)
Glucose-Capillary: 168 mg/dL — ABNORMAL HIGH (ref 70–99)
Glucose-Capillary: 208 mg/dL — ABNORMAL HIGH (ref 70–99)

## 2020-08-27 LAB — C-REACTIVE PROTEIN: CRP: 3.2 mg/dL — ABNORMAL HIGH (ref <1.0)

## 2020-08-27 LAB — MAGNESIUM: Magnesium: 2 mg/dL (ref 1.7–2.4)

## 2020-08-27 LAB — D-DIMER, QUANTITATIVE: D-Dimer, Quant: 0.55 ug/mL-FEU — ABNORMAL HIGH (ref 0.00–0.50)

## 2020-08-27 LAB — CK: Total CK: 74 U/L (ref 49–397)

## 2020-08-27 LAB — FERRITIN: Ferritin: 140 ng/mL (ref 24–336)

## 2020-08-27 MED ORDER — PREDNISONE 10 MG PO TABS: ORAL_TABLET | ORAL | 0 refills | Status: DC

## 2020-08-27 MED ORDER — GUAIFENESIN-DM 100-10 MG/5ML PO SYRP: 10.0000 mL | ORAL_SOLUTION | ORAL | 0 refills | Status: DC | PRN

## 2020-08-27 NOTE — TOC Progression Note (Signed)
Transition of Care (TOC) - Progression Note    Patient Details  Name: Paul Frazier. MRN: [id number] Date of Birth: [birth year]  Transition of Care [facility]) CM/SW [place], [place] Phone Number: 08/27/2020, 2:51 PM  Clinical Narrative:   Patient with discharge order is now saying that he is rethinking his plan.  Due to his level of weakness and the fact that he lives alone, he believes he would be safer going to short term rehab.  I consulted with MD who supports the process of looking for a bed.  Explained to patient limited options.  [facility] [place] is not an option because he is not vaccinated, which was a disappointment to him as his wife is apparently a LTC resident there.  Called son to update him on plan.  Bed search initiated. TOC will continue to follow during the course of hospitalization.     Expected Discharge Plan: Skilled Nursing Facility Barriers to Discharge: SNF Covid  Expected Discharge Plan and Services Expected Discharge Plan: [place]   Discharge Planning Services: CM Consult Post Acute Care Choice: [place] arrangements for the past 2 months: Single Family Home Expected Discharge Date: 08/27/20                                     Social Determinants of Health (SDOH) Interventions    Readmission Risk Interventions Readmission Risk Prevention Plan 03/10/2018  Post Dischage Appt Complete  Medication Screening Complete  Transportation Screening Complete  PCP follow-up Complete  Some recent data might be hidden

## 2020-08-27 NOTE — NC FL2 (Signed)
[place] LEVEL OF CARE SCREENING TOOL     IDENTIFICATION  Patient Name: Paul Frazier. Birthdate: 23-Jun-1933 Sex: male Admission Date (Current Location): 08/23/2020  [facility] and [place] Number:  Herbalist and Address:  [facility],  [place] [address], [place]      Provider Number: [id number]  Attending Physician Name and Address:  Terrilee Croak, MD  Relative Name and Phone Number:  Kemoni, Ortega (Son)   [phone]    Current Level of Care: Hospital Recommended Level of Care: [place] Prior Approval Number:    Date Approved/Denied:   PASRR Number:   [id number]  Discharge Plan: SNF    Current Diagnoses: Patient Active Problem List   Diagnosis Date Noted  . COVID-19 virus infection 08/24/2020  . Pneumonia due to COVID-19 virus 08/23/2020  . Mild protein malnutrition ([place]) 08/23/2020  . Elevated CK 08/23/2020  . Thrombocytosis 08/23/2020  . At risk for falls 12/04/2019  . Unsteady gait 12/04/2019  . Acquired thrombophilia ([place]) 07/23/2019  . Chronic combined systolic and diastolic heart failure ([place]) 08/29/2018  . Pacemaker 08/29/2018  . History of transient ischemic attack (TIA) 08/29/2018  . Lumbar pain 06/09/2018  . Senile purpura ([place]) 05/11/2018  . Complete heart block ([place])   . Paroxysmal atrial flutter ([place]) 03/06/2018  . Atherosclerosis of aorta ([place]) 08/23/2016  . History of basal cell cancer 08/01/2015  . Major depression in full remission ([place]) 09/11/2014  . Essential hypertension 01/01/2014  . Vitamin D deficiency 01/01/2014  . Medication management 01/01/2014  . Other abnormal glucose 01/01/2014  . Nonsustained ventricular tachycardia ([place]) 05/23/2013  . CAD (coronary artery disease) 01/08/2013  . Cardiomyopathy, ischemic 01/08/2013  . Hyperlipidemia 01/08/2013  . LBBB (left bundle branch block) 01/08/2013    Orientation RESPIRATION BLADDER Height & Weight      Self,Situation,Place  Normal External catheter Weight: 78.5 kg Height:  5\' 6"  (167.6 cm)  BEHAVIORAL SYMPTOMS/MOOD NEUROLOGICAL BOWEL NUTRITION STATUS   (none)  (none) Continent Diet (see d/c summary)  AMBULATORY STATUS COMMUNICATION OF NEEDS Skin   Extensive Assist Verbally Normal                       Personal Care Assistance Level of Assistance  Bathing,Feeding,Dressing Bathing Assistance: Maximum assistance Feeding assistance: Independent Dressing Assistance: Limited assistance     Functional Limitations Info  Sight,Hearing,Speech Sight Info: Adequate Hearing Info: Adequate Speech Info: Adequate    SPECIAL CARE FACTORS FREQUENCY  PT (By licensed PT),OT (By licensed OT)     PT Frequency: 5X/W OT Frequency: 5X/W            Contractures Contractures Info: Not present    Additional Factors Info  Code Status,Allergies Code Status Info: full Allergies Info: Erythromycin, Fluoruoracil           Current Medications (08/27/2020):  This is the current hospital active medication list Current Facility-Administered Medications  Medication Dose Route Frequency Provider Last Rate Last Admin  . acetaminophen (TYLENOL) tablet 650 mg  650 mg Oral Q6H PRN Reubin Milan, MD       Or  . acetaminophen (TYLENOL) suppository 650 mg  650 mg Rectal Q6H PRN Reubin Milan, MD      . albuterol (VENTOLIN HFA) 108 (90 Base) MCG/ACT inhaler 2 puff  2 puff Inhalation Q6H PRN Reubin Milan, MD      . apixaban [facility]) tablet 5 mg  5 mg Oral BID  Reubin Milan, MD   5 mg at 08/27/20 1004  . ascorbic acid (VITAMIN C) tablet 500 mg  500 mg Oral Daily Reubin Milan, MD   500 mg at 08/27/20 1004  . buPROPion (WELLBUTRIN XL) 24 hr tablet 300 mg  300 mg Oral Daily Reubin Milan, MD   300 mg at 08/27/20 1004  . chlorpheniramine-HYDROcodone (TUSSIONEX) 10-8 MG/5ML suspension 2.5 mL  2.5 mL Oral Q12H PRN Reubin Milan, MD      . cholecalciferol (VITAMIN  D3) tablet 5,000 Units  5,000 Units Oral Daily Reubin Milan, MD   5,000 Units at 08/26/20 1044  . diazepam (VALIUM) tablet 2.5 mg  2.5 mg Oral TID PRN Reubin Milan, MD   2.5 mg at 08/25/20 0006  . gabapentin (NEURONTIN) capsule 300 mg  300 mg Oral TID PRN Reubin Milan, MD      . guaiFENesin-dextromethorphan [facility] DM) 100-10 MG/5ML syrup 10 mL  10 mL Oral Q4H PRN Reubin Milan, MD   10 mL at 08/27/20 1223  . insulin aspart (novoLOG) injection 0-5 Units  0-5 Units Subcutaneous QHS Dahal, Binaya, MD      . insulin aspart (novoLOG) injection 0-9 Units  0-9 Units Subcutaneous TID WC Terrilee Croak, MD   2 Units at 08/26/20 1820  . losartan (COZAAR) tablet 12.5 mg  12.5 mg Oral Daily Reubin Milan, MD   12.5 mg at 08/27/20 1003  . metoprolol succinate (TOPROL-XL) 24 hr tablet 25 mg  25 mg Oral Daily Reubin Milan, MD   25 mg at 08/27/20 1004  . nitroGLYCERIN (NITROSTAT) SL tablet 0.4 mg  0.4 mg Sublingual Q5 min PRN Reubin Milan, MD      . potassium chloride (KLOR-CON) CR tablet 10 mEq  10 mEq Oral BID Reubin Milan, MD   10 mEq at 08/27/20 1004  . predniSONE (DELTASONE) tablet 50 mg  50 mg Oral Daily Reubin Milan, MD   50 mg at 08/27/20 1003  . prochlorperazine (COMPAZINE) injection 5 mg  5 mg Intravenous Q6H PRN Reubin Milan, MD      . zinc sulfate capsule 220 mg  220 mg Oral Daily Reubin Milan, MD   220 mg at 08/27/20 1004     Discharge Medications: Please see discharge summary for a list of discharge medications.  Relevant Imaging Results:  Relevant Lab Results:   Additional Information [place], [place]

## 2020-08-27 NOTE — Discharge Summary (Addendum)
Physician Discharge Summary  Paul Nap. ZJQ:[id number] DOB: 04/01/1933 DOA: 08/23/2020  PCP: Unk Pinto, MD  Admit date: 08/23/2020 Discharge date: 08/28/2020  Admitted From: Home Discharge disposition: [facility]   Code Status: Full Code  Diet Recommendation: Cardiac diet  Discharge Diagnosis:   Principal Problem:   Pneumonia due to COVID-19 virus Active Problems:   CAD (coronary artery disease)   Hyperlipidemia   Essential hypertension   Major depression in full remission ([place])   Atherosclerosis of aorta (HCC)   Paroxysmal atrial flutter (HCC)   Chronic combined systolic and diastolic heart failure ([place])   Mild protein malnutrition ([place])   Elevated CK   Thrombocytosis   COVID-19 virus infection   Chief Complaint  Patient presents with  . Nausea  . Weakness   Brief narrative: Ogle Hoeffner. is a 87 y.o. male with PMH significant for HTN, HLD, CAD-MI-s/p CABG, osteoarthritis, essential tremor.  Patient was brought to the ED on 08/23/2020 with complaints of diarrhea, vomiting, worsening generalized weakness for 2 to 3 days as well as sore throat and productive cough.   In the ED, patient had a temperature of 100.4, breathing on room air. Covid PCR positive. CT abdomen/pelvis with contrast showed bilateral predominantly peripheral and subpleural areas of groundglass and patchy consolidation consistent with COVID pneumonia. Patient was admitted for COVID-19 pneumonia. See details below  Subjective: Patient was seen and examined this morning. Lying on bed.  Not in distress.  Not on supplemental oxygen.  Feels better and ready to go home.  Hospital course: COVID pneumonia -Presented with predominantly GI symptoms: Nausea, vomiting, diarrhea -COVID test: PCR positive on admission -Chest imaging: Predominantly peripheral and subpleural areas of groundglass and patchy consolidation  -Treatment: Patient completed 5-day course of IV remdesivir today. He  is on prednisone 50 mg daily. We will switch him to a tapering course of oral prednisone.   -He is currently not requiring supplemental oxygen at rest or on ambulation. -Continue cough medicine as needed.  -Continue airborne/contact isolation precautions for duration of 3 weeks from the day of diagnosis. -WBC and inflammatory markers trend as below. Recent Labs  Lab 08/23/20 1511 08/23/20 1528 08/23/20 1805 08/24/20 0350 08/25/20 0258 08/26/20 0343 08/27/20 0353 08/28/20 0350  SARSCOV2NAA  --  POSITIVE*  --   --   --   --   --   --   WBC 9.7  --   --  9.0 10.3 10.1 10.5 11.0*  LATICACIDVEN 1.5  --  1.7  --   --   --   --   --   PROCALCITON <0.10  --   --   --   --   --   --   --   DDIMER 0.55*  --   --  0.59* 0.74* 0.43 0.55* 0.53*  FERRITIN 99  --   --  114 151 159 140 117  LDH 216*  --   --   --   --   --   --   --   CRP 1.2*  --   --  7.7* 7.3* 4.9* 3.2* 2.3*  ALT 29  --   --  28 28 36 38 44  The treatment plan and use of medications and known side effects were discussed with patient/family. Some of the medications used are based on case reports/anecdotal data.  All other medications being used in the management of COVID-19 based on limited study data.  Complete risks and long-term side  effects are unknown, however in the best clinical judgment they seem to be of some benefit.  Patient wanted to proceed with treatment options provided.  Elevated CK -Likely because of dehydration.  Level has improved over time with hydration Recent Labs  Lab 08/23/20 1511 08/24/20 0350 08/27/20 0353 08/28/20 0350  CKTOTAL 588* 762* 74 69   CAD -Continue apixaban 5 mg twice daily, Lipitor 40 mg daily and Toprol 25 mg daily  Paroxysmal atrial flutter -Continue metoprolol and Eliquis. -Continue to monitor in telemetry  Chronic combined systolic and diastolic heart failure Essential hypertension -Currently stable blood pressure and compensated CHF on metoprolol, losartan.  Lasix on  hold.  Okay to resume PRN for pedal edema post discharge. -Continue to monitor blood pressure.  Hyperlipidemia  Atherosclerosis of aorta -Resume Lipitor once CK level improved.  Mild protein malnutrition  -Protein supplementation. -Nutrition consulted. Depression -Continue bupropion 300 mg p.o. daily. -Continue low-dose diazepam as needed for anxiety. -Remained stable  Stable for discharge to [facility].  Wound care: Incision (Closed) 03/09/18 Chest Left (Active)  Date First Assessed/Time First Assessed: 03/09/18 1519   Location: Chest  Location Orientation: Left    Assessments 03/09/2018  3:19 PM 03/10/2018  8:00 AM  Dressing Type Gauze (Comment) Adhesive strips  Dressing Clean;Dry;Intact Clean;Dry;Intact  Site / Wound Assessment Dressing in place / Unable to assess --  Drainage Amount None None  Treatment -- Off loading;Other (Comment)     No Linked orders to display    Discharge Exam:   Vitals:   08/27/20 1549 08/27/20 2013 08/28/20 0430 08/28/20 0442  BP: (!) 143/85 120/68 113/79 113/79  Pulse: 79 61 70 70  Resp: (!) 21 18 18 18   Temp: 98.2 F (36.8 C) 97.8 F (36.6 C) 97.7 F (36.5 C) 97.7 F (36.5 C)  TempSrc: Oral Oral Oral   SpO2: 97% 92% 93% 93%  Weight:      Height:        Body mass index is 27.93 kg/m.  General exam: Pleasant, elderly Caucasian male.  Not in distress Skin: No rashes, lesions or ulcers. HEENT: Atraumatic, normocephalic, no obvious bleeding Lungs: Clear to auscultation bilaterally CVS: Regular rate and rhythm, no murmur GI/Abd soft, nontender, nondistended, bowel sound present CNS: Alert, awake, oriented x3 Psychiatry: Mood appropriate. Extremities: No pedal edema, no tenderness  Follow ups:   Discharge Instructions    Diet - low sodium heart healthy   Complete by: As directed    Increase activity slowly   Complete by: As directed       Contact information for follow-up providers    [facility] Follow up.    Specialty: [place] Why: This is the home health agency that will be providing in-home physical therapy Contact information: [address] [place] [address] [phone]            Contact information for after-discharge care    Destination    [facility] .   Service: Skilled Nursing Contact information: [place] [phone]                  Recommendations for Outpatient Follow-Up:   1. Follow-up with PCP as an outpatient  Discharge Instructions:  Follow with Primary MD Unk Pinto, MD in 7 days   Get CBC/BMP checked in next visit within 1 week by PCP or SNF MD ( we routinely change or add medications that can affect your baseline  labs and fluid status, therefore we recommend that you get the mentioned basic workup next visit with your PCP, your PCP may decide not to get them or add new tests based on their clinical decision)  On your next visit with your PCP, please Get Medicines reviewed and adjusted.  Please request your PCP  to go over all Hospital Tests and Procedure/Radiological results at the follow up, please get all Hospital records sent to your Prim MD by signing hospital release before you go home.  Activity: As tolerated with Full fall precautions use walker/cane & assistance as needed  For Heart failure patients - Check your Weight same time everyday, if you gain over 2 pounds, or you develop in leg swelling, experience more shortness of breath or chest pain, call your Primary MD immediately. Follow Cardiac Low Salt Diet and 1.5 lit/day fluid restriction.  If you have smoked or chewed Tobacco in the last 2 yrs please stop smoking, stop any regular Alcohol  and or any Recreational drug use.  If you experience worsening of your admission symptoms, develop shortness of breath, life threatening emergency, suicidal or homicidal thoughts you must seek medical attention  immediately by calling 911 or calling your MD immediately  if symptoms less severe.  You Must read complete instructions/literature along with all the possible adverse reactions/side effects for all the Medicines you take and that have been prescribed to you. Take any new Medicines after you have completely understood and accpet all the possible adverse reactions/side effects.   Do not drive, operate heavy machinery, perform activities at heights, swimming or participation in water activities or provide baby sitting services if your were admitted for syncope or siezures until you have seen by Primary MD or a Neurologist and advised to do so again.  Do not drive when taking Pain medications.  Do not take more than prescribed Pain, Sleep and Anxiety Medications  Wear Seat belts while driving.   Please note You were cared for by a hospitalist during your hospital stay. If you have any questions about your discharge medications or the care you received while you were in the hospital after you are discharged, you can call the unit and asked to speak with the hospitalist on call if the hospitalist that took care of you is not available. Once you are discharged, your primary care physician will handle any further medical issues. Please note that NO REFILLS for any discharge medications will be authorized once you are discharged, as it is imperative that you return to your primary care physician (or establish a relationship with a primary care physician if you do not have one) for your aftercare needs so that they can reassess your need for medications and monitor your lab values.    Allergies as of 08/28/2020      Reactions   Erythromycin Nausea Only   Fluorouracil Rash, Other (See Comments)   Mouth ulcers      Medication List    STOP taking these medications   azithromycin 250 MG tablet Commonly known as: Zithromax   dexamethasone 4 MG tablet Commonly known as: DECADRON   diazepam 5 MG  tablet Commonly known as: VALIUM     TAKE these medications   atorvastatin 40 MG tablet Commonly known as: LIPITOR Take 1 tablet daily for Cholesterol What changed:   how much to take  how to take this  when to take this  additional instructions   buPROPion 300 MG 24 hr tablet Commonly known as:  WELLBUTRIN XL Take 1 tablet daily for Mood What changed:   how much to take  how to take this  when to take this  additional instructions   citalopram 40 MG tablet Commonly known as: CELEXA Take 1 tablet (40 mg total) by mouth daily.   Eliquis 5 MG Tabs tablet Generic drug: apixaban TAKE 1 TABLET BY MOUTH  TWICE DAILY What changed: how much to take   furosemide 40 MG tablet Commonly known as: LASIX Take 1 tablet (40 mg total) by mouth daily as needed for fluid or edema. What changed:   when to take this  reasons to take this   gabapentin 300 MG capsule Commonly known as: NEURONTIN TAKE 1 CAPSULE BY MOUTH 3  TIMES DAILY AS NEEDED FOR  CHRONIC PAIN What changed:   how much to take  how to take this  when to take this  reasons to take this  additional instructions   guaiFENesin-dextromethorphan 100-10 MG/5ML syrup Commonly known as: ROBITUSSIN DM Take 10 mLs by mouth every 4 (four) hours as needed for cough.   losartan 25 MG tablet Commonly known as: COZAAR Take 0.5 tablets (12.5 mg total) by mouth daily.   metoprolol succinate 25 MG 24 hr tablet Commonly known as: TOPROL-XL Take 1 tablet Daily for BP & Heart What changed:   how much to take  how to take this  when to take this  additional instructions   nitroGLYCERIN 0.4 MG SL tablet Commonly known as: NITROSTAT Place 1 tablet (0.4 mg total) under the tongue every 5 (five) minutes as needed for chest pain.   omeprazole 20 MG tablet Commonly known as: PRILOSEC OTC Take 20 mg by mouth daily as needed (acid reflux).   OVER THE COUNTER MEDICATION Take 2 tablets by mouth daily. Magnesium  Broad Spectrum Complex for muscle, nerve, bone health   OVER THE COUNTER MEDICATION Take 2 capsules by mouth daily. Omega Q with resveratrol and turmeric   potassium chloride SA 20 MEQ tablet Commonly known as: KLOR-CON Take 40 mEq by mouth daily. What changed: Another medication with the same name was removed. Continue taking this medication, and follow the directions you see here.   predniSONE 10 MG tablet Commonly known as: DELTASONE Take 4 tablets daily X 2 days, then, Take 3 tablets daily X 2 days, then, Take 2 tablets daily X 2 days, then, Take 1 tablets daily X 1 day.   Vitamin D 125 MCG (5000 UT) Caps Take 5,000 Units by mouth daily.       Time coordinating discharge: 35 minutes  The results of significant diagnostics from this hospitalization (including imaging, microbiology, ancillary and laboratory) are listed below for reference.    Procedures and Diagnostic Studies:   CT Head Wo Contrast  Result Date: 08/23/2020 CLINICAL DATA:  Fall home today.  COVID positive EXAM: CT HEAD WITHOUT CONTRAST CT CERVICAL SPINE WITHOUT CONTRAST TECHNIQUE: Multidetector CT imaging of the head and cervical spine was performed following the standard protocol without intravenous contrast. Multiplanar CT image reconstructions of the cervical spine were also generated. COMPARISON:  CT January 22, 2019. FINDINGS: CT HEAD FINDINGS Brain: Unchanged mild age related atrophy with ex vacuo dilatation of the ventricular system. Similar microvascular ischemic change. No acute intracranial hemorrhage. No mass effect or midline shift. Vascular: No hyperdense vessel or unexpected calcification. Atherosclerotic calcifications of the bilateral carotid siphon. Skull: Normal. Negative for fracture or focal lesion. Sinuses/Orbits: Mild mucosal thickening in the ethmoid sinuses. Other: None. CT CERVICAL  SPINE FINDINGS Alignment: Straightening normal cervical lordosis, likely positional. Skull base and vertebrae: No acute  fracture. No primary bone lesion or focal pathologic process. Mineralized pannus posterior to the dens. Soft tissues and spinal canal: No prevertebral fluid or swelling. No visible canal hematoma. Disc levels: Multilevel degenerative changes of the cervical spine most significant from C5-C7. Upper chest: Negative. Other: None IMPRESSION: 1. No acute intracranial findings. 2. No acute fracture or subluxation of the cervical spine. 3. Multilevel degenerative changes of the cervical spine most significant from C5-C7. Electronically Signed   By: Dahlia Bailiff MD   On: 08/23/2020 17:48   CT Cervical Spine Wo Contrast  Result Date: 08/23/2020 CLINICAL DATA:  Fall home today.  COVID positive EXAM: CT HEAD WITHOUT CONTRAST CT CERVICAL SPINE WITHOUT CONTRAST TECHNIQUE: Multidetector CT imaging of the head and cervical spine was performed following the standard protocol without intravenous contrast. Multiplanar CT image reconstructions of the cervical spine were also generated. COMPARISON:  CT January 22, 2019. FINDINGS: CT HEAD FINDINGS Brain: Unchanged mild age related atrophy with ex vacuo dilatation of the ventricular system. Similar microvascular ischemic change. No acute intracranial hemorrhage. No mass effect or midline shift. Vascular: No hyperdense vessel or unexpected calcification. Atherosclerotic calcifications of the bilateral carotid siphon. Skull: Normal. Negative for fracture or focal lesion. Sinuses/Orbits: Mild mucosal thickening in the ethmoid sinuses. Other: None. CT CERVICAL SPINE FINDINGS Alignment: Straightening normal cervical lordosis, likely positional. Skull base and vertebrae: No acute fracture. No primary bone lesion or focal pathologic process. Mineralized pannus posterior to the dens. Soft tissues and spinal canal: No prevertebral fluid or swelling. No visible canal hematoma. Disc levels: Multilevel degenerative changes of the cervical spine most significant from C5-C7. Upper chest: Negative.  Other: None IMPRESSION: 1. No acute intracranial findings. 2. No acute fracture or subluxation of the cervical spine. 3. Multilevel degenerative changes of the cervical spine most significant from C5-C7. Electronically Signed   By: Dahlia Bailiff MD   On: 08/23/2020 17:48   CT ABDOMEN PELVIS W CONTRAST  Result Date: 08/23/2020 CLINICAL DATA:  COVID positive. Abdominal distension. EXAM: CT ABDOMEN AND PELVIS WITH CONTRAST TECHNIQUE: Multidetector CT imaging of the abdomen and pelvis was performed using the standard protocol following bolus administration of intravenous contrast. CONTRAST:  18mL OMNIPAQUE IOHEXOL 300 MG/ML  SOLN COMPARISON:  Dec 07, 2018 FINDINGS: Lower chest: Bilateral predominantly peripheral and subpleural areas of ground-glass and patchy consolidation, consistent with COVID pneumonia. Mildly enlarged heart. Partially visualized cardiac pacemaker leads. Hepatobiliary: No focal liver abnormality is seen. Status post cholecystectomy. No biliary dilatation. Pancreas: Unremarkable. No pancreatic ductal dilatation or surrounding inflammatory changes. Spleen: Normal in size without focal abnormality. Adrenals/Urinary Tract: Multilobulated complicated cyst off of the lower pole of the right kidney measures 4.2 cm. Mild perinephric stranding bilaterally. No evidence of hydronephrosis 3 mm nonobstructive calculus in the midpole region of the left kidney. Normal adrenal glands. Stomach/Bowel: Normal appearance of the stomach. 3.7 cm duodenal diverticulum off of the first portion of the duodenum. Scattered left colonic diverticulosis without evidence of diverticulitis. Vascular/Lymphatic: Aortic atherosclerosis. No enlarged abdominal or pelvic lymph nodes. Reproductive: Mild enlargement of the prostate gland, which measures 4 cm transversely. Other: No abdominal wall hernia or abnormality. No abdominopelvic ascites. Musculoskeletal: Diffuse spondylosis of the lumbosacral spine. IMPRESSION: 1. Bilateral  predominantly peripheral and subpleural areas of ground-glass and patchy consolidation, consistent with COVID pneumonia. 2. No evidence of acute abnormalities within the abdomen or pelvis. 3. 3 mm nonobstructive left renal calculus. 4.  Multilobulated complicated cyst off of the lower pole of the right kidney measures 4.2 cm. 5. Scattered left colonic diverticulosis without evidence of diverticulitis. 6. Mild enlargement of the prostate gland. Please correlate to serum PSA values. 7. Aortic atherosclerosis. Aortic Atherosclerosis (ICD10-I70.0). Electronically Signed   By: Fidela Salisbury M.D.   On: 08/23/2020 17:48   DG Chest Port 1 View  Result Date: 08/23/2020 CLINICAL DATA:  Cough. EXAM: PORTABLE CHEST 1 VIEW COMPARISON:  March 10, 2018. FINDINGS: The heart size and mediastinal contours are within normal limits. Sternotomy wires are noted. Left-sided pacemaker is unchanged in position. No pneumothorax or pleural effusion is noted. Lungs are clear. The visualized skeletal structures are unremarkable. IMPRESSION: No active disease. Electronically Signed   By: Marijo Conception M.D.   On: 08/23/2020 15:30     Labs:   Basic Metabolic Panel: Recent Labs  Lab 08/24/20 0350 08/25/20 0258 08/26/20 0343 08/27/20 0353 08/28/20 0350  NA 136 133* 133* 135 135  K 3.9 4.1 4.4 4.4 4.6  CL 101 99 99 102 101  CO2 26 25 26 23 25   GLUCOSE 95 172* 200* 139* 137*  BUN 24* 24* 22 23 25*  CREATININE 1.00 0.92 0.86 0.97 0.99  CALCIUM 7.8* 8.0* 8.5* 8.4* 8.4*  MG 1.9 1.9 2.1 2.0 2.1  PHOS 1.5* 2.4* 2.8 2.2* 3.4   GFR Estimated Creatinine Clearance: 52.8 mL/min (by C-G formula based on SCr of 0.99 mg/dL). Liver Function Tests: Recent Labs  Lab 08/24/20 0350 08/25/20 0258 08/26/20 0343 08/27/20 0353 08/28/20 0350  AST 34 27 27 25 25   ALT 28 28 36 38 44  ALKPHOS 40 38 39 39 42  BILITOT 0.9 0.9 0.8 0.7 1.1  PROT 5.7* 5.5* 5.7* 5.4* 5.8*  ALBUMIN 3.1* 3.0* 3.1* 2.9* 3.0*   Recent Labs  Lab  08/23/20 1511  LIPASE 23   No results for input(s): AMMONIA in the last 168 hours. Coagulation profile No results for input(s): INR, PROTIME in the last 168 hours.  CBC: Recent Labs  Lab 08/24/20 0350 08/25/20 0258 08/26/20 0343 08/27/20 0353 08/28/20 0350  WBC 9.0 10.3 10.1 10.5 11.0*  NEUTROABS 7.6 8.6* 8.3* 7.6 8.3*  HGB 13.8 13.8 14.4 14.7 15.6  HCT 41.4 41.8 44.1 44.4 48.1  MCV 96.5 96.8 96.9 98.2 98.6  PLT 142* 160 183 222 252   Cardiac Enzymes: Recent Labs  Lab 08/23/20 1511 08/24/20 0350 08/27/20 0353 08/28/20 0350  CKTOTAL 588* 762* 74 69   BNP: Invalid input(s): POCBNP CBG: Recent Labs  Lab 08/27/20 1702 08/27/20 2148 08/27/20 2211 08/28/20 0747 08/28/20 1139  GLUCAP 187* 208* 168* 108* 133*   D-Dimer Recent Labs    08/27/20 0353 08/28/20 0350  DDIMER 0.55* 0.53*   Hgb A1c Recent Labs    08/26/20 0343  HGBA1C 6.4*   Lipid Profile No results for input(s): CHOL, HDL, LDLCALC, TRIG, CHOLHDL, LDLDIRECT in the last 72 hours. Thyroid function studies No results for input(s): TSH, T4TOTAL, T3FREE, THYROIDAB in the last 72 hours.  Invalid input(s): FREET3 Anemia work up Recent Labs    08/27/20 0353 08/28/20 0350  FERRITIN 140 117   Microbiology Recent Results (from the past 240 hour(s))  SARS Coronavirus 2 by RT PCR (hospital order, performed in [facility] hospital lab) Nasopharyngeal Nasopharyngeal Swab     Status: Abnormal   Collection Time: 08/23/20  3:28 PM   Specimen: Nasopharyngeal Swab  Result Value Ref Range Status   SARS Coronavirus 2 POSITIVE (A) NEGATIVE Final  Comment: RESULT CALLED TO, READ BACK BY AND VERIFIED WITH: SAVOIE,B. RN AT 1639 08/23/20 MULLINS,T (NOTE) SARS-CoV-2 target nucleic acids are DETECTED  SARS-CoV-2 RNA is generally detectable in upper respiratory specimens  during the acute phase of infection.  Positive results are indicative  of the presence of the identified virus, but do not rule  out bacterial infection or co-infection with other pathogens not detected by the test.  Clinical correlation with patient history and  other diagnostic information is necessary to determine patient infection status.  The expected result is negative.  Fact Sheet for Patients:   StrictlyIdeas.no   Fact Sheet for Healthcare Providers:   BankingDealers.co.za    This test is not yet approved or cleared by the [place] FDA and  has been authorized for detection and/or diagnosis of SARS-CoV-2 by FDA under an Emergency Use Authorization (EUA).  This EUA will remain in effect (meaning th is test can be used) for the duration of  the COVID-19 declaration under Section 564(b)(1) of the Act, 21 U.S.C. section 360-bbb-3(b)(1), unless the authorization is terminated or revoked sooner.  Performed at [facility], [place] [address] [place], [place] [address]   Blood Culture (routine x 2)     Status: None (Preliminary result)   Collection Time: 08/23/20  4:48 PM   Specimen: BLOOD  Result Value Ref Range Status   Specimen Description   Final    BLOOD RIGHT HAND Performed at [facility] [address] [place], [place] [address]    Special Requests   Final    BOTTLES DRAWN AEROBIC AND ANAEROBIC Blood Culture adequate volume Performed at [facility] [address] [place], [place] [address]    Culture   Final    NO GROWTH 4 DAYS Performed at [facility], [place] [address], [place], [place] [address]    Report Status PENDING  Incomplete  Blood Culture (routine x 2)     Status: None (Preliminary result)   Collection Time: 08/23/20  4:53 PM   Specimen: BLOOD  Result Value Ref Range Status   Specimen Description   Final    BLOOD LEFT ARM Performed at [facility] [address] [place], [place] [address]    Special Requests   Final    BOTTLES DRAWN AEROBIC  AND ANAEROBIC Blood Culture adequate volume Performed at [facility] [address] [place], [place] [address]    Culture   Final    NO GROWTH 4 DAYS Performed at [facility], [place] [address], [place], [place] [address]    Report Status PENDING  Incomplete  Urine Culture     Status: Abnormal   Collection Time: 08/23/20  9:31 PM   Specimen: Urine, Random  Result Value Ref Range Status   Specimen Description   Final    URINE, RANDOM Performed at [facility] [facility] [address] [place], [place] [address]    Special Requests   Final    NONE Performed at [facility], [place] [address], [place], [place] [address]    Culture MULTIPLE SPECIES PRESENT, SUGGEST RECOLLECTION (A)  Final   Report Status 08/25/2020 FINAL  Final     Signed: Marlowe Aschoff Dimitri Dsouza  [facility] 08/28/2020, 1:36 PM

## 2020-08-28 LAB — CBC WITH DIFFERENTIAL/PLATELET
Abs Immature Granulocytes: 0.21 10*3/uL — ABNORMAL HIGH (ref 0.00–0.07)
Basophils Absolute: 0.1 10*3/uL (ref 0.0–0.1)
Basophils Relative: 1 %
Eosinophils Absolute: 0 10*3/uL (ref 0.0–0.5)
Eosinophils Relative: 0 %
HCT: 48.1 % (ref 39.0–52.0)
Hemoglobin: 15.6 g/dL (ref 13.0–17.0)
Immature Granulocytes: 2 %
Lymphocytes Relative: 13 %
Lymphs Abs: 1.4 10*3/uL (ref 0.7–4.0)
MCH: 32 pg (ref 26.0–34.0)
MCHC: 32.4 g/dL (ref 30.0–36.0)
MCV: 98.6 fL (ref 80.0–100.0)
Monocytes Absolute: 1 10*3/uL (ref 0.1–1.0)
Monocytes Relative: 10 %
Neutro Abs: 8.3 10*3/uL — ABNORMAL HIGH (ref 1.7–7.7)
Neutrophils Relative %: 74 %
Platelets: 252 10*3/uL (ref 150–400)
RBC: 4.88 MIL/uL (ref 4.22–5.81)
RDW: 13.7 % (ref 11.5–15.5)
WBC: 11 10*3/uL — ABNORMAL HIGH (ref 4.0–10.5)
nRBC: 0 % (ref 0.0–0.2)

## 2020-08-28 LAB — CK: Total CK: 69 U/L (ref 49–397)

## 2020-08-28 LAB — COMPREHENSIVE METABOLIC PANEL
ALT: 44 U/L (ref 0–44)
AST: 25 U/L (ref 15–41)
Albumin: 3 g/dL — ABNORMAL LOW (ref 3.5–5.0)
Alkaline Phosphatase: 42 U/L (ref 38–126)
Anion gap: 9 (ref 5–15)
BUN: 25 mg/dL — ABNORMAL HIGH (ref 8–23)
CO2: 25 mmol/L (ref 22–32)
Calcium: 8.4 mg/dL — ABNORMAL LOW (ref 8.9–10.3)
Chloride: 101 mmol/L (ref 98–111)
Creatinine, Ser: 0.99 mg/dL (ref 0.61–1.24)
GFR, Estimated: >60 mL/min (ref >60)
Glucose, Bld: 137 mg/dL — ABNORMAL HIGH (ref 70–99)
Potassium: 4.6 mmol/L (ref 3.5–5.1)
Sodium: 135 mmol/L (ref 135–145)
Total Bilirubin: 1.1 mg/dL (ref 0.3–1.2)
Total Protein: 5.8 g/dL — ABNORMAL LOW (ref 6.5–8.1)

## 2020-08-28 LAB — PHOSPHORUS: Phosphorus: 3.4 mg/dL (ref 2.5–4.6)

## 2020-08-28 LAB — GLUCOSE, CAPILLARY
Glucose-Capillary: 108 mg/dL — ABNORMAL HIGH (ref 70–99)
Glucose-Capillary: 133 mg/dL — ABNORMAL HIGH (ref 70–99)
Glucose-Capillary: 236 mg/dL — ABNORMAL HIGH (ref 70–99)
Glucose-Capillary: 244 mg/dL — ABNORMAL HIGH (ref 70–99)

## 2020-08-28 LAB — C-REACTIVE PROTEIN: CRP: 2.3 mg/dL — ABNORMAL HIGH (ref <1.0)

## 2020-08-28 LAB — D-DIMER, QUANTITATIVE: D-Dimer, Quant: 0.53 ug/mL-FEU — ABNORMAL HIGH (ref 0.00–0.50)

## 2020-08-28 LAB — FERRITIN: Ferritin: 117 ng/mL (ref 24–336)

## 2020-08-28 LAB — MAGNESIUM: Magnesium: 2.1 mg/dL (ref 1.7–2.4)

## 2020-08-28 MED ORDER — FUROSEMIDE 40 MG PO TABS: 40.0000 mg | ORAL_TABLET | Freq: Every day | ORAL | Status: DC | PRN

## 2020-08-28 NOTE — Progress Notes (Signed)
Pt left unit with [facility] to [facility] facility. Pt has his belongings and family has been notified.

## 2020-08-28 NOTE — Plan of Care (Signed)
  Problem: Respiratory: Goal: Will maintain a patent airway Outcome: Adequate for Discharge   Problem: Respiratory: Goal: Complications related to the disease process, condition or treatment will be avoided or minimized Outcome: Adequate for Discharge   

## 2020-08-28 NOTE — Progress Notes (Signed)
Patient was prepared for discharge to home yesterday. Patient and family changed her mind and wanted to go to a short-term rehab. Patient's wife is a long-term resident at [facility] and he chose to go there. Insurance authorization achieved. Patient can be discharged to facility today. Patient was briefly examined this morning. Remains stable. On room air. Feels great. We will not bill for today's visit.

## 2020-08-28 NOTE — Plan of Care (Signed)
  Problem: Respiratory: Goal: Will maintain a patent airway 08/28/2020 1733 by Lesle Chris, RN Outcome: Adequate for Discharge 08/28/2020 1446 by Lesle Chris, RN Outcome: Adequate for Discharge   Problem: Clinical Measurements: Goal: Respiratory complications will improve 08/28/2020 1733 by Lesle Chris, RN Outcome: Adequate for Discharge 08/28/2020 1446 by Lesle Chris, RN Outcome: Adequate for Discharge   Problem: Activity: Goal: Risk for activity intolerance will decrease 08/28/2020 1733 by Lesle Chris, RN Outcome: Adequate for Discharge 08/28/2020 1446 by Lesle Chris, RN Outcome: Adequate for Discharge   Problem: Nutrition: Goal: Adequate nutrition will be maintained 08/28/2020 1733 by Lesle Chris, RN Outcome: Adequate for Discharge 08/28/2020 1446 by Lesle Chris, RN Outcome: Adequate for Discharge

## 2020-08-28 NOTE — TOC Transition Note (Addendum)
Transition of Care [facility]) - CM/SW Discharge Note   Patient Details  Name: [facility]. MRN: [id number] Date of Birth: 21-Jun-1933  Transition of Care [facility]) CM/SW Contact:  Trish Mage, LCSW Phone Number: 08/28/2020, 11:34 AM   Clinical Narrative:   [name] has made an exception to the mandatory vaccination policy for this patient since his wife is a resident there-they have a bed today.  Insurance authorization applied for with [employer]; reference [id number]. PTAR arranged.  Nursing, please call report to [phone], room 805A.  TOC sign off.    Final next level of care: Skilled Nursing Facility Barriers to Discharge: Barriers Resolved   Patient Goals and CMS Choice     Choice offered to / list presented to : Patient  Discharge Placement                       Discharge Plan and Services   Discharge Planning Services: CM Consult Post Acute Care Choice: Home Health                               Social Determinants of Health (SDOH) Interventions     Readmission Risk Interventions Readmission Risk Prevention Plan 03/10/2018  Post Dischage Appt Complete  Medication Screening Complete  Transportation Screening Complete  PCP follow-up Complete  Some recent data might be hidden

## 2020-08-28 NOTE — Progress Notes (Signed)
Occupational Therapy Treatment Patient Details Name: [facility]. MRN: [id number] DOB: 1932-12-03 Today's Date: 08/28/2020    History of present illness 87 yo male admitted with N/V after falling at home. Dx: Covid. Hx of pacemaker, tremor, CABG, CAD   OT comments  Pt received sitting up in chair, agreeable to OT session prior to DC to SNF. Pt set up for oral care and use of urinal. Min guard for stand pivot from chair to bed. Pt currently on RA with O2 sats around 90-95. No reports of fatigue or SOB. Pt is slightly unsteady with transfers and requires additional education for safety and use of AE for stability with functional tasks. Pt to be dc to SNF and can benefit from skilled level OT to address established deficits to maximize independence with ADLs.     Follow Up Recommendations  SNF    Equipment Recommendations  3 in 1 bedside commode    Recommendations for Other Services      Precautions / Restrictions Precautions Precautions: Fall Precaution Comments: Airborn/Contact Restrictions Weight Bearing Restrictions: No       Mobility Bed Mobility Overal bed mobility: Modified Independent             General bed mobility comments: no physical assistance required to present BLE into ed.  Transfers Overall transfer level: Needs assistance Equipment used: Rolling walker (2 wheeled) Transfers: Sit to/from Stand Sit to Stand: Min guard         General transfer comment: no physical assistance required from OT, min guard sit to stand and stand pivot to good hand placement to reach prior to sitting. A little unsteady but no LOB.    Balance Overall balance assessment: Needs assistance Sitting-balance support: Feet supported;Single extremity supported Sitting balance-Leahy Scale: Good       Standing balance-Leahy Scale: Poor Standing balance comment: Fair static, and poor dynamic with need of RW during ambulation of dynamic standing ADLs                            ADL either performed or assessed with clinical judgement   ADL Overall ADL's : Needs assistance/impaired     Grooming: Sitting;Set up Grooming Details (indicate cue type and reason): tremors observed but no diffculties observed managing opening toothpaste container.                 Toilet Transfer: Engineer, drilling Details (indicate cue type and reason): simulated toilet transfer from chair on RA with O2 sats ranging at 90-95% no c/o of SOB Toileting- Clothing Manipulation and Hygiene: Set up;Sitting/lateral lean Toileting - Clothing Manipulation Details (indicate cue type and reason): use of urinal.             Vision   Vision Assessment?: No apparent visual deficits   Perception     Praxis      Cognition Arousal/Alertness: Awake/alert Behavior During Therapy: WFL for tasks assessed/performed Overall Cognitive Status: Within Functional Limits for tasks assessed                                          Exercises Exercises: Other exercises Other Exercises Other Exercises: 5 reps bil LE heel raise and toe raises in standing with RW for support; min assis tto steady Other Exercises: 15 reps sit<>stand no UE use for power up, min  assist to steady with rise. pt using bil LE against chair to steady. Other Exercises: 10 reps IS for breathign, cues to breath in slowly   Shoulder Instructions       General Comments      Pertinent Vitals/ Pain       Pain Assessment: No/denies pain  Home Living                                          Prior Functioning/Environment              Frequency  Min 2X/week        Progress Toward Goals  OT Goals(current goals can now be found in the care plan section)  Progress towards OT goals: Progressing toward goals  Acute Rehab OT Goals Patient Stated Goal: to get better and get home soon OT Goal Formulation: With patient Time For Goal  Achievement: 09/08/20 Potential to Achieve Goals: Good ADL Goals Pt Will Perform Lower Body Dressing: with adaptive equipment;with modified independence;sit to/from stand Pt Will Transfer to Toilet: with modified independence;ambulating Pt Will Perform Toileting - Clothing Manipulation and hygiene: with modified independence;sit to/from stand;sitting/lateral leans Additional ADL Goal #1: Pt will engage in 8 min standing functional activities without loss of balance and with vitals stable, in order to demonstrate improved activity tolerance and balance needed to perform ADLs safely at home. Additional ADL Goal #2: Patient will tolerate BUE home exercise program 10-15 reps within pain-free ranges, in an unsupported seated position, in order to improve upper body strength, endurance and core stability needed to complete home ADLs with SpO2 maintaining at or above 90%. Additional ADL Goal #3: Pt will identify at least 3 energy conservation techniques to employ at home in order to maximize function and prevent relapse and rehospitalization.  Plan Discharge plan remains appropriate;Frequency remains appropriate    Co-evaluation                 AM-PAC OT "6 Clicks" Daily Activity     Outcome Measure   Help from another person eating meals?: A Little Help from another person taking care of personal grooming?: A Little Help from another person toileting, which includes using toliet, bedpan, or urinal?: A Little Help from another person bathing (including washing, rinsing, drying)?: A Little Help from another person to put on and taking off regular upper body clothing?: A Little Help from another person to put on and taking off regular lower body clothing?: A Little 6 Click Score: 18    End of Session Equipment Utilized During Treatment: Gait belt;Rolling walker  OT Visit Diagnosis: Unsteadiness on feet (R26.81);History of falling (Z91.81);Muscle weakness (generalized) (M62.81);Repeated  falls (R29.6);Feeding difficulties (R63.3)   Activity Tolerance Patient tolerated treatment well   Patient Left in chair;with call bell/phone within reach;with chair alarm set   Nurse Communication Mobility status        Time: 2355-7322 OT Time Calculation (min): 31 min  Charges: OT General Charges $OT Visit: 1 Visit OT Treatments $Self Care/Home Management : 23-37 mins  Minus Breeding, MSOT, OTR/L  Supplemental Rehabilitation Services  [phone]  Marius Ditch 08/28/2020, 2:49 PM

## 2020-08-28 NOTE — Care Management Important Message (Signed)
Important Message  Patient Details IM Letter placed in Patient's door Caddy. Name: [facility]. MRN: [id number] Date of Birth: 07/04/1933   Medicare Important Message Given:  Yes     Kerin Salen 08/28/2020, 12:19 PM

## 2020-08-28 NOTE — Progress Notes (Signed)
Called and gave report to RN at [facility]. Pt is waiting for PTAR to be transported.

## 2020-08-28 NOTE — Progress Notes (Signed)
Physical Therapy Treatment Patient Details Name: [facility]. MRN: [id number] DOB: 1933-04-06 Today's Date: 08/28/2020    History of Present Illness 87 yo male admitted with N/V after falling at home. Dx: Covid. Hx of pacemaker, tremor, CABG, CAD    PT Comments    Patient making steady progress with acute PT. He ambulated increased distance ~90' today with RW. One LOB noted requiring min assist to steady and min guard throughout with cues for safe walker positioning. Patient initiated functional LE exercises for LE strengthening and reviewed use of incentive spirometer for breathing. Pt HR in 90's with activity and Sats >93% on RA during gait with one drop to 88% during repeat sit<>stands. Acute PT will continue to follow patient as able. As pt lives alone and remains unsteady he expressed concerns about returning home. Recommendations updated to SNF follow up for rehab.   Follow Up Recommendations  SNF     Equipment Recommendations  None recommended by PT    Recommendations for Other Services OT consult     Precautions / Restrictions Precautions Precautions: Fall Precaution Comments: Airborn/Contact Restrictions Weight Bearing Restrictions: No    Mobility  Bed Mobility               General bed mobility comments: Pt in chair.    Transfers Overall transfer level: Needs assistance Equipment used: Rolling walker (2 wheeled) Transfers: Sit to/from Stand Sit to Stand: Min guard;Min assist         General transfer comment: cues for hand placement with RW, min guard for safety with pt using UE's for power up. Min assist/guard for repeat sit<>stands without UE for power up.  Ambulation/Gait Ambulation/Gait assistance: Min guard;Min assist Gait Distance (Feet): 90 Feet Assistive device: Rolling walker (2 wheeled) Gait Pattern/deviations: Step-through pattern;Decreased stride length Gait velocity: decr   General Gait Details: pt with 1 LOB requiring min assist  to steady. min guard with cues for safe proximity to RW throughout. Pt's HR on 90's during gait, SpO2 >93% on RA.   Stairs             Wheelchair Mobility    Modified Rankin (Stroke Patients Only)       Balance Overall balance assessment: Needs assistance Sitting-balance support: Feet supported;Single extremity supported Sitting balance-Leahy Scale: Good       Standing balance-Leahy Scale: Poor Standing balance comment: Fair static, and poor dynamic with need of RW during ambulation of dynamic standing ADLs                            Cognition Arousal/Alertness: Awake/alert Behavior During Therapy: WFL for tasks assessed/performed Overall Cognitive Status: Within Functional Limits for tasks assessed                                        Exercises Other Exercises Other Exercises: 5 reps bil LE heel raise and toe raises in standing with RW for support; min assis tto steady Other Exercises: 15 reps sit<>stand no UE use for power up, min assist to steady with rise. pt using bil LE against chair to steady. Other Exercises: 10 reps IS for breathign, cues to breath in slowly    General Comments        Pertinent Vitals/Pain Pain Assessment: No/denies pain    Home Living  Prior Function            PT Goals (current goals can now be found in the care plan section) Acute Rehab PT Goals Patient Stated Goal: to get better and get home soon PT Goal Formulation: With patient Time For Goal Achievement: 09/08/20 Potential to Achieve Goals: Good Progress towards PT goals: Progressing toward goals    Frequency    Min 3X/week      PT Plan Current plan remains appropriate    Co-evaluation              AM-PAC PT "6 Clicks" Mobility   Outcome Measure  Help needed turning from your back to your side while in a flat bed without using bedrails?: None Help needed moving from lying on your back to  sitting on the side of a flat bed without using bedrails?: A Little Help needed moving to and from a bed to a chair (including a wheelchair)?: A Little Help needed standing up from a chair using your arms (e.g., wheelchair or bedside chair)?: A Little Help needed to walk in hospital room?: A Little Help needed climbing 3-5 steps with a railing? : A Little 6 Click Score: 19    End of Session Equipment Utilized During Treatment: Gait belt Activity Tolerance: Patient tolerated treatment well Patient left: in chair;with call bell/phone within reach;with chair alarm set Nurse Communication: Mobility status PT Visit Diagnosis: Muscle weakness (generalized) (M62.81);Unsteadiness on feet (R26.81);History of falling (Z91.81)     Time: 1914-7829 PT Time Calculation (min) (ACUTE ONLY): 29 min  Charges:  $Gait Training: 8-22 mins $Therapeutic Exercise: 8-22 mins                     Verner Mould, DPT Acute Rehabilitation Services Office [phone] Pager [phone]     Jacques Navy 08/28/2020, 2:02 PM

## 2020-08-29 DIAGNOSIS — I509 Heart failure, unspecified: Secondary | ICD-10-CM | POA: Diagnosis not present

## 2020-08-29 DIAGNOSIS — M6281 Muscle weakness (generalized): Secondary | ICD-10-CM | POA: Diagnosis not present

## 2020-08-29 DIAGNOSIS — I2510 Atherosclerotic heart disease of native coronary artery without angina pectoris: Secondary | ICD-10-CM | POA: Diagnosis not present

## 2020-08-29 DIAGNOSIS — J1282 Pneumonia due to coronavirus disease 2019: Secondary | ICD-10-CM | POA: Diagnosis not present

## 2020-08-29 DIAGNOSIS — U071 COVID-19: Secondary | ICD-10-CM | POA: Diagnosis not present

## 2020-08-29 DIAGNOSIS — I5040 Unspecified combined systolic (congestive) and diastolic (congestive) heart failure: Secondary | ICD-10-CM | POA: Diagnosis not present

## 2020-08-29 DIAGNOSIS — I10 Essential (primary) hypertension: Secondary | ICD-10-CM | POA: Diagnosis not present

## 2020-08-29 DIAGNOSIS — R2681 Unsteadiness on feet: Secondary | ICD-10-CM | POA: Diagnosis not present

## 2020-08-29 LAB — CULTURE, BLOOD (ROUTINE X 2)
Culture: NO GROWTH
Culture: NO GROWTH
Special Requests: ADEQUATE
Special Requests: ADEQUATE

## 2020-09-01 DIAGNOSIS — G250 Essential tremor: Secondary | ICD-10-CM | POA: Diagnosis not present

## 2020-09-01 DIAGNOSIS — W1789XA Other fall from one level to another, initial encounter: Secondary | ICD-10-CM | POA: Diagnosis not present

## 2020-09-01 DIAGNOSIS — R269 Unspecified abnormalities of gait and mobility: Secondary | ICD-10-CM | POA: Diagnosis not present

## 2020-09-01 DIAGNOSIS — R2681 Unsteadiness on feet: Secondary | ICD-10-CM | POA: Diagnosis not present

## 2020-09-01 DIAGNOSIS — D649 Anemia, unspecified: Secondary | ICD-10-CM | POA: Diagnosis not present

## 2020-09-01 DIAGNOSIS — F411 Generalized anxiety disorder: Secondary | ICD-10-CM | POA: Diagnosis not present

## 2020-09-01 DIAGNOSIS — N390 Urinary tract infection, site not specified: Secondary | ICD-10-CM | POA: Diagnosis not present

## 2020-09-01 DIAGNOSIS — E785 Hyperlipidemia, unspecified: Secondary | ICD-10-CM | POA: Diagnosis not present

## 2020-09-01 DIAGNOSIS — M6281 Muscle weakness (generalized): Secondary | ICD-10-CM | POA: Diagnosis not present

## 2020-09-01 DIAGNOSIS — U071 COVID-19: Secondary | ICD-10-CM | POA: Diagnosis not present

## 2020-09-01 DIAGNOSIS — R2689 Other abnormalities of gait and mobility: Secondary | ICD-10-CM | POA: Diagnosis not present

## 2020-09-01 DIAGNOSIS — I509 Heart failure, unspecified: Secondary | ICD-10-CM | POA: Diagnosis not present

## 2020-09-01 DIAGNOSIS — F331 Major depressive disorder, recurrent, moderate: Secondary | ICD-10-CM | POA: Diagnosis not present

## 2020-09-01 DIAGNOSIS — I480 Paroxysmal atrial fibrillation: Secondary | ICD-10-CM | POA: Diagnosis not present

## 2020-09-01 DIAGNOSIS — I5041 Acute combined systolic (congestive) and diastolic (congestive) heart failure: Secondary | ICD-10-CM | POA: Diagnosis not present

## 2020-09-01 DIAGNOSIS — M6258 Muscle wasting and atrophy, not elsewhere classified, other site: Secondary | ICD-10-CM | POA: Diagnosis not present

## 2020-09-01 DIAGNOSIS — R498 Other voice and resonance disorders: Secondary | ICD-10-CM | POA: Diagnosis not present

## 2020-09-01 DIAGNOSIS — R442 Other hallucinations: Secondary | ICD-10-CM | POA: Diagnosis not present

## 2020-09-01 DIAGNOSIS — R278 Other lack of coordination: Secondary | ICD-10-CM | POA: Diagnosis not present

## 2020-09-01 DIAGNOSIS — R41841 Cognitive communication deficit: Secondary | ICD-10-CM | POA: Diagnosis not present

## 2020-09-01 DIAGNOSIS — I10 Essential (primary) hypertension: Secondary | ICD-10-CM | POA: Diagnosis not present

## 2020-09-02 ENCOUNTER — Telehealth: Payer: Self-pay | Admitting: *Deleted

## 2020-09-02 DIAGNOSIS — E785 Hyperlipidemia, unspecified: Secondary | ICD-10-CM | POA: Diagnosis not present

## 2020-09-02 DIAGNOSIS — U071 COVID-19: Secondary | ICD-10-CM | POA: Diagnosis not present

## 2020-09-02 DIAGNOSIS — I480 Paroxysmal atrial fibrillation: Secondary | ICD-10-CM | POA: Diagnosis not present

## 2020-09-02 DIAGNOSIS — I5041 Acute combined systolic (congestive) and diastolic (congestive) heart failure: Secondary | ICD-10-CM | POA: Diagnosis not present

## 2020-09-02 NOTE — Telephone Encounter (Signed)
Called patient on 09/02/2020 , 12:16 PM in an attempt to reach the patient for a hospital follow up. Spoke with patient, who is currently in [facility].  Admit date: 08/23/20 Discharge: 08/28/20   He does not have any questions or concerns about medications from the hospital admission. The patient's medications were reviewed over the phone, they were counseled to bring in all current medications to the hospital follow up visit.   I advised the patient to call if any questions or concerns arise about the hospital admission or medications    Home health was not started in the hospital.  All questions were answered and a follow up appointment was made. Patient asked to call our office at discharge to schedule an 7 to 10 day follow up visit. Patient states he will not be able to remember to call, so a note has been mailed to his home as a reminder to call at discharge.  Prior to Admission medications   Medication Sig Start Date End Date Taking? Authorizing Provider  atorvastatin (LIPITOR) 40 MG tablet Take 1 tablet daily for Cholesterol Patient taking differently: Take 40 mg by mouth daily. for Cholesterol 10/01/19   Liane Comber, NP  buPROPion (WELLBUTRIN XL) 300 MG 24 hr tablet Take 1 tablet daily for Mood Patient taking differently: Take 300 mg by mouth daily. for Mood 12/29/19   Unk Pinto, MD  Cholecalciferol (VITAMIN D) 125 MCG (5000 UT) CAPS Take 5,000 Units by mouth daily.    [provider]  citalopram (CELEXA) 40 MG tablet Take 1 tablet (40 mg total) by mouth daily. 10/08/19 10/07/20  Vicie Mutters R, PA-C  ELIQUIS 5 MG TABS tablet TAKE 1 TABLET BY MOUTH  TWICE DAILY Patient taking differently: Take 5 mg by mouth 2 (two) times daily. 04/07/20   Croitoru, Mihai, MD  furosemide (LASIX) 40 MG tablet Take 1 tablet (40 mg total) by mouth daily as needed for fluid or edema. 08/28/20   Dahal, Marlowe Aschoff, MD  gabapentin (NEURONTIN) 300 MG capsule TAKE 1 CAPSULE BY MOUTH 3   TIMES DAILY AS NEEDED FOR  CHRONIC PAIN Patient taking differently: Take 300 mg by mouth 3 (three) times daily as needed (chronic pain). 03/17/20   Liane Comber, NP  guaiFENesin-dextromethorphan (ROBITUSSIN DM) 100-10 MG/5ML syrup Take 10 mLs by mouth every 4 (four) hours as needed for cough. 08/27/20   Terrilee Croak, MD  losartan (COZAAR) 25 MG tablet Take 0.5 tablets (12.5 mg total) by mouth daily. 10/05/19 03/25/20  Croitoru, Mihai, MD  metoprolol succinate (TOPROL-XL) 25 MG 24 hr tablet Take 1 tablet Daily for BP & Heart Patient taking differently: Take 25 mg by mouth daily. for BP & Heart 10/01/19   Vladimir Crofts, PA-C  nitroGLYCERIN (NITROSTAT) 0.4 MG SL tablet Place 1 tablet (0.4 mg total) under the tongue every 5 (five) minutes as needed for chest pain. 12/25/19   Unk Pinto, MD  omeprazole (PRILOSEC OTC) 20 MG tablet Take 20 mg by mouth daily as needed (acid reflux).    [provider]  OVER THE COUNTER MEDICATION Take 2 tablets by mouth daily. Magnesium Broad Spectrum Complex for muscle, nerve, bone health    [provider]  OVER THE COUNTER MEDICATION Take 2 capsules by mouth daily. Omega Q with resveratrol and turmeric    [provider]  potassium chloride SA (KLOR-CON) 20 MEQ tablet Take 40 mEq by mouth daily.    [provider]  predniSONE (DELTASONE) 10 MG tablet Take  4 tablets daily X 2 days, then, Take 3 tablets daily X 2 days, then, Take 2 tablets daily X 2 days, then, Take 1 tablets daily X 1 day. 08/27/20   Terrilee Croak, MD

## 2020-09-04 DIAGNOSIS — M6281 Muscle weakness (generalized): Secondary | ICD-10-CM | POA: Diagnosis not present

## 2020-09-04 DIAGNOSIS — I509 Heart failure, unspecified: Secondary | ICD-10-CM | POA: Diagnosis not present

## 2020-09-04 DIAGNOSIS — I10 Essential (primary) hypertension: Secondary | ICD-10-CM | POA: Diagnosis not present

## 2020-09-04 DIAGNOSIS — R2681 Unsteadiness on feet: Secondary | ICD-10-CM | POA: Diagnosis not present

## 2020-09-08 DIAGNOSIS — I509 Heart failure, unspecified: Secondary | ICD-10-CM | POA: Diagnosis not present

## 2020-09-08 DIAGNOSIS — R2681 Unsteadiness on feet: Secondary | ICD-10-CM | POA: Diagnosis not present

## 2020-09-08 DIAGNOSIS — I10 Essential (primary) hypertension: Secondary | ICD-10-CM | POA: Diagnosis not present

## 2020-09-08 DIAGNOSIS — M6281 Muscle weakness (generalized): Secondary | ICD-10-CM | POA: Diagnosis not present

## 2020-09-09 DIAGNOSIS — R442 Other hallucinations: Secondary | ICD-10-CM | POA: Diagnosis not present

## 2020-09-09 DIAGNOSIS — W1789XA Other fall from one level to another, initial encounter: Secondary | ICD-10-CM | POA: Diagnosis not present

## 2020-09-09 DIAGNOSIS — G250 Essential tremor: Secondary | ICD-10-CM | POA: Diagnosis not present

## 2020-09-10 DIAGNOSIS — W1789XA Other fall from one level to another, initial encounter: Secondary | ICD-10-CM | POA: Diagnosis not present

## 2020-09-10 DIAGNOSIS — G250 Essential tremor: Secondary | ICD-10-CM | POA: Diagnosis not present

## 2020-09-10 DIAGNOSIS — U071 COVID-19: Secondary | ICD-10-CM | POA: Diagnosis not present

## 2020-09-10 DIAGNOSIS — R442 Other hallucinations: Secondary | ICD-10-CM | POA: Diagnosis not present

## 2020-09-11 DIAGNOSIS — I10 Essential (primary) hypertension: Secondary | ICD-10-CM | POA: Diagnosis not present

## 2020-09-11 DIAGNOSIS — R2681 Unsteadiness on feet: Secondary | ICD-10-CM | POA: Diagnosis not present

## 2020-09-11 DIAGNOSIS — M6281 Muscle weakness (generalized): Secondary | ICD-10-CM | POA: Diagnosis not present

## 2020-09-11 DIAGNOSIS — I509 Heart failure, unspecified: Secondary | ICD-10-CM | POA: Diagnosis not present

## 2020-09-15 DIAGNOSIS — R269 Unspecified abnormalities of gait and mobility: Secondary | ICD-10-CM | POA: Diagnosis not present

## 2020-09-15 DIAGNOSIS — R2689 Other abnormalities of gait and mobility: Secondary | ICD-10-CM | POA: Diagnosis not present

## 2020-09-15 DIAGNOSIS — R41841 Cognitive communication deficit: Secondary | ICD-10-CM | POA: Diagnosis not present

## 2020-09-15 DIAGNOSIS — I509 Heart failure, unspecified: Secondary | ICD-10-CM | POA: Diagnosis not present

## 2020-09-15 DIAGNOSIS — Z8673 Personal history of transient ischemic attack (TIA), and cerebral infarction without residual deficits: Secondary | ICD-10-CM | POA: Diagnosis not present

## 2020-09-15 DIAGNOSIS — R278 Other lack of coordination: Secondary | ICD-10-CM | POA: Diagnosis not present

## 2020-09-15 DIAGNOSIS — M6281 Muscle weakness (generalized): Secondary | ICD-10-CM | POA: Diagnosis not present

## 2020-09-15 DIAGNOSIS — I10 Essential (primary) hypertension: Secondary | ICD-10-CM | POA: Diagnosis not present

## 2020-09-15 DIAGNOSIS — R2681 Unsteadiness on feet: Secondary | ICD-10-CM | POA: Diagnosis not present

## 2020-09-15 DIAGNOSIS — R498 Other voice and resonance disorders: Secondary | ICD-10-CM | POA: Diagnosis not present

## 2020-09-15 DIAGNOSIS — M6258 Muscle wasting and atrophy, not elsewhere classified, other site: Secondary | ICD-10-CM | POA: Diagnosis not present

## 2020-09-15 DIAGNOSIS — Z9181 History of falling: Secondary | ICD-10-CM | POA: Diagnosis not present

## 2020-09-16 DIAGNOSIS — R2689 Other abnormalities of gait and mobility: Secondary | ICD-10-CM | POA: Diagnosis not present

## 2020-09-16 DIAGNOSIS — R498 Other voice and resonance disorders: Secondary | ICD-10-CM | POA: Diagnosis not present

## 2020-09-16 DIAGNOSIS — Z9181 History of falling: Secondary | ICD-10-CM | POA: Diagnosis not present

## 2020-09-16 DIAGNOSIS — Z8673 Personal history of transient ischemic attack (TIA), and cerebral infarction without residual deficits: Secondary | ICD-10-CM | POA: Diagnosis not present

## 2020-09-16 DIAGNOSIS — R2681 Unsteadiness on feet: Secondary | ICD-10-CM | POA: Diagnosis not present

## 2020-09-16 DIAGNOSIS — R278 Other lack of coordination: Secondary | ICD-10-CM | POA: Diagnosis not present

## 2020-09-16 DIAGNOSIS — R41841 Cognitive communication deficit: Secondary | ICD-10-CM | POA: Diagnosis not present

## 2020-09-16 DIAGNOSIS — M6281 Muscle weakness (generalized): Secondary | ICD-10-CM | POA: Diagnosis not present

## 2020-09-16 DIAGNOSIS — M6258 Muscle wasting and atrophy, not elsewhere classified, other site: Secondary | ICD-10-CM | POA: Diagnosis not present

## 2020-09-16 DIAGNOSIS — R269 Unspecified abnormalities of gait and mobility: Secondary | ICD-10-CM | POA: Diagnosis not present

## 2020-09-17 ENCOUNTER — Ambulatory Visit (INDEPENDENT_AMBULATORY_CARE_PROVIDER_SITE_OTHER): Payer: Medicare Other

## 2020-09-17 ENCOUNTER — Telehealth: Payer: Self-pay | Admitting: *Deleted

## 2020-09-17 DIAGNOSIS — R278 Other lack of coordination: Secondary | ICD-10-CM | POA: Diagnosis not present

## 2020-09-17 DIAGNOSIS — R2689 Other abnormalities of gait and mobility: Secondary | ICD-10-CM | POA: Diagnosis not present

## 2020-09-17 DIAGNOSIS — R498 Other voice and resonance disorders: Secondary | ICD-10-CM | POA: Diagnosis not present

## 2020-09-17 DIAGNOSIS — M6258 Muscle wasting and atrophy, not elsewhere classified, other site: Secondary | ICD-10-CM | POA: Diagnosis not present

## 2020-09-17 DIAGNOSIS — Z9181 History of falling: Secondary | ICD-10-CM | POA: Diagnosis not present

## 2020-09-17 DIAGNOSIS — Z8673 Personal history of transient ischemic attack (TIA), and cerebral infarction without residual deficits: Secondary | ICD-10-CM | POA: Diagnosis not present

## 2020-09-17 DIAGNOSIS — I442 Atrioventricular block, complete: Secondary | ICD-10-CM | POA: Diagnosis not present

## 2020-09-17 DIAGNOSIS — M6281 Muscle weakness (generalized): Secondary | ICD-10-CM | POA: Diagnosis not present

## 2020-09-17 DIAGNOSIS — R269 Unspecified abnormalities of gait and mobility: Secondary | ICD-10-CM | POA: Diagnosis not present

## 2020-09-17 DIAGNOSIS — R2681 Unsteadiness on feet: Secondary | ICD-10-CM | POA: Diagnosis not present

## 2020-09-17 DIAGNOSIS — R41841 Cognitive communication deficit: Secondary | ICD-10-CM | POA: Diagnosis not present

## 2020-09-17 LAB — CUP PACEART REMOTE DEVICE CHECK
Battery Remaining Longevity: 73 mo
Battery Voltage: 2.97 V
Brady Statistic AP VP Percent: 64.81 %
Brady Statistic AP VS Percent: 0.12 %
Brady Statistic AS VP Percent: 33.78 %
Brady Statistic AS VS Percent: 1.31 %
Brady Statistic RA Percent Paced: 58.37 %
Brady Statistic RV Percent Paced: 93.65 %
Date Time Interrogation Session: 20220301142116
Implantable Lead Implant Date: 20190822
Implantable Lead Implant Date: 20190822
Implantable Lead Location: 753859
Implantable Lead Location: 753860
Implantable Lead Model: 3830
Implantable Lead Model: 5076
Implantable Pulse Generator Implant Date: 20190822
Lead Channel Impedance Value: 323 Ohm
Lead Channel Impedance Value: 342 Ohm
Lead Channel Impedance Value: 399 Ohm
Lead Channel Impedance Value: 456 Ohm
Lead Channel Pacing Threshold Amplitude: 0.625 V
Lead Channel Pacing Threshold Amplitude: 1.25 V
Lead Channel Pacing Threshold Pulse Width: 0.4 ms
Lead Channel Pacing Threshold Pulse Width: 0.4 ms
Lead Channel Sensing Intrinsic Amplitude: 5.375 mV
Lead Channel Sensing Intrinsic Amplitude: 5.375 mV
Lead Channel Sensing Intrinsic Amplitude: 9.75 mV
Lead Channel Sensing Intrinsic Amplitude: 9.75 mV
Lead Channel Setting Pacing Amplitude: 1.5 V
Lead Channel Setting Pacing Amplitude: 2.5 V
Lead Channel Setting Pacing Pulse Width: 1 ms
Lead Channel Setting Sensing Sensitivity: 2.8 mV

## 2020-09-17 NOTE — Telephone Encounter (Signed)
Son called and explained patient is still in rehab and may have to [facility]. States he will contact office when the decision is made regarding patient's return to his home.

## 2020-09-18 DIAGNOSIS — Z8673 Personal history of transient ischemic attack (TIA), and cerebral infarction without residual deficits: Secondary | ICD-10-CM | POA: Diagnosis not present

## 2020-09-18 DIAGNOSIS — R278 Other lack of coordination: Secondary | ICD-10-CM | POA: Diagnosis not present

## 2020-09-18 DIAGNOSIS — H353131 Nonexudative age-related macular degeneration, bilateral, early dry stage: Secondary | ICD-10-CM | POA: Diagnosis not present

## 2020-09-18 DIAGNOSIS — I5041 Acute combined systolic (congestive) and diastolic (congestive) heart failure: Secondary | ICD-10-CM | POA: Diagnosis not present

## 2020-09-18 DIAGNOSIS — I110 Hypertensive heart disease with heart failure: Secondary | ICD-10-CM | POA: Diagnosis not present

## 2020-09-18 DIAGNOSIS — R498 Other voice and resonance disorders: Secondary | ICD-10-CM | POA: Diagnosis not present

## 2020-09-18 DIAGNOSIS — R41841 Cognitive communication deficit: Secondary | ICD-10-CM | POA: Diagnosis not present

## 2020-09-18 DIAGNOSIS — M6258 Muscle wasting and atrophy, not elsewhere classified, other site: Secondary | ICD-10-CM | POA: Diagnosis not present

## 2020-09-18 DIAGNOSIS — M6281 Muscle weakness (generalized): Secondary | ICD-10-CM | POA: Diagnosis not present

## 2020-09-18 DIAGNOSIS — Z9181 History of falling: Secondary | ICD-10-CM | POA: Diagnosis not present

## 2020-09-18 DIAGNOSIS — U071 COVID-19: Secondary | ICD-10-CM | POA: Diagnosis not present

## 2020-09-18 DIAGNOSIS — R2681 Unsteadiness on feet: Secondary | ICD-10-CM | POA: Diagnosis not present

## 2020-09-18 DIAGNOSIS — R3911 Hesitancy of micturition: Secondary | ICD-10-CM | POA: Diagnosis not present

## 2020-09-18 DIAGNOSIS — R269 Unspecified abnormalities of gait and mobility: Secondary | ICD-10-CM | POA: Diagnosis not present

## 2020-09-18 DIAGNOSIS — R2689 Other abnormalities of gait and mobility: Secondary | ICD-10-CM | POA: Diagnosis not present

## 2020-09-19 DIAGNOSIS — M6281 Muscle weakness (generalized): Secondary | ICD-10-CM | POA: Diagnosis not present

## 2020-09-19 DIAGNOSIS — R269 Unspecified abnormalities of gait and mobility: Secondary | ICD-10-CM | POA: Diagnosis not present

## 2020-09-19 DIAGNOSIS — I10 Essential (primary) hypertension: Secondary | ICD-10-CM | POA: Diagnosis not present

## 2020-09-19 DIAGNOSIS — N390 Urinary tract infection, site not specified: Secondary | ICD-10-CM | POA: Diagnosis not present

## 2020-09-19 DIAGNOSIS — R41841 Cognitive communication deficit: Secondary | ICD-10-CM | POA: Diagnosis not present

## 2020-09-19 DIAGNOSIS — R278 Other lack of coordination: Secondary | ICD-10-CM | POA: Diagnosis not present

## 2020-09-19 DIAGNOSIS — R498 Other voice and resonance disorders: Secondary | ICD-10-CM | POA: Diagnosis not present

## 2020-09-19 DIAGNOSIS — Z9181 History of falling: Secondary | ICD-10-CM | POA: Diagnosis not present

## 2020-09-19 DIAGNOSIS — R2689 Other abnormalities of gait and mobility: Secondary | ICD-10-CM | POA: Diagnosis not present

## 2020-09-19 DIAGNOSIS — R488 Other symbolic dysfunctions: Secondary | ICD-10-CM | POA: Diagnosis not present

## 2020-09-19 DIAGNOSIS — I509 Heart failure, unspecified: Secondary | ICD-10-CM | POA: Diagnosis not present

## 2020-09-19 DIAGNOSIS — Z8673 Personal history of transient ischemic attack (TIA), and cerebral infarction without residual deficits: Secondary | ICD-10-CM | POA: Diagnosis not present

## 2020-09-19 DIAGNOSIS — R2681 Unsteadiness on feet: Secondary | ICD-10-CM | POA: Diagnosis not present

## 2020-09-19 DIAGNOSIS — M6258 Muscle wasting and atrophy, not elsewhere classified, other site: Secondary | ICD-10-CM | POA: Diagnosis not present

## 2020-09-22 DIAGNOSIS — I10 Essential (primary) hypertension: Secondary | ICD-10-CM | POA: Diagnosis not present

## 2020-09-22 DIAGNOSIS — Z8673 Personal history of transient ischemic attack (TIA), and cerebral infarction without residual deficits: Secondary | ICD-10-CM | POA: Diagnosis not present

## 2020-09-22 DIAGNOSIS — R41841 Cognitive communication deficit: Secondary | ICD-10-CM | POA: Diagnosis not present

## 2020-09-22 DIAGNOSIS — R2689 Other abnormalities of gait and mobility: Secondary | ICD-10-CM | POA: Diagnosis not present

## 2020-09-22 DIAGNOSIS — R278 Other lack of coordination: Secondary | ICD-10-CM | POA: Diagnosis not present

## 2020-09-22 DIAGNOSIS — I509 Heart failure, unspecified: Secondary | ICD-10-CM | POA: Diagnosis not present

## 2020-09-22 DIAGNOSIS — R498 Other voice and resonance disorders: Secondary | ICD-10-CM | POA: Diagnosis not present

## 2020-09-22 DIAGNOSIS — M6258 Muscle wasting and atrophy, not elsewhere classified, other site: Secondary | ICD-10-CM | POA: Diagnosis not present

## 2020-09-22 DIAGNOSIS — M6281 Muscle weakness (generalized): Secondary | ICD-10-CM | POA: Diagnosis not present

## 2020-09-22 DIAGNOSIS — R2681 Unsteadiness on feet: Secondary | ICD-10-CM | POA: Diagnosis not present

## 2020-09-22 DIAGNOSIS — R269 Unspecified abnormalities of gait and mobility: Secondary | ICD-10-CM | POA: Diagnosis not present

## 2020-09-22 DIAGNOSIS — Z9181 History of falling: Secondary | ICD-10-CM | POA: Diagnosis not present

## 2020-09-23 DIAGNOSIS — M6281 Muscle weakness (generalized): Secondary | ICD-10-CM | POA: Diagnosis not present

## 2020-09-23 DIAGNOSIS — R2689 Other abnormalities of gait and mobility: Secondary | ICD-10-CM | POA: Diagnosis not present

## 2020-09-23 DIAGNOSIS — R41841 Cognitive communication deficit: Secondary | ICD-10-CM | POA: Diagnosis not present

## 2020-09-23 DIAGNOSIS — W19XXXA Unspecified fall, initial encounter: Secondary | ICD-10-CM | POA: Diagnosis not present

## 2020-09-23 DIAGNOSIS — G250 Essential tremor: Secondary | ICD-10-CM | POA: Diagnosis not present

## 2020-09-23 DIAGNOSIS — R269 Unspecified abnormalities of gait and mobility: Secondary | ICD-10-CM | POA: Diagnosis not present

## 2020-09-23 DIAGNOSIS — Z8673 Personal history of transient ischemic attack (TIA), and cerebral infarction without residual deficits: Secondary | ICD-10-CM | POA: Diagnosis not present

## 2020-09-23 DIAGNOSIS — S51019A Laceration without foreign body of unspecified elbow, initial encounter: Secondary | ICD-10-CM | POA: Diagnosis not present

## 2020-09-23 DIAGNOSIS — Z9181 History of falling: Secondary | ICD-10-CM | POA: Diagnosis not present

## 2020-09-23 DIAGNOSIS — R498 Other voice and resonance disorders: Secondary | ICD-10-CM | POA: Diagnosis not present

## 2020-09-23 DIAGNOSIS — R278 Other lack of coordination: Secondary | ICD-10-CM | POA: Diagnosis not present

## 2020-09-23 DIAGNOSIS — R2681 Unsteadiness on feet: Secondary | ICD-10-CM | POA: Diagnosis not present

## 2020-09-23 DIAGNOSIS — M6258 Muscle wasting and atrophy, not elsewhere classified, other site: Secondary | ICD-10-CM | POA: Diagnosis not present

## 2020-09-24 DIAGNOSIS — Z9181 History of falling: Secondary | ICD-10-CM | POA: Diagnosis not present

## 2020-09-24 DIAGNOSIS — R2689 Other abnormalities of gait and mobility: Secondary | ICD-10-CM | POA: Diagnosis not present

## 2020-09-24 DIAGNOSIS — R269 Unspecified abnormalities of gait and mobility: Secondary | ICD-10-CM | POA: Diagnosis not present

## 2020-09-24 DIAGNOSIS — Z8673 Personal history of transient ischemic attack (TIA), and cerebral infarction without residual deficits: Secondary | ICD-10-CM | POA: Diagnosis not present

## 2020-09-24 DIAGNOSIS — R41841 Cognitive communication deficit: Secondary | ICD-10-CM | POA: Diagnosis not present

## 2020-09-24 DIAGNOSIS — R2681 Unsteadiness on feet: Secondary | ICD-10-CM | POA: Diagnosis not present

## 2020-09-24 DIAGNOSIS — R498 Other voice and resonance disorders: Secondary | ICD-10-CM | POA: Diagnosis not present

## 2020-09-24 DIAGNOSIS — M6258 Muscle wasting and atrophy, not elsewhere classified, other site: Secondary | ICD-10-CM | POA: Diagnosis not present

## 2020-09-24 DIAGNOSIS — R278 Other lack of coordination: Secondary | ICD-10-CM | POA: Diagnosis not present

## 2020-09-24 DIAGNOSIS — M6281 Muscle weakness (generalized): Secondary | ICD-10-CM | POA: Diagnosis not present

## 2020-09-25 DIAGNOSIS — R41841 Cognitive communication deficit: Secondary | ICD-10-CM | POA: Diagnosis not present

## 2020-09-25 DIAGNOSIS — R269 Unspecified abnormalities of gait and mobility: Secondary | ICD-10-CM | POA: Diagnosis not present

## 2020-09-25 DIAGNOSIS — Z9181 History of falling: Secondary | ICD-10-CM | POA: Diagnosis not present

## 2020-09-25 DIAGNOSIS — R278 Other lack of coordination: Secondary | ICD-10-CM | POA: Diagnosis not present

## 2020-09-25 DIAGNOSIS — M6281 Muscle weakness (generalized): Secondary | ICD-10-CM | POA: Diagnosis not present

## 2020-09-25 DIAGNOSIS — R2689 Other abnormalities of gait and mobility: Secondary | ICD-10-CM | POA: Diagnosis not present

## 2020-09-25 DIAGNOSIS — M6258 Muscle wasting and atrophy, not elsewhere classified, other site: Secondary | ICD-10-CM | POA: Diagnosis not present

## 2020-09-25 DIAGNOSIS — R2681 Unsteadiness on feet: Secondary | ICD-10-CM | POA: Diagnosis not present

## 2020-09-25 DIAGNOSIS — Z8673 Personal history of transient ischemic attack (TIA), and cerebral infarction without residual deficits: Secondary | ICD-10-CM | POA: Diagnosis not present

## 2020-09-25 DIAGNOSIS — R498 Other voice and resonance disorders: Secondary | ICD-10-CM | POA: Diagnosis not present

## 2020-09-26 DIAGNOSIS — R498 Other voice and resonance disorders: Secondary | ICD-10-CM | POA: Diagnosis not present

## 2020-09-26 DIAGNOSIS — I10 Essential (primary) hypertension: Secondary | ICD-10-CM | POA: Diagnosis not present

## 2020-09-26 DIAGNOSIS — R41841 Cognitive communication deficit: Secondary | ICD-10-CM | POA: Diagnosis not present

## 2020-09-26 DIAGNOSIS — R269 Unspecified abnormalities of gait and mobility: Secondary | ICD-10-CM | POA: Diagnosis not present

## 2020-09-26 DIAGNOSIS — R2681 Unsteadiness on feet: Secondary | ICD-10-CM | POA: Diagnosis not present

## 2020-09-26 DIAGNOSIS — R278 Other lack of coordination: Secondary | ICD-10-CM | POA: Diagnosis not present

## 2020-09-26 DIAGNOSIS — M6281 Muscle weakness (generalized): Secondary | ICD-10-CM | POA: Diagnosis not present

## 2020-09-26 DIAGNOSIS — R2689 Other abnormalities of gait and mobility: Secondary | ICD-10-CM | POA: Diagnosis not present

## 2020-09-26 DIAGNOSIS — Z8673 Personal history of transient ischemic attack (TIA), and cerebral infarction without residual deficits: Secondary | ICD-10-CM | POA: Diagnosis not present

## 2020-09-26 DIAGNOSIS — I509 Heart failure, unspecified: Secondary | ICD-10-CM | POA: Diagnosis not present

## 2020-09-26 DIAGNOSIS — M6258 Muscle wasting and atrophy, not elsewhere classified, other site: Secondary | ICD-10-CM | POA: Diagnosis not present

## 2020-09-26 DIAGNOSIS — Z9181 History of falling: Secondary | ICD-10-CM | POA: Diagnosis not present

## 2020-09-26 NOTE — Progress Notes (Signed)
Remote pacemaker transmission.   

## 2020-09-27 DIAGNOSIS — R2689 Other abnormalities of gait and mobility: Secondary | ICD-10-CM | POA: Diagnosis not present

## 2020-09-27 DIAGNOSIS — Z9181 History of falling: Secondary | ICD-10-CM | POA: Diagnosis not present

## 2020-09-27 DIAGNOSIS — R498 Other voice and resonance disorders: Secondary | ICD-10-CM | POA: Diagnosis not present

## 2020-09-27 DIAGNOSIS — R41841 Cognitive communication deficit: Secondary | ICD-10-CM | POA: Diagnosis not present

## 2020-09-27 DIAGNOSIS — R269 Unspecified abnormalities of gait and mobility: Secondary | ICD-10-CM | POA: Diagnosis not present

## 2020-09-27 DIAGNOSIS — Z8673 Personal history of transient ischemic attack (TIA), and cerebral infarction without residual deficits: Secondary | ICD-10-CM | POA: Diagnosis not present

## 2020-09-27 DIAGNOSIS — R278 Other lack of coordination: Secondary | ICD-10-CM | POA: Diagnosis not present

## 2020-09-27 DIAGNOSIS — M6258 Muscle wasting and atrophy, not elsewhere classified, other site: Secondary | ICD-10-CM | POA: Diagnosis not present

## 2020-09-27 DIAGNOSIS — M6281 Muscle weakness (generalized): Secondary | ICD-10-CM | POA: Diagnosis not present

## 2020-09-27 DIAGNOSIS — R2681 Unsteadiness on feet: Secondary | ICD-10-CM | POA: Diagnosis not present

## 2020-09-29 DIAGNOSIS — R278 Other lack of coordination: Secondary | ICD-10-CM | POA: Diagnosis not present

## 2020-09-29 DIAGNOSIS — Z9181 History of falling: Secondary | ICD-10-CM | POA: Diagnosis not present

## 2020-09-29 DIAGNOSIS — M6281 Muscle weakness (generalized): Secondary | ICD-10-CM | POA: Diagnosis not present

## 2020-09-29 DIAGNOSIS — R269 Unspecified abnormalities of gait and mobility: Secondary | ICD-10-CM | POA: Diagnosis not present

## 2020-09-29 DIAGNOSIS — R41841 Cognitive communication deficit: Secondary | ICD-10-CM | POA: Diagnosis not present

## 2020-09-29 DIAGNOSIS — M6258 Muscle wasting and atrophy, not elsewhere classified, other site: Secondary | ICD-10-CM | POA: Diagnosis not present

## 2020-09-29 DIAGNOSIS — R2689 Other abnormalities of gait and mobility: Secondary | ICD-10-CM | POA: Diagnosis not present

## 2020-09-29 DIAGNOSIS — R498 Other voice and resonance disorders: Secondary | ICD-10-CM | POA: Diagnosis not present

## 2020-09-29 DIAGNOSIS — Z8673 Personal history of transient ischemic attack (TIA), and cerebral infarction without residual deficits: Secondary | ICD-10-CM | POA: Diagnosis not present

## 2020-09-29 DIAGNOSIS — R2681 Unsteadiness on feet: Secondary | ICD-10-CM | POA: Diagnosis not present

## 2020-09-30 ENCOUNTER — Telehealth: Payer: Self-pay | Admitting: *Deleted

## 2020-09-30 NOTE — Telephone Encounter (Signed)
Returned call to patient regarding his request for a wheelchair. Patient states the nursing home asked the patient to use a wheelchair when out of hs room. He does use a walker in his room. The patient is unsure if and when he will discharged from the nursing home. He was advised to call back at his discharge to schedule an OV and be evaluated for his need for a wheelchair, per Dr Melford Aase.

## 2020-10-01 DIAGNOSIS — M6258 Muscle wasting and atrophy, not elsewhere classified, other site: Secondary | ICD-10-CM | POA: Diagnosis not present

## 2020-10-01 DIAGNOSIS — M6281 Muscle weakness (generalized): Secondary | ICD-10-CM | POA: Diagnosis not present

## 2020-10-01 DIAGNOSIS — R2689 Other abnormalities of gait and mobility: Secondary | ICD-10-CM | POA: Diagnosis not present

## 2020-10-01 DIAGNOSIS — R2681 Unsteadiness on feet: Secondary | ICD-10-CM | POA: Diagnosis not present

## 2020-10-01 DIAGNOSIS — R269 Unspecified abnormalities of gait and mobility: Secondary | ICD-10-CM | POA: Diagnosis not present

## 2020-10-01 DIAGNOSIS — R278 Other lack of coordination: Secondary | ICD-10-CM | POA: Diagnosis not present

## 2020-10-01 DIAGNOSIS — R498 Other voice and resonance disorders: Secondary | ICD-10-CM | POA: Diagnosis not present

## 2020-10-01 DIAGNOSIS — Z9181 History of falling: Secondary | ICD-10-CM | POA: Diagnosis not present

## 2020-10-01 DIAGNOSIS — Z8673 Personal history of transient ischemic attack (TIA), and cerebral infarction without residual deficits: Secondary | ICD-10-CM | POA: Diagnosis not present

## 2020-10-01 DIAGNOSIS — R41841 Cognitive communication deficit: Secondary | ICD-10-CM | POA: Diagnosis not present

## 2020-10-02 DIAGNOSIS — Z9181 History of falling: Secondary | ICD-10-CM | POA: Diagnosis not present

## 2020-10-02 DIAGNOSIS — R269 Unspecified abnormalities of gait and mobility: Secondary | ICD-10-CM | POA: Diagnosis not present

## 2020-10-02 DIAGNOSIS — R2689 Other abnormalities of gait and mobility: Secondary | ICD-10-CM | POA: Diagnosis not present

## 2020-10-02 DIAGNOSIS — R2681 Unsteadiness on feet: Secondary | ICD-10-CM | POA: Diagnosis not present

## 2020-10-02 DIAGNOSIS — M6281 Muscle weakness (generalized): Secondary | ICD-10-CM | POA: Diagnosis not present

## 2020-10-02 DIAGNOSIS — I10 Essential (primary) hypertension: Secondary | ICD-10-CM | POA: Diagnosis not present

## 2020-10-02 DIAGNOSIS — R41841 Cognitive communication deficit: Secondary | ICD-10-CM | POA: Diagnosis not present

## 2020-10-02 DIAGNOSIS — M6258 Muscle wasting and atrophy, not elsewhere classified, other site: Secondary | ICD-10-CM | POA: Diagnosis not present

## 2020-10-02 DIAGNOSIS — I509 Heart failure, unspecified: Secondary | ICD-10-CM | POA: Diagnosis not present

## 2020-10-02 DIAGNOSIS — R278 Other lack of coordination: Secondary | ICD-10-CM | POA: Diagnosis not present

## 2020-10-02 DIAGNOSIS — Z8673 Personal history of transient ischemic attack (TIA), and cerebral infarction without residual deficits: Secondary | ICD-10-CM | POA: Diagnosis not present

## 2020-10-02 DIAGNOSIS — R498 Other voice and resonance disorders: Secondary | ICD-10-CM | POA: Diagnosis not present

## 2020-10-03 DIAGNOSIS — M6281 Muscle weakness (generalized): Secondary | ICD-10-CM | POA: Diagnosis not present

## 2020-10-03 DIAGNOSIS — R41841 Cognitive communication deficit: Secondary | ICD-10-CM | POA: Diagnosis not present

## 2020-10-03 DIAGNOSIS — Z8673 Personal history of transient ischemic attack (TIA), and cerebral infarction without residual deficits: Secondary | ICD-10-CM | POA: Diagnosis not present

## 2020-10-03 DIAGNOSIS — R269 Unspecified abnormalities of gait and mobility: Secondary | ICD-10-CM | POA: Diagnosis not present

## 2020-10-03 DIAGNOSIS — R2689 Other abnormalities of gait and mobility: Secondary | ICD-10-CM | POA: Diagnosis not present

## 2020-10-03 DIAGNOSIS — R498 Other voice and resonance disorders: Secondary | ICD-10-CM | POA: Diagnosis not present

## 2020-10-03 DIAGNOSIS — R278 Other lack of coordination: Secondary | ICD-10-CM | POA: Diagnosis not present

## 2020-10-03 DIAGNOSIS — M6258 Muscle wasting and atrophy, not elsewhere classified, other site: Secondary | ICD-10-CM | POA: Diagnosis not present

## 2020-10-03 DIAGNOSIS — R2681 Unsteadiness on feet: Secondary | ICD-10-CM | POA: Diagnosis not present

## 2020-10-03 DIAGNOSIS — Z9181 History of falling: Secondary | ICD-10-CM | POA: Diagnosis not present

## 2020-10-04 DIAGNOSIS — M6258 Muscle wasting and atrophy, not elsewhere classified, other site: Secondary | ICD-10-CM | POA: Diagnosis not present

## 2020-10-04 DIAGNOSIS — R2689 Other abnormalities of gait and mobility: Secondary | ICD-10-CM | POA: Diagnosis not present

## 2020-10-04 DIAGNOSIS — R41841 Cognitive communication deficit: Secondary | ICD-10-CM | POA: Diagnosis not present

## 2020-10-04 DIAGNOSIS — M6281 Muscle weakness (generalized): Secondary | ICD-10-CM | POA: Diagnosis not present

## 2020-10-04 DIAGNOSIS — R2681 Unsteadiness on feet: Secondary | ICD-10-CM | POA: Diagnosis not present

## 2020-10-04 DIAGNOSIS — R269 Unspecified abnormalities of gait and mobility: Secondary | ICD-10-CM | POA: Diagnosis not present

## 2020-10-04 DIAGNOSIS — R498 Other voice and resonance disorders: Secondary | ICD-10-CM | POA: Diagnosis not present

## 2020-10-04 DIAGNOSIS — Z9181 History of falling: Secondary | ICD-10-CM | POA: Diagnosis not present

## 2020-10-04 DIAGNOSIS — Z8673 Personal history of transient ischemic attack (TIA), and cerebral infarction without residual deficits: Secondary | ICD-10-CM | POA: Diagnosis not present

## 2020-10-04 DIAGNOSIS — R278 Other lack of coordination: Secondary | ICD-10-CM | POA: Diagnosis not present

## 2020-10-06 DIAGNOSIS — M6258 Muscle wasting and atrophy, not elsewhere classified, other site: Secondary | ICD-10-CM | POA: Diagnosis not present

## 2020-10-06 DIAGNOSIS — M6281 Muscle weakness (generalized): Secondary | ICD-10-CM | POA: Diagnosis not present

## 2020-10-06 DIAGNOSIS — R278 Other lack of coordination: Secondary | ICD-10-CM | POA: Diagnosis not present

## 2020-10-06 DIAGNOSIS — R2681 Unsteadiness on feet: Secondary | ICD-10-CM | POA: Diagnosis not present

## 2020-10-06 DIAGNOSIS — R269 Unspecified abnormalities of gait and mobility: Secondary | ICD-10-CM | POA: Diagnosis not present

## 2020-10-06 DIAGNOSIS — Z8673 Personal history of transient ischemic attack (TIA), and cerebral infarction without residual deficits: Secondary | ICD-10-CM | POA: Diagnosis not present

## 2020-10-06 DIAGNOSIS — R2689 Other abnormalities of gait and mobility: Secondary | ICD-10-CM | POA: Diagnosis not present

## 2020-10-06 DIAGNOSIS — R498 Other voice and resonance disorders: Secondary | ICD-10-CM | POA: Diagnosis not present

## 2020-10-06 DIAGNOSIS — R41841 Cognitive communication deficit: Secondary | ICD-10-CM | POA: Diagnosis not present

## 2020-10-06 DIAGNOSIS — Z9181 History of falling: Secondary | ICD-10-CM | POA: Diagnosis not present

## 2020-10-07 DIAGNOSIS — R2681 Unsteadiness on feet: Secondary | ICD-10-CM | POA: Diagnosis not present

## 2020-10-07 DIAGNOSIS — M6258 Muscle wasting and atrophy, not elsewhere classified, other site: Secondary | ICD-10-CM | POA: Diagnosis not present

## 2020-10-07 DIAGNOSIS — R498 Other voice and resonance disorders: Secondary | ICD-10-CM | POA: Diagnosis not present

## 2020-10-07 DIAGNOSIS — R41841 Cognitive communication deficit: Secondary | ICD-10-CM | POA: Diagnosis not present

## 2020-10-07 DIAGNOSIS — R2689 Other abnormalities of gait and mobility: Secondary | ICD-10-CM | POA: Diagnosis not present

## 2020-10-07 DIAGNOSIS — Z9181 History of falling: Secondary | ICD-10-CM | POA: Diagnosis not present

## 2020-10-07 DIAGNOSIS — R278 Other lack of coordination: Secondary | ICD-10-CM | POA: Diagnosis not present

## 2020-10-07 DIAGNOSIS — M6281 Muscle weakness (generalized): Secondary | ICD-10-CM | POA: Diagnosis not present

## 2020-10-07 DIAGNOSIS — R269 Unspecified abnormalities of gait and mobility: Secondary | ICD-10-CM | POA: Diagnosis not present

## 2020-10-07 DIAGNOSIS — Z8673 Personal history of transient ischemic attack (TIA), and cerebral infarction without residual deficits: Secondary | ICD-10-CM | POA: Diagnosis not present

## 2020-10-08 DIAGNOSIS — R278 Other lack of coordination: Secondary | ICD-10-CM | POA: Diagnosis not present

## 2020-10-08 DIAGNOSIS — Z9181 History of falling: Secondary | ICD-10-CM | POA: Diagnosis not present

## 2020-10-08 DIAGNOSIS — R2689 Other abnormalities of gait and mobility: Secondary | ICD-10-CM | POA: Diagnosis not present

## 2020-10-08 DIAGNOSIS — R41841 Cognitive communication deficit: Secondary | ICD-10-CM | POA: Diagnosis not present

## 2020-10-08 DIAGNOSIS — R498 Other voice and resonance disorders: Secondary | ICD-10-CM | POA: Diagnosis not present

## 2020-10-08 DIAGNOSIS — M6281 Muscle weakness (generalized): Secondary | ICD-10-CM | POA: Diagnosis not present

## 2020-10-08 DIAGNOSIS — M6258 Muscle wasting and atrophy, not elsewhere classified, other site: Secondary | ICD-10-CM | POA: Diagnosis not present

## 2020-10-08 DIAGNOSIS — Z8673 Personal history of transient ischemic attack (TIA), and cerebral infarction without residual deficits: Secondary | ICD-10-CM | POA: Diagnosis not present

## 2020-10-08 DIAGNOSIS — R2681 Unsteadiness on feet: Secondary | ICD-10-CM | POA: Diagnosis not present

## 2020-10-08 DIAGNOSIS — R269 Unspecified abnormalities of gait and mobility: Secondary | ICD-10-CM | POA: Diagnosis not present

## 2020-10-09 DIAGNOSIS — R269 Unspecified abnormalities of gait and mobility: Secondary | ICD-10-CM | POA: Diagnosis not present

## 2020-10-09 DIAGNOSIS — R498 Other voice and resonance disorders: Secondary | ICD-10-CM | POA: Diagnosis not present

## 2020-10-09 DIAGNOSIS — R41841 Cognitive communication deficit: Secondary | ICD-10-CM | POA: Diagnosis not present

## 2020-10-09 DIAGNOSIS — R2689 Other abnormalities of gait and mobility: Secondary | ICD-10-CM | POA: Diagnosis not present

## 2020-10-09 DIAGNOSIS — R278 Other lack of coordination: Secondary | ICD-10-CM | POA: Diagnosis not present

## 2020-10-09 DIAGNOSIS — Z9181 History of falling: Secondary | ICD-10-CM | POA: Diagnosis not present

## 2020-10-09 DIAGNOSIS — M6281 Muscle weakness (generalized): Secondary | ICD-10-CM | POA: Diagnosis not present

## 2020-10-09 DIAGNOSIS — M6258 Muscle wasting and atrophy, not elsewhere classified, other site: Secondary | ICD-10-CM | POA: Diagnosis not present

## 2020-10-09 DIAGNOSIS — R2681 Unsteadiness on feet: Secondary | ICD-10-CM | POA: Diagnosis not present

## 2020-10-09 DIAGNOSIS — Z8673 Personal history of transient ischemic attack (TIA), and cerebral infarction without residual deficits: Secondary | ICD-10-CM | POA: Diagnosis not present

## 2020-10-10 DIAGNOSIS — M6281 Muscle weakness (generalized): Secondary | ICD-10-CM | POA: Diagnosis not present

## 2020-10-10 DIAGNOSIS — S51019A Laceration without foreign body of unspecified elbow, initial encounter: Secondary | ICD-10-CM | POA: Diagnosis not present

## 2020-10-10 DIAGNOSIS — R278 Other lack of coordination: Secondary | ICD-10-CM | POA: Diagnosis not present

## 2020-10-10 DIAGNOSIS — R498 Other voice and resonance disorders: Secondary | ICD-10-CM | POA: Diagnosis not present

## 2020-10-10 DIAGNOSIS — G250 Essential tremor: Secondary | ICD-10-CM | POA: Diagnosis not present

## 2020-10-10 DIAGNOSIS — R2689 Other abnormalities of gait and mobility: Secondary | ICD-10-CM | POA: Diagnosis not present

## 2020-10-10 DIAGNOSIS — I480 Paroxysmal atrial fibrillation: Secondary | ICD-10-CM | POA: Diagnosis not present

## 2020-10-10 DIAGNOSIS — M6258 Muscle wasting and atrophy, not elsewhere classified, other site: Secondary | ICD-10-CM | POA: Diagnosis not present

## 2020-10-10 DIAGNOSIS — Z9181 History of falling: Secondary | ICD-10-CM | POA: Diagnosis not present

## 2020-10-10 DIAGNOSIS — R269 Unspecified abnormalities of gait and mobility: Secondary | ICD-10-CM | POA: Diagnosis not present

## 2020-10-10 DIAGNOSIS — Z8673 Personal history of transient ischemic attack (TIA), and cerebral infarction without residual deficits: Secondary | ICD-10-CM | POA: Diagnosis not present

## 2020-10-10 DIAGNOSIS — R2681 Unsteadiness on feet: Secondary | ICD-10-CM | POA: Diagnosis not present

## 2020-10-10 DIAGNOSIS — R41841 Cognitive communication deficit: Secondary | ICD-10-CM | POA: Diagnosis not present

## 2020-10-11 DIAGNOSIS — R2681 Unsteadiness on feet: Secondary | ICD-10-CM | POA: Diagnosis not present

## 2020-10-11 DIAGNOSIS — R2689 Other abnormalities of gait and mobility: Secondary | ICD-10-CM | POA: Diagnosis not present

## 2020-10-11 DIAGNOSIS — R498 Other voice and resonance disorders: Secondary | ICD-10-CM | POA: Diagnosis not present

## 2020-10-11 DIAGNOSIS — R41841 Cognitive communication deficit: Secondary | ICD-10-CM | POA: Diagnosis not present

## 2020-10-11 DIAGNOSIS — R278 Other lack of coordination: Secondary | ICD-10-CM | POA: Diagnosis not present

## 2020-10-11 DIAGNOSIS — Z9181 History of falling: Secondary | ICD-10-CM | POA: Diagnosis not present

## 2020-10-11 DIAGNOSIS — Z8673 Personal history of transient ischemic attack (TIA), and cerebral infarction without residual deficits: Secondary | ICD-10-CM | POA: Diagnosis not present

## 2020-10-11 DIAGNOSIS — R269 Unspecified abnormalities of gait and mobility: Secondary | ICD-10-CM | POA: Diagnosis not present

## 2020-10-11 DIAGNOSIS — M6281 Muscle weakness (generalized): Secondary | ICD-10-CM | POA: Diagnosis not present

## 2020-10-11 DIAGNOSIS — M6258 Muscle wasting and atrophy, not elsewhere classified, other site: Secondary | ICD-10-CM | POA: Diagnosis not present

## 2020-10-13 ENCOUNTER — Encounter: Payer: Medicare Other | Admitting: Cardiovascular Disease

## 2020-10-13 DIAGNOSIS — R2689 Other abnormalities of gait and mobility: Secondary | ICD-10-CM | POA: Diagnosis not present

## 2020-10-13 DIAGNOSIS — R269 Unspecified abnormalities of gait and mobility: Secondary | ICD-10-CM | POA: Diagnosis not present

## 2020-10-13 DIAGNOSIS — Z8673 Personal history of transient ischemic attack (TIA), and cerebral infarction without residual deficits: Secondary | ICD-10-CM | POA: Diagnosis not present

## 2020-10-13 DIAGNOSIS — R2681 Unsteadiness on feet: Secondary | ICD-10-CM | POA: Diagnosis not present

## 2020-10-13 DIAGNOSIS — M6258 Muscle wasting and atrophy, not elsewhere classified, other site: Secondary | ICD-10-CM | POA: Diagnosis not present

## 2020-10-13 DIAGNOSIS — R498 Other voice and resonance disorders: Secondary | ICD-10-CM | POA: Diagnosis not present

## 2020-10-13 DIAGNOSIS — R41841 Cognitive communication deficit: Secondary | ICD-10-CM | POA: Diagnosis not present

## 2020-10-13 DIAGNOSIS — M6281 Muscle weakness (generalized): Secondary | ICD-10-CM | POA: Diagnosis not present

## 2020-10-13 DIAGNOSIS — R278 Other lack of coordination: Secondary | ICD-10-CM | POA: Diagnosis not present

## 2020-10-13 DIAGNOSIS — Z9181 History of falling: Secondary | ICD-10-CM | POA: Diagnosis not present

## 2020-10-15 DIAGNOSIS — R498 Other voice and resonance disorders: Secondary | ICD-10-CM | POA: Diagnosis not present

## 2020-10-15 DIAGNOSIS — M6281 Muscle weakness (generalized): Secondary | ICD-10-CM | POA: Diagnosis not present

## 2020-10-15 DIAGNOSIS — M6258 Muscle wasting and atrophy, not elsewhere classified, other site: Secondary | ICD-10-CM | POA: Diagnosis not present

## 2020-10-15 DIAGNOSIS — R2681 Unsteadiness on feet: Secondary | ICD-10-CM | POA: Diagnosis not present

## 2020-10-15 DIAGNOSIS — Z8673 Personal history of transient ischemic attack (TIA), and cerebral infarction without residual deficits: Secondary | ICD-10-CM | POA: Diagnosis not present

## 2020-10-15 DIAGNOSIS — R269 Unspecified abnormalities of gait and mobility: Secondary | ICD-10-CM | POA: Diagnosis not present

## 2020-10-15 DIAGNOSIS — Z9181 History of falling: Secondary | ICD-10-CM | POA: Diagnosis not present

## 2020-10-15 DIAGNOSIS — R41841 Cognitive communication deficit: Secondary | ICD-10-CM | POA: Diagnosis not present

## 2020-10-15 DIAGNOSIS — R278 Other lack of coordination: Secondary | ICD-10-CM | POA: Diagnosis not present

## 2020-10-15 DIAGNOSIS — R2689 Other abnormalities of gait and mobility: Secondary | ICD-10-CM | POA: Diagnosis not present

## 2020-10-16 DIAGNOSIS — R41841 Cognitive communication deficit: Secondary | ICD-10-CM | POA: Diagnosis not present

## 2020-10-16 DIAGNOSIS — N390 Urinary tract infection, site not specified: Secondary | ICD-10-CM | POA: Diagnosis not present

## 2020-10-16 DIAGNOSIS — R278 Other lack of coordination: Secondary | ICD-10-CM | POA: Diagnosis not present

## 2020-10-16 DIAGNOSIS — R2689 Other abnormalities of gait and mobility: Secondary | ICD-10-CM | POA: Diagnosis not present

## 2020-10-16 DIAGNOSIS — I2510 Atherosclerotic heart disease of native coronary artery without angina pectoris: Secondary | ICD-10-CM | POA: Diagnosis not present

## 2020-10-16 DIAGNOSIS — R2681 Unsteadiness on feet: Secondary | ICD-10-CM | POA: Diagnosis not present

## 2020-10-16 DIAGNOSIS — Z8673 Personal history of transient ischemic attack (TIA), and cerebral infarction without residual deficits: Secondary | ICD-10-CM | POA: Diagnosis not present

## 2020-10-16 DIAGNOSIS — B952 Enterococcus as the cause of diseases classified elsewhere: Secondary | ICD-10-CM | POA: Diagnosis not present

## 2020-10-16 DIAGNOSIS — B9689 Other specified bacterial agents as the cause of diseases classified elsewhere: Secondary | ICD-10-CM | POA: Diagnosis not present

## 2020-10-16 DIAGNOSIS — M6258 Muscle wasting and atrophy, not elsewhere classified, other site: Secondary | ICD-10-CM | POA: Diagnosis not present

## 2020-10-16 DIAGNOSIS — M6281 Muscle weakness (generalized): Secondary | ICD-10-CM | POA: Diagnosis not present

## 2020-10-16 DIAGNOSIS — R498 Other voice and resonance disorders: Secondary | ICD-10-CM | POA: Diagnosis not present

## 2020-10-16 DIAGNOSIS — R269 Unspecified abnormalities of gait and mobility: Secondary | ICD-10-CM | POA: Diagnosis not present

## 2020-10-16 DIAGNOSIS — Z9181 History of falling: Secondary | ICD-10-CM | POA: Diagnosis not present

## 2020-10-17 DIAGNOSIS — R41841 Cognitive communication deficit: Secondary | ICD-10-CM | POA: Diagnosis not present

## 2020-10-17 DIAGNOSIS — R498 Other voice and resonance disorders: Secondary | ICD-10-CM | POA: Diagnosis not present

## 2020-10-17 DIAGNOSIS — M6281 Muscle weakness (generalized): Secondary | ICD-10-CM | POA: Diagnosis not present

## 2020-10-17 DIAGNOSIS — M6258 Muscle wasting and atrophy, not elsewhere classified, other site: Secondary | ICD-10-CM | POA: Diagnosis not present

## 2020-10-17 DIAGNOSIS — Z9181 History of falling: Secondary | ICD-10-CM | POA: Diagnosis not present

## 2020-10-17 DIAGNOSIS — Z8673 Personal history of transient ischemic attack (TIA), and cerebral infarction without residual deficits: Secondary | ICD-10-CM | POA: Diagnosis not present

## 2020-10-17 DIAGNOSIS — R2689 Other abnormalities of gait and mobility: Secondary | ICD-10-CM | POA: Diagnosis not present

## 2020-10-17 DIAGNOSIS — R278 Other lack of coordination: Secondary | ICD-10-CM | POA: Diagnosis not present

## 2020-10-17 DIAGNOSIS — R2681 Unsteadiness on feet: Secondary | ICD-10-CM | POA: Diagnosis not present

## 2020-10-17 DIAGNOSIS — R269 Unspecified abnormalities of gait and mobility: Secondary | ICD-10-CM | POA: Diagnosis not present

## 2020-10-18 DIAGNOSIS — R2681 Unsteadiness on feet: Secondary | ICD-10-CM | POA: Diagnosis not present

## 2020-10-18 DIAGNOSIS — Z9181 History of falling: Secondary | ICD-10-CM | POA: Diagnosis not present

## 2020-10-18 DIAGNOSIS — R278 Other lack of coordination: Secondary | ICD-10-CM | POA: Diagnosis not present

## 2020-10-18 DIAGNOSIS — R2689 Other abnormalities of gait and mobility: Secondary | ICD-10-CM | POA: Diagnosis not present

## 2020-10-18 DIAGNOSIS — R269 Unspecified abnormalities of gait and mobility: Secondary | ICD-10-CM | POA: Diagnosis not present

## 2020-10-18 DIAGNOSIS — M6258 Muscle wasting and atrophy, not elsewhere classified, other site: Secondary | ICD-10-CM | POA: Diagnosis not present

## 2020-10-18 DIAGNOSIS — M6281 Muscle weakness (generalized): Secondary | ICD-10-CM | POA: Diagnosis not present

## 2020-10-18 DIAGNOSIS — R498 Other voice and resonance disorders: Secondary | ICD-10-CM | POA: Diagnosis not present

## 2020-10-18 DIAGNOSIS — R41841 Cognitive communication deficit: Secondary | ICD-10-CM | POA: Diagnosis not present

## 2020-10-18 DIAGNOSIS — Z8673 Personal history of transient ischemic attack (TIA), and cerebral infarction without residual deficits: Secondary | ICD-10-CM | POA: Diagnosis not present

## 2020-10-20 DIAGNOSIS — M6281 Muscle weakness (generalized): Secondary | ICD-10-CM | POA: Diagnosis not present

## 2020-10-20 DIAGNOSIS — R2689 Other abnormalities of gait and mobility: Secondary | ICD-10-CM | POA: Diagnosis not present

## 2020-10-20 DIAGNOSIS — R278 Other lack of coordination: Secondary | ICD-10-CM | POA: Diagnosis not present

## 2020-10-20 DIAGNOSIS — R41841 Cognitive communication deficit: Secondary | ICD-10-CM | POA: Diagnosis not present

## 2020-10-20 DIAGNOSIS — M6258 Muscle wasting and atrophy, not elsewhere classified, other site: Secondary | ICD-10-CM | POA: Diagnosis not present

## 2020-10-20 DIAGNOSIS — R269 Unspecified abnormalities of gait and mobility: Secondary | ICD-10-CM | POA: Diagnosis not present

## 2020-10-20 DIAGNOSIS — Z8673 Personal history of transient ischemic attack (TIA), and cerebral infarction without residual deficits: Secondary | ICD-10-CM | POA: Diagnosis not present

## 2020-10-20 DIAGNOSIS — R498 Other voice and resonance disorders: Secondary | ICD-10-CM | POA: Diagnosis not present

## 2020-10-20 DIAGNOSIS — R2681 Unsteadiness on feet: Secondary | ICD-10-CM | POA: Diagnosis not present

## 2020-10-20 DIAGNOSIS — Z9181 History of falling: Secondary | ICD-10-CM | POA: Diagnosis not present

## 2020-10-21 DIAGNOSIS — M6258 Muscle wasting and atrophy, not elsewhere classified, other site: Secondary | ICD-10-CM | POA: Diagnosis not present

## 2020-10-21 DIAGNOSIS — M6281 Muscle weakness (generalized): Secondary | ICD-10-CM | POA: Diagnosis not present

## 2020-10-21 DIAGNOSIS — Z8673 Personal history of transient ischemic attack (TIA), and cerebral infarction without residual deficits: Secondary | ICD-10-CM | POA: Diagnosis not present

## 2020-10-21 DIAGNOSIS — R41841 Cognitive communication deficit: Secondary | ICD-10-CM | POA: Diagnosis not present

## 2020-10-21 DIAGNOSIS — Z9181 History of falling: Secondary | ICD-10-CM | POA: Diagnosis not present

## 2020-10-21 DIAGNOSIS — R498 Other voice and resonance disorders: Secondary | ICD-10-CM | POA: Diagnosis not present

## 2020-10-21 DIAGNOSIS — R2689 Other abnormalities of gait and mobility: Secondary | ICD-10-CM | POA: Diagnosis not present

## 2020-10-21 DIAGNOSIS — R2681 Unsteadiness on feet: Secondary | ICD-10-CM | POA: Diagnosis not present

## 2020-10-21 DIAGNOSIS — R269 Unspecified abnormalities of gait and mobility: Secondary | ICD-10-CM | POA: Diagnosis not present

## 2020-10-21 DIAGNOSIS — R278 Other lack of coordination: Secondary | ICD-10-CM | POA: Diagnosis not present

## 2020-10-22 DIAGNOSIS — R269 Unspecified abnormalities of gait and mobility: Secondary | ICD-10-CM | POA: Diagnosis not present

## 2020-10-22 DIAGNOSIS — R278 Other lack of coordination: Secondary | ICD-10-CM | POA: Diagnosis not present

## 2020-10-22 DIAGNOSIS — R2681 Unsteadiness on feet: Secondary | ICD-10-CM | POA: Diagnosis not present

## 2020-10-22 DIAGNOSIS — R41841 Cognitive communication deficit: Secondary | ICD-10-CM | POA: Diagnosis not present

## 2020-10-22 DIAGNOSIS — R498 Other voice and resonance disorders: Secondary | ICD-10-CM | POA: Diagnosis not present

## 2020-10-22 DIAGNOSIS — Z8673 Personal history of transient ischemic attack (TIA), and cerebral infarction without residual deficits: Secondary | ICD-10-CM | POA: Diagnosis not present

## 2020-10-22 DIAGNOSIS — M6281 Muscle weakness (generalized): Secondary | ICD-10-CM | POA: Diagnosis not present

## 2020-10-22 DIAGNOSIS — Z9181 History of falling: Secondary | ICD-10-CM | POA: Diagnosis not present

## 2020-10-22 DIAGNOSIS — M6258 Muscle wasting and atrophy, not elsewhere classified, other site: Secondary | ICD-10-CM | POA: Diagnosis not present

## 2020-10-22 DIAGNOSIS — R2689 Other abnormalities of gait and mobility: Secondary | ICD-10-CM | POA: Diagnosis not present

## 2020-10-23 DIAGNOSIS — R269 Unspecified abnormalities of gait and mobility: Secondary | ICD-10-CM | POA: Diagnosis not present

## 2020-10-23 DIAGNOSIS — R498 Other voice and resonance disorders: Secondary | ICD-10-CM | POA: Diagnosis not present

## 2020-10-23 DIAGNOSIS — R2689 Other abnormalities of gait and mobility: Secondary | ICD-10-CM | POA: Diagnosis not present

## 2020-10-23 DIAGNOSIS — R41841 Cognitive communication deficit: Secondary | ICD-10-CM | POA: Diagnosis not present

## 2020-10-23 DIAGNOSIS — M6258 Muscle wasting and atrophy, not elsewhere classified, other site: Secondary | ICD-10-CM | POA: Diagnosis not present

## 2020-10-23 DIAGNOSIS — Z8673 Personal history of transient ischemic attack (TIA), and cerebral infarction without residual deficits: Secondary | ICD-10-CM | POA: Diagnosis not present

## 2020-10-23 DIAGNOSIS — R278 Other lack of coordination: Secondary | ICD-10-CM | POA: Diagnosis not present

## 2020-10-23 DIAGNOSIS — M6281 Muscle weakness (generalized): Secondary | ICD-10-CM | POA: Diagnosis not present

## 2020-10-23 DIAGNOSIS — Z9181 History of falling: Secondary | ICD-10-CM | POA: Diagnosis not present

## 2020-10-23 DIAGNOSIS — R2681 Unsteadiness on feet: Secondary | ICD-10-CM | POA: Diagnosis not present

## 2020-10-24 DIAGNOSIS — R296 Repeated falls: Secondary | ICD-10-CM | POA: Diagnosis not present

## 2020-10-24 DIAGNOSIS — Z9181 History of falling: Secondary | ICD-10-CM | POA: Diagnosis not present

## 2020-10-24 DIAGNOSIS — I110 Hypertensive heart disease with heart failure: Secondary | ICD-10-CM | POA: Diagnosis not present

## 2020-10-24 DIAGNOSIS — K5909 Other constipation: Secondary | ICD-10-CM | POA: Diagnosis not present

## 2020-10-24 DIAGNOSIS — R2681 Unsteadiness on feet: Secondary | ICD-10-CM | POA: Diagnosis not present

## 2020-10-24 DIAGNOSIS — R278 Other lack of coordination: Secondary | ICD-10-CM | POA: Diagnosis not present

## 2020-10-24 DIAGNOSIS — M6258 Muscle wasting and atrophy, not elsewhere classified, other site: Secondary | ICD-10-CM | POA: Diagnosis not present

## 2020-10-24 DIAGNOSIS — R2689 Other abnormalities of gait and mobility: Secondary | ICD-10-CM | POA: Diagnosis not present

## 2020-10-24 DIAGNOSIS — Z8673 Personal history of transient ischemic attack (TIA), and cerebral infarction without residual deficits: Secondary | ICD-10-CM | POA: Diagnosis not present

## 2020-10-24 DIAGNOSIS — M6281 Muscle weakness (generalized): Secondary | ICD-10-CM | POA: Diagnosis not present

## 2020-10-24 DIAGNOSIS — R269 Unspecified abnormalities of gait and mobility: Secondary | ICD-10-CM | POA: Diagnosis not present

## 2020-10-24 DIAGNOSIS — R41841 Cognitive communication deficit: Secondary | ICD-10-CM | POA: Diagnosis not present

## 2020-10-24 DIAGNOSIS — R498 Other voice and resonance disorders: Secondary | ICD-10-CM | POA: Diagnosis not present

## 2020-10-24 DIAGNOSIS — M5430 Sciatica, unspecified side: Secondary | ICD-10-CM | POA: Diagnosis not present

## 2020-10-25 DIAGNOSIS — R498 Other voice and resonance disorders: Secondary | ICD-10-CM | POA: Diagnosis not present

## 2020-10-25 DIAGNOSIS — M6281 Muscle weakness (generalized): Secondary | ICD-10-CM | POA: Diagnosis not present

## 2020-10-25 DIAGNOSIS — R269 Unspecified abnormalities of gait and mobility: Secondary | ICD-10-CM | POA: Diagnosis not present

## 2020-10-25 DIAGNOSIS — R41841 Cognitive communication deficit: Secondary | ICD-10-CM | POA: Diagnosis not present

## 2020-10-25 DIAGNOSIS — Z9181 History of falling: Secondary | ICD-10-CM | POA: Diagnosis not present

## 2020-10-25 DIAGNOSIS — R278 Other lack of coordination: Secondary | ICD-10-CM | POA: Diagnosis not present

## 2020-10-25 DIAGNOSIS — R2689 Other abnormalities of gait and mobility: Secondary | ICD-10-CM | POA: Diagnosis not present

## 2020-10-25 DIAGNOSIS — M6258 Muscle wasting and atrophy, not elsewhere classified, other site: Secondary | ICD-10-CM | POA: Diagnosis not present

## 2020-10-25 DIAGNOSIS — R2681 Unsteadiness on feet: Secondary | ICD-10-CM | POA: Diagnosis not present

## 2020-10-25 DIAGNOSIS — Z8673 Personal history of transient ischemic attack (TIA), and cerebral infarction without residual deficits: Secondary | ICD-10-CM | POA: Diagnosis not present

## 2020-10-27 DIAGNOSIS — R278 Other lack of coordination: Secondary | ICD-10-CM | POA: Diagnosis not present

## 2020-10-27 DIAGNOSIS — R498 Other voice and resonance disorders: Secondary | ICD-10-CM | POA: Diagnosis not present

## 2020-10-27 DIAGNOSIS — R2681 Unsteadiness on feet: Secondary | ICD-10-CM | POA: Diagnosis not present

## 2020-10-27 DIAGNOSIS — Z8673 Personal history of transient ischemic attack (TIA), and cerebral infarction without residual deficits: Secondary | ICD-10-CM | POA: Diagnosis not present

## 2020-10-27 DIAGNOSIS — R269 Unspecified abnormalities of gait and mobility: Secondary | ICD-10-CM | POA: Diagnosis not present

## 2020-10-27 DIAGNOSIS — R41841 Cognitive communication deficit: Secondary | ICD-10-CM | POA: Diagnosis not present

## 2020-10-27 DIAGNOSIS — R2689 Other abnormalities of gait and mobility: Secondary | ICD-10-CM | POA: Diagnosis not present

## 2020-10-27 DIAGNOSIS — M6258 Muscle wasting and atrophy, not elsewhere classified, other site: Secondary | ICD-10-CM | POA: Diagnosis not present

## 2020-10-27 DIAGNOSIS — M6281 Muscle weakness (generalized): Secondary | ICD-10-CM | POA: Diagnosis not present

## 2020-10-27 DIAGNOSIS — Z9181 History of falling: Secondary | ICD-10-CM | POA: Diagnosis not present

## 2020-10-28 DIAGNOSIS — R41841 Cognitive communication deficit: Secondary | ICD-10-CM | POA: Diagnosis not present

## 2020-10-28 DIAGNOSIS — Z8673 Personal history of transient ischemic attack (TIA), and cerebral infarction without residual deficits: Secondary | ICD-10-CM | POA: Diagnosis not present

## 2020-10-28 DIAGNOSIS — M6258 Muscle wasting and atrophy, not elsewhere classified, other site: Secondary | ICD-10-CM | POA: Diagnosis not present

## 2020-10-28 DIAGNOSIS — R269 Unspecified abnormalities of gait and mobility: Secondary | ICD-10-CM | POA: Diagnosis not present

## 2020-10-28 DIAGNOSIS — Z9181 History of falling: Secondary | ICD-10-CM | POA: Diagnosis not present

## 2020-10-28 DIAGNOSIS — R498 Other voice and resonance disorders: Secondary | ICD-10-CM | POA: Diagnosis not present

## 2020-10-28 DIAGNOSIS — R278 Other lack of coordination: Secondary | ICD-10-CM | POA: Diagnosis not present

## 2020-10-28 DIAGNOSIS — R2689 Other abnormalities of gait and mobility: Secondary | ICD-10-CM | POA: Diagnosis not present

## 2020-10-28 DIAGNOSIS — M6281 Muscle weakness (generalized): Secondary | ICD-10-CM | POA: Diagnosis not present

## 2020-10-28 DIAGNOSIS — R2681 Unsteadiness on feet: Secondary | ICD-10-CM | POA: Diagnosis not present

## 2020-10-29 ENCOUNTER — Emergency Department (HOSPITAL_COMMUNITY): Payer: Medicare Other

## 2020-10-29 ENCOUNTER — Encounter (HOSPITAL_COMMUNITY): Payer: Self-pay

## 2020-10-29 ENCOUNTER — Other Ambulatory Visit: Payer: Self-pay

## 2020-10-29 ENCOUNTER — Emergency Department (HOSPITAL_COMMUNITY)
Admission: EM | Admit: 2020-10-29 | Discharge: 2020-10-29 | Disposition: A | Discharge status: Home / Self Care | Payer: Medicare Other | Attending: Emergency Medicine | Admitting: Emergency Medicine

## 2020-10-29 DIAGNOSIS — M6281 Muscle weakness (generalized): Secondary | ICD-10-CM | POA: Diagnosis not present

## 2020-10-29 DIAGNOSIS — S0990XA Unspecified injury of head, initial encounter: Secondary | ICD-10-CM

## 2020-10-29 DIAGNOSIS — I6529 Occlusion and stenosis of unspecified carotid artery: Secondary | ICD-10-CM | POA: Diagnosis not present

## 2020-10-29 DIAGNOSIS — R498 Other voice and resonance disorders: Secondary | ICD-10-CM | POA: Diagnosis not present

## 2020-10-29 DIAGNOSIS — R278 Other lack of coordination: Secondary | ICD-10-CM | POA: Diagnosis not present

## 2020-10-29 DIAGNOSIS — I639 Cerebral infarction, unspecified: Secondary | ICD-10-CM | POA: Diagnosis not present

## 2020-10-29 DIAGNOSIS — S0083XA Contusion of other part of head, initial encounter: Secondary | ICD-10-CM | POA: Insufficient documentation

## 2020-10-29 DIAGNOSIS — R41841 Cognitive communication deficit: Secondary | ICD-10-CM | POA: Diagnosis not present

## 2020-10-29 DIAGNOSIS — W19XXXA Unspecified fall, initial encounter: Secondary | ICD-10-CM | POA: Diagnosis not present

## 2020-10-29 DIAGNOSIS — Z7901 Long term (current) use of anticoagulants: Secondary | ICD-10-CM | POA: Diagnosis not present

## 2020-10-29 DIAGNOSIS — R269 Unspecified abnormalities of gait and mobility: Secondary | ICD-10-CM | POA: Diagnosis not present

## 2020-10-29 DIAGNOSIS — Z8673 Personal history of transient ischemic attack (TIA), and cerebral infarction without residual deficits: Secondary | ICD-10-CM | POA: Diagnosis not present

## 2020-10-29 DIAGNOSIS — G319 Degenerative disease of nervous system, unspecified: Secondary | ICD-10-CM | POA: Diagnosis not present

## 2020-10-29 DIAGNOSIS — R2681 Unsteadiness on feet: Secondary | ICD-10-CM | POA: Diagnosis not present

## 2020-10-29 DIAGNOSIS — M6258 Muscle wasting and atrophy, not elsewhere classified, other site: Secondary | ICD-10-CM | POA: Diagnosis not present

## 2020-10-29 DIAGNOSIS — R2689 Other abnormalities of gait and mobility: Secondary | ICD-10-CM | POA: Diagnosis not present

## 2020-10-29 DIAGNOSIS — R0902 Hypoxemia: Secondary | ICD-10-CM | POA: Diagnosis not present

## 2020-10-29 DIAGNOSIS — Z9181 History of falling: Secondary | ICD-10-CM | POA: Diagnosis not present

## 2020-10-29 NOTE — Discharge Instructions (Signed)
Call your primary care doctor or specialist as discussed in the next 2-3 days.   Return immediately back to the ER if:  Your symptoms worsen within the next 12-24 hours. You develop new symptoms such as new fevers, persistent vomiting, new pain, shortness of breath, or new weakness or numbness, or if you have any other concerns.  

## 2020-10-29 NOTE — ED Notes (Signed)
Report has been called to [facility] place facility, AVS papers, medical necessity has been placed at bedside for transport upon their arrival.

## 2020-10-29 NOTE — ED Notes (Signed)
Ptar called by Renardo Cheatum pt is number 4 on the list

## 2020-10-29 NOTE — ED Triage Notes (Signed)
Pt reports having an unwitnessed fall on thinners caused by becoming unbalanced & falling from standing. Landing on his Right side on a tile floor. A bump noticed to his Right lateral forehead. Denies n/v, HA, LOC or any pain. Upon arrival to ED pt is A/Ox4, verbal-able to make needs know. Pupils equal, reactive & brisk.

## 2020-10-29 NOTE — ED Notes (Signed)
Patient denies pain and is resting comfortably.  

## 2020-10-29 NOTE — ED Notes (Signed)
Son at bedside refused pt to wait for PTAR, stated he will bring him home instead. AVS was explained to pt & son, tech is assisting pt to dress & get to sons car.

## 2020-10-29 NOTE — ED Notes (Addendum)
Pt discharged and taken to lobby with this RN receiving report. Discharge assessment not completed by this RN, as this RN never made interaction with pt; pt removed from room and wheeled to ED lobby and new pt placed in bed. Per offgoing RN, "pt only needed documentation of trauma end and outcome"; documentation completed but no vitals obtained prior to patient leaving.

## 2020-10-29 NOTE — ED Notes (Signed)
Patient transported to CT, Trauma RN with pt.

## 2020-10-29 NOTE — ED Notes (Signed)
Secretary informed to cancel PTAR transport for this pt.

## 2020-10-29 NOTE — ED Provider Notes (Signed)
[facility] EMERGENCY DEPARTMENT Provider Note   CSN: [id number] Arrival date & time: 10/29/20  1705     History No chief complaint on file.   Paul Nanna. is a 87 y.o. male.  Patient presents as a level 2 trauma activation.  He presents from a facility with a reported fall.  He has a bump to the right upper forehead region.  No complaints of pain.  He states he lost his balance and fell.  Denies loss of consciousness.  Denies neck pain or back pain or other extremity pain.  Denies fevers cough vomiting or diarrhea.  Patient is on Eliquis for history of atrial flutter.        History reviewed. No pertinent past medical history.  There are no problems to display for this patient.        History reviewed. No pertinent family history.     Home Medications Prior to Admission medications   Not on File    Allergies    Patient has no allergy information on record.  Review of Systems   Review of Systems  Constitutional: Negative for fever.  HENT: Negative for ear pain and sore throat.   Eyes: Negative for pain.  Respiratory: Negative for cough.   Cardiovascular: Negative for chest pain.  Gastrointestinal: Negative for abdominal pain.  Genitourinary: Negative for flank pain.  Musculoskeletal: Negative for back pain.  Skin: Negative for color change and rash.  Neurological: Negative for syncope.  All other systems reviewed and are negative.   Physical Exam Updated Vital Signs BP 139/68   Pulse 64   Temp 97.6 F (36.4 C) (Oral)   Resp 20   Ht 5\' 5"  (1.651 m)   Wt 73.9 kg   SpO2 98%   BMI 27.12 kg/m   Physical Exam Constitutional:      General: He is not in acute distress.    Appearance: He is well-developed.  HENT:     Head: Normocephalic.     Comments: Right forehead 2 x 2 centimeter forehead hematoma noted.  No laceration noted.    Nose: Nose normal.  Eyes:     Extraocular Movements: Extraocular movements intact.   Cardiovascular:     Rate and Rhythm: Normal rate.  Pulmonary:     Effort: Pulmonary effort is normal.  Musculoskeletal:     Comments: No C or T or L-spine step-offs or tenderness noted.  No pain with range of motion of shoulders elbows wrists or hips knees ankles bilaterally.  Skin:    Coloration: Skin is not jaundiced.  Neurological:     Mental Status: He is alert. Mental status is at baseline.     ED Results / Procedures / Treatments   Labs (all labs ordered are listed, but only abnormal results are displayed) Labs Reviewed - No data to display  EKG None  Radiology CT Head Wo Contrast  Result Date: 10/29/2020 CLINICAL DATA:  Head trauma fall EXAM: CT HEAD WITHOUT CONTRAST TECHNIQUE: Contiguous axial images were obtained from the base of the skull through the vertex without intravenous contrast. COMPARISON:  CT brain 08/23/2020 01/22/2020 FINDINGS: Brain: No acute territorial infarction, hemorrhage, or intracranial mass. Small chronic infarct in the right cerebellum. Mild atrophy. Mild white matter hypodensity consistent with chronic small vessel ischemic change. Stable ventricle size. Vascular: No hyperdense vessels.  Carotid vascular calcification. Skull: Normal. Negative for fracture or focal lesion. Sinuses/Orbits: No acute finding. Other: None IMPRESSION: 1. No CT evidence for acute intracranial  abnormality. 2. Atrophy and mild chronic small vessel ischemic changes of the white matter. Electronically Signed   By: Donavan Foil M.D.   On: 10/29/2020 17:36    Procedures .Critical Care E&M Performed by: Luna Fuse, MD  Critical care provider statement:    Critical care time (minutes):  30   Critical care time was exclusive of:  Separately billable procedures and treating other patients and teaching time   Critical care was necessary to treat or prevent imminent or life-threatening deterioration of the following conditions:  Trauma After initial E/M assessment, critical  care services were subsequently performed that were exclusive of separately billable procedures or treatment.       Medications Ordered in ED Medications - No data to display  ED Course  I have reviewed the triage vital signs and the nursing notes.  Pertinent labs & imaging results that were available during my care of the patient were reviewed by me and considered in my medical decision making (see chart for details).    MDM Rules/Calculators/A&P                          Patient appears well denies any pain at this time.  CT of the brain is unremarkable.  Patient observed in the ER with no additional adverse events and no headache no vomiting.  Patient discharged home, advised follow-up with his doctor in 3 to 4 days.  Advised immediate return if he has worsening pain fevers or any additional concerns.  Final Clinical Impression(s) / ED Diagnoses Final diagnoses:  Injury of head, initial encounter    Rx / DC Orders ED Discharge Orders    None       Luna Fuse, MD 10/29/20 1759

## 2020-10-29 NOTE — ED Notes (Signed)
Ptar cancelled son will take pt home per nurse stated

## 2020-10-30 ENCOUNTER — Encounter (HOSPITAL_COMMUNITY): Payer: Self-pay | Admitting: *Deleted

## 2020-10-30 DIAGNOSIS — R278 Other lack of coordination: Secondary | ICD-10-CM | POA: Diagnosis not present

## 2020-10-30 DIAGNOSIS — R2681 Unsteadiness on feet: Secondary | ICD-10-CM | POA: Diagnosis not present

## 2020-10-30 DIAGNOSIS — Z9181 History of falling: Secondary | ICD-10-CM | POA: Diagnosis not present

## 2020-10-30 DIAGNOSIS — R2689 Other abnormalities of gait and mobility: Secondary | ICD-10-CM | POA: Diagnosis not present

## 2020-10-30 DIAGNOSIS — R498 Other voice and resonance disorders: Secondary | ICD-10-CM | POA: Diagnosis not present

## 2020-10-30 DIAGNOSIS — Z8673 Personal history of transient ischemic attack (TIA), and cerebral infarction without residual deficits: Secondary | ICD-10-CM | POA: Diagnosis not present

## 2020-10-30 DIAGNOSIS — M6281 Muscle weakness (generalized): Secondary | ICD-10-CM | POA: Diagnosis not present

## 2020-10-30 DIAGNOSIS — R269 Unspecified abnormalities of gait and mobility: Secondary | ICD-10-CM | POA: Diagnosis not present

## 2020-10-30 DIAGNOSIS — R41841 Cognitive communication deficit: Secondary | ICD-10-CM | POA: Diagnosis not present

## 2020-10-30 DIAGNOSIS — M6258 Muscle wasting and atrophy, not elsewhere classified, other site: Secondary | ICD-10-CM | POA: Diagnosis not present

## 2020-10-31 DIAGNOSIS — R41841 Cognitive communication deficit: Secondary | ICD-10-CM | POA: Diagnosis not present

## 2020-10-31 DIAGNOSIS — R2689 Other abnormalities of gait and mobility: Secondary | ICD-10-CM | POA: Diagnosis not present

## 2020-10-31 DIAGNOSIS — M6281 Muscle weakness (generalized): Secondary | ICD-10-CM | POA: Diagnosis not present

## 2020-10-31 DIAGNOSIS — Z8673 Personal history of transient ischemic attack (TIA), and cerebral infarction without residual deficits: Secondary | ICD-10-CM | POA: Diagnosis not present

## 2020-10-31 DIAGNOSIS — R2681 Unsteadiness on feet: Secondary | ICD-10-CM | POA: Diagnosis not present

## 2020-10-31 DIAGNOSIS — R278 Other lack of coordination: Secondary | ICD-10-CM | POA: Diagnosis not present

## 2020-10-31 DIAGNOSIS — R269 Unspecified abnormalities of gait and mobility: Secondary | ICD-10-CM | POA: Diagnosis not present

## 2020-10-31 DIAGNOSIS — M6258 Muscle wasting and atrophy, not elsewhere classified, other site: Secondary | ICD-10-CM | POA: Diagnosis not present

## 2020-10-31 DIAGNOSIS — Z9181 History of falling: Secondary | ICD-10-CM | POA: Diagnosis not present

## 2020-10-31 DIAGNOSIS — R498 Other voice and resonance disorders: Secondary | ICD-10-CM | POA: Diagnosis not present

## 2020-11-01 DIAGNOSIS — R498 Other voice and resonance disorders: Secondary | ICD-10-CM | POA: Diagnosis not present

## 2020-11-01 DIAGNOSIS — Z9181 History of falling: Secondary | ICD-10-CM | POA: Diagnosis not present

## 2020-11-01 DIAGNOSIS — R41841 Cognitive communication deficit: Secondary | ICD-10-CM | POA: Diagnosis not present

## 2020-11-01 DIAGNOSIS — R269 Unspecified abnormalities of gait and mobility: Secondary | ICD-10-CM | POA: Diagnosis not present

## 2020-11-01 DIAGNOSIS — Z8673 Personal history of transient ischemic attack (TIA), and cerebral infarction without residual deficits: Secondary | ICD-10-CM | POA: Diagnosis not present

## 2020-11-01 DIAGNOSIS — R278 Other lack of coordination: Secondary | ICD-10-CM | POA: Diagnosis not present

## 2020-11-01 DIAGNOSIS — R2689 Other abnormalities of gait and mobility: Secondary | ICD-10-CM | POA: Diagnosis not present

## 2020-11-01 DIAGNOSIS — R2681 Unsteadiness on feet: Secondary | ICD-10-CM | POA: Diagnosis not present

## 2020-11-01 DIAGNOSIS — M6281 Muscle weakness (generalized): Secondary | ICD-10-CM | POA: Diagnosis not present

## 2020-11-01 DIAGNOSIS — M6258 Muscle wasting and atrophy, not elsewhere classified, other site: Secondary | ICD-10-CM | POA: Diagnosis not present

## 2020-11-03 DIAGNOSIS — M6281 Muscle weakness (generalized): Secondary | ICD-10-CM | POA: Diagnosis not present

## 2020-11-03 DIAGNOSIS — M6258 Muscle wasting and atrophy, not elsewhere classified, other site: Secondary | ICD-10-CM | POA: Diagnosis not present

## 2020-11-03 DIAGNOSIS — R278 Other lack of coordination: Secondary | ICD-10-CM | POA: Diagnosis not present

## 2020-11-03 DIAGNOSIS — Z9181 History of falling: Secondary | ICD-10-CM | POA: Diagnosis not present

## 2020-11-03 DIAGNOSIS — R2681 Unsteadiness on feet: Secondary | ICD-10-CM | POA: Diagnosis not present

## 2020-11-03 DIAGNOSIS — R498 Other voice and resonance disorders: Secondary | ICD-10-CM | POA: Diagnosis not present

## 2020-11-03 DIAGNOSIS — R2689 Other abnormalities of gait and mobility: Secondary | ICD-10-CM | POA: Diagnosis not present

## 2020-11-03 DIAGNOSIS — R41841 Cognitive communication deficit: Secondary | ICD-10-CM | POA: Diagnosis not present

## 2020-11-03 DIAGNOSIS — R269 Unspecified abnormalities of gait and mobility: Secondary | ICD-10-CM | POA: Diagnosis not present

## 2020-11-03 DIAGNOSIS — Z8673 Personal history of transient ischemic attack (TIA), and cerebral infarction without residual deficits: Secondary | ICD-10-CM | POA: Diagnosis not present

## 2020-11-04 DIAGNOSIS — Z9181 History of falling: Secondary | ICD-10-CM | POA: Diagnosis not present

## 2020-11-04 DIAGNOSIS — R498 Other voice and resonance disorders: Secondary | ICD-10-CM | POA: Diagnosis not present

## 2020-11-04 DIAGNOSIS — M6281 Muscle weakness (generalized): Secondary | ICD-10-CM | POA: Diagnosis not present

## 2020-11-04 DIAGNOSIS — R41841 Cognitive communication deficit: Secondary | ICD-10-CM | POA: Diagnosis not present

## 2020-11-04 DIAGNOSIS — R2689 Other abnormalities of gait and mobility: Secondary | ICD-10-CM | POA: Diagnosis not present

## 2020-11-04 DIAGNOSIS — Z8673 Personal history of transient ischemic attack (TIA), and cerebral infarction without residual deficits: Secondary | ICD-10-CM | POA: Diagnosis not present

## 2020-11-04 DIAGNOSIS — R278 Other lack of coordination: Secondary | ICD-10-CM | POA: Diagnosis not present

## 2020-11-04 DIAGNOSIS — R2681 Unsteadiness on feet: Secondary | ICD-10-CM | POA: Diagnosis not present

## 2020-11-04 DIAGNOSIS — M6258 Muscle wasting and atrophy, not elsewhere classified, other site: Secondary | ICD-10-CM | POA: Diagnosis not present

## 2020-11-04 DIAGNOSIS — R269 Unspecified abnormalities of gait and mobility: Secondary | ICD-10-CM | POA: Diagnosis not present

## 2020-11-05 DIAGNOSIS — R498 Other voice and resonance disorders: Secondary | ICD-10-CM | POA: Diagnosis not present

## 2020-11-05 DIAGNOSIS — R269 Unspecified abnormalities of gait and mobility: Secondary | ICD-10-CM | POA: Diagnosis not present

## 2020-11-05 DIAGNOSIS — R2681 Unsteadiness on feet: Secondary | ICD-10-CM | POA: Diagnosis not present

## 2020-11-05 DIAGNOSIS — Z8673 Personal history of transient ischemic attack (TIA), and cerebral infarction without residual deficits: Secondary | ICD-10-CM | POA: Diagnosis not present

## 2020-11-05 DIAGNOSIS — R2689 Other abnormalities of gait and mobility: Secondary | ICD-10-CM | POA: Diagnosis not present

## 2020-11-05 DIAGNOSIS — R41841 Cognitive communication deficit: Secondary | ICD-10-CM | POA: Diagnosis not present

## 2020-11-05 DIAGNOSIS — M6258 Muscle wasting and atrophy, not elsewhere classified, other site: Secondary | ICD-10-CM | POA: Diagnosis not present

## 2020-11-05 DIAGNOSIS — R278 Other lack of coordination: Secondary | ICD-10-CM | POA: Diagnosis not present

## 2020-11-05 DIAGNOSIS — M6281 Muscle weakness (generalized): Secondary | ICD-10-CM | POA: Diagnosis not present

## 2020-11-05 DIAGNOSIS — Z9181 History of falling: Secondary | ICD-10-CM | POA: Diagnosis not present

## 2020-11-06 DIAGNOSIS — M6258 Muscle wasting and atrophy, not elsewhere classified, other site: Secondary | ICD-10-CM | POA: Diagnosis not present

## 2020-11-06 DIAGNOSIS — I110 Hypertensive heart disease with heart failure: Secondary | ICD-10-CM | POA: Diagnosis not present

## 2020-11-06 DIAGNOSIS — R269 Unspecified abnormalities of gait and mobility: Secondary | ICD-10-CM | POA: Diagnosis not present

## 2020-11-06 DIAGNOSIS — M6281 Muscle weakness (generalized): Secondary | ICD-10-CM | POA: Diagnosis not present

## 2020-11-06 DIAGNOSIS — I5040 Unspecified combined systolic (congestive) and diastolic (congestive) heart failure: Secondary | ICD-10-CM | POA: Diagnosis not present

## 2020-11-06 DIAGNOSIS — R498 Other voice and resonance disorders: Secondary | ICD-10-CM | POA: Diagnosis not present

## 2020-11-06 DIAGNOSIS — R2681 Unsteadiness on feet: Secondary | ICD-10-CM | POA: Diagnosis not present

## 2020-11-06 DIAGNOSIS — R41841 Cognitive communication deficit: Secondary | ICD-10-CM | POA: Diagnosis not present

## 2020-11-06 DIAGNOSIS — R2689 Other abnormalities of gait and mobility: Secondary | ICD-10-CM | POA: Diagnosis not present

## 2020-11-06 DIAGNOSIS — Z9181 History of falling: Secondary | ICD-10-CM | POA: Diagnosis not present

## 2020-11-06 DIAGNOSIS — Z8673 Personal history of transient ischemic attack (TIA), and cerebral infarction without residual deficits: Secondary | ICD-10-CM | POA: Diagnosis not present

## 2020-11-06 DIAGNOSIS — Z79899 Other long term (current) drug therapy: Secondary | ICD-10-CM | POA: Diagnosis not present

## 2020-11-06 DIAGNOSIS — K5901 Slow transit constipation: Secondary | ICD-10-CM | POA: Diagnosis not present

## 2020-11-06 DIAGNOSIS — R278 Other lack of coordination: Secondary | ICD-10-CM | POA: Diagnosis not present

## 2020-11-07 DIAGNOSIS — Z9181 History of falling: Secondary | ICD-10-CM | POA: Diagnosis not present

## 2020-11-07 DIAGNOSIS — R41841 Cognitive communication deficit: Secondary | ICD-10-CM | POA: Diagnosis not present

## 2020-11-07 DIAGNOSIS — R2689 Other abnormalities of gait and mobility: Secondary | ICD-10-CM | POA: Diagnosis not present

## 2020-11-07 DIAGNOSIS — R278 Other lack of coordination: Secondary | ICD-10-CM | POA: Diagnosis not present

## 2020-11-07 DIAGNOSIS — Z8673 Personal history of transient ischemic attack (TIA), and cerebral infarction without residual deficits: Secondary | ICD-10-CM | POA: Diagnosis not present

## 2020-11-07 DIAGNOSIS — R2681 Unsteadiness on feet: Secondary | ICD-10-CM | POA: Diagnosis not present

## 2020-11-07 DIAGNOSIS — M6281 Muscle weakness (generalized): Secondary | ICD-10-CM | POA: Diagnosis not present

## 2020-11-07 DIAGNOSIS — R498 Other voice and resonance disorders: Secondary | ICD-10-CM | POA: Diagnosis not present

## 2020-11-07 DIAGNOSIS — M6258 Muscle wasting and atrophy, not elsewhere classified, other site: Secondary | ICD-10-CM | POA: Diagnosis not present

## 2020-11-07 DIAGNOSIS — R269 Unspecified abnormalities of gait and mobility: Secondary | ICD-10-CM | POA: Diagnosis not present

## 2020-11-09 DIAGNOSIS — R269 Unspecified abnormalities of gait and mobility: Secondary | ICD-10-CM | POA: Diagnosis not present

## 2020-11-09 DIAGNOSIS — M6258 Muscle wasting and atrophy, not elsewhere classified, other site: Secondary | ICD-10-CM | POA: Diagnosis not present

## 2020-11-09 DIAGNOSIS — R41841 Cognitive communication deficit: Secondary | ICD-10-CM | POA: Diagnosis not present

## 2020-11-09 DIAGNOSIS — R2681 Unsteadiness on feet: Secondary | ICD-10-CM | POA: Diagnosis not present

## 2020-11-09 DIAGNOSIS — Z8673 Personal history of transient ischemic attack (TIA), and cerebral infarction without residual deficits: Secondary | ICD-10-CM | POA: Diagnosis not present

## 2020-11-09 DIAGNOSIS — R498 Other voice and resonance disorders: Secondary | ICD-10-CM | POA: Diagnosis not present

## 2020-11-09 DIAGNOSIS — R2689 Other abnormalities of gait and mobility: Secondary | ICD-10-CM | POA: Diagnosis not present

## 2020-11-09 DIAGNOSIS — R278 Other lack of coordination: Secondary | ICD-10-CM | POA: Diagnosis not present

## 2020-11-09 DIAGNOSIS — Z9181 History of falling: Secondary | ICD-10-CM | POA: Diagnosis not present

## 2020-11-09 DIAGNOSIS — M6281 Muscle weakness (generalized): Secondary | ICD-10-CM | POA: Diagnosis not present

## 2020-11-10 DIAGNOSIS — R269 Unspecified abnormalities of gait and mobility: Secondary | ICD-10-CM | POA: Diagnosis not present

## 2020-11-10 DIAGNOSIS — Z9181 History of falling: Secondary | ICD-10-CM | POA: Diagnosis not present

## 2020-11-10 DIAGNOSIS — R278 Other lack of coordination: Secondary | ICD-10-CM | POA: Diagnosis not present

## 2020-11-10 DIAGNOSIS — M6258 Muscle wasting and atrophy, not elsewhere classified, other site: Secondary | ICD-10-CM | POA: Diagnosis not present

## 2020-11-10 DIAGNOSIS — R498 Other voice and resonance disorders: Secondary | ICD-10-CM | POA: Diagnosis not present

## 2020-11-10 DIAGNOSIS — R2681 Unsteadiness on feet: Secondary | ICD-10-CM | POA: Diagnosis not present

## 2020-11-10 DIAGNOSIS — M6281 Muscle weakness (generalized): Secondary | ICD-10-CM | POA: Diagnosis not present

## 2020-11-10 DIAGNOSIS — R2689 Other abnormalities of gait and mobility: Secondary | ICD-10-CM | POA: Diagnosis not present

## 2020-11-10 DIAGNOSIS — R41841 Cognitive communication deficit: Secondary | ICD-10-CM | POA: Diagnosis not present

## 2020-11-10 DIAGNOSIS — Z8673 Personal history of transient ischemic attack (TIA), and cerebral infarction without residual deficits: Secondary | ICD-10-CM | POA: Diagnosis not present

## 2020-11-11 DIAGNOSIS — M6258 Muscle wasting and atrophy, not elsewhere classified, other site: Secondary | ICD-10-CM | POA: Diagnosis not present

## 2020-11-11 DIAGNOSIS — R269 Unspecified abnormalities of gait and mobility: Secondary | ICD-10-CM | POA: Diagnosis not present

## 2020-11-11 DIAGNOSIS — M6281 Muscle weakness (generalized): Secondary | ICD-10-CM | POA: Diagnosis not present

## 2020-11-11 DIAGNOSIS — R2689 Other abnormalities of gait and mobility: Secondary | ICD-10-CM | POA: Diagnosis not present

## 2020-11-11 DIAGNOSIS — Z8673 Personal history of transient ischemic attack (TIA), and cerebral infarction without residual deficits: Secondary | ICD-10-CM | POA: Diagnosis not present

## 2020-11-11 DIAGNOSIS — R41841 Cognitive communication deficit: Secondary | ICD-10-CM | POA: Diagnosis not present

## 2020-11-11 DIAGNOSIS — R2681 Unsteadiness on feet: Secondary | ICD-10-CM | POA: Diagnosis not present

## 2020-11-11 DIAGNOSIS — Z9181 History of falling: Secondary | ICD-10-CM | POA: Diagnosis not present

## 2020-11-11 DIAGNOSIS — R498 Other voice and resonance disorders: Secondary | ICD-10-CM | POA: Diagnosis not present

## 2020-11-11 DIAGNOSIS — R278 Other lack of coordination: Secondary | ICD-10-CM | POA: Diagnosis not present

## 2020-11-12 DIAGNOSIS — R41841 Cognitive communication deficit: Secondary | ICD-10-CM | POA: Diagnosis not present

## 2020-11-12 DIAGNOSIS — Z8673 Personal history of transient ischemic attack (TIA), and cerebral infarction without residual deficits: Secondary | ICD-10-CM | POA: Diagnosis not present

## 2020-11-12 DIAGNOSIS — R498 Other voice and resonance disorders: Secondary | ICD-10-CM | POA: Diagnosis not present

## 2020-11-12 DIAGNOSIS — R278 Other lack of coordination: Secondary | ICD-10-CM | POA: Diagnosis not present

## 2020-11-12 DIAGNOSIS — M6258 Muscle wasting and atrophy, not elsewhere classified, other site: Secondary | ICD-10-CM | POA: Diagnosis not present

## 2020-11-12 DIAGNOSIS — R2681 Unsteadiness on feet: Secondary | ICD-10-CM | POA: Diagnosis not present

## 2020-11-12 DIAGNOSIS — Z9181 History of falling: Secondary | ICD-10-CM | POA: Diagnosis not present

## 2020-11-12 DIAGNOSIS — M6281 Muscle weakness (generalized): Secondary | ICD-10-CM | POA: Diagnosis not present

## 2020-11-12 DIAGNOSIS — R269 Unspecified abnormalities of gait and mobility: Secondary | ICD-10-CM | POA: Diagnosis not present

## 2020-11-12 DIAGNOSIS — R2689 Other abnormalities of gait and mobility: Secondary | ICD-10-CM | POA: Diagnosis not present

## 2020-11-13 DIAGNOSIS — R498 Other voice and resonance disorders: Secondary | ICD-10-CM | POA: Diagnosis not present

## 2020-11-13 DIAGNOSIS — M6281 Muscle weakness (generalized): Secondary | ICD-10-CM | POA: Diagnosis not present

## 2020-11-13 DIAGNOSIS — R41841 Cognitive communication deficit: Secondary | ICD-10-CM | POA: Diagnosis not present

## 2020-11-13 DIAGNOSIS — R278 Other lack of coordination: Secondary | ICD-10-CM | POA: Diagnosis not present

## 2020-11-13 DIAGNOSIS — R2681 Unsteadiness on feet: Secondary | ICD-10-CM | POA: Diagnosis not present

## 2020-11-13 DIAGNOSIS — R269 Unspecified abnormalities of gait and mobility: Secondary | ICD-10-CM | POA: Diagnosis not present

## 2020-11-13 DIAGNOSIS — Z8673 Personal history of transient ischemic attack (TIA), and cerebral infarction without residual deficits: Secondary | ICD-10-CM | POA: Diagnosis not present

## 2020-11-13 DIAGNOSIS — M6258 Muscle wasting and atrophy, not elsewhere classified, other site: Secondary | ICD-10-CM | POA: Diagnosis not present

## 2020-11-13 DIAGNOSIS — Z9181 History of falling: Secondary | ICD-10-CM | POA: Diagnosis not present

## 2020-11-13 DIAGNOSIS — R2689 Other abnormalities of gait and mobility: Secondary | ICD-10-CM | POA: Diagnosis not present

## 2020-11-14 DIAGNOSIS — R498 Other voice and resonance disorders: Secondary | ICD-10-CM | POA: Diagnosis not present

## 2020-11-14 DIAGNOSIS — R269 Unspecified abnormalities of gait and mobility: Secondary | ICD-10-CM | POA: Diagnosis not present

## 2020-11-14 DIAGNOSIS — R41841 Cognitive communication deficit: Secondary | ICD-10-CM | POA: Diagnosis not present

## 2020-11-14 DIAGNOSIS — Z8673 Personal history of transient ischemic attack (TIA), and cerebral infarction without residual deficits: Secondary | ICD-10-CM | POA: Diagnosis not present

## 2020-11-14 DIAGNOSIS — Z9181 History of falling: Secondary | ICD-10-CM | POA: Diagnosis not present

## 2020-11-14 DIAGNOSIS — R2681 Unsteadiness on feet: Secondary | ICD-10-CM | POA: Diagnosis not present

## 2020-11-14 DIAGNOSIS — M6258 Muscle wasting and atrophy, not elsewhere classified, other site: Secondary | ICD-10-CM | POA: Diagnosis not present

## 2020-11-14 DIAGNOSIS — R278 Other lack of coordination: Secondary | ICD-10-CM | POA: Diagnosis not present

## 2020-11-14 DIAGNOSIS — R2689 Other abnormalities of gait and mobility: Secondary | ICD-10-CM | POA: Diagnosis not present

## 2020-11-14 DIAGNOSIS — M6281 Muscle weakness (generalized): Secondary | ICD-10-CM | POA: Diagnosis not present

## 2020-11-15 DIAGNOSIS — R41841 Cognitive communication deficit: Secondary | ICD-10-CM | POA: Diagnosis not present

## 2020-11-15 DIAGNOSIS — M6258 Muscle wasting and atrophy, not elsewhere classified, other site: Secondary | ICD-10-CM | POA: Diagnosis not present

## 2020-11-15 DIAGNOSIS — R498 Other voice and resonance disorders: Secondary | ICD-10-CM | POA: Diagnosis not present

## 2020-11-15 DIAGNOSIS — Z9181 History of falling: Secondary | ICD-10-CM | POA: Diagnosis not present

## 2020-11-15 DIAGNOSIS — R278 Other lack of coordination: Secondary | ICD-10-CM | POA: Diagnosis not present

## 2020-11-15 DIAGNOSIS — R2689 Other abnormalities of gait and mobility: Secondary | ICD-10-CM | POA: Diagnosis not present

## 2020-11-15 DIAGNOSIS — M6281 Muscle weakness (generalized): Secondary | ICD-10-CM | POA: Diagnosis not present

## 2020-11-15 DIAGNOSIS — Z8673 Personal history of transient ischemic attack (TIA), and cerebral infarction without residual deficits: Secondary | ICD-10-CM | POA: Diagnosis not present

## 2020-11-15 DIAGNOSIS — R269 Unspecified abnormalities of gait and mobility: Secondary | ICD-10-CM | POA: Diagnosis not present

## 2020-11-15 DIAGNOSIS — R2681 Unsteadiness on feet: Secondary | ICD-10-CM | POA: Diagnosis not present

## 2020-11-17 DIAGNOSIS — R41841 Cognitive communication deficit: Secondary | ICD-10-CM | POA: Diagnosis not present

## 2020-11-17 DIAGNOSIS — R2681 Unsteadiness on feet: Secondary | ICD-10-CM | POA: Diagnosis not present

## 2020-11-17 DIAGNOSIS — I110 Hypertensive heart disease with heart failure: Secondary | ICD-10-CM | POA: Diagnosis not present

## 2020-11-17 DIAGNOSIS — F411 Generalized anxiety disorder: Secondary | ICD-10-CM | POA: Diagnosis not present

## 2020-11-17 DIAGNOSIS — R498 Other voice and resonance disorders: Secondary | ICD-10-CM | POA: Diagnosis not present

## 2020-11-17 DIAGNOSIS — R269 Unspecified abnormalities of gait and mobility: Secondary | ICD-10-CM | POA: Diagnosis not present

## 2020-11-17 DIAGNOSIS — F331 Major depressive disorder, recurrent, moderate: Secondary | ICD-10-CM | POA: Diagnosis not present

## 2020-11-17 DIAGNOSIS — F419 Anxiety disorder, unspecified: Secondary | ICD-10-CM | POA: Diagnosis not present

## 2020-11-17 DIAGNOSIS — R2689 Other abnormalities of gait and mobility: Secondary | ICD-10-CM | POA: Diagnosis not present

## 2020-11-17 DIAGNOSIS — M6281 Muscle weakness (generalized): Secondary | ICD-10-CM | POA: Diagnosis not present

## 2020-11-17 DIAGNOSIS — Z8673 Personal history of transient ischemic attack (TIA), and cerebral infarction without residual deficits: Secondary | ICD-10-CM | POA: Diagnosis not present

## 2020-11-17 DIAGNOSIS — G250 Essential tremor: Secondary | ICD-10-CM | POA: Diagnosis not present

## 2020-11-17 DIAGNOSIS — M6258 Muscle wasting and atrophy, not elsewhere classified, other site: Secondary | ICD-10-CM | POA: Diagnosis not present

## 2020-11-17 DIAGNOSIS — R278 Other lack of coordination: Secondary | ICD-10-CM | POA: Diagnosis not present

## 2020-11-17 DIAGNOSIS — Z9181 History of falling: Secondary | ICD-10-CM | POA: Diagnosis not present

## 2020-11-18 DIAGNOSIS — R278 Other lack of coordination: Secondary | ICD-10-CM | POA: Diagnosis not present

## 2020-11-18 DIAGNOSIS — R41841 Cognitive communication deficit: Secondary | ICD-10-CM | POA: Diagnosis not present

## 2020-11-18 DIAGNOSIS — M6281 Muscle weakness (generalized): Secondary | ICD-10-CM | POA: Diagnosis not present

## 2020-11-18 DIAGNOSIS — E7849 Other hyperlipidemia: Secondary | ICD-10-CM | POA: Diagnosis not present

## 2020-11-18 DIAGNOSIS — I5041 Acute combined systolic (congestive) and diastolic (congestive) heart failure: Secondary | ICD-10-CM | POA: Diagnosis not present

## 2020-11-18 DIAGNOSIS — R2689 Other abnormalities of gait and mobility: Secondary | ICD-10-CM | POA: Diagnosis not present

## 2020-11-18 DIAGNOSIS — Z8673 Personal history of transient ischemic attack (TIA), and cerebral infarction without residual deficits: Secondary | ICD-10-CM | POA: Diagnosis not present

## 2020-11-18 DIAGNOSIS — I110 Hypertensive heart disease with heart failure: Secondary | ICD-10-CM | POA: Diagnosis not present

## 2020-11-18 DIAGNOSIS — M6258 Muscle wasting and atrophy, not elsewhere classified, other site: Secondary | ICD-10-CM | POA: Diagnosis not present

## 2020-11-18 DIAGNOSIS — R2681 Unsteadiness on feet: Secondary | ICD-10-CM | POA: Diagnosis not present

## 2020-11-18 DIAGNOSIS — R269 Unspecified abnormalities of gait and mobility: Secondary | ICD-10-CM | POA: Diagnosis not present

## 2020-11-18 DIAGNOSIS — Z9181 History of falling: Secondary | ICD-10-CM | POA: Diagnosis not present

## 2020-11-18 DIAGNOSIS — R498 Other voice and resonance disorders: Secondary | ICD-10-CM | POA: Diagnosis not present

## 2020-11-18 DIAGNOSIS — I480 Paroxysmal atrial fibrillation: Secondary | ICD-10-CM | POA: Diagnosis not present

## 2020-11-19 DIAGNOSIS — Z9181 History of falling: Secondary | ICD-10-CM | POA: Diagnosis not present

## 2020-11-19 DIAGNOSIS — M6281 Muscle weakness (generalized): Secondary | ICD-10-CM | POA: Diagnosis not present

## 2020-11-19 DIAGNOSIS — R2689 Other abnormalities of gait and mobility: Secondary | ICD-10-CM | POA: Diagnosis not present

## 2020-11-19 DIAGNOSIS — R2681 Unsteadiness on feet: Secondary | ICD-10-CM | POA: Diagnosis not present

## 2020-11-19 DIAGNOSIS — M6258 Muscle wasting and atrophy, not elsewhere classified, other site: Secondary | ICD-10-CM | POA: Diagnosis not present

## 2020-11-19 DIAGNOSIS — R278 Other lack of coordination: Secondary | ICD-10-CM | POA: Diagnosis not present

## 2020-11-19 DIAGNOSIS — R269 Unspecified abnormalities of gait and mobility: Secondary | ICD-10-CM | POA: Diagnosis not present

## 2020-11-19 DIAGNOSIS — R41841 Cognitive communication deficit: Secondary | ICD-10-CM | POA: Diagnosis not present

## 2020-11-19 DIAGNOSIS — R498 Other voice and resonance disorders: Secondary | ICD-10-CM | POA: Diagnosis not present

## 2020-11-19 DIAGNOSIS — Z8673 Personal history of transient ischemic attack (TIA), and cerebral infarction without residual deficits: Secondary | ICD-10-CM | POA: Diagnosis not present

## 2020-11-20 DIAGNOSIS — R269 Unspecified abnormalities of gait and mobility: Secondary | ICD-10-CM | POA: Diagnosis not present

## 2020-11-20 DIAGNOSIS — R41841 Cognitive communication deficit: Secondary | ICD-10-CM | POA: Diagnosis not present

## 2020-11-20 DIAGNOSIS — Z9181 History of falling: Secondary | ICD-10-CM | POA: Diagnosis not present

## 2020-11-20 DIAGNOSIS — R498 Other voice and resonance disorders: Secondary | ICD-10-CM | POA: Diagnosis not present

## 2020-11-20 DIAGNOSIS — R2681 Unsteadiness on feet: Secondary | ICD-10-CM | POA: Diagnosis not present

## 2020-11-20 DIAGNOSIS — M6281 Muscle weakness (generalized): Secondary | ICD-10-CM | POA: Diagnosis not present

## 2020-11-20 DIAGNOSIS — R278 Other lack of coordination: Secondary | ICD-10-CM | POA: Diagnosis not present

## 2020-11-20 DIAGNOSIS — M6258 Muscle wasting and atrophy, not elsewhere classified, other site: Secondary | ICD-10-CM | POA: Diagnosis not present

## 2020-11-20 DIAGNOSIS — Z8673 Personal history of transient ischemic attack (TIA), and cerebral infarction without residual deficits: Secondary | ICD-10-CM | POA: Diagnosis not present

## 2020-11-20 DIAGNOSIS — R2689 Other abnormalities of gait and mobility: Secondary | ICD-10-CM | POA: Diagnosis not present

## 2020-11-21 DIAGNOSIS — Z9181 History of falling: Secondary | ICD-10-CM | POA: Diagnosis not present

## 2020-11-21 DIAGNOSIS — M6281 Muscle weakness (generalized): Secondary | ICD-10-CM | POA: Diagnosis not present

## 2020-11-21 DIAGNOSIS — Z8673 Personal history of transient ischemic attack (TIA), and cerebral infarction without residual deficits: Secondary | ICD-10-CM | POA: Diagnosis not present

## 2020-11-21 DIAGNOSIS — R2681 Unsteadiness on feet: Secondary | ICD-10-CM | POA: Diagnosis not present

## 2020-11-21 DIAGNOSIS — R269 Unspecified abnormalities of gait and mobility: Secondary | ICD-10-CM | POA: Diagnosis not present

## 2020-11-21 DIAGNOSIS — M6258 Muscle wasting and atrophy, not elsewhere classified, other site: Secondary | ICD-10-CM | POA: Diagnosis not present

## 2020-11-21 DIAGNOSIS — R498 Other voice and resonance disorders: Secondary | ICD-10-CM | POA: Diagnosis not present

## 2020-11-21 DIAGNOSIS — R41841 Cognitive communication deficit: Secondary | ICD-10-CM | POA: Diagnosis not present

## 2020-11-21 DIAGNOSIS — R278 Other lack of coordination: Secondary | ICD-10-CM | POA: Diagnosis not present

## 2020-11-21 DIAGNOSIS — R2689 Other abnormalities of gait and mobility: Secondary | ICD-10-CM | POA: Diagnosis not present

## 2020-11-22 DIAGNOSIS — M6258 Muscle wasting and atrophy, not elsewhere classified, other site: Secondary | ICD-10-CM | POA: Diagnosis not present

## 2020-11-22 DIAGNOSIS — R41841 Cognitive communication deficit: Secondary | ICD-10-CM | POA: Diagnosis not present

## 2020-11-22 DIAGNOSIS — Z9181 History of falling: Secondary | ICD-10-CM | POA: Diagnosis not present

## 2020-11-22 DIAGNOSIS — R269 Unspecified abnormalities of gait and mobility: Secondary | ICD-10-CM | POA: Diagnosis not present

## 2020-11-22 DIAGNOSIS — Z8673 Personal history of transient ischemic attack (TIA), and cerebral infarction without residual deficits: Secondary | ICD-10-CM | POA: Diagnosis not present

## 2020-11-22 DIAGNOSIS — R278 Other lack of coordination: Secondary | ICD-10-CM | POA: Diagnosis not present

## 2020-11-22 DIAGNOSIS — M6281 Muscle weakness (generalized): Secondary | ICD-10-CM | POA: Diagnosis not present

## 2020-11-22 DIAGNOSIS — R2681 Unsteadiness on feet: Secondary | ICD-10-CM | POA: Diagnosis not present

## 2020-11-22 DIAGNOSIS — R2689 Other abnormalities of gait and mobility: Secondary | ICD-10-CM | POA: Diagnosis not present

## 2020-11-22 DIAGNOSIS — R498 Other voice and resonance disorders: Secondary | ICD-10-CM | POA: Diagnosis not present

## 2020-11-23 DIAGNOSIS — R278 Other lack of coordination: Secondary | ICD-10-CM | POA: Diagnosis not present

## 2020-11-23 DIAGNOSIS — R2689 Other abnormalities of gait and mobility: Secondary | ICD-10-CM | POA: Diagnosis not present

## 2020-11-23 DIAGNOSIS — R498 Other voice and resonance disorders: Secondary | ICD-10-CM | POA: Diagnosis not present

## 2020-11-23 DIAGNOSIS — R269 Unspecified abnormalities of gait and mobility: Secondary | ICD-10-CM | POA: Diagnosis not present

## 2020-11-23 DIAGNOSIS — Z8673 Personal history of transient ischemic attack (TIA), and cerebral infarction without residual deficits: Secondary | ICD-10-CM | POA: Diagnosis not present

## 2020-11-23 DIAGNOSIS — M6258 Muscle wasting and atrophy, not elsewhere classified, other site: Secondary | ICD-10-CM | POA: Diagnosis not present

## 2020-11-23 DIAGNOSIS — R41841 Cognitive communication deficit: Secondary | ICD-10-CM | POA: Diagnosis not present

## 2020-11-23 DIAGNOSIS — R2681 Unsteadiness on feet: Secondary | ICD-10-CM | POA: Diagnosis not present

## 2020-11-23 DIAGNOSIS — M6281 Muscle weakness (generalized): Secondary | ICD-10-CM | POA: Diagnosis not present

## 2020-11-23 DIAGNOSIS — Z9181 History of falling: Secondary | ICD-10-CM | POA: Diagnosis not present

## 2020-11-24 DIAGNOSIS — M6281 Muscle weakness (generalized): Secondary | ICD-10-CM | POA: Diagnosis not present

## 2020-11-24 DIAGNOSIS — Z8673 Personal history of transient ischemic attack (TIA), and cerebral infarction without residual deficits: Secondary | ICD-10-CM | POA: Diagnosis not present

## 2020-11-24 DIAGNOSIS — Z9181 History of falling: Secondary | ICD-10-CM | POA: Diagnosis not present

## 2020-11-24 DIAGNOSIS — R41841 Cognitive communication deficit: Secondary | ICD-10-CM | POA: Diagnosis not present

## 2020-11-24 DIAGNOSIS — R278 Other lack of coordination: Secondary | ICD-10-CM | POA: Diagnosis not present

## 2020-11-24 DIAGNOSIS — M6258 Muscle wasting and atrophy, not elsewhere classified, other site: Secondary | ICD-10-CM | POA: Diagnosis not present

## 2020-11-24 DIAGNOSIS — R2689 Other abnormalities of gait and mobility: Secondary | ICD-10-CM | POA: Diagnosis not present

## 2020-11-24 DIAGNOSIS — R269 Unspecified abnormalities of gait and mobility: Secondary | ICD-10-CM | POA: Diagnosis not present

## 2020-11-24 DIAGNOSIS — R498 Other voice and resonance disorders: Secondary | ICD-10-CM | POA: Diagnosis not present

## 2020-11-24 DIAGNOSIS — R2681 Unsteadiness on feet: Secondary | ICD-10-CM | POA: Diagnosis not present

## 2020-11-25 DIAGNOSIS — Z9181 History of falling: Secondary | ICD-10-CM | POA: Diagnosis not present

## 2020-11-25 DIAGNOSIS — R2689 Other abnormalities of gait and mobility: Secondary | ICD-10-CM | POA: Diagnosis not present

## 2020-11-25 DIAGNOSIS — R269 Unspecified abnormalities of gait and mobility: Secondary | ICD-10-CM | POA: Diagnosis not present

## 2020-11-25 DIAGNOSIS — M6258 Muscle wasting and atrophy, not elsewhere classified, other site: Secondary | ICD-10-CM | POA: Diagnosis not present

## 2020-11-25 DIAGNOSIS — Z8673 Personal history of transient ischemic attack (TIA), and cerebral infarction without residual deficits: Secondary | ICD-10-CM | POA: Diagnosis not present

## 2020-11-25 DIAGNOSIS — R2681 Unsteadiness on feet: Secondary | ICD-10-CM | POA: Diagnosis not present

## 2020-11-25 DIAGNOSIS — M6281 Muscle weakness (generalized): Secondary | ICD-10-CM | POA: Diagnosis not present

## 2020-11-25 DIAGNOSIS — R498 Other voice and resonance disorders: Secondary | ICD-10-CM | POA: Diagnosis not present

## 2020-11-25 DIAGNOSIS — R278 Other lack of coordination: Secondary | ICD-10-CM | POA: Diagnosis not present

## 2020-11-25 DIAGNOSIS — R41841 Cognitive communication deficit: Secondary | ICD-10-CM | POA: Diagnosis not present

## 2020-11-26 DIAGNOSIS — Z9181 History of falling: Secondary | ICD-10-CM | POA: Diagnosis not present

## 2020-11-26 DIAGNOSIS — R278 Other lack of coordination: Secondary | ICD-10-CM | POA: Diagnosis not present

## 2020-11-26 DIAGNOSIS — R498 Other voice and resonance disorders: Secondary | ICD-10-CM | POA: Diagnosis not present

## 2020-11-26 DIAGNOSIS — R41841 Cognitive communication deficit: Secondary | ICD-10-CM | POA: Diagnosis not present

## 2020-11-26 DIAGNOSIS — R2681 Unsteadiness on feet: Secondary | ICD-10-CM | POA: Diagnosis not present

## 2020-11-26 DIAGNOSIS — R269 Unspecified abnormalities of gait and mobility: Secondary | ICD-10-CM | POA: Diagnosis not present

## 2020-11-26 DIAGNOSIS — M6281 Muscle weakness (generalized): Secondary | ICD-10-CM | POA: Diagnosis not present

## 2020-11-26 DIAGNOSIS — M6258 Muscle wasting and atrophy, not elsewhere classified, other site: Secondary | ICD-10-CM | POA: Diagnosis not present

## 2020-11-26 DIAGNOSIS — R2689 Other abnormalities of gait and mobility: Secondary | ICD-10-CM | POA: Diagnosis not present

## 2020-11-26 DIAGNOSIS — Z8673 Personal history of transient ischemic attack (TIA), and cerebral infarction without residual deficits: Secondary | ICD-10-CM | POA: Diagnosis not present

## 2020-11-27 ENCOUNTER — Telehealth: Payer: Self-pay

## 2020-11-27 ENCOUNTER — Ambulatory Visit: Payer: Medicare Other | Admitting: Adult Health

## 2020-11-27 DIAGNOSIS — R2681 Unsteadiness on feet: Secondary | ICD-10-CM | POA: Diagnosis not present

## 2020-11-27 DIAGNOSIS — R278 Other lack of coordination: Secondary | ICD-10-CM | POA: Diagnosis not present

## 2020-11-27 DIAGNOSIS — Z9181 History of falling: Secondary | ICD-10-CM | POA: Diagnosis not present

## 2020-11-27 DIAGNOSIS — R41841 Cognitive communication deficit: Secondary | ICD-10-CM | POA: Diagnosis not present

## 2020-11-27 DIAGNOSIS — M6258 Muscle wasting and atrophy, not elsewhere classified, other site: Secondary | ICD-10-CM | POA: Diagnosis not present

## 2020-11-27 DIAGNOSIS — Z8673 Personal history of transient ischemic attack (TIA), and cerebral infarction without residual deficits: Secondary | ICD-10-CM | POA: Diagnosis not present

## 2020-11-27 DIAGNOSIS — R2689 Other abnormalities of gait and mobility: Secondary | ICD-10-CM | POA: Diagnosis not present

## 2020-11-27 DIAGNOSIS — R498 Other voice and resonance disorders: Secondary | ICD-10-CM | POA: Diagnosis not present

## 2020-11-27 DIAGNOSIS — R269 Unspecified abnormalities of gait and mobility: Secondary | ICD-10-CM | POA: Diagnosis not present

## 2020-11-27 DIAGNOSIS — M6281 Muscle weakness (generalized): Secondary | ICD-10-CM | POA: Diagnosis not present

## 2020-11-27 NOTE — Telephone Encounter (Signed)
Had an appointment with you today, but had to cancel because he is at [facility]. They will not transport him to our office without charging him a fee because they have an "in house" doctor. Wanting to know what labs you think he needs so he can have that done there.

## 2020-11-29 DIAGNOSIS — M6258 Muscle wasting and atrophy, not elsewhere classified, other site: Secondary | ICD-10-CM | POA: Diagnosis not present

## 2020-11-29 DIAGNOSIS — Z9181 History of falling: Secondary | ICD-10-CM | POA: Diagnosis not present

## 2020-11-29 DIAGNOSIS — R278 Other lack of coordination: Secondary | ICD-10-CM | POA: Diagnosis not present

## 2020-11-29 DIAGNOSIS — R2689 Other abnormalities of gait and mobility: Secondary | ICD-10-CM | POA: Diagnosis not present

## 2020-11-29 DIAGNOSIS — R41841 Cognitive communication deficit: Secondary | ICD-10-CM | POA: Diagnosis not present

## 2020-11-29 DIAGNOSIS — R498 Other voice and resonance disorders: Secondary | ICD-10-CM | POA: Diagnosis not present

## 2020-11-29 DIAGNOSIS — R2681 Unsteadiness on feet: Secondary | ICD-10-CM | POA: Diagnosis not present

## 2020-11-29 DIAGNOSIS — Z8673 Personal history of transient ischemic attack (TIA), and cerebral infarction without residual deficits: Secondary | ICD-10-CM | POA: Diagnosis not present

## 2020-11-29 DIAGNOSIS — R269 Unspecified abnormalities of gait and mobility: Secondary | ICD-10-CM | POA: Diagnosis not present

## 2020-11-29 DIAGNOSIS — M6281 Muscle weakness (generalized): Secondary | ICD-10-CM | POA: Diagnosis not present

## 2020-11-30 DIAGNOSIS — M6258 Muscle wasting and atrophy, not elsewhere classified, other site: Secondary | ICD-10-CM | POA: Diagnosis not present

## 2020-11-30 DIAGNOSIS — Z8673 Personal history of transient ischemic attack (TIA), and cerebral infarction without residual deficits: Secondary | ICD-10-CM | POA: Diagnosis not present

## 2020-11-30 DIAGNOSIS — R498 Other voice and resonance disorders: Secondary | ICD-10-CM | POA: Diagnosis not present

## 2020-11-30 DIAGNOSIS — Z9181 History of falling: Secondary | ICD-10-CM | POA: Diagnosis not present

## 2020-11-30 DIAGNOSIS — R2681 Unsteadiness on feet: Secondary | ICD-10-CM | POA: Diagnosis not present

## 2020-11-30 DIAGNOSIS — R41841 Cognitive communication deficit: Secondary | ICD-10-CM | POA: Diagnosis not present

## 2020-11-30 DIAGNOSIS — R269 Unspecified abnormalities of gait and mobility: Secondary | ICD-10-CM | POA: Diagnosis not present

## 2020-11-30 DIAGNOSIS — R278 Other lack of coordination: Secondary | ICD-10-CM | POA: Diagnosis not present

## 2020-11-30 DIAGNOSIS — M6281 Muscle weakness (generalized): Secondary | ICD-10-CM | POA: Diagnosis not present

## 2020-11-30 DIAGNOSIS — R2689 Other abnormalities of gait and mobility: Secondary | ICD-10-CM | POA: Diagnosis not present

## 2020-12-01 DIAGNOSIS — Z8673 Personal history of transient ischemic attack (TIA), and cerebral infarction without residual deficits: Secondary | ICD-10-CM | POA: Diagnosis not present

## 2020-12-01 DIAGNOSIS — R269 Unspecified abnormalities of gait and mobility: Secondary | ICD-10-CM | POA: Diagnosis not present

## 2020-12-01 DIAGNOSIS — R41841 Cognitive communication deficit: Secondary | ICD-10-CM | POA: Diagnosis not present

## 2020-12-01 DIAGNOSIS — R2681 Unsteadiness on feet: Secondary | ICD-10-CM | POA: Diagnosis not present

## 2020-12-01 DIAGNOSIS — R2689 Other abnormalities of gait and mobility: Secondary | ICD-10-CM | POA: Diagnosis not present

## 2020-12-01 DIAGNOSIS — R498 Other voice and resonance disorders: Secondary | ICD-10-CM | POA: Diagnosis not present

## 2020-12-01 DIAGNOSIS — Z9181 History of falling: Secondary | ICD-10-CM | POA: Diagnosis not present

## 2020-12-01 DIAGNOSIS — M6258 Muscle wasting and atrophy, not elsewhere classified, other site: Secondary | ICD-10-CM | POA: Diagnosis not present

## 2020-12-01 DIAGNOSIS — M6281 Muscle weakness (generalized): Secondary | ICD-10-CM | POA: Diagnosis not present

## 2020-12-01 DIAGNOSIS — R278 Other lack of coordination: Secondary | ICD-10-CM | POA: Diagnosis not present

## 2020-12-03 DIAGNOSIS — M6258 Muscle wasting and atrophy, not elsewhere classified, other site: Secondary | ICD-10-CM | POA: Diagnosis not present

## 2020-12-03 DIAGNOSIS — R278 Other lack of coordination: Secondary | ICD-10-CM | POA: Diagnosis not present

## 2020-12-03 DIAGNOSIS — R2681 Unsteadiness on feet: Secondary | ICD-10-CM | POA: Diagnosis not present

## 2020-12-03 DIAGNOSIS — R41841 Cognitive communication deficit: Secondary | ICD-10-CM | POA: Diagnosis not present

## 2020-12-03 DIAGNOSIS — R498 Other voice and resonance disorders: Secondary | ICD-10-CM | POA: Diagnosis not present

## 2020-12-03 DIAGNOSIS — R2689 Other abnormalities of gait and mobility: Secondary | ICD-10-CM | POA: Diagnosis not present

## 2020-12-03 DIAGNOSIS — R269 Unspecified abnormalities of gait and mobility: Secondary | ICD-10-CM | POA: Diagnosis not present

## 2020-12-03 DIAGNOSIS — Z8673 Personal history of transient ischemic attack (TIA), and cerebral infarction without residual deficits: Secondary | ICD-10-CM | POA: Diagnosis not present

## 2020-12-03 DIAGNOSIS — Z9181 History of falling: Secondary | ICD-10-CM | POA: Diagnosis not present

## 2020-12-03 DIAGNOSIS — M6281 Muscle weakness (generalized): Secondary | ICD-10-CM | POA: Diagnosis not present

## 2020-12-04 DIAGNOSIS — Z8673 Personal history of transient ischemic attack (TIA), and cerebral infarction without residual deficits: Secondary | ICD-10-CM | POA: Diagnosis not present

## 2020-12-04 DIAGNOSIS — R498 Other voice and resonance disorders: Secondary | ICD-10-CM | POA: Diagnosis not present

## 2020-12-04 DIAGNOSIS — M6258 Muscle wasting and atrophy, not elsewhere classified, other site: Secondary | ICD-10-CM | POA: Diagnosis not present

## 2020-12-04 DIAGNOSIS — R269 Unspecified abnormalities of gait and mobility: Secondary | ICD-10-CM | POA: Diagnosis not present

## 2020-12-04 DIAGNOSIS — R278 Other lack of coordination: Secondary | ICD-10-CM | POA: Diagnosis not present

## 2020-12-04 DIAGNOSIS — Z9181 History of falling: Secondary | ICD-10-CM | POA: Diagnosis not present

## 2020-12-04 DIAGNOSIS — R41841 Cognitive communication deficit: Secondary | ICD-10-CM | POA: Diagnosis not present

## 2020-12-04 DIAGNOSIS — M6281 Muscle weakness (generalized): Secondary | ICD-10-CM | POA: Diagnosis not present

## 2020-12-04 DIAGNOSIS — R2681 Unsteadiness on feet: Secondary | ICD-10-CM | POA: Diagnosis not present

## 2020-12-04 DIAGNOSIS — R2689 Other abnormalities of gait and mobility: Secondary | ICD-10-CM | POA: Diagnosis not present

## 2020-12-05 DIAGNOSIS — R269 Unspecified abnormalities of gait and mobility: Secondary | ICD-10-CM | POA: Diagnosis not present

## 2020-12-05 DIAGNOSIS — Z8673 Personal history of transient ischemic attack (TIA), and cerebral infarction without residual deficits: Secondary | ICD-10-CM | POA: Diagnosis not present

## 2020-12-05 DIAGNOSIS — R2681 Unsteadiness on feet: Secondary | ICD-10-CM | POA: Diagnosis not present

## 2020-12-05 DIAGNOSIS — Z889 Allergy status to unspecified drugs, medicaments and biological substances status: Secondary | ICD-10-CM | POA: Diagnosis not present

## 2020-12-05 DIAGNOSIS — M6281 Muscle weakness (generalized): Secondary | ICD-10-CM | POA: Diagnosis not present

## 2020-12-05 DIAGNOSIS — M6258 Muscle wasting and atrophy, not elsewhere classified, other site: Secondary | ICD-10-CM | POA: Diagnosis not present

## 2020-12-05 DIAGNOSIS — R498 Other voice and resonance disorders: Secondary | ICD-10-CM | POA: Diagnosis not present

## 2020-12-05 DIAGNOSIS — J3489 Other specified disorders of nose and nasal sinuses: Secondary | ICD-10-CM | POA: Diagnosis not present

## 2020-12-05 DIAGNOSIS — R41841 Cognitive communication deficit: Secondary | ICD-10-CM | POA: Diagnosis not present

## 2020-12-05 DIAGNOSIS — Z9181 History of falling: Secondary | ICD-10-CM | POA: Diagnosis not present

## 2020-12-05 DIAGNOSIS — R2689 Other abnormalities of gait and mobility: Secondary | ICD-10-CM | POA: Diagnosis not present

## 2020-12-05 DIAGNOSIS — R278 Other lack of coordination: Secondary | ICD-10-CM | POA: Diagnosis not present

## 2020-12-08 ENCOUNTER — Other Ambulatory Visit: Payer: Self-pay

## 2020-12-08 ENCOUNTER — Ambulatory Visit (INDEPENDENT_AMBULATORY_CARE_PROVIDER_SITE_OTHER): Payer: Medicare Other | Admitting: Cardiovascular Disease

## 2020-12-08 ENCOUNTER — Encounter: Payer: Self-pay | Admitting: Cardiovascular Disease

## 2020-12-08 VITALS — BP 120/69 | HR 64 | Ht 65.0 in | Wt 170.0 lb

## 2020-12-08 DIAGNOSIS — R278 Other lack of coordination: Secondary | ICD-10-CM | POA: Diagnosis not present

## 2020-12-08 DIAGNOSIS — M6258 Muscle wasting and atrophy, not elsewhere classified, other site: Secondary | ICD-10-CM | POA: Diagnosis not present

## 2020-12-08 DIAGNOSIS — M6281 Muscle weakness (generalized): Secondary | ICD-10-CM | POA: Diagnosis not present

## 2020-12-08 DIAGNOSIS — R2681 Unsteadiness on feet: Secondary | ICD-10-CM | POA: Diagnosis not present

## 2020-12-08 DIAGNOSIS — I480 Paroxysmal atrial fibrillation: Secondary | ICD-10-CM

## 2020-12-08 DIAGNOSIS — Z8673 Personal history of transient ischemic attack (TIA), and cerebral infarction without residual deficits: Secondary | ICD-10-CM | POA: Diagnosis not present

## 2020-12-08 DIAGNOSIS — Z950 Presence of cardiac pacemaker: Secondary | ICD-10-CM

## 2020-12-08 DIAGNOSIS — I25708 Atherosclerosis of coronary artery bypass graft(s), unspecified, with other forms of angina pectoris: Secondary | ICD-10-CM | POA: Diagnosis not present

## 2020-12-08 DIAGNOSIS — I441 Atrioventricular block, second degree: Secondary | ICD-10-CM

## 2020-12-08 DIAGNOSIS — I5042 Chronic combined systolic (congestive) and diastolic (congestive) heart failure: Secondary | ICD-10-CM | POA: Diagnosis not present

## 2020-12-08 DIAGNOSIS — R41841 Cognitive communication deficit: Secondary | ICD-10-CM | POA: Diagnosis not present

## 2020-12-08 DIAGNOSIS — E7800 Pure hypercholesterolemia, unspecified: Secondary | ICD-10-CM | POA: Diagnosis not present

## 2020-12-08 DIAGNOSIS — R2689 Other abnormalities of gait and mobility: Secondary | ICD-10-CM | POA: Diagnosis not present

## 2020-12-08 DIAGNOSIS — R498 Other voice and resonance disorders: Secondary | ICD-10-CM | POA: Diagnosis not present

## 2020-12-08 DIAGNOSIS — I6501 Occlusion and stenosis of right vertebral artery: Secondary | ICD-10-CM

## 2020-12-08 DIAGNOSIS — I10 Essential (primary) hypertension: Secondary | ICD-10-CM

## 2020-12-08 DIAGNOSIS — Z9181 History of falling: Secondary | ICD-10-CM | POA: Diagnosis not present

## 2020-12-08 DIAGNOSIS — R269 Unspecified abnormalities of gait and mobility: Secondary | ICD-10-CM | POA: Diagnosis not present

## 2020-12-08 NOTE — Patient Instructions (Signed)
Medication Instructions:  No changes *If you need a refill on your cardiac medications before your next appointment, please call your pharmacy*   Lab Work: None ordered If you have labs (blood work) drawn today and your tests are completely normal, you will receive your results only by: MyChart Message (if you have MyChart) OR A paper copy in the mail If you have any lab test that is abnormal or we need to change your treatment, we will call you to review the results.   Testing/Procedures: None ordered   Follow-Up: At [facility], you and your health needs are our priority.  As part of our continuing mission to provide you with exceptional heart care, we have created designated Provider Care Teams.  These Care Teams include your primary Cardiologist (physician) and Advanced Practice Providers (APPs -  Physician Assistants and Nurse Practitioners) who all work together to provide you with the care you need, when you need it.  We recommend signing up for the patient portal called "MyChart".  Sign up information is provided on this After Visit Summary.  MyChart is used to connect with patients for Virtual Visits (Telemedicine).  Patients are able to view lab/test results, encounter notes, upcoming appointments, etc.  Non-urgent messages can be sent to your provider as well.   To learn more about what you can do with MyChart, go to [email or web address].    Your next appointment:   12 month(s)  The format for your next appointment:   In Person  Provider:   Dr. Croitoru   

## 2020-12-08 NOTE — Progress Notes (Signed)
Patient ID: Paul Nap., male   DOB: [birth year], 87 y.o.   MRN: [id number]      Cardiology Office Note   Date:  12/09/2020   ID:  Paul Nap., DOB Aug 14, 1932, MRN [id number]  PCP:  Paul Pinto, MD  Cardiologist:   Paul Klein, MD   Chief Complaint  Patient presents with  . Coronary Artery Disease  . Congestive Heart Failure  . Pacemaker Check      History of Present Illness: Paul Cozort. is a 87 y.o. male who presents for follow-up on multiple cardiac problems.  His wife Paul Frazier is also my patient, but has been unable to follow-up in a long time.  After suffering years of slow and steady decline, with worsening functional status and many falls, then has had a drastic reduction in functional ability following a prolonged hospitalization for COVID-19 pneumonia in early February.  Since then he has been a resident at [facility], just 1 door down from his wife Paul Frazier who has been a resident there since her stroke several years ago.  He is now essentially confined to the wheelchair, except when he has supervised PT.  He did have a fall in April complicated by head injury. He can stand, but he "had his walker taken away from him" because he was prone to falls.   On the other hand his cardiac complaints have been very well compensated.    He has not had any problems with angina, dyspnea on exertion, orthopnea, PND or lower extremity edema.  Since he was having problems with essential tremor, his metoprolol was stopped and he is now on propranolol which has been titrated up to a dose of 120 mg a day, with good impact on the tremor.  He has not had any bleeding problems, but bruises easily.  He was seen in the emergency room after a fall when he "lost his balance" and had a bump to the right upper forehead region, but CT of the head did not show any significant problems.  His memory has deteriorated.  He cannot remember what he ate the day before.  He sometimes does not  recall conversations he had earlier in that day.  He visits with Paul Frazier in the neighboring room frequently, but conversations with her becoming increasingly difficult since she has her own issues with memory and confusion.  His son Paul Frazier takes him to visit his home once a week when they take care of his financial bills.  He is planning to sell the house.  His blood pressure is well controlled, per records from his nursing facility.  Pacemaker interrogation shows normal device function.  His Medtronic as you are device implanted in 2019 has 5.6 years of remaining longevity.  He had a week of persistent atrial fibrillation during his hospitalization with COVID-19 pneumonia, but since then the burden of atrial fibrillation has returned to the baseline, very infrequent.  He has 73.7% atrial pacing, 3.4% atrial fibrillation, 97.4% ventricular pacing.  He has a dual-chamber pacemaker and his right ventricular lead is a His bundle/septal location.  His current generator was implanted in 2019 and has approximately 7 years of remaining longevity.  Lead parameters are good.  He has 81% atrial pacing and 99% ventricular pacing.  Underlying rhythm is atrial sensed, ventricular sensed (probably sinus bradycardia?) at about 40 bpm, with a long AV delay.  He has had infrequent episodes of atrial fibrillation, overall burden less than 0.1% (2 hours in March,  31 minutes on July 9).  He has not had any problems with rapid ventricular rates during atrial fibrillation, but has had very rare episodes of nonsustained VT (longest 6 beats on July 17).  Activity level remains fairly good at about 2 hours/day.  He has a history of coronary artery disease and previous bypass surgery with subsequent graft failure and myocardial infarction, moderate to severely depressed left ventricular systolic function with EF around 30% and well compensated heart failure, nonsustained ventricular tachycardia, atrial flutter status post  radiofrequency ablation, paroxysmal atrial fibrillation, left bundle branch block, second-degree atrioventricular block, pacemaker with septal His bundle ventricular lead (March 09, 2018, Dr. Lovena Le, Medtronic Azure), history of subdural hematoma in 2013.  He had atrial flutter ablation, followed by development of complete heart block.  He underwent implantation of a dual-chamber permanent pacemaker with a His bundle septal ventricular lead on March 09, 2018.   Ventricular outputs were reduced to chronic settings today.  His estimated generator longevity after these changes is now 8.8 years.  Prior to his RF ablation he underwent a transesophageal echo on August 21 which showed left ventricular ejection fraction of 30-35% with inferior and inferoseptal akinesis and the mildly dilated left atrium, mild mitral regurgitation, moderate tricuspid regurgitation.  A nuclear stress test performed on April 12, 2018 shows a similar pattern of LVEF 30% with inferior scar, incomplete reversible ischemia.  He has a complicated history of coronary disease and underwent bypass surgery in 1986. He has total occlusion of his LAD artery and his dominant left circumflex coronary artery is graft dependent. He presented with a moderate size non-ST segment elevation myocardial infarction in the setting of paroxysmal supraventricular tachycardia in October 2012 and was treated in [place], [place]. The catheterization report is a little confusing since it describes grafts with different locations than those described at the time of his bypass procedure, however it appears that he has lost the sequential bypass to the oblique marginal artery. A more recent nuclear stress test (May 2014) showed that his left ventricular ejection fraction was down to 42% with inferolateral and anterolateral scar and hypokinesia but without any reversible ischemic abnormalities. I offered cardiac catheterization at that time since I was  worried that he had developed disease in the sequential saphenous vein graft to the PDA/PLA, but he preferred conservative management.  A nuclear stress test in September 2019 shows basal-mid inferior, mostly fixed defect, EF 30%.  He underwent radiofrequency ablation for atrial flutter in August [id number], complicated by complete heart block and leading to implantation of a dual-chamber permanent pacemaker with a His bundle lead (Medtronic Azure).  He had long-standing left bundle branch block before that.   Past Medical History:  Diagnosis Date  . Arthritis   . CAD (coronary artery disease)   . Cancer ([place])   . Hyperlipidemia   . Hypertension   . Kidney stones   . Old myocardial infarct   . Right inguinal hernia 04/19/2012  . S/P CABG x 5 08/29/1984   LIMA to LAD,SVG to diagonal,SVG to OM,SVG to PDA and PLA  . Tremor, essential     Past Surgical History:  Procedure Laterality Date  . A-FLUTTER ABLATION N/A 03/09/2018   Procedure: A-FLUTTER ABLATION;  Surgeon: Evans Lance, MD;  Location: [facility];  Service: Cardiovascular;  Laterality: N/A;  . CHOLECYSTECTOMY  10/1990  . CORONARY ARTERY BYPASS GRAFT  1986   x5 LIMA to LAD,SVG to diagonal,SVG to OM,SVG to PDA and PLA  .  CORONARY STENT PLACEMENT    . INGUINAL HERNIA REPAIR  04/25/2012   right- laparoscopic  . PACEMAKER IMPLANT N/A 03/09/2018   Procedure: PACEMAKER IMPLANT;  Surgeon: Evans Lance, MD;  Location: [facility];  Service: Cardiovascular;  Laterality: N/A;  . R/P Myoview  12/27/2008   no ischemia  . TEE WITHOUT CARDIOVERSION N/A 03/09/2018   Procedure: TRANSESOPHAGEAL ECHOCARDIOGRAM (TEE);  Surgeon: Fay Records, MD;  Location: [facility] ENDOS[facility];  Service: Cardiovascular;  Laterality: N/A;  Bubble Study  . US ECHOCARDIOGRAPHY  12/27/2008   concentric LVH,mild to mod MR, TR and pulmonary hypertension. EF 45-50%     Current Outpatient Medications  Medication Sig Dispense Refill  . atorvastatin (LIPITOR) 40 MG  tablet Take 1 tablet daily for Cholesterol (Patient taking differently: Take 40 mg by mouth daily. for Cholesterol) 90 tablet 3  . Azelastine HCl 137 MCG/SPRAY SOLN Place into both nostrils.    [name] buPROPion (WELLBUTRIN SR) 200 MG 12 hr tablet Take 200 mg by mouth every morning.    [name] buPROPion (WELLBUTRIN XL) 300 MG 24 hr tablet Take 1 tablet daily for Mood (Patient taking differently: Take 300 mg by mouth daily. for Mood) 90 tablet 3  . busPIRone (BUSPAR) 5 MG tablet Take 5 mg by mouth 2 (two) times daily.    . Cholecalciferol (VITAMIN D) 125 MCG (5000 UT) CAPS Take 5,000 Units by mouth daily.    [name] ELIQUIS 5 MG TABS tablet TAKE 1 TABLET BY MOUTH  TWICE DAILY (Patient taking differently: Take 5 mg by mouth 2 (two) times daily.) 180 tablet 3  . gabapentin (NEURONTIN) 300 MG capsule TAKE 1 CAPSULE BY MOUTH 3  TIMES DAILY AS NEEDED FOR  CHRONIC PAIN (Patient taking differently: Take 300 mg by mouth 3 (three) times daily as needed (chronic pain).) 270 capsule 3  . methocarbamol (ROBAXIN) 500 MG tablet Take by mouth daily as needed.    . nitroGLYCERIN (NITROSTAT) 0.4 MG SL tablet Place 1 tablet (0.4 mg total) under the tongue every 5 (five) minutes as needed for chest pain. 25 tablet 3  . omeprazole (PRILOSEC OTC) 20 MG tablet Take 20 mg by mouth daily as needed (acid reflux).    [name] OVER THE COUNTER MEDICATION Take 2 capsules by mouth daily. Omega Q with resveratrol and turmeric    . potassium chloride SA (KLOR-CON) 20 MEQ tablet Take 40 mEq by mouth daily.    . propranolol ER (INDERAL LA) 120 MG 24 hr capsule Take 120 mg by mouth daily.    . citalopram (CELEXA) 40 MG tablet Take 1 tablet (40 mg total) by mouth daily. 90 tablet 2  . losartan (COZAAR) 25 MG tablet Take 0.5 tablets (12.5 mg total) by mouth daily. 45 tablet 3   No current facility-administered medications for this visit.    Allergies:   Erythromycin and Fluorouracil    Social History:  The patient  reports that he quit smoking about 50  years ago. He has never used smokeless tobacco. He reports that he does not drink alcohol and does not use drugs.   Family History:  The patient's family history includes Cancer in his brother and father.    ROS:  Please see the history of present illness.    All other systems are reviewed and are negative.   PHYSICAL EXAM: VS:  BP 120/69   Pulse 64   Ht 5\' 5"  (1.651 m)   Wt 170 lb (77.1 kg)   SpO2 97%  BMI 28.29 kg/m  , BMI Body mass index is 28.29 kg/m.  General: Alert, oriented x3, no distress, appears elderly and frail.  Healthy pacemaker site. Head: no evidence of trauma, PERRL, EOMI, no exophtalmos or lid lag, no myxedema, no xanthelasma; normal ears, nose and oropharynx Neck: normal jugular venous pulsations and no hepatojugular reflux; brisk carotid pulses without delay and no carotid bruits Chest: clear to auscultation, no signs of consolidation by percussion or palpation, normal fremitus, symmetrical and full respiratory excursions Cardiovascular: normal position and quality of the apical impulse, regular rhythm, normal first and second heart sounds, no murmurs, rubs or gallops Abdomen: no tenderness or distention, no masses by palpation, no abnormal pulsatility or arterial bruits, normal bowel sounds, no hepatosplenomegaly Extremities: no clubbing, cyanosis or edema; 2+ radial, ulnar and brachial pulses bilaterally; 2+ right femoral, posterior tibial and dorsalis pedis pulses; 2+ left femoral, posterior tibial and dorsalis pedis pulses; no subclavian or femoral bruits Neurological: grossly nonfocal Psych: Normal mood and affect   EKG:  EKG is not ordered today.  The intracardiac electrogram shows AV sequential pacing.  The ECG from 08/23/2020 shows atrial sensed/atrial paced beats with ventricular pacing.  The paced QRS is relatively narrow, consistent with His bundle pacing.  Recent Labs: 05/28/2020: TSH 0.81 08/28/2020: ALT 44; BUN 25; Creatinine, Ser 0.99; Hemoglobin  15.6; Magnesium 2.1; Platelets 252; Potassium 4.6; Sodium 135    Lipid Panel    Component Value Date/Time   CHOL 149 05/28/2020 1546   CHOL 117 08/21/2018 0943   TRIG 110 08/23/2020 1511   HDL 57 05/28/2020 1546   HDL 54 08/21/2018 0943   CHOLHDL 2.6 05/28/2020 1546   VLDL 12 03/07/2018 0459   LDLCALC 68 05/28/2020 1546      Wt Readings from Last 3 Encounters:  12/08/20 170 lb (77.1 kg)  10/29/20 163 lb (73.9 kg)  08/24/20 173 lb 1 oz (78.5 kg)      ASSESSMENT AND PLAN:  1. Chronic combined systolic and diastolic heart failure ([place])   2. Coronary artery disease of bypass graft of native heart with stable angina pectoris (HCC)   3. Paroxysmal atrial fibrillation ([place])   4. Hypercholesterolemia   5. Second degree atrioventricular block   6. Pacemaker   7. 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 Essential hypertension     1. CHF: Well compensated, but functional status is hard to assess due to his limited physical mobility.  Appears clinically euvolemic on exam.  Avoid excessive diuresis and unable to titrate heart failure medications at higher due to orthostatic hypotension.  He is currently not on loop diuretics, but takes an ARB and we should probably discontinue his potassium supplement. 2. CAD s/p CABG: He is not having angina pectoris, again possibly due to his low level of activity.  Most recent imaging studies suggest that he may indeed have completely lost vascular supply to the inferior wall due to occlusion of the SVG to the PDA/PLA.   3. AFib: On Eliquis.  Overall burden of arrhythmia is low.  CHA2DS2-VASc 5 (age 42, CAD/PAD, CHF, HTN).  Since his falls have diminished in frequency would continue Eliquis for now, but may need to discontinue it if he has further injuries. 4. HLP: On statin, recent LDL cholesterol 68. 5. 2nd/3rd deg AVB: At very slow rates (40 bpm) he has 1: 1 AV conduction, but rates over 50 bpm he requires 100% ventricular pacing.   6.  PPM:  septal His bundle ventricular  lead.  Normal device function.  Continue remote downloads every 3 months. 7.  Occluded right vertebral artery, chronic: He does not have any acute focal neurological complaints.  His memory is deteriorating, likely due to a combination of vascular dementia/repeat orthostatic hypotension, cannot exclude a component of Alzheimer's disease. 8. HTN: Blood pressure control is good.  Avoid more aggressive blood pressure control since this may increase his risk of falls.  Tolerate systolic blood pressure up to 140 mmHg.   No orders of the defined types were placed in this encounter.   Patient Instructions  Medication Instructions:  No changes *If you need a refill on your cardiac medications before your next appointment, please call your pharmacy*   Lab Work: None ordered If you have labs (blood work) drawn today and your tests are completely normal, you will receive your results only by: [name] MyChart Message (if you have MyChart) OR . A paper copy in the mail If you have any lab test that is abnormal or we need to change your treatment, we will call you to review the results.   Testing/Procedures: None ordered   Follow-Up: At [facility], you and your health needs are our priority.  As part of our continuing mission to provide you with exceptional heart care, we have created designated Provider Care Teams.  These Care Teams include your primary Cardiologist (physician) and Advanced Practice Providers (APPs -  Physician Assistants and Nurse Practitioners) who all work together to provide you with the care you need, when you need it.  We recommend signing up for the patient portal called "MyChart".  Sign up information is provided on this After Visit Summary.  MyChart is used to connect with patients for Virtual Visits (Telemedicine).  Patients are able to view lab/test results, encounter notes, upcoming appointments, etc.  Non-urgent messages can be sent to  your provider as well.   To learn more about what you can do with MyChart, go to [email or web address].    Your next appointment:   12 month(s)  The format for your next appointment:   In Person  Provider:   Dr. Sallyanne Kuster    Signed, Paul Klein, MD  12/09/2020 8:39 AM    Paul Klein, MD, [facility] [phone] office [phone] pager

## 2020-12-09 ENCOUNTER — Encounter: Payer: Self-pay | Admitting: Cardiovascular Disease

## 2020-12-09 DIAGNOSIS — R269 Unspecified abnormalities of gait and mobility: Secondary | ICD-10-CM | POA: Diagnosis not present

## 2020-12-09 DIAGNOSIS — M6281 Muscle weakness (generalized): Secondary | ICD-10-CM | POA: Diagnosis not present

## 2020-12-09 DIAGNOSIS — M6258 Muscle wasting and atrophy, not elsewhere classified, other site: Secondary | ICD-10-CM | POA: Diagnosis not present

## 2020-12-09 DIAGNOSIS — R2681 Unsteadiness on feet: Secondary | ICD-10-CM | POA: Diagnosis not present

## 2020-12-09 DIAGNOSIS — R2689 Other abnormalities of gait and mobility: Secondary | ICD-10-CM | POA: Diagnosis not present

## 2020-12-09 DIAGNOSIS — R41841 Cognitive communication deficit: Secondary | ICD-10-CM | POA: Diagnosis not present

## 2020-12-09 DIAGNOSIS — Z9181 History of falling: Secondary | ICD-10-CM | POA: Diagnosis not present

## 2020-12-09 DIAGNOSIS — Z8673 Personal history of transient ischemic attack (TIA), and cerebral infarction without residual deficits: Secondary | ICD-10-CM | POA: Diagnosis not present

## 2020-12-09 DIAGNOSIS — R278 Other lack of coordination: Secondary | ICD-10-CM | POA: Diagnosis not present

## 2020-12-09 DIAGNOSIS — R498 Other voice and resonance disorders: Secondary | ICD-10-CM | POA: Diagnosis not present

## 2020-12-10 DIAGNOSIS — R278 Other lack of coordination: Secondary | ICD-10-CM | POA: Diagnosis not present

## 2020-12-10 DIAGNOSIS — R269 Unspecified abnormalities of gait and mobility: Secondary | ICD-10-CM | POA: Diagnosis not present

## 2020-12-10 DIAGNOSIS — M6258 Muscle wasting and atrophy, not elsewhere classified, other site: Secondary | ICD-10-CM | POA: Diagnosis not present

## 2020-12-10 DIAGNOSIS — Z8673 Personal history of transient ischemic attack (TIA), and cerebral infarction without residual deficits: Secondary | ICD-10-CM | POA: Diagnosis not present

## 2020-12-10 DIAGNOSIS — M6281 Muscle weakness (generalized): Secondary | ICD-10-CM | POA: Diagnosis not present

## 2020-12-10 DIAGNOSIS — R41841 Cognitive communication deficit: Secondary | ICD-10-CM | POA: Diagnosis not present

## 2020-12-10 DIAGNOSIS — R498 Other voice and resonance disorders: Secondary | ICD-10-CM | POA: Diagnosis not present

## 2020-12-10 DIAGNOSIS — R2681 Unsteadiness on feet: Secondary | ICD-10-CM | POA: Diagnosis not present

## 2020-12-10 DIAGNOSIS — R2689 Other abnormalities of gait and mobility: Secondary | ICD-10-CM | POA: Diagnosis not present

## 2020-12-10 DIAGNOSIS — Z9181 History of falling: Secondary | ICD-10-CM | POA: Diagnosis not present

## 2020-12-23 DIAGNOSIS — R0981 Nasal congestion: Secondary | ICD-10-CM | POA: Diagnosis not present

## 2020-12-23 DIAGNOSIS — J3489 Other specified disorders of nose and nasal sinuses: Secondary | ICD-10-CM | POA: Diagnosis not present

## 2020-12-23 DIAGNOSIS — Z889 Allergy status to unspecified drugs, medicaments and biological substances status: Secondary | ICD-10-CM | POA: Diagnosis not present

## 2020-12-24 ENCOUNTER — Ambulatory Visit (INDEPENDENT_AMBULATORY_CARE_PROVIDER_SITE_OTHER): Payer: Medicare Other

## 2020-12-24 DIAGNOSIS — I442 Atrioventricular block, complete: Secondary | ICD-10-CM

## 2020-12-24 LAB — CUP PACEART REMOTE DEVICE CHECK
Battery Remaining Longevity: 68 mo
Battery Voltage: 2.96 V
Brady Statistic AP VP Percent: 91.42 %
Brady Statistic AP VS Percent: 0.15 %
Brady Statistic AS VP Percent: 7.65 %
Brady Statistic AS VS Percent: 0.78 %
Brady Statistic RA Percent Paced: 92.03 %
Brady Statistic RV Percent Paced: 99.07 %
Date Time Interrogation Session: 20220607150401
Implantable Lead Implant Date: 20190822
Implantable Lead Implant Date: 20190822
Implantable Lead Location: 753859
Implantable Lead Location: 753860
Implantable Lead Model: 3830
Implantable Lead Model: 5076
Implantable Pulse Generator Implant Date: 20190822
Lead Channel Impedance Value: 304 Ohm
Lead Channel Impedance Value: 323 Ohm
Lead Channel Impedance Value: 380 Ohm
Lead Channel Impedance Value: 494 Ohm
Lead Channel Pacing Threshold Amplitude: 0.625 V
Lead Channel Pacing Threshold Amplitude: 1.75 V
Lead Channel Pacing Threshold Pulse Width: 0.4 ms
Lead Channel Pacing Threshold Pulse Width: 0.4 ms
Lead Channel Sensing Intrinsic Amplitude: 5.25 mV
Lead Channel Sensing Intrinsic Amplitude: 5.25 mV
Lead Channel Sensing Intrinsic Amplitude: 6 mV
Lead Channel Sensing Intrinsic Amplitude: 6 mV
Lead Channel Setting Pacing Amplitude: 1.5 V
Lead Channel Setting Pacing Amplitude: 2.5 V
Lead Channel Setting Pacing Pulse Width: 1 ms
Lead Channel Setting Sensing Sensitivity: 2.8 mV

## 2021-01-01 DIAGNOSIS — R498 Other voice and resonance disorders: Secondary | ICD-10-CM | POA: Diagnosis not present

## 2021-01-01 DIAGNOSIS — M25561 Pain in right knee: Secondary | ICD-10-CM | POA: Diagnosis not present

## 2021-01-01 DIAGNOSIS — Z8673 Personal history of transient ischemic attack (TIA), and cerebral infarction without residual deficits: Secondary | ICD-10-CM | POA: Diagnosis not present

## 2021-01-01 DIAGNOSIS — R41841 Cognitive communication deficit: Secondary | ICD-10-CM | POA: Diagnosis not present

## 2021-01-01 DIAGNOSIS — R2689 Other abnormalities of gait and mobility: Secondary | ICD-10-CM | POA: Diagnosis not present

## 2021-01-01 DIAGNOSIS — R2681 Unsteadiness on feet: Secondary | ICD-10-CM | POA: Diagnosis not present

## 2021-01-01 DIAGNOSIS — Z9181 History of falling: Secondary | ICD-10-CM | POA: Diagnosis not present

## 2021-01-01 DIAGNOSIS — M6258 Muscle wasting and atrophy, not elsewhere classified, other site: Secondary | ICD-10-CM | POA: Diagnosis not present

## 2021-01-01 DIAGNOSIS — M6281 Muscle weakness (generalized): Secondary | ICD-10-CM | POA: Diagnosis not present

## 2021-01-01 DIAGNOSIS — M25551 Pain in right hip: Secondary | ICD-10-CM | POA: Diagnosis not present

## 2021-01-01 DIAGNOSIS — M1990 Unspecified osteoarthritis, unspecified site: Secondary | ICD-10-CM | POA: Diagnosis not present

## 2021-01-01 DIAGNOSIS — R278 Other lack of coordination: Secondary | ICD-10-CM | POA: Diagnosis not present

## 2021-01-01 DIAGNOSIS — M79651 Pain in right thigh: Secondary | ICD-10-CM | POA: Diagnosis not present

## 2021-01-01 DIAGNOSIS — M79661 Pain in right lower leg: Secondary | ICD-10-CM | POA: Diagnosis not present

## 2021-01-01 DIAGNOSIS — W1789XA Other fall from one level to another, initial encounter: Secondary | ICD-10-CM | POA: Diagnosis not present

## 2021-01-01 DIAGNOSIS — R269 Unspecified abnormalities of gait and mobility: Secondary | ICD-10-CM | POA: Diagnosis not present

## 2021-01-02 DIAGNOSIS — M6281 Muscle weakness (generalized): Secondary | ICD-10-CM | POA: Diagnosis not present

## 2021-01-02 DIAGNOSIS — R2681 Unsteadiness on feet: Secondary | ICD-10-CM | POA: Diagnosis not present

## 2021-01-02 DIAGNOSIS — R278 Other lack of coordination: Secondary | ICD-10-CM | POA: Diagnosis not present

## 2021-01-02 DIAGNOSIS — Z8673 Personal history of transient ischemic attack (TIA), and cerebral infarction without residual deficits: Secondary | ICD-10-CM | POA: Diagnosis not present

## 2021-01-02 DIAGNOSIS — R269 Unspecified abnormalities of gait and mobility: Secondary | ICD-10-CM | POA: Diagnosis not present

## 2021-01-02 DIAGNOSIS — Z9181 History of falling: Secondary | ICD-10-CM | POA: Diagnosis not present

## 2021-01-02 DIAGNOSIS — M6258 Muscle wasting and atrophy, not elsewhere classified, other site: Secondary | ICD-10-CM | POA: Diagnosis not present

## 2021-01-02 DIAGNOSIS — R498 Other voice and resonance disorders: Secondary | ICD-10-CM | POA: Diagnosis not present

## 2021-01-02 DIAGNOSIS — M25561 Pain in right knee: Secondary | ICD-10-CM | POA: Diagnosis not present

## 2021-01-02 DIAGNOSIS — R41841 Cognitive communication deficit: Secondary | ICD-10-CM | POA: Diagnosis not present

## 2021-01-02 DIAGNOSIS — M1990 Unspecified osteoarthritis, unspecified site: Secondary | ICD-10-CM | POA: Diagnosis not present

## 2021-01-02 DIAGNOSIS — R2689 Other abnormalities of gait and mobility: Secondary | ICD-10-CM | POA: Diagnosis not present

## 2021-01-05 DIAGNOSIS — R269 Unspecified abnormalities of gait and mobility: Secondary | ICD-10-CM | POA: Diagnosis not present

## 2021-01-05 DIAGNOSIS — Z8673 Personal history of transient ischemic attack (TIA), and cerebral infarction without residual deficits: Secondary | ICD-10-CM | POA: Diagnosis not present

## 2021-01-05 DIAGNOSIS — M1990 Unspecified osteoarthritis, unspecified site: Secondary | ICD-10-CM | POA: Diagnosis not present

## 2021-01-05 DIAGNOSIS — M25561 Pain in right knee: Secondary | ICD-10-CM | POA: Diagnosis not present

## 2021-01-05 DIAGNOSIS — R278 Other lack of coordination: Secondary | ICD-10-CM | POA: Diagnosis not present

## 2021-01-05 DIAGNOSIS — R2681 Unsteadiness on feet: Secondary | ICD-10-CM | POA: Diagnosis not present

## 2021-01-05 DIAGNOSIS — Z9181 History of falling: Secondary | ICD-10-CM | POA: Diagnosis not present

## 2021-01-05 DIAGNOSIS — R498 Other voice and resonance disorders: Secondary | ICD-10-CM | POA: Diagnosis not present

## 2021-01-05 DIAGNOSIS — M6258 Muscle wasting and atrophy, not elsewhere classified, other site: Secondary | ICD-10-CM | POA: Diagnosis not present

## 2021-01-05 DIAGNOSIS — R2689 Other abnormalities of gait and mobility: Secondary | ICD-10-CM | POA: Diagnosis not present

## 2021-01-05 DIAGNOSIS — R41841 Cognitive communication deficit: Secondary | ICD-10-CM | POA: Diagnosis not present

## 2021-01-05 DIAGNOSIS — M6281 Muscle weakness (generalized): Secondary | ICD-10-CM | POA: Diagnosis not present

## 2021-01-06 DIAGNOSIS — R41841 Cognitive communication deficit: Secondary | ICD-10-CM | POA: Diagnosis not present

## 2021-01-06 DIAGNOSIS — M6281 Muscle weakness (generalized): Secondary | ICD-10-CM | POA: Diagnosis not present

## 2021-01-06 DIAGNOSIS — R2681 Unsteadiness on feet: Secondary | ICD-10-CM | POA: Diagnosis not present

## 2021-01-06 DIAGNOSIS — M6258 Muscle wasting and atrophy, not elsewhere classified, other site: Secondary | ICD-10-CM | POA: Diagnosis not present

## 2021-01-06 DIAGNOSIS — R2689 Other abnormalities of gait and mobility: Secondary | ICD-10-CM | POA: Diagnosis not present

## 2021-01-06 DIAGNOSIS — Z8673 Personal history of transient ischemic attack (TIA), and cerebral infarction without residual deficits: Secondary | ICD-10-CM | POA: Diagnosis not present

## 2021-01-06 DIAGNOSIS — R269 Unspecified abnormalities of gait and mobility: Secondary | ICD-10-CM | POA: Diagnosis not present

## 2021-01-06 DIAGNOSIS — M1990 Unspecified osteoarthritis, unspecified site: Secondary | ICD-10-CM | POA: Diagnosis not present

## 2021-01-06 DIAGNOSIS — R278 Other lack of coordination: Secondary | ICD-10-CM | POA: Diagnosis not present

## 2021-01-06 DIAGNOSIS — M25561 Pain in right knee: Secondary | ICD-10-CM | POA: Diagnosis not present

## 2021-01-06 DIAGNOSIS — R498 Other voice and resonance disorders: Secondary | ICD-10-CM | POA: Diagnosis not present

## 2021-01-06 DIAGNOSIS — Z9181 History of falling: Secondary | ICD-10-CM | POA: Diagnosis not present

## 2021-01-07 DIAGNOSIS — R2689 Other abnormalities of gait and mobility: Secondary | ICD-10-CM | POA: Diagnosis not present

## 2021-01-07 DIAGNOSIS — R269 Unspecified abnormalities of gait and mobility: Secondary | ICD-10-CM | POA: Diagnosis not present

## 2021-01-07 DIAGNOSIS — M6281 Muscle weakness (generalized): Secondary | ICD-10-CM | POA: Diagnosis not present

## 2021-01-07 DIAGNOSIS — R278 Other lack of coordination: Secondary | ICD-10-CM | POA: Diagnosis not present

## 2021-01-07 DIAGNOSIS — Z8673 Personal history of transient ischemic attack (TIA), and cerebral infarction without residual deficits: Secondary | ICD-10-CM | POA: Diagnosis not present

## 2021-01-07 DIAGNOSIS — M6258 Muscle wasting and atrophy, not elsewhere classified, other site: Secondary | ICD-10-CM | POA: Diagnosis not present

## 2021-01-07 DIAGNOSIS — R498 Other voice and resonance disorders: Secondary | ICD-10-CM | POA: Diagnosis not present

## 2021-01-07 DIAGNOSIS — R2681 Unsteadiness on feet: Secondary | ICD-10-CM | POA: Diagnosis not present

## 2021-01-07 DIAGNOSIS — Z9181 History of falling: Secondary | ICD-10-CM | POA: Diagnosis not present

## 2021-01-07 DIAGNOSIS — R41841 Cognitive communication deficit: Secondary | ICD-10-CM | POA: Diagnosis not present

## 2021-01-07 DIAGNOSIS — M25561 Pain in right knee: Secondary | ICD-10-CM | POA: Diagnosis not present

## 2021-01-07 DIAGNOSIS — M1990 Unspecified osteoarthritis, unspecified site: Secondary | ICD-10-CM | POA: Diagnosis not present

## 2021-01-08 DIAGNOSIS — M1990 Unspecified osteoarthritis, unspecified site: Secondary | ICD-10-CM | POA: Diagnosis not present

## 2021-01-08 DIAGNOSIS — R2689 Other abnormalities of gait and mobility: Secondary | ICD-10-CM | POA: Diagnosis not present

## 2021-01-08 DIAGNOSIS — Z9181 History of falling: Secondary | ICD-10-CM | POA: Diagnosis not present

## 2021-01-08 DIAGNOSIS — R278 Other lack of coordination: Secondary | ICD-10-CM | POA: Diagnosis not present

## 2021-01-08 DIAGNOSIS — R498 Other voice and resonance disorders: Secondary | ICD-10-CM | POA: Diagnosis not present

## 2021-01-08 DIAGNOSIS — I5040 Unspecified combined systolic (congestive) and diastolic (congestive) heart failure: Secondary | ICD-10-CM | POA: Diagnosis not present

## 2021-01-08 DIAGNOSIS — I480 Paroxysmal atrial fibrillation: Secondary | ICD-10-CM | POA: Diagnosis not present

## 2021-01-08 DIAGNOSIS — R2681 Unsteadiness on feet: Secondary | ICD-10-CM | POA: Diagnosis not present

## 2021-01-08 DIAGNOSIS — R269 Unspecified abnormalities of gait and mobility: Secondary | ICD-10-CM | POA: Diagnosis not present

## 2021-01-08 DIAGNOSIS — M6281 Muscle weakness (generalized): Secondary | ICD-10-CM | POA: Diagnosis not present

## 2021-01-08 DIAGNOSIS — M25561 Pain in right knee: Secondary | ICD-10-CM | POA: Diagnosis not present

## 2021-01-08 DIAGNOSIS — M6258 Muscle wasting and atrophy, not elsewhere classified, other site: Secondary | ICD-10-CM | POA: Diagnosis not present

## 2021-01-08 DIAGNOSIS — I110 Hypertensive heart disease with heart failure: Secondary | ICD-10-CM | POA: Diagnosis not present

## 2021-01-08 DIAGNOSIS — R41841 Cognitive communication deficit: Secondary | ICD-10-CM | POA: Diagnosis not present

## 2021-01-08 DIAGNOSIS — Z8673 Personal history of transient ischemic attack (TIA), and cerebral infarction without residual deficits: Secondary | ICD-10-CM | POA: Diagnosis not present

## 2021-01-09 DIAGNOSIS — M6281 Muscle weakness (generalized): Secondary | ICD-10-CM | POA: Diagnosis not present

## 2021-01-09 DIAGNOSIS — Z9181 History of falling: Secondary | ICD-10-CM | POA: Diagnosis not present

## 2021-01-09 DIAGNOSIS — R269 Unspecified abnormalities of gait and mobility: Secondary | ICD-10-CM | POA: Diagnosis not present

## 2021-01-09 DIAGNOSIS — R498 Other voice and resonance disorders: Secondary | ICD-10-CM | POA: Diagnosis not present

## 2021-01-09 DIAGNOSIS — R2681 Unsteadiness on feet: Secondary | ICD-10-CM | POA: Diagnosis not present

## 2021-01-09 DIAGNOSIS — R41841 Cognitive communication deficit: Secondary | ICD-10-CM | POA: Diagnosis not present

## 2021-01-09 DIAGNOSIS — R278 Other lack of coordination: Secondary | ICD-10-CM | POA: Diagnosis not present

## 2021-01-09 DIAGNOSIS — Z8673 Personal history of transient ischemic attack (TIA), and cerebral infarction without residual deficits: Secondary | ICD-10-CM | POA: Diagnosis not present

## 2021-01-09 DIAGNOSIS — M6258 Muscle wasting and atrophy, not elsewhere classified, other site: Secondary | ICD-10-CM | POA: Diagnosis not present

## 2021-01-09 DIAGNOSIS — R2689 Other abnormalities of gait and mobility: Secondary | ICD-10-CM | POA: Diagnosis not present

## 2021-01-09 DIAGNOSIS — M1990 Unspecified osteoarthritis, unspecified site: Secondary | ICD-10-CM | POA: Diagnosis not present

## 2021-01-09 DIAGNOSIS — M25561 Pain in right knee: Secondary | ICD-10-CM | POA: Diagnosis not present

## 2021-01-10 DIAGNOSIS — R2681 Unsteadiness on feet: Secondary | ICD-10-CM | POA: Diagnosis not present

## 2021-01-10 DIAGNOSIS — M6281 Muscle weakness (generalized): Secondary | ICD-10-CM | POA: Diagnosis not present

## 2021-01-10 DIAGNOSIS — R2689 Other abnormalities of gait and mobility: Secondary | ICD-10-CM | POA: Diagnosis not present

## 2021-01-10 DIAGNOSIS — R498 Other voice and resonance disorders: Secondary | ICD-10-CM | POA: Diagnosis not present

## 2021-01-10 DIAGNOSIS — M6258 Muscle wasting and atrophy, not elsewhere classified, other site: Secondary | ICD-10-CM | POA: Diagnosis not present

## 2021-01-10 DIAGNOSIS — M1990 Unspecified osteoarthritis, unspecified site: Secondary | ICD-10-CM | POA: Diagnosis not present

## 2021-01-10 DIAGNOSIS — Z13228 Encounter for screening for other metabolic disorders: Secondary | ICD-10-CM | POA: Diagnosis not present

## 2021-01-10 DIAGNOSIS — Z9181 History of falling: Secondary | ICD-10-CM | POA: Diagnosis not present

## 2021-01-10 DIAGNOSIS — R41841 Cognitive communication deficit: Secondary | ICD-10-CM | POA: Diagnosis not present

## 2021-01-10 DIAGNOSIS — R269 Unspecified abnormalities of gait and mobility: Secondary | ICD-10-CM | POA: Diagnosis not present

## 2021-01-10 DIAGNOSIS — R278 Other lack of coordination: Secondary | ICD-10-CM | POA: Diagnosis not present

## 2021-01-10 DIAGNOSIS — Z8673 Personal history of transient ischemic attack (TIA), and cerebral infarction without residual deficits: Secondary | ICD-10-CM | POA: Diagnosis not present

## 2021-01-10 DIAGNOSIS — M25561 Pain in right knee: Secondary | ICD-10-CM | POA: Diagnosis not present

## 2021-01-12 DIAGNOSIS — M6281 Muscle weakness (generalized): Secondary | ICD-10-CM | POA: Diagnosis not present

## 2021-01-12 DIAGNOSIS — M6258 Muscle wasting and atrophy, not elsewhere classified, other site: Secondary | ICD-10-CM | POA: Diagnosis not present

## 2021-01-12 DIAGNOSIS — M25561 Pain in right knee: Secondary | ICD-10-CM | POA: Diagnosis not present

## 2021-01-12 DIAGNOSIS — M1990 Unspecified osteoarthritis, unspecified site: Secondary | ICD-10-CM | POA: Diagnosis not present

## 2021-01-12 DIAGNOSIS — R2689 Other abnormalities of gait and mobility: Secondary | ICD-10-CM | POA: Diagnosis not present

## 2021-01-12 DIAGNOSIS — Z8673 Personal history of transient ischemic attack (TIA), and cerebral infarction without residual deficits: Secondary | ICD-10-CM | POA: Diagnosis not present

## 2021-01-12 DIAGNOSIS — R498 Other voice and resonance disorders: Secondary | ICD-10-CM | POA: Diagnosis not present

## 2021-01-12 DIAGNOSIS — R269 Unspecified abnormalities of gait and mobility: Secondary | ICD-10-CM | POA: Diagnosis not present

## 2021-01-12 DIAGNOSIS — R41841 Cognitive communication deficit: Secondary | ICD-10-CM | POA: Diagnosis not present

## 2021-01-12 DIAGNOSIS — R278 Other lack of coordination: Secondary | ICD-10-CM | POA: Diagnosis not present

## 2021-01-12 DIAGNOSIS — R2681 Unsteadiness on feet: Secondary | ICD-10-CM | POA: Diagnosis not present

## 2021-01-12 DIAGNOSIS — Z9181 History of falling: Secondary | ICD-10-CM | POA: Diagnosis not present

## 2021-01-13 DIAGNOSIS — R269 Unspecified abnormalities of gait and mobility: Secondary | ICD-10-CM | POA: Diagnosis not present

## 2021-01-13 DIAGNOSIS — M1990 Unspecified osteoarthritis, unspecified site: Secondary | ICD-10-CM | POA: Diagnosis not present

## 2021-01-13 DIAGNOSIS — Z9181 History of falling: Secondary | ICD-10-CM | POA: Diagnosis not present

## 2021-01-13 DIAGNOSIS — R278 Other lack of coordination: Secondary | ICD-10-CM | POA: Diagnosis not present

## 2021-01-13 DIAGNOSIS — M25561 Pain in right knee: Secondary | ICD-10-CM | POA: Diagnosis not present

## 2021-01-13 DIAGNOSIS — R2689 Other abnormalities of gait and mobility: Secondary | ICD-10-CM | POA: Diagnosis not present

## 2021-01-13 DIAGNOSIS — R498 Other voice and resonance disorders: Secondary | ICD-10-CM | POA: Diagnosis not present

## 2021-01-13 DIAGNOSIS — R41841 Cognitive communication deficit: Secondary | ICD-10-CM | POA: Diagnosis not present

## 2021-01-13 DIAGNOSIS — Z8673 Personal history of transient ischemic attack (TIA), and cerebral infarction without residual deficits: Secondary | ICD-10-CM | POA: Diagnosis not present

## 2021-01-13 DIAGNOSIS — R2681 Unsteadiness on feet: Secondary | ICD-10-CM | POA: Diagnosis not present

## 2021-01-13 DIAGNOSIS — M6258 Muscle wasting and atrophy, not elsewhere classified, other site: Secondary | ICD-10-CM | POA: Diagnosis not present

## 2021-01-13 DIAGNOSIS — M6281 Muscle weakness (generalized): Secondary | ICD-10-CM | POA: Diagnosis not present

## 2021-01-14 DIAGNOSIS — R269 Unspecified abnormalities of gait and mobility: Secondary | ICD-10-CM | POA: Diagnosis not present

## 2021-01-14 DIAGNOSIS — R278 Other lack of coordination: Secondary | ICD-10-CM | POA: Diagnosis not present

## 2021-01-14 DIAGNOSIS — R2689 Other abnormalities of gait and mobility: Secondary | ICD-10-CM | POA: Diagnosis not present

## 2021-01-14 DIAGNOSIS — M25561 Pain in right knee: Secondary | ICD-10-CM | POA: Diagnosis not present

## 2021-01-14 DIAGNOSIS — Z9181 History of falling: Secondary | ICD-10-CM | POA: Diagnosis not present

## 2021-01-14 DIAGNOSIS — M6281 Muscle weakness (generalized): Secondary | ICD-10-CM | POA: Diagnosis not present

## 2021-01-14 DIAGNOSIS — Z8673 Personal history of transient ischemic attack (TIA), and cerebral infarction without residual deficits: Secondary | ICD-10-CM | POA: Diagnosis not present

## 2021-01-14 DIAGNOSIS — M6258 Muscle wasting and atrophy, not elsewhere classified, other site: Secondary | ICD-10-CM | POA: Diagnosis not present

## 2021-01-14 DIAGNOSIS — R498 Other voice and resonance disorders: Secondary | ICD-10-CM | POA: Diagnosis not present

## 2021-01-14 DIAGNOSIS — M1990 Unspecified osteoarthritis, unspecified site: Secondary | ICD-10-CM | POA: Diagnosis not present

## 2021-01-14 DIAGNOSIS — R2681 Unsteadiness on feet: Secondary | ICD-10-CM | POA: Diagnosis not present

## 2021-01-14 DIAGNOSIS — R41841 Cognitive communication deficit: Secondary | ICD-10-CM | POA: Diagnosis not present

## 2021-01-15 DIAGNOSIS — M6281 Muscle weakness (generalized): Secondary | ICD-10-CM | POA: Diagnosis not present

## 2021-01-15 DIAGNOSIS — M6258 Muscle wasting and atrophy, not elsewhere classified, other site: Secondary | ICD-10-CM | POA: Diagnosis not present

## 2021-01-15 DIAGNOSIS — R269 Unspecified abnormalities of gait and mobility: Secondary | ICD-10-CM | POA: Diagnosis not present

## 2021-01-15 DIAGNOSIS — R2681 Unsteadiness on feet: Secondary | ICD-10-CM | POA: Diagnosis not present

## 2021-01-15 DIAGNOSIS — R498 Other voice and resonance disorders: Secondary | ICD-10-CM | POA: Diagnosis not present

## 2021-01-15 DIAGNOSIS — M1990 Unspecified osteoarthritis, unspecified site: Secondary | ICD-10-CM | POA: Diagnosis not present

## 2021-01-15 DIAGNOSIS — M25561 Pain in right knee: Secondary | ICD-10-CM | POA: Diagnosis not present

## 2021-01-15 DIAGNOSIS — R41841 Cognitive communication deficit: Secondary | ICD-10-CM | POA: Diagnosis not present

## 2021-01-15 DIAGNOSIS — R278 Other lack of coordination: Secondary | ICD-10-CM | POA: Diagnosis not present

## 2021-01-15 DIAGNOSIS — Z9181 History of falling: Secondary | ICD-10-CM | POA: Diagnosis not present

## 2021-01-15 DIAGNOSIS — Z8673 Personal history of transient ischemic attack (TIA), and cerebral infarction without residual deficits: Secondary | ICD-10-CM | POA: Diagnosis not present

## 2021-01-15 DIAGNOSIS — R2689 Other abnormalities of gait and mobility: Secondary | ICD-10-CM | POA: Diagnosis not present

## 2021-01-16 NOTE — Progress Notes (Signed)
Remote pacemaker transmission.   

## 2021-01-18 DIAGNOSIS — M6258 Muscle wasting and atrophy, not elsewhere classified, other site: Secondary | ICD-10-CM | POA: Diagnosis not present

## 2021-01-18 DIAGNOSIS — M25561 Pain in right knee: Secondary | ICD-10-CM | POA: Diagnosis not present

## 2021-01-18 DIAGNOSIS — Z8673 Personal history of transient ischemic attack (TIA), and cerebral infarction without residual deficits: Secondary | ICD-10-CM | POA: Diagnosis not present

## 2021-01-18 DIAGNOSIS — Z9181 History of falling: Secondary | ICD-10-CM | POA: Diagnosis not present

## 2021-01-18 DIAGNOSIS — R2689 Other abnormalities of gait and mobility: Secondary | ICD-10-CM | POA: Diagnosis not present

## 2021-01-18 DIAGNOSIS — R1312 Dysphagia, oropharyngeal phase: Secondary | ICD-10-CM | POA: Diagnosis not present

## 2021-01-18 DIAGNOSIS — R2681 Unsteadiness on feet: Secondary | ICD-10-CM | POA: Diagnosis not present

## 2021-01-18 DIAGNOSIS — R41841 Cognitive communication deficit: Secondary | ICD-10-CM | POA: Diagnosis not present

## 2021-01-18 DIAGNOSIS — R498 Other voice and resonance disorders: Secondary | ICD-10-CM | POA: Diagnosis not present

## 2021-01-18 DIAGNOSIS — R269 Unspecified abnormalities of gait and mobility: Secondary | ICD-10-CM | POA: Diagnosis not present

## 2021-01-18 DIAGNOSIS — M1990 Unspecified osteoarthritis, unspecified site: Secondary | ICD-10-CM | POA: Diagnosis not present

## 2021-01-18 DIAGNOSIS — M6281 Muscle weakness (generalized): Secondary | ICD-10-CM | POA: Diagnosis not present

## 2021-01-18 DIAGNOSIS — R278 Other lack of coordination: Secondary | ICD-10-CM | POA: Diagnosis not present

## 2021-01-19 DIAGNOSIS — R41841 Cognitive communication deficit: Secondary | ICD-10-CM | POA: Diagnosis not present

## 2021-01-19 DIAGNOSIS — R2681 Unsteadiness on feet: Secondary | ICD-10-CM | POA: Diagnosis not present

## 2021-01-19 DIAGNOSIS — R498 Other voice and resonance disorders: Secondary | ICD-10-CM | POA: Diagnosis not present

## 2021-01-19 DIAGNOSIS — M25561 Pain in right knee: Secondary | ICD-10-CM | POA: Diagnosis not present

## 2021-01-19 DIAGNOSIS — Z8673 Personal history of transient ischemic attack (TIA), and cerebral infarction without residual deficits: Secondary | ICD-10-CM | POA: Diagnosis not present

## 2021-01-19 DIAGNOSIS — R269 Unspecified abnormalities of gait and mobility: Secondary | ICD-10-CM | POA: Diagnosis not present

## 2021-01-19 DIAGNOSIS — M1990 Unspecified osteoarthritis, unspecified site: Secondary | ICD-10-CM | POA: Diagnosis not present

## 2021-01-19 DIAGNOSIS — M6281 Muscle weakness (generalized): Secondary | ICD-10-CM | POA: Diagnosis not present

## 2021-01-19 DIAGNOSIS — R1312 Dysphagia, oropharyngeal phase: Secondary | ICD-10-CM | POA: Diagnosis not present

## 2021-01-19 DIAGNOSIS — R278 Other lack of coordination: Secondary | ICD-10-CM | POA: Diagnosis not present

## 2021-01-19 DIAGNOSIS — Z9181 History of falling: Secondary | ICD-10-CM | POA: Diagnosis not present

## 2021-01-19 DIAGNOSIS — R2689 Other abnormalities of gait and mobility: Secondary | ICD-10-CM | POA: Diagnosis not present

## 2021-01-19 DIAGNOSIS — M6258 Muscle wasting and atrophy, not elsewhere classified, other site: Secondary | ICD-10-CM | POA: Diagnosis not present

## 2021-01-20 DIAGNOSIS — R630 Anorexia: Secondary | ICD-10-CM | POA: Diagnosis not present

## 2021-01-20 DIAGNOSIS — R600 Localized edema: Secondary | ICD-10-CM | POA: Diagnosis not present

## 2021-01-20 DIAGNOSIS — I2510 Atherosclerotic heart disease of native coronary artery without angina pectoris: Secondary | ICD-10-CM | POA: Diagnosis not present

## 2021-01-20 DIAGNOSIS — L820 Inflamed seborrheic keratosis: Secondary | ICD-10-CM | POA: Diagnosis not present

## 2021-01-20 DIAGNOSIS — L821 Other seborrheic keratosis: Secondary | ICD-10-CM | POA: Diagnosis not present

## 2021-01-20 DIAGNOSIS — D1801 Hemangioma of skin and subcutaneous tissue: Secondary | ICD-10-CM | POA: Diagnosis not present

## 2021-01-20 DIAGNOSIS — I119 Hypertensive heart disease without heart failure: Secondary | ICD-10-CM | POA: Diagnosis not present

## 2021-01-21 DIAGNOSIS — R1312 Dysphagia, oropharyngeal phase: Secondary | ICD-10-CM | POA: Diagnosis not present

## 2021-01-21 DIAGNOSIS — R498 Other voice and resonance disorders: Secondary | ICD-10-CM | POA: Diagnosis not present

## 2021-01-21 DIAGNOSIS — Z8673 Personal history of transient ischemic attack (TIA), and cerebral infarction without residual deficits: Secondary | ICD-10-CM | POA: Diagnosis not present

## 2021-01-21 DIAGNOSIS — R269 Unspecified abnormalities of gait and mobility: Secondary | ICD-10-CM | POA: Diagnosis not present

## 2021-01-21 DIAGNOSIS — M25561 Pain in right knee: Secondary | ICD-10-CM | POA: Diagnosis not present

## 2021-01-21 DIAGNOSIS — Z9181 History of falling: Secondary | ICD-10-CM | POA: Diagnosis not present

## 2021-01-21 DIAGNOSIS — R2681 Unsteadiness on feet: Secondary | ICD-10-CM | POA: Diagnosis not present

## 2021-01-21 DIAGNOSIS — R278 Other lack of coordination: Secondary | ICD-10-CM | POA: Diagnosis not present

## 2021-01-21 DIAGNOSIS — R41841 Cognitive communication deficit: Secondary | ICD-10-CM | POA: Diagnosis not present

## 2021-01-21 DIAGNOSIS — M6281 Muscle weakness (generalized): Secondary | ICD-10-CM | POA: Diagnosis not present

## 2021-01-21 DIAGNOSIS — M6258 Muscle wasting and atrophy, not elsewhere classified, other site: Secondary | ICD-10-CM | POA: Diagnosis not present

## 2021-01-21 DIAGNOSIS — R2689 Other abnormalities of gait and mobility: Secondary | ICD-10-CM | POA: Diagnosis not present

## 2021-01-21 DIAGNOSIS — M1990 Unspecified osteoarthritis, unspecified site: Secondary | ICD-10-CM | POA: Diagnosis not present

## 2021-01-22 DIAGNOSIS — M6258 Muscle wasting and atrophy, not elsewhere classified, other site: Secondary | ICD-10-CM | POA: Diagnosis not present

## 2021-01-22 DIAGNOSIS — R498 Other voice and resonance disorders: Secondary | ICD-10-CM | POA: Diagnosis not present

## 2021-01-22 DIAGNOSIS — R278 Other lack of coordination: Secondary | ICD-10-CM | POA: Diagnosis not present

## 2021-01-22 DIAGNOSIS — Z9181 History of falling: Secondary | ICD-10-CM | POA: Diagnosis not present

## 2021-01-22 DIAGNOSIS — M25561 Pain in right knee: Secondary | ICD-10-CM | POA: Diagnosis not present

## 2021-01-22 DIAGNOSIS — R2681 Unsteadiness on feet: Secondary | ICD-10-CM | POA: Diagnosis not present

## 2021-01-22 DIAGNOSIS — M1990 Unspecified osteoarthritis, unspecified site: Secondary | ICD-10-CM | POA: Diagnosis not present

## 2021-01-22 DIAGNOSIS — R269 Unspecified abnormalities of gait and mobility: Secondary | ICD-10-CM | POA: Diagnosis not present

## 2021-01-22 DIAGNOSIS — Z8673 Personal history of transient ischemic attack (TIA), and cerebral infarction without residual deficits: Secondary | ICD-10-CM | POA: Diagnosis not present

## 2021-01-22 DIAGNOSIS — R41841 Cognitive communication deficit: Secondary | ICD-10-CM | POA: Diagnosis not present

## 2021-01-22 DIAGNOSIS — M6281 Muscle weakness (generalized): Secondary | ICD-10-CM | POA: Diagnosis not present

## 2021-01-22 DIAGNOSIS — R2689 Other abnormalities of gait and mobility: Secondary | ICD-10-CM | POA: Diagnosis not present

## 2021-01-22 DIAGNOSIS — R1312 Dysphagia, oropharyngeal phase: Secondary | ICD-10-CM | POA: Diagnosis not present

## 2021-01-23 DIAGNOSIS — M6258 Muscle wasting and atrophy, not elsewhere classified, other site: Secondary | ICD-10-CM | POA: Diagnosis not present

## 2021-01-23 DIAGNOSIS — R269 Unspecified abnormalities of gait and mobility: Secondary | ICD-10-CM | POA: Diagnosis not present

## 2021-01-23 DIAGNOSIS — M25561 Pain in right knee: Secondary | ICD-10-CM | POA: Diagnosis not present

## 2021-01-23 DIAGNOSIS — R278 Other lack of coordination: Secondary | ICD-10-CM | POA: Diagnosis not present

## 2021-01-23 DIAGNOSIS — Z8673 Personal history of transient ischemic attack (TIA), and cerebral infarction without residual deficits: Secondary | ICD-10-CM | POA: Diagnosis not present

## 2021-01-23 DIAGNOSIS — R2689 Other abnormalities of gait and mobility: Secondary | ICD-10-CM | POA: Diagnosis not present

## 2021-01-23 DIAGNOSIS — R2681 Unsteadiness on feet: Secondary | ICD-10-CM | POA: Diagnosis not present

## 2021-01-23 DIAGNOSIS — M1990 Unspecified osteoarthritis, unspecified site: Secondary | ICD-10-CM | POA: Diagnosis not present

## 2021-01-23 DIAGNOSIS — R1312 Dysphagia, oropharyngeal phase: Secondary | ICD-10-CM | POA: Diagnosis not present

## 2021-01-23 DIAGNOSIS — Z9181 History of falling: Secondary | ICD-10-CM | POA: Diagnosis not present

## 2021-01-23 DIAGNOSIS — R41841 Cognitive communication deficit: Secondary | ICD-10-CM | POA: Diagnosis not present

## 2021-01-23 DIAGNOSIS — M6281 Muscle weakness (generalized): Secondary | ICD-10-CM | POA: Diagnosis not present

## 2021-01-23 DIAGNOSIS — R498 Other voice and resonance disorders: Secondary | ICD-10-CM | POA: Diagnosis not present

## 2021-01-25 DIAGNOSIS — M6281 Muscle weakness (generalized): Secondary | ICD-10-CM | POA: Diagnosis not present

## 2021-01-25 DIAGNOSIS — R269 Unspecified abnormalities of gait and mobility: Secondary | ICD-10-CM | POA: Diagnosis not present

## 2021-01-25 DIAGNOSIS — R498 Other voice and resonance disorders: Secondary | ICD-10-CM | POA: Diagnosis not present

## 2021-01-25 DIAGNOSIS — R41841 Cognitive communication deficit: Secondary | ICD-10-CM | POA: Diagnosis not present

## 2021-01-25 DIAGNOSIS — R278 Other lack of coordination: Secondary | ICD-10-CM | POA: Diagnosis not present

## 2021-01-25 DIAGNOSIS — Z8673 Personal history of transient ischemic attack (TIA), and cerebral infarction without residual deficits: Secondary | ICD-10-CM | POA: Diagnosis not present

## 2021-01-25 DIAGNOSIS — Z9181 History of falling: Secondary | ICD-10-CM | POA: Diagnosis not present

## 2021-01-25 DIAGNOSIS — R1312 Dysphagia, oropharyngeal phase: Secondary | ICD-10-CM | POA: Diagnosis not present

## 2021-01-25 DIAGNOSIS — M25561 Pain in right knee: Secondary | ICD-10-CM | POA: Diagnosis not present

## 2021-01-25 DIAGNOSIS — M6258 Muscle wasting and atrophy, not elsewhere classified, other site: Secondary | ICD-10-CM | POA: Diagnosis not present

## 2021-01-25 DIAGNOSIS — M1990 Unspecified osteoarthritis, unspecified site: Secondary | ICD-10-CM | POA: Diagnosis not present

## 2021-01-25 DIAGNOSIS — R2689 Other abnormalities of gait and mobility: Secondary | ICD-10-CM | POA: Diagnosis not present

## 2021-01-25 DIAGNOSIS — R2681 Unsteadiness on feet: Secondary | ICD-10-CM | POA: Diagnosis not present

## 2021-01-26 DIAGNOSIS — R498 Other voice and resonance disorders: Secondary | ICD-10-CM | POA: Diagnosis not present

## 2021-01-26 DIAGNOSIS — R1312 Dysphagia, oropharyngeal phase: Secondary | ICD-10-CM | POA: Diagnosis not present

## 2021-01-26 DIAGNOSIS — R269 Unspecified abnormalities of gait and mobility: Secondary | ICD-10-CM | POA: Diagnosis not present

## 2021-01-26 DIAGNOSIS — R2681 Unsteadiness on feet: Secondary | ICD-10-CM | POA: Diagnosis not present

## 2021-01-26 DIAGNOSIS — M6258 Muscle wasting and atrophy, not elsewhere classified, other site: Secondary | ICD-10-CM | POA: Diagnosis not present

## 2021-01-26 DIAGNOSIS — M6281 Muscle weakness (generalized): Secondary | ICD-10-CM | POA: Diagnosis not present

## 2021-01-26 DIAGNOSIS — M1990 Unspecified osteoarthritis, unspecified site: Secondary | ICD-10-CM | POA: Diagnosis not present

## 2021-01-26 DIAGNOSIS — M25561 Pain in right knee: Secondary | ICD-10-CM | POA: Diagnosis not present

## 2021-01-26 DIAGNOSIS — R278 Other lack of coordination: Secondary | ICD-10-CM | POA: Diagnosis not present

## 2021-01-26 DIAGNOSIS — R2689 Other abnormalities of gait and mobility: Secondary | ICD-10-CM | POA: Diagnosis not present

## 2021-01-26 DIAGNOSIS — Z9181 History of falling: Secondary | ICD-10-CM | POA: Diagnosis not present

## 2021-01-26 DIAGNOSIS — R41841 Cognitive communication deficit: Secondary | ICD-10-CM | POA: Diagnosis not present

## 2021-01-26 DIAGNOSIS — Z8673 Personal history of transient ischemic attack (TIA), and cerebral infarction without residual deficits: Secondary | ICD-10-CM | POA: Diagnosis not present

## 2021-01-27 DIAGNOSIS — R2689 Other abnormalities of gait and mobility: Secondary | ICD-10-CM | POA: Diagnosis not present

## 2021-01-27 DIAGNOSIS — Z9181 History of falling: Secondary | ICD-10-CM | POA: Diagnosis not present

## 2021-01-27 DIAGNOSIS — M6258 Muscle wasting and atrophy, not elsewhere classified, other site: Secondary | ICD-10-CM | POA: Diagnosis not present

## 2021-01-27 DIAGNOSIS — Z8673 Personal history of transient ischemic attack (TIA), and cerebral infarction without residual deficits: Secondary | ICD-10-CM | POA: Diagnosis not present

## 2021-01-27 DIAGNOSIS — M25561 Pain in right knee: Secondary | ICD-10-CM | POA: Diagnosis not present

## 2021-01-27 DIAGNOSIS — R278 Other lack of coordination: Secondary | ICD-10-CM | POA: Diagnosis not present

## 2021-01-27 DIAGNOSIS — M1990 Unspecified osteoarthritis, unspecified site: Secondary | ICD-10-CM | POA: Diagnosis not present

## 2021-01-27 DIAGNOSIS — R498 Other voice and resonance disorders: Secondary | ICD-10-CM | POA: Diagnosis not present

## 2021-01-27 DIAGNOSIS — R2681 Unsteadiness on feet: Secondary | ICD-10-CM | POA: Diagnosis not present

## 2021-01-27 DIAGNOSIS — R41841 Cognitive communication deficit: Secondary | ICD-10-CM | POA: Diagnosis not present

## 2021-01-27 DIAGNOSIS — M6281 Muscle weakness (generalized): Secondary | ICD-10-CM | POA: Diagnosis not present

## 2021-01-27 DIAGNOSIS — R1312 Dysphagia, oropharyngeal phase: Secondary | ICD-10-CM | POA: Diagnosis not present

## 2021-01-27 DIAGNOSIS — R269 Unspecified abnormalities of gait and mobility: Secondary | ICD-10-CM | POA: Diagnosis not present

## 2021-01-28 DIAGNOSIS — R1312 Dysphagia, oropharyngeal phase: Secondary | ICD-10-CM | POA: Diagnosis not present

## 2021-01-28 DIAGNOSIS — R2681 Unsteadiness on feet: Secondary | ICD-10-CM | POA: Diagnosis not present

## 2021-01-28 DIAGNOSIS — M6281 Muscle weakness (generalized): Secondary | ICD-10-CM | POA: Diagnosis not present

## 2021-01-28 DIAGNOSIS — Z8673 Personal history of transient ischemic attack (TIA), and cerebral infarction without residual deficits: Secondary | ICD-10-CM | POA: Diagnosis not present

## 2021-01-28 DIAGNOSIS — M25561 Pain in right knee: Secondary | ICD-10-CM | POA: Diagnosis not present

## 2021-01-28 DIAGNOSIS — R278 Other lack of coordination: Secondary | ICD-10-CM | POA: Diagnosis not present

## 2021-01-28 DIAGNOSIS — R2689 Other abnormalities of gait and mobility: Secondary | ICD-10-CM | POA: Diagnosis not present

## 2021-01-28 DIAGNOSIS — Z9181 History of falling: Secondary | ICD-10-CM | POA: Diagnosis not present

## 2021-01-28 DIAGNOSIS — R41841 Cognitive communication deficit: Secondary | ICD-10-CM | POA: Diagnosis not present

## 2021-01-28 DIAGNOSIS — M6258 Muscle wasting and atrophy, not elsewhere classified, other site: Secondary | ICD-10-CM | POA: Diagnosis not present

## 2021-01-28 DIAGNOSIS — R269 Unspecified abnormalities of gait and mobility: Secondary | ICD-10-CM | POA: Diagnosis not present

## 2021-01-28 DIAGNOSIS — M1990 Unspecified osteoarthritis, unspecified site: Secondary | ICD-10-CM | POA: Diagnosis not present

## 2021-01-28 DIAGNOSIS — R498 Other voice and resonance disorders: Secondary | ICD-10-CM | POA: Diagnosis not present

## 2021-01-29 DIAGNOSIS — R2689 Other abnormalities of gait and mobility: Secondary | ICD-10-CM | POA: Diagnosis not present

## 2021-01-29 DIAGNOSIS — R41841 Cognitive communication deficit: Secondary | ICD-10-CM | POA: Diagnosis not present

## 2021-01-29 DIAGNOSIS — M25561 Pain in right knee: Secondary | ICD-10-CM | POA: Diagnosis not present

## 2021-01-29 DIAGNOSIS — Z8673 Personal history of transient ischemic attack (TIA), and cerebral infarction without residual deficits: Secondary | ICD-10-CM | POA: Diagnosis not present

## 2021-01-29 DIAGNOSIS — R2681 Unsteadiness on feet: Secondary | ICD-10-CM | POA: Diagnosis not present

## 2021-01-29 DIAGNOSIS — R498 Other voice and resonance disorders: Secondary | ICD-10-CM | POA: Diagnosis not present

## 2021-01-29 DIAGNOSIS — R0981 Nasal congestion: Secondary | ICD-10-CM | POA: Diagnosis not present

## 2021-01-29 DIAGNOSIS — R1312 Dysphagia, oropharyngeal phase: Secondary | ICD-10-CM | POA: Diagnosis not present

## 2021-01-29 DIAGNOSIS — M6258 Muscle wasting and atrophy, not elsewhere classified, other site: Secondary | ICD-10-CM | POA: Diagnosis not present

## 2021-01-29 DIAGNOSIS — R278 Other lack of coordination: Secondary | ICD-10-CM | POA: Diagnosis not present

## 2021-01-29 DIAGNOSIS — R269 Unspecified abnormalities of gait and mobility: Secondary | ICD-10-CM | POA: Diagnosis not present

## 2021-01-29 DIAGNOSIS — Z9181 History of falling: Secondary | ICD-10-CM | POA: Diagnosis not present

## 2021-01-29 DIAGNOSIS — M6281 Muscle weakness (generalized): Secondary | ICD-10-CM | POA: Diagnosis not present

## 2021-01-29 DIAGNOSIS — M1990 Unspecified osteoarthritis, unspecified site: Secondary | ICD-10-CM | POA: Diagnosis not present

## 2021-01-30 DIAGNOSIS — M6258 Muscle wasting and atrophy, not elsewhere classified, other site: Secondary | ICD-10-CM | POA: Diagnosis not present

## 2021-01-30 DIAGNOSIS — R498 Other voice and resonance disorders: Secondary | ICD-10-CM | POA: Diagnosis not present

## 2021-01-30 DIAGNOSIS — R41841 Cognitive communication deficit: Secondary | ICD-10-CM | POA: Diagnosis not present

## 2021-01-30 DIAGNOSIS — M25561 Pain in right knee: Secondary | ICD-10-CM | POA: Diagnosis not present

## 2021-01-30 DIAGNOSIS — R278 Other lack of coordination: Secondary | ICD-10-CM | POA: Diagnosis not present

## 2021-01-30 DIAGNOSIS — M1990 Unspecified osteoarthritis, unspecified site: Secondary | ICD-10-CM | POA: Diagnosis not present

## 2021-01-30 DIAGNOSIS — M6281 Muscle weakness (generalized): Secondary | ICD-10-CM | POA: Diagnosis not present

## 2021-01-30 DIAGNOSIS — Z8673 Personal history of transient ischemic attack (TIA), and cerebral infarction without residual deficits: Secondary | ICD-10-CM | POA: Diagnosis not present

## 2021-01-30 DIAGNOSIS — R2681 Unsteadiness on feet: Secondary | ICD-10-CM | POA: Diagnosis not present

## 2021-01-30 DIAGNOSIS — R269 Unspecified abnormalities of gait and mobility: Secondary | ICD-10-CM | POA: Diagnosis not present

## 2021-01-30 DIAGNOSIS — R2689 Other abnormalities of gait and mobility: Secondary | ICD-10-CM | POA: Diagnosis not present

## 2021-01-30 DIAGNOSIS — Z9181 History of falling: Secondary | ICD-10-CM | POA: Diagnosis not present

## 2021-01-30 DIAGNOSIS — R1312 Dysphagia, oropharyngeal phase: Secondary | ICD-10-CM | POA: Diagnosis not present

## 2021-01-31 DIAGNOSIS — M6258 Muscle wasting and atrophy, not elsewhere classified, other site: Secondary | ICD-10-CM | POA: Diagnosis not present

## 2021-01-31 DIAGNOSIS — R2689 Other abnormalities of gait and mobility: Secondary | ICD-10-CM | POA: Diagnosis not present

## 2021-01-31 DIAGNOSIS — R41841 Cognitive communication deficit: Secondary | ICD-10-CM | POA: Diagnosis not present

## 2021-01-31 DIAGNOSIS — R498 Other voice and resonance disorders: Secondary | ICD-10-CM | POA: Diagnosis not present

## 2021-01-31 DIAGNOSIS — M6281 Muscle weakness (generalized): Secondary | ICD-10-CM | POA: Diagnosis not present

## 2021-01-31 DIAGNOSIS — R278 Other lack of coordination: Secondary | ICD-10-CM | POA: Diagnosis not present

## 2021-01-31 DIAGNOSIS — R2681 Unsteadiness on feet: Secondary | ICD-10-CM | POA: Diagnosis not present

## 2021-01-31 DIAGNOSIS — R1312 Dysphagia, oropharyngeal phase: Secondary | ICD-10-CM | POA: Diagnosis not present

## 2021-01-31 DIAGNOSIS — Z9181 History of falling: Secondary | ICD-10-CM | POA: Diagnosis not present

## 2021-01-31 DIAGNOSIS — R269 Unspecified abnormalities of gait and mobility: Secondary | ICD-10-CM | POA: Diagnosis not present

## 2021-01-31 DIAGNOSIS — Z8673 Personal history of transient ischemic attack (TIA), and cerebral infarction without residual deficits: Secondary | ICD-10-CM | POA: Diagnosis not present

## 2021-01-31 DIAGNOSIS — M25561 Pain in right knee: Secondary | ICD-10-CM | POA: Diagnosis not present

## 2021-01-31 DIAGNOSIS — M1990 Unspecified osteoarthritis, unspecified site: Secondary | ICD-10-CM | POA: Diagnosis not present

## 2021-02-01 DIAGNOSIS — R269 Unspecified abnormalities of gait and mobility: Secondary | ICD-10-CM | POA: Diagnosis not present

## 2021-02-01 DIAGNOSIS — R41841 Cognitive communication deficit: Secondary | ICD-10-CM | POA: Diagnosis not present

## 2021-02-01 DIAGNOSIS — M6258 Muscle wasting and atrophy, not elsewhere classified, other site: Secondary | ICD-10-CM | POA: Diagnosis not present

## 2021-02-01 DIAGNOSIS — Z9181 History of falling: Secondary | ICD-10-CM | POA: Diagnosis not present

## 2021-02-01 DIAGNOSIS — R1312 Dysphagia, oropharyngeal phase: Secondary | ICD-10-CM | POA: Diagnosis not present

## 2021-02-01 DIAGNOSIS — M1990 Unspecified osteoarthritis, unspecified site: Secondary | ICD-10-CM | POA: Diagnosis not present

## 2021-02-01 DIAGNOSIS — R2689 Other abnormalities of gait and mobility: Secondary | ICD-10-CM | POA: Diagnosis not present

## 2021-02-01 DIAGNOSIS — Z8673 Personal history of transient ischemic attack (TIA), and cerebral infarction without residual deficits: Secondary | ICD-10-CM | POA: Diagnosis not present

## 2021-02-01 DIAGNOSIS — M25561 Pain in right knee: Secondary | ICD-10-CM | POA: Diagnosis not present

## 2021-02-01 DIAGNOSIS — R498 Other voice and resonance disorders: Secondary | ICD-10-CM | POA: Diagnosis not present

## 2021-02-01 DIAGNOSIS — R278 Other lack of coordination: Secondary | ICD-10-CM | POA: Diagnosis not present

## 2021-02-01 DIAGNOSIS — R2681 Unsteadiness on feet: Secondary | ICD-10-CM | POA: Diagnosis not present

## 2021-02-01 DIAGNOSIS — M6281 Muscle weakness (generalized): Secondary | ICD-10-CM | POA: Diagnosis not present

## 2021-02-02 DIAGNOSIS — M25561 Pain in right knee: Secondary | ICD-10-CM | POA: Diagnosis not present

## 2021-02-02 DIAGNOSIS — R2681 Unsteadiness on feet: Secondary | ICD-10-CM | POA: Diagnosis not present

## 2021-02-02 DIAGNOSIS — R1312 Dysphagia, oropharyngeal phase: Secondary | ICD-10-CM | POA: Diagnosis not present

## 2021-02-02 DIAGNOSIS — R278 Other lack of coordination: Secondary | ICD-10-CM | POA: Diagnosis not present

## 2021-02-02 DIAGNOSIS — M1990 Unspecified osteoarthritis, unspecified site: Secondary | ICD-10-CM | POA: Diagnosis not present

## 2021-02-02 DIAGNOSIS — R41841 Cognitive communication deficit: Secondary | ICD-10-CM | POA: Diagnosis not present

## 2021-02-02 DIAGNOSIS — R498 Other voice and resonance disorders: Secondary | ICD-10-CM | POA: Diagnosis not present

## 2021-02-02 DIAGNOSIS — M6258 Muscle wasting and atrophy, not elsewhere classified, other site: Secondary | ICD-10-CM | POA: Diagnosis not present

## 2021-02-02 DIAGNOSIS — R269 Unspecified abnormalities of gait and mobility: Secondary | ICD-10-CM | POA: Diagnosis not present

## 2021-02-02 DIAGNOSIS — Z8673 Personal history of transient ischemic attack (TIA), and cerebral infarction without residual deficits: Secondary | ICD-10-CM | POA: Diagnosis not present

## 2021-02-02 DIAGNOSIS — Z9181 History of falling: Secondary | ICD-10-CM | POA: Diagnosis not present

## 2021-02-02 DIAGNOSIS — M6281 Muscle weakness (generalized): Secondary | ICD-10-CM | POA: Diagnosis not present

## 2021-02-02 DIAGNOSIS — R2689 Other abnormalities of gait and mobility: Secondary | ICD-10-CM | POA: Diagnosis not present

## 2021-02-03 DIAGNOSIS — M6281 Muscle weakness (generalized): Secondary | ICD-10-CM | POA: Diagnosis not present

## 2021-02-03 DIAGNOSIS — R498 Other voice and resonance disorders: Secondary | ICD-10-CM | POA: Diagnosis not present

## 2021-02-03 DIAGNOSIS — R278 Other lack of coordination: Secondary | ICD-10-CM | POA: Diagnosis not present

## 2021-02-03 DIAGNOSIS — R41841 Cognitive communication deficit: Secondary | ICD-10-CM | POA: Diagnosis not present

## 2021-02-03 DIAGNOSIS — Z8673 Personal history of transient ischemic attack (TIA), and cerebral infarction without residual deficits: Secondary | ICD-10-CM | POA: Diagnosis not present

## 2021-02-03 DIAGNOSIS — R2681 Unsteadiness on feet: Secondary | ICD-10-CM | POA: Diagnosis not present

## 2021-02-03 DIAGNOSIS — R2689 Other abnormalities of gait and mobility: Secondary | ICD-10-CM | POA: Diagnosis not present

## 2021-02-03 DIAGNOSIS — M1990 Unspecified osteoarthritis, unspecified site: Secondary | ICD-10-CM | POA: Diagnosis not present

## 2021-02-03 DIAGNOSIS — M6258 Muscle wasting and atrophy, not elsewhere classified, other site: Secondary | ICD-10-CM | POA: Diagnosis not present

## 2021-02-03 DIAGNOSIS — R1312 Dysphagia, oropharyngeal phase: Secondary | ICD-10-CM | POA: Diagnosis not present

## 2021-02-03 DIAGNOSIS — Z9181 History of falling: Secondary | ICD-10-CM | POA: Diagnosis not present

## 2021-02-03 DIAGNOSIS — M25561 Pain in right knee: Secondary | ICD-10-CM | POA: Diagnosis not present

## 2021-02-03 DIAGNOSIS — R269 Unspecified abnormalities of gait and mobility: Secondary | ICD-10-CM | POA: Diagnosis not present

## 2021-02-04 DIAGNOSIS — R41841 Cognitive communication deficit: Secondary | ICD-10-CM | POA: Diagnosis not present

## 2021-02-04 DIAGNOSIS — R498 Other voice and resonance disorders: Secondary | ICD-10-CM | POA: Diagnosis not present

## 2021-02-04 DIAGNOSIS — M25561 Pain in right knee: Secondary | ICD-10-CM | POA: Diagnosis not present

## 2021-02-04 DIAGNOSIS — M6281 Muscle weakness (generalized): Secondary | ICD-10-CM | POA: Diagnosis not present

## 2021-02-04 DIAGNOSIS — R2689 Other abnormalities of gait and mobility: Secondary | ICD-10-CM | POA: Diagnosis not present

## 2021-02-04 DIAGNOSIS — R269 Unspecified abnormalities of gait and mobility: Secondary | ICD-10-CM | POA: Diagnosis not present

## 2021-02-04 DIAGNOSIS — Z9181 History of falling: Secondary | ICD-10-CM | POA: Diagnosis not present

## 2021-02-04 DIAGNOSIS — R1312 Dysphagia, oropharyngeal phase: Secondary | ICD-10-CM | POA: Diagnosis not present

## 2021-02-04 DIAGNOSIS — R278 Other lack of coordination: Secondary | ICD-10-CM | POA: Diagnosis not present

## 2021-02-04 DIAGNOSIS — Z8673 Personal history of transient ischemic attack (TIA), and cerebral infarction without residual deficits: Secondary | ICD-10-CM | POA: Diagnosis not present

## 2021-02-04 DIAGNOSIS — M1990 Unspecified osteoarthritis, unspecified site: Secondary | ICD-10-CM | POA: Diagnosis not present

## 2021-02-04 DIAGNOSIS — R2681 Unsteadiness on feet: Secondary | ICD-10-CM | POA: Diagnosis not present

## 2021-02-04 DIAGNOSIS — M6258 Muscle wasting and atrophy, not elsewhere classified, other site: Secondary | ICD-10-CM | POA: Diagnosis not present

## 2021-02-05 DIAGNOSIS — M1990 Unspecified osteoarthritis, unspecified site: Secondary | ICD-10-CM | POA: Diagnosis not present

## 2021-02-05 DIAGNOSIS — R2689 Other abnormalities of gait and mobility: Secondary | ICD-10-CM | POA: Diagnosis not present

## 2021-02-05 DIAGNOSIS — R278 Other lack of coordination: Secondary | ICD-10-CM | POA: Diagnosis not present

## 2021-02-05 DIAGNOSIS — M6281 Muscle weakness (generalized): Secondary | ICD-10-CM | POA: Diagnosis not present

## 2021-02-05 DIAGNOSIS — R269 Unspecified abnormalities of gait and mobility: Secondary | ICD-10-CM | POA: Diagnosis not present

## 2021-02-05 DIAGNOSIS — M25561 Pain in right knee: Secondary | ICD-10-CM | POA: Diagnosis not present

## 2021-02-05 DIAGNOSIS — M6258 Muscle wasting and atrophy, not elsewhere classified, other site: Secondary | ICD-10-CM | POA: Diagnosis not present

## 2021-02-05 DIAGNOSIS — R1312 Dysphagia, oropharyngeal phase: Secondary | ICD-10-CM | POA: Diagnosis not present

## 2021-02-05 DIAGNOSIS — Z9181 History of falling: Secondary | ICD-10-CM | POA: Diagnosis not present

## 2021-02-05 DIAGNOSIS — Z8673 Personal history of transient ischemic attack (TIA), and cerebral infarction without residual deficits: Secondary | ICD-10-CM | POA: Diagnosis not present

## 2021-02-05 DIAGNOSIS — R41841 Cognitive communication deficit: Secondary | ICD-10-CM | POA: Diagnosis not present

## 2021-02-05 DIAGNOSIS — R498 Other voice and resonance disorders: Secondary | ICD-10-CM | POA: Diagnosis not present

## 2021-02-05 DIAGNOSIS — R2681 Unsteadiness on feet: Secondary | ICD-10-CM | POA: Diagnosis not present

## 2021-02-06 DIAGNOSIS — M1990 Unspecified osteoarthritis, unspecified site: Secondary | ICD-10-CM | POA: Diagnosis not present

## 2021-02-06 DIAGNOSIS — R269 Unspecified abnormalities of gait and mobility: Secondary | ICD-10-CM | POA: Diagnosis not present

## 2021-02-06 DIAGNOSIS — Z8673 Personal history of transient ischemic attack (TIA), and cerebral infarction without residual deficits: Secondary | ICD-10-CM | POA: Diagnosis not present

## 2021-02-06 DIAGNOSIS — R1312 Dysphagia, oropharyngeal phase: Secondary | ICD-10-CM | POA: Diagnosis not present

## 2021-02-06 DIAGNOSIS — M25561 Pain in right knee: Secondary | ICD-10-CM | POA: Diagnosis not present

## 2021-02-06 DIAGNOSIS — Z9181 History of falling: Secondary | ICD-10-CM | POA: Diagnosis not present

## 2021-02-06 DIAGNOSIS — M6258 Muscle wasting and atrophy, not elsewhere classified, other site: Secondary | ICD-10-CM | POA: Diagnosis not present

## 2021-02-06 DIAGNOSIS — R278 Other lack of coordination: Secondary | ICD-10-CM | POA: Diagnosis not present

## 2021-02-06 DIAGNOSIS — R41841 Cognitive communication deficit: Secondary | ICD-10-CM | POA: Diagnosis not present

## 2021-02-06 DIAGNOSIS — R2689 Other abnormalities of gait and mobility: Secondary | ICD-10-CM | POA: Diagnosis not present

## 2021-02-06 DIAGNOSIS — M6281 Muscle weakness (generalized): Secondary | ICD-10-CM | POA: Diagnosis not present

## 2021-02-06 DIAGNOSIS — R2681 Unsteadiness on feet: Secondary | ICD-10-CM | POA: Diagnosis not present

## 2021-02-06 DIAGNOSIS — R498 Other voice and resonance disorders: Secondary | ICD-10-CM | POA: Diagnosis not present

## 2021-02-07 DIAGNOSIS — R2689 Other abnormalities of gait and mobility: Secondary | ICD-10-CM | POA: Diagnosis not present

## 2021-02-07 DIAGNOSIS — Z8673 Personal history of transient ischemic attack (TIA), and cerebral infarction without residual deficits: Secondary | ICD-10-CM | POA: Diagnosis not present

## 2021-02-07 DIAGNOSIS — Z9181 History of falling: Secondary | ICD-10-CM | POA: Diagnosis not present

## 2021-02-07 DIAGNOSIS — R1312 Dysphagia, oropharyngeal phase: Secondary | ICD-10-CM | POA: Diagnosis not present

## 2021-02-07 DIAGNOSIS — M6258 Muscle wasting and atrophy, not elsewhere classified, other site: Secondary | ICD-10-CM | POA: Diagnosis not present

## 2021-02-07 DIAGNOSIS — R2681 Unsteadiness on feet: Secondary | ICD-10-CM | POA: Diagnosis not present

## 2021-02-07 DIAGNOSIS — M25561 Pain in right knee: Secondary | ICD-10-CM | POA: Diagnosis not present

## 2021-02-07 DIAGNOSIS — M6281 Muscle weakness (generalized): Secondary | ICD-10-CM | POA: Diagnosis not present

## 2021-02-07 DIAGNOSIS — R41841 Cognitive communication deficit: Secondary | ICD-10-CM | POA: Diagnosis not present

## 2021-02-07 DIAGNOSIS — R269 Unspecified abnormalities of gait and mobility: Secondary | ICD-10-CM | POA: Diagnosis not present

## 2021-02-07 DIAGNOSIS — R278 Other lack of coordination: Secondary | ICD-10-CM | POA: Diagnosis not present

## 2021-02-07 DIAGNOSIS — R498 Other voice and resonance disorders: Secondary | ICD-10-CM | POA: Diagnosis not present

## 2021-02-07 DIAGNOSIS — M1990 Unspecified osteoarthritis, unspecified site: Secondary | ICD-10-CM | POA: Diagnosis not present

## 2021-02-08 DIAGNOSIS — Z9181 History of falling: Secondary | ICD-10-CM | POA: Diagnosis not present

## 2021-02-08 DIAGNOSIS — R278 Other lack of coordination: Secondary | ICD-10-CM | POA: Diagnosis not present

## 2021-02-08 DIAGNOSIS — M6281 Muscle weakness (generalized): Secondary | ICD-10-CM | POA: Diagnosis not present

## 2021-02-08 DIAGNOSIS — R2681 Unsteadiness on feet: Secondary | ICD-10-CM | POA: Diagnosis not present

## 2021-02-08 DIAGNOSIS — M1990 Unspecified osteoarthritis, unspecified site: Secondary | ICD-10-CM | POA: Diagnosis not present

## 2021-02-08 DIAGNOSIS — Z8673 Personal history of transient ischemic attack (TIA), and cerebral infarction without residual deficits: Secondary | ICD-10-CM | POA: Diagnosis not present

## 2021-02-08 DIAGNOSIS — R269 Unspecified abnormalities of gait and mobility: Secondary | ICD-10-CM | POA: Diagnosis not present

## 2021-02-08 DIAGNOSIS — R498 Other voice and resonance disorders: Secondary | ICD-10-CM | POA: Diagnosis not present

## 2021-02-08 DIAGNOSIS — R41841 Cognitive communication deficit: Secondary | ICD-10-CM | POA: Diagnosis not present

## 2021-02-08 DIAGNOSIS — M25561 Pain in right knee: Secondary | ICD-10-CM | POA: Diagnosis not present

## 2021-02-08 DIAGNOSIS — R2689 Other abnormalities of gait and mobility: Secondary | ICD-10-CM | POA: Diagnosis not present

## 2021-02-08 DIAGNOSIS — M6258 Muscle wasting and atrophy, not elsewhere classified, other site: Secondary | ICD-10-CM | POA: Diagnosis not present

## 2021-02-08 DIAGNOSIS — R1312 Dysphagia, oropharyngeal phase: Secondary | ICD-10-CM | POA: Diagnosis not present

## 2021-02-09 DIAGNOSIS — R1312 Dysphagia, oropharyngeal phase: Secondary | ICD-10-CM | POA: Diagnosis not present

## 2021-02-09 DIAGNOSIS — M1990 Unspecified osteoarthritis, unspecified site: Secondary | ICD-10-CM | POA: Diagnosis not present

## 2021-02-09 DIAGNOSIS — M6258 Muscle wasting and atrophy, not elsewhere classified, other site: Secondary | ICD-10-CM | POA: Diagnosis not present

## 2021-02-09 DIAGNOSIS — R2681 Unsteadiness on feet: Secondary | ICD-10-CM | POA: Diagnosis not present

## 2021-02-09 DIAGNOSIS — Z8673 Personal history of transient ischemic attack (TIA), and cerebral infarction without residual deficits: Secondary | ICD-10-CM | POA: Diagnosis not present

## 2021-02-09 DIAGNOSIS — M6281 Muscle weakness (generalized): Secondary | ICD-10-CM | POA: Diagnosis not present

## 2021-02-09 DIAGNOSIS — R278 Other lack of coordination: Secondary | ICD-10-CM | POA: Diagnosis not present

## 2021-02-09 DIAGNOSIS — R41841 Cognitive communication deficit: Secondary | ICD-10-CM | POA: Diagnosis not present

## 2021-02-09 DIAGNOSIS — F3489 Other specified persistent mood disorders: Secondary | ICD-10-CM | POA: Diagnosis not present

## 2021-02-09 DIAGNOSIS — R2689 Other abnormalities of gait and mobility: Secondary | ICD-10-CM | POA: Diagnosis not present

## 2021-02-09 DIAGNOSIS — G250 Essential tremor: Secondary | ICD-10-CM | POA: Diagnosis not present

## 2021-02-09 DIAGNOSIS — F418 Other specified anxiety disorders: Secondary | ICD-10-CM | POA: Diagnosis not present

## 2021-02-09 DIAGNOSIS — M25561 Pain in right knee: Secondary | ICD-10-CM | POA: Diagnosis not present

## 2021-02-09 DIAGNOSIS — R498 Other voice and resonance disorders: Secondary | ICD-10-CM | POA: Diagnosis not present

## 2021-02-09 DIAGNOSIS — Z9181 History of falling: Secondary | ICD-10-CM | POA: Diagnosis not present

## 2021-02-09 DIAGNOSIS — R269 Unspecified abnormalities of gait and mobility: Secondary | ICD-10-CM | POA: Diagnosis not present

## 2021-02-10 DIAGNOSIS — M1990 Unspecified osteoarthritis, unspecified site: Secondary | ICD-10-CM | POA: Diagnosis not present

## 2021-02-10 DIAGNOSIS — R278 Other lack of coordination: Secondary | ICD-10-CM | POA: Diagnosis not present

## 2021-02-10 DIAGNOSIS — R269 Unspecified abnormalities of gait and mobility: Secondary | ICD-10-CM | POA: Diagnosis not present

## 2021-02-10 DIAGNOSIS — R41841 Cognitive communication deficit: Secondary | ICD-10-CM | POA: Diagnosis not present

## 2021-02-10 DIAGNOSIS — R2689 Other abnormalities of gait and mobility: Secondary | ICD-10-CM | POA: Diagnosis not present

## 2021-02-10 DIAGNOSIS — M6258 Muscle wasting and atrophy, not elsewhere classified, other site: Secondary | ICD-10-CM | POA: Diagnosis not present

## 2021-02-10 DIAGNOSIS — M6281 Muscle weakness (generalized): Secondary | ICD-10-CM | POA: Diagnosis not present

## 2021-02-10 DIAGNOSIS — R498 Other voice and resonance disorders: Secondary | ICD-10-CM | POA: Diagnosis not present

## 2021-02-10 DIAGNOSIS — R1312 Dysphagia, oropharyngeal phase: Secondary | ICD-10-CM | POA: Diagnosis not present

## 2021-02-10 DIAGNOSIS — R2681 Unsteadiness on feet: Secondary | ICD-10-CM | POA: Diagnosis not present

## 2021-02-10 DIAGNOSIS — Z9181 History of falling: Secondary | ICD-10-CM | POA: Diagnosis not present

## 2021-02-10 DIAGNOSIS — M25561 Pain in right knee: Secondary | ICD-10-CM | POA: Diagnosis not present

## 2021-02-10 DIAGNOSIS — Z8673 Personal history of transient ischemic attack (TIA), and cerebral infarction without residual deficits: Secondary | ICD-10-CM | POA: Diagnosis not present

## 2021-02-11 DIAGNOSIS — R498 Other voice and resonance disorders: Secondary | ICD-10-CM | POA: Diagnosis not present

## 2021-02-11 DIAGNOSIS — M25561 Pain in right knee: Secondary | ICD-10-CM | POA: Diagnosis not present

## 2021-02-11 DIAGNOSIS — Z8673 Personal history of transient ischemic attack (TIA), and cerebral infarction without residual deficits: Secondary | ICD-10-CM | POA: Diagnosis not present

## 2021-02-11 DIAGNOSIS — R269 Unspecified abnormalities of gait and mobility: Secondary | ICD-10-CM | POA: Diagnosis not present

## 2021-02-11 DIAGNOSIS — M6258 Muscle wasting and atrophy, not elsewhere classified, other site: Secondary | ICD-10-CM | POA: Diagnosis not present

## 2021-02-11 DIAGNOSIS — R2681 Unsteadiness on feet: Secondary | ICD-10-CM | POA: Diagnosis not present

## 2021-02-11 DIAGNOSIS — M6281 Muscle weakness (generalized): Secondary | ICD-10-CM | POA: Diagnosis not present

## 2021-02-11 DIAGNOSIS — R1312 Dysphagia, oropharyngeal phase: Secondary | ICD-10-CM | POA: Diagnosis not present

## 2021-02-11 DIAGNOSIS — M1990 Unspecified osteoarthritis, unspecified site: Secondary | ICD-10-CM | POA: Diagnosis not present

## 2021-02-11 DIAGNOSIS — Z9181 History of falling: Secondary | ICD-10-CM | POA: Diagnosis not present

## 2021-02-11 DIAGNOSIS — R41841 Cognitive communication deficit: Secondary | ICD-10-CM | POA: Diagnosis not present

## 2021-02-11 DIAGNOSIS — R2689 Other abnormalities of gait and mobility: Secondary | ICD-10-CM | POA: Diagnosis not present

## 2021-02-11 DIAGNOSIS — R278 Other lack of coordination: Secondary | ICD-10-CM | POA: Diagnosis not present

## 2021-02-12 DIAGNOSIS — R269 Unspecified abnormalities of gait and mobility: Secondary | ICD-10-CM | POA: Diagnosis not present

## 2021-02-12 DIAGNOSIS — R41841 Cognitive communication deficit: Secondary | ICD-10-CM | POA: Diagnosis not present

## 2021-02-12 DIAGNOSIS — M6281 Muscle weakness (generalized): Secondary | ICD-10-CM | POA: Diagnosis not present

## 2021-02-12 DIAGNOSIS — R2681 Unsteadiness on feet: Secondary | ICD-10-CM | POA: Diagnosis not present

## 2021-02-12 DIAGNOSIS — Z8673 Personal history of transient ischemic attack (TIA), and cerebral infarction without residual deficits: Secondary | ICD-10-CM | POA: Diagnosis not present

## 2021-02-12 DIAGNOSIS — R1312 Dysphagia, oropharyngeal phase: Secondary | ICD-10-CM | POA: Diagnosis not present

## 2021-02-12 DIAGNOSIS — R498 Other voice and resonance disorders: Secondary | ICD-10-CM | POA: Diagnosis not present

## 2021-02-12 DIAGNOSIS — R2689 Other abnormalities of gait and mobility: Secondary | ICD-10-CM | POA: Diagnosis not present

## 2021-02-12 DIAGNOSIS — R278 Other lack of coordination: Secondary | ICD-10-CM | POA: Diagnosis not present

## 2021-02-12 DIAGNOSIS — M6258 Muscle wasting and atrophy, not elsewhere classified, other site: Secondary | ICD-10-CM | POA: Diagnosis not present

## 2021-02-12 DIAGNOSIS — M25561 Pain in right knee: Secondary | ICD-10-CM | POA: Diagnosis not present

## 2021-02-12 DIAGNOSIS — M1990 Unspecified osteoarthritis, unspecified site: Secondary | ICD-10-CM | POA: Diagnosis not present

## 2021-02-12 DIAGNOSIS — Z9181 History of falling: Secondary | ICD-10-CM | POA: Diagnosis not present

## 2021-02-13 DIAGNOSIS — R1312 Dysphagia, oropharyngeal phase: Secondary | ICD-10-CM | POA: Diagnosis not present

## 2021-02-13 DIAGNOSIS — M6258 Muscle wasting and atrophy, not elsewhere classified, other site: Secondary | ICD-10-CM | POA: Diagnosis not present

## 2021-02-13 DIAGNOSIS — M1990 Unspecified osteoarthritis, unspecified site: Secondary | ICD-10-CM | POA: Diagnosis not present

## 2021-02-13 DIAGNOSIS — R269 Unspecified abnormalities of gait and mobility: Secondary | ICD-10-CM | POA: Diagnosis not present

## 2021-02-13 DIAGNOSIS — R278 Other lack of coordination: Secondary | ICD-10-CM | POA: Diagnosis not present

## 2021-02-13 DIAGNOSIS — R41841 Cognitive communication deficit: Secondary | ICD-10-CM | POA: Diagnosis not present

## 2021-02-13 DIAGNOSIS — Z9181 History of falling: Secondary | ICD-10-CM | POA: Diagnosis not present

## 2021-02-13 DIAGNOSIS — Z8673 Personal history of transient ischemic attack (TIA), and cerebral infarction without residual deficits: Secondary | ICD-10-CM | POA: Diagnosis not present

## 2021-02-13 DIAGNOSIS — M25561 Pain in right knee: Secondary | ICD-10-CM | POA: Diagnosis not present

## 2021-02-13 DIAGNOSIS — R2681 Unsteadiness on feet: Secondary | ICD-10-CM | POA: Diagnosis not present

## 2021-02-13 DIAGNOSIS — M6281 Muscle weakness (generalized): Secondary | ICD-10-CM | POA: Diagnosis not present

## 2021-02-13 DIAGNOSIS — R498 Other voice and resonance disorders: Secondary | ICD-10-CM | POA: Diagnosis not present

## 2021-02-13 DIAGNOSIS — R2689 Other abnormalities of gait and mobility: Secondary | ICD-10-CM | POA: Diagnosis not present

## 2021-02-14 DIAGNOSIS — R41841 Cognitive communication deficit: Secondary | ICD-10-CM | POA: Diagnosis not present

## 2021-02-14 DIAGNOSIS — R1312 Dysphagia, oropharyngeal phase: Secondary | ICD-10-CM | POA: Diagnosis not present

## 2021-02-14 DIAGNOSIS — R269 Unspecified abnormalities of gait and mobility: Secondary | ICD-10-CM | POA: Diagnosis not present

## 2021-02-14 DIAGNOSIS — R498 Other voice and resonance disorders: Secondary | ICD-10-CM | POA: Diagnosis not present

## 2021-02-14 DIAGNOSIS — M6281 Muscle weakness (generalized): Secondary | ICD-10-CM | POA: Diagnosis not present

## 2021-02-14 DIAGNOSIS — R2689 Other abnormalities of gait and mobility: Secondary | ICD-10-CM | POA: Diagnosis not present

## 2021-02-14 DIAGNOSIS — M6258 Muscle wasting and atrophy, not elsewhere classified, other site: Secondary | ICD-10-CM | POA: Diagnosis not present

## 2021-02-14 DIAGNOSIS — Z8673 Personal history of transient ischemic attack (TIA), and cerebral infarction without residual deficits: Secondary | ICD-10-CM | POA: Diagnosis not present

## 2021-02-14 DIAGNOSIS — R278 Other lack of coordination: Secondary | ICD-10-CM | POA: Diagnosis not present

## 2021-02-14 DIAGNOSIS — M1990 Unspecified osteoarthritis, unspecified site: Secondary | ICD-10-CM | POA: Diagnosis not present

## 2021-02-14 DIAGNOSIS — R2681 Unsteadiness on feet: Secondary | ICD-10-CM | POA: Diagnosis not present

## 2021-02-14 DIAGNOSIS — M25561 Pain in right knee: Secondary | ICD-10-CM | POA: Diagnosis not present

## 2021-02-14 DIAGNOSIS — Z9181 History of falling: Secondary | ICD-10-CM | POA: Diagnosis not present

## 2021-02-15 DIAGNOSIS — R41841 Cognitive communication deficit: Secondary | ICD-10-CM | POA: Diagnosis not present

## 2021-02-15 DIAGNOSIS — R278 Other lack of coordination: Secondary | ICD-10-CM | POA: Diagnosis not present

## 2021-02-15 DIAGNOSIS — M1990 Unspecified osteoarthritis, unspecified site: Secondary | ICD-10-CM | POA: Diagnosis not present

## 2021-02-15 DIAGNOSIS — M6281 Muscle weakness (generalized): Secondary | ICD-10-CM | POA: Diagnosis not present

## 2021-02-15 DIAGNOSIS — R2689 Other abnormalities of gait and mobility: Secondary | ICD-10-CM | POA: Diagnosis not present

## 2021-02-15 DIAGNOSIS — Z8673 Personal history of transient ischemic attack (TIA), and cerebral infarction without residual deficits: Secondary | ICD-10-CM | POA: Diagnosis not present

## 2021-02-15 DIAGNOSIS — R1312 Dysphagia, oropharyngeal phase: Secondary | ICD-10-CM | POA: Diagnosis not present

## 2021-02-15 DIAGNOSIS — M6258 Muscle wasting and atrophy, not elsewhere classified, other site: Secondary | ICD-10-CM | POA: Diagnosis not present

## 2021-02-15 DIAGNOSIS — R269 Unspecified abnormalities of gait and mobility: Secondary | ICD-10-CM | POA: Diagnosis not present

## 2021-02-15 DIAGNOSIS — M25561 Pain in right knee: Secondary | ICD-10-CM | POA: Diagnosis not present

## 2021-02-15 DIAGNOSIS — R2681 Unsteadiness on feet: Secondary | ICD-10-CM | POA: Diagnosis not present

## 2021-02-15 DIAGNOSIS — R498 Other voice and resonance disorders: Secondary | ICD-10-CM | POA: Diagnosis not present

## 2021-02-15 DIAGNOSIS — Z9181 History of falling: Secondary | ICD-10-CM | POA: Diagnosis not present

## 2021-02-16 DIAGNOSIS — M1990 Unspecified osteoarthritis, unspecified site: Secondary | ICD-10-CM | POA: Diagnosis not present

## 2021-02-16 DIAGNOSIS — F331 Major depressive disorder, recurrent, moderate: Secondary | ICD-10-CM | POA: Diagnosis not present

## 2021-02-16 DIAGNOSIS — R278 Other lack of coordination: Secondary | ICD-10-CM | POA: Diagnosis not present

## 2021-02-16 DIAGNOSIS — M6258 Muscle wasting and atrophy, not elsewhere classified, other site: Secondary | ICD-10-CM | POA: Diagnosis not present

## 2021-02-16 DIAGNOSIS — R2681 Unsteadiness on feet: Secondary | ICD-10-CM | POA: Diagnosis not present

## 2021-02-16 DIAGNOSIS — R498 Other voice and resonance disorders: Secondary | ICD-10-CM | POA: Diagnosis not present

## 2021-02-16 DIAGNOSIS — M25561 Pain in right knee: Secondary | ICD-10-CM | POA: Diagnosis not present

## 2021-02-16 DIAGNOSIS — R41841 Cognitive communication deficit: Secondary | ICD-10-CM | POA: Diagnosis not present

## 2021-02-16 DIAGNOSIS — R1312 Dysphagia, oropharyngeal phase: Secondary | ICD-10-CM | POA: Diagnosis not present

## 2021-02-16 DIAGNOSIS — Z8673 Personal history of transient ischemic attack (TIA), and cerebral infarction without residual deficits: Secondary | ICD-10-CM | POA: Diagnosis not present

## 2021-02-16 DIAGNOSIS — M6281 Muscle weakness (generalized): Secondary | ICD-10-CM | POA: Diagnosis not present

## 2021-02-16 DIAGNOSIS — Z9181 History of falling: Secondary | ICD-10-CM | POA: Diagnosis not present

## 2021-02-16 DIAGNOSIS — R2689 Other abnormalities of gait and mobility: Secondary | ICD-10-CM | POA: Diagnosis not present

## 2021-02-16 DIAGNOSIS — F411 Generalized anxiety disorder: Secondary | ICD-10-CM | POA: Diagnosis not present

## 2021-02-16 DIAGNOSIS — R269 Unspecified abnormalities of gait and mobility: Secondary | ICD-10-CM | POA: Diagnosis not present

## 2021-02-17 DIAGNOSIS — R42 Dizziness and giddiness: Secondary | ICD-10-CM | POA: Diagnosis not present

## 2021-02-17 DIAGNOSIS — I5040 Unspecified combined systolic (congestive) and diastolic (congestive) heart failure: Secondary | ICD-10-CM | POA: Diagnosis not present

## 2021-02-17 DIAGNOSIS — R002 Palpitations: Secondary | ICD-10-CM | POA: Diagnosis not present

## 2021-02-18 DIAGNOSIS — Z9181 History of falling: Secondary | ICD-10-CM | POA: Diagnosis not present

## 2021-02-18 DIAGNOSIS — M6258 Muscle wasting and atrophy, not elsewhere classified, other site: Secondary | ICD-10-CM | POA: Diagnosis not present

## 2021-02-18 DIAGNOSIS — R278 Other lack of coordination: Secondary | ICD-10-CM | POA: Diagnosis not present

## 2021-02-18 DIAGNOSIS — M6281 Muscle weakness (generalized): Secondary | ICD-10-CM | POA: Diagnosis not present

## 2021-02-18 DIAGNOSIS — R488 Other symbolic dysfunctions: Secondary | ICD-10-CM | POA: Diagnosis not present

## 2021-02-18 DIAGNOSIS — R41841 Cognitive communication deficit: Secondary | ICD-10-CM | POA: Diagnosis not present

## 2021-02-18 DIAGNOSIS — R1312 Dysphagia, oropharyngeal phase: Secondary | ICD-10-CM | POA: Diagnosis not present

## 2021-02-18 DIAGNOSIS — M1990 Unspecified osteoarthritis, unspecified site: Secondary | ICD-10-CM | POA: Diagnosis not present

## 2021-02-18 DIAGNOSIS — M25561 Pain in right knee: Secondary | ICD-10-CM | POA: Diagnosis not present

## 2021-02-18 DIAGNOSIS — R498 Other voice and resonance disorders: Secondary | ICD-10-CM | POA: Diagnosis not present

## 2021-02-18 DIAGNOSIS — Z8673 Personal history of transient ischemic attack (TIA), and cerebral infarction without residual deficits: Secondary | ICD-10-CM | POA: Diagnosis not present

## 2021-02-18 DIAGNOSIS — I10 Essential (primary) hypertension: Secondary | ICD-10-CM | POA: Diagnosis not present

## 2021-02-18 DIAGNOSIS — R269 Unspecified abnormalities of gait and mobility: Secondary | ICD-10-CM | POA: Diagnosis not present

## 2021-02-18 DIAGNOSIS — D649 Anemia, unspecified: Secondary | ICD-10-CM | POA: Diagnosis not present

## 2021-02-18 DIAGNOSIS — R2681 Unsteadiness on feet: Secondary | ICD-10-CM | POA: Diagnosis not present

## 2021-02-18 DIAGNOSIS — R2689 Other abnormalities of gait and mobility: Secondary | ICD-10-CM | POA: Diagnosis not present

## 2021-02-18 DIAGNOSIS — Z13228 Encounter for screening for other metabolic disorders: Secondary | ICD-10-CM | POA: Diagnosis not present

## 2021-02-19 DIAGNOSIS — R498 Other voice and resonance disorders: Secondary | ICD-10-CM | POA: Diagnosis not present

## 2021-02-19 DIAGNOSIS — R2681 Unsteadiness on feet: Secondary | ICD-10-CM | POA: Diagnosis not present

## 2021-02-19 DIAGNOSIS — Z8673 Personal history of transient ischemic attack (TIA), and cerebral infarction without residual deficits: Secondary | ICD-10-CM | POA: Diagnosis not present

## 2021-02-19 DIAGNOSIS — M1990 Unspecified osteoarthritis, unspecified site: Secondary | ICD-10-CM | POA: Diagnosis not present

## 2021-02-19 DIAGNOSIS — R278 Other lack of coordination: Secondary | ICD-10-CM | POA: Diagnosis not present

## 2021-02-19 DIAGNOSIS — Z9181 History of falling: Secondary | ICD-10-CM | POA: Diagnosis not present

## 2021-02-19 DIAGNOSIS — M6281 Muscle weakness (generalized): Secondary | ICD-10-CM | POA: Diagnosis not present

## 2021-02-19 DIAGNOSIS — R269 Unspecified abnormalities of gait and mobility: Secondary | ICD-10-CM | POA: Diagnosis not present

## 2021-02-19 DIAGNOSIS — M6258 Muscle wasting and atrophy, not elsewhere classified, other site: Secondary | ICD-10-CM | POA: Diagnosis not present

## 2021-02-19 DIAGNOSIS — R41841 Cognitive communication deficit: Secondary | ICD-10-CM | POA: Diagnosis not present

## 2021-02-19 DIAGNOSIS — R1312 Dysphagia, oropharyngeal phase: Secondary | ICD-10-CM | POA: Diagnosis not present

## 2021-02-19 DIAGNOSIS — M25561 Pain in right knee: Secondary | ICD-10-CM | POA: Diagnosis not present

## 2021-02-19 DIAGNOSIS — R2689 Other abnormalities of gait and mobility: Secondary | ICD-10-CM | POA: Diagnosis not present

## 2021-02-20 DIAGNOSIS — M25561 Pain in right knee: Secondary | ICD-10-CM | POA: Diagnosis not present

## 2021-02-20 DIAGNOSIS — R2681 Unsteadiness on feet: Secondary | ICD-10-CM | POA: Diagnosis not present

## 2021-02-20 DIAGNOSIS — R498 Other voice and resonance disorders: Secondary | ICD-10-CM | POA: Diagnosis not present

## 2021-02-20 DIAGNOSIS — M1990 Unspecified osteoarthritis, unspecified site: Secondary | ICD-10-CM | POA: Diagnosis not present

## 2021-02-20 DIAGNOSIS — M6258 Muscle wasting and atrophy, not elsewhere classified, other site: Secondary | ICD-10-CM | POA: Diagnosis not present

## 2021-02-20 DIAGNOSIS — M6281 Muscle weakness (generalized): Secondary | ICD-10-CM | POA: Diagnosis not present

## 2021-02-20 DIAGNOSIS — R1312 Dysphagia, oropharyngeal phase: Secondary | ICD-10-CM | POA: Diagnosis not present

## 2021-02-20 DIAGNOSIS — Z9181 History of falling: Secondary | ICD-10-CM | POA: Diagnosis not present

## 2021-02-20 DIAGNOSIS — R41841 Cognitive communication deficit: Secondary | ICD-10-CM | POA: Diagnosis not present

## 2021-02-20 DIAGNOSIS — Z8673 Personal history of transient ischemic attack (TIA), and cerebral infarction without residual deficits: Secondary | ICD-10-CM | POA: Diagnosis not present

## 2021-02-20 DIAGNOSIS — R269 Unspecified abnormalities of gait and mobility: Secondary | ICD-10-CM | POA: Diagnosis not present

## 2021-02-20 DIAGNOSIS — R2689 Other abnormalities of gait and mobility: Secondary | ICD-10-CM | POA: Diagnosis not present

## 2021-02-20 DIAGNOSIS — R278 Other lack of coordination: Secondary | ICD-10-CM | POA: Diagnosis not present

## 2021-02-23 DIAGNOSIS — M6281 Muscle weakness (generalized): Secondary | ICD-10-CM | POA: Diagnosis not present

## 2021-02-23 DIAGNOSIS — R269 Unspecified abnormalities of gait and mobility: Secondary | ICD-10-CM | POA: Diagnosis not present

## 2021-02-23 DIAGNOSIS — M25561 Pain in right knee: Secondary | ICD-10-CM | POA: Diagnosis not present

## 2021-02-23 DIAGNOSIS — R498 Other voice and resonance disorders: Secondary | ICD-10-CM | POA: Diagnosis not present

## 2021-02-23 DIAGNOSIS — R278 Other lack of coordination: Secondary | ICD-10-CM | POA: Diagnosis not present

## 2021-02-23 DIAGNOSIS — R1312 Dysphagia, oropharyngeal phase: Secondary | ICD-10-CM | POA: Diagnosis not present

## 2021-02-23 DIAGNOSIS — R2689 Other abnormalities of gait and mobility: Secondary | ICD-10-CM | POA: Diagnosis not present

## 2021-02-23 DIAGNOSIS — R41841 Cognitive communication deficit: Secondary | ICD-10-CM | POA: Diagnosis not present

## 2021-02-23 DIAGNOSIS — M6258 Muscle wasting and atrophy, not elsewhere classified, other site: Secondary | ICD-10-CM | POA: Diagnosis not present

## 2021-02-23 DIAGNOSIS — M1990 Unspecified osteoarthritis, unspecified site: Secondary | ICD-10-CM | POA: Diagnosis not present

## 2021-02-23 DIAGNOSIS — Z8673 Personal history of transient ischemic attack (TIA), and cerebral infarction without residual deficits: Secondary | ICD-10-CM | POA: Diagnosis not present

## 2021-02-23 DIAGNOSIS — R2681 Unsteadiness on feet: Secondary | ICD-10-CM | POA: Diagnosis not present

## 2021-02-23 DIAGNOSIS — Z9181 History of falling: Secondary | ICD-10-CM | POA: Diagnosis not present

## 2021-02-24 DIAGNOSIS — R278 Other lack of coordination: Secondary | ICD-10-CM | POA: Diagnosis not present

## 2021-02-24 DIAGNOSIS — M25511 Pain in right shoulder: Secondary | ICD-10-CM | POA: Diagnosis not present

## 2021-02-24 DIAGNOSIS — M1990 Unspecified osteoarthritis, unspecified site: Secondary | ICD-10-CM | POA: Diagnosis not present

## 2021-02-24 DIAGNOSIS — Z8673 Personal history of transient ischemic attack (TIA), and cerebral infarction without residual deficits: Secondary | ICD-10-CM | POA: Diagnosis not present

## 2021-02-24 DIAGNOSIS — Z9181 History of falling: Secondary | ICD-10-CM | POA: Diagnosis not present

## 2021-02-24 DIAGNOSIS — R41841 Cognitive communication deficit: Secondary | ICD-10-CM | POA: Diagnosis not present

## 2021-02-24 DIAGNOSIS — R2681 Unsteadiness on feet: Secondary | ICD-10-CM | POA: Diagnosis not present

## 2021-02-24 DIAGNOSIS — R1312 Dysphagia, oropharyngeal phase: Secondary | ICD-10-CM | POA: Diagnosis not present

## 2021-02-24 DIAGNOSIS — R498 Other voice and resonance disorders: Secondary | ICD-10-CM | POA: Diagnosis not present

## 2021-02-24 DIAGNOSIS — M25561 Pain in right knee: Secondary | ICD-10-CM | POA: Diagnosis not present

## 2021-02-24 DIAGNOSIS — M6258 Muscle wasting and atrophy, not elsewhere classified, other site: Secondary | ICD-10-CM | POA: Diagnosis not present

## 2021-02-24 DIAGNOSIS — I2510 Atherosclerotic heart disease of native coronary artery without angina pectoris: Secondary | ICD-10-CM | POA: Diagnosis not present

## 2021-02-24 DIAGNOSIS — M6281 Muscle weakness (generalized): Secondary | ICD-10-CM | POA: Diagnosis not present

## 2021-02-24 DIAGNOSIS — R2689 Other abnormalities of gait and mobility: Secondary | ICD-10-CM | POA: Diagnosis not present

## 2021-02-24 DIAGNOSIS — R42 Dizziness and giddiness: Secondary | ICD-10-CM | POA: Diagnosis not present

## 2021-02-24 DIAGNOSIS — R269 Unspecified abnormalities of gait and mobility: Secondary | ICD-10-CM | POA: Diagnosis not present

## 2021-02-24 DIAGNOSIS — R002 Palpitations: Secondary | ICD-10-CM | POA: Diagnosis not present

## 2021-02-25 DIAGNOSIS — Z8673 Personal history of transient ischemic attack (TIA), and cerebral infarction without residual deficits: Secondary | ICD-10-CM | POA: Diagnosis not present

## 2021-02-25 DIAGNOSIS — R269 Unspecified abnormalities of gait and mobility: Secondary | ICD-10-CM | POA: Diagnosis not present

## 2021-02-25 DIAGNOSIS — M6281 Muscle weakness (generalized): Secondary | ICD-10-CM | POA: Diagnosis not present

## 2021-02-25 DIAGNOSIS — K5900 Constipation, unspecified: Secondary | ICD-10-CM | POA: Diagnosis not present

## 2021-02-25 DIAGNOSIS — R2681 Unsteadiness on feet: Secondary | ICD-10-CM | POA: Diagnosis not present

## 2021-02-25 DIAGNOSIS — R0602 Shortness of breath: Secondary | ICD-10-CM | POA: Diagnosis not present

## 2021-02-25 DIAGNOSIS — M6258 Muscle wasting and atrophy, not elsewhere classified, other site: Secondary | ICD-10-CM | POA: Diagnosis not present

## 2021-02-25 DIAGNOSIS — R278 Other lack of coordination: Secondary | ICD-10-CM | POA: Diagnosis not present

## 2021-02-25 DIAGNOSIS — R498 Other voice and resonance disorders: Secondary | ICD-10-CM | POA: Diagnosis not present

## 2021-02-25 DIAGNOSIS — R41841 Cognitive communication deficit: Secondary | ICD-10-CM | POA: Diagnosis not present

## 2021-02-25 DIAGNOSIS — R2689 Other abnormalities of gait and mobility: Secondary | ICD-10-CM | POA: Diagnosis not present

## 2021-02-25 DIAGNOSIS — R198 Other specified symptoms and signs involving the digestive system and abdomen: Secondary | ICD-10-CM | POA: Diagnosis not present

## 2021-02-25 DIAGNOSIS — F419 Anxiety disorder, unspecified: Secondary | ICD-10-CM | POA: Diagnosis not present

## 2021-02-25 DIAGNOSIS — R1312 Dysphagia, oropharyngeal phase: Secondary | ICD-10-CM | POA: Diagnosis not present

## 2021-02-25 DIAGNOSIS — M1990 Unspecified osteoarthritis, unspecified site: Secondary | ICD-10-CM | POA: Diagnosis not present

## 2021-02-25 DIAGNOSIS — M25561 Pain in right knee: Secondary | ICD-10-CM | POA: Diagnosis not present

## 2021-02-25 DIAGNOSIS — Z9181 History of falling: Secondary | ICD-10-CM | POA: Diagnosis not present

## 2021-02-26 DIAGNOSIS — R41841 Cognitive communication deficit: Secondary | ICD-10-CM | POA: Diagnosis not present

## 2021-02-26 DIAGNOSIS — R278 Other lack of coordination: Secondary | ICD-10-CM | POA: Diagnosis not present

## 2021-02-26 DIAGNOSIS — R2689 Other abnormalities of gait and mobility: Secondary | ICD-10-CM | POA: Diagnosis not present

## 2021-02-26 DIAGNOSIS — R498 Other voice and resonance disorders: Secondary | ICD-10-CM | POA: Diagnosis not present

## 2021-02-26 DIAGNOSIS — M6281 Muscle weakness (generalized): Secondary | ICD-10-CM | POA: Diagnosis not present

## 2021-02-26 DIAGNOSIS — R2681 Unsteadiness on feet: Secondary | ICD-10-CM | POA: Diagnosis not present

## 2021-02-26 DIAGNOSIS — M1990 Unspecified osteoarthritis, unspecified site: Secondary | ICD-10-CM | POA: Diagnosis not present

## 2021-02-26 DIAGNOSIS — R269 Unspecified abnormalities of gait and mobility: Secondary | ICD-10-CM | POA: Diagnosis not present

## 2021-02-26 DIAGNOSIS — Z8673 Personal history of transient ischemic attack (TIA), and cerebral infarction without residual deficits: Secondary | ICD-10-CM | POA: Diagnosis not present

## 2021-02-26 DIAGNOSIS — M6258 Muscle wasting and atrophy, not elsewhere classified, other site: Secondary | ICD-10-CM | POA: Diagnosis not present

## 2021-02-26 DIAGNOSIS — Z9181 History of falling: Secondary | ICD-10-CM | POA: Diagnosis not present

## 2021-02-26 DIAGNOSIS — R1312 Dysphagia, oropharyngeal phase: Secondary | ICD-10-CM | POA: Diagnosis not present

## 2021-02-26 DIAGNOSIS — M25561 Pain in right knee: Secondary | ICD-10-CM | POA: Diagnosis not present

## 2021-02-27 DIAGNOSIS — M6258 Muscle wasting and atrophy, not elsewhere classified, other site: Secondary | ICD-10-CM | POA: Diagnosis not present

## 2021-02-27 DIAGNOSIS — Z9181 History of falling: Secondary | ICD-10-CM | POA: Diagnosis not present

## 2021-02-27 DIAGNOSIS — R498 Other voice and resonance disorders: Secondary | ICD-10-CM | POA: Diagnosis not present

## 2021-02-27 DIAGNOSIS — R269 Unspecified abnormalities of gait and mobility: Secondary | ICD-10-CM | POA: Diagnosis not present

## 2021-02-27 DIAGNOSIS — Z8673 Personal history of transient ischemic attack (TIA), and cerebral infarction without residual deficits: Secondary | ICD-10-CM | POA: Diagnosis not present

## 2021-02-27 DIAGNOSIS — M1990 Unspecified osteoarthritis, unspecified site: Secondary | ICD-10-CM | POA: Diagnosis not present

## 2021-02-27 DIAGNOSIS — R1312 Dysphagia, oropharyngeal phase: Secondary | ICD-10-CM | POA: Diagnosis not present

## 2021-02-27 DIAGNOSIS — R41841 Cognitive communication deficit: Secondary | ICD-10-CM | POA: Diagnosis not present

## 2021-02-27 DIAGNOSIS — M25561 Pain in right knee: Secondary | ICD-10-CM | POA: Diagnosis not present

## 2021-02-27 DIAGNOSIS — R2681 Unsteadiness on feet: Secondary | ICD-10-CM | POA: Diagnosis not present

## 2021-02-27 DIAGNOSIS — R2689 Other abnormalities of gait and mobility: Secondary | ICD-10-CM | POA: Diagnosis not present

## 2021-02-27 DIAGNOSIS — R278 Other lack of coordination: Secondary | ICD-10-CM | POA: Diagnosis not present

## 2021-02-27 DIAGNOSIS — M6281 Muscle weakness (generalized): Secondary | ICD-10-CM | POA: Diagnosis not present

## 2021-02-28 DIAGNOSIS — R140 Abdominal distension (gaseous): Secondary | ICD-10-CM | POA: Diagnosis not present

## 2021-03-02 DIAGNOSIS — F411 Generalized anxiety disorder: Secondary | ICD-10-CM | POA: Diagnosis not present

## 2021-03-02 DIAGNOSIS — F331 Major depressive disorder, recurrent, moderate: Secondary | ICD-10-CM | POA: Diagnosis not present

## 2021-03-03 DIAGNOSIS — J811 Chronic pulmonary edema: Secondary | ICD-10-CM | POA: Diagnosis not present

## 2021-03-03 DIAGNOSIS — R278 Other lack of coordination: Secondary | ICD-10-CM | POA: Diagnosis not present

## 2021-03-03 DIAGNOSIS — Z8673 Personal history of transient ischemic attack (TIA), and cerebral infarction without residual deficits: Secondary | ICD-10-CM | POA: Diagnosis not present

## 2021-03-03 DIAGNOSIS — M25561 Pain in right knee: Secondary | ICD-10-CM | POA: Diagnosis not present

## 2021-03-03 DIAGNOSIS — M6281 Muscle weakness (generalized): Secondary | ICD-10-CM | POA: Diagnosis not present

## 2021-03-03 DIAGNOSIS — R269 Unspecified abnormalities of gait and mobility: Secondary | ICD-10-CM | POA: Diagnosis not present

## 2021-03-03 DIAGNOSIS — R498 Other voice and resonance disorders: Secondary | ICD-10-CM | POA: Diagnosis not present

## 2021-03-03 DIAGNOSIS — M6258 Muscle wasting and atrophy, not elsewhere classified, other site: Secondary | ICD-10-CM | POA: Diagnosis not present

## 2021-03-03 DIAGNOSIS — R41841 Cognitive communication deficit: Secondary | ICD-10-CM | POA: Diagnosis not present

## 2021-03-03 DIAGNOSIS — R2681 Unsteadiness on feet: Secondary | ICD-10-CM | POA: Diagnosis not present

## 2021-03-03 DIAGNOSIS — Z9181 History of falling: Secondary | ICD-10-CM | POA: Diagnosis not present

## 2021-03-03 DIAGNOSIS — R2689 Other abnormalities of gait and mobility: Secondary | ICD-10-CM | POA: Diagnosis not present

## 2021-03-03 DIAGNOSIS — M1990 Unspecified osteoarthritis, unspecified site: Secondary | ICD-10-CM | POA: Diagnosis not present

## 2021-03-03 DIAGNOSIS — R1312 Dysphagia, oropharyngeal phase: Secondary | ICD-10-CM | POA: Diagnosis not present

## 2021-03-04 DIAGNOSIS — R2681 Unsteadiness on feet: Secondary | ICD-10-CM | POA: Diagnosis not present

## 2021-03-04 DIAGNOSIS — M1990 Unspecified osteoarthritis, unspecified site: Secondary | ICD-10-CM | POA: Diagnosis not present

## 2021-03-04 DIAGNOSIS — Z8673 Personal history of transient ischemic attack (TIA), and cerebral infarction without residual deficits: Secondary | ICD-10-CM | POA: Diagnosis not present

## 2021-03-04 DIAGNOSIS — M6258 Muscle wasting and atrophy, not elsewhere classified, other site: Secondary | ICD-10-CM | POA: Diagnosis not present

## 2021-03-04 DIAGNOSIS — R498 Other voice and resonance disorders: Secondary | ICD-10-CM | POA: Diagnosis not present

## 2021-03-04 DIAGNOSIS — R269 Unspecified abnormalities of gait and mobility: Secondary | ICD-10-CM | POA: Diagnosis not present

## 2021-03-04 DIAGNOSIS — R41841 Cognitive communication deficit: Secondary | ICD-10-CM | POA: Diagnosis not present

## 2021-03-04 DIAGNOSIS — Z9181 History of falling: Secondary | ICD-10-CM | POA: Diagnosis not present

## 2021-03-04 DIAGNOSIS — R2689 Other abnormalities of gait and mobility: Secondary | ICD-10-CM | POA: Diagnosis not present

## 2021-03-04 DIAGNOSIS — M6281 Muscle weakness (generalized): Secondary | ICD-10-CM | POA: Diagnosis not present

## 2021-03-04 DIAGNOSIS — R1312 Dysphagia, oropharyngeal phase: Secondary | ICD-10-CM | POA: Diagnosis not present

## 2021-03-04 DIAGNOSIS — R278 Other lack of coordination: Secondary | ICD-10-CM | POA: Diagnosis not present

## 2021-03-04 DIAGNOSIS — M25561 Pain in right knee: Secondary | ICD-10-CM | POA: Diagnosis not present

## 2021-03-05 DIAGNOSIS — M6281 Muscle weakness (generalized): Secondary | ICD-10-CM | POA: Diagnosis not present

## 2021-03-05 DIAGNOSIS — R278 Other lack of coordination: Secondary | ICD-10-CM | POA: Diagnosis not present

## 2021-03-05 DIAGNOSIS — Z8673 Personal history of transient ischemic attack (TIA), and cerebral infarction without residual deficits: Secondary | ICD-10-CM | POA: Diagnosis not present

## 2021-03-05 DIAGNOSIS — I110 Hypertensive heart disease with heart failure: Secondary | ICD-10-CM | POA: Diagnosis not present

## 2021-03-05 DIAGNOSIS — M6258 Muscle wasting and atrophy, not elsewhere classified, other site: Secondary | ICD-10-CM | POA: Diagnosis not present

## 2021-03-05 DIAGNOSIS — M1990 Unspecified osteoarthritis, unspecified site: Secondary | ICD-10-CM | POA: Diagnosis not present

## 2021-03-05 DIAGNOSIS — R1312 Dysphagia, oropharyngeal phase: Secondary | ICD-10-CM | POA: Diagnosis not present

## 2021-03-05 DIAGNOSIS — R269 Unspecified abnormalities of gait and mobility: Secondary | ICD-10-CM | POA: Diagnosis not present

## 2021-03-05 DIAGNOSIS — I5040 Unspecified combined systolic (congestive) and diastolic (congestive) heart failure: Secondary | ICD-10-CM | POA: Diagnosis not present

## 2021-03-05 DIAGNOSIS — M25561 Pain in right knee: Secondary | ICD-10-CM | POA: Diagnosis not present

## 2021-03-05 DIAGNOSIS — R2689 Other abnormalities of gait and mobility: Secondary | ICD-10-CM | POA: Diagnosis not present

## 2021-03-05 DIAGNOSIS — Z9181 History of falling: Secondary | ICD-10-CM | POA: Diagnosis not present

## 2021-03-05 DIAGNOSIS — I480 Paroxysmal atrial fibrillation: Secondary | ICD-10-CM | POA: Diagnosis not present

## 2021-03-05 DIAGNOSIS — R2681 Unsteadiness on feet: Secondary | ICD-10-CM | POA: Diagnosis not present

## 2021-03-05 DIAGNOSIS — R41841 Cognitive communication deficit: Secondary | ICD-10-CM | POA: Diagnosis not present

## 2021-03-05 DIAGNOSIS — R498 Other voice and resonance disorders: Secondary | ICD-10-CM | POA: Diagnosis not present

## 2021-03-06 DIAGNOSIS — Z8673 Personal history of transient ischemic attack (TIA), and cerebral infarction without residual deficits: Secondary | ICD-10-CM | POA: Diagnosis not present

## 2021-03-06 DIAGNOSIS — R2681 Unsteadiness on feet: Secondary | ICD-10-CM | POA: Diagnosis not present

## 2021-03-06 DIAGNOSIS — M1990 Unspecified osteoarthritis, unspecified site: Secondary | ICD-10-CM | POA: Diagnosis not present

## 2021-03-06 DIAGNOSIS — I130 Hypertensive heart and chronic kidney disease with heart failure and stage 1 through stage 4 chronic kidney disease, or unspecified chronic kidney disease: Secondary | ICD-10-CM | POA: Diagnosis not present

## 2021-03-06 DIAGNOSIS — R2689 Other abnormalities of gait and mobility: Secondary | ICD-10-CM | POA: Diagnosis not present

## 2021-03-06 DIAGNOSIS — R498 Other voice and resonance disorders: Secondary | ICD-10-CM | POA: Diagnosis not present

## 2021-03-06 DIAGNOSIS — R488 Other symbolic dysfunctions: Secondary | ICD-10-CM | POA: Diagnosis not present

## 2021-03-06 DIAGNOSIS — R1312 Dysphagia, oropharyngeal phase: Secondary | ICD-10-CM | POA: Diagnosis not present

## 2021-03-06 DIAGNOSIS — D649 Anemia, unspecified: Secondary | ICD-10-CM | POA: Diagnosis not present

## 2021-03-06 DIAGNOSIS — R269 Unspecified abnormalities of gait and mobility: Secondary | ICD-10-CM | POA: Diagnosis not present

## 2021-03-06 DIAGNOSIS — M6258 Muscle wasting and atrophy, not elsewhere classified, other site: Secondary | ICD-10-CM | POA: Diagnosis not present

## 2021-03-06 DIAGNOSIS — Z13228 Encounter for screening for other metabolic disorders: Secondary | ICD-10-CM | POA: Diagnosis not present

## 2021-03-06 DIAGNOSIS — R278 Other lack of coordination: Secondary | ICD-10-CM | POA: Diagnosis not present

## 2021-03-06 DIAGNOSIS — R41841 Cognitive communication deficit: Secondary | ICD-10-CM | POA: Diagnosis not present

## 2021-03-06 DIAGNOSIS — M25561 Pain in right knee: Secondary | ICD-10-CM | POA: Diagnosis not present

## 2021-03-06 DIAGNOSIS — Z9181 History of falling: Secondary | ICD-10-CM | POA: Diagnosis not present

## 2021-03-06 DIAGNOSIS — M6281 Muscle weakness (generalized): Secondary | ICD-10-CM | POA: Diagnosis not present

## 2021-03-09 ENCOUNTER — Telehealth: Payer: Self-pay | Admitting: Cardiovascular Disease

## 2021-03-09 DIAGNOSIS — M25561 Pain in right knee: Secondary | ICD-10-CM | POA: Diagnosis not present

## 2021-03-09 DIAGNOSIS — Z9181 History of falling: Secondary | ICD-10-CM | POA: Diagnosis not present

## 2021-03-09 DIAGNOSIS — R278 Other lack of coordination: Secondary | ICD-10-CM | POA: Diagnosis not present

## 2021-03-09 DIAGNOSIS — Z8673 Personal history of transient ischemic attack (TIA), and cerebral infarction without residual deficits: Secondary | ICD-10-CM | POA: Diagnosis not present

## 2021-03-09 DIAGNOSIS — R2681 Unsteadiness on feet: Secondary | ICD-10-CM | POA: Diagnosis not present

## 2021-03-09 DIAGNOSIS — R41841 Cognitive communication deficit: Secondary | ICD-10-CM | POA: Diagnosis not present

## 2021-03-09 DIAGNOSIS — M1990 Unspecified osteoarthritis, unspecified site: Secondary | ICD-10-CM | POA: Diagnosis not present

## 2021-03-09 DIAGNOSIS — M6281 Muscle weakness (generalized): Secondary | ICD-10-CM | POA: Diagnosis not present

## 2021-03-09 DIAGNOSIS — R498 Other voice and resonance disorders: Secondary | ICD-10-CM | POA: Diagnosis not present

## 2021-03-09 DIAGNOSIS — R2689 Other abnormalities of gait and mobility: Secondary | ICD-10-CM | POA: Diagnosis not present

## 2021-03-09 DIAGNOSIS — R1312 Dysphagia, oropharyngeal phase: Secondary | ICD-10-CM | POA: Diagnosis not present

## 2021-03-09 DIAGNOSIS — R269 Unspecified abnormalities of gait and mobility: Secondary | ICD-10-CM | POA: Diagnosis not present

## 2021-03-09 DIAGNOSIS — M6258 Muscle wasting and atrophy, not elsewhere classified, other site: Secondary | ICD-10-CM | POA: Diagnosis not present

## 2021-03-09 NOTE — Telephone Encounter (Signed)
Pt c/o Shortness Of Breath: STAT if SOB developed within the last 24 hours or pt is noticeably SOB on the phone  1. Are you currently SOB (can you hear that pt is SOB on the phone)? No   2. How long have you been experiencing SOB? 3 weeks  3. Are you SOB when sitting or when up moving around? Moving around in a wheelchair  4. Are you currently experiencing any other symptoms? No

## 2021-03-09 NOTE — Telephone Encounter (Signed)
Left a message for the patient to call back.  

## 2021-03-10 DIAGNOSIS — R269 Unspecified abnormalities of gait and mobility: Secondary | ICD-10-CM | POA: Diagnosis not present

## 2021-03-10 DIAGNOSIS — R2689 Other abnormalities of gait and mobility: Secondary | ICD-10-CM | POA: Diagnosis not present

## 2021-03-10 DIAGNOSIS — R498 Other voice and resonance disorders: Secondary | ICD-10-CM | POA: Diagnosis not present

## 2021-03-10 DIAGNOSIS — R1312 Dysphagia, oropharyngeal phase: Secondary | ICD-10-CM | POA: Diagnosis not present

## 2021-03-10 DIAGNOSIS — R41841 Cognitive communication deficit: Secondary | ICD-10-CM | POA: Diagnosis not present

## 2021-03-10 DIAGNOSIS — R2681 Unsteadiness on feet: Secondary | ICD-10-CM | POA: Diagnosis not present

## 2021-03-10 DIAGNOSIS — Z8673 Personal history of transient ischemic attack (TIA), and cerebral infarction without residual deficits: Secondary | ICD-10-CM | POA: Diagnosis not present

## 2021-03-10 DIAGNOSIS — M25561 Pain in right knee: Secondary | ICD-10-CM | POA: Diagnosis not present

## 2021-03-10 DIAGNOSIS — M6258 Muscle wasting and atrophy, not elsewhere classified, other site: Secondary | ICD-10-CM | POA: Diagnosis not present

## 2021-03-10 DIAGNOSIS — I5040 Unspecified combined systolic (congestive) and diastolic (congestive) heart failure: Secondary | ICD-10-CM | POA: Diagnosis not present

## 2021-03-10 DIAGNOSIS — Z9181 History of falling: Secondary | ICD-10-CM | POA: Diagnosis not present

## 2021-03-10 DIAGNOSIS — R278 Other lack of coordination: Secondary | ICD-10-CM | POA: Diagnosis not present

## 2021-03-10 DIAGNOSIS — K5901 Slow transit constipation: Secondary | ICD-10-CM | POA: Diagnosis not present

## 2021-03-10 DIAGNOSIS — M6281 Muscle weakness (generalized): Secondary | ICD-10-CM | POA: Diagnosis not present

## 2021-03-10 DIAGNOSIS — M1990 Unspecified osteoarthritis, unspecified site: Secondary | ICD-10-CM | POA: Diagnosis not present

## 2021-03-10 NOTE — Telephone Encounter (Signed)
Pt is returning call from this morning. Pt states he was on the phone when Carlyle called and couldn't switch over but he will keep his phone in his hand until he receives a return call

## 2021-03-10 NOTE — Telephone Encounter (Signed)
Called - left message to call back - let the operator know it is a return call-  ( need to be connected )

## 2021-03-10 NOTE — Telephone Encounter (Signed)
Left message for patient to call back  

## 2021-03-10 NOTE — Telephone Encounter (Signed)
Patient returning call.

## 2021-03-10 NOTE — Telephone Encounter (Signed)
Follow Up:      Patient is returning Lisa's call from yesterday.

## 2021-03-10 NOTE — Telephone Encounter (Signed)
Spoke to patient. Patient states for the last 3 weeks or so . He has shortness of breath when he push himself or move around in hs wheelchair. No shortness of breath while sitting or layingdown,  Patient is at  [facility].  He states he has been there for @ 2 years.    No chest discomfort.  He states "water in legs" the facility is wrapping them.  Patient states he is receiving a diuretic daily . Patient dose not know the medication name.   Patient states he informed his nurse at facility of his issue. RN informed patient to contact  the medical team to evaluate  him - if need can  request a appointment with cardiology office. Patient symptoms are vague.   Patient verbalized understanding.   ( If cardiology needs to contact  leave message on patient phone . He is having trouble with the ringer. Patient will call back)

## 2021-03-10 NOTE — Telephone Encounter (Signed)
Left a message for the patient to call back to discuss Dr. Victorino December recommendations.   Croitoru, Mihai, MD  You; Raiford Simmonds, RN 4 hours ago (12:52 PM)   He has had problems with both edema from HF and orthostatic hypotension from too much diuresis. Usually does best at weight around 170 lb. Has he been weighed recently? I would definitely recommend being weighed and evaluation by team on site before we adjust meds.

## 2021-03-11 DIAGNOSIS — R2689 Other abnormalities of gait and mobility: Secondary | ICD-10-CM | POA: Diagnosis not present

## 2021-03-11 DIAGNOSIS — R41841 Cognitive communication deficit: Secondary | ICD-10-CM | POA: Diagnosis not present

## 2021-03-11 DIAGNOSIS — M1990 Unspecified osteoarthritis, unspecified site: Secondary | ICD-10-CM | POA: Diagnosis not present

## 2021-03-11 DIAGNOSIS — R2681 Unsteadiness on feet: Secondary | ICD-10-CM | POA: Diagnosis not present

## 2021-03-11 DIAGNOSIS — R1312 Dysphagia, oropharyngeal phase: Secondary | ICD-10-CM | POA: Diagnosis not present

## 2021-03-11 DIAGNOSIS — M25561 Pain in right knee: Secondary | ICD-10-CM | POA: Diagnosis not present

## 2021-03-11 DIAGNOSIS — Z8673 Personal history of transient ischemic attack (TIA), and cerebral infarction without residual deficits: Secondary | ICD-10-CM | POA: Diagnosis not present

## 2021-03-11 DIAGNOSIS — R278 Other lack of coordination: Secondary | ICD-10-CM | POA: Diagnosis not present

## 2021-03-11 DIAGNOSIS — M6281 Muscle weakness (generalized): Secondary | ICD-10-CM | POA: Diagnosis not present

## 2021-03-11 DIAGNOSIS — Z9181 History of falling: Secondary | ICD-10-CM | POA: Diagnosis not present

## 2021-03-11 DIAGNOSIS — R269 Unspecified abnormalities of gait and mobility: Secondary | ICD-10-CM | POA: Diagnosis not present

## 2021-03-11 DIAGNOSIS — R498 Other voice and resonance disorders: Secondary | ICD-10-CM | POA: Diagnosis not present

## 2021-03-11 DIAGNOSIS — M6258 Muscle wasting and atrophy, not elsewhere classified, other site: Secondary | ICD-10-CM | POA: Diagnosis not present

## 2021-03-12 DIAGNOSIS — R41841 Cognitive communication deficit: Secondary | ICD-10-CM | POA: Diagnosis not present

## 2021-03-12 DIAGNOSIS — R269 Unspecified abnormalities of gait and mobility: Secondary | ICD-10-CM | POA: Diagnosis not present

## 2021-03-12 DIAGNOSIS — R1312 Dysphagia, oropharyngeal phase: Secondary | ICD-10-CM | POA: Diagnosis not present

## 2021-03-12 DIAGNOSIS — Z8673 Personal history of transient ischemic attack (TIA), and cerebral infarction without residual deficits: Secondary | ICD-10-CM | POA: Diagnosis not present

## 2021-03-12 DIAGNOSIS — M6281 Muscle weakness (generalized): Secondary | ICD-10-CM | POA: Diagnosis not present

## 2021-03-12 DIAGNOSIS — R278 Other lack of coordination: Secondary | ICD-10-CM | POA: Diagnosis not present

## 2021-03-12 DIAGNOSIS — R498 Other voice and resonance disorders: Secondary | ICD-10-CM | POA: Diagnosis not present

## 2021-03-12 DIAGNOSIS — I5042 Chronic combined systolic (congestive) and diastolic (congestive) heart failure: Secondary | ICD-10-CM | POA: Diagnosis not present

## 2021-03-12 DIAGNOSIS — I10 Essential (primary) hypertension: Secondary | ICD-10-CM | POA: Diagnosis not present

## 2021-03-12 DIAGNOSIS — R2689 Other abnormalities of gait and mobility: Secondary | ICD-10-CM | POA: Diagnosis not present

## 2021-03-12 DIAGNOSIS — M25561 Pain in right knee: Secondary | ICD-10-CM | POA: Diagnosis not present

## 2021-03-12 DIAGNOSIS — R2681 Unsteadiness on feet: Secondary | ICD-10-CM | POA: Diagnosis not present

## 2021-03-12 DIAGNOSIS — E441 Mild protein-calorie malnutrition: Secondary | ICD-10-CM | POA: Diagnosis not present

## 2021-03-12 DIAGNOSIS — M6258 Muscle wasting and atrophy, not elsewhere classified, other site: Secondary | ICD-10-CM | POA: Diagnosis not present

## 2021-03-12 DIAGNOSIS — M1990 Unspecified osteoarthritis, unspecified site: Secondary | ICD-10-CM | POA: Diagnosis not present

## 2021-03-12 DIAGNOSIS — Z9181 History of falling: Secondary | ICD-10-CM | POA: Diagnosis not present

## 2021-03-13 DIAGNOSIS — R1312 Dysphagia, oropharyngeal phase: Secondary | ICD-10-CM | POA: Diagnosis not present

## 2021-03-13 DIAGNOSIS — R3589 Other polyuria: Secondary | ICD-10-CM | POA: Diagnosis not present

## 2021-03-13 DIAGNOSIS — R278 Other lack of coordination: Secondary | ICD-10-CM | POA: Diagnosis not present

## 2021-03-13 DIAGNOSIS — R41841 Cognitive communication deficit: Secondary | ICD-10-CM | POA: Diagnosis not present

## 2021-03-13 DIAGNOSIS — M1990 Unspecified osteoarthritis, unspecified site: Secondary | ICD-10-CM | POA: Diagnosis not present

## 2021-03-13 DIAGNOSIS — I700 Atherosclerosis of aorta: Secondary | ICD-10-CM | POA: Diagnosis not present

## 2021-03-13 DIAGNOSIS — M6281 Muscle weakness (generalized): Secondary | ICD-10-CM | POA: Diagnosis not present

## 2021-03-13 DIAGNOSIS — K5900 Constipation, unspecified: Secondary | ICD-10-CM | POA: Diagnosis not present

## 2021-03-13 DIAGNOSIS — R2689 Other abnormalities of gait and mobility: Secondary | ICD-10-CM | POA: Diagnosis not present

## 2021-03-13 DIAGNOSIS — E441 Mild protein-calorie malnutrition: Secondary | ICD-10-CM | POA: Diagnosis not present

## 2021-03-13 DIAGNOSIS — I110 Hypertensive heart disease with heart failure: Secondary | ICD-10-CM | POA: Diagnosis not present

## 2021-03-13 DIAGNOSIS — R2681 Unsteadiness on feet: Secondary | ICD-10-CM | POA: Diagnosis not present

## 2021-03-13 DIAGNOSIS — Z9181 History of falling: Secondary | ICD-10-CM | POA: Diagnosis not present

## 2021-03-13 DIAGNOSIS — I4892 Unspecified atrial flutter: Secondary | ICD-10-CM | POA: Diagnosis not present

## 2021-03-13 DIAGNOSIS — R269 Unspecified abnormalities of gait and mobility: Secondary | ICD-10-CM | POA: Diagnosis not present

## 2021-03-13 DIAGNOSIS — M6258 Muscle wasting and atrophy, not elsewhere classified, other site: Secondary | ICD-10-CM | POA: Diagnosis not present

## 2021-03-13 DIAGNOSIS — Z8673 Personal history of transient ischemic attack (TIA), and cerebral infarction without residual deficits: Secondary | ICD-10-CM | POA: Diagnosis not present

## 2021-03-13 DIAGNOSIS — I5040 Unspecified combined systolic (congestive) and diastolic (congestive) heart failure: Secondary | ICD-10-CM | POA: Diagnosis not present

## 2021-03-13 DIAGNOSIS — E782 Mixed hyperlipidemia: Secondary | ICD-10-CM | POA: Diagnosis not present

## 2021-03-13 DIAGNOSIS — R498 Other voice and resonance disorders: Secondary | ICD-10-CM | POA: Diagnosis not present

## 2021-03-13 DIAGNOSIS — M25561 Pain in right knee: Secondary | ICD-10-CM | POA: Diagnosis not present

## 2021-03-16 DIAGNOSIS — M25561 Pain in right knee: Secondary | ICD-10-CM | POA: Diagnosis not present

## 2021-03-16 DIAGNOSIS — M6281 Muscle weakness (generalized): Secondary | ICD-10-CM | POA: Diagnosis not present

## 2021-03-16 DIAGNOSIS — R498 Other voice and resonance disorders: Secondary | ICD-10-CM | POA: Diagnosis not present

## 2021-03-16 DIAGNOSIS — R1312 Dysphagia, oropharyngeal phase: Secondary | ICD-10-CM | POA: Diagnosis not present

## 2021-03-16 DIAGNOSIS — R269 Unspecified abnormalities of gait and mobility: Secondary | ICD-10-CM | POA: Diagnosis not present

## 2021-03-16 DIAGNOSIS — M1990 Unspecified osteoarthritis, unspecified site: Secondary | ICD-10-CM | POA: Diagnosis not present

## 2021-03-16 DIAGNOSIS — R41841 Cognitive communication deficit: Secondary | ICD-10-CM | POA: Diagnosis not present

## 2021-03-16 DIAGNOSIS — R2681 Unsteadiness on feet: Secondary | ICD-10-CM | POA: Diagnosis not present

## 2021-03-16 DIAGNOSIS — Z8673 Personal history of transient ischemic attack (TIA), and cerebral infarction without residual deficits: Secondary | ICD-10-CM | POA: Diagnosis not present

## 2021-03-16 DIAGNOSIS — Z9181 History of falling: Secondary | ICD-10-CM | POA: Diagnosis not present

## 2021-03-16 DIAGNOSIS — M6258 Muscle wasting and atrophy, not elsewhere classified, other site: Secondary | ICD-10-CM | POA: Diagnosis not present

## 2021-03-16 DIAGNOSIS — R278 Other lack of coordination: Secondary | ICD-10-CM | POA: Diagnosis not present

## 2021-03-16 DIAGNOSIS — R2689 Other abnormalities of gait and mobility: Secondary | ICD-10-CM | POA: Diagnosis not present

## 2021-03-16 NOTE — Telephone Encounter (Signed)
Paul Frazier with [facility] is returning call for recommendations, on behalf of the patient. Please return call to discuss at [phone] (ext#: [phone]).

## 2021-03-16 NOTE — Telephone Encounter (Signed)
Called and spoke with Orland Dec at [facility] notified of providers message, she states that pt's weight 03-03-21 was 185.6; 03-05-21 was 186; 8-23 was 182.8#. but 1 month ago it was 175.-4 Looks like they are doing it every couple days.  Dr Keenan Bachelor and the APP(I think she said NP) there at the facility is [facility]. They should be in tomorrow or sometime this week to evaluate pt . Would you like to adjust the medication/orders? Please advise

## 2021-03-18 DIAGNOSIS — M6258 Muscle wasting and atrophy, not elsewhere classified, other site: Secondary | ICD-10-CM | POA: Diagnosis not present

## 2021-03-18 DIAGNOSIS — R278 Other lack of coordination: Secondary | ICD-10-CM | POA: Diagnosis not present

## 2021-03-18 DIAGNOSIS — Z8673 Personal history of transient ischemic attack (TIA), and cerebral infarction without residual deficits: Secondary | ICD-10-CM | POA: Diagnosis not present

## 2021-03-18 DIAGNOSIS — Z9181 History of falling: Secondary | ICD-10-CM | POA: Diagnosis not present

## 2021-03-18 DIAGNOSIS — M25561 Pain in right knee: Secondary | ICD-10-CM | POA: Diagnosis not present

## 2021-03-18 DIAGNOSIS — R2681 Unsteadiness on feet: Secondary | ICD-10-CM | POA: Diagnosis not present

## 2021-03-18 DIAGNOSIS — M1990 Unspecified osteoarthritis, unspecified site: Secondary | ICD-10-CM | POA: Diagnosis not present

## 2021-03-18 DIAGNOSIS — R1312 Dysphagia, oropharyngeal phase: Secondary | ICD-10-CM | POA: Diagnosis not present

## 2021-03-18 DIAGNOSIS — R269 Unspecified abnormalities of gait and mobility: Secondary | ICD-10-CM | POA: Diagnosis not present

## 2021-03-18 DIAGNOSIS — R41841 Cognitive communication deficit: Secondary | ICD-10-CM | POA: Diagnosis not present

## 2021-03-18 DIAGNOSIS — M6281 Muscle weakness (generalized): Secondary | ICD-10-CM | POA: Diagnosis not present

## 2021-03-18 DIAGNOSIS — R498 Other voice and resonance disorders: Secondary | ICD-10-CM | POA: Diagnosis not present

## 2021-03-18 DIAGNOSIS — R2689 Other abnormalities of gait and mobility: Secondary | ICD-10-CM | POA: Diagnosis not present

## 2021-03-18 NOTE — Telephone Encounter (Signed)
Tried to [employer]

## 2021-03-19 DIAGNOSIS — Z9181 History of falling: Secondary | ICD-10-CM | POA: Diagnosis not present

## 2021-03-19 DIAGNOSIS — M25561 Pain in right knee: Secondary | ICD-10-CM | POA: Diagnosis not present

## 2021-03-19 DIAGNOSIS — M6281 Muscle weakness (generalized): Secondary | ICD-10-CM | POA: Diagnosis not present

## 2021-03-19 DIAGNOSIS — M6258 Muscle wasting and atrophy, not elsewhere classified, other site: Secondary | ICD-10-CM | POA: Diagnosis not present

## 2021-03-19 DIAGNOSIS — R498 Other voice and resonance disorders: Secondary | ICD-10-CM | POA: Diagnosis not present

## 2021-03-19 DIAGNOSIS — Z8673 Personal history of transient ischemic attack (TIA), and cerebral infarction without residual deficits: Secondary | ICD-10-CM | POA: Diagnosis not present

## 2021-03-19 DIAGNOSIS — R1312 Dysphagia, oropharyngeal phase: Secondary | ICD-10-CM | POA: Diagnosis not present

## 2021-03-19 DIAGNOSIS — M1990 Unspecified osteoarthritis, unspecified site: Secondary | ICD-10-CM | POA: Diagnosis not present

## 2021-03-19 DIAGNOSIS — R2681 Unsteadiness on feet: Secondary | ICD-10-CM | POA: Diagnosis not present

## 2021-03-19 DIAGNOSIS — R278 Other lack of coordination: Secondary | ICD-10-CM | POA: Diagnosis not present

## 2021-03-19 DIAGNOSIS — R41841 Cognitive communication deficit: Secondary | ICD-10-CM | POA: Diagnosis not present

## 2021-03-19 DIAGNOSIS — R269 Unspecified abnormalities of gait and mobility: Secondary | ICD-10-CM | POA: Diagnosis not present

## 2021-03-19 DIAGNOSIS — R2689 Other abnormalities of gait and mobility: Secondary | ICD-10-CM | POA: Diagnosis not present

## 2021-03-19 NOTE — Telephone Encounter (Signed)
Called and notified Orland Dec at [facility] of providers message below. She would like this forwarded via EPIC fax function. She will relay to pt's nurse.

## 2021-03-20 DIAGNOSIS — M25561 Pain in right knee: Secondary | ICD-10-CM | POA: Diagnosis not present

## 2021-03-20 DIAGNOSIS — M1990 Unspecified osteoarthritis, unspecified site: Secondary | ICD-10-CM | POA: Diagnosis not present

## 2021-03-20 DIAGNOSIS — M6258 Muscle wasting and atrophy, not elsewhere classified, other site: Secondary | ICD-10-CM | POA: Diagnosis not present

## 2021-03-20 DIAGNOSIS — R2689 Other abnormalities of gait and mobility: Secondary | ICD-10-CM | POA: Diagnosis not present

## 2021-03-20 DIAGNOSIS — R1312 Dysphagia, oropharyngeal phase: Secondary | ICD-10-CM | POA: Diagnosis not present

## 2021-03-20 DIAGNOSIS — Z9181 History of falling: Secondary | ICD-10-CM | POA: Diagnosis not present

## 2021-03-20 DIAGNOSIS — R269 Unspecified abnormalities of gait and mobility: Secondary | ICD-10-CM | POA: Diagnosis not present

## 2021-03-20 DIAGNOSIS — R278 Other lack of coordination: Secondary | ICD-10-CM | POA: Diagnosis not present

## 2021-03-20 DIAGNOSIS — R498 Other voice and resonance disorders: Secondary | ICD-10-CM | POA: Diagnosis not present

## 2021-03-20 DIAGNOSIS — M6281 Muscle weakness (generalized): Secondary | ICD-10-CM | POA: Diagnosis not present

## 2021-03-20 DIAGNOSIS — R41841 Cognitive communication deficit: Secondary | ICD-10-CM | POA: Diagnosis not present

## 2021-03-20 DIAGNOSIS — R2681 Unsteadiness on feet: Secondary | ICD-10-CM | POA: Diagnosis not present

## 2021-03-20 DIAGNOSIS — Z8673 Personal history of transient ischemic attack (TIA), and cerebral infarction without residual deficits: Secondary | ICD-10-CM | POA: Diagnosis not present

## 2021-03-23 DIAGNOSIS — Z8616 Personal history of COVID-19: Secondary | ICD-10-CM | POA: Diagnosis not present

## 2021-03-23 DIAGNOSIS — M6281 Muscle weakness (generalized): Secondary | ICD-10-CM | POA: Diagnosis not present

## 2021-03-23 DIAGNOSIS — M25561 Pain in right knee: Secondary | ICD-10-CM | POA: Diagnosis not present

## 2021-03-23 DIAGNOSIS — R2689 Other abnormalities of gait and mobility: Secondary | ICD-10-CM | POA: Diagnosis not present

## 2021-03-23 DIAGNOSIS — M1990 Unspecified osteoarthritis, unspecified site: Secondary | ICD-10-CM | POA: Diagnosis not present

## 2021-03-23 DIAGNOSIS — Z9181 History of falling: Secondary | ICD-10-CM | POA: Diagnosis not present

## 2021-03-23 DIAGNOSIS — Z8673 Personal history of transient ischemic attack (TIA), and cerebral infarction without residual deficits: Secondary | ICD-10-CM | POA: Diagnosis not present

## 2021-03-23 DIAGNOSIS — R2681 Unsteadiness on feet: Secondary | ICD-10-CM | POA: Diagnosis not present

## 2021-03-23 DIAGNOSIS — R41841 Cognitive communication deficit: Secondary | ICD-10-CM | POA: Diagnosis not present

## 2021-03-23 DIAGNOSIS — M6258 Muscle wasting and atrophy, not elsewhere classified, other site: Secondary | ICD-10-CM | POA: Diagnosis not present

## 2021-03-23 DIAGNOSIS — R52 Pain, unspecified: Secondary | ICD-10-CM | POA: Diagnosis not present

## 2021-03-23 DIAGNOSIS — R269 Unspecified abnormalities of gait and mobility: Secondary | ICD-10-CM | POA: Diagnosis not present

## 2021-03-23 DIAGNOSIS — R1312 Dysphagia, oropharyngeal phase: Secondary | ICD-10-CM | POA: Diagnosis not present

## 2021-03-23 DIAGNOSIS — R498 Other voice and resonance disorders: Secondary | ICD-10-CM | POA: Diagnosis not present

## 2021-03-23 DIAGNOSIS — R278 Other lack of coordination: Secondary | ICD-10-CM | POA: Diagnosis not present

## 2021-03-24 DIAGNOSIS — R601 Generalized edema: Secondary | ICD-10-CM | POA: Diagnosis not present

## 2021-03-24 DIAGNOSIS — M25561 Pain in right knee: Secondary | ICD-10-CM | POA: Diagnosis not present

## 2021-03-24 DIAGNOSIS — R498 Other voice and resonance disorders: Secondary | ICD-10-CM | POA: Diagnosis not present

## 2021-03-24 DIAGNOSIS — R41841 Cognitive communication deficit: Secondary | ICD-10-CM | POA: Diagnosis not present

## 2021-03-24 DIAGNOSIS — M6281 Muscle weakness (generalized): Secondary | ICD-10-CM | POA: Diagnosis not present

## 2021-03-24 DIAGNOSIS — R278 Other lack of coordination: Secondary | ICD-10-CM | POA: Diagnosis not present

## 2021-03-24 DIAGNOSIS — M6258 Muscle wasting and atrophy, not elsewhere classified, other site: Secondary | ICD-10-CM | POA: Diagnosis not present

## 2021-03-24 DIAGNOSIS — M1990 Unspecified osteoarthritis, unspecified site: Secondary | ICD-10-CM | POA: Diagnosis not present

## 2021-03-24 DIAGNOSIS — R269 Unspecified abnormalities of gait and mobility: Secondary | ICD-10-CM | POA: Diagnosis not present

## 2021-03-24 DIAGNOSIS — Z9181 History of falling: Secondary | ICD-10-CM | POA: Diagnosis not present

## 2021-03-24 DIAGNOSIS — R1312 Dysphagia, oropharyngeal phase: Secondary | ICD-10-CM | POA: Diagnosis not present

## 2021-03-24 DIAGNOSIS — R2681 Unsteadiness on feet: Secondary | ICD-10-CM | POA: Diagnosis not present

## 2021-03-24 DIAGNOSIS — Z8673 Personal history of transient ischemic attack (TIA), and cerebral infarction without residual deficits: Secondary | ICD-10-CM | POA: Diagnosis not present

## 2021-03-24 DIAGNOSIS — R2689 Other abnormalities of gait and mobility: Secondary | ICD-10-CM | POA: Diagnosis not present

## 2021-03-24 LAB — CUP PACEART REMOTE DEVICE CHECK
Battery Remaining Longevity: 60 mo
Battery Voltage: 2.96 V
Brady Statistic AP VP Percent: 95.01 %
Brady Statistic AP VS Percent: 0.2 %
Brady Statistic AS VP Percent: 4.11 %
Brady Statistic AS VS Percent: 0.67 %
Brady Statistic RA Percent Paced: 95.73 %
Brady Statistic RV Percent Paced: 99.13 %
Date Time Interrogation Session: 20220906141203
Implantable Lead Implant Date: 20190822
Implantable Lead Implant Date: 20190822
Implantable Lead Location: 753859
Implantable Lead Location: 753860
Implantable Lead Model: 3830
Implantable Lead Model: 5076
Implantable Pulse Generator Implant Date: 20190822
Lead Channel Impedance Value: 304 Ohm
Lead Channel Impedance Value: 304 Ohm
Lead Channel Impedance Value: 361 Ohm
Lead Channel Impedance Value: 456 Ohm
Lead Channel Pacing Threshold Amplitude: 0.75 V
Lead Channel Pacing Threshold Amplitude: 1.25 V
Lead Channel Pacing Threshold Pulse Width: 0.4 ms
Lead Channel Pacing Threshold Pulse Width: 0.4 ms
Lead Channel Sensing Intrinsic Amplitude: 5.625 mV
Lead Channel Sensing Intrinsic Amplitude: 5.625 mV
Lead Channel Sensing Intrinsic Amplitude: 6 mV
Lead Channel Sensing Intrinsic Amplitude: 6 mV
Lead Channel Setting Pacing Amplitude: 1.5 V
Lead Channel Setting Pacing Amplitude: 2.5 V
Lead Channel Setting Pacing Pulse Width: 1 ms
Lead Channel Setting Sensing Sensitivity: 2.8 mV

## 2021-03-25 ENCOUNTER — Ambulatory Visit (INDEPENDENT_AMBULATORY_CARE_PROVIDER_SITE_OTHER): Payer: Medicare Other

## 2021-03-25 DIAGNOSIS — R41841 Cognitive communication deficit: Secondary | ICD-10-CM | POA: Diagnosis not present

## 2021-03-25 DIAGNOSIS — R278 Other lack of coordination: Secondary | ICD-10-CM | POA: Diagnosis not present

## 2021-03-25 DIAGNOSIS — M6281 Muscle weakness (generalized): Secondary | ICD-10-CM | POA: Diagnosis not present

## 2021-03-25 DIAGNOSIS — R2689 Other abnormalities of gait and mobility: Secondary | ICD-10-CM | POA: Diagnosis not present

## 2021-03-25 DIAGNOSIS — I442 Atrioventricular block, complete: Secondary | ICD-10-CM

## 2021-03-25 DIAGNOSIS — Z8673 Personal history of transient ischemic attack (TIA), and cerebral infarction without residual deficits: Secondary | ICD-10-CM | POA: Diagnosis not present

## 2021-03-25 DIAGNOSIS — R1312 Dysphagia, oropharyngeal phase: Secondary | ICD-10-CM | POA: Diagnosis not present

## 2021-03-25 DIAGNOSIS — R269 Unspecified abnormalities of gait and mobility: Secondary | ICD-10-CM | POA: Diagnosis not present

## 2021-03-25 DIAGNOSIS — R2681 Unsteadiness on feet: Secondary | ICD-10-CM | POA: Diagnosis not present

## 2021-03-25 DIAGNOSIS — M1990 Unspecified osteoarthritis, unspecified site: Secondary | ICD-10-CM | POA: Diagnosis not present

## 2021-03-25 DIAGNOSIS — M6258 Muscle wasting and atrophy, not elsewhere classified, other site: Secondary | ICD-10-CM | POA: Diagnosis not present

## 2021-03-25 DIAGNOSIS — Z9181 History of falling: Secondary | ICD-10-CM | POA: Diagnosis not present

## 2021-03-25 DIAGNOSIS — M25561 Pain in right knee: Secondary | ICD-10-CM | POA: Diagnosis not present

## 2021-03-25 DIAGNOSIS — R498 Other voice and resonance disorders: Secondary | ICD-10-CM | POA: Diagnosis not present

## 2021-03-26 DIAGNOSIS — M25561 Pain in right knee: Secondary | ICD-10-CM | POA: Diagnosis not present

## 2021-03-26 DIAGNOSIS — Z9181 History of falling: Secondary | ICD-10-CM | POA: Diagnosis not present

## 2021-03-26 DIAGNOSIS — M6258 Muscle wasting and atrophy, not elsewhere classified, other site: Secondary | ICD-10-CM | POA: Diagnosis not present

## 2021-03-26 DIAGNOSIS — R278 Other lack of coordination: Secondary | ICD-10-CM | POA: Diagnosis not present

## 2021-03-26 DIAGNOSIS — R269 Unspecified abnormalities of gait and mobility: Secondary | ICD-10-CM | POA: Diagnosis not present

## 2021-03-26 DIAGNOSIS — M1990 Unspecified osteoarthritis, unspecified site: Secondary | ICD-10-CM | POA: Diagnosis not present

## 2021-03-26 DIAGNOSIS — Z8673 Personal history of transient ischemic attack (TIA), and cerebral infarction without residual deficits: Secondary | ICD-10-CM | POA: Diagnosis not present

## 2021-03-26 DIAGNOSIS — R1312 Dysphagia, oropharyngeal phase: Secondary | ICD-10-CM | POA: Diagnosis not present

## 2021-03-26 DIAGNOSIS — M6281 Muscle weakness (generalized): Secondary | ICD-10-CM | POA: Diagnosis not present

## 2021-03-26 DIAGNOSIS — R2681 Unsteadiness on feet: Secondary | ICD-10-CM | POA: Diagnosis not present

## 2021-03-26 DIAGNOSIS — R498 Other voice and resonance disorders: Secondary | ICD-10-CM | POA: Diagnosis not present

## 2021-03-26 DIAGNOSIS — R2689 Other abnormalities of gait and mobility: Secondary | ICD-10-CM | POA: Diagnosis not present

## 2021-03-26 DIAGNOSIS — R41841 Cognitive communication deficit: Secondary | ICD-10-CM | POA: Diagnosis not present

## 2021-03-27 DIAGNOSIS — R498 Other voice and resonance disorders: Secondary | ICD-10-CM | POA: Diagnosis not present

## 2021-03-27 DIAGNOSIS — R269 Unspecified abnormalities of gait and mobility: Secondary | ICD-10-CM | POA: Diagnosis not present

## 2021-03-27 DIAGNOSIS — R278 Other lack of coordination: Secondary | ICD-10-CM | POA: Diagnosis not present

## 2021-03-27 DIAGNOSIS — Z9181 History of falling: Secondary | ICD-10-CM | POA: Diagnosis not present

## 2021-03-27 DIAGNOSIS — M6258 Muscle wasting and atrophy, not elsewhere classified, other site: Secondary | ICD-10-CM | POA: Diagnosis not present

## 2021-03-27 DIAGNOSIS — R41841 Cognitive communication deficit: Secondary | ICD-10-CM | POA: Diagnosis not present

## 2021-03-27 DIAGNOSIS — M1990 Unspecified osteoarthritis, unspecified site: Secondary | ICD-10-CM | POA: Diagnosis not present

## 2021-03-27 DIAGNOSIS — M25561 Pain in right knee: Secondary | ICD-10-CM | POA: Diagnosis not present

## 2021-03-27 DIAGNOSIS — Z8673 Personal history of transient ischemic attack (TIA), and cerebral infarction without residual deficits: Secondary | ICD-10-CM | POA: Diagnosis not present

## 2021-03-27 DIAGNOSIS — R1312 Dysphagia, oropharyngeal phase: Secondary | ICD-10-CM | POA: Diagnosis not present

## 2021-03-27 DIAGNOSIS — R2689 Other abnormalities of gait and mobility: Secondary | ICD-10-CM | POA: Diagnosis not present

## 2021-03-27 DIAGNOSIS — R2681 Unsteadiness on feet: Secondary | ICD-10-CM | POA: Diagnosis not present

## 2021-03-27 DIAGNOSIS — M6281 Muscle weakness (generalized): Secondary | ICD-10-CM | POA: Diagnosis not present

## 2021-03-30 DIAGNOSIS — R269 Unspecified abnormalities of gait and mobility: Secondary | ICD-10-CM | POA: Diagnosis not present

## 2021-03-30 DIAGNOSIS — M1990 Unspecified osteoarthritis, unspecified site: Secondary | ICD-10-CM | POA: Diagnosis not present

## 2021-03-30 DIAGNOSIS — M6258 Muscle wasting and atrophy, not elsewhere classified, other site: Secondary | ICD-10-CM | POA: Diagnosis not present

## 2021-03-30 DIAGNOSIS — R1312 Dysphagia, oropharyngeal phase: Secondary | ICD-10-CM | POA: Diagnosis not present

## 2021-03-30 DIAGNOSIS — Z9181 History of falling: Secondary | ICD-10-CM | POA: Diagnosis not present

## 2021-03-30 DIAGNOSIS — M25561 Pain in right knee: Secondary | ICD-10-CM | POA: Diagnosis not present

## 2021-03-30 DIAGNOSIS — M6281 Muscle weakness (generalized): Secondary | ICD-10-CM | POA: Diagnosis not present

## 2021-03-30 DIAGNOSIS — R41841 Cognitive communication deficit: Secondary | ICD-10-CM | POA: Diagnosis not present

## 2021-03-30 DIAGNOSIS — R278 Other lack of coordination: Secondary | ICD-10-CM | POA: Diagnosis not present

## 2021-03-30 DIAGNOSIS — R498 Other voice and resonance disorders: Secondary | ICD-10-CM | POA: Diagnosis not present

## 2021-03-30 DIAGNOSIS — R2681 Unsteadiness on feet: Secondary | ICD-10-CM | POA: Diagnosis not present

## 2021-03-30 DIAGNOSIS — R2689 Other abnormalities of gait and mobility: Secondary | ICD-10-CM | POA: Diagnosis not present

## 2021-03-30 DIAGNOSIS — Z8673 Personal history of transient ischemic attack (TIA), and cerebral infarction without residual deficits: Secondary | ICD-10-CM | POA: Diagnosis not present

## 2021-03-31 DIAGNOSIS — R2689 Other abnormalities of gait and mobility: Secondary | ICD-10-CM | POA: Diagnosis not present

## 2021-03-31 DIAGNOSIS — R498 Other voice and resonance disorders: Secondary | ICD-10-CM | POA: Diagnosis not present

## 2021-03-31 DIAGNOSIS — M6258 Muscle wasting and atrophy, not elsewhere classified, other site: Secondary | ICD-10-CM | POA: Diagnosis not present

## 2021-03-31 DIAGNOSIS — Z9181 History of falling: Secondary | ICD-10-CM | POA: Diagnosis not present

## 2021-03-31 DIAGNOSIS — Z8673 Personal history of transient ischemic attack (TIA), and cerebral infarction without residual deficits: Secondary | ICD-10-CM | POA: Diagnosis not present

## 2021-03-31 DIAGNOSIS — R278 Other lack of coordination: Secondary | ICD-10-CM | POA: Diagnosis not present

## 2021-03-31 DIAGNOSIS — R41841 Cognitive communication deficit: Secondary | ICD-10-CM | POA: Diagnosis not present

## 2021-03-31 DIAGNOSIS — I700 Atherosclerosis of aorta: Secondary | ICD-10-CM | POA: Diagnosis not present

## 2021-03-31 DIAGNOSIS — M6281 Muscle weakness (generalized): Secondary | ICD-10-CM | POA: Diagnosis not present

## 2021-03-31 DIAGNOSIS — I5040 Unspecified combined systolic (congestive) and diastolic (congestive) heart failure: Secondary | ICD-10-CM | POA: Diagnosis not present

## 2021-03-31 DIAGNOSIS — R269 Unspecified abnormalities of gait and mobility: Secondary | ICD-10-CM | POA: Diagnosis not present

## 2021-03-31 DIAGNOSIS — I110 Hypertensive heart disease with heart failure: Secondary | ICD-10-CM | POA: Diagnosis not present

## 2021-03-31 DIAGNOSIS — M25561 Pain in right knee: Secondary | ICD-10-CM | POA: Diagnosis not present

## 2021-03-31 DIAGNOSIS — R2681 Unsteadiness on feet: Secondary | ICD-10-CM | POA: Diagnosis not present

## 2021-03-31 DIAGNOSIS — M1990 Unspecified osteoarthritis, unspecified site: Secondary | ICD-10-CM | POA: Diagnosis not present

## 2021-03-31 DIAGNOSIS — R1312 Dysphagia, oropharyngeal phase: Secondary | ICD-10-CM | POA: Diagnosis not present

## 2021-03-31 DIAGNOSIS — I2510 Atherosclerotic heart disease of native coronary artery without angina pectoris: Secondary | ICD-10-CM | POA: Diagnosis not present

## 2021-04-01 DIAGNOSIS — R269 Unspecified abnormalities of gait and mobility: Secondary | ICD-10-CM | POA: Diagnosis not present

## 2021-04-01 DIAGNOSIS — M6281 Muscle weakness (generalized): Secondary | ICD-10-CM | POA: Diagnosis not present

## 2021-04-01 DIAGNOSIS — R2689 Other abnormalities of gait and mobility: Secondary | ICD-10-CM | POA: Diagnosis not present

## 2021-04-01 DIAGNOSIS — Z8673 Personal history of transient ischemic attack (TIA), and cerebral infarction without residual deficits: Secondary | ICD-10-CM | POA: Diagnosis not present

## 2021-04-01 DIAGNOSIS — R41841 Cognitive communication deficit: Secondary | ICD-10-CM | POA: Diagnosis not present

## 2021-04-01 DIAGNOSIS — R498 Other voice and resonance disorders: Secondary | ICD-10-CM | POA: Diagnosis not present

## 2021-04-01 DIAGNOSIS — R278 Other lack of coordination: Secondary | ICD-10-CM | POA: Diagnosis not present

## 2021-04-01 DIAGNOSIS — M6258 Muscle wasting and atrophy, not elsewhere classified, other site: Secondary | ICD-10-CM | POA: Diagnosis not present

## 2021-04-01 DIAGNOSIS — R1312 Dysphagia, oropharyngeal phase: Secondary | ICD-10-CM | POA: Diagnosis not present

## 2021-04-01 DIAGNOSIS — M1990 Unspecified osteoarthritis, unspecified site: Secondary | ICD-10-CM | POA: Diagnosis not present

## 2021-04-01 DIAGNOSIS — I890 Lymphedema, not elsewhere classified: Secondary | ICD-10-CM | POA: Diagnosis not present

## 2021-04-01 DIAGNOSIS — R2681 Unsteadiness on feet: Secondary | ICD-10-CM | POA: Diagnosis not present

## 2021-04-01 DIAGNOSIS — Z9181 History of falling: Secondary | ICD-10-CM | POA: Diagnosis not present

## 2021-04-01 DIAGNOSIS — M25561 Pain in right knee: Secondary | ICD-10-CM | POA: Diagnosis not present

## 2021-04-02 DIAGNOSIS — R1312 Dysphagia, oropharyngeal phase: Secondary | ICD-10-CM | POA: Diagnosis not present

## 2021-04-02 DIAGNOSIS — M6258 Muscle wasting and atrophy, not elsewhere classified, other site: Secondary | ICD-10-CM | POA: Diagnosis not present

## 2021-04-02 DIAGNOSIS — R41841 Cognitive communication deficit: Secondary | ICD-10-CM | POA: Diagnosis not present

## 2021-04-02 DIAGNOSIS — R2681 Unsteadiness on feet: Secondary | ICD-10-CM | POA: Diagnosis not present

## 2021-04-02 DIAGNOSIS — R278 Other lack of coordination: Secondary | ICD-10-CM | POA: Diagnosis not present

## 2021-04-02 DIAGNOSIS — M6281 Muscle weakness (generalized): Secondary | ICD-10-CM | POA: Diagnosis not present

## 2021-04-02 DIAGNOSIS — M25561 Pain in right knee: Secondary | ICD-10-CM | POA: Diagnosis not present

## 2021-04-02 DIAGNOSIS — R2689 Other abnormalities of gait and mobility: Secondary | ICD-10-CM | POA: Diagnosis not present

## 2021-04-02 DIAGNOSIS — M1990 Unspecified osteoarthritis, unspecified site: Secondary | ICD-10-CM | POA: Diagnosis not present

## 2021-04-02 DIAGNOSIS — Z9181 History of falling: Secondary | ICD-10-CM | POA: Diagnosis not present

## 2021-04-02 DIAGNOSIS — R269 Unspecified abnormalities of gait and mobility: Secondary | ICD-10-CM | POA: Diagnosis not present

## 2021-04-02 DIAGNOSIS — R498 Other voice and resonance disorders: Secondary | ICD-10-CM | POA: Diagnosis not present

## 2021-04-02 DIAGNOSIS — Z8673 Personal history of transient ischemic attack (TIA), and cerebral infarction without residual deficits: Secondary | ICD-10-CM | POA: Diagnosis not present

## 2021-04-02 NOTE — Progress Notes (Signed)
Remote pacemaker transmission.   

## 2021-04-03 DIAGNOSIS — I5040 Unspecified combined systolic (congestive) and diastolic (congestive) heart failure: Secondary | ICD-10-CM | POA: Diagnosis not present

## 2021-04-03 DIAGNOSIS — F418 Other specified anxiety disorders: Secondary | ICD-10-CM | POA: Diagnosis not present

## 2021-04-06 DIAGNOSIS — E559 Vitamin D deficiency, unspecified: Secondary | ICD-10-CM | POA: Diagnosis not present

## 2021-04-06 DIAGNOSIS — R748 Abnormal levels of other serum enzymes: Secondary | ICD-10-CM | POA: Diagnosis not present

## 2021-04-06 DIAGNOSIS — E441 Mild protein-calorie malnutrition: Secondary | ICD-10-CM | POA: Diagnosis not present

## 2021-04-06 DIAGNOSIS — I700 Atherosclerosis of aorta: Secondary | ICD-10-CM | POA: Diagnosis not present

## 2021-04-06 DIAGNOSIS — E782 Mixed hyperlipidemia: Secondary | ICD-10-CM | POA: Diagnosis not present

## 2021-04-07 DIAGNOSIS — R41841 Cognitive communication deficit: Secondary | ICD-10-CM | POA: Diagnosis not present

## 2021-04-07 DIAGNOSIS — R2681 Unsteadiness on feet: Secondary | ICD-10-CM | POA: Diagnosis not present

## 2021-04-07 DIAGNOSIS — Z9181 History of falling: Secondary | ICD-10-CM | POA: Diagnosis not present

## 2021-04-07 DIAGNOSIS — M25561 Pain in right knee: Secondary | ICD-10-CM | POA: Diagnosis not present

## 2021-04-07 DIAGNOSIS — R498 Other voice and resonance disorders: Secondary | ICD-10-CM | POA: Diagnosis not present

## 2021-04-07 DIAGNOSIS — M6281 Muscle weakness (generalized): Secondary | ICD-10-CM | POA: Diagnosis not present

## 2021-04-07 DIAGNOSIS — M1990 Unspecified osteoarthritis, unspecified site: Secondary | ICD-10-CM | POA: Diagnosis not present

## 2021-04-07 DIAGNOSIS — R2689 Other abnormalities of gait and mobility: Secondary | ICD-10-CM | POA: Diagnosis not present

## 2021-04-07 DIAGNOSIS — M6258 Muscle wasting and atrophy, not elsewhere classified, other site: Secondary | ICD-10-CM | POA: Diagnosis not present

## 2021-04-07 DIAGNOSIS — R1312 Dysphagia, oropharyngeal phase: Secondary | ICD-10-CM | POA: Diagnosis not present

## 2021-04-07 DIAGNOSIS — R278 Other lack of coordination: Secondary | ICD-10-CM | POA: Diagnosis not present

## 2021-04-07 DIAGNOSIS — Z8673 Personal history of transient ischemic attack (TIA), and cerebral infarction without residual deficits: Secondary | ICD-10-CM | POA: Diagnosis not present

## 2021-04-07 DIAGNOSIS — R269 Unspecified abnormalities of gait and mobility: Secondary | ICD-10-CM | POA: Diagnosis not present

## 2021-04-08 DIAGNOSIS — Z8673 Personal history of transient ischemic attack (TIA), and cerebral infarction without residual deficits: Secondary | ICD-10-CM | POA: Diagnosis not present

## 2021-04-08 DIAGNOSIS — M6258 Muscle wasting and atrophy, not elsewhere classified, other site: Secondary | ICD-10-CM | POA: Diagnosis not present

## 2021-04-08 DIAGNOSIS — R278 Other lack of coordination: Secondary | ICD-10-CM | POA: Diagnosis not present

## 2021-04-08 DIAGNOSIS — Z9181 History of falling: Secondary | ICD-10-CM | POA: Diagnosis not present

## 2021-04-08 DIAGNOSIS — R498 Other voice and resonance disorders: Secondary | ICD-10-CM | POA: Diagnosis not present

## 2021-04-08 DIAGNOSIS — R269 Unspecified abnormalities of gait and mobility: Secondary | ICD-10-CM | POA: Diagnosis not present

## 2021-04-08 DIAGNOSIS — R2689 Other abnormalities of gait and mobility: Secondary | ICD-10-CM | POA: Diagnosis not present

## 2021-04-08 DIAGNOSIS — R2681 Unsteadiness on feet: Secondary | ICD-10-CM | POA: Diagnosis not present

## 2021-04-08 DIAGNOSIS — R1312 Dysphagia, oropharyngeal phase: Secondary | ICD-10-CM | POA: Diagnosis not present

## 2021-04-08 DIAGNOSIS — R41841 Cognitive communication deficit: Secondary | ICD-10-CM | POA: Diagnosis not present

## 2021-04-08 DIAGNOSIS — M25561 Pain in right knee: Secondary | ICD-10-CM | POA: Diagnosis not present

## 2021-04-08 DIAGNOSIS — M6281 Muscle weakness (generalized): Secondary | ICD-10-CM | POA: Diagnosis not present

## 2021-04-08 DIAGNOSIS — M1990 Unspecified osteoarthritis, unspecified site: Secondary | ICD-10-CM | POA: Diagnosis not present

## 2021-04-09 DIAGNOSIS — M25561 Pain in right knee: Secondary | ICD-10-CM | POA: Diagnosis not present

## 2021-04-09 DIAGNOSIS — Z79899 Other long term (current) drug therapy: Secondary | ICD-10-CM | POA: Diagnosis not present

## 2021-04-09 DIAGNOSIS — R278 Other lack of coordination: Secondary | ICD-10-CM | POA: Diagnosis not present

## 2021-04-09 DIAGNOSIS — Z8673 Personal history of transient ischemic attack (TIA), and cerebral infarction without residual deficits: Secondary | ICD-10-CM | POA: Diagnosis not present

## 2021-04-09 DIAGNOSIS — R2689 Other abnormalities of gait and mobility: Secondary | ICD-10-CM | POA: Diagnosis not present

## 2021-04-09 DIAGNOSIS — R2681 Unsteadiness on feet: Secondary | ICD-10-CM | POA: Diagnosis not present

## 2021-04-09 DIAGNOSIS — I110 Hypertensive heart disease with heart failure: Secondary | ICD-10-CM | POA: Diagnosis not present

## 2021-04-09 DIAGNOSIS — M6258 Muscle wasting and atrophy, not elsewhere classified, other site: Secondary | ICD-10-CM | POA: Diagnosis not present

## 2021-04-09 DIAGNOSIS — M1990 Unspecified osteoarthritis, unspecified site: Secondary | ICD-10-CM | POA: Diagnosis not present

## 2021-04-09 DIAGNOSIS — I480 Paroxysmal atrial fibrillation: Secondary | ICD-10-CM | POA: Diagnosis not present

## 2021-04-09 DIAGNOSIS — M6281 Muscle weakness (generalized): Secondary | ICD-10-CM | POA: Diagnosis not present

## 2021-04-09 DIAGNOSIS — I5040 Unspecified combined systolic (congestive) and diastolic (congestive) heart failure: Secondary | ICD-10-CM | POA: Diagnosis not present

## 2021-04-09 DIAGNOSIS — R1312 Dysphagia, oropharyngeal phase: Secondary | ICD-10-CM | POA: Diagnosis not present

## 2021-04-09 DIAGNOSIS — R269 Unspecified abnormalities of gait and mobility: Secondary | ICD-10-CM | POA: Diagnosis not present

## 2021-04-09 DIAGNOSIS — R41841 Cognitive communication deficit: Secondary | ICD-10-CM | POA: Diagnosis not present

## 2021-04-09 DIAGNOSIS — R498 Other voice and resonance disorders: Secondary | ICD-10-CM | POA: Diagnosis not present

## 2021-04-09 DIAGNOSIS — Z9181 History of falling: Secondary | ICD-10-CM | POA: Diagnosis not present

## 2021-04-10 DIAGNOSIS — Z8673 Personal history of transient ischemic attack (TIA), and cerebral infarction without residual deficits: Secondary | ICD-10-CM | POA: Diagnosis not present

## 2021-04-10 DIAGNOSIS — R498 Other voice and resonance disorders: Secondary | ICD-10-CM | POA: Diagnosis not present

## 2021-04-10 DIAGNOSIS — R1312 Dysphagia, oropharyngeal phase: Secondary | ICD-10-CM | POA: Diagnosis not present

## 2021-04-10 DIAGNOSIS — R41841 Cognitive communication deficit: Secondary | ICD-10-CM | POA: Diagnosis not present

## 2021-04-10 DIAGNOSIS — R269 Unspecified abnormalities of gait and mobility: Secondary | ICD-10-CM | POA: Diagnosis not present

## 2021-04-10 DIAGNOSIS — R278 Other lack of coordination: Secondary | ICD-10-CM | POA: Diagnosis not present

## 2021-04-10 DIAGNOSIS — Z9181 History of falling: Secondary | ICD-10-CM | POA: Diagnosis not present

## 2021-04-10 DIAGNOSIS — M1990 Unspecified osteoarthritis, unspecified site: Secondary | ICD-10-CM | POA: Diagnosis not present

## 2021-04-10 DIAGNOSIS — R2689 Other abnormalities of gait and mobility: Secondary | ICD-10-CM | POA: Diagnosis not present

## 2021-04-10 DIAGNOSIS — M25561 Pain in right knee: Secondary | ICD-10-CM | POA: Diagnosis not present

## 2021-04-10 DIAGNOSIS — M6281 Muscle weakness (generalized): Secondary | ICD-10-CM | POA: Diagnosis not present

## 2021-04-10 DIAGNOSIS — R2681 Unsteadiness on feet: Secondary | ICD-10-CM | POA: Diagnosis not present

## 2021-04-10 DIAGNOSIS — M6258 Muscle wasting and atrophy, not elsewhere classified, other site: Secondary | ICD-10-CM | POA: Diagnosis not present

## 2021-04-11 DIAGNOSIS — M25561 Pain in right knee: Secondary | ICD-10-CM | POA: Diagnosis not present

## 2021-04-11 DIAGNOSIS — R278 Other lack of coordination: Secondary | ICD-10-CM | POA: Diagnosis not present

## 2021-04-11 DIAGNOSIS — R41841 Cognitive communication deficit: Secondary | ICD-10-CM | POA: Diagnosis not present

## 2021-04-11 DIAGNOSIS — M6281 Muscle weakness (generalized): Secondary | ICD-10-CM | POA: Diagnosis not present

## 2021-04-11 DIAGNOSIS — R498 Other voice and resonance disorders: Secondary | ICD-10-CM | POA: Diagnosis not present

## 2021-04-11 DIAGNOSIS — R1312 Dysphagia, oropharyngeal phase: Secondary | ICD-10-CM | POA: Diagnosis not present

## 2021-04-11 DIAGNOSIS — R269 Unspecified abnormalities of gait and mobility: Secondary | ICD-10-CM | POA: Diagnosis not present

## 2021-04-11 DIAGNOSIS — M6258 Muscle wasting and atrophy, not elsewhere classified, other site: Secondary | ICD-10-CM | POA: Diagnosis not present

## 2021-04-11 DIAGNOSIS — Z8673 Personal history of transient ischemic attack (TIA), and cerebral infarction without residual deficits: Secondary | ICD-10-CM | POA: Diagnosis not present

## 2021-04-11 DIAGNOSIS — R2681 Unsteadiness on feet: Secondary | ICD-10-CM | POA: Diagnosis not present

## 2021-04-11 DIAGNOSIS — R2689 Other abnormalities of gait and mobility: Secondary | ICD-10-CM | POA: Diagnosis not present

## 2021-04-11 DIAGNOSIS — Z9181 History of falling: Secondary | ICD-10-CM | POA: Diagnosis not present

## 2021-04-11 DIAGNOSIS — M1990 Unspecified osteoarthritis, unspecified site: Secondary | ICD-10-CM | POA: Diagnosis not present

## 2021-04-13 DIAGNOSIS — F411 Generalized anxiety disorder: Secondary | ICD-10-CM | POA: Diagnosis not present

## 2021-04-13 DIAGNOSIS — R269 Unspecified abnormalities of gait and mobility: Secondary | ICD-10-CM | POA: Diagnosis not present

## 2021-04-13 DIAGNOSIS — R498 Other voice and resonance disorders: Secondary | ICD-10-CM | POA: Diagnosis not present

## 2021-04-13 DIAGNOSIS — M6281 Muscle weakness (generalized): Secondary | ICD-10-CM | POA: Diagnosis not present

## 2021-04-13 DIAGNOSIS — F331 Major depressive disorder, recurrent, moderate: Secondary | ICD-10-CM | POA: Diagnosis not present

## 2021-04-13 DIAGNOSIS — Z8673 Personal history of transient ischemic attack (TIA), and cerebral infarction without residual deficits: Secondary | ICD-10-CM | POA: Diagnosis not present

## 2021-04-13 DIAGNOSIS — M6258 Muscle wasting and atrophy, not elsewhere classified, other site: Secondary | ICD-10-CM | POA: Diagnosis not present

## 2021-04-13 DIAGNOSIS — M25561 Pain in right knee: Secondary | ICD-10-CM | POA: Diagnosis not present

## 2021-04-13 DIAGNOSIS — Z9181 History of falling: Secondary | ICD-10-CM | POA: Diagnosis not present

## 2021-04-13 DIAGNOSIS — M1990 Unspecified osteoarthritis, unspecified site: Secondary | ICD-10-CM | POA: Diagnosis not present

## 2021-04-13 DIAGNOSIS — R278 Other lack of coordination: Secondary | ICD-10-CM | POA: Diagnosis not present

## 2021-04-13 DIAGNOSIS — R2681 Unsteadiness on feet: Secondary | ICD-10-CM | POA: Diagnosis not present

## 2021-04-13 DIAGNOSIS — R41841 Cognitive communication deficit: Secondary | ICD-10-CM | POA: Diagnosis not present

## 2021-04-13 DIAGNOSIS — R2689 Other abnormalities of gait and mobility: Secondary | ICD-10-CM | POA: Diagnosis not present

## 2021-04-13 DIAGNOSIS — R1312 Dysphagia, oropharyngeal phase: Secondary | ICD-10-CM | POA: Diagnosis not present

## 2021-04-14 DIAGNOSIS — Z8673 Personal history of transient ischemic attack (TIA), and cerebral infarction without residual deficits: Secondary | ICD-10-CM | POA: Diagnosis not present

## 2021-04-14 DIAGNOSIS — R2689 Other abnormalities of gait and mobility: Secondary | ICD-10-CM | POA: Diagnosis not present

## 2021-04-14 DIAGNOSIS — Z9181 History of falling: Secondary | ICD-10-CM | POA: Diagnosis not present

## 2021-04-14 DIAGNOSIS — M1990 Unspecified osteoarthritis, unspecified site: Secondary | ICD-10-CM | POA: Diagnosis not present

## 2021-04-14 DIAGNOSIS — R269 Unspecified abnormalities of gait and mobility: Secondary | ICD-10-CM | POA: Diagnosis not present

## 2021-04-14 DIAGNOSIS — R2681 Unsteadiness on feet: Secondary | ICD-10-CM | POA: Diagnosis not present

## 2021-04-14 DIAGNOSIS — M6281 Muscle weakness (generalized): Secondary | ICD-10-CM | POA: Diagnosis not present

## 2021-04-14 DIAGNOSIS — R1312 Dysphagia, oropharyngeal phase: Secondary | ICD-10-CM | POA: Diagnosis not present

## 2021-04-14 DIAGNOSIS — R498 Other voice and resonance disorders: Secondary | ICD-10-CM | POA: Diagnosis not present

## 2021-04-14 DIAGNOSIS — M25561 Pain in right knee: Secondary | ICD-10-CM | POA: Diagnosis not present

## 2021-04-14 DIAGNOSIS — R278 Other lack of coordination: Secondary | ICD-10-CM | POA: Diagnosis not present

## 2021-04-14 DIAGNOSIS — R41841 Cognitive communication deficit: Secondary | ICD-10-CM | POA: Diagnosis not present

## 2021-04-14 DIAGNOSIS — M6258 Muscle wasting and atrophy, not elsewhere classified, other site: Secondary | ICD-10-CM | POA: Diagnosis not present

## 2021-04-15 DIAGNOSIS — Z9181 History of falling: Secondary | ICD-10-CM | POA: Diagnosis not present

## 2021-04-15 DIAGNOSIS — R41841 Cognitive communication deficit: Secondary | ICD-10-CM | POA: Diagnosis not present

## 2021-04-15 DIAGNOSIS — M6258 Muscle wasting and atrophy, not elsewhere classified, other site: Secondary | ICD-10-CM | POA: Diagnosis not present

## 2021-04-15 DIAGNOSIS — M25561 Pain in right knee: Secondary | ICD-10-CM | POA: Diagnosis not present

## 2021-04-15 DIAGNOSIS — Z8673 Personal history of transient ischemic attack (TIA), and cerebral infarction without residual deficits: Secondary | ICD-10-CM | POA: Diagnosis not present

## 2021-04-15 DIAGNOSIS — R2689 Other abnormalities of gait and mobility: Secondary | ICD-10-CM | POA: Diagnosis not present

## 2021-04-15 DIAGNOSIS — R278 Other lack of coordination: Secondary | ICD-10-CM | POA: Diagnosis not present

## 2021-04-15 DIAGNOSIS — R1312 Dysphagia, oropharyngeal phase: Secondary | ICD-10-CM | POA: Diagnosis not present

## 2021-04-15 DIAGNOSIS — M1990 Unspecified osteoarthritis, unspecified site: Secondary | ICD-10-CM | POA: Diagnosis not present

## 2021-04-15 DIAGNOSIS — M6281 Muscle weakness (generalized): Secondary | ICD-10-CM | POA: Diagnosis not present

## 2021-04-15 DIAGNOSIS — R2681 Unsteadiness on feet: Secondary | ICD-10-CM | POA: Diagnosis not present

## 2021-04-15 DIAGNOSIS — R498 Other voice and resonance disorders: Secondary | ICD-10-CM | POA: Diagnosis not present

## 2021-04-15 DIAGNOSIS — R269 Unspecified abnormalities of gait and mobility: Secondary | ICD-10-CM | POA: Diagnosis not present

## 2021-04-16 DIAGNOSIS — R2681 Unsteadiness on feet: Secondary | ICD-10-CM | POA: Diagnosis not present

## 2021-04-16 DIAGNOSIS — M6281 Muscle weakness (generalized): Secondary | ICD-10-CM | POA: Diagnosis not present

## 2021-04-16 DIAGNOSIS — M25561 Pain in right knee: Secondary | ICD-10-CM | POA: Diagnosis not present

## 2021-04-16 DIAGNOSIS — M6258 Muscle wasting and atrophy, not elsewhere classified, other site: Secondary | ICD-10-CM | POA: Diagnosis not present

## 2021-04-16 DIAGNOSIS — Z8673 Personal history of transient ischemic attack (TIA), and cerebral infarction without residual deficits: Secondary | ICD-10-CM | POA: Diagnosis not present

## 2021-04-16 DIAGNOSIS — R1312 Dysphagia, oropharyngeal phase: Secondary | ICD-10-CM | POA: Diagnosis not present

## 2021-04-16 DIAGNOSIS — Z9181 History of falling: Secondary | ICD-10-CM | POA: Diagnosis not present

## 2021-04-16 DIAGNOSIS — R2689 Other abnormalities of gait and mobility: Secondary | ICD-10-CM | POA: Diagnosis not present

## 2021-04-16 DIAGNOSIS — R278 Other lack of coordination: Secondary | ICD-10-CM | POA: Diagnosis not present

## 2021-04-16 DIAGNOSIS — M1990 Unspecified osteoarthritis, unspecified site: Secondary | ICD-10-CM | POA: Diagnosis not present

## 2021-04-16 DIAGNOSIS — R498 Other voice and resonance disorders: Secondary | ICD-10-CM | POA: Diagnosis not present

## 2021-04-16 DIAGNOSIS — R269 Unspecified abnormalities of gait and mobility: Secondary | ICD-10-CM | POA: Diagnosis not present

## 2021-04-16 DIAGNOSIS — R41841 Cognitive communication deficit: Secondary | ICD-10-CM | POA: Diagnosis not present

## 2021-04-17 DIAGNOSIS — R269 Unspecified abnormalities of gait and mobility: Secondary | ICD-10-CM | POA: Diagnosis not present

## 2021-04-17 DIAGNOSIS — R498 Other voice and resonance disorders: Secondary | ICD-10-CM | POA: Diagnosis not present

## 2021-04-17 DIAGNOSIS — Z8673 Personal history of transient ischemic attack (TIA), and cerebral infarction without residual deficits: Secondary | ICD-10-CM | POA: Diagnosis not present

## 2021-04-17 DIAGNOSIS — R2689 Other abnormalities of gait and mobility: Secondary | ICD-10-CM | POA: Diagnosis not present

## 2021-04-17 DIAGNOSIS — R1312 Dysphagia, oropharyngeal phase: Secondary | ICD-10-CM | POA: Diagnosis not present

## 2021-04-17 DIAGNOSIS — M1990 Unspecified osteoarthritis, unspecified site: Secondary | ICD-10-CM | POA: Diagnosis not present

## 2021-04-17 DIAGNOSIS — Z9181 History of falling: Secondary | ICD-10-CM | POA: Diagnosis not present

## 2021-04-17 DIAGNOSIS — M6258 Muscle wasting and atrophy, not elsewhere classified, other site: Secondary | ICD-10-CM | POA: Diagnosis not present

## 2021-04-17 DIAGNOSIS — R278 Other lack of coordination: Secondary | ICD-10-CM | POA: Diagnosis not present

## 2021-04-17 DIAGNOSIS — R41841 Cognitive communication deficit: Secondary | ICD-10-CM | POA: Diagnosis not present

## 2021-04-17 DIAGNOSIS — R2681 Unsteadiness on feet: Secondary | ICD-10-CM | POA: Diagnosis not present

## 2021-04-17 DIAGNOSIS — M6281 Muscle weakness (generalized): Secondary | ICD-10-CM | POA: Diagnosis not present

## 2021-04-17 DIAGNOSIS — M25561 Pain in right knee: Secondary | ICD-10-CM | POA: Diagnosis not present

## 2021-04-18 DIAGNOSIS — M1990 Unspecified osteoarthritis, unspecified site: Secondary | ICD-10-CM | POA: Diagnosis not present

## 2021-04-18 DIAGNOSIS — R41841 Cognitive communication deficit: Secondary | ICD-10-CM | POA: Diagnosis not present

## 2021-04-18 DIAGNOSIS — R269 Unspecified abnormalities of gait and mobility: Secondary | ICD-10-CM | POA: Diagnosis not present

## 2021-04-18 DIAGNOSIS — M6281 Muscle weakness (generalized): Secondary | ICD-10-CM | POA: Diagnosis not present

## 2021-04-18 DIAGNOSIS — R2689 Other abnormalities of gait and mobility: Secondary | ICD-10-CM | POA: Diagnosis not present

## 2021-04-18 DIAGNOSIS — R2681 Unsteadiness on feet: Secondary | ICD-10-CM | POA: Diagnosis not present

## 2021-04-18 DIAGNOSIS — R1312 Dysphagia, oropharyngeal phase: Secondary | ICD-10-CM | POA: Diagnosis not present

## 2021-04-18 DIAGNOSIS — R498 Other voice and resonance disorders: Secondary | ICD-10-CM | POA: Diagnosis not present

## 2021-04-18 DIAGNOSIS — R278 Other lack of coordination: Secondary | ICD-10-CM | POA: Diagnosis not present

## 2021-04-18 DIAGNOSIS — M6258 Muscle wasting and atrophy, not elsewhere classified, other site: Secondary | ICD-10-CM | POA: Diagnosis not present

## 2021-04-18 DIAGNOSIS — M25561 Pain in right knee: Secondary | ICD-10-CM | POA: Diagnosis not present

## 2021-04-18 DIAGNOSIS — Z9181 History of falling: Secondary | ICD-10-CM | POA: Diagnosis not present

## 2021-04-18 DIAGNOSIS — Z8673 Personal history of transient ischemic attack (TIA), and cerebral infarction without residual deficits: Secondary | ICD-10-CM | POA: Diagnosis not present

## 2021-04-20 DIAGNOSIS — Z8673 Personal history of transient ischemic attack (TIA), and cerebral infarction without residual deficits: Secondary | ICD-10-CM | POA: Diagnosis not present

## 2021-04-20 DIAGNOSIS — R41841 Cognitive communication deficit: Secondary | ICD-10-CM | POA: Diagnosis not present

## 2021-04-20 DIAGNOSIS — M6258 Muscle wasting and atrophy, not elsewhere classified, other site: Secondary | ICD-10-CM | POA: Diagnosis not present

## 2021-04-20 DIAGNOSIS — R278 Other lack of coordination: Secondary | ICD-10-CM | POA: Diagnosis not present

## 2021-04-20 DIAGNOSIS — R2681 Unsteadiness on feet: Secondary | ICD-10-CM | POA: Diagnosis not present

## 2021-04-20 DIAGNOSIS — R498 Other voice and resonance disorders: Secondary | ICD-10-CM | POA: Diagnosis not present

## 2021-04-20 DIAGNOSIS — Z9181 History of falling: Secondary | ICD-10-CM | POA: Diagnosis not present

## 2021-04-20 DIAGNOSIS — M25561 Pain in right knee: Secondary | ICD-10-CM | POA: Diagnosis not present

## 2021-04-20 DIAGNOSIS — M6281 Muscle weakness (generalized): Secondary | ICD-10-CM | POA: Diagnosis not present

## 2021-04-20 DIAGNOSIS — R2689 Other abnormalities of gait and mobility: Secondary | ICD-10-CM | POA: Diagnosis not present

## 2021-04-20 DIAGNOSIS — M1990 Unspecified osteoarthritis, unspecified site: Secondary | ICD-10-CM | POA: Diagnosis not present

## 2021-04-20 DIAGNOSIS — R269 Unspecified abnormalities of gait and mobility: Secondary | ICD-10-CM | POA: Diagnosis not present

## 2021-04-20 DIAGNOSIS — R1312 Dysphagia, oropharyngeal phase: Secondary | ICD-10-CM | POA: Diagnosis not present

## 2021-04-21 DIAGNOSIS — R41841 Cognitive communication deficit: Secondary | ICD-10-CM | POA: Diagnosis not present

## 2021-04-21 DIAGNOSIS — Z9181 History of falling: Secondary | ICD-10-CM | POA: Diagnosis not present

## 2021-04-21 DIAGNOSIS — R2681 Unsteadiness on feet: Secondary | ICD-10-CM | POA: Diagnosis not present

## 2021-04-21 DIAGNOSIS — Z8673 Personal history of transient ischemic attack (TIA), and cerebral infarction without residual deficits: Secondary | ICD-10-CM | POA: Diagnosis not present

## 2021-04-21 DIAGNOSIS — M6281 Muscle weakness (generalized): Secondary | ICD-10-CM | POA: Diagnosis not present

## 2021-04-21 DIAGNOSIS — R1312 Dysphagia, oropharyngeal phase: Secondary | ICD-10-CM | POA: Diagnosis not present

## 2021-04-21 DIAGNOSIS — R278 Other lack of coordination: Secondary | ICD-10-CM | POA: Diagnosis not present

## 2021-04-21 DIAGNOSIS — M25561 Pain in right knee: Secondary | ICD-10-CM | POA: Diagnosis not present

## 2021-04-21 DIAGNOSIS — R269 Unspecified abnormalities of gait and mobility: Secondary | ICD-10-CM | POA: Diagnosis not present

## 2021-04-21 DIAGNOSIS — M6258 Muscle wasting and atrophy, not elsewhere classified, other site: Secondary | ICD-10-CM | POA: Diagnosis not present

## 2021-04-21 DIAGNOSIS — R2689 Other abnormalities of gait and mobility: Secondary | ICD-10-CM | POA: Diagnosis not present

## 2021-04-21 DIAGNOSIS — R498 Other voice and resonance disorders: Secondary | ICD-10-CM | POA: Diagnosis not present

## 2021-04-21 DIAGNOSIS — M1990 Unspecified osteoarthritis, unspecified site: Secondary | ICD-10-CM | POA: Diagnosis not present

## 2021-04-22 DIAGNOSIS — Z8673 Personal history of transient ischemic attack (TIA), and cerebral infarction without residual deficits: Secondary | ICD-10-CM | POA: Diagnosis not present

## 2021-04-22 DIAGNOSIS — R269 Unspecified abnormalities of gait and mobility: Secondary | ICD-10-CM | POA: Diagnosis not present

## 2021-04-22 DIAGNOSIS — M6258 Muscle wasting and atrophy, not elsewhere classified, other site: Secondary | ICD-10-CM | POA: Diagnosis not present

## 2021-04-22 DIAGNOSIS — R41841 Cognitive communication deficit: Secondary | ICD-10-CM | POA: Diagnosis not present

## 2021-04-22 DIAGNOSIS — M1990 Unspecified osteoarthritis, unspecified site: Secondary | ICD-10-CM | POA: Diagnosis not present

## 2021-04-22 DIAGNOSIS — M25561 Pain in right knee: Secondary | ICD-10-CM | POA: Diagnosis not present

## 2021-04-22 DIAGNOSIS — R498 Other voice and resonance disorders: Secondary | ICD-10-CM | POA: Diagnosis not present

## 2021-04-22 DIAGNOSIS — M6281 Muscle weakness (generalized): Secondary | ICD-10-CM | POA: Diagnosis not present

## 2021-04-22 DIAGNOSIS — R2689 Other abnormalities of gait and mobility: Secondary | ICD-10-CM | POA: Diagnosis not present

## 2021-04-22 DIAGNOSIS — Z9181 History of falling: Secondary | ICD-10-CM | POA: Diagnosis not present

## 2021-04-22 DIAGNOSIS — R2681 Unsteadiness on feet: Secondary | ICD-10-CM | POA: Diagnosis not present

## 2021-04-22 DIAGNOSIS — R278 Other lack of coordination: Secondary | ICD-10-CM | POA: Diagnosis not present

## 2021-04-22 DIAGNOSIS — R1312 Dysphagia, oropharyngeal phase: Secondary | ICD-10-CM | POA: Diagnosis not present

## 2021-04-23 DIAGNOSIS — R278 Other lack of coordination: Secondary | ICD-10-CM | POA: Diagnosis not present

## 2021-04-23 DIAGNOSIS — M1990 Unspecified osteoarthritis, unspecified site: Secondary | ICD-10-CM | POA: Diagnosis not present

## 2021-04-23 DIAGNOSIS — M25561 Pain in right knee: Secondary | ICD-10-CM | POA: Diagnosis not present

## 2021-04-23 DIAGNOSIS — M6281 Muscle weakness (generalized): Secondary | ICD-10-CM | POA: Diagnosis not present

## 2021-04-23 DIAGNOSIS — R269 Unspecified abnormalities of gait and mobility: Secondary | ICD-10-CM | POA: Diagnosis not present

## 2021-04-23 DIAGNOSIS — M6258 Muscle wasting and atrophy, not elsewhere classified, other site: Secondary | ICD-10-CM | POA: Diagnosis not present

## 2021-04-23 DIAGNOSIS — Z8673 Personal history of transient ischemic attack (TIA), and cerebral infarction without residual deficits: Secondary | ICD-10-CM | POA: Diagnosis not present

## 2021-04-23 DIAGNOSIS — R2681 Unsteadiness on feet: Secondary | ICD-10-CM | POA: Diagnosis not present

## 2021-04-23 DIAGNOSIS — Z9181 History of falling: Secondary | ICD-10-CM | POA: Diagnosis not present

## 2021-04-23 DIAGNOSIS — R41841 Cognitive communication deficit: Secondary | ICD-10-CM | POA: Diagnosis not present

## 2021-04-23 DIAGNOSIS — R2689 Other abnormalities of gait and mobility: Secondary | ICD-10-CM | POA: Diagnosis not present

## 2021-04-23 DIAGNOSIS — R498 Other voice and resonance disorders: Secondary | ICD-10-CM | POA: Diagnosis not present

## 2021-04-23 DIAGNOSIS — R1312 Dysphagia, oropharyngeal phase: Secondary | ICD-10-CM | POA: Diagnosis not present

## 2021-04-24 DIAGNOSIS — R1312 Dysphagia, oropharyngeal phase: Secondary | ICD-10-CM | POA: Diagnosis not present

## 2021-04-24 DIAGNOSIS — R269 Unspecified abnormalities of gait and mobility: Secondary | ICD-10-CM | POA: Diagnosis not present

## 2021-04-24 DIAGNOSIS — R2681 Unsteadiness on feet: Secondary | ICD-10-CM | POA: Diagnosis not present

## 2021-04-24 DIAGNOSIS — M25561 Pain in right knee: Secondary | ICD-10-CM | POA: Diagnosis not present

## 2021-04-24 DIAGNOSIS — M6281 Muscle weakness (generalized): Secondary | ICD-10-CM | POA: Diagnosis not present

## 2021-04-24 DIAGNOSIS — Z9181 History of falling: Secondary | ICD-10-CM | POA: Diagnosis not present

## 2021-04-24 DIAGNOSIS — R41841 Cognitive communication deficit: Secondary | ICD-10-CM | POA: Diagnosis not present

## 2021-04-24 DIAGNOSIS — R278 Other lack of coordination: Secondary | ICD-10-CM | POA: Diagnosis not present

## 2021-04-24 DIAGNOSIS — M1990 Unspecified osteoarthritis, unspecified site: Secondary | ICD-10-CM | POA: Diagnosis not present

## 2021-04-24 DIAGNOSIS — Z8673 Personal history of transient ischemic attack (TIA), and cerebral infarction without residual deficits: Secondary | ICD-10-CM | POA: Diagnosis not present

## 2021-04-24 DIAGNOSIS — R498 Other voice and resonance disorders: Secondary | ICD-10-CM | POA: Diagnosis not present

## 2021-04-24 DIAGNOSIS — M6258 Muscle wasting and atrophy, not elsewhere classified, other site: Secondary | ICD-10-CM | POA: Diagnosis not present

## 2021-04-24 DIAGNOSIS — R2689 Other abnormalities of gait and mobility: Secondary | ICD-10-CM | POA: Diagnosis not present

## 2021-04-25 DIAGNOSIS — R498 Other voice and resonance disorders: Secondary | ICD-10-CM | POA: Diagnosis not present

## 2021-04-25 DIAGNOSIS — R269 Unspecified abnormalities of gait and mobility: Secondary | ICD-10-CM | POA: Diagnosis not present

## 2021-04-25 DIAGNOSIS — Z9181 History of falling: Secondary | ICD-10-CM | POA: Diagnosis not present

## 2021-04-25 DIAGNOSIS — R1312 Dysphagia, oropharyngeal phase: Secondary | ICD-10-CM | POA: Diagnosis not present

## 2021-04-25 DIAGNOSIS — M25561 Pain in right knee: Secondary | ICD-10-CM | POA: Diagnosis not present

## 2021-04-25 DIAGNOSIS — R278 Other lack of coordination: Secondary | ICD-10-CM | POA: Diagnosis not present

## 2021-04-25 DIAGNOSIS — R41841 Cognitive communication deficit: Secondary | ICD-10-CM | POA: Diagnosis not present

## 2021-04-25 DIAGNOSIS — M6281 Muscle weakness (generalized): Secondary | ICD-10-CM | POA: Diagnosis not present

## 2021-04-25 DIAGNOSIS — R2689 Other abnormalities of gait and mobility: Secondary | ICD-10-CM | POA: Diagnosis not present

## 2021-04-25 DIAGNOSIS — R2681 Unsteadiness on feet: Secondary | ICD-10-CM | POA: Diagnosis not present

## 2021-04-25 DIAGNOSIS — M1990 Unspecified osteoarthritis, unspecified site: Secondary | ICD-10-CM | POA: Diagnosis not present

## 2021-04-25 DIAGNOSIS — M6258 Muscle wasting and atrophy, not elsewhere classified, other site: Secondary | ICD-10-CM | POA: Diagnosis not present

## 2021-04-25 DIAGNOSIS — Z8673 Personal history of transient ischemic attack (TIA), and cerebral infarction without residual deficits: Secondary | ICD-10-CM | POA: Diagnosis not present

## 2021-04-27 DIAGNOSIS — R1312 Dysphagia, oropharyngeal phase: Secondary | ICD-10-CM | POA: Diagnosis not present

## 2021-04-27 DIAGNOSIS — R2681 Unsteadiness on feet: Secondary | ICD-10-CM | POA: Diagnosis not present

## 2021-04-27 DIAGNOSIS — Z9181 History of falling: Secondary | ICD-10-CM | POA: Diagnosis not present

## 2021-04-27 DIAGNOSIS — R498 Other voice and resonance disorders: Secondary | ICD-10-CM | POA: Diagnosis not present

## 2021-04-27 DIAGNOSIS — M1990 Unspecified osteoarthritis, unspecified site: Secondary | ICD-10-CM | POA: Diagnosis not present

## 2021-04-27 DIAGNOSIS — R278 Other lack of coordination: Secondary | ICD-10-CM | POA: Diagnosis not present

## 2021-04-27 DIAGNOSIS — M6258 Muscle wasting and atrophy, not elsewhere classified, other site: Secondary | ICD-10-CM | POA: Diagnosis not present

## 2021-04-27 DIAGNOSIS — M6281 Muscle weakness (generalized): Secondary | ICD-10-CM | POA: Diagnosis not present

## 2021-04-27 DIAGNOSIS — Z8673 Personal history of transient ischemic attack (TIA), and cerebral infarction without residual deficits: Secondary | ICD-10-CM | POA: Diagnosis not present

## 2021-04-27 DIAGNOSIS — M25561 Pain in right knee: Secondary | ICD-10-CM | POA: Diagnosis not present

## 2021-04-27 DIAGNOSIS — R269 Unspecified abnormalities of gait and mobility: Secondary | ICD-10-CM | POA: Diagnosis not present

## 2021-04-27 DIAGNOSIS — R2689 Other abnormalities of gait and mobility: Secondary | ICD-10-CM | POA: Diagnosis not present

## 2021-04-27 DIAGNOSIS — R41841 Cognitive communication deficit: Secondary | ICD-10-CM | POA: Diagnosis not present

## 2021-04-28 DIAGNOSIS — Z9181 History of falling: Secondary | ICD-10-CM | POA: Diagnosis not present

## 2021-04-28 DIAGNOSIS — M1990 Unspecified osteoarthritis, unspecified site: Secondary | ICD-10-CM | POA: Diagnosis not present

## 2021-04-28 DIAGNOSIS — R269 Unspecified abnormalities of gait and mobility: Secondary | ICD-10-CM | POA: Diagnosis not present

## 2021-04-28 DIAGNOSIS — R41841 Cognitive communication deficit: Secondary | ICD-10-CM | POA: Diagnosis not present

## 2021-04-28 DIAGNOSIS — R634 Abnormal weight loss: Secondary | ICD-10-CM | POA: Diagnosis not present

## 2021-04-28 DIAGNOSIS — R278 Other lack of coordination: Secondary | ICD-10-CM | POA: Diagnosis not present

## 2021-04-28 DIAGNOSIS — Z8673 Personal history of transient ischemic attack (TIA), and cerebral infarction without residual deficits: Secondary | ICD-10-CM | POA: Diagnosis not present

## 2021-04-28 DIAGNOSIS — M25561 Pain in right knee: Secondary | ICD-10-CM | POA: Diagnosis not present

## 2021-04-28 DIAGNOSIS — R2689 Other abnormalities of gait and mobility: Secondary | ICD-10-CM | POA: Diagnosis not present

## 2021-04-28 DIAGNOSIS — I5040 Unspecified combined systolic (congestive) and diastolic (congestive) heart failure: Secondary | ICD-10-CM | POA: Diagnosis not present

## 2021-04-28 DIAGNOSIS — M6281 Muscle weakness (generalized): Secondary | ICD-10-CM | POA: Diagnosis not present

## 2021-04-28 DIAGNOSIS — R2681 Unsteadiness on feet: Secondary | ICD-10-CM | POA: Diagnosis not present

## 2021-04-28 DIAGNOSIS — R498 Other voice and resonance disorders: Secondary | ICD-10-CM | POA: Diagnosis not present

## 2021-04-28 DIAGNOSIS — R1312 Dysphagia, oropharyngeal phase: Secondary | ICD-10-CM | POA: Diagnosis not present

## 2021-04-28 DIAGNOSIS — R630 Anorexia: Secondary | ICD-10-CM | POA: Diagnosis not present

## 2021-04-28 DIAGNOSIS — M6258 Muscle wasting and atrophy, not elsewhere classified, other site: Secondary | ICD-10-CM | POA: Diagnosis not present

## 2021-04-29 DIAGNOSIS — R2681 Unsteadiness on feet: Secondary | ICD-10-CM | POA: Diagnosis not present

## 2021-04-29 DIAGNOSIS — R1312 Dysphagia, oropharyngeal phase: Secondary | ICD-10-CM | POA: Diagnosis not present

## 2021-04-29 DIAGNOSIS — R498 Other voice and resonance disorders: Secondary | ICD-10-CM | POA: Diagnosis not present

## 2021-04-29 DIAGNOSIS — M25561 Pain in right knee: Secondary | ICD-10-CM | POA: Diagnosis not present

## 2021-04-29 DIAGNOSIS — R41841 Cognitive communication deficit: Secondary | ICD-10-CM | POA: Diagnosis not present

## 2021-04-29 DIAGNOSIS — Z9181 History of falling: Secondary | ICD-10-CM | POA: Diagnosis not present

## 2021-04-29 DIAGNOSIS — R269 Unspecified abnormalities of gait and mobility: Secondary | ICD-10-CM | POA: Diagnosis not present

## 2021-04-29 DIAGNOSIS — Z8673 Personal history of transient ischemic attack (TIA), and cerebral infarction without residual deficits: Secondary | ICD-10-CM | POA: Diagnosis not present

## 2021-04-29 DIAGNOSIS — M6281 Muscle weakness (generalized): Secondary | ICD-10-CM | POA: Diagnosis not present

## 2021-04-29 DIAGNOSIS — R2689 Other abnormalities of gait and mobility: Secondary | ICD-10-CM | POA: Diagnosis not present

## 2021-04-29 DIAGNOSIS — M1990 Unspecified osteoarthritis, unspecified site: Secondary | ICD-10-CM | POA: Diagnosis not present

## 2021-04-29 DIAGNOSIS — M6258 Muscle wasting and atrophy, not elsewhere classified, other site: Secondary | ICD-10-CM | POA: Diagnosis not present

## 2021-04-29 DIAGNOSIS — R278 Other lack of coordination: Secondary | ICD-10-CM | POA: Diagnosis not present

## 2021-05-06 DIAGNOSIS — G250 Essential tremor: Secondary | ICD-10-CM | POA: Diagnosis not present

## 2021-05-06 DIAGNOSIS — F39 Unspecified mood [affective] disorder: Secondary | ICD-10-CM | POA: Diagnosis not present

## 2021-05-06 DIAGNOSIS — W19XXXA Unspecified fall, initial encounter: Secondary | ICD-10-CM | POA: Diagnosis not present

## 2021-05-06 DIAGNOSIS — I5040 Unspecified combined systolic (congestive) and diastolic (congestive) heart failure: Secondary | ICD-10-CM | POA: Diagnosis not present

## 2021-05-07 DIAGNOSIS — I110 Hypertensive heart disease with heart failure: Secondary | ICD-10-CM | POA: Diagnosis not present

## 2021-05-07 DIAGNOSIS — R262 Difficulty in walking, not elsewhere classified: Secondary | ICD-10-CM | POA: Diagnosis not present

## 2021-05-07 DIAGNOSIS — W19XXXA Unspecified fall, initial encounter: Secondary | ICD-10-CM | POA: Diagnosis not present

## 2021-05-07 DIAGNOSIS — I10 Essential (primary) hypertension: Secondary | ICD-10-CM | POA: Diagnosis not present

## 2021-05-07 DIAGNOSIS — S51019A Laceration without foreign body of unspecified elbow, initial encounter: Secondary | ICD-10-CM | POA: Diagnosis not present

## 2021-05-11 DIAGNOSIS — M6258 Muscle wasting and atrophy, not elsewhere classified, other site: Secondary | ICD-10-CM | POA: Diagnosis not present

## 2021-05-11 DIAGNOSIS — M1990 Unspecified osteoarthritis, unspecified site: Secondary | ICD-10-CM | POA: Diagnosis not present

## 2021-05-11 DIAGNOSIS — M6281 Muscle weakness (generalized): Secondary | ICD-10-CM | POA: Diagnosis not present

## 2021-05-11 DIAGNOSIS — Z9181 History of falling: Secondary | ICD-10-CM | POA: Diagnosis not present

## 2021-05-11 DIAGNOSIS — R498 Other voice and resonance disorders: Secondary | ICD-10-CM | POA: Diagnosis not present

## 2021-05-11 DIAGNOSIS — R278 Other lack of coordination: Secondary | ICD-10-CM | POA: Diagnosis not present

## 2021-05-11 DIAGNOSIS — R1312 Dysphagia, oropharyngeal phase: Secondary | ICD-10-CM | POA: Diagnosis not present

## 2021-05-11 DIAGNOSIS — R2689 Other abnormalities of gait and mobility: Secondary | ICD-10-CM | POA: Diagnosis not present

## 2021-05-11 DIAGNOSIS — Z8673 Personal history of transient ischemic attack (TIA), and cerebral infarction without residual deficits: Secondary | ICD-10-CM | POA: Diagnosis not present

## 2021-05-11 DIAGNOSIS — R269 Unspecified abnormalities of gait and mobility: Secondary | ICD-10-CM | POA: Diagnosis not present

## 2021-05-11 DIAGNOSIS — R2681 Unsteadiness on feet: Secondary | ICD-10-CM | POA: Diagnosis not present

## 2021-05-11 DIAGNOSIS — M25561 Pain in right knee: Secondary | ICD-10-CM | POA: Diagnosis not present

## 2021-05-11 DIAGNOSIS — R41841 Cognitive communication deficit: Secondary | ICD-10-CM | POA: Diagnosis not present

## 2021-05-12 DIAGNOSIS — M6281 Muscle weakness (generalized): Secondary | ICD-10-CM | POA: Diagnosis not present

## 2021-05-12 DIAGNOSIS — M1990 Unspecified osteoarthritis, unspecified site: Secondary | ICD-10-CM | POA: Diagnosis not present

## 2021-05-12 DIAGNOSIS — M25561 Pain in right knee: Secondary | ICD-10-CM | POA: Diagnosis not present

## 2021-05-12 DIAGNOSIS — R41841 Cognitive communication deficit: Secondary | ICD-10-CM | POA: Diagnosis not present

## 2021-05-12 DIAGNOSIS — S41102A Unspecified open wound of left upper arm, initial encounter: Secondary | ICD-10-CM | POA: Diagnosis not present

## 2021-05-12 DIAGNOSIS — R2689 Other abnormalities of gait and mobility: Secondary | ICD-10-CM | POA: Diagnosis not present

## 2021-05-12 DIAGNOSIS — R278 Other lack of coordination: Secondary | ICD-10-CM | POA: Diagnosis not present

## 2021-05-12 DIAGNOSIS — Z9181 History of falling: Secondary | ICD-10-CM | POA: Diagnosis not present

## 2021-05-12 DIAGNOSIS — R1312 Dysphagia, oropharyngeal phase: Secondary | ICD-10-CM | POA: Diagnosis not present

## 2021-05-12 DIAGNOSIS — R498 Other voice and resonance disorders: Secondary | ICD-10-CM | POA: Diagnosis not present

## 2021-05-12 DIAGNOSIS — Z8673 Personal history of transient ischemic attack (TIA), and cerebral infarction without residual deficits: Secondary | ICD-10-CM | POA: Diagnosis not present

## 2021-05-12 DIAGNOSIS — M6258 Muscle wasting and atrophy, not elsewhere classified, other site: Secondary | ICD-10-CM | POA: Diagnosis not present

## 2021-05-12 DIAGNOSIS — R2681 Unsteadiness on feet: Secondary | ICD-10-CM | POA: Diagnosis not present

## 2021-05-12 DIAGNOSIS — R269 Unspecified abnormalities of gait and mobility: Secondary | ICD-10-CM | POA: Diagnosis not present

## 2021-05-13 DIAGNOSIS — R41841 Cognitive communication deficit: Secondary | ICD-10-CM | POA: Diagnosis not present

## 2021-05-13 DIAGNOSIS — R498 Other voice and resonance disorders: Secondary | ICD-10-CM | POA: Diagnosis not present

## 2021-05-13 DIAGNOSIS — M6258 Muscle wasting and atrophy, not elsewhere classified, other site: Secondary | ICD-10-CM | POA: Diagnosis not present

## 2021-05-13 DIAGNOSIS — M6281 Muscle weakness (generalized): Secondary | ICD-10-CM | POA: Diagnosis not present

## 2021-05-13 DIAGNOSIS — M1990 Unspecified osteoarthritis, unspecified site: Secondary | ICD-10-CM | POA: Diagnosis not present

## 2021-05-13 DIAGNOSIS — R1312 Dysphagia, oropharyngeal phase: Secondary | ICD-10-CM | POA: Diagnosis not present

## 2021-05-13 DIAGNOSIS — R269 Unspecified abnormalities of gait and mobility: Secondary | ICD-10-CM | POA: Diagnosis not present

## 2021-05-13 DIAGNOSIS — Z8673 Personal history of transient ischemic attack (TIA), and cerebral infarction without residual deficits: Secondary | ICD-10-CM | POA: Diagnosis not present

## 2021-05-13 DIAGNOSIS — Z9181 History of falling: Secondary | ICD-10-CM | POA: Diagnosis not present

## 2021-05-13 DIAGNOSIS — R2689 Other abnormalities of gait and mobility: Secondary | ICD-10-CM | POA: Diagnosis not present

## 2021-05-13 DIAGNOSIS — M25561 Pain in right knee: Secondary | ICD-10-CM | POA: Diagnosis not present

## 2021-05-13 DIAGNOSIS — R278 Other lack of coordination: Secondary | ICD-10-CM | POA: Diagnosis not present

## 2021-05-13 DIAGNOSIS — R2681 Unsteadiness on feet: Secondary | ICD-10-CM | POA: Diagnosis not present

## 2021-05-14 DIAGNOSIS — M1990 Unspecified osteoarthritis, unspecified site: Secondary | ICD-10-CM | POA: Diagnosis not present

## 2021-05-14 DIAGNOSIS — R269 Unspecified abnormalities of gait and mobility: Secondary | ICD-10-CM | POA: Diagnosis not present

## 2021-05-14 DIAGNOSIS — M6258 Muscle wasting and atrophy, not elsewhere classified, other site: Secondary | ICD-10-CM | POA: Diagnosis not present

## 2021-05-14 DIAGNOSIS — R278 Other lack of coordination: Secondary | ICD-10-CM | POA: Diagnosis not present

## 2021-05-14 DIAGNOSIS — R498 Other voice and resonance disorders: Secondary | ICD-10-CM | POA: Diagnosis not present

## 2021-05-14 DIAGNOSIS — R2689 Other abnormalities of gait and mobility: Secondary | ICD-10-CM | POA: Diagnosis not present

## 2021-05-14 DIAGNOSIS — M6281 Muscle weakness (generalized): Secondary | ICD-10-CM | POA: Diagnosis not present

## 2021-05-14 DIAGNOSIS — R2681 Unsteadiness on feet: Secondary | ICD-10-CM | POA: Diagnosis not present

## 2021-05-14 DIAGNOSIS — R1312 Dysphagia, oropharyngeal phase: Secondary | ICD-10-CM | POA: Diagnosis not present

## 2021-05-14 DIAGNOSIS — R41841 Cognitive communication deficit: Secondary | ICD-10-CM | POA: Diagnosis not present

## 2021-05-14 DIAGNOSIS — M25561 Pain in right knee: Secondary | ICD-10-CM | POA: Diagnosis not present

## 2021-05-14 DIAGNOSIS — Z9181 History of falling: Secondary | ICD-10-CM | POA: Diagnosis not present

## 2021-05-14 DIAGNOSIS — Z8673 Personal history of transient ischemic attack (TIA), and cerebral infarction without residual deficits: Secondary | ICD-10-CM | POA: Diagnosis not present

## 2021-05-15 DIAGNOSIS — R2681 Unsteadiness on feet: Secondary | ICD-10-CM | POA: Diagnosis not present

## 2021-05-15 DIAGNOSIS — M6258 Muscle wasting and atrophy, not elsewhere classified, other site: Secondary | ICD-10-CM | POA: Diagnosis not present

## 2021-05-15 DIAGNOSIS — M6281 Muscle weakness (generalized): Secondary | ICD-10-CM | POA: Diagnosis not present

## 2021-05-15 DIAGNOSIS — R41841 Cognitive communication deficit: Secondary | ICD-10-CM | POA: Diagnosis not present

## 2021-05-15 DIAGNOSIS — R2689 Other abnormalities of gait and mobility: Secondary | ICD-10-CM | POA: Diagnosis not present

## 2021-05-15 DIAGNOSIS — Z8673 Personal history of transient ischemic attack (TIA), and cerebral infarction without residual deficits: Secondary | ICD-10-CM | POA: Diagnosis not present

## 2021-05-15 DIAGNOSIS — R1312 Dysphagia, oropharyngeal phase: Secondary | ICD-10-CM | POA: Diagnosis not present

## 2021-05-15 DIAGNOSIS — R278 Other lack of coordination: Secondary | ICD-10-CM | POA: Diagnosis not present

## 2021-05-15 DIAGNOSIS — M25561 Pain in right knee: Secondary | ICD-10-CM | POA: Diagnosis not present

## 2021-05-15 DIAGNOSIS — R269 Unspecified abnormalities of gait and mobility: Secondary | ICD-10-CM | POA: Diagnosis not present

## 2021-05-15 DIAGNOSIS — M1990 Unspecified osteoarthritis, unspecified site: Secondary | ICD-10-CM | POA: Diagnosis not present

## 2021-05-15 DIAGNOSIS — Z9181 History of falling: Secondary | ICD-10-CM | POA: Diagnosis not present

## 2021-05-15 DIAGNOSIS — R498 Other voice and resonance disorders: Secondary | ICD-10-CM | POA: Diagnosis not present

## 2021-05-18 DIAGNOSIS — Z9181 History of falling: Secondary | ICD-10-CM | POA: Diagnosis not present

## 2021-05-18 DIAGNOSIS — R2681 Unsteadiness on feet: Secondary | ICD-10-CM | POA: Diagnosis not present

## 2021-05-18 DIAGNOSIS — M6281 Muscle weakness (generalized): Secondary | ICD-10-CM | POA: Diagnosis not present

## 2021-05-18 DIAGNOSIS — Z8673 Personal history of transient ischemic attack (TIA), and cerebral infarction without residual deficits: Secondary | ICD-10-CM | POA: Diagnosis not present

## 2021-05-18 DIAGNOSIS — F411 Generalized anxiety disorder: Secondary | ICD-10-CM | POA: Diagnosis not present

## 2021-05-18 DIAGNOSIS — R278 Other lack of coordination: Secondary | ICD-10-CM | POA: Diagnosis not present

## 2021-05-18 DIAGNOSIS — R498 Other voice and resonance disorders: Secondary | ICD-10-CM | POA: Diagnosis not present

## 2021-05-18 DIAGNOSIS — R1312 Dysphagia, oropharyngeal phase: Secondary | ICD-10-CM | POA: Diagnosis not present

## 2021-05-18 DIAGNOSIS — R41841 Cognitive communication deficit: Secondary | ICD-10-CM | POA: Diagnosis not present

## 2021-05-18 DIAGNOSIS — R2689 Other abnormalities of gait and mobility: Secondary | ICD-10-CM | POA: Diagnosis not present

## 2021-05-18 DIAGNOSIS — F331 Major depressive disorder, recurrent, moderate: Secondary | ICD-10-CM | POA: Diagnosis not present

## 2021-05-18 DIAGNOSIS — R269 Unspecified abnormalities of gait and mobility: Secondary | ICD-10-CM | POA: Diagnosis not present

## 2021-05-18 DIAGNOSIS — M6258 Muscle wasting and atrophy, not elsewhere classified, other site: Secondary | ICD-10-CM | POA: Diagnosis not present

## 2021-05-18 DIAGNOSIS — M25561 Pain in right knee: Secondary | ICD-10-CM | POA: Diagnosis not present

## 2021-05-18 DIAGNOSIS — M1990 Unspecified osteoarthritis, unspecified site: Secondary | ICD-10-CM | POA: Diagnosis not present

## 2021-05-19 DIAGNOSIS — S41102A Unspecified open wound of left upper arm, initial encounter: Secondary | ICD-10-CM | POA: Diagnosis not present

## 2021-05-19 DIAGNOSIS — M25561 Pain in right knee: Secondary | ICD-10-CM | POA: Diagnosis not present

## 2021-05-19 DIAGNOSIS — M6281 Muscle weakness (generalized): Secondary | ICD-10-CM | POA: Diagnosis not present

## 2021-05-19 DIAGNOSIS — R498 Other voice and resonance disorders: Secondary | ICD-10-CM | POA: Diagnosis not present

## 2021-05-19 DIAGNOSIS — I2510 Atherosclerotic heart disease of native coronary artery without angina pectoris: Secondary | ICD-10-CM | POA: Diagnosis not present

## 2021-05-19 DIAGNOSIS — Z9181 History of falling: Secondary | ICD-10-CM | POA: Diagnosis not present

## 2021-05-19 DIAGNOSIS — R41841 Cognitive communication deficit: Secondary | ICD-10-CM | POA: Diagnosis not present

## 2021-05-19 DIAGNOSIS — I10 Essential (primary) hypertension: Secondary | ICD-10-CM | POA: Diagnosis not present

## 2021-05-19 DIAGNOSIS — R2689 Other abnormalities of gait and mobility: Secondary | ICD-10-CM | POA: Diagnosis not present

## 2021-05-19 DIAGNOSIS — M6258 Muscle wasting and atrophy, not elsewhere classified, other site: Secondary | ICD-10-CM | POA: Diagnosis not present

## 2021-05-19 DIAGNOSIS — M1990 Unspecified osteoarthritis, unspecified site: Secondary | ICD-10-CM | POA: Diagnosis not present

## 2021-05-19 DIAGNOSIS — I480 Paroxysmal atrial fibrillation: Secondary | ICD-10-CM | POA: Diagnosis not present

## 2021-05-19 DIAGNOSIS — Z8673 Personal history of transient ischemic attack (TIA), and cerebral infarction without residual deficits: Secondary | ICD-10-CM | POA: Diagnosis not present

## 2021-05-19 DIAGNOSIS — R1312 Dysphagia, oropharyngeal phase: Secondary | ICD-10-CM | POA: Diagnosis not present

## 2021-05-19 DIAGNOSIS — I4892 Unspecified atrial flutter: Secondary | ICD-10-CM | POA: Diagnosis not present

## 2021-05-19 DIAGNOSIS — R2681 Unsteadiness on feet: Secondary | ICD-10-CM | POA: Diagnosis not present

## 2021-05-19 DIAGNOSIS — R269 Unspecified abnormalities of gait and mobility: Secondary | ICD-10-CM | POA: Diagnosis not present

## 2021-05-19 DIAGNOSIS — R278 Other lack of coordination: Secondary | ICD-10-CM | POA: Diagnosis not present

## 2021-05-20 DIAGNOSIS — R278 Other lack of coordination: Secondary | ICD-10-CM | POA: Diagnosis not present

## 2021-05-20 DIAGNOSIS — R41841 Cognitive communication deficit: Secondary | ICD-10-CM | POA: Diagnosis not present

## 2021-05-20 DIAGNOSIS — R2681 Unsteadiness on feet: Secondary | ICD-10-CM | POA: Diagnosis not present

## 2021-05-20 DIAGNOSIS — R498 Other voice and resonance disorders: Secondary | ICD-10-CM | POA: Diagnosis not present

## 2021-05-20 DIAGNOSIS — R2689 Other abnormalities of gait and mobility: Secondary | ICD-10-CM | POA: Diagnosis not present

## 2021-05-20 DIAGNOSIS — M1990 Unspecified osteoarthritis, unspecified site: Secondary | ICD-10-CM | POA: Diagnosis not present

## 2021-05-20 DIAGNOSIS — R269 Unspecified abnormalities of gait and mobility: Secondary | ICD-10-CM | POA: Diagnosis not present

## 2021-05-20 DIAGNOSIS — Z9181 History of falling: Secondary | ICD-10-CM | POA: Diagnosis not present

## 2021-05-20 DIAGNOSIS — Z8673 Personal history of transient ischemic attack (TIA), and cerebral infarction without residual deficits: Secondary | ICD-10-CM | POA: Diagnosis not present

## 2021-05-20 DIAGNOSIS — M25561 Pain in right knee: Secondary | ICD-10-CM | POA: Diagnosis not present

## 2021-05-20 DIAGNOSIS — M6281 Muscle weakness (generalized): Secondary | ICD-10-CM | POA: Diagnosis not present

## 2021-05-20 DIAGNOSIS — M6258 Muscle wasting and atrophy, not elsewhere classified, other site: Secondary | ICD-10-CM | POA: Diagnosis not present

## 2021-05-20 DIAGNOSIS — R1312 Dysphagia, oropharyngeal phase: Secondary | ICD-10-CM | POA: Diagnosis not present

## 2021-05-21 DIAGNOSIS — R278 Other lack of coordination: Secondary | ICD-10-CM | POA: Diagnosis not present

## 2021-05-21 DIAGNOSIS — M1990 Unspecified osteoarthritis, unspecified site: Secondary | ICD-10-CM | POA: Diagnosis not present

## 2021-05-21 DIAGNOSIS — M6281 Muscle weakness (generalized): Secondary | ICD-10-CM | POA: Diagnosis not present

## 2021-05-21 DIAGNOSIS — R269 Unspecified abnormalities of gait and mobility: Secondary | ICD-10-CM | POA: Diagnosis not present

## 2021-05-21 DIAGNOSIS — Z9181 History of falling: Secondary | ICD-10-CM | POA: Diagnosis not present

## 2021-05-21 DIAGNOSIS — M25561 Pain in right knee: Secondary | ICD-10-CM | POA: Diagnosis not present

## 2021-05-21 DIAGNOSIS — R1312 Dysphagia, oropharyngeal phase: Secondary | ICD-10-CM | POA: Diagnosis not present

## 2021-05-21 DIAGNOSIS — R498 Other voice and resonance disorders: Secondary | ICD-10-CM | POA: Diagnosis not present

## 2021-05-21 DIAGNOSIS — R2689 Other abnormalities of gait and mobility: Secondary | ICD-10-CM | POA: Diagnosis not present

## 2021-05-21 DIAGNOSIS — Z8673 Personal history of transient ischemic attack (TIA), and cerebral infarction without residual deficits: Secondary | ICD-10-CM | POA: Diagnosis not present

## 2021-05-21 DIAGNOSIS — R41841 Cognitive communication deficit: Secondary | ICD-10-CM | POA: Diagnosis not present

## 2021-05-21 DIAGNOSIS — M6258 Muscle wasting and atrophy, not elsewhere classified, other site: Secondary | ICD-10-CM | POA: Diagnosis not present

## 2021-05-21 DIAGNOSIS — R2681 Unsteadiness on feet: Secondary | ICD-10-CM | POA: Diagnosis not present

## 2021-05-22 DIAGNOSIS — M6281 Muscle weakness (generalized): Secondary | ICD-10-CM | POA: Diagnosis not present

## 2021-05-22 DIAGNOSIS — R2681 Unsteadiness on feet: Secondary | ICD-10-CM | POA: Diagnosis not present

## 2021-05-22 DIAGNOSIS — M6258 Muscle wasting and atrophy, not elsewhere classified, other site: Secondary | ICD-10-CM | POA: Diagnosis not present

## 2021-05-22 DIAGNOSIS — R41841 Cognitive communication deficit: Secondary | ICD-10-CM | POA: Diagnosis not present

## 2021-05-22 DIAGNOSIS — R1312 Dysphagia, oropharyngeal phase: Secondary | ICD-10-CM | POA: Diagnosis not present

## 2021-05-22 DIAGNOSIS — M1990 Unspecified osteoarthritis, unspecified site: Secondary | ICD-10-CM | POA: Diagnosis not present

## 2021-05-22 DIAGNOSIS — Z8673 Personal history of transient ischemic attack (TIA), and cerebral infarction without residual deficits: Secondary | ICD-10-CM | POA: Diagnosis not present

## 2021-05-22 DIAGNOSIS — M25561 Pain in right knee: Secondary | ICD-10-CM | POA: Diagnosis not present

## 2021-05-22 DIAGNOSIS — Z9181 History of falling: Secondary | ICD-10-CM | POA: Diagnosis not present

## 2021-05-22 DIAGNOSIS — R498 Other voice and resonance disorders: Secondary | ICD-10-CM | POA: Diagnosis not present

## 2021-05-22 DIAGNOSIS — R278 Other lack of coordination: Secondary | ICD-10-CM | POA: Diagnosis not present

## 2021-05-22 DIAGNOSIS — R2689 Other abnormalities of gait and mobility: Secondary | ICD-10-CM | POA: Diagnosis not present

## 2021-05-22 DIAGNOSIS — R269 Unspecified abnormalities of gait and mobility: Secondary | ICD-10-CM | POA: Diagnosis not present

## 2021-05-25 DIAGNOSIS — R269 Unspecified abnormalities of gait and mobility: Secondary | ICD-10-CM | POA: Diagnosis not present

## 2021-05-25 DIAGNOSIS — M6258 Muscle wasting and atrophy, not elsewhere classified, other site: Secondary | ICD-10-CM | POA: Diagnosis not present

## 2021-05-25 DIAGNOSIS — R2681 Unsteadiness on feet: Secondary | ICD-10-CM | POA: Diagnosis not present

## 2021-05-25 DIAGNOSIS — R498 Other voice and resonance disorders: Secondary | ICD-10-CM | POA: Diagnosis not present

## 2021-05-25 DIAGNOSIS — R278 Other lack of coordination: Secondary | ICD-10-CM | POA: Diagnosis not present

## 2021-05-25 DIAGNOSIS — M25561 Pain in right knee: Secondary | ICD-10-CM | POA: Diagnosis not present

## 2021-05-25 DIAGNOSIS — R1312 Dysphagia, oropharyngeal phase: Secondary | ICD-10-CM | POA: Diagnosis not present

## 2021-05-25 DIAGNOSIS — M1990 Unspecified osteoarthritis, unspecified site: Secondary | ICD-10-CM | POA: Diagnosis not present

## 2021-05-25 DIAGNOSIS — M6281 Muscle weakness (generalized): Secondary | ICD-10-CM | POA: Diagnosis not present

## 2021-05-25 DIAGNOSIS — R2689 Other abnormalities of gait and mobility: Secondary | ICD-10-CM | POA: Diagnosis not present

## 2021-05-25 DIAGNOSIS — R41841 Cognitive communication deficit: Secondary | ICD-10-CM | POA: Diagnosis not present

## 2021-05-25 DIAGNOSIS — Z8673 Personal history of transient ischemic attack (TIA), and cerebral infarction without residual deficits: Secondary | ICD-10-CM | POA: Diagnosis not present

## 2021-05-25 DIAGNOSIS — Z9181 History of falling: Secondary | ICD-10-CM | POA: Diagnosis not present

## 2021-05-26 DIAGNOSIS — R1312 Dysphagia, oropharyngeal phase: Secondary | ICD-10-CM | POA: Diagnosis not present

## 2021-05-26 DIAGNOSIS — R2689 Other abnormalities of gait and mobility: Secondary | ICD-10-CM | POA: Diagnosis not present

## 2021-05-26 DIAGNOSIS — M1990 Unspecified osteoarthritis, unspecified site: Secondary | ICD-10-CM | POA: Diagnosis not present

## 2021-05-26 DIAGNOSIS — R2681 Unsteadiness on feet: Secondary | ICD-10-CM | POA: Diagnosis not present

## 2021-05-26 DIAGNOSIS — M6281 Muscle weakness (generalized): Secondary | ICD-10-CM | POA: Diagnosis not present

## 2021-05-26 DIAGNOSIS — M6258 Muscle wasting and atrophy, not elsewhere classified, other site: Secondary | ICD-10-CM | POA: Diagnosis not present

## 2021-05-26 DIAGNOSIS — Z9181 History of falling: Secondary | ICD-10-CM | POA: Diagnosis not present

## 2021-05-26 DIAGNOSIS — M25561 Pain in right knee: Secondary | ICD-10-CM | POA: Diagnosis not present

## 2021-05-26 DIAGNOSIS — R498 Other voice and resonance disorders: Secondary | ICD-10-CM | POA: Diagnosis not present

## 2021-05-26 DIAGNOSIS — S41102A Unspecified open wound of left upper arm, initial encounter: Secondary | ICD-10-CM | POA: Diagnosis not present

## 2021-05-26 DIAGNOSIS — R278 Other lack of coordination: Secondary | ICD-10-CM | POA: Diagnosis not present

## 2021-05-26 DIAGNOSIS — R41841 Cognitive communication deficit: Secondary | ICD-10-CM | POA: Diagnosis not present

## 2021-05-26 DIAGNOSIS — R269 Unspecified abnormalities of gait and mobility: Secondary | ICD-10-CM | POA: Diagnosis not present

## 2021-05-26 DIAGNOSIS — Z8673 Personal history of transient ischemic attack (TIA), and cerebral infarction without residual deficits: Secondary | ICD-10-CM | POA: Diagnosis not present

## 2021-05-27 DIAGNOSIS — M1990 Unspecified osteoarthritis, unspecified site: Secondary | ICD-10-CM | POA: Diagnosis not present

## 2021-05-27 DIAGNOSIS — M6258 Muscle wasting and atrophy, not elsewhere classified, other site: Secondary | ICD-10-CM | POA: Diagnosis not present

## 2021-05-27 DIAGNOSIS — Z8673 Personal history of transient ischemic attack (TIA), and cerebral infarction without residual deficits: Secondary | ICD-10-CM | POA: Diagnosis not present

## 2021-05-27 DIAGNOSIS — R2689 Other abnormalities of gait and mobility: Secondary | ICD-10-CM | POA: Diagnosis not present

## 2021-05-27 DIAGNOSIS — M25561 Pain in right knee: Secondary | ICD-10-CM | POA: Diagnosis not present

## 2021-05-27 DIAGNOSIS — R278 Other lack of coordination: Secondary | ICD-10-CM | POA: Diagnosis not present

## 2021-05-27 DIAGNOSIS — R41841 Cognitive communication deficit: Secondary | ICD-10-CM | POA: Diagnosis not present

## 2021-05-27 DIAGNOSIS — R269 Unspecified abnormalities of gait and mobility: Secondary | ICD-10-CM | POA: Diagnosis not present

## 2021-05-27 DIAGNOSIS — R1312 Dysphagia, oropharyngeal phase: Secondary | ICD-10-CM | POA: Diagnosis not present

## 2021-05-27 DIAGNOSIS — R2681 Unsteadiness on feet: Secondary | ICD-10-CM | POA: Diagnosis not present

## 2021-05-27 DIAGNOSIS — Z9181 History of falling: Secondary | ICD-10-CM | POA: Diagnosis not present

## 2021-05-27 DIAGNOSIS — M6281 Muscle weakness (generalized): Secondary | ICD-10-CM | POA: Diagnosis not present

## 2021-05-27 DIAGNOSIS — R498 Other voice and resonance disorders: Secondary | ICD-10-CM | POA: Diagnosis not present

## 2021-05-28 DIAGNOSIS — S40812D Abrasion of left upper arm, subsequent encounter: Secondary | ICD-10-CM | POA: Diagnosis not present

## 2021-05-28 DIAGNOSIS — M6258 Muscle wasting and atrophy, not elsewhere classified, other site: Secondary | ICD-10-CM | POA: Diagnosis not present

## 2021-05-28 DIAGNOSIS — M1990 Unspecified osteoarthritis, unspecified site: Secondary | ICD-10-CM | POA: Diagnosis not present

## 2021-05-28 DIAGNOSIS — M25561 Pain in right knee: Secondary | ICD-10-CM | POA: Diagnosis not present

## 2021-05-28 DIAGNOSIS — Z9181 History of falling: Secondary | ICD-10-CM | POA: Diagnosis not present

## 2021-05-28 DIAGNOSIS — Z8673 Personal history of transient ischemic attack (TIA), and cerebral infarction without residual deficits: Secondary | ICD-10-CM | POA: Diagnosis not present

## 2021-05-28 DIAGNOSIS — R41841 Cognitive communication deficit: Secondary | ICD-10-CM | POA: Diagnosis not present

## 2021-05-28 DIAGNOSIS — R278 Other lack of coordination: Secondary | ICD-10-CM | POA: Diagnosis not present

## 2021-05-28 DIAGNOSIS — R2681 Unsteadiness on feet: Secondary | ICD-10-CM | POA: Diagnosis not present

## 2021-05-28 DIAGNOSIS — R1312 Dysphagia, oropharyngeal phase: Secondary | ICD-10-CM | POA: Diagnosis not present

## 2021-05-28 DIAGNOSIS — R269 Unspecified abnormalities of gait and mobility: Secondary | ICD-10-CM | POA: Diagnosis not present

## 2021-05-28 DIAGNOSIS — R498 Other voice and resonance disorders: Secondary | ICD-10-CM | POA: Diagnosis not present

## 2021-05-28 DIAGNOSIS — M6281 Muscle weakness (generalized): Secondary | ICD-10-CM | POA: Diagnosis not present

## 2021-05-28 DIAGNOSIS — R2689 Other abnormalities of gait and mobility: Secondary | ICD-10-CM | POA: Diagnosis not present

## 2021-05-29 DIAGNOSIS — R2681 Unsteadiness on feet: Secondary | ICD-10-CM | POA: Diagnosis not present

## 2021-05-29 DIAGNOSIS — R2689 Other abnormalities of gait and mobility: Secondary | ICD-10-CM | POA: Diagnosis not present

## 2021-05-29 DIAGNOSIS — R498 Other voice and resonance disorders: Secondary | ICD-10-CM | POA: Diagnosis not present

## 2021-05-29 DIAGNOSIS — M1990 Unspecified osteoarthritis, unspecified site: Secondary | ICD-10-CM | POA: Diagnosis not present

## 2021-05-29 DIAGNOSIS — R41841 Cognitive communication deficit: Secondary | ICD-10-CM | POA: Diagnosis not present

## 2021-05-29 DIAGNOSIS — Z8673 Personal history of transient ischemic attack (TIA), and cerebral infarction without residual deficits: Secondary | ICD-10-CM | POA: Diagnosis not present

## 2021-05-29 DIAGNOSIS — R269 Unspecified abnormalities of gait and mobility: Secondary | ICD-10-CM | POA: Diagnosis not present

## 2021-05-29 DIAGNOSIS — R1312 Dysphagia, oropharyngeal phase: Secondary | ICD-10-CM | POA: Diagnosis not present

## 2021-05-29 DIAGNOSIS — R278 Other lack of coordination: Secondary | ICD-10-CM | POA: Diagnosis not present

## 2021-05-29 DIAGNOSIS — M25561 Pain in right knee: Secondary | ICD-10-CM | POA: Diagnosis not present

## 2021-05-29 DIAGNOSIS — M6258 Muscle wasting and atrophy, not elsewhere classified, other site: Secondary | ICD-10-CM | POA: Diagnosis not present

## 2021-05-29 DIAGNOSIS — M6281 Muscle weakness (generalized): Secondary | ICD-10-CM | POA: Diagnosis not present

## 2021-05-29 DIAGNOSIS — Z9181 History of falling: Secondary | ICD-10-CM | POA: Diagnosis not present

## 2021-06-01 DIAGNOSIS — Z9181 History of falling: Secondary | ICD-10-CM | POA: Diagnosis not present

## 2021-06-01 DIAGNOSIS — R269 Unspecified abnormalities of gait and mobility: Secondary | ICD-10-CM | POA: Diagnosis not present

## 2021-06-01 DIAGNOSIS — Z8673 Personal history of transient ischemic attack (TIA), and cerebral infarction without residual deficits: Secondary | ICD-10-CM | POA: Diagnosis not present

## 2021-06-01 DIAGNOSIS — R278 Other lack of coordination: Secondary | ICD-10-CM | POA: Diagnosis not present

## 2021-06-01 DIAGNOSIS — R1312 Dysphagia, oropharyngeal phase: Secondary | ICD-10-CM | POA: Diagnosis not present

## 2021-06-01 DIAGNOSIS — M1990 Unspecified osteoarthritis, unspecified site: Secondary | ICD-10-CM | POA: Diagnosis not present

## 2021-06-01 DIAGNOSIS — M6258 Muscle wasting and atrophy, not elsewhere classified, other site: Secondary | ICD-10-CM | POA: Diagnosis not present

## 2021-06-01 DIAGNOSIS — M25561 Pain in right knee: Secondary | ICD-10-CM | POA: Diagnosis not present

## 2021-06-01 DIAGNOSIS — R41841 Cognitive communication deficit: Secondary | ICD-10-CM | POA: Diagnosis not present

## 2021-06-01 DIAGNOSIS — M6281 Muscle weakness (generalized): Secondary | ICD-10-CM | POA: Diagnosis not present

## 2021-06-01 DIAGNOSIS — R2681 Unsteadiness on feet: Secondary | ICD-10-CM | POA: Diagnosis not present

## 2021-06-01 DIAGNOSIS — R498 Other voice and resonance disorders: Secondary | ICD-10-CM | POA: Diagnosis not present

## 2021-06-01 DIAGNOSIS — R2689 Other abnormalities of gait and mobility: Secondary | ICD-10-CM | POA: Diagnosis not present

## 2021-06-02 DIAGNOSIS — Z8673 Personal history of transient ischemic attack (TIA), and cerebral infarction without residual deficits: Secondary | ICD-10-CM | POA: Diagnosis not present

## 2021-06-02 DIAGNOSIS — Z9181 History of falling: Secondary | ICD-10-CM | POA: Diagnosis not present

## 2021-06-02 DIAGNOSIS — M6281 Muscle weakness (generalized): Secondary | ICD-10-CM | POA: Diagnosis not present

## 2021-06-02 DIAGNOSIS — R2681 Unsteadiness on feet: Secondary | ICD-10-CM | POA: Diagnosis not present

## 2021-06-02 DIAGNOSIS — R634 Abnormal weight loss: Secondary | ICD-10-CM | POA: Diagnosis not present

## 2021-06-02 DIAGNOSIS — R498 Other voice and resonance disorders: Secondary | ICD-10-CM | POA: Diagnosis not present

## 2021-06-02 DIAGNOSIS — I5040 Unspecified combined systolic (congestive) and diastolic (congestive) heart failure: Secondary | ICD-10-CM | POA: Diagnosis not present

## 2021-06-02 DIAGNOSIS — R490 Dysphonia: Secondary | ICD-10-CM | POA: Diagnosis not present

## 2021-06-02 DIAGNOSIS — R278 Other lack of coordination: Secondary | ICD-10-CM | POA: Diagnosis not present

## 2021-06-02 DIAGNOSIS — R200 Anesthesia of skin: Secondary | ICD-10-CM | POA: Diagnosis not present

## 2021-06-02 DIAGNOSIS — M1990 Unspecified osteoarthritis, unspecified site: Secondary | ICD-10-CM | POA: Diagnosis not present

## 2021-06-02 DIAGNOSIS — R269 Unspecified abnormalities of gait and mobility: Secondary | ICD-10-CM | POA: Diagnosis not present

## 2021-06-02 DIAGNOSIS — M6258 Muscle wasting and atrophy, not elsewhere classified, other site: Secondary | ICD-10-CM | POA: Diagnosis not present

## 2021-06-02 DIAGNOSIS — R41841 Cognitive communication deficit: Secondary | ICD-10-CM | POA: Diagnosis not present

## 2021-06-02 DIAGNOSIS — M25561 Pain in right knee: Secondary | ICD-10-CM | POA: Diagnosis not present

## 2021-06-02 DIAGNOSIS — R2689 Other abnormalities of gait and mobility: Secondary | ICD-10-CM | POA: Diagnosis not present

## 2021-06-02 DIAGNOSIS — R1312 Dysphagia, oropharyngeal phase: Secondary | ICD-10-CM | POA: Diagnosis not present

## 2021-06-03 DIAGNOSIS — R41841 Cognitive communication deficit: Secondary | ICD-10-CM | POA: Diagnosis not present

## 2021-06-03 DIAGNOSIS — M6281 Muscle weakness (generalized): Secondary | ICD-10-CM | POA: Diagnosis not present

## 2021-06-03 DIAGNOSIS — M6258 Muscle wasting and atrophy, not elsewhere classified, other site: Secondary | ICD-10-CM | POA: Diagnosis not present

## 2021-06-03 DIAGNOSIS — M25561 Pain in right knee: Secondary | ICD-10-CM | POA: Diagnosis not present

## 2021-06-03 DIAGNOSIS — R2689 Other abnormalities of gait and mobility: Secondary | ICD-10-CM | POA: Diagnosis not present

## 2021-06-03 DIAGNOSIS — M1990 Unspecified osteoarthritis, unspecified site: Secondary | ICD-10-CM | POA: Diagnosis not present

## 2021-06-03 DIAGNOSIS — Z9181 History of falling: Secondary | ICD-10-CM | POA: Diagnosis not present

## 2021-06-03 DIAGNOSIS — R269 Unspecified abnormalities of gait and mobility: Secondary | ICD-10-CM | POA: Diagnosis not present

## 2021-06-03 DIAGNOSIS — Z8673 Personal history of transient ischemic attack (TIA), and cerebral infarction without residual deficits: Secondary | ICD-10-CM | POA: Diagnosis not present

## 2021-06-03 DIAGNOSIS — R278 Other lack of coordination: Secondary | ICD-10-CM | POA: Diagnosis not present

## 2021-06-03 DIAGNOSIS — R1312 Dysphagia, oropharyngeal phase: Secondary | ICD-10-CM | POA: Diagnosis not present

## 2021-06-03 DIAGNOSIS — R498 Other voice and resonance disorders: Secondary | ICD-10-CM | POA: Diagnosis not present

## 2021-06-03 DIAGNOSIS — R2681 Unsteadiness on feet: Secondary | ICD-10-CM | POA: Diagnosis not present

## 2021-06-04 DIAGNOSIS — M25561 Pain in right knee: Secondary | ICD-10-CM | POA: Diagnosis not present

## 2021-06-04 DIAGNOSIS — R2681 Unsteadiness on feet: Secondary | ICD-10-CM | POA: Diagnosis not present

## 2021-06-04 DIAGNOSIS — Z8673 Personal history of transient ischemic attack (TIA), and cerebral infarction without residual deficits: Secondary | ICD-10-CM | POA: Diagnosis not present

## 2021-06-04 DIAGNOSIS — R498 Other voice and resonance disorders: Secondary | ICD-10-CM | POA: Diagnosis not present

## 2021-06-04 DIAGNOSIS — R278 Other lack of coordination: Secondary | ICD-10-CM | POA: Diagnosis not present

## 2021-06-04 DIAGNOSIS — Z9181 History of falling: Secondary | ICD-10-CM | POA: Diagnosis not present

## 2021-06-04 DIAGNOSIS — M6258 Muscle wasting and atrophy, not elsewhere classified, other site: Secondary | ICD-10-CM | POA: Diagnosis not present

## 2021-06-04 DIAGNOSIS — M6281 Muscle weakness (generalized): Secondary | ICD-10-CM | POA: Diagnosis not present

## 2021-06-04 DIAGNOSIS — M1990 Unspecified osteoarthritis, unspecified site: Secondary | ICD-10-CM | POA: Diagnosis not present

## 2021-06-04 DIAGNOSIS — R1312 Dysphagia, oropharyngeal phase: Secondary | ICD-10-CM | POA: Diagnosis not present

## 2021-06-04 DIAGNOSIS — R41841 Cognitive communication deficit: Secondary | ICD-10-CM | POA: Diagnosis not present

## 2021-06-04 DIAGNOSIS — R2689 Other abnormalities of gait and mobility: Secondary | ICD-10-CM | POA: Diagnosis not present

## 2021-06-04 DIAGNOSIS — R269 Unspecified abnormalities of gait and mobility: Secondary | ICD-10-CM | POA: Diagnosis not present

## 2021-06-05 DIAGNOSIS — R41841 Cognitive communication deficit: Secondary | ICD-10-CM | POA: Diagnosis not present

## 2021-06-05 DIAGNOSIS — R1312 Dysphagia, oropharyngeal phase: Secondary | ICD-10-CM | POA: Diagnosis not present

## 2021-06-05 DIAGNOSIS — Z8673 Personal history of transient ischemic attack (TIA), and cerebral infarction without residual deficits: Secondary | ICD-10-CM | POA: Diagnosis not present

## 2021-06-05 DIAGNOSIS — R278 Other lack of coordination: Secondary | ICD-10-CM | POA: Diagnosis not present

## 2021-06-05 DIAGNOSIS — R2689 Other abnormalities of gait and mobility: Secondary | ICD-10-CM | POA: Diagnosis not present

## 2021-06-05 DIAGNOSIS — R2681 Unsteadiness on feet: Secondary | ICD-10-CM | POA: Diagnosis not present

## 2021-06-05 DIAGNOSIS — M1990 Unspecified osteoarthritis, unspecified site: Secondary | ICD-10-CM | POA: Diagnosis not present

## 2021-06-05 DIAGNOSIS — R269 Unspecified abnormalities of gait and mobility: Secondary | ICD-10-CM | POA: Diagnosis not present

## 2021-06-05 DIAGNOSIS — M25561 Pain in right knee: Secondary | ICD-10-CM | POA: Diagnosis not present

## 2021-06-05 DIAGNOSIS — M6281 Muscle weakness (generalized): Secondary | ICD-10-CM | POA: Diagnosis not present

## 2021-06-05 DIAGNOSIS — R498 Other voice and resonance disorders: Secondary | ICD-10-CM | POA: Diagnosis not present

## 2021-06-05 DIAGNOSIS — M6258 Muscle wasting and atrophy, not elsewhere classified, other site: Secondary | ICD-10-CM | POA: Diagnosis not present

## 2021-06-05 DIAGNOSIS — Z9181 History of falling: Secondary | ICD-10-CM | POA: Diagnosis not present

## 2021-06-09 DIAGNOSIS — R1312 Dysphagia, oropharyngeal phase: Secondary | ICD-10-CM | POA: Diagnosis not present

## 2021-06-09 DIAGNOSIS — M6281 Muscle weakness (generalized): Secondary | ICD-10-CM | POA: Diagnosis not present

## 2021-06-09 DIAGNOSIS — R2689 Other abnormalities of gait and mobility: Secondary | ICD-10-CM | POA: Diagnosis not present

## 2021-06-09 DIAGNOSIS — R269 Unspecified abnormalities of gait and mobility: Secondary | ICD-10-CM | POA: Diagnosis not present

## 2021-06-09 DIAGNOSIS — M25561 Pain in right knee: Secondary | ICD-10-CM | POA: Diagnosis not present

## 2021-06-09 DIAGNOSIS — R2681 Unsteadiness on feet: Secondary | ICD-10-CM | POA: Diagnosis not present

## 2021-06-09 DIAGNOSIS — Z8673 Personal history of transient ischemic attack (TIA), and cerebral infarction without residual deficits: Secondary | ICD-10-CM | POA: Diagnosis not present

## 2021-06-09 DIAGNOSIS — M6258 Muscle wasting and atrophy, not elsewhere classified, other site: Secondary | ICD-10-CM | POA: Diagnosis not present

## 2021-06-09 DIAGNOSIS — R278 Other lack of coordination: Secondary | ICD-10-CM | POA: Diagnosis not present

## 2021-06-09 DIAGNOSIS — M1990 Unspecified osteoarthritis, unspecified site: Secondary | ICD-10-CM | POA: Diagnosis not present

## 2021-06-09 DIAGNOSIS — R41841 Cognitive communication deficit: Secondary | ICD-10-CM | POA: Diagnosis not present

## 2021-06-09 DIAGNOSIS — Z9181 History of falling: Secondary | ICD-10-CM | POA: Diagnosis not present

## 2021-06-09 DIAGNOSIS — R498 Other voice and resonance disorders: Secondary | ICD-10-CM | POA: Diagnosis not present

## 2021-06-10 ENCOUNTER — Encounter: Payer: Medicare Other | Admitting: Internal Medicine

## 2021-06-20 DIAGNOSIS — M6258 Muscle wasting and atrophy, not elsewhere classified, other site: Secondary | ICD-10-CM | POA: Diagnosis not present

## 2021-06-20 DIAGNOSIS — I5042 Chronic combined systolic (congestive) and diastolic (congestive) heart failure: Secondary | ICD-10-CM | POA: Diagnosis not present

## 2021-06-20 DIAGNOSIS — R1312 Dysphagia, oropharyngeal phase: Secondary | ICD-10-CM | POA: Diagnosis not present

## 2021-06-20 DIAGNOSIS — M6281 Muscle weakness (generalized): Secondary | ICD-10-CM | POA: Diagnosis not present

## 2021-06-20 DIAGNOSIS — R2689 Other abnormalities of gait and mobility: Secondary | ICD-10-CM | POA: Diagnosis not present

## 2021-06-20 DIAGNOSIS — R262 Difficulty in walking, not elsewhere classified: Secondary | ICD-10-CM | POA: Diagnosis not present

## 2021-06-20 DIAGNOSIS — R2681 Unsteadiness on feet: Secondary | ICD-10-CM | POA: Diagnosis not present

## 2021-06-20 DIAGNOSIS — M1990 Unspecified osteoarthritis, unspecified site: Secondary | ICD-10-CM | POA: Diagnosis not present

## 2021-06-20 DIAGNOSIS — Z9181 History of falling: Secondary | ICD-10-CM | POA: Diagnosis not present

## 2021-06-20 DIAGNOSIS — R41841 Cognitive communication deficit: Secondary | ICD-10-CM | POA: Diagnosis not present

## 2021-06-20 DIAGNOSIS — R269 Unspecified abnormalities of gait and mobility: Secondary | ICD-10-CM | POA: Diagnosis not present

## 2021-06-20 DIAGNOSIS — Z8673 Personal history of transient ischemic attack (TIA), and cerebral infarction without residual deficits: Secondary | ICD-10-CM | POA: Diagnosis not present

## 2021-06-20 DIAGNOSIS — R278 Other lack of coordination: Secondary | ICD-10-CM | POA: Diagnosis not present

## 2021-06-20 DIAGNOSIS — R498 Other voice and resonance disorders: Secondary | ICD-10-CM | POA: Diagnosis not present

## 2021-06-20 DIAGNOSIS — M25561 Pain in right knee: Secondary | ICD-10-CM | POA: Diagnosis not present

## 2021-06-22 DIAGNOSIS — M6281 Muscle weakness (generalized): Secondary | ICD-10-CM | POA: Diagnosis not present

## 2021-06-22 DIAGNOSIS — Z8673 Personal history of transient ischemic attack (TIA), and cerebral infarction without residual deficits: Secondary | ICD-10-CM | POA: Diagnosis not present

## 2021-06-22 DIAGNOSIS — R41841 Cognitive communication deficit: Secondary | ICD-10-CM | POA: Diagnosis not present

## 2021-06-22 DIAGNOSIS — R269 Unspecified abnormalities of gait and mobility: Secondary | ICD-10-CM | POA: Diagnosis not present

## 2021-06-22 DIAGNOSIS — R498 Other voice and resonance disorders: Secondary | ICD-10-CM | POA: Diagnosis not present

## 2021-06-22 DIAGNOSIS — M25561 Pain in right knee: Secondary | ICD-10-CM | POA: Diagnosis not present

## 2021-06-22 DIAGNOSIS — Z9181 History of falling: Secondary | ICD-10-CM | POA: Diagnosis not present

## 2021-06-22 DIAGNOSIS — R1312 Dysphagia, oropharyngeal phase: Secondary | ICD-10-CM | POA: Diagnosis not present

## 2021-06-22 DIAGNOSIS — M1990 Unspecified osteoarthritis, unspecified site: Secondary | ICD-10-CM | POA: Diagnosis not present

## 2021-06-22 DIAGNOSIS — R2689 Other abnormalities of gait and mobility: Secondary | ICD-10-CM | POA: Diagnosis not present

## 2021-06-22 DIAGNOSIS — I5042 Chronic combined systolic (congestive) and diastolic (congestive) heart failure: Secondary | ICD-10-CM | POA: Diagnosis not present

## 2021-06-22 DIAGNOSIS — M6258 Muscle wasting and atrophy, not elsewhere classified, other site: Secondary | ICD-10-CM | POA: Diagnosis not present

## 2021-06-22 DIAGNOSIS — R2681 Unsteadiness on feet: Secondary | ICD-10-CM | POA: Diagnosis not present

## 2021-06-22 DIAGNOSIS — R262 Difficulty in walking, not elsewhere classified: Secondary | ICD-10-CM | POA: Diagnosis not present

## 2021-06-22 DIAGNOSIS — R278 Other lack of coordination: Secondary | ICD-10-CM | POA: Diagnosis not present

## 2021-06-23 DIAGNOSIS — M6281 Muscle weakness (generalized): Secondary | ICD-10-CM | POA: Diagnosis not present

## 2021-06-23 DIAGNOSIS — M6258 Muscle wasting and atrophy, not elsewhere classified, other site: Secondary | ICD-10-CM | POA: Diagnosis not present

## 2021-06-23 DIAGNOSIS — R2689 Other abnormalities of gait and mobility: Secondary | ICD-10-CM | POA: Diagnosis not present

## 2021-06-23 DIAGNOSIS — M25561 Pain in right knee: Secondary | ICD-10-CM | POA: Diagnosis not present

## 2021-06-23 DIAGNOSIS — Z8673 Personal history of transient ischemic attack (TIA), and cerebral infarction without residual deficits: Secondary | ICD-10-CM | POA: Diagnosis not present

## 2021-06-23 DIAGNOSIS — R262 Difficulty in walking, not elsewhere classified: Secondary | ICD-10-CM | POA: Diagnosis not present

## 2021-06-23 DIAGNOSIS — I5042 Chronic combined systolic (congestive) and diastolic (congestive) heart failure: Secondary | ICD-10-CM | POA: Diagnosis not present

## 2021-06-23 DIAGNOSIS — M1990 Unspecified osteoarthritis, unspecified site: Secondary | ICD-10-CM | POA: Diagnosis not present

## 2021-06-23 DIAGNOSIS — R278 Other lack of coordination: Secondary | ICD-10-CM | POA: Diagnosis not present

## 2021-06-23 DIAGNOSIS — Z9181 History of falling: Secondary | ICD-10-CM | POA: Diagnosis not present

## 2021-06-23 DIAGNOSIS — R269 Unspecified abnormalities of gait and mobility: Secondary | ICD-10-CM | POA: Diagnosis not present

## 2021-06-23 DIAGNOSIS — R1312 Dysphagia, oropharyngeal phase: Secondary | ICD-10-CM | POA: Diagnosis not present

## 2021-06-23 DIAGNOSIS — R2681 Unsteadiness on feet: Secondary | ICD-10-CM | POA: Diagnosis not present

## 2021-06-23 DIAGNOSIS — R498 Other voice and resonance disorders: Secondary | ICD-10-CM | POA: Diagnosis not present

## 2021-06-23 DIAGNOSIS — R41841 Cognitive communication deficit: Secondary | ICD-10-CM | POA: Diagnosis not present

## 2021-06-25 DIAGNOSIS — R2681 Unsteadiness on feet: Secondary | ICD-10-CM | POA: Diagnosis not present

## 2021-06-25 DIAGNOSIS — M6258 Muscle wasting and atrophy, not elsewhere classified, other site: Secondary | ICD-10-CM | POA: Diagnosis not present

## 2021-06-25 DIAGNOSIS — R262 Difficulty in walking, not elsewhere classified: Secondary | ICD-10-CM | POA: Diagnosis not present

## 2021-06-25 DIAGNOSIS — M1990 Unspecified osteoarthritis, unspecified site: Secondary | ICD-10-CM | POA: Diagnosis not present

## 2021-06-25 DIAGNOSIS — M6281 Muscle weakness (generalized): Secondary | ICD-10-CM | POA: Diagnosis not present

## 2021-06-25 DIAGNOSIS — I5042 Chronic combined systolic (congestive) and diastolic (congestive) heart failure: Secondary | ICD-10-CM | POA: Diagnosis not present

## 2021-06-25 DIAGNOSIS — R498 Other voice and resonance disorders: Secondary | ICD-10-CM | POA: Diagnosis not present

## 2021-06-25 DIAGNOSIS — Z9181 History of falling: Secondary | ICD-10-CM | POA: Diagnosis not present

## 2021-06-25 DIAGNOSIS — R278 Other lack of coordination: Secondary | ICD-10-CM | POA: Diagnosis not present

## 2021-06-25 DIAGNOSIS — R2689 Other abnormalities of gait and mobility: Secondary | ICD-10-CM | POA: Diagnosis not present

## 2021-06-25 DIAGNOSIS — R269 Unspecified abnormalities of gait and mobility: Secondary | ICD-10-CM | POA: Diagnosis not present

## 2021-06-25 DIAGNOSIS — R41841 Cognitive communication deficit: Secondary | ICD-10-CM | POA: Diagnosis not present

## 2021-06-25 DIAGNOSIS — M25561 Pain in right knee: Secondary | ICD-10-CM | POA: Diagnosis not present

## 2021-06-25 DIAGNOSIS — Z8673 Personal history of transient ischemic attack (TIA), and cerebral infarction without residual deficits: Secondary | ICD-10-CM | POA: Diagnosis not present

## 2021-06-25 DIAGNOSIS — R1312 Dysphagia, oropharyngeal phase: Secondary | ICD-10-CM | POA: Diagnosis not present

## 2021-06-30 ENCOUNTER — Ambulatory Visit (INDEPENDENT_AMBULATORY_CARE_PROVIDER_SITE_OTHER): Payer: Medicare Other

## 2021-06-30 DIAGNOSIS — M6258 Muscle wasting and atrophy, not elsewhere classified, other site: Secondary | ICD-10-CM | POA: Diagnosis not present

## 2021-06-30 DIAGNOSIS — I5042 Chronic combined systolic (congestive) and diastolic (congestive) heart failure: Secondary | ICD-10-CM | POA: Diagnosis not present

## 2021-06-30 DIAGNOSIS — M1990 Unspecified osteoarthritis, unspecified site: Secondary | ICD-10-CM | POA: Diagnosis not present

## 2021-06-30 DIAGNOSIS — Z8673 Personal history of transient ischemic attack (TIA), and cerebral infarction without residual deficits: Secondary | ICD-10-CM | POA: Diagnosis not present

## 2021-06-30 DIAGNOSIS — R2681 Unsteadiness on feet: Secondary | ICD-10-CM | POA: Diagnosis not present

## 2021-06-30 DIAGNOSIS — R2689 Other abnormalities of gait and mobility: Secondary | ICD-10-CM | POA: Diagnosis not present

## 2021-06-30 DIAGNOSIS — R498 Other voice and resonance disorders: Secondary | ICD-10-CM | POA: Diagnosis not present

## 2021-06-30 DIAGNOSIS — M6281 Muscle weakness (generalized): Secondary | ICD-10-CM | POA: Diagnosis not present

## 2021-06-30 DIAGNOSIS — I442 Atrioventricular block, complete: Secondary | ICD-10-CM | POA: Diagnosis not present

## 2021-06-30 DIAGNOSIS — R269 Unspecified abnormalities of gait and mobility: Secondary | ICD-10-CM | POA: Diagnosis not present

## 2021-06-30 DIAGNOSIS — M25561 Pain in right knee: Secondary | ICD-10-CM | POA: Diagnosis not present

## 2021-06-30 DIAGNOSIS — Z9181 History of falling: Secondary | ICD-10-CM | POA: Diagnosis not present

## 2021-06-30 DIAGNOSIS — R262 Difficulty in walking, not elsewhere classified: Secondary | ICD-10-CM | POA: Diagnosis not present

## 2021-06-30 DIAGNOSIS — R1312 Dysphagia, oropharyngeal phase: Secondary | ICD-10-CM | POA: Diagnosis not present

## 2021-06-30 DIAGNOSIS — R41841 Cognitive communication deficit: Secondary | ICD-10-CM | POA: Diagnosis not present

## 2021-06-30 DIAGNOSIS — R278 Other lack of coordination: Secondary | ICD-10-CM | POA: Diagnosis not present

## 2021-06-30 LAB — CUP PACEART REMOTE DEVICE CHECK
Battery Remaining Longevity: 53 mo
Battery Voltage: 2.95 V
Brady Statistic AP VP Percent: 99.03 %
Brady Statistic AP VS Percent: 0.24 %
Brady Statistic AS VP Percent: 0.59 %
Brady Statistic AS VS Percent: 0.14 %
Brady Statistic RA Percent Paced: 99.38 %
Brady Statistic RV Percent Paced: 99.62 %
Date Time Interrogation Session: 20221213131008
Implantable Lead Implant Date: 20190822
Implantable Lead Implant Date: 20190822
Implantable Lead Location: 753859
Implantable Lead Location: 753860
Implantable Lead Model: 3830
Implantable Lead Model: 5076
Implantable Pulse Generator Implant Date: 20190822
Lead Channel Impedance Value: 304 Ohm
Lead Channel Impedance Value: 323 Ohm
Lead Channel Impedance Value: 380 Ohm
Lead Channel Impedance Value: 456 Ohm
Lead Channel Pacing Threshold Amplitude: 0.75 V
Lead Channel Pacing Threshold Amplitude: 1.375 V
Lead Channel Pacing Threshold Pulse Width: 0.4 ms
Lead Channel Pacing Threshold Pulse Width: 0.4 ms
Lead Channel Sensing Intrinsic Amplitude: 10 mV
Lead Channel Sensing Intrinsic Amplitude: 10 mV
Lead Channel Sensing Intrinsic Amplitude: 5 mV
Lead Channel Sensing Intrinsic Amplitude: 5 mV
Lead Channel Setting Pacing Amplitude: 1.5 V
Lead Channel Setting Pacing Amplitude: 2.5 V
Lead Channel Setting Pacing Pulse Width: 1 ms
Lead Channel Setting Sensing Sensitivity: 2.8 mV

## 2021-07-02 DIAGNOSIS — M6258 Muscle wasting and atrophy, not elsewhere classified, other site: Secondary | ICD-10-CM | POA: Diagnosis not present

## 2021-07-02 DIAGNOSIS — R41841 Cognitive communication deficit: Secondary | ICD-10-CM | POA: Diagnosis not present

## 2021-07-02 DIAGNOSIS — Z8673 Personal history of transient ischemic attack (TIA), and cerebral infarction without residual deficits: Secondary | ICD-10-CM | POA: Diagnosis not present

## 2021-07-02 DIAGNOSIS — R269 Unspecified abnormalities of gait and mobility: Secondary | ICD-10-CM | POA: Diagnosis not present

## 2021-07-02 DIAGNOSIS — R2681 Unsteadiness on feet: Secondary | ICD-10-CM | POA: Diagnosis not present

## 2021-07-02 DIAGNOSIS — R498 Other voice and resonance disorders: Secondary | ICD-10-CM | POA: Diagnosis not present

## 2021-07-02 DIAGNOSIS — R262 Difficulty in walking, not elsewhere classified: Secondary | ICD-10-CM | POA: Diagnosis not present

## 2021-07-02 DIAGNOSIS — R2689 Other abnormalities of gait and mobility: Secondary | ICD-10-CM | POA: Diagnosis not present

## 2021-07-02 DIAGNOSIS — F331 Major depressive disorder, recurrent, moderate: Secondary | ICD-10-CM | POA: Diagnosis not present

## 2021-07-02 DIAGNOSIS — Z9181 History of falling: Secondary | ICD-10-CM | POA: Diagnosis not present

## 2021-07-02 DIAGNOSIS — M6281 Muscle weakness (generalized): Secondary | ICD-10-CM | POA: Diagnosis not present

## 2021-07-02 DIAGNOSIS — R278 Other lack of coordination: Secondary | ICD-10-CM | POA: Diagnosis not present

## 2021-07-02 DIAGNOSIS — M25561 Pain in right knee: Secondary | ICD-10-CM | POA: Diagnosis not present

## 2021-07-02 DIAGNOSIS — R1312 Dysphagia, oropharyngeal phase: Secondary | ICD-10-CM | POA: Diagnosis not present

## 2021-07-02 DIAGNOSIS — F411 Generalized anxiety disorder: Secondary | ICD-10-CM | POA: Diagnosis not present

## 2021-07-02 DIAGNOSIS — I5042 Chronic combined systolic (congestive) and diastolic (congestive) heart failure: Secondary | ICD-10-CM | POA: Diagnosis not present

## 2021-07-02 DIAGNOSIS — M1990 Unspecified osteoarthritis, unspecified site: Secondary | ICD-10-CM | POA: Diagnosis not present

## 2021-07-03 DIAGNOSIS — R41841 Cognitive communication deficit: Secondary | ICD-10-CM | POA: Diagnosis not present

## 2021-07-03 DIAGNOSIS — M25561 Pain in right knee: Secondary | ICD-10-CM | POA: Diagnosis not present

## 2021-07-03 DIAGNOSIS — R262 Difficulty in walking, not elsewhere classified: Secondary | ICD-10-CM | POA: Diagnosis not present

## 2021-07-03 DIAGNOSIS — M1990 Unspecified osteoarthritis, unspecified site: Secondary | ICD-10-CM | POA: Diagnosis not present

## 2021-07-03 DIAGNOSIS — Z9181 History of falling: Secondary | ICD-10-CM | POA: Diagnosis not present

## 2021-07-03 DIAGNOSIS — M6281 Muscle weakness (generalized): Secondary | ICD-10-CM | POA: Diagnosis not present

## 2021-07-03 DIAGNOSIS — Z8673 Personal history of transient ischemic attack (TIA), and cerebral infarction without residual deficits: Secondary | ICD-10-CM | POA: Diagnosis not present

## 2021-07-03 DIAGNOSIS — R2681 Unsteadiness on feet: Secondary | ICD-10-CM | POA: Diagnosis not present

## 2021-07-03 DIAGNOSIS — R498 Other voice and resonance disorders: Secondary | ICD-10-CM | POA: Diagnosis not present

## 2021-07-03 DIAGNOSIS — I5042 Chronic combined systolic (congestive) and diastolic (congestive) heart failure: Secondary | ICD-10-CM | POA: Diagnosis not present

## 2021-07-03 DIAGNOSIS — M6258 Muscle wasting and atrophy, not elsewhere classified, other site: Secondary | ICD-10-CM | POA: Diagnosis not present

## 2021-07-03 DIAGNOSIS — R1312 Dysphagia, oropharyngeal phase: Secondary | ICD-10-CM | POA: Diagnosis not present

## 2021-07-03 DIAGNOSIS — R278 Other lack of coordination: Secondary | ICD-10-CM | POA: Diagnosis not present

## 2021-07-03 DIAGNOSIS — R269 Unspecified abnormalities of gait and mobility: Secondary | ICD-10-CM | POA: Diagnosis not present

## 2021-07-03 DIAGNOSIS — R2689 Other abnormalities of gait and mobility: Secondary | ICD-10-CM | POA: Diagnosis not present

## 2021-07-08 DIAGNOSIS — M6258 Muscle wasting and atrophy, not elsewhere classified, other site: Secondary | ICD-10-CM | POA: Diagnosis not present

## 2021-07-08 DIAGNOSIS — R262 Difficulty in walking, not elsewhere classified: Secondary | ICD-10-CM | POA: Diagnosis not present

## 2021-07-08 DIAGNOSIS — M6281 Muscle weakness (generalized): Secondary | ICD-10-CM | POA: Diagnosis not present

## 2021-07-08 DIAGNOSIS — Z8673 Personal history of transient ischemic attack (TIA), and cerebral infarction without residual deficits: Secondary | ICD-10-CM | POA: Diagnosis not present

## 2021-07-08 DIAGNOSIS — M25561 Pain in right knee: Secondary | ICD-10-CM | POA: Diagnosis not present

## 2021-07-08 DIAGNOSIS — R498 Other voice and resonance disorders: Secondary | ICD-10-CM | POA: Diagnosis not present

## 2021-07-08 DIAGNOSIS — I5042 Chronic combined systolic (congestive) and diastolic (congestive) heart failure: Secondary | ICD-10-CM | POA: Diagnosis not present

## 2021-07-08 DIAGNOSIS — R278 Other lack of coordination: Secondary | ICD-10-CM | POA: Diagnosis not present

## 2021-07-08 DIAGNOSIS — Z9181 History of falling: Secondary | ICD-10-CM | POA: Diagnosis not present

## 2021-07-08 DIAGNOSIS — R1312 Dysphagia, oropharyngeal phase: Secondary | ICD-10-CM | POA: Diagnosis not present

## 2021-07-08 DIAGNOSIS — R269 Unspecified abnormalities of gait and mobility: Secondary | ICD-10-CM | POA: Diagnosis not present

## 2021-07-08 DIAGNOSIS — R2689 Other abnormalities of gait and mobility: Secondary | ICD-10-CM | POA: Diagnosis not present

## 2021-07-08 DIAGNOSIS — M1990 Unspecified osteoarthritis, unspecified site: Secondary | ICD-10-CM | POA: Diagnosis not present

## 2021-07-08 DIAGNOSIS — R41841 Cognitive communication deficit: Secondary | ICD-10-CM | POA: Diagnosis not present

## 2021-07-08 DIAGNOSIS — R2681 Unsteadiness on feet: Secondary | ICD-10-CM | POA: Diagnosis not present

## 2021-07-09 DIAGNOSIS — Z8673 Personal history of transient ischemic attack (TIA), and cerebral infarction without residual deficits: Secondary | ICD-10-CM | POA: Diagnosis not present

## 2021-07-09 DIAGNOSIS — M6281 Muscle weakness (generalized): Secondary | ICD-10-CM | POA: Diagnosis not present

## 2021-07-09 DIAGNOSIS — R278 Other lack of coordination: Secondary | ICD-10-CM | POA: Diagnosis not present

## 2021-07-09 DIAGNOSIS — R262 Difficulty in walking, not elsewhere classified: Secondary | ICD-10-CM | POA: Diagnosis not present

## 2021-07-09 DIAGNOSIS — R1312 Dysphagia, oropharyngeal phase: Secondary | ICD-10-CM | POA: Diagnosis not present

## 2021-07-09 DIAGNOSIS — M6258 Muscle wasting and atrophy, not elsewhere classified, other site: Secondary | ICD-10-CM | POA: Diagnosis not present

## 2021-07-09 DIAGNOSIS — R2681 Unsteadiness on feet: Secondary | ICD-10-CM | POA: Diagnosis not present

## 2021-07-09 DIAGNOSIS — R269 Unspecified abnormalities of gait and mobility: Secondary | ICD-10-CM | POA: Diagnosis not present

## 2021-07-09 DIAGNOSIS — R2689 Other abnormalities of gait and mobility: Secondary | ICD-10-CM | POA: Diagnosis not present

## 2021-07-09 DIAGNOSIS — Z9181 History of falling: Secondary | ICD-10-CM | POA: Diagnosis not present

## 2021-07-09 DIAGNOSIS — M25561 Pain in right knee: Secondary | ICD-10-CM | POA: Diagnosis not present

## 2021-07-09 DIAGNOSIS — R498 Other voice and resonance disorders: Secondary | ICD-10-CM | POA: Diagnosis not present

## 2021-07-09 DIAGNOSIS — I5042 Chronic combined systolic (congestive) and diastolic (congestive) heart failure: Secondary | ICD-10-CM | POA: Diagnosis not present

## 2021-07-09 DIAGNOSIS — M1990 Unspecified osteoarthritis, unspecified site: Secondary | ICD-10-CM | POA: Diagnosis not present

## 2021-07-09 DIAGNOSIS — R41841 Cognitive communication deficit: Secondary | ICD-10-CM | POA: Diagnosis not present

## 2021-07-10 DIAGNOSIS — R41841 Cognitive communication deficit: Secondary | ICD-10-CM | POA: Diagnosis not present

## 2021-07-10 DIAGNOSIS — Z9181 History of falling: Secondary | ICD-10-CM | POA: Diagnosis not present

## 2021-07-10 DIAGNOSIS — M6258 Muscle wasting and atrophy, not elsewhere classified, other site: Secondary | ICD-10-CM | POA: Diagnosis not present

## 2021-07-10 DIAGNOSIS — M6281 Muscle weakness (generalized): Secondary | ICD-10-CM | POA: Diagnosis not present

## 2021-07-10 DIAGNOSIS — R1312 Dysphagia, oropharyngeal phase: Secondary | ICD-10-CM | POA: Diagnosis not present

## 2021-07-10 DIAGNOSIS — M25561 Pain in right knee: Secondary | ICD-10-CM | POA: Diagnosis not present

## 2021-07-10 DIAGNOSIS — R2689 Other abnormalities of gait and mobility: Secondary | ICD-10-CM | POA: Diagnosis not present

## 2021-07-10 DIAGNOSIS — R262 Difficulty in walking, not elsewhere classified: Secondary | ICD-10-CM | POA: Diagnosis not present

## 2021-07-10 DIAGNOSIS — I5042 Chronic combined systolic (congestive) and diastolic (congestive) heart failure: Secondary | ICD-10-CM | POA: Diagnosis not present

## 2021-07-10 DIAGNOSIS — Z8673 Personal history of transient ischemic attack (TIA), and cerebral infarction without residual deficits: Secondary | ICD-10-CM | POA: Diagnosis not present

## 2021-07-10 DIAGNOSIS — R278 Other lack of coordination: Secondary | ICD-10-CM | POA: Diagnosis not present

## 2021-07-10 DIAGNOSIS — M1990 Unspecified osteoarthritis, unspecified site: Secondary | ICD-10-CM | POA: Diagnosis not present

## 2021-07-10 DIAGNOSIS — R498 Other voice and resonance disorders: Secondary | ICD-10-CM | POA: Diagnosis not present

## 2021-07-10 DIAGNOSIS — R269 Unspecified abnormalities of gait and mobility: Secondary | ICD-10-CM | POA: Diagnosis not present

## 2021-07-10 DIAGNOSIS — R2681 Unsteadiness on feet: Secondary | ICD-10-CM | POA: Diagnosis not present

## 2021-07-10 NOTE — Progress Notes (Signed)
Remote pacemaker transmission.   

## 2021-07-15 DIAGNOSIS — M6258 Muscle wasting and atrophy, not elsewhere classified, other site: Secondary | ICD-10-CM | POA: Diagnosis not present

## 2021-07-15 DIAGNOSIS — M1990 Unspecified osteoarthritis, unspecified site: Secondary | ICD-10-CM | POA: Diagnosis not present

## 2021-07-15 DIAGNOSIS — Z8673 Personal history of transient ischemic attack (TIA), and cerebral infarction without residual deficits: Secondary | ICD-10-CM | POA: Diagnosis not present

## 2021-07-15 DIAGNOSIS — R262 Difficulty in walking, not elsewhere classified: Secondary | ICD-10-CM | POA: Diagnosis not present

## 2021-07-15 DIAGNOSIS — R41841 Cognitive communication deficit: Secondary | ICD-10-CM | POA: Diagnosis not present

## 2021-07-15 DIAGNOSIS — R269 Unspecified abnormalities of gait and mobility: Secondary | ICD-10-CM | POA: Diagnosis not present

## 2021-07-15 DIAGNOSIS — R1312 Dysphagia, oropharyngeal phase: Secondary | ICD-10-CM | POA: Diagnosis not present

## 2021-07-15 DIAGNOSIS — R2681 Unsteadiness on feet: Secondary | ICD-10-CM | POA: Diagnosis not present

## 2021-07-15 DIAGNOSIS — R2689 Other abnormalities of gait and mobility: Secondary | ICD-10-CM | POA: Diagnosis not present

## 2021-07-15 DIAGNOSIS — I5042 Chronic combined systolic (congestive) and diastolic (congestive) heart failure: Secondary | ICD-10-CM | POA: Diagnosis not present

## 2021-07-15 DIAGNOSIS — M6281 Muscle weakness (generalized): Secondary | ICD-10-CM | POA: Diagnosis not present

## 2021-07-15 DIAGNOSIS — M25561 Pain in right knee: Secondary | ICD-10-CM | POA: Diagnosis not present

## 2021-07-15 DIAGNOSIS — R278 Other lack of coordination: Secondary | ICD-10-CM | POA: Diagnosis not present

## 2021-07-15 DIAGNOSIS — Z9181 History of falling: Secondary | ICD-10-CM | POA: Diagnosis not present

## 2021-07-15 DIAGNOSIS — R498 Other voice and resonance disorders: Secondary | ICD-10-CM | POA: Diagnosis not present

## 2021-07-16 DIAGNOSIS — R278 Other lack of coordination: Secondary | ICD-10-CM | POA: Diagnosis not present

## 2021-07-16 DIAGNOSIS — M1990 Unspecified osteoarthritis, unspecified site: Secondary | ICD-10-CM | POA: Diagnosis not present

## 2021-07-16 DIAGNOSIS — R498 Other voice and resonance disorders: Secondary | ICD-10-CM | POA: Diagnosis not present

## 2021-07-16 DIAGNOSIS — I5042 Chronic combined systolic (congestive) and diastolic (congestive) heart failure: Secondary | ICD-10-CM | POA: Diagnosis not present

## 2021-07-16 DIAGNOSIS — R262 Difficulty in walking, not elsewhere classified: Secondary | ICD-10-CM | POA: Diagnosis not present

## 2021-07-16 DIAGNOSIS — R269 Unspecified abnormalities of gait and mobility: Secondary | ICD-10-CM | POA: Diagnosis not present

## 2021-07-16 DIAGNOSIS — R2681 Unsteadiness on feet: Secondary | ICD-10-CM | POA: Diagnosis not present

## 2021-07-16 DIAGNOSIS — R41841 Cognitive communication deficit: Secondary | ICD-10-CM | POA: Diagnosis not present

## 2021-07-16 DIAGNOSIS — M6258 Muscle wasting and atrophy, not elsewhere classified, other site: Secondary | ICD-10-CM | POA: Diagnosis not present

## 2021-07-16 DIAGNOSIS — R2689 Other abnormalities of gait and mobility: Secondary | ICD-10-CM | POA: Diagnosis not present

## 2021-07-16 DIAGNOSIS — M6281 Muscle weakness (generalized): Secondary | ICD-10-CM | POA: Diagnosis not present

## 2021-07-16 DIAGNOSIS — R1312 Dysphagia, oropharyngeal phase: Secondary | ICD-10-CM | POA: Diagnosis not present

## 2021-07-16 DIAGNOSIS — Z8673 Personal history of transient ischemic attack (TIA), and cerebral infarction without residual deficits: Secondary | ICD-10-CM | POA: Diagnosis not present

## 2021-07-16 DIAGNOSIS — Z9181 History of falling: Secondary | ICD-10-CM | POA: Diagnosis not present

## 2021-07-16 DIAGNOSIS — M25561 Pain in right knee: Secondary | ICD-10-CM | POA: Diagnosis not present

## 2021-07-17 DIAGNOSIS — R269 Unspecified abnormalities of gait and mobility: Secondary | ICD-10-CM | POA: Diagnosis not present

## 2021-07-17 DIAGNOSIS — Z9181 History of falling: Secondary | ICD-10-CM | POA: Diagnosis not present

## 2021-07-17 DIAGNOSIS — R278 Other lack of coordination: Secondary | ICD-10-CM | POA: Diagnosis not present

## 2021-07-17 DIAGNOSIS — M6258 Muscle wasting and atrophy, not elsewhere classified, other site: Secondary | ICD-10-CM | POA: Diagnosis not present

## 2021-07-17 DIAGNOSIS — I5042 Chronic combined systolic (congestive) and diastolic (congestive) heart failure: Secondary | ICD-10-CM | POA: Diagnosis not present

## 2021-07-17 DIAGNOSIS — R41841 Cognitive communication deficit: Secondary | ICD-10-CM | POA: Diagnosis not present

## 2021-07-17 DIAGNOSIS — R2681 Unsteadiness on feet: Secondary | ICD-10-CM | POA: Diagnosis not present

## 2021-07-17 DIAGNOSIS — M25561 Pain in right knee: Secondary | ICD-10-CM | POA: Diagnosis not present

## 2021-07-17 DIAGNOSIS — M1990 Unspecified osteoarthritis, unspecified site: Secondary | ICD-10-CM | POA: Diagnosis not present

## 2021-07-17 DIAGNOSIS — R1312 Dysphagia, oropharyngeal phase: Secondary | ICD-10-CM | POA: Diagnosis not present

## 2021-07-17 DIAGNOSIS — R2689 Other abnormalities of gait and mobility: Secondary | ICD-10-CM | POA: Diagnosis not present

## 2021-07-17 DIAGNOSIS — R498 Other voice and resonance disorders: Secondary | ICD-10-CM | POA: Diagnosis not present

## 2021-07-17 DIAGNOSIS — R262 Difficulty in walking, not elsewhere classified: Secondary | ICD-10-CM | POA: Diagnosis not present

## 2021-07-17 DIAGNOSIS — Z8673 Personal history of transient ischemic attack (TIA), and cerebral infarction without residual deficits: Secondary | ICD-10-CM | POA: Diagnosis not present

## 2021-07-17 DIAGNOSIS — M6281 Muscle weakness (generalized): Secondary | ICD-10-CM | POA: Diagnosis not present

## 2021-07-20 DIAGNOSIS — Z9181 History of falling: Secondary | ICD-10-CM | POA: Diagnosis not present

## 2021-07-20 DIAGNOSIS — R498 Other voice and resonance disorders: Secondary | ICD-10-CM | POA: Diagnosis not present

## 2021-07-20 DIAGNOSIS — R262 Difficulty in walking, not elsewhere classified: Secondary | ICD-10-CM | POA: Diagnosis not present

## 2021-07-20 DIAGNOSIS — R1312 Dysphagia, oropharyngeal phase: Secondary | ICD-10-CM | POA: Diagnosis not present

## 2021-07-20 DIAGNOSIS — M6281 Muscle weakness (generalized): Secondary | ICD-10-CM | POA: Diagnosis not present

## 2021-07-20 DIAGNOSIS — M6258 Muscle wasting and atrophy, not elsewhere classified, other site: Secondary | ICD-10-CM | POA: Diagnosis not present

## 2021-07-20 DIAGNOSIS — I5042 Chronic combined systolic (congestive) and diastolic (congestive) heart failure: Secondary | ICD-10-CM | POA: Diagnosis not present

## 2021-07-20 DIAGNOSIS — R278 Other lack of coordination: Secondary | ICD-10-CM | POA: Diagnosis not present

## 2021-07-20 DIAGNOSIS — Z8673 Personal history of transient ischemic attack (TIA), and cerebral infarction without residual deficits: Secondary | ICD-10-CM | POA: Diagnosis not present

## 2021-07-20 DIAGNOSIS — R2689 Other abnormalities of gait and mobility: Secondary | ICD-10-CM | POA: Diagnosis not present

## 2021-07-20 DIAGNOSIS — R41841 Cognitive communication deficit: Secondary | ICD-10-CM | POA: Diagnosis not present

## 2021-07-20 DIAGNOSIS — R269 Unspecified abnormalities of gait and mobility: Secondary | ICD-10-CM | POA: Diagnosis not present

## 2021-07-20 DIAGNOSIS — R2681 Unsteadiness on feet: Secondary | ICD-10-CM | POA: Diagnosis not present

## 2021-07-20 DIAGNOSIS — M25561 Pain in right knee: Secondary | ICD-10-CM | POA: Diagnosis not present

## 2021-07-20 DIAGNOSIS — M1990 Unspecified osteoarthritis, unspecified site: Secondary | ICD-10-CM | POA: Diagnosis not present

## 2021-07-21 DIAGNOSIS — R41841 Cognitive communication deficit: Secondary | ICD-10-CM | POA: Diagnosis not present

## 2021-07-21 DIAGNOSIS — R278 Other lack of coordination: Secondary | ICD-10-CM | POA: Diagnosis not present

## 2021-07-21 DIAGNOSIS — R1312 Dysphagia, oropharyngeal phase: Secondary | ICD-10-CM | POA: Diagnosis not present

## 2021-07-21 DIAGNOSIS — M6281 Muscle weakness (generalized): Secondary | ICD-10-CM | POA: Diagnosis not present

## 2021-07-21 DIAGNOSIS — Z8673 Personal history of transient ischemic attack (TIA), and cerebral infarction without residual deficits: Secondary | ICD-10-CM | POA: Diagnosis not present

## 2021-07-21 DIAGNOSIS — M6258 Muscle wasting and atrophy, not elsewhere classified, other site: Secondary | ICD-10-CM | POA: Diagnosis not present

## 2021-07-21 DIAGNOSIS — I5042 Chronic combined systolic (congestive) and diastolic (congestive) heart failure: Secondary | ICD-10-CM | POA: Diagnosis not present

## 2021-07-21 DIAGNOSIS — R2681 Unsteadiness on feet: Secondary | ICD-10-CM | POA: Diagnosis not present

## 2021-07-21 DIAGNOSIS — R262 Difficulty in walking, not elsewhere classified: Secondary | ICD-10-CM | POA: Diagnosis not present

## 2021-07-21 DIAGNOSIS — R269 Unspecified abnormalities of gait and mobility: Secondary | ICD-10-CM | POA: Diagnosis not present

## 2021-07-21 DIAGNOSIS — R498 Other voice and resonance disorders: Secondary | ICD-10-CM | POA: Diagnosis not present

## 2021-07-21 DIAGNOSIS — R2689 Other abnormalities of gait and mobility: Secondary | ICD-10-CM | POA: Diagnosis not present

## 2021-07-21 DIAGNOSIS — Z9181 History of falling: Secondary | ICD-10-CM | POA: Diagnosis not present

## 2021-07-21 DIAGNOSIS — M1990 Unspecified osteoarthritis, unspecified site: Secondary | ICD-10-CM | POA: Diagnosis not present

## 2021-07-21 DIAGNOSIS — M25561 Pain in right knee: Secondary | ICD-10-CM | POA: Diagnosis not present

## 2021-07-22 DIAGNOSIS — F419 Anxiety disorder, unspecified: Secondary | ICD-10-CM | POA: Diagnosis not present

## 2021-07-22 DIAGNOSIS — I5042 Chronic combined systolic (congestive) and diastolic (congestive) heart failure: Secondary | ICD-10-CM | POA: Diagnosis not present

## 2021-07-22 DIAGNOSIS — M1990 Unspecified osteoarthritis, unspecified site: Secondary | ICD-10-CM | POA: Diagnosis not present

## 2021-07-22 DIAGNOSIS — F411 Generalized anxiety disorder: Secondary | ICD-10-CM | POA: Diagnosis not present

## 2021-07-22 DIAGNOSIS — Z8673 Personal history of transient ischemic attack (TIA), and cerebral infarction without residual deficits: Secondary | ICD-10-CM | POA: Diagnosis not present

## 2021-07-22 DIAGNOSIS — G250 Essential tremor: Secondary | ICD-10-CM | POA: Diagnosis not present

## 2021-07-22 DIAGNOSIS — R498 Other voice and resonance disorders: Secondary | ICD-10-CM | POA: Diagnosis not present

## 2021-07-22 DIAGNOSIS — R2681 Unsteadiness on feet: Secondary | ICD-10-CM | POA: Diagnosis not present

## 2021-07-22 DIAGNOSIS — F39 Unspecified mood [affective] disorder: Secondary | ICD-10-CM | POA: Diagnosis not present

## 2021-07-22 DIAGNOSIS — R262 Difficulty in walking, not elsewhere classified: Secondary | ICD-10-CM | POA: Diagnosis not present

## 2021-07-22 DIAGNOSIS — Z9181 History of falling: Secondary | ICD-10-CM | POA: Diagnosis not present

## 2021-07-22 DIAGNOSIS — M25561 Pain in right knee: Secondary | ICD-10-CM | POA: Diagnosis not present

## 2021-07-22 DIAGNOSIS — R2689 Other abnormalities of gait and mobility: Secondary | ICD-10-CM | POA: Diagnosis not present

## 2021-07-22 DIAGNOSIS — M6281 Muscle weakness (generalized): Secondary | ICD-10-CM | POA: Diagnosis not present

## 2021-07-22 DIAGNOSIS — R269 Unspecified abnormalities of gait and mobility: Secondary | ICD-10-CM | POA: Diagnosis not present

## 2021-07-22 DIAGNOSIS — M6258 Muscle wasting and atrophy, not elsewhere classified, other site: Secondary | ICD-10-CM | POA: Diagnosis not present

## 2021-07-22 DIAGNOSIS — F331 Major depressive disorder, recurrent, moderate: Secondary | ICD-10-CM | POA: Diagnosis not present

## 2021-07-22 DIAGNOSIS — R1312 Dysphagia, oropharyngeal phase: Secondary | ICD-10-CM | POA: Diagnosis not present

## 2021-07-22 DIAGNOSIS — R278 Other lack of coordination: Secondary | ICD-10-CM | POA: Diagnosis not present

## 2021-07-22 DIAGNOSIS — E785 Hyperlipidemia, unspecified: Secondary | ICD-10-CM | POA: Diagnosis not present

## 2021-07-22 DIAGNOSIS — R41841 Cognitive communication deficit: Secondary | ICD-10-CM | POA: Diagnosis not present

## 2021-07-23 DIAGNOSIS — Z9181 History of falling: Secondary | ICD-10-CM | POA: Diagnosis not present

## 2021-07-23 DIAGNOSIS — I5042 Chronic combined systolic (congestive) and diastolic (congestive) heart failure: Secondary | ICD-10-CM | POA: Diagnosis not present

## 2021-07-23 DIAGNOSIS — R2689 Other abnormalities of gait and mobility: Secondary | ICD-10-CM | POA: Diagnosis not present

## 2021-07-23 DIAGNOSIS — M6258 Muscle wasting and atrophy, not elsewhere classified, other site: Secondary | ICD-10-CM | POA: Diagnosis not present

## 2021-07-23 DIAGNOSIS — Z8673 Personal history of transient ischemic attack (TIA), and cerebral infarction without residual deficits: Secondary | ICD-10-CM | POA: Diagnosis not present

## 2021-07-23 DIAGNOSIS — R262 Difficulty in walking, not elsewhere classified: Secondary | ICD-10-CM | POA: Diagnosis not present

## 2021-07-23 DIAGNOSIS — F411 Generalized anxiety disorder: Secondary | ICD-10-CM | POA: Diagnosis not present

## 2021-07-23 DIAGNOSIS — G250 Essential tremor: Secondary | ICD-10-CM | POA: Diagnosis not present

## 2021-07-23 DIAGNOSIS — M1990 Unspecified osteoarthritis, unspecified site: Secondary | ICD-10-CM | POA: Diagnosis not present

## 2021-07-23 DIAGNOSIS — M6281 Muscle weakness (generalized): Secondary | ICD-10-CM | POA: Diagnosis not present

## 2021-07-23 DIAGNOSIS — M25561 Pain in right knee: Secondary | ICD-10-CM | POA: Diagnosis not present

## 2021-07-23 DIAGNOSIS — R41841 Cognitive communication deficit: Secondary | ICD-10-CM | POA: Diagnosis not present

## 2021-07-23 DIAGNOSIS — R1312 Dysphagia, oropharyngeal phase: Secondary | ICD-10-CM | POA: Diagnosis not present

## 2021-07-23 DIAGNOSIS — Z760 Encounter for issue of repeat prescription: Secondary | ICD-10-CM | POA: Diagnosis not present

## 2021-07-23 DIAGNOSIS — R2681 Unsteadiness on feet: Secondary | ICD-10-CM | POA: Diagnosis not present

## 2021-07-23 DIAGNOSIS — I10 Essential (primary) hypertension: Secondary | ICD-10-CM | POA: Diagnosis not present

## 2021-07-23 DIAGNOSIS — R269 Unspecified abnormalities of gait and mobility: Secondary | ICD-10-CM | POA: Diagnosis not present

## 2021-07-23 DIAGNOSIS — R498 Other voice and resonance disorders: Secondary | ICD-10-CM | POA: Diagnosis not present

## 2021-07-23 DIAGNOSIS — F419 Anxiety disorder, unspecified: Secondary | ICD-10-CM | POA: Diagnosis not present

## 2021-07-23 DIAGNOSIS — F331 Major depressive disorder, recurrent, moderate: Secondary | ICD-10-CM | POA: Diagnosis not present

## 2021-07-23 DIAGNOSIS — R278 Other lack of coordination: Secondary | ICD-10-CM | POA: Diagnosis not present

## 2021-07-24 DIAGNOSIS — R262 Difficulty in walking, not elsewhere classified: Secondary | ICD-10-CM | POA: Diagnosis not present

## 2021-07-24 DIAGNOSIS — R498 Other voice and resonance disorders: Secondary | ICD-10-CM | POA: Diagnosis not present

## 2021-07-24 DIAGNOSIS — R41841 Cognitive communication deficit: Secondary | ICD-10-CM | POA: Diagnosis not present

## 2021-07-24 DIAGNOSIS — M6281 Muscle weakness (generalized): Secondary | ICD-10-CM | POA: Diagnosis not present

## 2021-07-24 DIAGNOSIS — M1990 Unspecified osteoarthritis, unspecified site: Secondary | ICD-10-CM | POA: Diagnosis not present

## 2021-07-24 DIAGNOSIS — R2681 Unsteadiness on feet: Secondary | ICD-10-CM | POA: Diagnosis not present

## 2021-07-24 DIAGNOSIS — R269 Unspecified abnormalities of gait and mobility: Secondary | ICD-10-CM | POA: Diagnosis not present

## 2021-07-24 DIAGNOSIS — M6258 Muscle wasting and atrophy, not elsewhere classified, other site: Secondary | ICD-10-CM | POA: Diagnosis not present

## 2021-07-24 DIAGNOSIS — I5042 Chronic combined systolic (congestive) and diastolic (congestive) heart failure: Secondary | ICD-10-CM | POA: Diagnosis not present

## 2021-07-24 DIAGNOSIS — M25561 Pain in right knee: Secondary | ICD-10-CM | POA: Diagnosis not present

## 2021-07-24 DIAGNOSIS — R278 Other lack of coordination: Secondary | ICD-10-CM | POA: Diagnosis not present

## 2021-07-24 DIAGNOSIS — R1312 Dysphagia, oropharyngeal phase: Secondary | ICD-10-CM | POA: Diagnosis not present

## 2021-07-24 DIAGNOSIS — Z8673 Personal history of transient ischemic attack (TIA), and cerebral infarction without residual deficits: Secondary | ICD-10-CM | POA: Diagnosis not present

## 2021-07-24 DIAGNOSIS — R2689 Other abnormalities of gait and mobility: Secondary | ICD-10-CM | POA: Diagnosis not present

## 2021-07-24 DIAGNOSIS — Z9181 History of falling: Secondary | ICD-10-CM | POA: Diagnosis not present

## 2021-07-25 DIAGNOSIS — R498 Other voice and resonance disorders: Secondary | ICD-10-CM | POA: Diagnosis not present

## 2021-07-25 DIAGNOSIS — R262 Difficulty in walking, not elsewhere classified: Secondary | ICD-10-CM | POA: Diagnosis not present

## 2021-07-25 DIAGNOSIS — M1990 Unspecified osteoarthritis, unspecified site: Secondary | ICD-10-CM | POA: Diagnosis not present

## 2021-07-25 DIAGNOSIS — M25561 Pain in right knee: Secondary | ICD-10-CM | POA: Diagnosis not present

## 2021-07-25 DIAGNOSIS — R269 Unspecified abnormalities of gait and mobility: Secondary | ICD-10-CM | POA: Diagnosis not present

## 2021-07-25 DIAGNOSIS — I5042 Chronic combined systolic (congestive) and diastolic (congestive) heart failure: Secondary | ICD-10-CM | POA: Diagnosis not present

## 2021-07-25 DIAGNOSIS — M6281 Muscle weakness (generalized): Secondary | ICD-10-CM | POA: Diagnosis not present

## 2021-07-25 DIAGNOSIS — R278 Other lack of coordination: Secondary | ICD-10-CM | POA: Diagnosis not present

## 2021-07-25 DIAGNOSIS — M6258 Muscle wasting and atrophy, not elsewhere classified, other site: Secondary | ICD-10-CM | POA: Diagnosis not present

## 2021-07-25 DIAGNOSIS — R2681 Unsteadiness on feet: Secondary | ICD-10-CM | POA: Diagnosis not present

## 2021-07-25 DIAGNOSIS — R41841 Cognitive communication deficit: Secondary | ICD-10-CM | POA: Diagnosis not present

## 2021-07-25 DIAGNOSIS — R2689 Other abnormalities of gait and mobility: Secondary | ICD-10-CM | POA: Diagnosis not present

## 2021-07-25 DIAGNOSIS — Z8673 Personal history of transient ischemic attack (TIA), and cerebral infarction without residual deficits: Secondary | ICD-10-CM | POA: Diagnosis not present

## 2021-07-25 DIAGNOSIS — R1312 Dysphagia, oropharyngeal phase: Secondary | ICD-10-CM | POA: Diagnosis not present

## 2021-07-25 DIAGNOSIS — Z9181 History of falling: Secondary | ICD-10-CM | POA: Diagnosis not present

## 2021-07-27 DIAGNOSIS — M25561 Pain in right knee: Secondary | ICD-10-CM | POA: Diagnosis not present

## 2021-07-27 DIAGNOSIS — R278 Other lack of coordination: Secondary | ICD-10-CM | POA: Diagnosis not present

## 2021-07-27 DIAGNOSIS — Z9181 History of falling: Secondary | ICD-10-CM | POA: Diagnosis not present

## 2021-07-27 DIAGNOSIS — R2689 Other abnormalities of gait and mobility: Secondary | ICD-10-CM | POA: Diagnosis not present

## 2021-07-27 DIAGNOSIS — R262 Difficulty in walking, not elsewhere classified: Secondary | ICD-10-CM | POA: Diagnosis not present

## 2021-07-27 DIAGNOSIS — R498 Other voice and resonance disorders: Secondary | ICD-10-CM | POA: Diagnosis not present

## 2021-07-27 DIAGNOSIS — R2681 Unsteadiness on feet: Secondary | ICD-10-CM | POA: Diagnosis not present

## 2021-07-27 DIAGNOSIS — M1990 Unspecified osteoarthritis, unspecified site: Secondary | ICD-10-CM | POA: Diagnosis not present

## 2021-07-27 DIAGNOSIS — R269 Unspecified abnormalities of gait and mobility: Secondary | ICD-10-CM | POA: Diagnosis not present

## 2021-07-27 DIAGNOSIS — M6281 Muscle weakness (generalized): Secondary | ICD-10-CM | POA: Diagnosis not present

## 2021-07-27 DIAGNOSIS — R1312 Dysphagia, oropharyngeal phase: Secondary | ICD-10-CM | POA: Diagnosis not present

## 2021-07-27 DIAGNOSIS — Z8673 Personal history of transient ischemic attack (TIA), and cerebral infarction without residual deficits: Secondary | ICD-10-CM | POA: Diagnosis not present

## 2021-07-27 DIAGNOSIS — R41841 Cognitive communication deficit: Secondary | ICD-10-CM | POA: Diagnosis not present

## 2021-07-27 DIAGNOSIS — I5042 Chronic combined systolic (congestive) and diastolic (congestive) heart failure: Secondary | ICD-10-CM | POA: Diagnosis not present

## 2021-07-27 DIAGNOSIS — M6258 Muscle wasting and atrophy, not elsewhere classified, other site: Secondary | ICD-10-CM | POA: Diagnosis not present

## 2021-07-28 DIAGNOSIS — Z9181 History of falling: Secondary | ICD-10-CM | POA: Diagnosis not present

## 2021-07-28 DIAGNOSIS — R262 Difficulty in walking, not elsewhere classified: Secondary | ICD-10-CM | POA: Diagnosis not present

## 2021-07-28 DIAGNOSIS — R498 Other voice and resonance disorders: Secondary | ICD-10-CM | POA: Diagnosis not present

## 2021-07-28 DIAGNOSIS — R278 Other lack of coordination: Secondary | ICD-10-CM | POA: Diagnosis not present

## 2021-07-28 DIAGNOSIS — M25561 Pain in right knee: Secondary | ICD-10-CM | POA: Diagnosis not present

## 2021-07-28 DIAGNOSIS — M6258 Muscle wasting and atrophy, not elsewhere classified, other site: Secondary | ICD-10-CM | POA: Diagnosis not present

## 2021-07-28 DIAGNOSIS — R2681 Unsteadiness on feet: Secondary | ICD-10-CM | POA: Diagnosis not present

## 2021-07-28 DIAGNOSIS — Z8673 Personal history of transient ischemic attack (TIA), and cerebral infarction without residual deficits: Secondary | ICD-10-CM | POA: Diagnosis not present

## 2021-07-28 DIAGNOSIS — R2689 Other abnormalities of gait and mobility: Secondary | ICD-10-CM | POA: Diagnosis not present

## 2021-07-28 DIAGNOSIS — R269 Unspecified abnormalities of gait and mobility: Secondary | ICD-10-CM | POA: Diagnosis not present

## 2021-07-28 DIAGNOSIS — I5042 Chronic combined systolic (congestive) and diastolic (congestive) heart failure: Secondary | ICD-10-CM | POA: Diagnosis not present

## 2021-07-28 DIAGNOSIS — R1312 Dysphagia, oropharyngeal phase: Secondary | ICD-10-CM | POA: Diagnosis not present

## 2021-07-28 DIAGNOSIS — R41841 Cognitive communication deficit: Secondary | ICD-10-CM | POA: Diagnosis not present

## 2021-07-28 DIAGNOSIS — M1990 Unspecified osteoarthritis, unspecified site: Secondary | ICD-10-CM | POA: Diagnosis not present

## 2021-07-28 DIAGNOSIS — M6281 Muscle weakness (generalized): Secondary | ICD-10-CM | POA: Diagnosis not present

## 2021-07-29 DIAGNOSIS — M1990 Unspecified osteoarthritis, unspecified site: Secondary | ICD-10-CM | POA: Diagnosis not present

## 2021-07-29 DIAGNOSIS — I5042 Chronic combined systolic (congestive) and diastolic (congestive) heart failure: Secondary | ICD-10-CM | POA: Diagnosis not present

## 2021-07-29 DIAGNOSIS — R2689 Other abnormalities of gait and mobility: Secondary | ICD-10-CM | POA: Diagnosis not present

## 2021-07-29 DIAGNOSIS — Z8673 Personal history of transient ischemic attack (TIA), and cerebral infarction without residual deficits: Secondary | ICD-10-CM | POA: Diagnosis not present

## 2021-07-29 DIAGNOSIS — R278 Other lack of coordination: Secondary | ICD-10-CM | POA: Diagnosis not present

## 2021-07-29 DIAGNOSIS — R269 Unspecified abnormalities of gait and mobility: Secondary | ICD-10-CM | POA: Diagnosis not present

## 2021-07-29 DIAGNOSIS — M25561 Pain in right knee: Secondary | ICD-10-CM | POA: Diagnosis not present

## 2021-07-29 DIAGNOSIS — R262 Difficulty in walking, not elsewhere classified: Secondary | ICD-10-CM | POA: Diagnosis not present

## 2021-07-29 DIAGNOSIS — M6281 Muscle weakness (generalized): Secondary | ICD-10-CM | POA: Diagnosis not present

## 2021-07-29 DIAGNOSIS — Z9181 History of falling: Secondary | ICD-10-CM | POA: Diagnosis not present

## 2021-07-29 DIAGNOSIS — R498 Other voice and resonance disorders: Secondary | ICD-10-CM | POA: Diagnosis not present

## 2021-07-29 DIAGNOSIS — M6258 Muscle wasting and atrophy, not elsewhere classified, other site: Secondary | ICD-10-CM | POA: Diagnosis not present

## 2021-07-29 DIAGNOSIS — R1312 Dysphagia, oropharyngeal phase: Secondary | ICD-10-CM | POA: Diagnosis not present

## 2021-07-29 DIAGNOSIS — R2681 Unsteadiness on feet: Secondary | ICD-10-CM | POA: Diagnosis not present

## 2021-07-29 DIAGNOSIS — R41841 Cognitive communication deficit: Secondary | ICD-10-CM | POA: Diagnosis not present

## 2021-07-30 DIAGNOSIS — M6258 Muscle wasting and atrophy, not elsewhere classified, other site: Secondary | ICD-10-CM | POA: Diagnosis not present

## 2021-07-30 DIAGNOSIS — R2681 Unsteadiness on feet: Secondary | ICD-10-CM | POA: Diagnosis not present

## 2021-07-30 DIAGNOSIS — R2689 Other abnormalities of gait and mobility: Secondary | ICD-10-CM | POA: Diagnosis not present

## 2021-07-30 DIAGNOSIS — R262 Difficulty in walking, not elsewhere classified: Secondary | ICD-10-CM | POA: Diagnosis not present

## 2021-07-30 DIAGNOSIS — M25561 Pain in right knee: Secondary | ICD-10-CM | POA: Diagnosis not present

## 2021-07-30 DIAGNOSIS — R278 Other lack of coordination: Secondary | ICD-10-CM | POA: Diagnosis not present

## 2021-07-30 DIAGNOSIS — I5042 Chronic combined systolic (congestive) and diastolic (congestive) heart failure: Secondary | ICD-10-CM | POA: Diagnosis not present

## 2021-07-30 DIAGNOSIS — R41841 Cognitive communication deficit: Secondary | ICD-10-CM | POA: Diagnosis not present

## 2021-07-30 DIAGNOSIS — R498 Other voice and resonance disorders: Secondary | ICD-10-CM | POA: Diagnosis not present

## 2021-07-30 DIAGNOSIS — Z9181 History of falling: Secondary | ICD-10-CM | POA: Diagnosis not present

## 2021-07-30 DIAGNOSIS — Z8673 Personal history of transient ischemic attack (TIA), and cerebral infarction without residual deficits: Secondary | ICD-10-CM | POA: Diagnosis not present

## 2021-07-30 DIAGNOSIS — M1990 Unspecified osteoarthritis, unspecified site: Secondary | ICD-10-CM | POA: Diagnosis not present

## 2021-07-30 DIAGNOSIS — R1312 Dysphagia, oropharyngeal phase: Secondary | ICD-10-CM | POA: Diagnosis not present

## 2021-07-30 DIAGNOSIS — R269 Unspecified abnormalities of gait and mobility: Secondary | ICD-10-CM | POA: Diagnosis not present

## 2021-07-30 DIAGNOSIS — M6281 Muscle weakness (generalized): Secondary | ICD-10-CM | POA: Diagnosis not present

## 2021-08-03 DIAGNOSIS — R2689 Other abnormalities of gait and mobility: Secondary | ICD-10-CM | POA: Diagnosis not present

## 2021-08-03 DIAGNOSIS — M1990 Unspecified osteoarthritis, unspecified site: Secondary | ICD-10-CM | POA: Diagnosis not present

## 2021-08-03 DIAGNOSIS — M2041 Other hammer toe(s) (acquired), right foot: Secondary | ICD-10-CM | POA: Diagnosis not present

## 2021-08-03 DIAGNOSIS — R2681 Unsteadiness on feet: Secondary | ICD-10-CM | POA: Diagnosis not present

## 2021-08-03 DIAGNOSIS — I5042 Chronic combined systolic (congestive) and diastolic (congestive) heart failure: Secondary | ICD-10-CM | POA: Diagnosis not present

## 2021-08-03 DIAGNOSIS — R278 Other lack of coordination: Secondary | ICD-10-CM | POA: Diagnosis not present

## 2021-08-03 DIAGNOSIS — Z8673 Personal history of transient ischemic attack (TIA), and cerebral infarction without residual deficits: Secondary | ICD-10-CM | POA: Diagnosis not present

## 2021-08-03 DIAGNOSIS — L603 Nail dystrophy: Secondary | ICD-10-CM | POA: Diagnosis not present

## 2021-08-03 DIAGNOSIS — M6258 Muscle wasting and atrophy, not elsewhere classified, other site: Secondary | ICD-10-CM | POA: Diagnosis not present

## 2021-08-03 DIAGNOSIS — M25561 Pain in right knee: Secondary | ICD-10-CM | POA: Diagnosis not present

## 2021-08-03 DIAGNOSIS — M6281 Muscle weakness (generalized): Secondary | ICD-10-CM | POA: Diagnosis not present

## 2021-08-03 DIAGNOSIS — R262 Difficulty in walking, not elsewhere classified: Secondary | ICD-10-CM | POA: Diagnosis not present

## 2021-08-03 DIAGNOSIS — R498 Other voice and resonance disorders: Secondary | ICD-10-CM | POA: Diagnosis not present

## 2021-08-03 DIAGNOSIS — R1312 Dysphagia, oropharyngeal phase: Secondary | ICD-10-CM | POA: Diagnosis not present

## 2021-08-03 DIAGNOSIS — I739 Peripheral vascular disease, unspecified: Secondary | ICD-10-CM | POA: Diagnosis not present

## 2021-08-03 DIAGNOSIS — Z9181 History of falling: Secondary | ICD-10-CM | POA: Diagnosis not present

## 2021-08-03 DIAGNOSIS — B351 Tinea unguium: Secondary | ICD-10-CM | POA: Diagnosis not present

## 2021-08-03 DIAGNOSIS — R41841 Cognitive communication deficit: Secondary | ICD-10-CM | POA: Diagnosis not present

## 2021-08-03 DIAGNOSIS — R269 Unspecified abnormalities of gait and mobility: Secondary | ICD-10-CM | POA: Diagnosis not present

## 2021-08-04 DIAGNOSIS — R1312 Dysphagia, oropharyngeal phase: Secondary | ICD-10-CM | POA: Diagnosis not present

## 2021-08-04 DIAGNOSIS — I5042 Chronic combined systolic (congestive) and diastolic (congestive) heart failure: Secondary | ICD-10-CM | POA: Diagnosis not present

## 2021-08-04 DIAGNOSIS — R2689 Other abnormalities of gait and mobility: Secondary | ICD-10-CM | POA: Diagnosis not present

## 2021-08-04 DIAGNOSIS — R498 Other voice and resonance disorders: Secondary | ICD-10-CM | POA: Diagnosis not present

## 2021-08-04 DIAGNOSIS — M6258 Muscle wasting and atrophy, not elsewhere classified, other site: Secondary | ICD-10-CM | POA: Diagnosis not present

## 2021-08-04 DIAGNOSIS — R2681 Unsteadiness on feet: Secondary | ICD-10-CM | POA: Diagnosis not present

## 2021-08-04 DIAGNOSIS — R269 Unspecified abnormalities of gait and mobility: Secondary | ICD-10-CM | POA: Diagnosis not present

## 2021-08-04 DIAGNOSIS — M1990 Unspecified osteoarthritis, unspecified site: Secondary | ICD-10-CM | POA: Diagnosis not present

## 2021-08-04 DIAGNOSIS — Z8673 Personal history of transient ischemic attack (TIA), and cerebral infarction without residual deficits: Secondary | ICD-10-CM | POA: Diagnosis not present

## 2021-08-04 DIAGNOSIS — R262 Difficulty in walking, not elsewhere classified: Secondary | ICD-10-CM | POA: Diagnosis not present

## 2021-08-04 DIAGNOSIS — Z9181 History of falling: Secondary | ICD-10-CM | POA: Diagnosis not present

## 2021-08-04 DIAGNOSIS — R41841 Cognitive communication deficit: Secondary | ICD-10-CM | POA: Diagnosis not present

## 2021-08-04 DIAGNOSIS — R278 Other lack of coordination: Secondary | ICD-10-CM | POA: Diagnosis not present

## 2021-08-04 DIAGNOSIS — M25561 Pain in right knee: Secondary | ICD-10-CM | POA: Diagnosis not present

## 2021-08-04 DIAGNOSIS — M6281 Muscle weakness (generalized): Secondary | ICD-10-CM | POA: Diagnosis not present

## 2021-08-05 DIAGNOSIS — M6258 Muscle wasting and atrophy, not elsewhere classified, other site: Secondary | ICD-10-CM | POA: Diagnosis not present

## 2021-08-05 DIAGNOSIS — R269 Unspecified abnormalities of gait and mobility: Secondary | ICD-10-CM | POA: Diagnosis not present

## 2021-08-05 DIAGNOSIS — R1312 Dysphagia, oropharyngeal phase: Secondary | ICD-10-CM | POA: Diagnosis not present

## 2021-08-05 DIAGNOSIS — R41841 Cognitive communication deficit: Secondary | ICD-10-CM | POA: Diagnosis not present

## 2021-08-05 DIAGNOSIS — M1990 Unspecified osteoarthritis, unspecified site: Secondary | ICD-10-CM | POA: Diagnosis not present

## 2021-08-05 DIAGNOSIS — Z8673 Personal history of transient ischemic attack (TIA), and cerebral infarction without residual deficits: Secondary | ICD-10-CM | POA: Diagnosis not present

## 2021-08-05 DIAGNOSIS — M25561 Pain in right knee: Secondary | ICD-10-CM | POA: Diagnosis not present

## 2021-08-05 DIAGNOSIS — I5042 Chronic combined systolic (congestive) and diastolic (congestive) heart failure: Secondary | ICD-10-CM | POA: Diagnosis not present

## 2021-08-05 DIAGNOSIS — R498 Other voice and resonance disorders: Secondary | ICD-10-CM | POA: Diagnosis not present

## 2021-08-05 DIAGNOSIS — R278 Other lack of coordination: Secondary | ICD-10-CM | POA: Diagnosis not present

## 2021-08-05 DIAGNOSIS — R2681 Unsteadiness on feet: Secondary | ICD-10-CM | POA: Diagnosis not present

## 2021-08-05 DIAGNOSIS — Z9181 History of falling: Secondary | ICD-10-CM | POA: Diagnosis not present

## 2021-08-05 DIAGNOSIS — R2689 Other abnormalities of gait and mobility: Secondary | ICD-10-CM | POA: Diagnosis not present

## 2021-08-05 DIAGNOSIS — M6281 Muscle weakness (generalized): Secondary | ICD-10-CM | POA: Diagnosis not present

## 2021-08-05 DIAGNOSIS — R262 Difficulty in walking, not elsewhere classified: Secondary | ICD-10-CM | POA: Diagnosis not present

## 2021-08-06 DIAGNOSIS — M1990 Unspecified osteoarthritis, unspecified site: Secondary | ICD-10-CM | POA: Diagnosis not present

## 2021-08-06 DIAGNOSIS — F331 Major depressive disorder, recurrent, moderate: Secondary | ICD-10-CM | POA: Diagnosis not present

## 2021-08-06 DIAGNOSIS — Z9181 History of falling: Secondary | ICD-10-CM | POA: Diagnosis not present

## 2021-08-06 DIAGNOSIS — R262 Difficulty in walking, not elsewhere classified: Secondary | ICD-10-CM | POA: Diagnosis not present

## 2021-08-06 DIAGNOSIS — R278 Other lack of coordination: Secondary | ICD-10-CM | POA: Diagnosis not present

## 2021-08-06 DIAGNOSIS — R2689 Other abnormalities of gait and mobility: Secondary | ICD-10-CM | POA: Diagnosis not present

## 2021-08-06 DIAGNOSIS — M6281 Muscle weakness (generalized): Secondary | ICD-10-CM | POA: Diagnosis not present

## 2021-08-06 DIAGNOSIS — R41841 Cognitive communication deficit: Secondary | ICD-10-CM | POA: Diagnosis not present

## 2021-08-06 DIAGNOSIS — Z8673 Personal history of transient ischemic attack (TIA), and cerebral infarction without residual deficits: Secondary | ICD-10-CM | POA: Diagnosis not present

## 2021-08-06 DIAGNOSIS — R2681 Unsteadiness on feet: Secondary | ICD-10-CM | POA: Diagnosis not present

## 2021-08-06 DIAGNOSIS — F411 Generalized anxiety disorder: Secondary | ICD-10-CM | POA: Diagnosis not present

## 2021-08-06 DIAGNOSIS — R498 Other voice and resonance disorders: Secondary | ICD-10-CM | POA: Diagnosis not present

## 2021-08-06 DIAGNOSIS — R1312 Dysphagia, oropharyngeal phase: Secondary | ICD-10-CM | POA: Diagnosis not present

## 2021-08-06 DIAGNOSIS — M25561 Pain in right knee: Secondary | ICD-10-CM | POA: Diagnosis not present

## 2021-08-06 DIAGNOSIS — R269 Unspecified abnormalities of gait and mobility: Secondary | ICD-10-CM | POA: Diagnosis not present

## 2021-08-06 DIAGNOSIS — M6258 Muscle wasting and atrophy, not elsewhere classified, other site: Secondary | ICD-10-CM | POA: Diagnosis not present

## 2021-08-06 DIAGNOSIS — I5042 Chronic combined systolic (congestive) and diastolic (congestive) heart failure: Secondary | ICD-10-CM | POA: Diagnosis not present

## 2021-08-07 DIAGNOSIS — R1312 Dysphagia, oropharyngeal phase: Secondary | ICD-10-CM | POA: Diagnosis not present

## 2021-08-07 DIAGNOSIS — Z8673 Personal history of transient ischemic attack (TIA), and cerebral infarction without residual deficits: Secondary | ICD-10-CM | POA: Diagnosis not present

## 2021-08-07 DIAGNOSIS — R41841 Cognitive communication deficit: Secondary | ICD-10-CM | POA: Diagnosis not present

## 2021-08-07 DIAGNOSIS — R269 Unspecified abnormalities of gait and mobility: Secondary | ICD-10-CM | POA: Diagnosis not present

## 2021-08-07 DIAGNOSIS — M1990 Unspecified osteoarthritis, unspecified site: Secondary | ICD-10-CM | POA: Diagnosis not present

## 2021-08-07 DIAGNOSIS — R278 Other lack of coordination: Secondary | ICD-10-CM | POA: Diagnosis not present

## 2021-08-07 DIAGNOSIS — R2681 Unsteadiness on feet: Secondary | ICD-10-CM | POA: Diagnosis not present

## 2021-08-07 DIAGNOSIS — M6281 Muscle weakness (generalized): Secondary | ICD-10-CM | POA: Diagnosis not present

## 2021-08-07 DIAGNOSIS — Z9181 History of falling: Secondary | ICD-10-CM | POA: Diagnosis not present

## 2021-08-07 DIAGNOSIS — M25561 Pain in right knee: Secondary | ICD-10-CM | POA: Diagnosis not present

## 2021-08-07 DIAGNOSIS — I5042 Chronic combined systolic (congestive) and diastolic (congestive) heart failure: Secondary | ICD-10-CM | POA: Diagnosis not present

## 2021-08-07 DIAGNOSIS — R262 Difficulty in walking, not elsewhere classified: Secondary | ICD-10-CM | POA: Diagnosis not present

## 2021-08-07 DIAGNOSIS — R2689 Other abnormalities of gait and mobility: Secondary | ICD-10-CM | POA: Diagnosis not present

## 2021-08-07 DIAGNOSIS — M6258 Muscle wasting and atrophy, not elsewhere classified, other site: Secondary | ICD-10-CM | POA: Diagnosis not present

## 2021-08-07 DIAGNOSIS — R498 Other voice and resonance disorders: Secondary | ICD-10-CM | POA: Diagnosis not present

## 2021-08-08 DIAGNOSIS — R269 Unspecified abnormalities of gait and mobility: Secondary | ICD-10-CM | POA: Diagnosis not present

## 2021-08-08 DIAGNOSIS — M25561 Pain in right knee: Secondary | ICD-10-CM | POA: Diagnosis not present

## 2021-08-08 DIAGNOSIS — R2681 Unsteadiness on feet: Secondary | ICD-10-CM | POA: Diagnosis not present

## 2021-08-08 DIAGNOSIS — R262 Difficulty in walking, not elsewhere classified: Secondary | ICD-10-CM | POA: Diagnosis not present

## 2021-08-08 DIAGNOSIS — R1312 Dysphagia, oropharyngeal phase: Secondary | ICD-10-CM | POA: Diagnosis not present

## 2021-08-08 DIAGNOSIS — Z8673 Personal history of transient ischemic attack (TIA), and cerebral infarction without residual deficits: Secondary | ICD-10-CM | POA: Diagnosis not present

## 2021-08-08 DIAGNOSIS — M1990 Unspecified osteoarthritis, unspecified site: Secondary | ICD-10-CM | POA: Diagnosis not present

## 2021-08-08 DIAGNOSIS — R2689 Other abnormalities of gait and mobility: Secondary | ICD-10-CM | POA: Diagnosis not present

## 2021-08-08 DIAGNOSIS — R278 Other lack of coordination: Secondary | ICD-10-CM | POA: Diagnosis not present

## 2021-08-08 DIAGNOSIS — Z9181 History of falling: Secondary | ICD-10-CM | POA: Diagnosis not present

## 2021-08-08 DIAGNOSIS — R498 Other voice and resonance disorders: Secondary | ICD-10-CM | POA: Diagnosis not present

## 2021-08-08 DIAGNOSIS — I5042 Chronic combined systolic (congestive) and diastolic (congestive) heart failure: Secondary | ICD-10-CM | POA: Diagnosis not present

## 2021-08-08 DIAGNOSIS — M6258 Muscle wasting and atrophy, not elsewhere classified, other site: Secondary | ICD-10-CM | POA: Diagnosis not present

## 2021-08-08 DIAGNOSIS — M6281 Muscle weakness (generalized): Secondary | ICD-10-CM | POA: Diagnosis not present

## 2021-08-08 DIAGNOSIS — R41841 Cognitive communication deficit: Secondary | ICD-10-CM | POA: Diagnosis not present

## 2021-08-10 DIAGNOSIS — M6281 Muscle weakness (generalized): Secondary | ICD-10-CM | POA: Diagnosis not present

## 2021-08-10 DIAGNOSIS — R269 Unspecified abnormalities of gait and mobility: Secondary | ICD-10-CM | POA: Diagnosis not present

## 2021-08-10 DIAGNOSIS — R278 Other lack of coordination: Secondary | ICD-10-CM | POA: Diagnosis not present

## 2021-08-10 DIAGNOSIS — M6258 Muscle wasting and atrophy, not elsewhere classified, other site: Secondary | ICD-10-CM | POA: Diagnosis not present

## 2021-08-10 DIAGNOSIS — R41841 Cognitive communication deficit: Secondary | ICD-10-CM | POA: Diagnosis not present

## 2021-08-10 DIAGNOSIS — R2689 Other abnormalities of gait and mobility: Secondary | ICD-10-CM | POA: Diagnosis not present

## 2021-08-10 DIAGNOSIS — R262 Difficulty in walking, not elsewhere classified: Secondary | ICD-10-CM | POA: Diagnosis not present

## 2021-08-10 DIAGNOSIS — Z9181 History of falling: Secondary | ICD-10-CM | POA: Diagnosis not present

## 2021-08-10 DIAGNOSIS — M1990 Unspecified osteoarthritis, unspecified site: Secondary | ICD-10-CM | POA: Diagnosis not present

## 2021-08-10 DIAGNOSIS — M25561 Pain in right knee: Secondary | ICD-10-CM | POA: Diagnosis not present

## 2021-08-10 DIAGNOSIS — I5042 Chronic combined systolic (congestive) and diastolic (congestive) heart failure: Secondary | ICD-10-CM | POA: Diagnosis not present

## 2021-08-10 DIAGNOSIS — R1312 Dysphagia, oropharyngeal phase: Secondary | ICD-10-CM | POA: Diagnosis not present

## 2021-08-10 DIAGNOSIS — R2681 Unsteadiness on feet: Secondary | ICD-10-CM | POA: Diagnosis not present

## 2021-08-10 DIAGNOSIS — Z8673 Personal history of transient ischemic attack (TIA), and cerebral infarction without residual deficits: Secondary | ICD-10-CM | POA: Diagnosis not present

## 2021-08-10 DIAGNOSIS — R498 Other voice and resonance disorders: Secondary | ICD-10-CM | POA: Diagnosis not present

## 2021-08-11 DIAGNOSIS — Z9181 History of falling: Secondary | ICD-10-CM | POA: Diagnosis not present

## 2021-08-11 DIAGNOSIS — M6281 Muscle weakness (generalized): Secondary | ICD-10-CM | POA: Diagnosis not present

## 2021-08-11 DIAGNOSIS — R2681 Unsteadiness on feet: Secondary | ICD-10-CM | POA: Diagnosis not present

## 2021-08-11 DIAGNOSIS — R262 Difficulty in walking, not elsewhere classified: Secondary | ICD-10-CM | POA: Diagnosis not present

## 2021-08-11 DIAGNOSIS — R498 Other voice and resonance disorders: Secondary | ICD-10-CM | POA: Diagnosis not present

## 2021-08-11 DIAGNOSIS — I5042 Chronic combined systolic (congestive) and diastolic (congestive) heart failure: Secondary | ICD-10-CM | POA: Diagnosis not present

## 2021-08-11 DIAGNOSIS — R2689 Other abnormalities of gait and mobility: Secondary | ICD-10-CM | POA: Diagnosis not present

## 2021-08-11 DIAGNOSIS — R269 Unspecified abnormalities of gait and mobility: Secondary | ICD-10-CM | POA: Diagnosis not present

## 2021-08-11 DIAGNOSIS — M6258 Muscle wasting and atrophy, not elsewhere classified, other site: Secondary | ICD-10-CM | POA: Diagnosis not present

## 2021-08-11 DIAGNOSIS — R41841 Cognitive communication deficit: Secondary | ICD-10-CM | POA: Diagnosis not present

## 2021-08-11 DIAGNOSIS — M1990 Unspecified osteoarthritis, unspecified site: Secondary | ICD-10-CM | POA: Diagnosis not present

## 2021-08-11 DIAGNOSIS — M25561 Pain in right knee: Secondary | ICD-10-CM | POA: Diagnosis not present

## 2021-08-11 DIAGNOSIS — R1312 Dysphagia, oropharyngeal phase: Secondary | ICD-10-CM | POA: Diagnosis not present

## 2021-08-11 DIAGNOSIS — R278 Other lack of coordination: Secondary | ICD-10-CM | POA: Diagnosis not present

## 2021-08-11 DIAGNOSIS — Z8673 Personal history of transient ischemic attack (TIA), and cerebral infarction without residual deficits: Secondary | ICD-10-CM | POA: Diagnosis not present

## 2021-08-12 DIAGNOSIS — R41841 Cognitive communication deficit: Secondary | ICD-10-CM | POA: Diagnosis not present

## 2021-08-12 DIAGNOSIS — R269 Unspecified abnormalities of gait and mobility: Secondary | ICD-10-CM | POA: Diagnosis not present

## 2021-08-12 DIAGNOSIS — M6258 Muscle wasting and atrophy, not elsewhere classified, other site: Secondary | ICD-10-CM | POA: Diagnosis not present

## 2021-08-12 DIAGNOSIS — R498 Other voice and resonance disorders: Secondary | ICD-10-CM | POA: Diagnosis not present

## 2021-08-12 DIAGNOSIS — R2689 Other abnormalities of gait and mobility: Secondary | ICD-10-CM | POA: Diagnosis not present

## 2021-08-12 DIAGNOSIS — Z9181 History of falling: Secondary | ICD-10-CM | POA: Diagnosis not present

## 2021-08-12 DIAGNOSIS — I5042 Chronic combined systolic (congestive) and diastolic (congestive) heart failure: Secondary | ICD-10-CM | POA: Diagnosis not present

## 2021-08-12 DIAGNOSIS — R1312 Dysphagia, oropharyngeal phase: Secondary | ICD-10-CM | POA: Diagnosis not present

## 2021-08-12 DIAGNOSIS — M1990 Unspecified osteoarthritis, unspecified site: Secondary | ICD-10-CM | POA: Diagnosis not present

## 2021-08-12 DIAGNOSIS — Z8673 Personal history of transient ischemic attack (TIA), and cerebral infarction without residual deficits: Secondary | ICD-10-CM | POA: Diagnosis not present

## 2021-08-12 DIAGNOSIS — M25561 Pain in right knee: Secondary | ICD-10-CM | POA: Diagnosis not present

## 2021-08-12 DIAGNOSIS — R2681 Unsteadiness on feet: Secondary | ICD-10-CM | POA: Diagnosis not present

## 2021-08-12 DIAGNOSIS — M6281 Muscle weakness (generalized): Secondary | ICD-10-CM | POA: Diagnosis not present

## 2021-08-12 DIAGNOSIS — R278 Other lack of coordination: Secondary | ICD-10-CM | POA: Diagnosis not present

## 2021-08-12 DIAGNOSIS — R262 Difficulty in walking, not elsewhere classified: Secondary | ICD-10-CM | POA: Diagnosis not present

## 2021-08-13 DIAGNOSIS — Z8673 Personal history of transient ischemic attack (TIA), and cerebral infarction without residual deficits: Secondary | ICD-10-CM | POA: Diagnosis not present

## 2021-08-13 DIAGNOSIS — R278 Other lack of coordination: Secondary | ICD-10-CM | POA: Diagnosis not present

## 2021-08-13 DIAGNOSIS — M6258 Muscle wasting and atrophy, not elsewhere classified, other site: Secondary | ICD-10-CM | POA: Diagnosis not present

## 2021-08-13 DIAGNOSIS — R2681 Unsteadiness on feet: Secondary | ICD-10-CM | POA: Diagnosis not present

## 2021-08-13 DIAGNOSIS — M1990 Unspecified osteoarthritis, unspecified site: Secondary | ICD-10-CM | POA: Diagnosis not present

## 2021-08-13 DIAGNOSIS — R2689 Other abnormalities of gait and mobility: Secondary | ICD-10-CM | POA: Diagnosis not present

## 2021-08-13 DIAGNOSIS — M6281 Muscle weakness (generalized): Secondary | ICD-10-CM | POA: Diagnosis not present

## 2021-08-13 DIAGNOSIS — R41841 Cognitive communication deficit: Secondary | ICD-10-CM | POA: Diagnosis not present

## 2021-08-13 DIAGNOSIS — Z9181 History of falling: Secondary | ICD-10-CM | POA: Diagnosis not present

## 2021-08-13 DIAGNOSIS — I5042 Chronic combined systolic (congestive) and diastolic (congestive) heart failure: Secondary | ICD-10-CM | POA: Diagnosis not present

## 2021-08-13 DIAGNOSIS — R269 Unspecified abnormalities of gait and mobility: Secondary | ICD-10-CM | POA: Diagnosis not present

## 2021-08-13 DIAGNOSIS — R262 Difficulty in walking, not elsewhere classified: Secondary | ICD-10-CM | POA: Diagnosis not present

## 2021-08-13 DIAGNOSIS — R1312 Dysphagia, oropharyngeal phase: Secondary | ICD-10-CM | POA: Diagnosis not present

## 2021-08-13 DIAGNOSIS — M25561 Pain in right knee: Secondary | ICD-10-CM | POA: Diagnosis not present

## 2021-08-13 DIAGNOSIS — R498 Other voice and resonance disorders: Secondary | ICD-10-CM | POA: Diagnosis not present

## 2021-08-14 DIAGNOSIS — R262 Difficulty in walking, not elsewhere classified: Secondary | ICD-10-CM | POA: Diagnosis not present

## 2021-08-14 DIAGNOSIS — R2689 Other abnormalities of gait and mobility: Secondary | ICD-10-CM | POA: Diagnosis not present

## 2021-08-14 DIAGNOSIS — R498 Other voice and resonance disorders: Secondary | ICD-10-CM | POA: Diagnosis not present

## 2021-08-14 DIAGNOSIS — R278 Other lack of coordination: Secondary | ICD-10-CM | POA: Diagnosis not present

## 2021-08-14 DIAGNOSIS — Z9181 History of falling: Secondary | ICD-10-CM | POA: Diagnosis not present

## 2021-08-14 DIAGNOSIS — I5042 Chronic combined systolic (congestive) and diastolic (congestive) heart failure: Secondary | ICD-10-CM | POA: Diagnosis not present

## 2021-08-14 DIAGNOSIS — Z8673 Personal history of transient ischemic attack (TIA), and cerebral infarction without residual deficits: Secondary | ICD-10-CM | POA: Diagnosis not present

## 2021-08-14 DIAGNOSIS — M6258 Muscle wasting and atrophy, not elsewhere classified, other site: Secondary | ICD-10-CM | POA: Diagnosis not present

## 2021-08-14 DIAGNOSIS — M25561 Pain in right knee: Secondary | ICD-10-CM | POA: Diagnosis not present

## 2021-08-14 DIAGNOSIS — R41841 Cognitive communication deficit: Secondary | ICD-10-CM | POA: Diagnosis not present

## 2021-08-14 DIAGNOSIS — M6281 Muscle weakness (generalized): Secondary | ICD-10-CM | POA: Diagnosis not present

## 2021-08-14 DIAGNOSIS — M1990 Unspecified osteoarthritis, unspecified site: Secondary | ICD-10-CM | POA: Diagnosis not present

## 2021-08-14 DIAGNOSIS — R2681 Unsteadiness on feet: Secondary | ICD-10-CM | POA: Diagnosis not present

## 2021-08-14 DIAGNOSIS — R269 Unspecified abnormalities of gait and mobility: Secondary | ICD-10-CM | POA: Diagnosis not present

## 2021-08-14 DIAGNOSIS — R1312 Dysphagia, oropharyngeal phase: Secondary | ICD-10-CM | POA: Diagnosis not present

## 2021-08-15 ENCOUNTER — Emergency Department (HOSPITAL_COMMUNITY): Payer: Medicare Other

## 2021-08-15 ENCOUNTER — Encounter (HOSPITAL_COMMUNITY): Payer: Self-pay

## 2021-08-15 ENCOUNTER — Other Ambulatory Visit: Payer: Self-pay

## 2021-08-15 ENCOUNTER — Emergency Department (HOSPITAL_COMMUNITY)
Admission: EM | Admit: 2021-08-15 | Discharge: 2021-08-15 | Disposition: A | Discharge status: Home / Self Care | Payer: Medicare Other | Attending: Emergency Medicine | Admitting: Emergency Medicine

## 2021-08-15 DIAGNOSIS — U071 COVID-19: Secondary | ICD-10-CM | POA: Insufficient documentation

## 2021-08-15 DIAGNOSIS — R1110 Vomiting, unspecified: Secondary | ICD-10-CM | POA: Insufficient documentation

## 2021-08-15 DIAGNOSIS — R112 Nausea with vomiting, unspecified: Secondary | ICD-10-CM

## 2021-08-15 DIAGNOSIS — R059 Cough, unspecified: Secondary | ICD-10-CM | POA: Diagnosis not present

## 2021-08-15 DIAGNOSIS — R509 Fever, unspecified: Secondary | ICD-10-CM

## 2021-08-15 DIAGNOSIS — R1111 Vomiting without nausea: Secondary | ICD-10-CM | POA: Diagnosis not present

## 2021-08-15 DIAGNOSIS — Z7901 Long term (current) use of anticoagulants: Secondary | ICD-10-CM | POA: Diagnosis not present

## 2021-08-15 LAB — URINALYSIS, ROUTINE W REFLEX MICROSCOPIC
Bacteria, UA: NONE SEEN
Bilirubin Urine: NEGATIVE
Glucose, UA: NEGATIVE mg/dL
Hgb urine dipstick: NEGATIVE
Ketones, ur: 20 mg/dL — AB
Leukocytes,Ua: NEGATIVE
Nitrite: NEGATIVE
Protein, ur: 30 mg/dL — AB
Specific Gravity, Urine: 1.026 (ref 1.005–1.030)
pH: 5 (ref 5.0–8.0)

## 2021-08-15 LAB — COMPREHENSIVE METABOLIC PANEL
ALT: 25 U/L (ref 0–44)
AST: 23 U/L (ref 15–41)
Albumin: 3.9 g/dL (ref 3.5–5.0)
Alkaline Phosphatase: 77 U/L (ref 38–126)
Anion gap: 9 (ref 5–15)
BUN: 14 mg/dL (ref 8–23)
CO2: 25 mmol/L (ref 22–32)
Calcium: 9.1 mg/dL (ref 8.9–10.3)
Chloride: 102 mmol/L (ref 98–111)
Creatinine, Ser: 0.91 mg/dL (ref 0.61–1.24)
GFR, Estimated: >60 mL/min (ref >60)
Glucose, Bld: 171 mg/dL — ABNORMAL HIGH (ref 70–99)
Potassium: 4 mmol/L (ref 3.5–5.1)
Sodium: 136 mmol/L (ref 135–145)
Total Bilirubin: 1.1 mg/dL (ref 0.3–1.2)
Total Protein: 6.6 g/dL (ref 6.5–8.1)

## 2021-08-15 LAB — CBC
HCT: 42 % (ref 39.0–52.0)
Hemoglobin: 13.8 g/dL (ref 13.0–17.0)
MCH: 31.7 pg (ref 26.0–34.0)
MCHC: 32.9 g/dL (ref 30.0–36.0)
MCV: 96.6 fL (ref 80.0–100.0)
Platelets: 148 10*3/uL — ABNORMAL LOW (ref 150–400)
RBC: 4.35 MIL/uL (ref 4.22–5.81)
RDW: 14.4 % (ref 11.5–15.5)
WBC: 11.3 10*3/uL — ABNORMAL HIGH (ref 4.0–10.5)
nRBC: 0 % (ref 0.0–0.2)

## 2021-08-15 LAB — TROPONIN I (HIGH SENSITIVITY): Troponin I (High Sensitivity): 10 ng/L (ref <18)

## 2021-08-15 MED ORDER — MOLNUPIRAVIR EUA 200MG CAPSULE: 4.0000 | ORAL_CAPSULE | Freq: Two times a day (BID) | ORAL | 0 refills | Status: AC

## 2021-08-15 MED ORDER — MOLNUPIRAVIR EUA 200MG CAPSULE: 4.0000 | ORAL_CAPSULE | Freq: Two times a day (BID) | ORAL | 0 refills | Status: DC

## 2021-08-15 MED ORDER — ACETAMINOPHEN 500 MG PO TABS
1000.0000 mg | ORAL_TABLET | Freq: Once | ORAL | Status: AC
  Administered 2021-08-15: 1000 mg via ORAL
  Filled 2021-08-15: qty 2

## 2021-08-15 NOTE — Discharge Instructions (Addendum)
It was our pleasure to provide your ER care today - we hope that you feel better.  Drink plenty of fluids/stay well hydrated.   Take molnupiravir as prescribed.   Your chest xray was read as:Interval increase in cardiac silhouette with some component likely due to AP portable technique. Underlying pericardial effusion is not fully excluded.  Follow up with primary care doctor in the next 1-2 weeks, discuss, and have follow xray.   Return to ER if worse, new symptoms, increased trouble breathing, persistent vomiting, new or severe pain, severe abdominal pain, chest pain, or other concern.

## 2021-08-15 NOTE — ED Notes (Signed)
Male purewick placed on pt 

## 2021-08-15 NOTE — ED Notes (Signed)
Son called for transportation home.

## 2021-08-15 NOTE — ED Notes (Signed)
[facility] cancelled.

## 2021-08-15 NOTE — ED Triage Notes (Signed)
Patient [facility] with c/o Covid positive since this morning. Pt c/o nausea and vomiting this morning.

## 2021-08-15 NOTE — ED Notes (Signed)
[facility] called and aware of covid status.

## 2021-08-15 NOTE — ED Provider Notes (Signed)
[facility] DEPT Provider Note   CSN: [id number] Arrival date & time: 08/15/21  1451     History  Chief Complaint  Patient presents with   Fever   Nasal Congestion    Paul Frith. is a 88 y.o. male.  Patient from SNF where dx with covid today. Pt indicates nasal congestion/rhinorrhea, occasional non prod cough, fever. Symptoms acute onset in past 1-2 days.  Had episode emesis earlier today - indicates not bloody, not bilious. No abd pain or distension. Having normal bms. Notes nausea now resolved. Denies chest pain or discomfort. No sob. No headache. No dysuria or gu c/o. Pt poor historian - level 5 caveat.   The history is provided by the patient, medical records and the EMS personnel. The history is limited by the condition of the patient.      Home Medications Prior to Admission medications   Medication Sig Start Date End Date Taking? Authorizing Provider  atorvastatin (LIPITOR) 40 MG tablet Take 1 tablet daily for Cholesterol Patient taking differently: Take 40 mg by mouth daily. for Cholesterol 10/01/19   Liane Comber, NP  Azelastine HCl 137 MCG/SPRAY SOLN Place into both nostrils. 12/05/20   [provider]  buPROPion (WELLBUTRIN SR) 200 MG 12 hr tablet Take 200 mg by mouth every morning. 12/03/20   [provider]  buPROPion (WELLBUTRIN XL) 300 MG 24 hr tablet Take 1 tablet daily for Mood Patient taking differently: Take 300 mg by mouth daily. for Mood 12/29/19   Unk Pinto, MD  busPIRone (BUSPAR) 5 MG tablet Take 5 mg by mouth 2 (two) times daily. 11/17/20   [provider]  Cholecalciferol (VITAMIN D) 125 MCG (5000 UT) CAPS Take 5,000 Units by mouth daily.    [provider]  citalopram (CELEXA) 40 MG tablet Take 1 tablet (40 mg total) by mouth daily. 10/08/19 10/07/20  Vicie Mutters R, PA-C  ELIQUIS 5 MG TABS tablet TAKE 1 TABLET BY MOUTH  TWICE DAILY Patient taking differently: Take 5 mg by  mouth 2 (two) times daily. 04/07/20   Croitoru, Mihai, MD  gabapentin (NEURONTIN) 300 MG capsule TAKE 1 CAPSULE BY MOUTH 3  TIMES DAILY AS NEEDED FOR  CHRONIC PAIN Patient taking differently: Take 300 mg by mouth 3 (three) times daily as needed (chronic pain). 03/17/20   Liane Comber, NP  losartan (COZAAR) 25 MG tablet Take 0.5 tablets (12.5 mg total) by mouth daily. 10/05/19 03/25/20  Croitoru, Mihai, MD  methocarbamol (ROBAXIN) 500 MG tablet Take by mouth daily as needed. 11/22/20   [provider]  nitroGLYCERIN (NITROSTAT) 0.4 MG SL tablet Place 1 tablet (0.4 mg total) under the tongue every 5 (five) minutes as needed for chest pain. 12/25/19   Unk Pinto, MD  omeprazole (PRILOSEC OTC) 20 MG tablet Take 20 mg by mouth daily as needed (acid reflux).    [provider]  OVER THE COUNTER MEDICATION Take 2 capsules by mouth daily. Omega Q with resveratrol and turmeric    [provider]  potassium chloride SA (KLOR-CON) 20 MEQ tablet Take 40 mEq by mouth daily.    [provider]  propranolol ER (INDERAL LA) 120 MG 24 hr capsule Take 120 mg by mouth daily. 11/17/20   [provider]      Allergies    Erythromycin and Fluorouracil    Review of Systems   Review of Systems  Constitutional:  Positive for fever. Negative for chills.  HENT:  Positive for  congestion and rhinorrhea. Negative for sore throat and trouble swallowing.   Eyes:  Negative for redness.  Respiratory:  Positive for cough. Negative for shortness of breath.   Cardiovascular:  Negative for chest pain.  Gastrointestinal:  Positive for nausea and vomiting. Negative for abdominal pain, constipation and diarrhea.  Genitourinary:  Negative for dysuria and flank pain.  Musculoskeletal:  Negative for back pain and neck pain.  Skin:  Negative for rash.  Neurological:  Negative for headaches.  Hematological:  Does not bruise/bleed easily.  Psychiatric/Behavioral:  Negative for agitation.     Physical Exam Updated Vital Signs BP (!) 112/54    Pulse 60    Temp (!) 101 F (38.3 C) (Oral)    Resp (!) 25    SpO2 92%  Physical Exam Vitals and nursing note reviewed.  Constitutional:      Appearance: Normal appearance. He is well-developed.  HENT:     Head: Atraumatic.     Nose: Nose normal.     Mouth/Throat:     Mouth: Mucous membranes are moist.     Pharynx: Oropharynx is clear.  Eyes:     General: No scleral icterus.    Conjunctiva/sclera: Conjunctivae normal.     Pupils: Pupils are equal, round, and reactive to light.  Neck:     Trachea: No tracheal deviation.     Comments: No stiffness or rigidity.  Cardiovascular:     Rate and Rhythm: Normal rate and regular rhythm.     Pulses: Normal pulses.     Heart sounds: Normal heart sounds. No murmur heard.   No friction rub. No gallop.  Pulmonary:     Effort: Pulmonary effort is normal. No accessory muscle usage or respiratory distress.     Breath sounds: Normal breath sounds.  Abdominal:     General: Bowel sounds are normal. There is no distension.     Palpations: Abdomen is soft. There is no mass.     Tenderness: There is no abdominal tenderness. There is no guarding or rebound.     Hernia: No hernia is present.  Genitourinary:    Comments: No cva tenderness. Musculoskeletal:        General: No swelling.     Cervical back: Normal range of motion and neck supple. No rigidity.  Skin:    General: Skin is warm and dry.     Findings: No rash.  Neurological:     Mental Status: He is alert.     Comments: Alert, speech clear. Motor/sens grossly intact bil.   Psychiatric:        Mood and Affect: Mood normal.    ED Results / Procedures / Treatments   Labs (all labs ordered are listed, but only abnormal results are displayed) Results for orders placed or performed during the hospital encounter of 08/15/21  CBC  Result Value Ref Range   WBC 11.3 (H) 4.0 - 10.5 K/uL   RBC 4.35 4.22 - 5.81 MIL/uL   Hemoglobin 13.8  13.0 - 17.0 g/dL   HCT 42.0 39.0 - 52.0 %   MCV 96.6 80.0 - 100.0 fL   MCH 31.7 26.0 - 34.0 pg   MCHC 32.9 30.0 - 36.0 g/dL   RDW 14.4 11.5 - 15.5 %   Platelets 148 (L) 150 - 400 K/uL   nRBC 0.0 0.0 - 0.2 %  Comprehensive metabolic panel  Result Value Ref Range   Sodium 136 135 - 145 mmol/L   Potassium 4.0 3.5 - 5.1 mmol/L  Chloride 102 98 - 111 mmol/L   CO2 25 22 - 32 mmol/L   Glucose, Bld 171 (H) 70 - 99 mg/dL   BUN 14 8 - 23 mg/dL   Creatinine, Ser 0.91 0.61 - 1.24 mg/dL   Calcium 9.1 8.9 - 10.3 mg/dL   Total Protein 6.6 6.5 - 8.1 g/dL   Albumin 3.9 3.5 - 5.0 g/dL   AST 23 15 - 41 U/L   ALT 25 0 - 44 U/L   Alkaline Phosphatase 77 38 - 126 U/L   Total Bilirubin 1.1 0.3 - 1.2 mg/dL   GFR, Estimated >60 >60 mL/min   Anion gap 9 5 - 15  Urinalysis, Routine w reflex microscopic Urine, Clean Catch  Result Value Ref Range   Color, Urine YELLOW YELLOW   APPearance CLEAR CLEAR   Specific Gravity, Urine 1.026 1.005 - 1.030   pH 5.0 5.0 - 8.0   Glucose, UA NEGATIVE NEGATIVE mg/dL   Hgb urine dipstick NEGATIVE NEGATIVE   Bilirubin Urine NEGATIVE NEGATIVE   Ketones, ur 20 (A) NEGATIVE mg/dL   Protein, ur 30 (A) NEGATIVE mg/dL   Nitrite NEGATIVE NEGATIVE   Leukocytes,Ua NEGATIVE NEGATIVE   RBC / HPF 0-5 0 - 5 RBC/hpf   WBC, UA 0-5 0 - 5 WBC/hpf   Bacteria, UA NONE SEEN NONE SEEN   Mucus PRESENT   Troponin I (High Sensitivity)  Result Value Ref Range   Troponin I (High Sensitivity) 10 <18 ng/L     EKG EKG Interpretation  Date/Time:  Saturday August 15 2021 15:25:06 EST Ventricular Rate:  62 PR Interval:    QRS Duration: 120 QT Interval:  446 QTC Calculation: 453 R Axis:   -6 Text Interpretation: Sinus rhythm Ventricular premature complex Nonspecific intraventricular conduction delay Non-specific ST-t changes No significant change since last tracing Confirmed by Lajean Saver [phone]) on 08/15/2021 4:03:39 PM  Radiology DG Chest Port 1 View  Result Date:  08/15/2021 CLINICAL DATA:  cough, fever EXAM: PORTABLE CHEST 1 VIEW COMPARISON:  Chest x-ray 03/06/2018, chest x-ray 08/23/2020 FINDINGS: Left chest wall dual lead pacemaker is again noted. Interval increase in cardiac silhouette with some component likely due to AP portable technique. Otherwise the heart and mediastinal contours are unchanged. Surgical changes noted overlying the mediastinum. Coronary artery stent noted. No focal consolidation. No pulmonary edema. No pleural effusion. No pneumothorax. No acute osseous abnormality. IMPRESSION: Interval increase in cardiac silhouette with some component likely due to AP portable technique. Underlying pericardial effusion is not fully excluded. Electronically Signed   By: Iven Finn M.D.   On: 08/15/2021 16:09    Procedures Procedures    Medications Ordered in ED Medications  acetaminophen (TYLENOL) tablet 1,000 mg (1,000 mg Oral Given 08/15/21 1531)    ED Course/ Medical Decision Making/ A&P                           Medical Decision Making Problems Addressed: Acute febrile illness: acute illness or injury with systemic symptoms COVID-19 virus infection: acute illness or injury with systemic symptoms that poses a threat to life or bodily functions Nausea and vomiting in adult: acute illness or injury  Amount and/or Complexity of Data Reviewed Independent Historian: EMS    Details: hx provided External Data Reviewed: labs and notes. Labs: ordered. Decision-making details documented in ED Course. Radiology: ordered. ECG/medicine tests: ordered and independent interpretation performed. Decision-making details documented in ED Course.  Risk OTC drugs.   Iv  ns. Continuous pulse ox and cardiac monitoring. Stat labs. Imaging.   Cardiac monitor - sinus rhythm, rate 68.  Reviewed nursing notes and prior charts for additional history. External reports reviewed. Additional hx from EMS.   Labs reviewed/interpreted by me - hgb normal,  trop normal.   CXR reviewed/interpreted by me - no pna.   Acetaminophen po. Po fluids, food.   No recurrent nausea/vomiting.   Pt breathing comfortably on recheck, pulse ox 97% room air.   Recheck pt, no pain, no cp, no abd pain. No recurrent nv. Abd soft nt.   Pt currently appears stable for d/c.   Rx provided.   Return precautions provided.           Final Clinical Impression(s) / ED Diagnoses Final diagnoses:  None    Rx / DC Orders ED Discharge Orders     None         Lajean Saver, MD 08/16/21 1502

## 2021-08-15 NOTE — ED Notes (Signed)
Fluids offer to the patient.

## 2021-08-16 DIAGNOSIS — M1990 Unspecified osteoarthritis, unspecified site: Secondary | ICD-10-CM | POA: Diagnosis not present

## 2021-08-16 DIAGNOSIS — R278 Other lack of coordination: Secondary | ICD-10-CM | POA: Diagnosis not present

## 2021-08-16 DIAGNOSIS — R498 Other voice and resonance disorders: Secondary | ICD-10-CM | POA: Diagnosis not present

## 2021-08-16 DIAGNOSIS — R2689 Other abnormalities of gait and mobility: Secondary | ICD-10-CM | POA: Diagnosis not present

## 2021-08-16 DIAGNOSIS — R269 Unspecified abnormalities of gait and mobility: Secondary | ICD-10-CM | POA: Diagnosis not present

## 2021-08-16 DIAGNOSIS — R262 Difficulty in walking, not elsewhere classified: Secondary | ICD-10-CM | POA: Diagnosis not present

## 2021-08-16 DIAGNOSIS — I5042 Chronic combined systolic (congestive) and diastolic (congestive) heart failure: Secondary | ICD-10-CM | POA: Diagnosis not present

## 2021-08-16 DIAGNOSIS — M6281 Muscle weakness (generalized): Secondary | ICD-10-CM | POA: Diagnosis not present

## 2021-08-16 DIAGNOSIS — R41841 Cognitive communication deficit: Secondary | ICD-10-CM | POA: Diagnosis not present

## 2021-08-16 DIAGNOSIS — Z9181 History of falling: Secondary | ICD-10-CM | POA: Diagnosis not present

## 2021-08-16 DIAGNOSIS — R1312 Dysphagia, oropharyngeal phase: Secondary | ICD-10-CM | POA: Diagnosis not present

## 2021-08-16 DIAGNOSIS — M6258 Muscle wasting and atrophy, not elsewhere classified, other site: Secondary | ICD-10-CM | POA: Diagnosis not present

## 2021-08-16 DIAGNOSIS — M25561 Pain in right knee: Secondary | ICD-10-CM | POA: Diagnosis not present

## 2021-08-16 DIAGNOSIS — R2681 Unsteadiness on feet: Secondary | ICD-10-CM | POA: Diagnosis not present

## 2021-08-16 DIAGNOSIS — Z8673 Personal history of transient ischemic attack (TIA), and cerebral infarction without residual deficits: Secondary | ICD-10-CM | POA: Diagnosis not present

## 2021-08-17 DIAGNOSIS — Z8673 Personal history of transient ischemic attack (TIA), and cerebral infarction without residual deficits: Secondary | ICD-10-CM | POA: Diagnosis not present

## 2021-08-17 DIAGNOSIS — Z9181 History of falling: Secondary | ICD-10-CM | POA: Diagnosis not present

## 2021-08-17 DIAGNOSIS — R262 Difficulty in walking, not elsewhere classified: Secondary | ICD-10-CM | POA: Diagnosis not present

## 2021-08-17 DIAGNOSIS — R2689 Other abnormalities of gait and mobility: Secondary | ICD-10-CM | POA: Diagnosis not present

## 2021-08-17 DIAGNOSIS — M1990 Unspecified osteoarthritis, unspecified site: Secondary | ICD-10-CM | POA: Diagnosis not present

## 2021-08-17 DIAGNOSIS — R278 Other lack of coordination: Secondary | ICD-10-CM | POA: Diagnosis not present

## 2021-08-17 DIAGNOSIS — M6281 Muscle weakness (generalized): Secondary | ICD-10-CM | POA: Diagnosis not present

## 2021-08-17 DIAGNOSIS — M6258 Muscle wasting and atrophy, not elsewhere classified, other site: Secondary | ICD-10-CM | POA: Diagnosis not present

## 2021-08-17 DIAGNOSIS — R41841 Cognitive communication deficit: Secondary | ICD-10-CM | POA: Diagnosis not present

## 2021-08-17 DIAGNOSIS — R1312 Dysphagia, oropharyngeal phase: Secondary | ICD-10-CM | POA: Diagnosis not present

## 2021-08-17 DIAGNOSIS — M25561 Pain in right knee: Secondary | ICD-10-CM | POA: Diagnosis not present

## 2021-08-17 DIAGNOSIS — R269 Unspecified abnormalities of gait and mobility: Secondary | ICD-10-CM | POA: Diagnosis not present

## 2021-08-17 DIAGNOSIS — I5042 Chronic combined systolic (congestive) and diastolic (congestive) heart failure: Secondary | ICD-10-CM | POA: Diagnosis not present

## 2021-08-17 DIAGNOSIS — R498 Other voice and resonance disorders: Secondary | ICD-10-CM | POA: Diagnosis not present

## 2021-08-17 DIAGNOSIS — R2681 Unsteadiness on feet: Secondary | ICD-10-CM | POA: Diagnosis not present

## 2021-08-18 DIAGNOSIS — R1312 Dysphagia, oropharyngeal phase: Secondary | ICD-10-CM | POA: Diagnosis not present

## 2021-08-18 DIAGNOSIS — Z9181 History of falling: Secondary | ICD-10-CM | POA: Diagnosis not present

## 2021-08-18 DIAGNOSIS — M1990 Unspecified osteoarthritis, unspecified site: Secondary | ICD-10-CM | POA: Diagnosis not present

## 2021-08-18 DIAGNOSIS — R269 Unspecified abnormalities of gait and mobility: Secondary | ICD-10-CM | POA: Diagnosis not present

## 2021-08-18 DIAGNOSIS — R0981 Nasal congestion: Secondary | ICD-10-CM | POA: Diagnosis not present

## 2021-08-18 DIAGNOSIS — Z8673 Personal history of transient ischemic attack (TIA), and cerebral infarction without residual deficits: Secondary | ICD-10-CM | POA: Diagnosis not present

## 2021-08-18 DIAGNOSIS — R498 Other voice and resonance disorders: Secondary | ICD-10-CM | POA: Diagnosis not present

## 2021-08-18 DIAGNOSIS — U071 COVID-19: Secondary | ICD-10-CM | POA: Diagnosis not present

## 2021-08-18 DIAGNOSIS — M25561 Pain in right knee: Secondary | ICD-10-CM | POA: Diagnosis not present

## 2021-08-18 DIAGNOSIS — R41841 Cognitive communication deficit: Secondary | ICD-10-CM | POA: Diagnosis not present

## 2021-08-18 DIAGNOSIS — R2681 Unsteadiness on feet: Secondary | ICD-10-CM | POA: Diagnosis not present

## 2021-08-18 DIAGNOSIS — M6258 Muscle wasting and atrophy, not elsewhere classified, other site: Secondary | ICD-10-CM | POA: Diagnosis not present

## 2021-08-18 DIAGNOSIS — I5040 Unspecified combined systolic (congestive) and diastolic (congestive) heart failure: Secondary | ICD-10-CM | POA: Diagnosis not present

## 2021-08-18 DIAGNOSIS — R2689 Other abnormalities of gait and mobility: Secondary | ICD-10-CM | POA: Diagnosis not present

## 2021-08-18 DIAGNOSIS — M6281 Muscle weakness (generalized): Secondary | ICD-10-CM | POA: Diagnosis not present

## 2021-08-18 DIAGNOSIS — I5042 Chronic combined systolic (congestive) and diastolic (congestive) heart failure: Secondary | ICD-10-CM | POA: Diagnosis not present

## 2021-08-18 DIAGNOSIS — R262 Difficulty in walking, not elsewhere classified: Secondary | ICD-10-CM | POA: Diagnosis not present

## 2021-08-18 DIAGNOSIS — R278 Other lack of coordination: Secondary | ICD-10-CM | POA: Diagnosis not present

## 2021-08-18 DIAGNOSIS — F418 Other specified anxiety disorders: Secondary | ICD-10-CM | POA: Diagnosis not present

## 2021-08-19 DIAGNOSIS — M1990 Unspecified osteoarthritis, unspecified site: Secondary | ICD-10-CM | POA: Diagnosis not present

## 2021-08-19 DIAGNOSIS — R262 Difficulty in walking, not elsewhere classified: Secondary | ICD-10-CM | POA: Diagnosis not present

## 2021-08-19 DIAGNOSIS — I5042 Chronic combined systolic (congestive) and diastolic (congestive) heart failure: Secondary | ICD-10-CM | POA: Diagnosis not present

## 2021-08-19 DIAGNOSIS — Z9181 History of falling: Secondary | ICD-10-CM | POA: Diagnosis not present

## 2021-08-19 DIAGNOSIS — R2681 Unsteadiness on feet: Secondary | ICD-10-CM | POA: Diagnosis not present

## 2021-08-19 DIAGNOSIS — M6281 Muscle weakness (generalized): Secondary | ICD-10-CM | POA: Diagnosis not present

## 2021-08-20 DIAGNOSIS — S50312D Abrasion of left elbow, subsequent encounter: Secondary | ICD-10-CM | POA: Diagnosis not present

## 2021-08-20 DIAGNOSIS — R262 Difficulty in walking, not elsewhere classified: Secondary | ICD-10-CM | POA: Diagnosis not present

## 2021-08-20 DIAGNOSIS — M6281 Muscle weakness (generalized): Secondary | ICD-10-CM | POA: Diagnosis not present

## 2021-08-20 DIAGNOSIS — I5042 Chronic combined systolic (congestive) and diastolic (congestive) heart failure: Secondary | ICD-10-CM | POA: Diagnosis not present

## 2021-08-20 DIAGNOSIS — Z9181 History of falling: Secondary | ICD-10-CM | POA: Diagnosis not present

## 2021-08-20 DIAGNOSIS — M1990 Unspecified osteoarthritis, unspecified site: Secondary | ICD-10-CM | POA: Diagnosis not present

## 2021-08-20 DIAGNOSIS — R2681 Unsteadiness on feet: Secondary | ICD-10-CM | POA: Diagnosis not present

## 2021-08-21 DIAGNOSIS — U071 COVID-19: Secondary | ICD-10-CM | POA: Diagnosis not present

## 2021-08-21 DIAGNOSIS — R262 Difficulty in walking, not elsewhere classified: Secondary | ICD-10-CM | POA: Diagnosis not present

## 2021-08-21 DIAGNOSIS — D692 Other nonthrombocytopenic purpura: Secondary | ICD-10-CM | POA: Diagnosis not present

## 2021-08-21 DIAGNOSIS — Z9181 History of falling: Secondary | ICD-10-CM | POA: Diagnosis not present

## 2021-08-21 DIAGNOSIS — M1990 Unspecified osteoarthritis, unspecified site: Secondary | ICD-10-CM | POA: Diagnosis not present

## 2021-08-21 DIAGNOSIS — R2681 Unsteadiness on feet: Secondary | ICD-10-CM | POA: Diagnosis not present

## 2021-08-21 DIAGNOSIS — M6281 Muscle weakness (generalized): Secondary | ICD-10-CM | POA: Diagnosis not present

## 2021-08-21 DIAGNOSIS — I5042 Chronic combined systolic (congestive) and diastolic (congestive) heart failure: Secondary | ICD-10-CM | POA: Diagnosis not present

## 2021-08-24 DIAGNOSIS — M1990 Unspecified osteoarthritis, unspecified site: Secondary | ICD-10-CM | POA: Diagnosis not present

## 2021-08-24 DIAGNOSIS — R262 Difficulty in walking, not elsewhere classified: Secondary | ICD-10-CM | POA: Diagnosis not present

## 2021-08-24 DIAGNOSIS — R2681 Unsteadiness on feet: Secondary | ICD-10-CM | POA: Diagnosis not present

## 2021-08-24 DIAGNOSIS — I5042 Chronic combined systolic (congestive) and diastolic (congestive) heart failure: Secondary | ICD-10-CM | POA: Diagnosis not present

## 2021-08-24 DIAGNOSIS — M6281 Muscle weakness (generalized): Secondary | ICD-10-CM | POA: Diagnosis not present

## 2021-08-24 DIAGNOSIS — Z9181 History of falling: Secondary | ICD-10-CM | POA: Diagnosis not present

## 2021-08-25 DIAGNOSIS — Z9181 History of falling: Secondary | ICD-10-CM | POA: Diagnosis not present

## 2021-08-25 DIAGNOSIS — R2681 Unsteadiness on feet: Secondary | ICD-10-CM | POA: Diagnosis not present

## 2021-08-25 DIAGNOSIS — M6281 Muscle weakness (generalized): Secondary | ICD-10-CM | POA: Diagnosis not present

## 2021-08-25 DIAGNOSIS — I5042 Chronic combined systolic (congestive) and diastolic (congestive) heart failure: Secondary | ICD-10-CM | POA: Diagnosis not present

## 2021-08-25 DIAGNOSIS — M1990 Unspecified osteoarthritis, unspecified site: Secondary | ICD-10-CM | POA: Diagnosis not present

## 2021-08-25 DIAGNOSIS — R262 Difficulty in walking, not elsewhere classified: Secondary | ICD-10-CM | POA: Diagnosis not present

## 2021-08-26 DIAGNOSIS — I5042 Chronic combined systolic (congestive) and diastolic (congestive) heart failure: Secondary | ICD-10-CM | POA: Diagnosis not present

## 2021-08-26 DIAGNOSIS — R262 Difficulty in walking, not elsewhere classified: Secondary | ICD-10-CM | POA: Diagnosis not present

## 2021-08-26 DIAGNOSIS — M6281 Muscle weakness (generalized): Secondary | ICD-10-CM | POA: Diagnosis not present

## 2021-08-26 DIAGNOSIS — M1990 Unspecified osteoarthritis, unspecified site: Secondary | ICD-10-CM | POA: Diagnosis not present

## 2021-08-26 DIAGNOSIS — R2681 Unsteadiness on feet: Secondary | ICD-10-CM | POA: Diagnosis not present

## 2021-08-26 DIAGNOSIS — Z9181 History of falling: Secondary | ICD-10-CM | POA: Diagnosis not present

## 2021-08-27 DIAGNOSIS — M6281 Muscle weakness (generalized): Secondary | ICD-10-CM | POA: Diagnosis not present

## 2021-08-27 DIAGNOSIS — Z9181 History of falling: Secondary | ICD-10-CM | POA: Diagnosis not present

## 2021-08-27 DIAGNOSIS — R2681 Unsteadiness on feet: Secondary | ICD-10-CM | POA: Diagnosis not present

## 2021-08-27 DIAGNOSIS — M1990 Unspecified osteoarthritis, unspecified site: Secondary | ICD-10-CM | POA: Diagnosis not present

## 2021-08-27 DIAGNOSIS — R262 Difficulty in walking, not elsewhere classified: Secondary | ICD-10-CM | POA: Diagnosis not present

## 2021-08-27 DIAGNOSIS — I5042 Chronic combined systolic (congestive) and diastolic (congestive) heart failure: Secondary | ICD-10-CM | POA: Diagnosis not present

## 2021-08-28 DIAGNOSIS — M1990 Unspecified osteoarthritis, unspecified site: Secondary | ICD-10-CM | POA: Diagnosis not present

## 2021-08-28 DIAGNOSIS — I5042 Chronic combined systolic (congestive) and diastolic (congestive) heart failure: Secondary | ICD-10-CM | POA: Diagnosis not present

## 2021-08-28 DIAGNOSIS — R262 Difficulty in walking, not elsewhere classified: Secondary | ICD-10-CM | POA: Diagnosis not present

## 2021-08-28 DIAGNOSIS — Z9181 History of falling: Secondary | ICD-10-CM | POA: Diagnosis not present

## 2021-08-28 DIAGNOSIS — M6281 Muscle weakness (generalized): Secondary | ICD-10-CM | POA: Diagnosis not present

## 2021-08-28 DIAGNOSIS — R2681 Unsteadiness on feet: Secondary | ICD-10-CM | POA: Diagnosis not present

## 2021-08-31 DIAGNOSIS — Z9181 History of falling: Secondary | ICD-10-CM | POA: Diagnosis not present

## 2021-08-31 DIAGNOSIS — R2681 Unsteadiness on feet: Secondary | ICD-10-CM | POA: Diagnosis not present

## 2021-08-31 DIAGNOSIS — R262 Difficulty in walking, not elsewhere classified: Secondary | ICD-10-CM | POA: Diagnosis not present

## 2021-08-31 DIAGNOSIS — M1990 Unspecified osteoarthritis, unspecified site: Secondary | ICD-10-CM | POA: Diagnosis not present

## 2021-08-31 DIAGNOSIS — M6281 Muscle weakness (generalized): Secondary | ICD-10-CM | POA: Diagnosis not present

## 2021-08-31 DIAGNOSIS — I5042 Chronic combined systolic (congestive) and diastolic (congestive) heart failure: Secondary | ICD-10-CM | POA: Diagnosis not present

## 2021-09-01 DIAGNOSIS — I5042 Chronic combined systolic (congestive) and diastolic (congestive) heart failure: Secondary | ICD-10-CM | POA: Diagnosis not present

## 2021-09-01 DIAGNOSIS — M1990 Unspecified osteoarthritis, unspecified site: Secondary | ICD-10-CM | POA: Diagnosis not present

## 2021-09-01 DIAGNOSIS — R2681 Unsteadiness on feet: Secondary | ICD-10-CM | POA: Diagnosis not present

## 2021-09-01 DIAGNOSIS — Z9181 History of falling: Secondary | ICD-10-CM | POA: Diagnosis not present

## 2021-09-01 DIAGNOSIS — R262 Difficulty in walking, not elsewhere classified: Secondary | ICD-10-CM | POA: Diagnosis not present

## 2021-09-01 DIAGNOSIS — M6281 Muscle weakness (generalized): Secondary | ICD-10-CM | POA: Diagnosis not present

## 2021-09-02 DIAGNOSIS — I5042 Chronic combined systolic (congestive) and diastolic (congestive) heart failure: Secondary | ICD-10-CM | POA: Diagnosis not present

## 2021-09-02 DIAGNOSIS — M1990 Unspecified osteoarthritis, unspecified site: Secondary | ICD-10-CM | POA: Diagnosis not present

## 2021-09-02 DIAGNOSIS — R2681 Unsteadiness on feet: Secondary | ICD-10-CM | POA: Diagnosis not present

## 2021-09-02 DIAGNOSIS — R262 Difficulty in walking, not elsewhere classified: Secondary | ICD-10-CM | POA: Diagnosis not present

## 2021-09-02 DIAGNOSIS — M6281 Muscle weakness (generalized): Secondary | ICD-10-CM | POA: Diagnosis not present

## 2021-09-02 DIAGNOSIS — Z9181 History of falling: Secondary | ICD-10-CM | POA: Diagnosis not present

## 2021-09-03 DIAGNOSIS — M6281 Muscle weakness (generalized): Secondary | ICD-10-CM | POA: Diagnosis not present

## 2021-09-03 DIAGNOSIS — R262 Difficulty in walking, not elsewhere classified: Secondary | ICD-10-CM | POA: Diagnosis not present

## 2021-09-03 DIAGNOSIS — M1990 Unspecified osteoarthritis, unspecified site: Secondary | ICD-10-CM | POA: Diagnosis not present

## 2021-09-03 DIAGNOSIS — I5042 Chronic combined systolic (congestive) and diastolic (congestive) heart failure: Secondary | ICD-10-CM | POA: Diagnosis not present

## 2021-09-03 DIAGNOSIS — Z9181 History of falling: Secondary | ICD-10-CM | POA: Diagnosis not present

## 2021-09-03 DIAGNOSIS — R2681 Unsteadiness on feet: Secondary | ICD-10-CM | POA: Diagnosis not present

## 2021-09-04 DIAGNOSIS — R2681 Unsteadiness on feet: Secondary | ICD-10-CM | POA: Diagnosis not present

## 2021-09-04 DIAGNOSIS — M1990 Unspecified osteoarthritis, unspecified site: Secondary | ICD-10-CM | POA: Diagnosis not present

## 2021-09-04 DIAGNOSIS — M6281 Muscle weakness (generalized): Secondary | ICD-10-CM | POA: Diagnosis not present

## 2021-09-04 DIAGNOSIS — Z9181 History of falling: Secondary | ICD-10-CM | POA: Diagnosis not present

## 2021-09-04 DIAGNOSIS — R262 Difficulty in walking, not elsewhere classified: Secondary | ICD-10-CM | POA: Diagnosis not present

## 2021-09-04 DIAGNOSIS — I5042 Chronic combined systolic (congestive) and diastolic (congestive) heart failure: Secondary | ICD-10-CM | POA: Diagnosis not present

## 2021-09-08 DIAGNOSIS — I5042 Chronic combined systolic (congestive) and diastolic (congestive) heart failure: Secondary | ICD-10-CM | POA: Diagnosis not present

## 2021-09-08 DIAGNOSIS — R262 Difficulty in walking, not elsewhere classified: Secondary | ICD-10-CM | POA: Diagnosis not present

## 2021-09-08 DIAGNOSIS — R2681 Unsteadiness on feet: Secondary | ICD-10-CM | POA: Diagnosis not present

## 2021-09-08 DIAGNOSIS — M1990 Unspecified osteoarthritis, unspecified site: Secondary | ICD-10-CM | POA: Diagnosis not present

## 2021-09-08 DIAGNOSIS — Z9181 History of falling: Secondary | ICD-10-CM | POA: Diagnosis not present

## 2021-09-08 DIAGNOSIS — M6281 Muscle weakness (generalized): Secondary | ICD-10-CM | POA: Diagnosis not present

## 2021-09-09 DIAGNOSIS — M1990 Unspecified osteoarthritis, unspecified site: Secondary | ICD-10-CM | POA: Diagnosis not present

## 2021-09-09 DIAGNOSIS — R2681 Unsteadiness on feet: Secondary | ICD-10-CM | POA: Diagnosis not present

## 2021-09-09 DIAGNOSIS — I5042 Chronic combined systolic (congestive) and diastolic (congestive) heart failure: Secondary | ICD-10-CM | POA: Diagnosis not present

## 2021-09-09 DIAGNOSIS — R262 Difficulty in walking, not elsewhere classified: Secondary | ICD-10-CM | POA: Diagnosis not present

## 2021-09-09 DIAGNOSIS — M6281 Muscle weakness (generalized): Secondary | ICD-10-CM | POA: Diagnosis not present

## 2021-09-09 DIAGNOSIS — Z9181 History of falling: Secondary | ICD-10-CM | POA: Diagnosis not present

## 2021-09-10 DIAGNOSIS — I5042 Chronic combined systolic (congestive) and diastolic (congestive) heart failure: Secondary | ICD-10-CM | POA: Diagnosis not present

## 2021-09-10 DIAGNOSIS — Z9181 History of falling: Secondary | ICD-10-CM | POA: Diagnosis not present

## 2021-09-10 DIAGNOSIS — R262 Difficulty in walking, not elsewhere classified: Secondary | ICD-10-CM | POA: Diagnosis not present

## 2021-09-10 DIAGNOSIS — R2681 Unsteadiness on feet: Secondary | ICD-10-CM | POA: Diagnosis not present

## 2021-09-10 DIAGNOSIS — M6281 Muscle weakness (generalized): Secondary | ICD-10-CM | POA: Diagnosis not present

## 2021-09-10 DIAGNOSIS — M1990 Unspecified osteoarthritis, unspecified site: Secondary | ICD-10-CM | POA: Diagnosis not present

## 2021-09-11 DIAGNOSIS — M1990 Unspecified osteoarthritis, unspecified site: Secondary | ICD-10-CM | POA: Diagnosis not present

## 2021-09-11 DIAGNOSIS — F331 Major depressive disorder, recurrent, moderate: Secondary | ICD-10-CM | POA: Diagnosis not present

## 2021-09-11 DIAGNOSIS — R2681 Unsteadiness on feet: Secondary | ICD-10-CM | POA: Diagnosis not present

## 2021-09-11 DIAGNOSIS — Z9181 History of falling: Secondary | ICD-10-CM | POA: Diagnosis not present

## 2021-09-11 DIAGNOSIS — M6281 Muscle weakness (generalized): Secondary | ICD-10-CM | POA: Diagnosis not present

## 2021-09-11 DIAGNOSIS — I5042 Chronic combined systolic (congestive) and diastolic (congestive) heart failure: Secondary | ICD-10-CM | POA: Diagnosis not present

## 2021-09-11 DIAGNOSIS — R262 Difficulty in walking, not elsewhere classified: Secondary | ICD-10-CM | POA: Diagnosis not present

## 2021-09-17 DIAGNOSIS — I10 Essential (primary) hypertension: Secondary | ICD-10-CM | POA: Diagnosis not present

## 2021-09-17 DIAGNOSIS — I4892 Unspecified atrial flutter: Secondary | ICD-10-CM | POA: Diagnosis not present

## 2021-09-17 DIAGNOSIS — I480 Paroxysmal atrial fibrillation: Secondary | ICD-10-CM | POA: Diagnosis not present

## 2021-09-17 DIAGNOSIS — I2510 Atherosclerotic heart disease of native coronary artery without angina pectoris: Secondary | ICD-10-CM | POA: Diagnosis not present

## 2021-09-22 DIAGNOSIS — Z8673 Personal history of transient ischemic attack (TIA), and cerebral infarction without residual deficits: Secondary | ICD-10-CM | POA: Diagnosis not present

## 2021-09-22 DIAGNOSIS — I10 Essential (primary) hypertension: Secondary | ICD-10-CM | POA: Diagnosis not present

## 2021-09-22 DIAGNOSIS — U071 COVID-19: Secondary | ICD-10-CM | POA: Diagnosis not present

## 2021-09-29 ENCOUNTER — Ambulatory Visit (INDEPENDENT_AMBULATORY_CARE_PROVIDER_SITE_OTHER): Payer: Medicare Other

## 2021-09-29 DIAGNOSIS — I442 Atrioventricular block, complete: Secondary | ICD-10-CM

## 2021-09-29 LAB — CUP PACEART REMOTE DEVICE CHECK
Battery Remaining Longevity: 46 mo
Battery Voltage: 2.95 V
Brady Statistic AP VP Percent: 95.89 %
Brady Statistic AP VS Percent: 0.39 %
Brady Statistic AS VP Percent: 1.54 %
Brady Statistic AS VS Percent: 2.18 %
Brady Statistic RA Percent Paced: 98.33 %
Brady Statistic RV Percent Paced: 97.43 %
Date Time Interrogation Session: 20230314053452
Implantable Lead Implant Date: 20190822
Implantable Lead Implant Date: 20190822
Implantable Lead Location: 753859
Implantable Lead Location: 753860
Implantable Lead Model: 3830
Implantable Lead Model: 5076
Implantable Pulse Generator Implant Date: 20190822
Lead Channel Impedance Value: 304 Ohm
Lead Channel Impedance Value: 304 Ohm
Lead Channel Impedance Value: 380 Ohm
Lead Channel Impedance Value: 437 Ohm
Lead Channel Pacing Threshold Amplitude: 0.75 V
Lead Channel Pacing Threshold Amplitude: 1.375 V
Lead Channel Pacing Threshold Pulse Width: 0.4 ms
Lead Channel Pacing Threshold Pulse Width: 0.4 ms
Lead Channel Sensing Intrinsic Amplitude: 10.25 mV
Lead Channel Sensing Intrinsic Amplitude: 10.25 mV
Lead Channel Sensing Intrinsic Amplitude: 5.625 mV
Lead Channel Sensing Intrinsic Amplitude: 5.625 mV
Lead Channel Setting Pacing Amplitude: 1.5 V
Lead Channel Setting Pacing Amplitude: 2.5 V
Lead Channel Setting Pacing Pulse Width: 1 ms
Lead Channel Setting Sensing Sensitivity: 2.8 mV

## 2021-10-07 DIAGNOSIS — R262 Difficulty in walking, not elsewhere classified: Secondary | ICD-10-CM | POA: Diagnosis not present

## 2021-10-07 DIAGNOSIS — M6281 Muscle weakness (generalized): Secondary | ICD-10-CM | POA: Diagnosis not present

## 2021-10-07 DIAGNOSIS — R2689 Other abnormalities of gait and mobility: Secondary | ICD-10-CM | POA: Diagnosis not present

## 2021-10-07 DIAGNOSIS — M6258 Muscle wasting and atrophy, not elsewhere classified, other site: Secondary | ICD-10-CM | POA: Diagnosis not present

## 2021-10-09 DIAGNOSIS — R635 Abnormal weight gain: Secondary | ICD-10-CM | POA: Diagnosis not present

## 2021-10-09 DIAGNOSIS — H04129 Dry eye syndrome of unspecified lacrimal gland: Secondary | ICD-10-CM | POA: Diagnosis not present

## 2021-10-09 DIAGNOSIS — L821 Other seborrheic keratosis: Secondary | ICD-10-CM | POA: Diagnosis not present

## 2021-10-09 DIAGNOSIS — Z0189 Encounter for other specified special examinations: Secondary | ICD-10-CM | POA: Diagnosis not present

## 2021-10-11 DIAGNOSIS — M6258 Muscle wasting and atrophy, not elsewhere classified, other site: Secondary | ICD-10-CM | POA: Diagnosis not present

## 2021-10-11 DIAGNOSIS — M6281 Muscle weakness (generalized): Secondary | ICD-10-CM | POA: Diagnosis not present

## 2021-10-11 DIAGNOSIS — R2689 Other abnormalities of gait and mobility: Secondary | ICD-10-CM | POA: Diagnosis not present

## 2021-10-11 DIAGNOSIS — R262 Difficulty in walking, not elsewhere classified: Secondary | ICD-10-CM | POA: Diagnosis not present

## 2021-10-12 DIAGNOSIS — R2689 Other abnormalities of gait and mobility: Secondary | ICD-10-CM | POA: Diagnosis not present

## 2021-10-12 DIAGNOSIS — M6258 Muscle wasting and atrophy, not elsewhere classified, other site: Secondary | ICD-10-CM | POA: Diagnosis not present

## 2021-10-12 DIAGNOSIS — R262 Difficulty in walking, not elsewhere classified: Secondary | ICD-10-CM | POA: Diagnosis not present

## 2021-10-12 DIAGNOSIS — M6281 Muscle weakness (generalized): Secondary | ICD-10-CM | POA: Diagnosis not present

## 2021-10-13 DIAGNOSIS — R2689 Other abnormalities of gait and mobility: Secondary | ICD-10-CM | POA: Diagnosis not present

## 2021-10-13 DIAGNOSIS — R262 Difficulty in walking, not elsewhere classified: Secondary | ICD-10-CM | POA: Diagnosis not present

## 2021-10-13 DIAGNOSIS — M6281 Muscle weakness (generalized): Secondary | ICD-10-CM | POA: Diagnosis not present

## 2021-10-13 DIAGNOSIS — M6258 Muscle wasting and atrophy, not elsewhere classified, other site: Secondary | ICD-10-CM | POA: Diagnosis not present

## 2021-10-13 NOTE — Progress Notes (Signed)
Remote pacemaker transmission.   

## 2021-10-18 DIAGNOSIS — M6281 Muscle weakness (generalized): Secondary | ICD-10-CM | POA: Diagnosis not present

## 2021-10-18 DIAGNOSIS — R262 Difficulty in walking, not elsewhere classified: Secondary | ICD-10-CM | POA: Diagnosis not present

## 2021-10-18 DIAGNOSIS — M6258 Muscle wasting and atrophy, not elsewhere classified, other site: Secondary | ICD-10-CM | POA: Diagnosis not present

## 2021-10-18 DIAGNOSIS — R1311 Dysphagia, oral phase: Secondary | ICD-10-CM | POA: Diagnosis not present

## 2021-10-18 DIAGNOSIS — R2689 Other abnormalities of gait and mobility: Secondary | ICD-10-CM | POA: Diagnosis not present

## 2021-10-19 DIAGNOSIS — M6258 Muscle wasting and atrophy, not elsewhere classified, other site: Secondary | ICD-10-CM | POA: Diagnosis not present

## 2021-10-19 DIAGNOSIS — R1311 Dysphagia, oral phase: Secondary | ICD-10-CM | POA: Diagnosis not present

## 2021-10-19 DIAGNOSIS — R262 Difficulty in walking, not elsewhere classified: Secondary | ICD-10-CM | POA: Diagnosis not present

## 2021-10-19 DIAGNOSIS — M6281 Muscle weakness (generalized): Secondary | ICD-10-CM | POA: Diagnosis not present

## 2021-10-19 DIAGNOSIS — R2689 Other abnormalities of gait and mobility: Secondary | ICD-10-CM | POA: Diagnosis not present

## 2021-10-20 DIAGNOSIS — M6258 Muscle wasting and atrophy, not elsewhere classified, other site: Secondary | ICD-10-CM | POA: Diagnosis not present

## 2021-10-20 DIAGNOSIS — R262 Difficulty in walking, not elsewhere classified: Secondary | ICD-10-CM | POA: Diagnosis not present

## 2021-10-20 DIAGNOSIS — M6281 Muscle weakness (generalized): Secondary | ICD-10-CM | POA: Diagnosis not present

## 2021-10-20 DIAGNOSIS — R2689 Other abnormalities of gait and mobility: Secondary | ICD-10-CM | POA: Diagnosis not present

## 2021-10-20 DIAGNOSIS — R1311 Dysphagia, oral phase: Secondary | ICD-10-CM | POA: Diagnosis not present

## 2021-10-21 DIAGNOSIS — R2689 Other abnormalities of gait and mobility: Secondary | ICD-10-CM | POA: Diagnosis not present

## 2021-10-21 DIAGNOSIS — R262 Difficulty in walking, not elsewhere classified: Secondary | ICD-10-CM | POA: Diagnosis not present

## 2021-10-21 DIAGNOSIS — M6258 Muscle wasting and atrophy, not elsewhere classified, other site: Secondary | ICD-10-CM | POA: Diagnosis not present

## 2021-10-21 DIAGNOSIS — R1311 Dysphagia, oral phase: Secondary | ICD-10-CM | POA: Diagnosis not present

## 2021-10-21 DIAGNOSIS — M6281 Muscle weakness (generalized): Secondary | ICD-10-CM | POA: Diagnosis not present

## 2021-10-22 DIAGNOSIS — R262 Difficulty in walking, not elsewhere classified: Secondary | ICD-10-CM | POA: Diagnosis not present

## 2021-10-22 DIAGNOSIS — M6281 Muscle weakness (generalized): Secondary | ICD-10-CM | POA: Diagnosis not present

## 2021-10-22 DIAGNOSIS — R1311 Dysphagia, oral phase: Secondary | ICD-10-CM | POA: Diagnosis not present

## 2021-10-22 DIAGNOSIS — M6258 Muscle wasting and atrophy, not elsewhere classified, other site: Secondary | ICD-10-CM | POA: Diagnosis not present

## 2021-10-22 DIAGNOSIS — R2689 Other abnormalities of gait and mobility: Secondary | ICD-10-CM | POA: Diagnosis not present

## 2021-10-23 DIAGNOSIS — M6281 Muscle weakness (generalized): Secondary | ICD-10-CM | POA: Diagnosis not present

## 2021-10-23 DIAGNOSIS — R2689 Other abnormalities of gait and mobility: Secondary | ICD-10-CM | POA: Diagnosis not present

## 2021-10-23 DIAGNOSIS — R1311 Dysphagia, oral phase: Secondary | ICD-10-CM | POA: Diagnosis not present

## 2021-10-23 DIAGNOSIS — M6258 Muscle wasting and atrophy, not elsewhere classified, other site: Secondary | ICD-10-CM | POA: Diagnosis not present

## 2021-10-23 DIAGNOSIS — R262 Difficulty in walking, not elsewhere classified: Secondary | ICD-10-CM | POA: Diagnosis not present

## 2021-10-25 DIAGNOSIS — R2689 Other abnormalities of gait and mobility: Secondary | ICD-10-CM | POA: Diagnosis not present

## 2021-10-25 DIAGNOSIS — M6281 Muscle weakness (generalized): Secondary | ICD-10-CM | POA: Diagnosis not present

## 2021-10-25 DIAGNOSIS — R1311 Dysphagia, oral phase: Secondary | ICD-10-CM | POA: Diagnosis not present

## 2021-10-25 DIAGNOSIS — M6258 Muscle wasting and atrophy, not elsewhere classified, other site: Secondary | ICD-10-CM | POA: Diagnosis not present

## 2021-10-25 DIAGNOSIS — R262 Difficulty in walking, not elsewhere classified: Secondary | ICD-10-CM | POA: Diagnosis not present

## 2021-10-26 DIAGNOSIS — M6258 Muscle wasting and atrophy, not elsewhere classified, other site: Secondary | ICD-10-CM | POA: Diagnosis not present

## 2021-10-26 DIAGNOSIS — M6281 Muscle weakness (generalized): Secondary | ICD-10-CM | POA: Diagnosis not present

## 2021-10-26 DIAGNOSIS — R262 Difficulty in walking, not elsewhere classified: Secondary | ICD-10-CM | POA: Diagnosis not present

## 2021-10-26 DIAGNOSIS — R2689 Other abnormalities of gait and mobility: Secondary | ICD-10-CM | POA: Diagnosis not present

## 2021-10-26 DIAGNOSIS — R1311 Dysphagia, oral phase: Secondary | ICD-10-CM | POA: Diagnosis not present

## 2021-10-27 DIAGNOSIS — R262 Difficulty in walking, not elsewhere classified: Secondary | ICD-10-CM | POA: Diagnosis not present

## 2021-10-27 DIAGNOSIS — M6281 Muscle weakness (generalized): Secondary | ICD-10-CM | POA: Diagnosis not present

## 2021-10-27 DIAGNOSIS — R1311 Dysphagia, oral phase: Secondary | ICD-10-CM | POA: Diagnosis not present

## 2021-10-27 DIAGNOSIS — R2689 Other abnormalities of gait and mobility: Secondary | ICD-10-CM | POA: Diagnosis not present

## 2021-10-27 DIAGNOSIS — M6258 Muscle wasting and atrophy, not elsewhere classified, other site: Secondary | ICD-10-CM | POA: Diagnosis not present

## 2021-10-28 DIAGNOSIS — R2689 Other abnormalities of gait and mobility: Secondary | ICD-10-CM | POA: Diagnosis not present

## 2021-10-28 DIAGNOSIS — R1311 Dysphagia, oral phase: Secondary | ICD-10-CM | POA: Diagnosis not present

## 2021-10-28 DIAGNOSIS — R262 Difficulty in walking, not elsewhere classified: Secondary | ICD-10-CM | POA: Diagnosis not present

## 2021-10-28 DIAGNOSIS — M6281 Muscle weakness (generalized): Secondary | ICD-10-CM | POA: Diagnosis not present

## 2021-10-28 DIAGNOSIS — M6258 Muscle wasting and atrophy, not elsewhere classified, other site: Secondary | ICD-10-CM | POA: Diagnosis not present

## 2021-10-29 DIAGNOSIS — R2689 Other abnormalities of gait and mobility: Secondary | ICD-10-CM | POA: Diagnosis not present

## 2021-10-29 DIAGNOSIS — R262 Difficulty in walking, not elsewhere classified: Secondary | ICD-10-CM | POA: Diagnosis not present

## 2021-10-29 DIAGNOSIS — M6258 Muscle wasting and atrophy, not elsewhere classified, other site: Secondary | ICD-10-CM | POA: Diagnosis not present

## 2021-10-29 DIAGNOSIS — R1311 Dysphagia, oral phase: Secondary | ICD-10-CM | POA: Diagnosis not present

## 2021-10-29 DIAGNOSIS — M6281 Muscle weakness (generalized): Secondary | ICD-10-CM | POA: Diagnosis not present

## 2021-10-30 DIAGNOSIS — R262 Difficulty in walking, not elsewhere classified: Secondary | ICD-10-CM | POA: Diagnosis not present

## 2021-10-30 DIAGNOSIS — R1311 Dysphagia, oral phase: Secondary | ICD-10-CM | POA: Diagnosis not present

## 2021-10-30 DIAGNOSIS — R2689 Other abnormalities of gait and mobility: Secondary | ICD-10-CM | POA: Diagnosis not present

## 2021-10-30 DIAGNOSIS — M6258 Muscle wasting and atrophy, not elsewhere classified, other site: Secondary | ICD-10-CM | POA: Diagnosis not present

## 2021-10-30 DIAGNOSIS — M6281 Muscle weakness (generalized): Secondary | ICD-10-CM | POA: Diagnosis not present

## 2021-11-02 DIAGNOSIS — R1311 Dysphagia, oral phase: Secondary | ICD-10-CM | POA: Diagnosis not present

## 2021-11-02 DIAGNOSIS — M6258 Muscle wasting and atrophy, not elsewhere classified, other site: Secondary | ICD-10-CM | POA: Diagnosis not present

## 2021-11-02 DIAGNOSIS — R2689 Other abnormalities of gait and mobility: Secondary | ICD-10-CM | POA: Diagnosis not present

## 2021-11-02 DIAGNOSIS — M6281 Muscle weakness (generalized): Secondary | ICD-10-CM | POA: Diagnosis not present

## 2021-11-02 DIAGNOSIS — R262 Difficulty in walking, not elsewhere classified: Secondary | ICD-10-CM | POA: Diagnosis not present

## 2021-11-03 DIAGNOSIS — M6281 Muscle weakness (generalized): Secondary | ICD-10-CM | POA: Diagnosis not present

## 2021-11-03 DIAGNOSIS — R2689 Other abnormalities of gait and mobility: Secondary | ICD-10-CM | POA: Diagnosis not present

## 2021-11-03 DIAGNOSIS — R262 Difficulty in walking, not elsewhere classified: Secondary | ICD-10-CM | POA: Diagnosis not present

## 2021-11-03 DIAGNOSIS — R1311 Dysphagia, oral phase: Secondary | ICD-10-CM | POA: Diagnosis not present

## 2021-11-03 DIAGNOSIS — M6258 Muscle wasting and atrophy, not elsewhere classified, other site: Secondary | ICD-10-CM | POA: Diagnosis not present

## 2021-11-10 DIAGNOSIS — I10 Essential (primary) hypertension: Secondary | ICD-10-CM | POA: Diagnosis not present

## 2021-11-10 DIAGNOSIS — G250 Essential tremor: Secondary | ICD-10-CM | POA: Diagnosis not present

## 2021-11-10 DIAGNOSIS — F419 Anxiety disorder, unspecified: Secondary | ICD-10-CM | POA: Diagnosis not present

## 2021-11-16 DIAGNOSIS — F4321 Adjustment disorder with depressed mood: Secondary | ICD-10-CM | POA: Diagnosis not present

## 2021-11-16 DIAGNOSIS — F331 Major depressive disorder, recurrent, moderate: Secondary | ICD-10-CM | POA: Diagnosis not present

## 2021-11-19 DIAGNOSIS — G250 Essential tremor: Secondary | ICD-10-CM | POA: Diagnosis not present

## 2021-11-19 DIAGNOSIS — F418 Other specified anxiety disorders: Secondary | ICD-10-CM | POA: Diagnosis not present

## 2021-11-19 DIAGNOSIS — I10 Essential (primary) hypertension: Secondary | ICD-10-CM | POA: Diagnosis not present

## 2021-11-19 DIAGNOSIS — I110 Hypertensive heart disease with heart failure: Secondary | ICD-10-CM | POA: Diagnosis not present

## 2021-11-30 ENCOUNTER — Ambulatory Visit: Payer: Self-pay | Admitting: Adult Health

## 2021-11-30 DIAGNOSIS — F411 Generalized anxiety disorder: Secondary | ICD-10-CM | POA: Diagnosis not present

## 2021-11-30 DIAGNOSIS — F331 Major depressive disorder, recurrent, moderate: Secondary | ICD-10-CM | POA: Diagnosis not present

## 2021-11-30 DIAGNOSIS — F4321 Adjustment disorder with depressed mood: Secondary | ICD-10-CM | POA: Diagnosis not present

## 2021-12-16 DIAGNOSIS — W19XXXA Unspecified fall, initial encounter: Secondary | ICD-10-CM | POA: Diagnosis not present

## 2021-12-16 DIAGNOSIS — R031 Nonspecific low blood-pressure reading: Secondary | ICD-10-CM | POA: Diagnosis not present

## 2021-12-16 DIAGNOSIS — S51819A Laceration without foreign body of unspecified forearm, initial encounter: Secondary | ICD-10-CM | POA: Diagnosis not present

## 2021-12-17 DIAGNOSIS — I10 Essential (primary) hypertension: Secondary | ICD-10-CM | POA: Diagnosis not present

## 2021-12-17 DIAGNOSIS — R1310 Dysphagia, unspecified: Secondary | ICD-10-CM | POA: Diagnosis not present

## 2021-12-17 DIAGNOSIS — I480 Paroxysmal atrial fibrillation: Secondary | ICD-10-CM | POA: Diagnosis not present

## 2021-12-17 DIAGNOSIS — M25511 Pain in right shoulder: Secondary | ICD-10-CM | POA: Diagnosis not present

## 2021-12-18 DIAGNOSIS — M6281 Muscle weakness (generalized): Secondary | ICD-10-CM | POA: Diagnosis not present

## 2021-12-18 DIAGNOSIS — R1312 Dysphagia, oropharyngeal phase: Secondary | ICD-10-CM | POA: Diagnosis not present

## 2021-12-18 DIAGNOSIS — Z9181 History of falling: Secondary | ICD-10-CM | POA: Diagnosis not present

## 2021-12-21 DIAGNOSIS — R1312 Dysphagia, oropharyngeal phase: Secondary | ICD-10-CM | POA: Diagnosis not present

## 2021-12-21 DIAGNOSIS — Z9181 History of falling: Secondary | ICD-10-CM | POA: Diagnosis not present

## 2021-12-21 DIAGNOSIS — M6281 Muscle weakness (generalized): Secondary | ICD-10-CM | POA: Diagnosis not present

## 2021-12-22 DIAGNOSIS — I739 Peripheral vascular disease, unspecified: Secondary | ICD-10-CM | POA: Diagnosis not present

## 2021-12-22 DIAGNOSIS — M6281 Muscle weakness (generalized): Secondary | ICD-10-CM | POA: Diagnosis not present

## 2021-12-22 DIAGNOSIS — Z9181 History of falling: Secondary | ICD-10-CM | POA: Diagnosis not present

## 2021-12-22 DIAGNOSIS — R1312 Dysphagia, oropharyngeal phase: Secondary | ICD-10-CM | POA: Diagnosis not present

## 2021-12-22 DIAGNOSIS — L603 Nail dystrophy: Secondary | ICD-10-CM | POA: Diagnosis not present

## 2021-12-23 DIAGNOSIS — M6281 Muscle weakness (generalized): Secondary | ICD-10-CM | POA: Diagnosis not present

## 2021-12-23 DIAGNOSIS — Z9181 History of falling: Secondary | ICD-10-CM | POA: Diagnosis not present

## 2021-12-23 DIAGNOSIS — R1312 Dysphagia, oropharyngeal phase: Secondary | ICD-10-CM | POA: Diagnosis not present

## 2021-12-25 DIAGNOSIS — M6281 Muscle weakness (generalized): Secondary | ICD-10-CM | POA: Diagnosis not present

## 2021-12-25 DIAGNOSIS — R1312 Dysphagia, oropharyngeal phase: Secondary | ICD-10-CM | POA: Diagnosis not present

## 2021-12-25 DIAGNOSIS — Z9181 History of falling: Secondary | ICD-10-CM | POA: Diagnosis not present

## 2021-12-28 DIAGNOSIS — R1312 Dysphagia, oropharyngeal phase: Secondary | ICD-10-CM | POA: Diagnosis not present

## 2021-12-28 DIAGNOSIS — Z9181 History of falling: Secondary | ICD-10-CM | POA: Diagnosis not present

## 2021-12-28 DIAGNOSIS — M6281 Muscle weakness (generalized): Secondary | ICD-10-CM | POA: Diagnosis not present

## 2021-12-29 ENCOUNTER — Ambulatory Visit (INDEPENDENT_AMBULATORY_CARE_PROVIDER_SITE_OTHER): Payer: Medicare Other

## 2021-12-29 DIAGNOSIS — I442 Atrioventricular block, complete: Secondary | ICD-10-CM

## 2021-12-29 LAB — CUP PACEART REMOTE DEVICE CHECK
Battery Remaining Longevity: 37 mo
Battery Voltage: 2.94 V
Brady Statistic AP VP Percent: 98.38 %
Brady Statistic AP VS Percent: 0.05 %
Brady Statistic AS VP Percent: 0.57 %
Brady Statistic AS VS Percent: 1 %
Brady Statistic RA Percent Paced: 99.39 %
Brady Statistic RV Percent Paced: 98.95 %
Date Time Interrogation Session: 20230613044456
Implantable Lead Implant Date: 20190822
Implantable Lead Implant Date: 20190822
Implantable Lead Location: 753859
Implantable Lead Location: 753860
Implantable Lead Model: 3830
Implantable Lead Model: 5076
Implantable Pulse Generator Implant Date: 20190822
Lead Channel Impedance Value: 304 Ohm
Lead Channel Impedance Value: 323 Ohm
Lead Channel Impedance Value: 361 Ohm
Lead Channel Impedance Value: 418 Ohm
Lead Channel Pacing Threshold Amplitude: 0.75 V
Lead Channel Pacing Threshold Amplitude: 1.5 V
Lead Channel Pacing Threshold Pulse Width: 0.4 ms
Lead Channel Pacing Threshold Pulse Width: 0.4 ms
Lead Channel Sensing Intrinsic Amplitude: 10.25 mV
Lead Channel Sensing Intrinsic Amplitude: 10.25 mV
Lead Channel Sensing Intrinsic Amplitude: 5.5 mV
Lead Channel Sensing Intrinsic Amplitude: 5.5 mV
Lead Channel Setting Pacing Amplitude: 1.5 V
Lead Channel Setting Pacing Amplitude: 2.5 V
Lead Channel Setting Pacing Pulse Width: 1 ms
Lead Channel Setting Sensing Sensitivity: 2.8 mV

## 2021-12-31 DIAGNOSIS — R1312 Dysphagia, oropharyngeal phase: Secondary | ICD-10-CM | POA: Diagnosis not present

## 2021-12-31 DIAGNOSIS — M6281 Muscle weakness (generalized): Secondary | ICD-10-CM | POA: Diagnosis not present

## 2021-12-31 DIAGNOSIS — Z9181 History of falling: Secondary | ICD-10-CM | POA: Diagnosis not present

## 2022-01-01 DIAGNOSIS — R1312 Dysphagia, oropharyngeal phase: Secondary | ICD-10-CM | POA: Diagnosis not present

## 2022-01-01 DIAGNOSIS — Z9181 History of falling: Secondary | ICD-10-CM | POA: Diagnosis not present

## 2022-01-01 DIAGNOSIS — M6281 Muscle weakness (generalized): Secondary | ICD-10-CM | POA: Diagnosis not present

## 2022-01-02 DIAGNOSIS — M6281 Muscle weakness (generalized): Secondary | ICD-10-CM | POA: Diagnosis not present

## 2022-01-02 DIAGNOSIS — R1312 Dysphagia, oropharyngeal phase: Secondary | ICD-10-CM | POA: Diagnosis not present

## 2022-01-02 DIAGNOSIS — Z9181 History of falling: Secondary | ICD-10-CM | POA: Diagnosis not present

## 2022-01-03 DIAGNOSIS — R1312 Dysphagia, oropharyngeal phase: Secondary | ICD-10-CM | POA: Diagnosis not present

## 2022-01-03 DIAGNOSIS — Z9181 History of falling: Secondary | ICD-10-CM | POA: Diagnosis not present

## 2022-01-03 DIAGNOSIS — M6281 Muscle weakness (generalized): Secondary | ICD-10-CM | POA: Diagnosis not present

## 2022-01-04 DIAGNOSIS — R1312 Dysphagia, oropharyngeal phase: Secondary | ICD-10-CM | POA: Diagnosis not present

## 2022-01-04 DIAGNOSIS — M6281 Muscle weakness (generalized): Secondary | ICD-10-CM | POA: Diagnosis not present

## 2022-01-04 DIAGNOSIS — Z9181 History of falling: Secondary | ICD-10-CM | POA: Diagnosis not present

## 2022-01-05 DIAGNOSIS — Z9181 History of falling: Secondary | ICD-10-CM | POA: Diagnosis not present

## 2022-01-05 DIAGNOSIS — R1312 Dysphagia, oropharyngeal phase: Secondary | ICD-10-CM | POA: Diagnosis not present

## 2022-01-05 DIAGNOSIS — M6281 Muscle weakness (generalized): Secondary | ICD-10-CM | POA: Diagnosis not present

## 2022-01-07 DIAGNOSIS — S50311D Abrasion of right elbow, subsequent encounter: Secondary | ICD-10-CM | POA: Diagnosis not present

## 2022-01-11 DIAGNOSIS — Z9181 History of falling: Secondary | ICD-10-CM | POA: Diagnosis not present

## 2022-01-11 DIAGNOSIS — M6281 Muscle weakness (generalized): Secondary | ICD-10-CM | POA: Diagnosis not present

## 2022-01-11 DIAGNOSIS — R1312 Dysphagia, oropharyngeal phase: Secondary | ICD-10-CM | POA: Diagnosis not present

## 2022-01-13 DIAGNOSIS — M6281 Muscle weakness (generalized): Secondary | ICD-10-CM | POA: Diagnosis not present

## 2022-01-13 DIAGNOSIS — Z9181 History of falling: Secondary | ICD-10-CM | POA: Diagnosis not present

## 2022-01-13 DIAGNOSIS — R1312 Dysphagia, oropharyngeal phase: Secondary | ICD-10-CM | POA: Diagnosis not present

## 2022-01-13 NOTE — Progress Notes (Signed)
Remote pacemaker transmission.   

## 2022-01-14 DIAGNOSIS — M6281 Muscle weakness (generalized): Secondary | ICD-10-CM | POA: Diagnosis not present

## 2022-01-14 DIAGNOSIS — R1312 Dysphagia, oropharyngeal phase: Secondary | ICD-10-CM | POA: Diagnosis not present

## 2022-01-14 DIAGNOSIS — Z9181 History of falling: Secondary | ICD-10-CM | POA: Diagnosis not present

## 2022-01-15 DIAGNOSIS — I480 Paroxysmal atrial fibrillation: Secondary | ICD-10-CM | POA: Diagnosis not present

## 2022-01-15 DIAGNOSIS — I2510 Atherosclerotic heart disease of native coronary artery without angina pectoris: Secondary | ICD-10-CM | POA: Diagnosis not present

## 2022-01-15 DIAGNOSIS — M6281 Muscle weakness (generalized): Secondary | ICD-10-CM | POA: Diagnosis not present

## 2022-01-15 DIAGNOSIS — Z9181 History of falling: Secondary | ICD-10-CM | POA: Diagnosis not present

## 2022-01-15 DIAGNOSIS — I10 Essential (primary) hypertension: Secondary | ICD-10-CM | POA: Diagnosis not present

## 2022-01-15 DIAGNOSIS — R1312 Dysphagia, oropharyngeal phase: Secondary | ICD-10-CM | POA: Diagnosis not present

## 2022-01-15 DIAGNOSIS — I4892 Unspecified atrial flutter: Secondary | ICD-10-CM | POA: Diagnosis not present

## 2022-01-19 DIAGNOSIS — Z9181 History of falling: Secondary | ICD-10-CM | POA: Diagnosis not present

## 2022-01-19 DIAGNOSIS — M6281 Muscle weakness (generalized): Secondary | ICD-10-CM | POA: Diagnosis not present

## 2022-01-21 DIAGNOSIS — M6281 Muscle weakness (generalized): Secondary | ICD-10-CM | POA: Diagnosis not present

## 2022-01-21 DIAGNOSIS — G250 Essential tremor: Secondary | ICD-10-CM | POA: Diagnosis not present

## 2022-01-21 DIAGNOSIS — F419 Anxiety disorder, unspecified: Secondary | ICD-10-CM | POA: Diagnosis not present

## 2022-01-21 DIAGNOSIS — Z760 Encounter for issue of repeat prescription: Secondary | ICD-10-CM | POA: Diagnosis not present

## 2022-01-21 DIAGNOSIS — Z9181 History of falling: Secondary | ICD-10-CM | POA: Diagnosis not present

## 2022-01-22 DIAGNOSIS — Z9181 History of falling: Secondary | ICD-10-CM | POA: Diagnosis not present

## 2022-01-22 DIAGNOSIS — M6281 Muscle weakness (generalized): Secondary | ICD-10-CM | POA: Diagnosis not present

## 2022-01-27 DIAGNOSIS — M6281 Muscle weakness (generalized): Secondary | ICD-10-CM | POA: Diagnosis not present

## 2022-01-27 DIAGNOSIS — Z9181 History of falling: Secondary | ICD-10-CM | POA: Diagnosis not present

## 2022-01-28 DIAGNOSIS — M6281 Muscle weakness (generalized): Secondary | ICD-10-CM | POA: Diagnosis not present

## 2022-01-28 DIAGNOSIS — Z9181 History of falling: Secondary | ICD-10-CM | POA: Diagnosis not present

## 2022-02-03 IMAGING — DX DG CHEST 1V PORT
1 series · 1 of 1 positions shown · non-contrast
Comparison: Chest x-ray 03/06/2018, chest x-ray 08/23/2020

CLINICAL DATA: cough, fever

EXAM:
PORTABLE CHEST 1 VIEW

[chest ap]
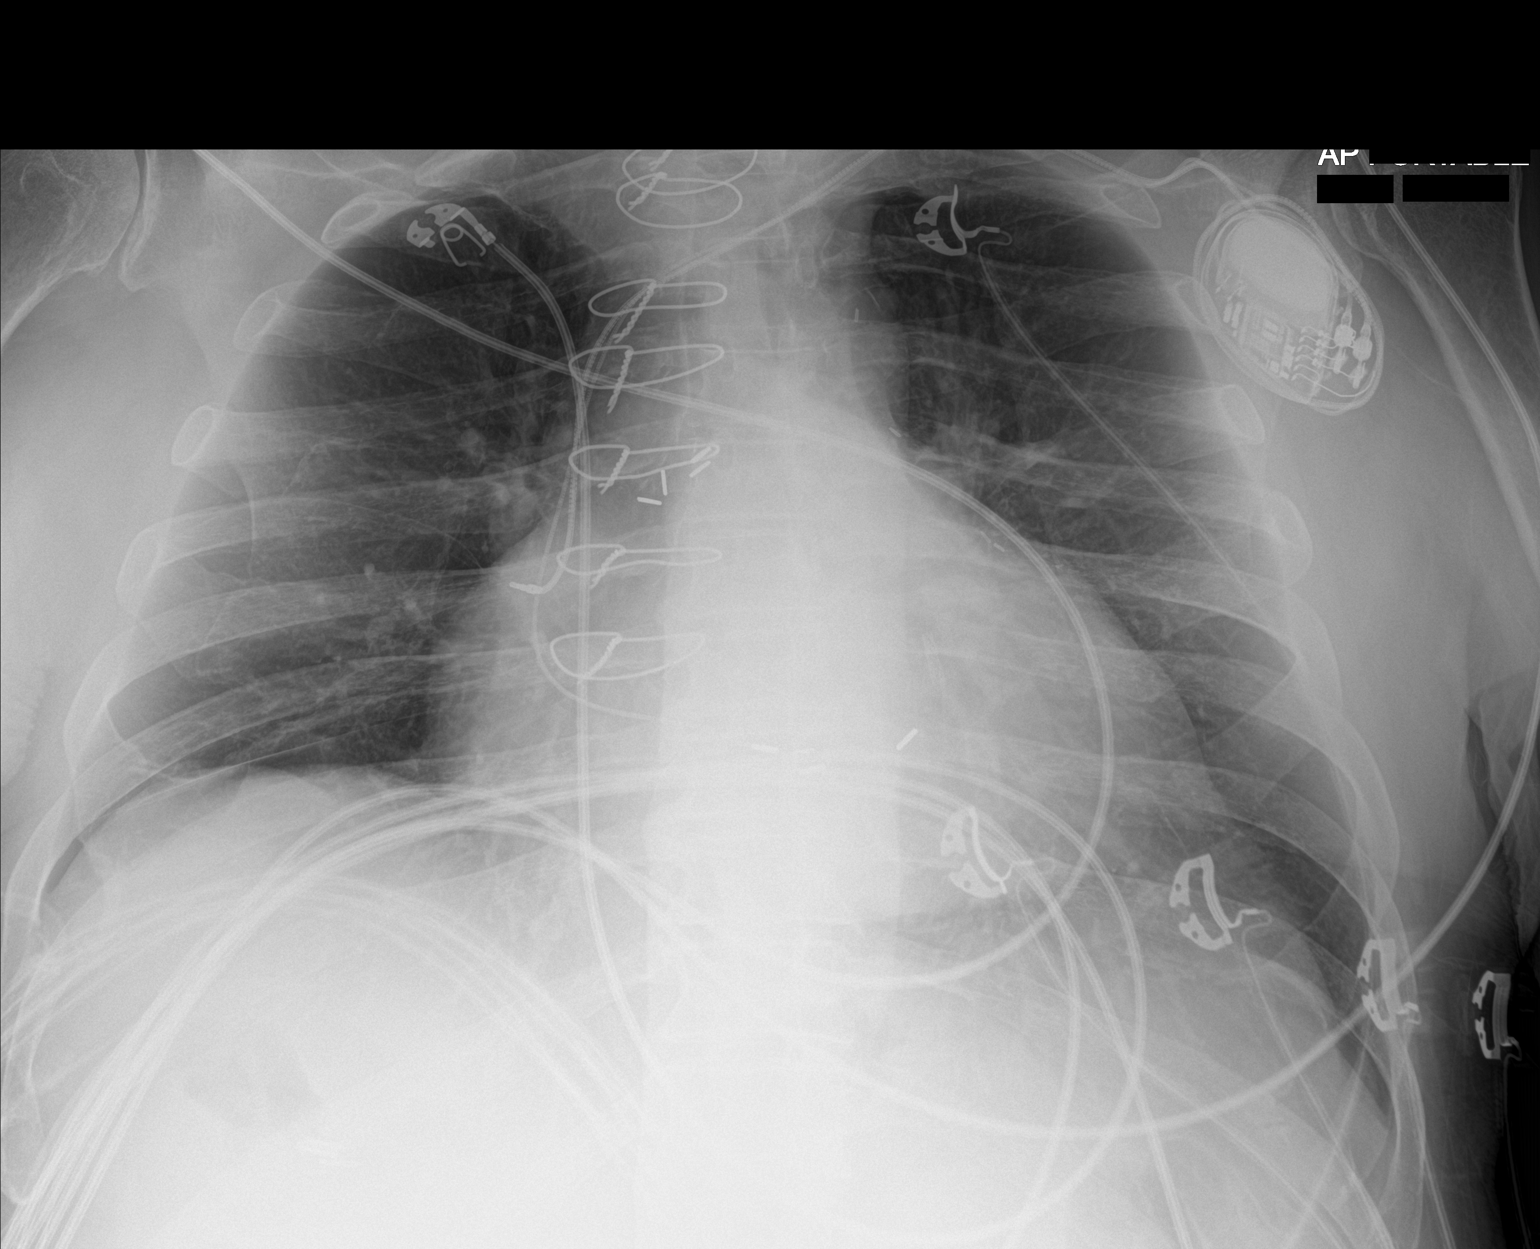

[1 of 1 positions shown; findings below may reference images not displayed]

FINDINGS: Left chest wall dual lead pacemaker is again noted.

Interval increase in cardiac silhouette with some component likely
due to AP portable technique. Otherwise the heart and mediastinal
contours are unchanged. Surgical changes noted overlying the
mediastinum. Coronary artery stent noted.

No focal consolidation. No pulmonary edema. No pleural effusion. No
pneumothorax.

No acute osseous abnormality.
IMPRESSION: Interval increase in cardiac silhouette with some component likely
due to AP portable technique. Underlying pericardial effusion is not
fully excluded.

## 2022-02-04 ENCOUNTER — Emergency Department (HOSPITAL_COMMUNITY)
Admission: EM | Admit: 2022-02-04 | Discharge: 2022-02-04 | Disposition: A | Discharge status: Skilled Nursing Facility | Payer: Medicare Other | Attending: Emergency Medicine | Admitting: Emergency Medicine

## 2022-02-04 DIAGNOSIS — W19XXXA Unspecified fall, initial encounter: Secondary | ICD-10-CM

## 2022-02-04 DIAGNOSIS — Z79899 Other long term (current) drug therapy: Secondary | ICD-10-CM | POA: Insufficient documentation

## 2022-02-04 DIAGNOSIS — S51812A Laceration without foreign body of left forearm, initial encounter: Secondary | ICD-10-CM | POA: Diagnosis not present

## 2022-02-04 DIAGNOSIS — S59901A Unspecified injury of right elbow, initial encounter: Secondary | ICD-10-CM | POA: Diagnosis present

## 2022-02-04 DIAGNOSIS — S51019A Laceration without foreign body of unspecified elbow, initial encounter: Secondary | ICD-10-CM

## 2022-02-04 DIAGNOSIS — I2510 Atherosclerotic heart disease of native coronary artery without angina pectoris: Secondary | ICD-10-CM | POA: Insufficient documentation

## 2022-02-04 DIAGNOSIS — W050XXA Fall from non-moving wheelchair, initial encounter: Secondary | ICD-10-CM | POA: Insufficient documentation

## 2022-02-04 DIAGNOSIS — R6889 Other general symptoms and signs: Secondary | ICD-10-CM | POA: Diagnosis not present

## 2022-02-04 DIAGNOSIS — I10 Essential (primary) hypertension: Secondary | ICD-10-CM | POA: Diagnosis not present

## 2022-02-04 DIAGNOSIS — Z7901 Long term (current) use of anticoagulants: Secondary | ICD-10-CM | POA: Diagnosis not present

## 2022-02-04 DIAGNOSIS — S51819A Laceration without foreign body of unspecified forearm, initial encounter: Secondary | ICD-10-CM

## 2022-02-04 DIAGNOSIS — S51011A Laceration without foreign body of right elbow, initial encounter: Secondary | ICD-10-CM | POA: Diagnosis not present

## 2022-02-04 DIAGNOSIS — Z951 Presence of aortocoronary bypass graft: Secondary | ICD-10-CM | POA: Diagnosis not present

## 2022-02-04 DIAGNOSIS — S51811A Laceration without foreign body of right forearm, initial encounter: Secondary | ICD-10-CM | POA: Diagnosis not present

## 2022-02-04 NOTE — ED Notes (Signed)
Called [facility] for transport back to [facility]

## 2022-02-04 NOTE — ED Triage Notes (Signed)
[facility] brings pt in from [facility] for a fall. Pt slipped out of wheelchair and cut left arm. Denies hitting head.

## 2022-02-04 NOTE — ED Provider Notes (Signed)
[facility] DEPT Provider Note   CSN: [id number] Arrival date & time: 02/04/22  1555     History  Chief Complaint  Paul Frazier presents with   Paul Frazier. is a 88 y.o. male.  Paul Frazier is an 88 year old male with a history of CAD, hypertension, MI status post CABG, hyperlipidemia and essential tremor who currently lives at assisted living at [facility] and reports that he uses a wheelchair and today he was reaching over into a drawer to get something out when he slipped out of his wheelchair falling hitting his arms as he fell.  He denies hitting his head or loss of consciousness.  Paul Frazier does take Eliquis.  Complains of some discomfort to his arms but otherwise has no other complaints.  The history is provided by the Paul Frazier.  Fall       Home Medications Prior to Admission medications   Medication Sig Start Date End Date Taking? Authorizing Provider  atorvastatin (LIPITOR) 40 MG tablet Take 1 tablet daily for Cholesterol Paul Frazier taking differently: Take 40 mg by mouth daily. for Cholesterol 10/01/19   Liane Comber, NP  Azelastine HCl 137 MCG/SPRAY SOLN Place into both nostrils. 12/05/20   [provider]  buPROPion (WELLBUTRIN SR) 200 MG 12 hr tablet Take 200 mg by mouth every morning. 12/03/20   [provider]  buPROPion (WELLBUTRIN XL) 300 MG 24 hr tablet Take 1 tablet daily for Mood Paul Frazier taking differently: Take 300 mg by mouth daily. for Mood 12/29/19   Unk Pinto, MD  busPIRone (BUSPAR) 5 MG tablet Take 5 mg by mouth 2 (two) times daily. 11/17/20   [provider]  Cholecalciferol (VITAMIN D) 125 MCG (5000 UT) CAPS Take 5,000 Units by mouth daily.    [provider]  citalopram (CELEXA) 40 MG tablet Take 1 tablet (40 mg total) by mouth daily. 10/08/19 10/07/20  Vicie Mutters R, PA-C  ELIQUIS 5 MG TABS tablet TAKE 1 TABLET BY MOUTH  TWICE DAILY Paul Frazier taking differently: Take 5 mg by  mouth 2 (two) times daily. 04/07/20   Croitoru, Mihai, MD  gabapentin (NEURONTIN) 300 MG capsule TAKE 1 CAPSULE BY MOUTH 3  TIMES DAILY AS NEEDED FOR  CHRONIC PAIN Paul Frazier taking differently: Take 300 mg by mouth 3 (three) times daily as needed (chronic pain). 03/17/20   Liane Comber, NP  losartan (COZAAR) 25 MG tablet Take 0.5 tablets (12.5 mg total) by mouth daily. 10/05/19 03/25/20  Croitoru, Mihai, MD  methocarbamol (ROBAXIN) 500 MG tablet Take by mouth daily as needed. 11/22/20   [provider]  nitroGLYCERIN (NITROSTAT) 0.4 MG SL tablet Place 1 tablet (0.4 mg total) under the tongue every 5 (five) minutes as needed for chest pain. 12/25/19   Unk Pinto, MD  omeprazole (PRILOSEC OTC) 20 MG tablet Take 20 mg by mouth daily as needed (acid reflux).    [provider]  OVER THE COUNTER MEDICATION Take 2 capsules by mouth daily. Omega Q with resveratrol and turmeric    [provider]  potassium chloride SA (KLOR-CON) 20 MEQ tablet Take 40 mEq by mouth daily.    [provider]  propranolol ER (INDERAL LA) 120 MG 24 hr capsule Take 120 mg by mouth daily. 11/17/20   [provider]      Allergies    Erythromycin, Gabapentin, and Fluorouracil    Review of Systems   Review of Systems  Physical Exam Updated Vital Signs BP (!) 112/58  Pulse 66   Temp 98 F (36.7 C)   Resp 18   SpO2 98%  Physical Exam Vitals and nursing note reviewed.  Constitutional:      General: He is not in acute distress.    Appearance: He is well-developed.  HENT:     Head: Normocephalic and atraumatic.  Eyes:     Conjunctiva/sclera: Conjunctivae normal.     Pupils: Pupils are equal, round, and reactive to light.  Cardiovascular:     Rate and Rhythm: Normal rate.     Pulses: Normal pulses.  Pulmonary:     Effort: Pulmonary effort is normal. No respiratory distress.  Abdominal:     General: There is no distension.     Palpations: Abdomen is soft.     Tenderness:  There is no abdominal tenderness. There is no guarding or rebound.  Musculoskeletal:        General: Signs of injury present. No tenderness. Normal range of motion.       Arms:     Cervical back: Normal range of motion and neck supple.     Right lower leg: Edema present.     Left lower leg: Edema present.  Skin:    General: Skin is warm and dry.     Findings: No erythema or rash.  Neurological:     Mental Status: He is alert and oriented to person, place, and time. Mental status is at baseline.  Psychiatric:        Mood and Affect: Mood normal.        Behavior: Behavior normal.     ED Results / Procedures / Treatments   Labs (all labs ordered are listed, but only abnormal results are displayed) Labs Reviewed - No data to display  EKG None  Radiology No results found.  Procedures Procedures    Medications Ordered in ED Medications - No data to display  ED Course/ Medical Decision Making/ A&P                           Medical Decision Making  Elderly gentleman presenting today with paramedics after sliding out of his wheelchair and sustaining skin tears to bilateral arms.  Paul Frazier does take Eliquis but denies hitting his head is awake alert and has no evidence of head trauma at this time.  Paul Frazier's wounds were dressed.  Steri-Strips were placed over the larger skin tear on the left forearm and bandages were placed.  At this time Paul Frazier is otherwise well-appearing and feel that he is stable for discharge.  He does not need any imaging at this time as there is very low of suspicion for fracture.  Discussed with Paul Frazier wound care and at [facility] he reports that they can change the bandages for him.        Final Clinical Impression(s) / ED Diagnoses Final diagnoses:  Fall, initial encounter  Skin tear of elbow without complication, initial encounter  Skin tear of forearm without complication, initial encounter    Rx / DC Orders ED Discharge Orders     None          Blanchie Dessert, MD 02/04/22 1654

## 2022-02-04 NOTE — Discharge Instructions (Signed)
Change the bandage daily and allow the Steri-Strips to peel off on their own.

## 2022-02-05 DIAGNOSIS — G250 Essential tremor: Secondary | ICD-10-CM | POA: Diagnosis not present

## 2022-02-05 DIAGNOSIS — S51819A Laceration without foreign body of unspecified forearm, initial encounter: Secondary | ICD-10-CM | POA: Diagnosis not present

## 2022-02-05 DIAGNOSIS — W19XXXA Unspecified fall, initial encounter: Secondary | ICD-10-CM | POA: Diagnosis not present

## 2022-02-09 DIAGNOSIS — S41102A Unspecified open wound of left upper arm, initial encounter: Secondary | ICD-10-CM | POA: Diagnosis not present

## 2022-02-11 DIAGNOSIS — F331 Major depressive disorder, recurrent, moderate: Secondary | ICD-10-CM | POA: Diagnosis not present

## 2022-02-16 DIAGNOSIS — S41102A Unspecified open wound of left upper arm, initial encounter: Secondary | ICD-10-CM | POA: Diagnosis not present

## 2022-02-23 DIAGNOSIS — S41102A Unspecified open wound of left upper arm, initial encounter: Secondary | ICD-10-CM | POA: Diagnosis not present

## 2022-02-23 DIAGNOSIS — S41101A Unspecified open wound of right upper arm, initial encounter: Secondary | ICD-10-CM | POA: Diagnosis not present

## 2022-03-02 DIAGNOSIS — Z7901 Long term (current) use of anticoagulants: Secondary | ICD-10-CM | POA: Diagnosis not present

## 2022-03-02 DIAGNOSIS — I5040 Unspecified combined systolic (congestive) and diastolic (congestive) heart failure: Secondary | ICD-10-CM | POA: Diagnosis not present

## 2022-03-02 DIAGNOSIS — S41101A Unspecified open wound of right upper arm, initial encounter: Secondary | ICD-10-CM | POA: Diagnosis not present

## 2022-03-02 DIAGNOSIS — S41102A Unspecified open wound of left upper arm, initial encounter: Secondary | ICD-10-CM | POA: Diagnosis not present

## 2022-03-02 DIAGNOSIS — I480 Paroxysmal atrial fibrillation: Secondary | ICD-10-CM | POA: Diagnosis not present

## 2022-03-02 DIAGNOSIS — I110 Hypertensive heart disease with heart failure: Secondary | ICD-10-CM | POA: Diagnosis not present

## 2022-03-03 DIAGNOSIS — E119 Type 2 diabetes mellitus without complications: Secondary | ICD-10-CM | POA: Diagnosis not present

## 2022-03-03 DIAGNOSIS — E559 Vitamin D deficiency, unspecified: Secondary | ICD-10-CM | POA: Diagnosis not present

## 2022-03-04 DIAGNOSIS — S51802D Unspecified open wound of left forearm, subsequent encounter: Secondary | ICD-10-CM | POA: Diagnosis not present

## 2022-03-05 DIAGNOSIS — R1312 Dysphagia, oropharyngeal phase: Secondary | ICD-10-CM | POA: Diagnosis not present

## 2022-03-05 DIAGNOSIS — R41841 Cognitive communication deficit: Secondary | ICD-10-CM | POA: Diagnosis not present

## 2022-03-05 DIAGNOSIS — R262 Difficulty in walking, not elsewhere classified: Secondary | ICD-10-CM | POA: Diagnosis not present

## 2022-03-05 DIAGNOSIS — M6281 Muscle weakness (generalized): Secondary | ICD-10-CM | POA: Diagnosis not present

## 2022-03-05 DIAGNOSIS — R2689 Other abnormalities of gait and mobility: Secondary | ICD-10-CM | POA: Diagnosis not present

## 2022-03-05 DIAGNOSIS — Z9181 History of falling: Secondary | ICD-10-CM | POA: Diagnosis not present

## 2022-03-08 DIAGNOSIS — Z9181 History of falling: Secondary | ICD-10-CM | POA: Diagnosis not present

## 2022-03-08 DIAGNOSIS — R41841 Cognitive communication deficit: Secondary | ICD-10-CM | POA: Diagnosis not present

## 2022-03-08 DIAGNOSIS — M6281 Muscle weakness (generalized): Secondary | ICD-10-CM | POA: Diagnosis not present

## 2022-03-08 DIAGNOSIS — R262 Difficulty in walking, not elsewhere classified: Secondary | ICD-10-CM | POA: Diagnosis not present

## 2022-03-08 DIAGNOSIS — R2689 Other abnormalities of gait and mobility: Secondary | ICD-10-CM | POA: Diagnosis not present

## 2022-03-08 DIAGNOSIS — R1312 Dysphagia, oropharyngeal phase: Secondary | ICD-10-CM | POA: Diagnosis not present

## 2022-03-09 DIAGNOSIS — R1312 Dysphagia, oropharyngeal phase: Secondary | ICD-10-CM | POA: Diagnosis not present

## 2022-03-09 DIAGNOSIS — S41101A Unspecified open wound of right upper arm, initial encounter: Secondary | ICD-10-CM | POA: Diagnosis not present

## 2022-03-09 DIAGNOSIS — R262 Difficulty in walking, not elsewhere classified: Secondary | ICD-10-CM | POA: Diagnosis not present

## 2022-03-09 DIAGNOSIS — M6281 Muscle weakness (generalized): Secondary | ICD-10-CM | POA: Diagnosis not present

## 2022-03-09 DIAGNOSIS — R41841 Cognitive communication deficit: Secondary | ICD-10-CM | POA: Diagnosis not present

## 2022-03-09 DIAGNOSIS — R2689 Other abnormalities of gait and mobility: Secondary | ICD-10-CM | POA: Diagnosis not present

## 2022-03-09 DIAGNOSIS — S41102A Unspecified open wound of left upper arm, initial encounter: Secondary | ICD-10-CM | POA: Diagnosis not present

## 2022-03-09 DIAGNOSIS — Z9181 History of falling: Secondary | ICD-10-CM | POA: Diagnosis not present

## 2022-03-10 DIAGNOSIS — Z9181 History of falling: Secondary | ICD-10-CM | POA: Diagnosis not present

## 2022-03-10 DIAGNOSIS — R41841 Cognitive communication deficit: Secondary | ICD-10-CM | POA: Diagnosis not present

## 2022-03-10 DIAGNOSIS — R1312 Dysphagia, oropharyngeal phase: Secondary | ICD-10-CM | POA: Diagnosis not present

## 2022-03-10 DIAGNOSIS — M6281 Muscle weakness (generalized): Secondary | ICD-10-CM | POA: Diagnosis not present

## 2022-03-10 DIAGNOSIS — R2689 Other abnormalities of gait and mobility: Secondary | ICD-10-CM | POA: Diagnosis not present

## 2022-03-10 DIAGNOSIS — R262 Difficulty in walking, not elsewhere classified: Secondary | ICD-10-CM | POA: Diagnosis not present

## 2022-03-11 DIAGNOSIS — F331 Major depressive disorder, recurrent, moderate: Secondary | ICD-10-CM | POA: Diagnosis not present

## 2022-03-11 DIAGNOSIS — F411 Generalized anxiety disorder: Secondary | ICD-10-CM | POA: Diagnosis not present

## 2022-03-11 DIAGNOSIS — R41841 Cognitive communication deficit: Secondary | ICD-10-CM | POA: Diagnosis not present

## 2022-03-11 DIAGNOSIS — R262 Difficulty in walking, not elsewhere classified: Secondary | ICD-10-CM | POA: Diagnosis not present

## 2022-03-11 DIAGNOSIS — Z9181 History of falling: Secondary | ICD-10-CM | POA: Diagnosis not present

## 2022-03-11 DIAGNOSIS — R1312 Dysphagia, oropharyngeal phase: Secondary | ICD-10-CM | POA: Diagnosis not present

## 2022-03-11 DIAGNOSIS — R2689 Other abnormalities of gait and mobility: Secondary | ICD-10-CM | POA: Diagnosis not present

## 2022-03-11 DIAGNOSIS — M6281 Muscle weakness (generalized): Secondary | ICD-10-CM | POA: Diagnosis not present

## 2022-03-12 DIAGNOSIS — Z9181 History of falling: Secondary | ICD-10-CM | POA: Diagnosis not present

## 2022-03-12 DIAGNOSIS — R41841 Cognitive communication deficit: Secondary | ICD-10-CM | POA: Diagnosis not present

## 2022-03-12 DIAGNOSIS — R2689 Other abnormalities of gait and mobility: Secondary | ICD-10-CM | POA: Diagnosis not present

## 2022-03-12 DIAGNOSIS — M6281 Muscle weakness (generalized): Secondary | ICD-10-CM | POA: Diagnosis not present

## 2022-03-12 DIAGNOSIS — R1312 Dysphagia, oropharyngeal phase: Secondary | ICD-10-CM | POA: Diagnosis not present

## 2022-03-12 DIAGNOSIS — R262 Difficulty in walking, not elsewhere classified: Secondary | ICD-10-CM | POA: Diagnosis not present

## 2022-03-16 DIAGNOSIS — S41101A Unspecified open wound of right upper arm, initial encounter: Secondary | ICD-10-CM | POA: Diagnosis not present

## 2022-03-16 DIAGNOSIS — S41102A Unspecified open wound of left upper arm, initial encounter: Secondary | ICD-10-CM | POA: Diagnosis not present

## 2022-03-17 DIAGNOSIS — E119 Type 2 diabetes mellitus without complications: Secondary | ICD-10-CM | POA: Diagnosis not present

## 2022-03-17 DIAGNOSIS — E559 Vitamin D deficiency, unspecified: Secondary | ICD-10-CM | POA: Diagnosis not present

## 2022-03-30 ENCOUNTER — Ambulatory Visit (INDEPENDENT_AMBULATORY_CARE_PROVIDER_SITE_OTHER): Payer: Medicare Other

## 2022-03-30 DIAGNOSIS — I442 Atrioventricular block, complete: Secondary | ICD-10-CM | POA: Diagnosis not present

## 2022-03-31 DIAGNOSIS — G250 Essential tremor: Secondary | ICD-10-CM | POA: Diagnosis not present

## 2022-03-31 DIAGNOSIS — I480 Paroxysmal atrial fibrillation: Secondary | ICD-10-CM | POA: Diagnosis not present

## 2022-03-31 DIAGNOSIS — I5040 Unspecified combined systolic (congestive) and diastolic (congestive) heart failure: Secondary | ICD-10-CM | POA: Diagnosis not present

## 2022-03-31 DIAGNOSIS — I110 Hypertensive heart disease with heart failure: Secondary | ICD-10-CM | POA: Diagnosis not present

## 2022-04-02 LAB — CUP PACEART REMOTE DEVICE CHECK
Battery Remaining Longevity: 32 mo
Battery Voltage: 2.93 V
Brady Statistic AP VP Percent: 98.83 %
Brady Statistic AP VS Percent: 0.03 %
Brady Statistic AS VP Percent: 0.64 %
Brady Statistic AS VS Percent: 0.49 %
Brady Statistic RA Percent Paced: 99.33 %
Brady Statistic RV Percent Paced: 99.47 %
Date Time Interrogation Session: 20230912062358
Implantable Lead Implant Date: 20190822
Implantable Lead Implant Date: 20190822
Implantable Lead Location: 753859
Implantable Lead Location: 753860
Implantable Lead Model: 3830
Implantable Lead Model: 5076
Implantable Pulse Generator Implant Date: 20190822
Lead Channel Impedance Value: 304 Ohm
Lead Channel Impedance Value: 323 Ohm
Lead Channel Impedance Value: 380 Ohm
Lead Channel Impedance Value: 418 Ohm
Lead Channel Pacing Threshold Amplitude: 0.75 V
Lead Channel Pacing Threshold Amplitude: 1.5 V
Lead Channel Pacing Threshold Pulse Width: 0.4 ms
Lead Channel Pacing Threshold Pulse Width: 0.4 ms
Lead Channel Sensing Intrinsic Amplitude: 10.25 mV
Lead Channel Sensing Intrinsic Amplitude: 10.25 mV
Lead Channel Sensing Intrinsic Amplitude: 5.625 mV
Lead Channel Sensing Intrinsic Amplitude: 5.625 mV
Lead Channel Setting Pacing Amplitude: 1.5 V
Lead Channel Setting Pacing Amplitude: 2.5 V
Lead Channel Setting Pacing Pulse Width: 1 ms
Lead Channel Setting Sensing Sensitivity: 2.8 mV

## 2022-04-12 DIAGNOSIS — F411 Generalized anxiety disorder: Secondary | ICD-10-CM | POA: Diagnosis not present

## 2022-04-12 DIAGNOSIS — F331 Major depressive disorder, recurrent, moderate: Secondary | ICD-10-CM | POA: Diagnosis not present

## 2022-04-15 NOTE — Progress Notes (Signed)
Remote pacemaker transmission.   

## 2022-04-26 ENCOUNTER — Telehealth: Payer: Self-pay

## 2022-04-26 NOTE — Patient Outreach (Signed)
  Care Coordination   04/26/2022 Name: Paul Frazier. MRN: [id number] DOB: 04/04/1933   Care Coordination Outreach Attempts:  An unsuccessful telephone outreach was attempted today to offer the patient information about available care coordination services as a benefit of their health plan.   Follow Up Plan:  Additional outreach attempts will be made to offer the patient care coordination information and services.   Encounter Outcome:  No Answer  Care Coordination Interventions Activated:  No   Care Coordination Interventions:  No, not indicated    Jone Baseman, RN, MSN [facility] Care Management Care Management Coordinator Direct Line [phone]

## 2022-04-27 DIAGNOSIS — R972 Elevated prostate specific antigen [PSA]: Secondary | ICD-10-CM | POA: Diagnosis not present

## 2022-04-27 DIAGNOSIS — R350 Frequency of micturition: Secondary | ICD-10-CM | POA: Diagnosis not present

## 2022-04-27 DIAGNOSIS — R3915 Urgency of urination: Secondary | ICD-10-CM | POA: Diagnosis not present

## 2022-04-27 DIAGNOSIS — R3912 Poor urinary stream: Secondary | ICD-10-CM | POA: Diagnosis not present

## 2022-05-04 DIAGNOSIS — F331 Major depressive disorder, recurrent, moderate: Secondary | ICD-10-CM | POA: Diagnosis not present

## 2022-05-10 ENCOUNTER — Telehealth: Payer: Self-pay

## 2022-05-10 ENCOUNTER — Encounter: Payer: Self-pay | Admitting: Internal Medicine

## 2022-05-10 NOTE — Patient Outreach (Signed)
  Care Coordination   05/10/2022 Name: Paul Frazier. MRN: [id number] DOB: Jan 10, 1933   Care Coordination Outreach Attempts:  A second unsuccessful outreach was attempted today to offer the patient with information about available care coordination services as a benefit of their health plan.     Follow Up Plan:  Additional outreach attempts will be made to offer the patient care coordination information and services.   Encounter Outcome:  No Answer  Care Coordination Interventions Activated:  No   Care Coordination Interventions:  No, not indicated    Jone Baseman, RN, MSN [facility] Care Management Care Management Coordinator Direct Line [phone]

## 2022-05-11 DIAGNOSIS — H52203 Unspecified astigmatism, bilateral: Secondary | ICD-10-CM | POA: Diagnosis not present

## 2022-05-18 DIAGNOSIS — I2510 Atherosclerotic heart disease of native coronary artery without angina pectoris: Secondary | ICD-10-CM | POA: Diagnosis not present

## 2022-05-18 DIAGNOSIS — I5040 Unspecified combined systolic (congestive) and diastolic (congestive) heart failure: Secondary | ICD-10-CM | POA: Diagnosis not present

## 2022-05-18 DIAGNOSIS — Z951 Presence of aortocoronary bypass graft: Secondary | ICD-10-CM | POA: Diagnosis not present

## 2022-05-18 DIAGNOSIS — I110 Hypertensive heart disease with heart failure: Secondary | ICD-10-CM | POA: Diagnosis not present

## 2022-05-20 DIAGNOSIS — R0981 Nasal congestion: Secondary | ICD-10-CM | POA: Diagnosis not present

## 2022-05-25 ENCOUNTER — Telehealth: Payer: Self-pay

## 2022-05-25 DIAGNOSIS — N403 Nodular prostate with lower urinary tract symptoms: Secondary | ICD-10-CM | POA: Diagnosis not present

## 2022-05-25 DIAGNOSIS — R310 Gross hematuria: Secondary | ICD-10-CM | POA: Diagnosis not present

## 2022-05-25 DIAGNOSIS — R350 Frequency of micturition: Secondary | ICD-10-CM | POA: Diagnosis not present

## 2022-05-25 DIAGNOSIS — R972 Elevated prostate specific antigen [PSA]: Secondary | ICD-10-CM | POA: Diagnosis not present

## 2022-05-25 NOTE — Patient Outreach (Signed)
  Care Coordination   05/25/2022 Name: Paul Frazier. MRN: [id number] DOB: 05/16/1933   Care Coordination Outreach Attempts:  A third unsuccessful outreach was attempted today to offer the patient with information about available care coordination services as a benefit of their health plan.   Follow Up Plan:  No further outreach attempts will be made at this time. We have been unable to contact the patient to offer or enroll patient in care coordination services  Encounter Outcome:  No Answer  Care Coordination Interventions Activated:  No   Care Coordination Interventions:  No, not indicated    Jone Baseman, RN, MSN [place] Management Care Management Coordinator Direct Line [phone]

## 2022-05-26 DIAGNOSIS — R319 Hematuria, unspecified: Secondary | ICD-10-CM | POA: Diagnosis not present

## 2022-05-26 DIAGNOSIS — Z7189 Other specified counseling: Secondary | ICD-10-CM | POA: Diagnosis not present

## 2022-05-26 DIAGNOSIS — R338 Other retention of urine: Secondary | ICD-10-CM | POA: Diagnosis not present

## 2022-05-26 DIAGNOSIS — R972 Elevated prostate specific antigen [PSA]: Secondary | ICD-10-CM | POA: Diagnosis not present

## 2022-05-26 DIAGNOSIS — L570 Actinic keratosis: Secondary | ICD-10-CM | POA: Diagnosis not present

## 2022-05-26 DIAGNOSIS — L814 Other melanin hyperpigmentation: Secondary | ICD-10-CM | POA: Diagnosis not present

## 2022-05-26 DIAGNOSIS — L821 Other seborrheic keratosis: Secondary | ICD-10-CM | POA: Diagnosis not present

## 2022-05-26 DIAGNOSIS — D1801 Hemangioma of skin and subcutaneous tissue: Secondary | ICD-10-CM | POA: Diagnosis not present

## 2022-06-01 DIAGNOSIS — L853 Xerosis cutis: Secondary | ICD-10-CM | POA: Diagnosis not present

## 2022-06-01 DIAGNOSIS — L304 Erythema intertrigo: Secondary | ICD-10-CM | POA: Diagnosis not present

## 2022-06-02 DIAGNOSIS — M6258 Muscle wasting and atrophy, not elsewhere classified, other site: Secondary | ICD-10-CM | POA: Diagnosis not present

## 2022-06-02 DIAGNOSIS — R2689 Other abnormalities of gait and mobility: Secondary | ICD-10-CM | POA: Diagnosis not present

## 2022-06-02 DIAGNOSIS — M6281 Muscle weakness (generalized): Secondary | ICD-10-CM | POA: Diagnosis not present

## 2022-06-03 DIAGNOSIS — R2689 Other abnormalities of gait and mobility: Secondary | ICD-10-CM | POA: Diagnosis not present

## 2022-06-03 DIAGNOSIS — M6258 Muscle wasting and atrophy, not elsewhere classified, other site: Secondary | ICD-10-CM | POA: Diagnosis not present

## 2022-06-03 DIAGNOSIS — M6281 Muscle weakness (generalized): Secondary | ICD-10-CM | POA: Diagnosis not present

## 2022-06-03 DIAGNOSIS — N201 Calculus of ureter: Secondary | ICD-10-CM | POA: Diagnosis not present

## 2022-06-03 DIAGNOSIS — R310 Gross hematuria: Secondary | ICD-10-CM | POA: Diagnosis not present

## 2022-06-07 DIAGNOSIS — M6281 Muscle weakness (generalized): Secondary | ICD-10-CM | POA: Diagnosis not present

## 2022-06-07 DIAGNOSIS — R748 Abnormal levels of other serum enzymes: Secondary | ICD-10-CM | POA: Diagnosis not present

## 2022-06-07 DIAGNOSIS — M6258 Muscle wasting and atrophy, not elsewhere classified, other site: Secondary | ICD-10-CM | POA: Diagnosis not present

## 2022-06-07 DIAGNOSIS — R2689 Other abnormalities of gait and mobility: Secondary | ICD-10-CM | POA: Diagnosis not present

## 2022-06-07 DIAGNOSIS — E782 Mixed hyperlipidemia: Secondary | ICD-10-CM | POA: Diagnosis not present

## 2022-06-08 DIAGNOSIS — M6281 Muscle weakness (generalized): Secondary | ICD-10-CM | POA: Diagnosis not present

## 2022-06-08 DIAGNOSIS — M6258 Muscle wasting and atrophy, not elsewhere classified, other site: Secondary | ICD-10-CM | POA: Diagnosis not present

## 2022-06-08 DIAGNOSIS — R2689 Other abnormalities of gait and mobility: Secondary | ICD-10-CM | POA: Diagnosis not present

## 2022-06-09 DIAGNOSIS — M6281 Muscle weakness (generalized): Secondary | ICD-10-CM | POA: Diagnosis not present

## 2022-06-09 DIAGNOSIS — R2689 Other abnormalities of gait and mobility: Secondary | ICD-10-CM | POA: Diagnosis not present

## 2022-06-09 DIAGNOSIS — M6258 Muscle wasting and atrophy, not elsewhere classified, other site: Secondary | ICD-10-CM | POA: Diagnosis not present

## 2022-06-10 DIAGNOSIS — R2689 Other abnormalities of gait and mobility: Secondary | ICD-10-CM | POA: Diagnosis not present

## 2022-06-10 DIAGNOSIS — M6281 Muscle weakness (generalized): Secondary | ICD-10-CM | POA: Diagnosis not present

## 2022-06-10 DIAGNOSIS — M6258 Muscle wasting and atrophy, not elsewhere classified, other site: Secondary | ICD-10-CM | POA: Diagnosis not present

## 2022-06-11 DIAGNOSIS — M6281 Muscle weakness (generalized): Secondary | ICD-10-CM | POA: Diagnosis not present

## 2022-06-11 DIAGNOSIS — M6258 Muscle wasting and atrophy, not elsewhere classified, other site: Secondary | ICD-10-CM | POA: Diagnosis not present

## 2022-06-11 DIAGNOSIS — R2689 Other abnormalities of gait and mobility: Secondary | ICD-10-CM | POA: Diagnosis not present

## 2022-06-14 DIAGNOSIS — M6281 Muscle weakness (generalized): Secondary | ICD-10-CM | POA: Diagnosis not present

## 2022-06-14 DIAGNOSIS — M6258 Muscle wasting and atrophy, not elsewhere classified, other site: Secondary | ICD-10-CM | POA: Diagnosis not present

## 2022-06-14 DIAGNOSIS — R2689 Other abnormalities of gait and mobility: Secondary | ICD-10-CM | POA: Diagnosis not present

## 2022-06-15 DIAGNOSIS — M6281 Muscle weakness (generalized): Secondary | ICD-10-CM | POA: Diagnosis not present

## 2022-06-15 DIAGNOSIS — R2689 Other abnormalities of gait and mobility: Secondary | ICD-10-CM | POA: Diagnosis not present

## 2022-06-15 DIAGNOSIS — M6258 Muscle wasting and atrophy, not elsewhere classified, other site: Secondary | ICD-10-CM | POA: Diagnosis not present

## 2022-06-16 DIAGNOSIS — M6281 Muscle weakness (generalized): Secondary | ICD-10-CM | POA: Diagnosis not present

## 2022-06-16 DIAGNOSIS — R2689 Other abnormalities of gait and mobility: Secondary | ICD-10-CM | POA: Diagnosis not present

## 2022-06-16 DIAGNOSIS — M6258 Muscle wasting and atrophy, not elsewhere classified, other site: Secondary | ICD-10-CM | POA: Diagnosis not present

## 2022-06-17 DIAGNOSIS — R2689 Other abnormalities of gait and mobility: Secondary | ICD-10-CM | POA: Diagnosis not present

## 2022-06-17 DIAGNOSIS — M6258 Muscle wasting and atrophy, not elsewhere classified, other site: Secondary | ICD-10-CM | POA: Diagnosis not present

## 2022-06-17 DIAGNOSIS — M6281 Muscle weakness (generalized): Secondary | ICD-10-CM | POA: Diagnosis not present

## 2022-06-21 DIAGNOSIS — M6258 Muscle wasting and atrophy, not elsewhere classified, other site: Secondary | ICD-10-CM | POA: Diagnosis not present

## 2022-06-21 DIAGNOSIS — M6281 Muscle weakness (generalized): Secondary | ICD-10-CM | POA: Diagnosis not present

## 2022-06-21 DIAGNOSIS — R2681 Unsteadiness on feet: Secondary | ICD-10-CM | POA: Diagnosis not present

## 2022-06-21 DIAGNOSIS — R2689 Other abnormalities of gait and mobility: Secondary | ICD-10-CM | POA: Diagnosis not present

## 2022-06-23 DIAGNOSIS — M6258 Muscle wasting and atrophy, not elsewhere classified, other site: Secondary | ICD-10-CM | POA: Diagnosis not present

## 2022-06-23 DIAGNOSIS — R2681 Unsteadiness on feet: Secondary | ICD-10-CM | POA: Diagnosis not present

## 2022-06-23 DIAGNOSIS — M6281 Muscle weakness (generalized): Secondary | ICD-10-CM | POA: Diagnosis not present

## 2022-06-23 DIAGNOSIS — R2689 Other abnormalities of gait and mobility: Secondary | ICD-10-CM | POA: Diagnosis not present

## 2022-06-24 DIAGNOSIS — L853 Xerosis cutis: Secondary | ICD-10-CM | POA: Diagnosis not present

## 2022-06-24 DIAGNOSIS — R0981 Nasal congestion: Secondary | ICD-10-CM | POA: Diagnosis not present

## 2022-06-25 DIAGNOSIS — R310 Gross hematuria: Secondary | ICD-10-CM | POA: Diagnosis not present

## 2022-06-25 DIAGNOSIS — N201 Calculus of ureter: Secondary | ICD-10-CM | POA: Diagnosis not present

## 2022-06-29 ENCOUNTER — Ambulatory Visit (INDEPENDENT_AMBULATORY_CARE_PROVIDER_SITE_OTHER): Payer: Medicare Other

## 2022-06-29 DIAGNOSIS — I442 Atrioventricular block, complete: Secondary | ICD-10-CM

## 2022-06-29 LAB — CUP PACEART REMOTE DEVICE CHECK
Battery Remaining Longevity: 26 mo
Battery Voltage: 2.92 V
Brady Statistic AP VP Percent: 98.63 %
Brady Statistic AP VS Percent: 0.06 %
Brady Statistic AS VP Percent: 0.41 %
Brady Statistic AS VS Percent: 0.9 %
Brady Statistic RA Percent Paced: 99.55 %
Brady Statistic RV Percent Paced: 99.04 %
Date Time Interrogation Session: 20231211183306
Implantable Lead Connection Status: 753985
Implantable Lead Connection Status: 753985
Implantable Lead Implant Date: 20190822
Implantable Lead Implant Date: 20190822
Implantable Lead Location: 753859
Implantable Lead Location: 753860
Implantable Lead Model: 3830
Implantable Lead Model: 5076
Implantable Pulse Generator Implant Date: 20190822
Lead Channel Impedance Value: 323 Ohm
Lead Channel Impedance Value: 323 Ohm
Lead Channel Impedance Value: 380 Ohm
Lead Channel Impedance Value: 437 Ohm
Lead Channel Pacing Threshold Amplitude: 0.75 V
Lead Channel Pacing Threshold Amplitude: 1.375 V
Lead Channel Pacing Threshold Pulse Width: 0.4 ms
Lead Channel Pacing Threshold Pulse Width: 0.4 ms
Lead Channel Sensing Intrinsic Amplitude: 10.25 mV
Lead Channel Sensing Intrinsic Amplitude: 10.25 mV
Lead Channel Sensing Intrinsic Amplitude: 4.75 mV
Lead Channel Sensing Intrinsic Amplitude: 4.75 mV
Lead Channel Setting Pacing Amplitude: 1.5 V
Lead Channel Setting Pacing Amplitude: 2.5 V
Lead Channel Setting Pacing Pulse Width: 1 ms
Lead Channel Setting Sensing Sensitivity: 2.8 mV
Zone Setting Status: 755011
Zone Setting Status: 755011

## 2022-07-14 DIAGNOSIS — R197 Diarrhea, unspecified: Secondary | ICD-10-CM | POA: Diagnosis not present

## 2022-07-14 DIAGNOSIS — I959 Hypotension, unspecified: Secondary | ICD-10-CM | POA: Diagnosis not present

## 2022-07-14 DIAGNOSIS — R062 Wheezing: Secondary | ICD-10-CM | POA: Diagnosis not present

## 2022-07-14 DIAGNOSIS — R059 Cough, unspecified: Secondary | ICD-10-CM | POA: Diagnosis not present

## 2022-07-14 DIAGNOSIS — K5901 Slow transit constipation: Secondary | ICD-10-CM | POA: Diagnosis not present

## 2022-07-14 DIAGNOSIS — R748 Abnormal levels of other serum enzymes: Secondary | ICD-10-CM | POA: Diagnosis not present

## 2022-07-15 DIAGNOSIS — R059 Cough, unspecified: Secondary | ICD-10-CM | POA: Diagnosis not present

## 2022-07-15 DIAGNOSIS — Z20828 Contact with and (suspected) exposure to other viral communicable diseases: Secondary | ICD-10-CM | POA: Diagnosis not present

## 2022-07-16 DIAGNOSIS — M6281 Muscle weakness (generalized): Secondary | ICD-10-CM | POA: Diagnosis not present

## 2022-07-16 DIAGNOSIS — R2681 Unsteadiness on feet: Secondary | ICD-10-CM | POA: Diagnosis not present

## 2022-07-16 DIAGNOSIS — R2689 Other abnormalities of gait and mobility: Secondary | ICD-10-CM | POA: Diagnosis not present

## 2022-07-16 DIAGNOSIS — M6258 Muscle wasting and atrophy, not elsewhere classified, other site: Secondary | ICD-10-CM | POA: Diagnosis not present

## 2022-07-17 ENCOUNTER — Emergency Department (HOSPITAL_COMMUNITY)
Admission: EM | Admit: 2022-07-17 | Discharge: 2022-07-17 | Disposition: A | Discharge status: Skilled Nursing Facility | Payer: Medicare Other | Attending: Emergency Medicine | Admitting: Emergency Medicine

## 2022-07-17 ENCOUNTER — Encounter (HOSPITAL_COMMUNITY): Payer: Self-pay | Admitting: Emergency Medicine

## 2022-07-17 ENCOUNTER — Emergency Department (HOSPITAL_COMMUNITY): Payer: Medicare Other

## 2022-07-17 DIAGNOSIS — Z950 Presence of cardiac pacemaker: Secondary | ICD-10-CM | POA: Insufficient documentation

## 2022-07-17 DIAGNOSIS — D696 Thrombocytopenia, unspecified: Secondary | ICD-10-CM | POA: Diagnosis not present

## 2022-07-17 DIAGNOSIS — J101 Influenza due to other identified influenza virus with other respiratory manifestations: Secondary | ICD-10-CM | POA: Diagnosis not present

## 2022-07-17 DIAGNOSIS — I509 Heart failure, unspecified: Secondary | ICD-10-CM | POA: Insufficient documentation

## 2022-07-17 DIAGNOSIS — Z7901 Long term (current) use of anticoagulants: Secondary | ICD-10-CM | POA: Diagnosis not present

## 2022-07-17 DIAGNOSIS — I2510 Atherosclerotic heart disease of native coronary artery without angina pectoris: Secondary | ICD-10-CM | POA: Insufficient documentation

## 2022-07-17 DIAGNOSIS — Z20822 Contact with and (suspected) exposure to covid-19: Secondary | ICD-10-CM | POA: Insufficient documentation

## 2022-07-17 DIAGNOSIS — Z743 Need for continuous supervision: Secondary | ICD-10-CM | POA: Diagnosis not present

## 2022-07-17 DIAGNOSIS — I5023 Acute on chronic systolic (congestive) heart failure: Secondary | ICD-10-CM | POA: Diagnosis not present

## 2022-07-17 DIAGNOSIS — J111 Influenza due to unidentified influenza virus with other respiratory manifestations: Secondary | ICD-10-CM | POA: Diagnosis not present

## 2022-07-17 DIAGNOSIS — R0602 Shortness of breath: Secondary | ICD-10-CM | POA: Diagnosis not present

## 2022-07-17 DIAGNOSIS — I110 Hypertensive heart disease with heart failure: Secondary | ICD-10-CM | POA: Diagnosis not present

## 2022-07-17 DIAGNOSIS — R059 Cough, unspecified: Secondary | ICD-10-CM | POA: Diagnosis not present

## 2022-07-17 LAB — CBC
HCT: 40.4 % (ref 39.0–52.0)
Hemoglobin: 13.4 g/dL (ref 13.0–17.0)
MCH: 31.4 pg (ref 26.0–34.0)
MCHC: 33.2 g/dL (ref 30.0–36.0)
MCV: 94.6 fL (ref 80.0–100.0)
Platelets: 118 10*3/uL — ABNORMAL LOW (ref 150–400)
RBC: 4.27 MIL/uL (ref 4.22–5.81)
RDW: 14.4 % (ref 11.5–15.5)
WBC: 5.4 10*3/uL (ref 4.0–10.5)
nRBC: 0 % (ref 0.0–0.2)

## 2022-07-17 LAB — RESP PANEL BY RT-PCR (RSV, FLU A&B, COVID)  RVPGX2
Influenza A by PCR: POSITIVE — AB
Influenza B by PCR: NEGATIVE
Resp Syncytial Virus by PCR: NEGATIVE
SARS Coronavirus 2 by RT PCR: NEGATIVE

## 2022-07-17 LAB — BASIC METABOLIC PANEL
Anion gap: 6 (ref 5–15)
BUN: 14 mg/dL (ref 8–23)
CO2: 27 mmol/L (ref 22–32)
Calcium: 8.4 mg/dL — ABNORMAL LOW (ref 8.9–10.3)
Chloride: 105 mmol/L (ref 98–111)
Creatinine, Ser: 0.84 mg/dL (ref 0.61–1.24)
GFR, Estimated: >60 mL/min (ref >60)
Glucose, Bld: 109 mg/dL — ABNORMAL HIGH (ref 70–99)
Potassium: 3.9 mmol/L (ref 3.5–5.1)
Sodium: 138 mmol/L (ref 135–145)

## 2022-07-17 LAB — BRAIN NATRIURETIC PEPTIDE: B Natriuretic Peptide: 582 pg/mL — ABNORMAL HIGH (ref 0.0–100.0)

## 2022-07-17 MED ORDER — IPRATROPIUM-ALBUTEROL 0.5-2.5 (3) MG/3ML IN SOLN
3.0000 mL | Freq: Once | RESPIRATORY_TRACT | Status: AC
  Administered 2022-07-17: 3 mL via RESPIRATORY_TRACT
  Filled 2022-07-17: qty 3

## 2022-07-17 MED ORDER — PREDNISONE 20 MG PO TABS: 40.0000 mg | ORAL_TABLET | Freq: Every day | ORAL | 0 refills | Status: DC

## 2022-07-17 MED ORDER — FUROSEMIDE 40 MG PO TABS
40.0000 mg | ORAL_TABLET | Freq: Once | ORAL | Status: AC
  Administered 2022-07-17: 40 mg via ORAL
  Filled 2022-07-17: qty 1

## 2022-07-17 MED ORDER — FUROSEMIDE 10 MG/ML IJ SOLN
40.0000 mg | Freq: Once | INTRAMUSCULAR | Status: DC
  Filled 2022-07-17: qty 4

## 2022-07-17 MED ORDER — ALBUTEROL SULFATE HFA 108 (90 BASE) MCG/ACT IN AERS: 1.0000 | INHALATION_SPRAY | Freq: Four times a day (QID) | RESPIRATORY_TRACT | 1 refills | Status: DC | PRN

## 2022-07-17 NOTE — ED Notes (Signed)
ED Provider at bedside. 

## 2022-07-17 NOTE — Discharge Instructions (Addendum)
You have the flu this is a viral infection that will likely start to improve after 7-10 days, antibiotics are not helpful in treating viral infections. Since your symptoms have been present for more than 2 days Tamiflu will give no additional benefit.  Please make sure you are drinking plenty of fluids. You can treat your symptoms supportively with tylenol 650 mg/1000mg and ibuprofen 600 mg every 6 hours for fevers and pains. For nasal congestion you can use Zyrtec and Flonase to help with nasal congestion. To treat cough you can use over the counter cough medications such as Mucinex DM or Robitussin and throat lozenges. If your symptoms are not improving please follow up with you Primary doctor.   If you develop persistent fevers, shortness of breath or difficulty breathing, chest pain, severe headache and neck pain, persistent nausea and vomiting or other new or concerning symptoms return to the Emergency department.  

## 2022-07-17 NOTE — ED Notes (Signed)
PA made legal guardian aware of discharge.

## 2022-07-17 NOTE — ED Notes (Signed)
Patient transported to XR. 

## 2022-07-17 NOTE — ED Triage Notes (Addendum)
Per EMS, patient from [facility], flu positive x2 days ago. Facility reports he had a chest XR but no results. States productive cough x1 month. Ambulatory with walker at baseline.  BP 143/73 HR 86 96%  RA CBG 123  Patient ambulatory from stretcher to bed.

## 2022-07-17 NOTE — ED Notes (Signed)
Spoke with patient's son and legal guardian. He is aware patient is in the ED for evaluation.

## 2022-07-17 NOTE — ED Provider Notes (Cosign Needed)
[place] DEPT Provider Note   CSN: [id number] Arrival date & time: 07/17/22  0930     History  Chief Complaint  Patient presents with   Cough    Paul Nap. is a 88 y.o. male with past medical history significant for coronary artery disease, cardiomyopathy, complete heart block with pacemaker, who was recently diagnosed with flu at his rehab facility who reports that he has been having cough, shortness of breath for the last month.  Patient reports that after diagnosing the flu he received a chest x-ray but they did not show him the results.  Patient is in no acute distress on arrival, denies any chest pain, fever, chills.  Reports difficulty breathing, feeling "generally unwell".  He denies nausea, vomiting, diarrhea.   Cough      Home Medications Prior to Admission medications   Medication Sig Start Date End Date Taking? Authorizing Provider  albuterol (VENTOLIN HFA) 108 (90 Base) MCG/ACT inhaler Inhale 1-2 puffs into the lungs every 6 (six) hours as needed for wheezing or shortness of breath. 07/17/22  Yes Patrick Sohm H, PA-C  predniSONE (DELTASONE) 20 MG tablet Take 2 tablets (40 mg total) by mouth daily. 07/17/22  Yes Matti Minney H, PA-C  predniSONE (DELTASONE) 20 MG tablet Take 2 tablets (40 mg total) by mouth daily. 07/17/22  Yes Pierre Dellarocco H, PA-C  atorvastatin (LIPITOR) 40 MG tablet Take 1 tablet daily for Cholesterol Patient taking differently: Take 40 mg by mouth daily. for Cholesterol 10/01/19   Liane Comber, NP  Azelastine HCl 137 MCG/SPRAY SOLN Place into both nostrils. 12/05/20   [provider]  buPROPion (WELLBUTRIN SR) 200 MG 12 hr tablet Take 200 mg by mouth every morning. 12/03/20   [provider]  buPROPion (WELLBUTRIN XL) 300 MG 24 hr tablet Take 1 tablet daily for Mood Patient taking differently: Take 300 mg by mouth daily. for Mood 12/29/19   Unk Pinto, MD  busPIRone  (BUSPAR) 5 MG tablet Take 5 mg by mouth 2 (two) times daily. 11/17/20   [provider]  Cholecalciferol (VITAMIN D) 125 MCG (5000 UT) CAPS Take 5,000 Units by mouth daily.    [provider]  citalopram (CELEXA) 40 MG tablet Take 1 tablet (40 mg total) by mouth daily. 10/08/19 10/07/20  Vicie Mutters R, PA-C  ELIQUIS 5 MG TABS tablet TAKE 1 TABLET BY MOUTH  TWICE DAILY Patient taking differently: Take 5 mg by mouth 2 (two) times daily. 04/07/20   Croitoru, Mihai, MD  gabapentin (NEURONTIN) 300 MG capsule TAKE 1 CAPSULE BY MOUTH 3  TIMES DAILY AS NEEDED FOR  CHRONIC PAIN Patient taking differently: Take 300 mg by mouth 3 (three) times daily as needed (chronic pain). 03/17/20   Liane Comber, NP  losartan (COZAAR) 25 MG tablet Take 0.5 tablets (12.5 mg total) by mouth daily. 10/05/19 03/25/20  Croitoru, Mihai, MD  methocarbamol (ROBAXIN) 500 MG tablet Take by mouth daily as needed. 11/22/20   [provider]  nitroGLYCERIN (NITROSTAT) 0.4 MG SL tablet Place 1 tablet (0.4 mg total) under the tongue every 5 (five) minutes as needed for chest pain. 12/25/19   Unk Pinto, MD  omeprazole (PRILOSEC OTC) 20 MG tablet Take 20 mg by mouth daily as needed (acid reflux).    [provider]  OVER THE COUNTER MEDICATION Take 2 capsules by mouth daily. Omega Q with resveratrol and turmeric    [provider]  potassium chloride SA (KLOR-CON) 20 MEQ tablet  Take 40 mEq by mouth daily.    [provider]  propranolol ER (INDERAL LA) 120 MG 24 hr capsule Take 120 mg by mouth daily. 11/17/20   [provider]      Allergies    Erythromycin, Gabapentin, and Fluorouracil    Review of Systems   Review of Systems  Respiratory:  Positive for cough.   All other systems reviewed and are negative.   Physical Exam Updated Vital Signs BP (!) 120/55   Pulse 61   Temp 99.2 F (37.3 C) (Oral)   Resp 16   SpO2 97%  Physical Exam Vitals and nursing note  reviewed.  Constitutional:      General: He is not in acute distress.    Appearance: Normal appearance.  HENT:     Head: Normocephalic and atraumatic.  Eyes:     General:        Right eye: No discharge.        Left eye: No discharge.  Cardiovascular:     Rate and Rhythm: Normal rate and regular rhythm.     Heart sounds: No murmur heard.    No friction rub. No gallop.  Pulmonary:     Effort: Pulmonary effort is normal.     Breath sounds: Normal breath sounds.     Comments: Coarse rhonchi throughout lung fields, mild wheeze on expiration, no focal consolidation noted, no respiratory distress Abdominal:     General: Bowel sounds are normal.     Palpations: Abdomen is soft.  Skin:    General: Skin is warm and dry.     Capillary Refill: Capillary refill takes less than 2 seconds.  Neurological:     Mental Status: He is alert and oriented to person, place, and time.  Psychiatric:        Mood and Affect: Mood normal.        Behavior: Behavior normal.     ED Results / Procedures / Treatments   Labs (all labs ordered are listed, but only abnormal results are displayed) Labs Reviewed  RESP PANEL BY RT-PCR (RSV, FLU A&B, COVID)  RVPGX2 - Abnormal; Notable for the following components:      Result Value   Influenza A by PCR POSITIVE (*)    All other components within normal limits  CBC - Abnormal; Notable for the following components:   Platelets 118 (*)    All other components within normal limits  BASIC METABOLIC PANEL - Abnormal; Notable for the following components:   Glucose, Bld 109 (*)    Calcium 8.4 (*)    All other components within normal limits  BRAIN NATRIURETIC PEPTIDE - Abnormal; Notable for the following components:   B Natriuretic Peptide 582.0 (*)    All other components within normal limits    EKG EKG Interpretation  Date/Time:  Saturday July 17 2022 09:45:54 EST Ventricular Rate:  71 PR Interval:  233 QRS Duration: 133 QT Interval:  485 QTC  Calculation: 677 R Axis:   99 Text Interpretation: A-V dual-paced complexes w/ some inhibition No further analysis attempted due to paced rhythm Confirmed by Pattricia Boss [phone]) on 07/17/2022 10:02:13 AM  Radiology DG Chest 2 View  Result Date: 07/17/2022 CLINICAL DATA:  Shortness of breath EXAM: CHEST - 2 VIEW COMPARISON:  08/15/2021 FINDINGS: Left-sided implanted cardiac device remains in place. Stable cardiomegaly status post sternotomy and CABG. No focal airspace consolidation, pleural effusion, or pneumothorax. IMPRESSION: No active cardiopulmonary disease. Electronically Signed   By: Hart Carwin  Plundo D.O.   On: 07/17/2022 11:03    Procedures Procedures    Medications Ordered in ED Medications  ipratropium-albuterol (DUONEB) 0.5-2.5 (3) MG/3ML nebulizer solution 3 mL (3 mLs Nebulization Given 07/17/22 1033)  furosemide (LASIX) tablet 40 mg (40 mg Oral Given 07/17/22 1338)    ED Course/ Medical Decision Making/ A&P                           Medical Decision Making Amount and/or Complexity of Data Reviewed Labs: ordered. Radiology: ordered.  Risk Prescription drug management.   This patient is a 88 y.o. male  who presents to the ED for concern of productive cough, concern for flu, son was positive for flu this weekend, patient reports he has had flu symptoms for 2 to 3 days, his facility reports that they could not test him, they performed a chest x-ray but did not inform him of any results.   Differential diagnoses prior to evaluation: The emergent differential diagnosis includes, but is not limited to,  asthma exacerbation, COPD exacerbation, acute upper respiratory infection, acute bronchitis, chronic bronchitis, interstitial lung disease, ARDS, PE, pneumonia, atypical ACS, carbon monoxide poisoning, spontaneous pneumothorax versus other .   This is not an exhaustive differential.   Past Medical History / Co-morbidities: CAD, ischemic cardiomyopathy, complete heart  block with pacemaker, history of TIA  Additional history: Chart reviewed. Pertinent results include: Reviewed outpatient cardiology and pulm internal medicine visits, notably patient does not seem to be taking any kind of  Physical Exam: Physical exam performed. The pertinent findings include: Is very well-appearing on exam, he has a very mild elevated temperature, temp 99.2.  His oxygen saturation is stable on room air.  He does have some coarse breath sounds which are improved after DuoNeb x 1.  No significant crackles at lung bases.  He does not appear overall clinically fluid overloaded, no peripheral edema.  Lab Tests/Imaging studies: I personally interpreted labs/imaging and the pertinent results include: BMP unremarkable, CBC notable for very mild thrombocytopenia, platelets 118.  RVP shows positive for flu A, negative for COVID, RSV.  His BNP is mildly elevated 582.  Discussed with patient that some small degree of fluid overload in addition to acute flu may be the cause of his ongoing cough, will give him single dose of Lasix, discharged on few days of steroid, and inhaler to use as needed, encourage close follow-up with his PCP and cardiologist.   Cardiac monitoring: EKG obtained and interpreted by my attending physician which shows: Paced rhythm, no arrhythmia noted   Medications: I ordered medication including Lasix for mild elevated BNP without overt signs of fluid overload, do not think that patient will need discharge or aggressive diuresis in the emergency department.  DuoNeb for coarse breath sounds which were improved after reevaluation.  I have reviewed the patients home medicines and have made adjustments as needed.   Disposition: After consideration of the diagnostic results and the patients response to treatment, I feel that patient is stable for discharge with some cough, mild shortness of breath as a sequelae of flu, and significant history of cardiac disease without signs  of overt CHF exacerbation, or diagnoses requiring admission.   emergency department workup does not suggest an emergent condition requiring admission or immediate intervention beyond what has been performed at this time. The plan is: as above. The patient is safe for discharge and has been instructed to return immediately for worsening symptoms, change in  symptoms or any other concerns.  Final Clinical Impression(s) / ED Diagnoses Final diagnoses:  Flu  Acute on chronic congestive heart failure, unspecified heart failure type ([place])    Rx / DC Orders ED Discharge Orders          Ordered    predniSONE (DELTASONE) 20 MG tablet  Daily        07/17/22 1332    predniSONE (DELTASONE) 20 MG tablet  Daily        07/17/22 1336    albuterol (VENTOLIN HFA) 108 (90 Base) MCG/ACT inhaler  Every 6 hours PRN        07/17/22 1336              Mahayla Haddaway, [place] H, PA-C 07/17/22 1421

## 2022-07-20 DIAGNOSIS — J111 Influenza due to unidentified influenza virus with other respiratory manifestations: Secondary | ICD-10-CM | POA: Diagnosis not present

## 2022-07-20 DIAGNOSIS — R062 Wheezing: Secondary | ICD-10-CM | POA: Diagnosis not present

## 2022-07-20 DIAGNOSIS — Z0189 Encounter for other specified special examinations: Secondary | ICD-10-CM | POA: Diagnosis not present

## 2022-07-22 DIAGNOSIS — R41841 Cognitive communication deficit: Secondary | ICD-10-CM | POA: Diagnosis not present

## 2022-07-22 DIAGNOSIS — R269 Unspecified abnormalities of gait and mobility: Secondary | ICD-10-CM | POA: Diagnosis not present

## 2022-07-22 DIAGNOSIS — R2689 Other abnormalities of gait and mobility: Secondary | ICD-10-CM | POA: Diagnosis not present

## 2022-07-22 DIAGNOSIS — R498 Other voice and resonance disorders: Secondary | ICD-10-CM | POA: Diagnosis not present

## 2022-07-22 DIAGNOSIS — R2681 Unsteadiness on feet: Secondary | ICD-10-CM | POA: Diagnosis not present

## 2022-07-22 DIAGNOSIS — J1282 Pneumonia due to coronavirus disease 2019: Secondary | ICD-10-CM | POA: Diagnosis not present

## 2022-07-22 DIAGNOSIS — M6258 Muscle wasting and atrophy, not elsewhere classified, other site: Secondary | ICD-10-CM | POA: Diagnosis not present

## 2022-07-22 DIAGNOSIS — R278 Other lack of coordination: Secondary | ICD-10-CM | POA: Diagnosis not present

## 2022-07-22 DIAGNOSIS — M6281 Muscle weakness (generalized): Secondary | ICD-10-CM | POA: Diagnosis not present

## 2022-07-23 DIAGNOSIS — R41841 Cognitive communication deficit: Secondary | ICD-10-CM | POA: Diagnosis not present

## 2022-07-23 DIAGNOSIS — M6258 Muscle wasting and atrophy, not elsewhere classified, other site: Secondary | ICD-10-CM | POA: Diagnosis not present

## 2022-07-23 DIAGNOSIS — R278 Other lack of coordination: Secondary | ICD-10-CM | POA: Diagnosis not present

## 2022-07-23 DIAGNOSIS — R2689 Other abnormalities of gait and mobility: Secondary | ICD-10-CM | POA: Diagnosis not present

## 2022-07-23 DIAGNOSIS — R498 Other voice and resonance disorders: Secondary | ICD-10-CM | POA: Diagnosis not present

## 2022-07-23 DIAGNOSIS — R269 Unspecified abnormalities of gait and mobility: Secondary | ICD-10-CM | POA: Diagnosis not present

## 2022-07-23 DIAGNOSIS — R2681 Unsteadiness on feet: Secondary | ICD-10-CM | POA: Diagnosis not present

## 2022-07-23 DIAGNOSIS — M6281 Muscle weakness (generalized): Secondary | ICD-10-CM | POA: Diagnosis not present

## 2022-07-23 DIAGNOSIS — J1282 Pneumonia due to coronavirus disease 2019: Secondary | ICD-10-CM | POA: Diagnosis not present

## 2022-07-26 DIAGNOSIS — R278 Other lack of coordination: Secondary | ICD-10-CM | POA: Diagnosis not present

## 2022-07-26 DIAGNOSIS — J1282 Pneumonia due to coronavirus disease 2019: Secondary | ICD-10-CM | POA: Diagnosis not present

## 2022-07-26 DIAGNOSIS — M6281 Muscle weakness (generalized): Secondary | ICD-10-CM | POA: Diagnosis not present

## 2022-07-26 DIAGNOSIS — R269 Unspecified abnormalities of gait and mobility: Secondary | ICD-10-CM | POA: Diagnosis not present

## 2022-07-26 DIAGNOSIS — R2681 Unsteadiness on feet: Secondary | ICD-10-CM | POA: Diagnosis not present

## 2022-07-26 DIAGNOSIS — R41841 Cognitive communication deficit: Secondary | ICD-10-CM | POA: Diagnosis not present

## 2022-07-26 DIAGNOSIS — R498 Other voice and resonance disorders: Secondary | ICD-10-CM | POA: Diagnosis not present

## 2022-07-26 DIAGNOSIS — R2689 Other abnormalities of gait and mobility: Secondary | ICD-10-CM | POA: Diagnosis not present

## 2022-07-26 DIAGNOSIS — M6258 Muscle wasting and atrophy, not elsewhere classified, other site: Secondary | ICD-10-CM | POA: Diagnosis not present

## 2022-07-27 DIAGNOSIS — M6258 Muscle wasting and atrophy, not elsewhere classified, other site: Secondary | ICD-10-CM | POA: Diagnosis not present

## 2022-07-27 DIAGNOSIS — R2681 Unsteadiness on feet: Secondary | ICD-10-CM | POA: Diagnosis not present

## 2022-07-27 DIAGNOSIS — I700 Atherosclerosis of aorta: Secondary | ICD-10-CM | POA: Diagnosis not present

## 2022-07-27 DIAGNOSIS — M6281 Muscle weakness (generalized): Secondary | ICD-10-CM | POA: Diagnosis not present

## 2022-07-27 DIAGNOSIS — R278 Other lack of coordination: Secondary | ICD-10-CM | POA: Diagnosis not present

## 2022-07-27 DIAGNOSIS — I4892 Unspecified atrial flutter: Secondary | ICD-10-CM | POA: Diagnosis not present

## 2022-07-27 DIAGNOSIS — R2689 Other abnormalities of gait and mobility: Secondary | ICD-10-CM | POA: Diagnosis not present

## 2022-07-27 DIAGNOSIS — R41841 Cognitive communication deficit: Secondary | ICD-10-CM | POA: Diagnosis not present

## 2022-07-27 DIAGNOSIS — R498 Other voice and resonance disorders: Secondary | ICD-10-CM | POA: Diagnosis not present

## 2022-07-27 DIAGNOSIS — G250 Essential tremor: Secondary | ICD-10-CM | POA: Diagnosis not present

## 2022-07-27 DIAGNOSIS — R269 Unspecified abnormalities of gait and mobility: Secondary | ICD-10-CM | POA: Diagnosis not present

## 2022-07-27 DIAGNOSIS — J1282 Pneumonia due to coronavirus disease 2019: Secondary | ICD-10-CM | POA: Diagnosis not present

## 2022-07-27 DIAGNOSIS — I110 Hypertensive heart disease with heart failure: Secondary | ICD-10-CM | POA: Diagnosis not present

## 2022-07-28 DIAGNOSIS — R2681 Unsteadiness on feet: Secondary | ICD-10-CM | POA: Diagnosis not present

## 2022-07-28 DIAGNOSIS — R498 Other voice and resonance disorders: Secondary | ICD-10-CM | POA: Diagnosis not present

## 2022-07-28 DIAGNOSIS — R41841 Cognitive communication deficit: Secondary | ICD-10-CM | POA: Diagnosis not present

## 2022-07-28 DIAGNOSIS — R269 Unspecified abnormalities of gait and mobility: Secondary | ICD-10-CM | POA: Diagnosis not present

## 2022-07-28 DIAGNOSIS — M6258 Muscle wasting and atrophy, not elsewhere classified, other site: Secondary | ICD-10-CM | POA: Diagnosis not present

## 2022-07-28 DIAGNOSIS — R278 Other lack of coordination: Secondary | ICD-10-CM | POA: Diagnosis not present

## 2022-07-28 DIAGNOSIS — R2689 Other abnormalities of gait and mobility: Secondary | ICD-10-CM | POA: Diagnosis not present

## 2022-07-28 DIAGNOSIS — J1282 Pneumonia due to coronavirus disease 2019: Secondary | ICD-10-CM | POA: Diagnosis not present

## 2022-07-28 DIAGNOSIS — M6281 Muscle weakness (generalized): Secondary | ICD-10-CM | POA: Diagnosis not present

## 2022-07-29 NOTE — Progress Notes (Signed)
Remote pacemaker transmission.   

## 2022-07-30 DIAGNOSIS — M6258 Muscle wasting and atrophy, not elsewhere classified, other site: Secondary | ICD-10-CM | POA: Diagnosis not present

## 2022-07-30 DIAGNOSIS — R278 Other lack of coordination: Secondary | ICD-10-CM | POA: Diagnosis not present

## 2022-07-30 DIAGNOSIS — J1282 Pneumonia due to coronavirus disease 2019: Secondary | ICD-10-CM | POA: Diagnosis not present

## 2022-07-30 DIAGNOSIS — R41841 Cognitive communication deficit: Secondary | ICD-10-CM | POA: Diagnosis not present

## 2022-07-30 DIAGNOSIS — M6281 Muscle weakness (generalized): Secondary | ICD-10-CM | POA: Diagnosis not present

## 2022-07-30 DIAGNOSIS — F1320 Sedative, hypnotic or anxiolytic dependence, uncomplicated: Secondary | ICD-10-CM | POA: Diagnosis not present

## 2022-07-30 DIAGNOSIS — R2681 Unsteadiness on feet: Secondary | ICD-10-CM | POA: Diagnosis not present

## 2022-07-30 DIAGNOSIS — R498 Other voice and resonance disorders: Secondary | ICD-10-CM | POA: Diagnosis not present

## 2022-07-30 DIAGNOSIS — G250 Essential tremor: Secondary | ICD-10-CM | POA: Diagnosis not present

## 2022-07-30 DIAGNOSIS — R2689 Other abnormalities of gait and mobility: Secondary | ICD-10-CM | POA: Diagnosis not present

## 2022-07-30 DIAGNOSIS — F411 Generalized anxiety disorder: Secondary | ICD-10-CM | POA: Diagnosis not present

## 2022-07-30 DIAGNOSIS — R269 Unspecified abnormalities of gait and mobility: Secondary | ICD-10-CM | POA: Diagnosis not present

## 2022-08-02 DIAGNOSIS — F331 Major depressive disorder, recurrent, moderate: Secondary | ICD-10-CM | POA: Diagnosis not present

## 2022-08-02 DIAGNOSIS — R278 Other lack of coordination: Secondary | ICD-10-CM | POA: Diagnosis not present

## 2022-08-02 DIAGNOSIS — F411 Generalized anxiety disorder: Secondary | ICD-10-CM | POA: Diagnosis not present

## 2022-08-02 DIAGNOSIS — J1282 Pneumonia due to coronavirus disease 2019: Secondary | ICD-10-CM | POA: Diagnosis not present

## 2022-08-02 DIAGNOSIS — M6281 Muscle weakness (generalized): Secondary | ICD-10-CM | POA: Diagnosis not present

## 2022-08-02 DIAGNOSIS — R41841 Cognitive communication deficit: Secondary | ICD-10-CM | POA: Diagnosis not present

## 2022-08-02 DIAGNOSIS — R269 Unspecified abnormalities of gait and mobility: Secondary | ICD-10-CM | POA: Diagnosis not present

## 2022-08-02 DIAGNOSIS — R2681 Unsteadiness on feet: Secondary | ICD-10-CM | POA: Diagnosis not present

## 2022-08-02 DIAGNOSIS — R498 Other voice and resonance disorders: Secondary | ICD-10-CM | POA: Diagnosis not present

## 2022-08-02 DIAGNOSIS — R2689 Other abnormalities of gait and mobility: Secondary | ICD-10-CM | POA: Diagnosis not present

## 2022-08-02 DIAGNOSIS — M6258 Muscle wasting and atrophy, not elsewhere classified, other site: Secondary | ICD-10-CM | POA: Diagnosis not present

## 2022-08-03 DIAGNOSIS — R41841 Cognitive communication deficit: Secondary | ICD-10-CM | POA: Diagnosis not present

## 2022-08-03 DIAGNOSIS — J1282 Pneumonia due to coronavirus disease 2019: Secondary | ICD-10-CM | POA: Diagnosis not present

## 2022-08-03 DIAGNOSIS — R269 Unspecified abnormalities of gait and mobility: Secondary | ICD-10-CM | POA: Diagnosis not present

## 2022-08-03 DIAGNOSIS — R278 Other lack of coordination: Secondary | ICD-10-CM | POA: Diagnosis not present

## 2022-08-03 DIAGNOSIS — R2689 Other abnormalities of gait and mobility: Secondary | ICD-10-CM | POA: Diagnosis not present

## 2022-08-03 DIAGNOSIS — R2681 Unsteadiness on feet: Secondary | ICD-10-CM | POA: Diagnosis not present

## 2022-08-03 DIAGNOSIS — R498 Other voice and resonance disorders: Secondary | ICD-10-CM | POA: Diagnosis not present

## 2022-08-03 DIAGNOSIS — M6281 Muscle weakness (generalized): Secondary | ICD-10-CM | POA: Diagnosis not present

## 2022-08-03 DIAGNOSIS — M6258 Muscle wasting and atrophy, not elsewhere classified, other site: Secondary | ICD-10-CM | POA: Diagnosis not present

## 2022-08-04 DIAGNOSIS — R2681 Unsteadiness on feet: Secondary | ICD-10-CM | POA: Diagnosis not present

## 2022-08-04 DIAGNOSIS — J1282 Pneumonia due to coronavirus disease 2019: Secondary | ICD-10-CM | POA: Diagnosis not present

## 2022-08-04 DIAGNOSIS — R278 Other lack of coordination: Secondary | ICD-10-CM | POA: Diagnosis not present

## 2022-08-04 DIAGNOSIS — M6258 Muscle wasting and atrophy, not elsewhere classified, other site: Secondary | ICD-10-CM | POA: Diagnosis not present

## 2022-08-04 DIAGNOSIS — M6281 Muscle weakness (generalized): Secondary | ICD-10-CM | POA: Diagnosis not present

## 2022-08-04 DIAGNOSIS — R2689 Other abnormalities of gait and mobility: Secondary | ICD-10-CM | POA: Diagnosis not present

## 2022-08-04 DIAGNOSIS — R269 Unspecified abnormalities of gait and mobility: Secondary | ICD-10-CM | POA: Diagnosis not present

## 2022-08-04 DIAGNOSIS — R498 Other voice and resonance disorders: Secondary | ICD-10-CM | POA: Diagnosis not present

## 2022-08-04 DIAGNOSIS — R41841 Cognitive communication deficit: Secondary | ICD-10-CM | POA: Diagnosis not present

## 2022-08-13 DIAGNOSIS — S51819A Laceration without foreign body of unspecified forearm, initial encounter: Secondary | ICD-10-CM | POA: Diagnosis not present

## 2022-08-13 DIAGNOSIS — R600 Localized edema: Secondary | ICD-10-CM | POA: Diagnosis not present

## 2022-08-24 DIAGNOSIS — M79601 Pain in right arm: Secondary | ICD-10-CM | POA: Diagnosis not present

## 2022-08-24 DIAGNOSIS — R600 Localized edema: Secondary | ICD-10-CM | POA: Diagnosis not present

## 2022-08-24 DIAGNOSIS — R2231 Localized swelling, mass and lump, right upper limb: Secondary | ICD-10-CM | POA: Diagnosis not present

## 2022-09-07 DIAGNOSIS — M6281 Muscle weakness (generalized): Secondary | ICD-10-CM | POA: Diagnosis not present

## 2022-09-07 DIAGNOSIS — R2689 Other abnormalities of gait and mobility: Secondary | ICD-10-CM | POA: Diagnosis not present

## 2022-09-07 DIAGNOSIS — M6258 Muscle wasting and atrophy, not elsewhere classified, other site: Secondary | ICD-10-CM | POA: Diagnosis not present

## 2022-09-09 DIAGNOSIS — R2689 Other abnormalities of gait and mobility: Secondary | ICD-10-CM | POA: Diagnosis not present

## 2022-09-09 DIAGNOSIS — M6258 Muscle wasting and atrophy, not elsewhere classified, other site: Secondary | ICD-10-CM | POA: Diagnosis not present

## 2022-09-09 DIAGNOSIS — M6281 Muscle weakness (generalized): Secondary | ICD-10-CM | POA: Diagnosis not present

## 2022-09-10 DIAGNOSIS — M6258 Muscle wasting and atrophy, not elsewhere classified, other site: Secondary | ICD-10-CM | POA: Diagnosis not present

## 2022-09-10 DIAGNOSIS — R2689 Other abnormalities of gait and mobility: Secondary | ICD-10-CM | POA: Diagnosis not present

## 2022-09-10 DIAGNOSIS — M6281 Muscle weakness (generalized): Secondary | ICD-10-CM | POA: Diagnosis not present

## 2022-09-13 DIAGNOSIS — R2689 Other abnormalities of gait and mobility: Secondary | ICD-10-CM | POA: Diagnosis not present

## 2022-09-13 DIAGNOSIS — M6281 Muscle weakness (generalized): Secondary | ICD-10-CM | POA: Diagnosis not present

## 2022-09-13 DIAGNOSIS — M6258 Muscle wasting and atrophy, not elsewhere classified, other site: Secondary | ICD-10-CM | POA: Diagnosis not present

## 2022-09-14 DIAGNOSIS — M6281 Muscle weakness (generalized): Secondary | ICD-10-CM | POA: Diagnosis not present

## 2022-09-14 DIAGNOSIS — R2689 Other abnormalities of gait and mobility: Secondary | ICD-10-CM | POA: Diagnosis not present

## 2022-09-14 DIAGNOSIS — M6258 Muscle wasting and atrophy, not elsewhere classified, other site: Secondary | ICD-10-CM | POA: Diagnosis not present

## 2022-09-15 DIAGNOSIS — R2689 Other abnormalities of gait and mobility: Secondary | ICD-10-CM | POA: Diagnosis not present

## 2022-09-15 DIAGNOSIS — M6258 Muscle wasting and atrophy, not elsewhere classified, other site: Secondary | ICD-10-CM | POA: Diagnosis not present

## 2022-09-15 DIAGNOSIS — M6281 Muscle weakness (generalized): Secondary | ICD-10-CM | POA: Diagnosis not present

## 2022-09-16 DIAGNOSIS — I5040 Unspecified combined systolic (congestive) and diastolic (congestive) heart failure: Secondary | ICD-10-CM | POA: Diagnosis not present

## 2022-09-16 DIAGNOSIS — I480 Paroxysmal atrial fibrillation: Secondary | ICD-10-CM | POA: Diagnosis not present

## 2022-09-16 DIAGNOSIS — I2510 Atherosclerotic heart disease of native coronary artery without angina pectoris: Secondary | ICD-10-CM | POA: Diagnosis not present

## 2022-09-16 DIAGNOSIS — I110 Hypertensive heart disease with heart failure: Secondary | ICD-10-CM | POA: Diagnosis not present

## 2022-09-28 ENCOUNTER — Ambulatory Visit (INDEPENDENT_AMBULATORY_CARE_PROVIDER_SITE_OTHER): Payer: Medicare Other

## 2022-09-28 DIAGNOSIS — I509 Heart failure, unspecified: Secondary | ICD-10-CM | POA: Diagnosis not present

## 2022-09-28 DIAGNOSIS — F32A Depression, unspecified: Secondary | ICD-10-CM | POA: Diagnosis not present

## 2022-09-28 DIAGNOSIS — I4892 Unspecified atrial flutter: Secondary | ICD-10-CM | POA: Diagnosis not present

## 2022-09-28 DIAGNOSIS — F419 Anxiety disorder, unspecified: Secondary | ICD-10-CM | POA: Diagnosis not present

## 2022-09-28 DIAGNOSIS — I442 Atrioventricular block, complete: Secondary | ICD-10-CM | POA: Diagnosis not present

## 2022-09-30 LAB — CUP PACEART REMOTE DEVICE CHECK
Battery Remaining Longevity: 24 mo
Battery Voltage: 2.91 V
Brady Statistic AP VP Percent: 94.59 %
Brady Statistic AP VS Percent: 0.14 %
Brady Statistic AS VP Percent: 3.41 %
Brady Statistic AS VS Percent: 1.86 %
Brady Statistic RA Percent Paced: 96.34 %
Brady Statistic RV Percent Paced: 98 %
Date Time Interrogation Session: 20240311224515
Implantable Lead Connection Status: 753985
Implantable Lead Connection Status: 753985
Implantable Lead Implant Date: 20190822
Implantable Lead Implant Date: 20190822
Implantable Lead Location: 753859
Implantable Lead Location: 753860
Implantable Lead Model: 3830
Implantable Lead Model: 5076
Implantable Pulse Generator Implant Date: 20190822
Lead Channel Impedance Value: 304 Ohm
Lead Channel Impedance Value: 304 Ohm
Lead Channel Impedance Value: 361 Ohm
Lead Channel Impedance Value: 418 Ohm
Lead Channel Pacing Threshold Amplitude: 0.75 V
Lead Channel Pacing Threshold Amplitude: 1.375 V
Lead Channel Pacing Threshold Pulse Width: 0.4 ms
Lead Channel Pacing Threshold Pulse Width: 0.4 ms
Lead Channel Sensing Intrinsic Amplitude: 5.5 mV
Lead Channel Sensing Intrinsic Amplitude: 5.5 mV
Lead Channel Sensing Intrinsic Amplitude: 5.875 mV
Lead Channel Sensing Intrinsic Amplitude: 5.875 mV
Lead Channel Setting Pacing Amplitude: 1.5 V
Lead Channel Setting Pacing Amplitude: 2.5 V
Lead Channel Setting Pacing Pulse Width: 1 ms
Lead Channel Setting Sensing Sensitivity: 2.8 mV
Zone Setting Status: 755011
Zone Setting Status: 755011

## 2022-10-11 DIAGNOSIS — F4321 Adjustment disorder with depressed mood: Secondary | ICD-10-CM | POA: Diagnosis not present

## 2022-10-11 DIAGNOSIS — F331 Major depressive disorder, recurrent, moderate: Secondary | ICD-10-CM | POA: Diagnosis not present

## 2022-10-11 DIAGNOSIS — F411 Generalized anxiety disorder: Secondary | ICD-10-CM | POA: Diagnosis not present

## 2022-10-21 DIAGNOSIS — M545 Low back pain, unspecified: Secondary | ICD-10-CM | POA: Diagnosis not present

## 2022-10-21 DIAGNOSIS — M6281 Muscle weakness (generalized): Secondary | ICD-10-CM | POA: Diagnosis not present

## 2022-10-22 DIAGNOSIS — M6281 Muscle weakness (generalized): Secondary | ICD-10-CM | POA: Diagnosis not present

## 2022-10-22 DIAGNOSIS — M545 Low back pain, unspecified: Secondary | ICD-10-CM | POA: Diagnosis not present

## 2022-10-25 DIAGNOSIS — R972 Elevated prostate specific antigen [PSA]: Secondary | ICD-10-CM | POA: Diagnosis not present

## 2022-10-25 DIAGNOSIS — N403 Nodular prostate with lower urinary tract symptoms: Secondary | ICD-10-CM | POA: Diagnosis not present

## 2022-10-25 DIAGNOSIS — M6281 Muscle weakness (generalized): Secondary | ICD-10-CM | POA: Diagnosis not present

## 2022-10-25 DIAGNOSIS — R350 Frequency of micturition: Secondary | ICD-10-CM | POA: Diagnosis not present

## 2022-10-25 DIAGNOSIS — N200 Calculus of kidney: Secondary | ICD-10-CM | POA: Diagnosis not present

## 2022-10-25 DIAGNOSIS — M545 Low back pain, unspecified: Secondary | ICD-10-CM | POA: Diagnosis not present

## 2022-10-26 DIAGNOSIS — M6281 Muscle weakness (generalized): Secondary | ICD-10-CM | POA: Diagnosis not present

## 2022-10-26 DIAGNOSIS — M545 Low back pain, unspecified: Secondary | ICD-10-CM | POA: Diagnosis not present

## 2022-10-27 DIAGNOSIS — M6281 Muscle weakness (generalized): Secondary | ICD-10-CM | POA: Diagnosis not present

## 2022-10-27 DIAGNOSIS — M545 Low back pain, unspecified: Secondary | ICD-10-CM | POA: Diagnosis not present

## 2022-10-28 DIAGNOSIS — M545 Low back pain, unspecified: Secondary | ICD-10-CM | POA: Diagnosis not present

## 2022-10-28 DIAGNOSIS — M6281 Muscle weakness (generalized): Secondary | ICD-10-CM | POA: Diagnosis not present

## 2022-10-29 DIAGNOSIS — M545 Low back pain, unspecified: Secondary | ICD-10-CM | POA: Diagnosis not present

## 2022-10-29 DIAGNOSIS — M6281 Muscle weakness (generalized): Secondary | ICD-10-CM | POA: Diagnosis not present

## 2022-11-02 DIAGNOSIS — M6281 Muscle weakness (generalized): Secondary | ICD-10-CM | POA: Diagnosis not present

## 2022-11-02 DIAGNOSIS — M545 Low back pain, unspecified: Secondary | ICD-10-CM | POA: Diagnosis not present

## 2022-11-04 DIAGNOSIS — M545 Low back pain, unspecified: Secondary | ICD-10-CM | POA: Diagnosis not present

## 2022-11-04 DIAGNOSIS — M6281 Muscle weakness (generalized): Secondary | ICD-10-CM | POA: Diagnosis not present

## 2022-11-05 DIAGNOSIS — M6281 Muscle weakness (generalized): Secondary | ICD-10-CM | POA: Diagnosis not present

## 2022-11-05 DIAGNOSIS — M545 Low back pain, unspecified: Secondary | ICD-10-CM | POA: Diagnosis not present

## 2022-11-08 DIAGNOSIS — M6281 Muscle weakness (generalized): Secondary | ICD-10-CM | POA: Diagnosis not present

## 2022-11-08 DIAGNOSIS — M545 Low back pain, unspecified: Secondary | ICD-10-CM | POA: Diagnosis not present

## 2022-11-09 DIAGNOSIS — M545 Low back pain, unspecified: Secondary | ICD-10-CM | POA: Diagnosis not present

## 2022-11-09 DIAGNOSIS — M6281 Muscle weakness (generalized): Secondary | ICD-10-CM | POA: Diagnosis not present

## 2022-11-09 NOTE — Progress Notes (Signed)
Remote pacemaker transmission.   

## 2022-11-11 DIAGNOSIS — M545 Low back pain, unspecified: Secondary | ICD-10-CM | POA: Diagnosis not present

## 2022-11-11 DIAGNOSIS — M6281 Muscle weakness (generalized): Secondary | ICD-10-CM | POA: Diagnosis not present

## 2022-11-12 DIAGNOSIS — H6123 Impacted cerumen, bilateral: Secondary | ICD-10-CM | POA: Diagnosis not present

## 2022-11-12 DIAGNOSIS — M545 Low back pain, unspecified: Secondary | ICD-10-CM | POA: Diagnosis not present

## 2022-11-12 DIAGNOSIS — F32A Depression, unspecified: Secondary | ICD-10-CM | POA: Diagnosis not present

## 2022-11-12 DIAGNOSIS — G250 Essential tremor: Secondary | ICD-10-CM | POA: Diagnosis not present

## 2022-11-12 DIAGNOSIS — R262 Difficulty in walking, not elsewhere classified: Secondary | ICD-10-CM | POA: Diagnosis not present

## 2022-11-12 DIAGNOSIS — M6281 Muscle weakness (generalized): Secondary | ICD-10-CM | POA: Diagnosis not present

## 2022-11-15 DIAGNOSIS — M6281 Muscle weakness (generalized): Secondary | ICD-10-CM | POA: Diagnosis not present

## 2022-11-15 DIAGNOSIS — M545 Low back pain, unspecified: Secondary | ICD-10-CM | POA: Diagnosis not present

## 2022-11-16 DIAGNOSIS — I480 Paroxysmal atrial fibrillation: Secondary | ICD-10-CM | POA: Diagnosis not present

## 2022-11-16 DIAGNOSIS — I5040 Unspecified combined systolic (congestive) and diastolic (congestive) heart failure: Secondary | ICD-10-CM | POA: Diagnosis not present

## 2022-11-16 DIAGNOSIS — M545 Low back pain, unspecified: Secondary | ICD-10-CM | POA: Diagnosis not present

## 2022-11-16 DIAGNOSIS — E785 Hyperlipidemia, unspecified: Secondary | ICD-10-CM | POA: Diagnosis not present

## 2022-11-16 DIAGNOSIS — M6281 Muscle weakness (generalized): Secondary | ICD-10-CM | POA: Diagnosis not present

## 2022-11-16 DIAGNOSIS — I110 Hypertensive heart disease with heart failure: Secondary | ICD-10-CM | POA: Diagnosis not present

## 2022-11-17 DIAGNOSIS — M545 Low back pain, unspecified: Secondary | ICD-10-CM | POA: Diagnosis not present

## 2022-11-17 DIAGNOSIS — M6281 Muscle weakness (generalized): Secondary | ICD-10-CM | POA: Diagnosis not present

## 2022-11-18 DIAGNOSIS — M545 Low back pain, unspecified: Secondary | ICD-10-CM | POA: Diagnosis not present

## 2022-11-18 DIAGNOSIS — R748 Abnormal levels of other serum enzymes: Secondary | ICD-10-CM | POA: Diagnosis not present

## 2022-11-18 DIAGNOSIS — M6281 Muscle weakness (generalized): Secondary | ICD-10-CM | POA: Diagnosis not present

## 2022-11-18 DIAGNOSIS — E559 Vitamin D deficiency, unspecified: Secondary | ICD-10-CM | POA: Diagnosis not present

## 2022-11-18 DIAGNOSIS — I10 Essential (primary) hypertension: Secondary | ICD-10-CM | POA: Diagnosis not present

## 2022-11-19 DIAGNOSIS — H6123 Impacted cerumen, bilateral: Secondary | ICD-10-CM | POA: Diagnosis not present

## 2022-11-19 DIAGNOSIS — E785 Hyperlipidemia, unspecified: Secondary | ICD-10-CM | POA: Diagnosis not present

## 2022-11-19 DIAGNOSIS — D649 Anemia, unspecified: Secondary | ICD-10-CM | POA: Diagnosis not present

## 2022-11-22 DIAGNOSIS — M6281 Muscle weakness (generalized): Secondary | ICD-10-CM | POA: Diagnosis not present

## 2022-11-22 DIAGNOSIS — M545 Low back pain, unspecified: Secondary | ICD-10-CM | POA: Diagnosis not present

## 2022-11-23 DIAGNOSIS — M545 Low back pain, unspecified: Secondary | ICD-10-CM | POA: Diagnosis not present

## 2022-11-23 DIAGNOSIS — I480 Paroxysmal atrial fibrillation: Secondary | ICD-10-CM | POA: Diagnosis not present

## 2022-11-23 DIAGNOSIS — M6281 Muscle weakness (generalized): Secondary | ICD-10-CM | POA: Diagnosis not present

## 2022-11-23 DIAGNOSIS — I110 Hypertensive heart disease with heart failure: Secondary | ICD-10-CM | POA: Diagnosis not present

## 2022-11-23 DIAGNOSIS — L918 Other hypertrophic disorders of the skin: Secondary | ICD-10-CM | POA: Diagnosis not present

## 2022-11-23 DIAGNOSIS — E785 Hyperlipidemia, unspecified: Secondary | ICD-10-CM | POA: Diagnosis not present

## 2022-11-24 DIAGNOSIS — M6281 Muscle weakness (generalized): Secondary | ICD-10-CM | POA: Diagnosis not present

## 2022-11-24 DIAGNOSIS — M545 Low back pain, unspecified: Secondary | ICD-10-CM | POA: Diagnosis not present

## 2022-11-25 DIAGNOSIS — M6281 Muscle weakness (generalized): Secondary | ICD-10-CM | POA: Diagnosis not present

## 2022-11-25 DIAGNOSIS — M545 Low back pain, unspecified: Secondary | ICD-10-CM | POA: Diagnosis not present

## 2022-11-26 DIAGNOSIS — M545 Low back pain, unspecified: Secondary | ICD-10-CM | POA: Diagnosis not present

## 2022-11-26 DIAGNOSIS — M6281 Muscle weakness (generalized): Secondary | ICD-10-CM | POA: Diagnosis not present

## 2022-11-26 DIAGNOSIS — Z7189 Other specified counseling: Secondary | ICD-10-CM | POA: Diagnosis not present

## 2022-11-26 DIAGNOSIS — H938X3 Other specified disorders of ear, bilateral: Secondary | ICD-10-CM | POA: Diagnosis not present

## 2022-11-26 DIAGNOSIS — H9190 Unspecified hearing loss, unspecified ear: Secondary | ICD-10-CM | POA: Diagnosis not present

## 2022-11-26 DIAGNOSIS — H6123 Impacted cerumen, bilateral: Secondary | ICD-10-CM | POA: Diagnosis not present

## 2022-11-30 DIAGNOSIS — M6281 Muscle weakness (generalized): Secondary | ICD-10-CM | POA: Diagnosis not present

## 2022-11-30 DIAGNOSIS — M545 Low back pain, unspecified: Secondary | ICD-10-CM | POA: Diagnosis not present

## 2022-12-01 DIAGNOSIS — M6281 Muscle weakness (generalized): Secondary | ICD-10-CM | POA: Diagnosis not present

## 2022-12-01 DIAGNOSIS — E785 Hyperlipidemia, unspecified: Secondary | ICD-10-CM | POA: Diagnosis not present

## 2022-12-01 DIAGNOSIS — M545 Low back pain, unspecified: Secondary | ICD-10-CM | POA: Diagnosis not present

## 2022-12-01 DIAGNOSIS — J029 Acute pharyngitis, unspecified: Secondary | ICD-10-CM | POA: Diagnosis not present

## 2022-12-01 DIAGNOSIS — F411 Generalized anxiety disorder: Secondary | ICD-10-CM | POA: Diagnosis not present

## 2022-12-01 DIAGNOSIS — Z9189 Other specified personal risk factors, not elsewhere classified: Secondary | ICD-10-CM | POA: Diagnosis not present

## 2022-12-02 DIAGNOSIS — L538 Other specified erythematous conditions: Secondary | ICD-10-CM | POA: Diagnosis not present

## 2022-12-02 DIAGNOSIS — L298 Other pruritus: Secondary | ICD-10-CM | POA: Diagnosis not present

## 2022-12-02 DIAGNOSIS — Z789 Other specified health status: Secondary | ICD-10-CM | POA: Diagnosis not present

## 2022-12-02 DIAGNOSIS — D1801 Hemangioma of skin and subcutaneous tissue: Secondary | ICD-10-CM | POA: Diagnosis not present

## 2022-12-02 DIAGNOSIS — L814 Other melanin hyperpigmentation: Secondary | ICD-10-CM | POA: Diagnosis not present

## 2022-12-02 DIAGNOSIS — L820 Inflamed seborrheic keratosis: Secondary | ICD-10-CM | POA: Diagnosis not present

## 2022-12-02 DIAGNOSIS — L821 Other seborrheic keratosis: Secondary | ICD-10-CM | POA: Diagnosis not present

## 2022-12-02 DIAGNOSIS — M545 Low back pain, unspecified: Secondary | ICD-10-CM | POA: Diagnosis not present

## 2022-12-02 DIAGNOSIS — M6281 Muscle weakness (generalized): Secondary | ICD-10-CM | POA: Diagnosis not present

## 2022-12-03 DIAGNOSIS — M6281 Muscle weakness (generalized): Secondary | ICD-10-CM | POA: Diagnosis not present

## 2022-12-03 DIAGNOSIS — M545 Low back pain, unspecified: Secondary | ICD-10-CM | POA: Diagnosis not present

## 2022-12-06 DIAGNOSIS — M6281 Muscle weakness (generalized): Secondary | ICD-10-CM | POA: Diagnosis not present

## 2022-12-06 DIAGNOSIS — M545 Low back pain, unspecified: Secondary | ICD-10-CM | POA: Diagnosis not present

## 2022-12-07 DIAGNOSIS — M6281 Muscle weakness (generalized): Secondary | ICD-10-CM | POA: Diagnosis not present

## 2022-12-07 DIAGNOSIS — M545 Low back pain, unspecified: Secondary | ICD-10-CM | POA: Diagnosis not present

## 2022-12-08 DIAGNOSIS — M545 Low back pain, unspecified: Secondary | ICD-10-CM | POA: Diagnosis not present

## 2022-12-08 DIAGNOSIS — M6281 Muscle weakness (generalized): Secondary | ICD-10-CM | POA: Diagnosis not present

## 2022-12-09 DIAGNOSIS — M545 Low back pain, unspecified: Secondary | ICD-10-CM | POA: Diagnosis not present

## 2022-12-09 DIAGNOSIS — M6281 Muscle weakness (generalized): Secondary | ICD-10-CM | POA: Diagnosis not present

## 2022-12-10 DIAGNOSIS — M6281 Muscle weakness (generalized): Secondary | ICD-10-CM | POA: Diagnosis not present

## 2022-12-10 DIAGNOSIS — M545 Low back pain, unspecified: Secondary | ICD-10-CM | POA: Diagnosis not present

## 2022-12-13 DIAGNOSIS — M545 Low back pain, unspecified: Secondary | ICD-10-CM | POA: Diagnosis not present

## 2022-12-13 DIAGNOSIS — M6281 Muscle weakness (generalized): Secondary | ICD-10-CM | POA: Diagnosis not present

## 2022-12-14 DIAGNOSIS — M545 Low back pain, unspecified: Secondary | ICD-10-CM | POA: Diagnosis not present

## 2022-12-14 DIAGNOSIS — M6281 Muscle weakness (generalized): Secondary | ICD-10-CM | POA: Diagnosis not present

## 2022-12-15 DIAGNOSIS — M6281 Muscle weakness (generalized): Secondary | ICD-10-CM | POA: Diagnosis not present

## 2022-12-15 DIAGNOSIS — M545 Low back pain, unspecified: Secondary | ICD-10-CM | POA: Diagnosis not present

## 2022-12-20 DIAGNOSIS — M6281 Muscle weakness (generalized): Secondary | ICD-10-CM | POA: Diagnosis not present

## 2022-12-20 DIAGNOSIS — M545 Low back pain, unspecified: Secondary | ICD-10-CM | POA: Diagnosis not present

## 2022-12-21 DIAGNOSIS — M545 Low back pain, unspecified: Secondary | ICD-10-CM | POA: Diagnosis not present

## 2022-12-21 DIAGNOSIS — M6281 Muscle weakness (generalized): Secondary | ICD-10-CM | POA: Diagnosis not present

## 2022-12-22 DIAGNOSIS — M545 Low back pain, unspecified: Secondary | ICD-10-CM | POA: Diagnosis not present

## 2022-12-22 DIAGNOSIS — M6281 Muscle weakness (generalized): Secondary | ICD-10-CM | POA: Diagnosis not present

## 2022-12-23 DIAGNOSIS — M6281 Muscle weakness (generalized): Secondary | ICD-10-CM | POA: Diagnosis not present

## 2022-12-23 DIAGNOSIS — M545 Low back pain, unspecified: Secondary | ICD-10-CM | POA: Diagnosis not present

## 2022-12-24 DIAGNOSIS — M545 Low back pain, unspecified: Secondary | ICD-10-CM | POA: Diagnosis not present

## 2022-12-24 DIAGNOSIS — M6281 Muscle weakness (generalized): Secondary | ICD-10-CM | POA: Diagnosis not present

## 2022-12-28 ENCOUNTER — Ambulatory Visit (INDEPENDENT_AMBULATORY_CARE_PROVIDER_SITE_OTHER): Payer: Medicare Other

## 2022-12-28 DIAGNOSIS — I442 Atrioventricular block, complete: Secondary | ICD-10-CM

## 2022-12-29 DIAGNOSIS — M6281 Muscle weakness (generalized): Secondary | ICD-10-CM | POA: Diagnosis not present

## 2022-12-29 DIAGNOSIS — M545 Low back pain, unspecified: Secondary | ICD-10-CM | POA: Diagnosis not present

## 2022-12-29 LAB — CUP PACEART REMOTE DEVICE CHECK
Battery Remaining Longevity: 22 mo
Battery Voltage: 2.91 V
Brady Statistic AP VP Percent: 97.53 %
Brady Statistic AP VS Percent: 0.1 %
Brady Statistic AS VP Percent: 0.52 %
Brady Statistic AS VS Percent: 1.85 %
Brady Statistic RA Percent Paced: 99.24 %
Brady Statistic RV Percent Paced: 98.05 %
Date Time Interrogation Session: 20240611004209
Implantable Lead Connection Status: 753985
Implantable Lead Connection Status: 753985
Implantable Lead Implant Date: 20190822
Implantable Lead Implant Date: 20190822
Implantable Lead Location: 753859
Implantable Lead Location: 753860
Implantable Lead Model: 3830
Implantable Lead Model: 5076
Implantable Pulse Generator Implant Date: 20190822
Lead Channel Impedance Value: 304 Ohm
Lead Channel Impedance Value: 304 Ohm
Lead Channel Impedance Value: 361 Ohm
Lead Channel Impedance Value: 399 Ohm
Lead Channel Pacing Threshold Amplitude: 0.75 V
Lead Channel Pacing Threshold Amplitude: 1.5 V
Lead Channel Pacing Threshold Pulse Width: 0.4 ms
Lead Channel Pacing Threshold Pulse Width: 0.4 ms
Lead Channel Sensing Intrinsic Amplitude: 5.125 mV
Lead Channel Sensing Intrinsic Amplitude: 5.125 mV
Lead Channel Sensing Intrinsic Amplitude: 5.875 mV
Lead Channel Sensing Intrinsic Amplitude: 5.875 mV
Lead Channel Setting Pacing Amplitude: 1.5 V
Lead Channel Setting Pacing Amplitude: 2.5 V
Lead Channel Setting Pacing Pulse Width: 1 ms
Lead Channel Setting Sensing Sensitivity: 2.8 mV
Zone Setting Status: 755011
Zone Setting Status: 755011

## 2022-12-31 DIAGNOSIS — M6281 Muscle weakness (generalized): Secondary | ICD-10-CM | POA: Diagnosis not present

## 2022-12-31 DIAGNOSIS — M545 Low back pain, unspecified: Secondary | ICD-10-CM | POA: Diagnosis not present

## 2023-01-03 DIAGNOSIS — M6281 Muscle weakness (generalized): Secondary | ICD-10-CM | POA: Diagnosis not present

## 2023-01-03 DIAGNOSIS — M545 Low back pain, unspecified: Secondary | ICD-10-CM | POA: Diagnosis not present

## 2023-01-04 DIAGNOSIS — M6281 Muscle weakness (generalized): Secondary | ICD-10-CM | POA: Diagnosis not present

## 2023-01-04 DIAGNOSIS — M545 Low back pain, unspecified: Secondary | ICD-10-CM | POA: Diagnosis not present

## 2023-01-04 DIAGNOSIS — K080 Exfoliation of teeth due to systemic causes: Secondary | ICD-10-CM | POA: Diagnosis not present

## 2023-01-05 DIAGNOSIS — M6281 Muscle weakness (generalized): Secondary | ICD-10-CM | POA: Diagnosis not present

## 2023-01-05 DIAGNOSIS — M545 Low back pain, unspecified: Secondary | ICD-10-CM | POA: Diagnosis not present

## 2023-01-06 DIAGNOSIS — M6281 Muscle weakness (generalized): Secondary | ICD-10-CM | POA: Diagnosis not present

## 2023-01-06 DIAGNOSIS — M545 Low back pain, unspecified: Secondary | ICD-10-CM | POA: Diagnosis not present

## 2023-01-07 DIAGNOSIS — I480 Paroxysmal atrial fibrillation: Secondary | ICD-10-CM | POA: Diagnosis not present

## 2023-01-07 DIAGNOSIS — M545 Low back pain, unspecified: Secondary | ICD-10-CM | POA: Diagnosis not present

## 2023-01-07 DIAGNOSIS — Z7901 Long term (current) use of anticoagulants: Secondary | ICD-10-CM | POA: Diagnosis not present

## 2023-01-07 DIAGNOSIS — D692 Other nonthrombocytopenic purpura: Secondary | ICD-10-CM | POA: Diagnosis not present

## 2023-01-07 DIAGNOSIS — M6281 Muscle weakness (generalized): Secondary | ICD-10-CM | POA: Diagnosis not present

## 2023-01-07 DIAGNOSIS — H04129 Dry eye syndrome of unspecified lacrimal gland: Secondary | ICD-10-CM | POA: Diagnosis not present

## 2023-01-10 DIAGNOSIS — F411 Generalized anxiety disorder: Secondary | ICD-10-CM | POA: Diagnosis not present

## 2023-01-10 DIAGNOSIS — M6281 Muscle weakness (generalized): Secondary | ICD-10-CM | POA: Diagnosis not present

## 2023-01-10 DIAGNOSIS — M545 Low back pain, unspecified: Secondary | ICD-10-CM | POA: Diagnosis not present

## 2023-01-10 DIAGNOSIS — F331 Major depressive disorder, recurrent, moderate: Secondary | ICD-10-CM | POA: Diagnosis not present

## 2023-01-11 DIAGNOSIS — M545 Low back pain, unspecified: Secondary | ICD-10-CM | POA: Diagnosis not present

## 2023-01-11 DIAGNOSIS — M6281 Muscle weakness (generalized): Secondary | ICD-10-CM | POA: Diagnosis not present

## 2023-01-26 NOTE — Progress Notes (Signed)
Remote pacemaker transmission.   

## 2023-01-27 DIAGNOSIS — I700 Atherosclerosis of aorta: Secondary | ICD-10-CM | POA: Diagnosis not present

## 2023-01-27 DIAGNOSIS — Z7901 Long term (current) use of anticoagulants: Secondary | ICD-10-CM | POA: Diagnosis not present

## 2023-01-27 DIAGNOSIS — I2510 Atherosclerotic heart disease of native coronary artery without angina pectoris: Secondary | ICD-10-CM | POA: Diagnosis not present

## 2023-01-27 DIAGNOSIS — I480 Paroxysmal atrial fibrillation: Secondary | ICD-10-CM | POA: Diagnosis not present

## 2023-02-15 DIAGNOSIS — I110 Hypertensive heart disease with heart failure: Secondary | ICD-10-CM | POA: Diagnosis not present

## 2023-02-15 DIAGNOSIS — L989 Disorder of the skin and subcutaneous tissue, unspecified: Secondary | ICD-10-CM | POA: Diagnosis not present

## 2023-02-15 DIAGNOSIS — F39 Unspecified mood [affective] disorder: Secondary | ICD-10-CM | POA: Diagnosis not present

## 2023-02-15 DIAGNOSIS — I480 Paroxysmal atrial fibrillation: Secondary | ICD-10-CM | POA: Diagnosis not present

## 2023-02-21 DIAGNOSIS — G629 Polyneuropathy, unspecified: Secondary | ICD-10-CM | POA: Diagnosis not present

## 2023-02-21 DIAGNOSIS — B029 Zoster without complications: Secondary | ICD-10-CM | POA: Diagnosis not present

## 2023-02-21 DIAGNOSIS — F32A Depression, unspecified: Secondary | ICD-10-CM | POA: Diagnosis not present

## 2023-02-21 DIAGNOSIS — R200 Anesthesia of skin: Secondary | ICD-10-CM | POA: Diagnosis not present

## 2023-02-28 DIAGNOSIS — B029 Zoster without complications: Secondary | ICD-10-CM | POA: Diagnosis not present

## 2023-03-07 DIAGNOSIS — I5042 Chronic combined systolic (congestive) and diastolic (congestive) heart failure: Secondary | ICD-10-CM | POA: Diagnosis not present

## 2023-03-07 DIAGNOSIS — R2681 Unsteadiness on feet: Secondary | ICD-10-CM | POA: Diagnosis not present

## 2023-03-07 DIAGNOSIS — M6281 Muscle weakness (generalized): Secondary | ICD-10-CM | POA: Diagnosis not present

## 2023-03-07 DIAGNOSIS — M6258 Muscle wasting and atrophy, not elsewhere classified, other site: Secondary | ICD-10-CM | POA: Diagnosis not present

## 2023-03-07 DIAGNOSIS — R109 Unspecified abdominal pain: Secondary | ICD-10-CM | POA: Diagnosis not present

## 2023-03-07 DIAGNOSIS — I509 Heart failure, unspecified: Secondary | ICD-10-CM | POA: Diagnosis not present

## 2023-03-07 DIAGNOSIS — R200 Anesthesia of skin: Secondary | ICD-10-CM | POA: Diagnosis not present

## 2023-03-07 DIAGNOSIS — B029 Zoster without complications: Secondary | ICD-10-CM | POA: Diagnosis not present

## 2023-03-08 DIAGNOSIS — M79604 Pain in right leg: Secondary | ICD-10-CM | POA: Diagnosis not present

## 2023-03-08 DIAGNOSIS — M6281 Muscle weakness (generalized): Secondary | ICD-10-CM | POA: Diagnosis not present

## 2023-03-08 DIAGNOSIS — M6258 Muscle wasting and atrophy, not elsewhere classified, other site: Secondary | ICD-10-CM | POA: Diagnosis not present

## 2023-03-08 DIAGNOSIS — M79674 Pain in right toe(s): Secondary | ICD-10-CM | POA: Diagnosis not present

## 2023-03-08 DIAGNOSIS — R2681 Unsteadiness on feet: Secondary | ICD-10-CM | POA: Diagnosis not present

## 2023-03-08 DIAGNOSIS — R634 Abnormal weight loss: Secondary | ICD-10-CM | POA: Diagnosis not present

## 2023-03-08 DIAGNOSIS — I5042 Chronic combined systolic (congestive) and diastolic (congestive) heart failure: Secondary | ICD-10-CM | POA: Diagnosis not present

## 2023-03-09 DIAGNOSIS — I5042 Chronic combined systolic (congestive) and diastolic (congestive) heart failure: Secondary | ICD-10-CM | POA: Diagnosis not present

## 2023-03-09 DIAGNOSIS — M6281 Muscle weakness (generalized): Secondary | ICD-10-CM | POA: Diagnosis not present

## 2023-03-09 DIAGNOSIS — R2681 Unsteadiness on feet: Secondary | ICD-10-CM | POA: Diagnosis not present

## 2023-03-09 DIAGNOSIS — M6258 Muscle wasting and atrophy, not elsewhere classified, other site: Secondary | ICD-10-CM | POA: Diagnosis not present

## 2023-03-10 DIAGNOSIS — M6258 Muscle wasting and atrophy, not elsewhere classified, other site: Secondary | ICD-10-CM | POA: Diagnosis not present

## 2023-03-10 DIAGNOSIS — R2681 Unsteadiness on feet: Secondary | ICD-10-CM | POA: Diagnosis not present

## 2023-03-10 DIAGNOSIS — M6281 Muscle weakness (generalized): Secondary | ICD-10-CM | POA: Diagnosis not present

## 2023-03-10 DIAGNOSIS — I5042 Chronic combined systolic (congestive) and diastolic (congestive) heart failure: Secondary | ICD-10-CM | POA: Diagnosis not present

## 2023-03-11 DIAGNOSIS — M6258 Muscle wasting and atrophy, not elsewhere classified, other site: Secondary | ICD-10-CM | POA: Diagnosis not present

## 2023-03-11 DIAGNOSIS — M6281 Muscle weakness (generalized): Secondary | ICD-10-CM | POA: Diagnosis not present

## 2023-03-11 DIAGNOSIS — I5042 Chronic combined systolic (congestive) and diastolic (congestive) heart failure: Secondary | ICD-10-CM | POA: Diagnosis not present

## 2023-03-11 DIAGNOSIS — R2681 Unsteadiness on feet: Secondary | ICD-10-CM | POA: Diagnosis not present

## 2023-03-14 DIAGNOSIS — M6281 Muscle weakness (generalized): Secondary | ICD-10-CM | POA: Diagnosis not present

## 2023-03-14 DIAGNOSIS — I5042 Chronic combined systolic (congestive) and diastolic (congestive) heart failure: Secondary | ICD-10-CM | POA: Diagnosis not present

## 2023-03-14 DIAGNOSIS — M6258 Muscle wasting and atrophy, not elsewhere classified, other site: Secondary | ICD-10-CM | POA: Diagnosis not present

## 2023-03-14 DIAGNOSIS — R2681 Unsteadiness on feet: Secondary | ICD-10-CM | POA: Diagnosis not present

## 2023-03-15 ENCOUNTER — Ambulatory Visit: Payer: Medicare Other | Admitting: Internal Medicine

## 2023-03-15 NOTE — Progress Notes (Signed)
Future Appointments  Date Time Provider Department  03/16/2023 11:30 AM Lucky Cowboy, MD [facility]    History of Present Illness:         This very nice 89 y.o.  WWM with  HTN, HLD,  Prediabetes, Vitamin D Deficiency Lost to follow-up since Nov 2021, presents today with c/o "" sore lower anterior ribs". Denies and GI sx's as heartburn, reflux, N/V, dyspepsia, water brash type sx's. Apparently he was incarcerated at [facility] about 2 years ago in Feb 2022, following hospitalization for Covid Pneumonia. Since then he has been residing at [facility] & under the care of the facility /house physician. He's had a couple of return visits to the ER for evaluation after falls and apparently is limited to wheelchair only for mobility. He apparently continues to receive PT/OT at [facility].     Current Outpatient Medications  Medication Instructions   acetaminophen (TYLENOL) 500 mg, Oral, 2 times daily, Twice a day PRN   albuterol (VENTOLIN HFA) 108 (90 Base) MCG/ACT inhaler 1-2 puffs, Inhalation, Every 6 hours PRN   atorvastatin (LIPITOR) 40 MG tablet Take 1 tablet daily for Cholesterol   Azelastine HCl 137 MCG/SPRAY SOLN Each Nare   buPROPion (WELLBUTRIN SR) 200 mg, Every morning   buPROPion (WELLBUTRIN XL) 300 MG 24 hr tablet Take 1 tablet daily for Mood   busPIRone (BUSPAR) 5 mg, 2 times daily   cetirizine (ZYRTEC ALLERGY) 10 mg, Oral, Daily   citalopram (CELEXA) 40 mg, Oral, Daily   DEXTRAN 70-HYPROMELLOSE, PF, OP Ophthalmic   ELIQUIS 5 MG TABS tablet TAKE 1 TABLET BY MOUTH  TWICE DAILY   furosemide (LASIX) 20 MG tablet Oral, 1/2 tab once a day for CHF   gabapentin (NEURONTIN) 300 MG capsule TAKE 1 CAPSULE BY MOUTH 3  TIMES DAILY AS NEEDED FOR  CHRONIC PAIN   LORazepam (ATIVAN) 0.5 MG tablet Oral, Daily, 0.25mg  twice a day for anxiety and essential tremor   losartan (COZAAR) 12.5 mg, Oral, Daily   Menthol, Topical Analgesic, (BIOFREEZE COOL THE PAIN) 4 % GEL Apply externally    methocarbamol (ROBAXIN) 500 MG tablet Daily PRN   nitroGLYCERIN (NITROSTAT) 0.4 mg, Sublingual, Every 5 min PRN   omeprazole (PRILOSEC OTC) 20 mg, Daily PRN   OVER THE COUNTER MEDICATION 2 capsules, Daily   potassium chloride SA (KLOR-CON) 20 MEQ tablet 40 mEq, Oral, Daily   pramoxine-zinc acetate (CALAMINE CLEAR) 1-0.1 % LOTN Topical, 2 times daily   predniSONE (DELTASONE) 40 mg, Oral, Daily   predniSONE (DELTASONE) 40 mg, Oral, Daily   propranolol ER (INDERAL LA) 120 mg, Oral, Daily, 160mg  daily   senna-docusate (SENNA S) 8.6-50 MG tablet 1 tablet, Oral, Daily   tamsulosin (FLOMAX) 0.8 mg, Oral, Daily   Vitamin D 5,000 Units, Daily    Allergies  Allergen Reactions   Erythromycin Nausea Only   Gabapentin Other (See Comments)   Fluorouracil Rash and Other (See Comments)    Mouth ulcers     Problem list He has CAD (coronary artery disease); Cardiomyopathy, ischemic; Hyperlipidemia; LBBB (left bundle branch block); Nonsustained ventricular tachycardia (HCC); Essential hypertension; Vitamin D deficiency; Medication management; Other abnormal glucose; Major depression in full remission (HCC); History of basal cell cancer; Atherosclerosis of aorta (HCC); Paroxysmal atrial flutter (HCC); Complete heart block (HCC); Senile purpura (HCC); Lumbar pain; Chronic combined systolic and diastolic heart failure (HCC); Pacemaker; History of transient ischemic attack (TIA); Acquired thrombophilia (HCC); At risk for falls; Unsteady gait; Pneumonia due to COVID-19 virus;  Mild protein malnutrition (HCC); Elevated CK; Thrombocytosis; and COVID-19 virus infection on their problem list.   Observations/Objective:  BP 128/68   Pulse 85   Temp 97.7 F (36.5 C)   Resp 16   Ht 5\' 6"  (1.676 m)   Wt 167 lb 9.6 oz (76 kg)   SpO2 97%   BMI 27.05 kg/m   HEENT - WNL. Neck - supple.  Chest - Clear equal BS. Cor - Nl HS. Irreg Rate & Rhythm w/o sig M. PP 1(+). 1-2(+) ankle edema.   Abd - Soft benign w/o  masses or tenderness.  Skin - Ecchymoses  over dorsal forearms.  MS- FROM w/o deformities.  In Wheelchair. Generalized decrease in muscle , power , tone & bulk.  Neuro -  w/o focal abnormalities. (+) Romberg. F-> N  is intact. O x 2-3. [employer].    Assessment and Plan:   1. Essential hypertension   2. Arteriosclerotic cardiovascular disease (ASCVD)   3. Persistent atrial fibrillation (HCC)   4. Sarcopenia   5. Failure to thrive in adult   6. At high risk for falls   7. Severe muscle deconditioning   8. Chronic anticoagulation on Eliquis     - Advised continued LPTx / OTx   for Gait & Balance   Follow Up Instructions:        I discussed the assessment and treatment plan with the patient. The patient was provided an opportunity to ask questions and all were answered. The patient agreed with the plan and demonstrated an understanding of the instructions. Over 25 minutes of counseling,  chart review and critical decision making was performed        The patient was advised to continue LPT and not attempt independent ambulation.      Marinus Maw, MD\

## 2023-03-15 NOTE — Progress Notes (Incomplete)
Future Appointments  Date Time Provider Department  03/16/2023 11:30 AM Lucky Cowboy, MD [facility]    History of Present Illness:      Lost to follow-up since Nov 2021  Current Outpatient Medications on File Prior to Visit  Medication Sig  . albuterol (VENTOLIN HFA) 108 (90 Base) MCG/ACT inhaler Inhale 1-2 puffs into the lungs every 6 (six) hours as needed for wheezing or shortness of breath.  [name] atorvastatin (LIPITOR) 40 MG tablet Take 1 tablet daily for Cholesterol (Patient taking differently: Take 40 mg by mouth daily. for Cholesterol)  . Azelastine HCl 137 MCG/SPRAY SOLN Place into both nostrils.  [name] buPROPion (WELLBUTRIN SR) 200 MG 12 hr tablet Take 200 mg by mouth every morning.  [name] buPROPion (WELLBUTRIN XL) 300 MG 24 hr tablet Take 1 tablet daily for Mood (Patient taking differently: Take 300 mg by mouth daily. for Mood)  . busPIRone (BUSPAR) 5 MG tablet Take 5 mg by mouth 2 (two) times daily.  . Cholecalciferol (VITAMIN D) 125 MCG (5000 UT) CAPS Take 5,000 Units by mouth daily.  . citalopram (CELEXA) 40 MG tablet Take 1 tablet (40 mg total) by mouth daily.  [name] ELIQUIS 5 MG TABS tablet TAKE 1 TABLET BY MOUTH  TWICE DAILY (Patient taking differently: Take 5 mg by mouth 2 (two) times daily.)  . gabapentin (NEURONTIN) 300 MG capsule TAKE 1 CAPSULE BY MOUTH 3  TIMES DAILY AS NEEDED FOR  CHRONIC PAIN (Patient taking differently: Take 300 mg by mouth 3 (three) times daily as needed (chronic pain).)  . losartan (COZAAR) 25 MG tablet Take 0.5 tablets (12.5 mg total) by mouth daily.  . methocarbamol (ROBAXIN) 500 MG tablet Take by mouth daily as needed.  . nitroGLYCERIN (NITROSTAT) 0.4 MG SL tablet Place 1 tablet (0.4 mg total) under the tongue every 5 (five) minutes as needed for chest pain.  [name] omeprazole (PRILOSEC OTC) 20 MG tablet Take 20 mg by mouth daily as needed (acid reflux).  [name] OVER THE COUNTER MEDICATION Take 2 capsules by mouth daily. Omega Q with resveratrol and  turmeric  . potassium chloride SA (KLOR-CON) 20 MEQ tablet Take 40 mEq by mouth daily.  . predniSONE (DELTASONE) 20 MG tablet Take 2 tablets (40 mg total) by mouth daily.  . predniSONE (DELTASONE) 20 MG tablet Take 2 tablets (40 mg total) by mouth daily.  . propranolol ER (INDERAL LA) 120 MG 24 hr capsule Take 120 mg by mouth daily.     Allergies  Allergen Reactions  . Erythromycin Nausea Only  . Gabapentin Other (See Comments)  . Fluorouracil Rash and Other (See Comments)    Mouth ulcers     Problem list He has CAD (coronary artery disease); Cardiomyopathy, ischemic; Hyperlipidemia; LBBB (left bundle branch block); Nonsustained ventricular tachycardia (HCC); Essential hypertension; Vitamin D deficiency; Medication management; Other abnormal glucose; Major depression in full remission (HCC); History of basal cell cancer; Atherosclerosis of aorta (HCC); Paroxysmal atrial flutter (HCC); Complete heart block (HCC); Senile purpura (HCC); Lumbar pain; Chronic combined systolic and diastolic heart failure (HCC); Pacemaker; History of transient ischemic attack (TIA); Acquired thrombophilia (HCC); At risk for falls; Unsteady gait; Pneumonia due to COVID-19 virus; Mild protein malnutrition (HCC); Elevated CK; Thrombocytosis; and COVID-19 virus infection on their problem list.   Observations/Objective:  There were no vitals taken for this visit.  HEENT - WNL. Neck - supple.  Chest - Clear equal BS. Cor - Nl HS. RRR w/o sig MGR. PP 1(+). No edema. MS-  FROM w/o deformities.  Gait Nl. Neuro -  Nl w/o focal abnormalities.   Assessment and Plan:      Follow Up Instructions:        I discussed the assessment and treatment plan with the patient. The patient was provided an opportunity to ask questions and all were answered. The patient agreed with the plan and demonstrated an understanding of the instructions.       The patient was advised to call back or seek an in-person evaluation if the  symptoms worsen or if the condition fails to improve as anticipated.    Marinus Maw, MD\

## 2023-03-16 ENCOUNTER — Ambulatory Visit (INDEPENDENT_AMBULATORY_CARE_PROVIDER_SITE_OTHER): Payer: Medicare Other | Admitting: Internal Medicine

## 2023-03-16 ENCOUNTER — Encounter: Payer: Self-pay | Admitting: Internal Medicine

## 2023-03-16 VITALS — BP 128/68 | HR 85 | Temp 97.7°F | Resp 16 | Ht 66.0 in | Wt 167.6 lb

## 2023-03-16 DIAGNOSIS — I4819 Other persistent atrial fibrillation: Secondary | ICD-10-CM | POA: Diagnosis not present

## 2023-03-16 DIAGNOSIS — M6284 Sarcopenia: Secondary | ICD-10-CM | POA: Diagnosis not present

## 2023-03-16 DIAGNOSIS — R627 Adult failure to thrive: Secondary | ICD-10-CM

## 2023-03-16 DIAGNOSIS — I2510 Atherosclerotic heart disease of native coronary artery without angina pectoris: Secondary | ICD-10-CM

## 2023-03-16 DIAGNOSIS — Z9181 History of falling: Secondary | ICD-10-CM

## 2023-03-16 DIAGNOSIS — I10 Essential (primary) hypertension: Secondary | ICD-10-CM | POA: Diagnosis not present

## 2023-03-16 DIAGNOSIS — R29898 Other symptoms and signs involving the musculoskeletal system: Secondary | ICD-10-CM

## 2023-03-16 DIAGNOSIS — Z7901 Long term (current) use of anticoagulants: Secondary | ICD-10-CM

## 2023-03-17 DIAGNOSIS — R296 Repeated falls: Secondary | ICD-10-CM | POA: Diagnosis not present

## 2023-03-17 DIAGNOSIS — R2681 Unsteadiness on feet: Secondary | ICD-10-CM | POA: Diagnosis not present

## 2023-03-17 DIAGNOSIS — M6258 Muscle wasting and atrophy, not elsewhere classified, other site: Secondary | ICD-10-CM | POA: Diagnosis not present

## 2023-03-17 DIAGNOSIS — R200 Anesthesia of skin: Secondary | ICD-10-CM | POA: Diagnosis not present

## 2023-03-17 DIAGNOSIS — I5042 Chronic combined systolic (congestive) and diastolic (congestive) heart failure: Secondary | ICD-10-CM | POA: Diagnosis not present

## 2023-03-17 DIAGNOSIS — M6281 Muscle weakness (generalized): Secondary | ICD-10-CM | POA: Diagnosis not present

## 2023-03-17 DIAGNOSIS — G250 Essential tremor: Secondary | ICD-10-CM | POA: Diagnosis not present

## 2023-03-17 DIAGNOSIS — I480 Paroxysmal atrial fibrillation: Secondary | ICD-10-CM | POA: Diagnosis not present

## 2023-03-18 DIAGNOSIS — M6258 Muscle wasting and atrophy, not elsewhere classified, other site: Secondary | ICD-10-CM | POA: Diagnosis not present

## 2023-03-18 DIAGNOSIS — I5042 Chronic combined systolic (congestive) and diastolic (congestive) heart failure: Secondary | ICD-10-CM | POA: Diagnosis not present

## 2023-03-18 DIAGNOSIS — R2681 Unsteadiness on feet: Secondary | ICD-10-CM | POA: Diagnosis not present

## 2023-03-18 DIAGNOSIS — M6281 Muscle weakness (generalized): Secondary | ICD-10-CM | POA: Diagnosis not present

## 2023-03-21 DIAGNOSIS — R2681 Unsteadiness on feet: Secondary | ICD-10-CM | POA: Diagnosis not present

## 2023-03-21 DIAGNOSIS — M6258 Muscle wasting and atrophy, not elsewhere classified, other site: Secondary | ICD-10-CM | POA: Diagnosis not present

## 2023-03-21 DIAGNOSIS — I5042 Chronic combined systolic (congestive) and diastolic (congestive) heart failure: Secondary | ICD-10-CM | POA: Diagnosis not present

## 2023-03-21 DIAGNOSIS — M6281 Muscle weakness (generalized): Secondary | ICD-10-CM | POA: Diagnosis not present

## 2023-03-23 DIAGNOSIS — M6281 Muscle weakness (generalized): Secondary | ICD-10-CM | POA: Diagnosis not present

## 2023-03-23 DIAGNOSIS — R2681 Unsteadiness on feet: Secondary | ICD-10-CM | POA: Diagnosis not present

## 2023-03-23 DIAGNOSIS — I5042 Chronic combined systolic (congestive) and diastolic (congestive) heart failure: Secondary | ICD-10-CM | POA: Diagnosis not present

## 2023-03-23 DIAGNOSIS — M6258 Muscle wasting and atrophy, not elsewhere classified, other site: Secondary | ICD-10-CM | POA: Diagnosis not present

## 2023-03-24 DIAGNOSIS — M6258 Muscle wasting and atrophy, not elsewhere classified, other site: Secondary | ICD-10-CM | POA: Diagnosis not present

## 2023-03-24 DIAGNOSIS — R2681 Unsteadiness on feet: Secondary | ICD-10-CM | POA: Diagnosis not present

## 2023-03-24 DIAGNOSIS — I5042 Chronic combined systolic (congestive) and diastolic (congestive) heart failure: Secondary | ICD-10-CM | POA: Diagnosis not present

## 2023-03-24 DIAGNOSIS — M6281 Muscle weakness (generalized): Secondary | ICD-10-CM | POA: Diagnosis not present

## 2023-03-25 DIAGNOSIS — R2681 Unsteadiness on feet: Secondary | ICD-10-CM | POA: Diagnosis not present

## 2023-03-25 DIAGNOSIS — M6281 Muscle weakness (generalized): Secondary | ICD-10-CM | POA: Diagnosis not present

## 2023-03-25 DIAGNOSIS — I5042 Chronic combined systolic (congestive) and diastolic (congestive) heart failure: Secondary | ICD-10-CM | POA: Diagnosis not present

## 2023-03-25 DIAGNOSIS — M6258 Muscle wasting and atrophy, not elsewhere classified, other site: Secondary | ICD-10-CM | POA: Diagnosis not present

## 2023-03-28 DIAGNOSIS — R634 Abnormal weight loss: Secondary | ICD-10-CM | POA: Diagnosis not present

## 2023-03-28 DIAGNOSIS — G250 Essential tremor: Secondary | ICD-10-CM | POA: Diagnosis not present

## 2023-03-28 DIAGNOSIS — M4802 Spinal stenosis, cervical region: Secondary | ICD-10-CM | POA: Diagnosis not present

## 2023-03-28 DIAGNOSIS — I509 Heart failure, unspecified: Secondary | ICD-10-CM | POA: Diagnosis not present

## 2023-03-29 ENCOUNTER — Ambulatory Visit (INDEPENDENT_AMBULATORY_CARE_PROVIDER_SITE_OTHER): Payer: Medicare Other

## 2023-03-29 DIAGNOSIS — R2681 Unsteadiness on feet: Secondary | ICD-10-CM | POA: Diagnosis not present

## 2023-03-29 DIAGNOSIS — M6258 Muscle wasting and atrophy, not elsewhere classified, other site: Secondary | ICD-10-CM | POA: Diagnosis not present

## 2023-03-29 DIAGNOSIS — M6281 Muscle weakness (generalized): Secondary | ICD-10-CM | POA: Diagnosis not present

## 2023-03-29 DIAGNOSIS — I442 Atrioventricular block, complete: Secondary | ICD-10-CM | POA: Diagnosis not present

## 2023-03-29 DIAGNOSIS — I5042 Chronic combined systolic (congestive) and diastolic (congestive) heart failure: Secondary | ICD-10-CM | POA: Diagnosis not present

## 2023-03-30 DIAGNOSIS — M6281 Muscle weakness (generalized): Secondary | ICD-10-CM | POA: Diagnosis not present

## 2023-03-30 DIAGNOSIS — R2681 Unsteadiness on feet: Secondary | ICD-10-CM | POA: Diagnosis not present

## 2023-03-30 DIAGNOSIS — M6258 Muscle wasting and atrophy, not elsewhere classified, other site: Secondary | ICD-10-CM | POA: Diagnosis not present

## 2023-03-30 DIAGNOSIS — I5042 Chronic combined systolic (congestive) and diastolic (congestive) heart failure: Secondary | ICD-10-CM | POA: Diagnosis not present

## 2023-03-30 LAB — CUP PACEART REMOTE DEVICE CHECK
Battery Remaining Longevity: 21 mo
Battery Voltage: 2.9 V
Brady Statistic AP VP Percent: 97.9 %
Brady Statistic AP VS Percent: 0.14 %
Brady Statistic AS VP Percent: 0.87 %
Brady Statistic AS VS Percent: 1.1 %
Brady Statistic RA Percent Paced: 98.94 %
Brady Statistic RV Percent Paced: 98.77 %
Date Time Interrogation Session: 20240909201445
Implantable Lead Connection Status: 753985
Implantable Lead Connection Status: 753985
Implantable Lead Implant Date: 20190822
Implantable Lead Implant Date: 20190822
Implantable Lead Location: 753859
Implantable Lead Location: 753860
Implantable Lead Model: 3830
Implantable Lead Model: 5076
Implantable Pulse Generator Implant Date: 20190822
Lead Channel Impedance Value: 304 Ohm
Lead Channel Impedance Value: 304 Ohm
Lead Channel Impedance Value: 361 Ohm
Lead Channel Impedance Value: 418 Ohm
Lead Channel Pacing Threshold Amplitude: 0.75 V
Lead Channel Pacing Threshold Amplitude: 1.25 V
Lead Channel Pacing Threshold Pulse Width: 0.4 ms
Lead Channel Pacing Threshold Pulse Width: 0.4 ms
Lead Channel Sensing Intrinsic Amplitude: 4.375 mV
Lead Channel Sensing Intrinsic Amplitude: 4.375 mV
Lead Channel Sensing Intrinsic Amplitude: 5.25 mV
Lead Channel Sensing Intrinsic Amplitude: 5.25 mV
Lead Channel Setting Pacing Amplitude: 1.5 V
Lead Channel Setting Pacing Amplitude: 2.5 V
Lead Channel Setting Pacing Pulse Width: 1 ms
Lead Channel Setting Sensing Sensitivity: 2.8 mV
Zone Setting Status: 755011
Zone Setting Status: 755011

## 2023-03-31 DIAGNOSIS — M6258 Muscle wasting and atrophy, not elsewhere classified, other site: Secondary | ICD-10-CM | POA: Diagnosis not present

## 2023-03-31 DIAGNOSIS — R2681 Unsteadiness on feet: Secondary | ICD-10-CM | POA: Diagnosis not present

## 2023-03-31 DIAGNOSIS — I5042 Chronic combined systolic (congestive) and diastolic (congestive) heart failure: Secondary | ICD-10-CM | POA: Diagnosis not present

## 2023-03-31 DIAGNOSIS — M6281 Muscle weakness (generalized): Secondary | ICD-10-CM | POA: Diagnosis not present

## 2023-04-05 DIAGNOSIS — R2681 Unsteadiness on feet: Secondary | ICD-10-CM | POA: Diagnosis not present

## 2023-04-05 DIAGNOSIS — M6281 Muscle weakness (generalized): Secondary | ICD-10-CM | POA: Diagnosis not present

## 2023-04-05 DIAGNOSIS — M6258 Muscle wasting and atrophy, not elsewhere classified, other site: Secondary | ICD-10-CM | POA: Diagnosis not present

## 2023-04-05 DIAGNOSIS — I5042 Chronic combined systolic (congestive) and diastolic (congestive) heart failure: Secondary | ICD-10-CM | POA: Diagnosis not present

## 2023-04-07 DIAGNOSIS — M6281 Muscle weakness (generalized): Secondary | ICD-10-CM | POA: Diagnosis not present

## 2023-04-07 DIAGNOSIS — I5042 Chronic combined systolic (congestive) and diastolic (congestive) heart failure: Secondary | ICD-10-CM | POA: Diagnosis not present

## 2023-04-07 DIAGNOSIS — M6258 Muscle wasting and atrophy, not elsewhere classified, other site: Secondary | ICD-10-CM | POA: Diagnosis not present

## 2023-04-07 DIAGNOSIS — R2681 Unsteadiness on feet: Secondary | ICD-10-CM | POA: Diagnosis not present

## 2023-04-08 DIAGNOSIS — M6258 Muscle wasting and atrophy, not elsewhere classified, other site: Secondary | ICD-10-CM | POA: Diagnosis not present

## 2023-04-08 DIAGNOSIS — I5042 Chronic combined systolic (congestive) and diastolic (congestive) heart failure: Secondary | ICD-10-CM | POA: Diagnosis not present

## 2023-04-08 DIAGNOSIS — M6281 Muscle weakness (generalized): Secondary | ICD-10-CM | POA: Diagnosis not present

## 2023-04-08 DIAGNOSIS — R2681 Unsteadiness on feet: Secondary | ICD-10-CM | POA: Diagnosis not present

## 2023-04-11 DIAGNOSIS — M6258 Muscle wasting and atrophy, not elsewhere classified, other site: Secondary | ICD-10-CM | POA: Diagnosis not present

## 2023-04-11 DIAGNOSIS — M6281 Muscle weakness (generalized): Secondary | ICD-10-CM | POA: Diagnosis not present

## 2023-04-11 DIAGNOSIS — I5042 Chronic combined systolic (congestive) and diastolic (congestive) heart failure: Secondary | ICD-10-CM | POA: Diagnosis not present

## 2023-04-11 DIAGNOSIS — R2681 Unsteadiness on feet: Secondary | ICD-10-CM | POA: Diagnosis not present

## 2023-04-12 DIAGNOSIS — I5042 Chronic combined systolic (congestive) and diastolic (congestive) heart failure: Secondary | ICD-10-CM | POA: Diagnosis not present

## 2023-04-12 DIAGNOSIS — M6281 Muscle weakness (generalized): Secondary | ICD-10-CM | POA: Diagnosis not present

## 2023-04-12 DIAGNOSIS — R2681 Unsteadiness on feet: Secondary | ICD-10-CM | POA: Diagnosis not present

## 2023-04-12 DIAGNOSIS — M6258 Muscle wasting and atrophy, not elsewhere classified, other site: Secondary | ICD-10-CM | POA: Diagnosis not present

## 2023-04-13 DIAGNOSIS — M6258 Muscle wasting and atrophy, not elsewhere classified, other site: Secondary | ICD-10-CM | POA: Diagnosis not present

## 2023-04-13 DIAGNOSIS — R2681 Unsteadiness on feet: Secondary | ICD-10-CM | POA: Diagnosis not present

## 2023-04-13 DIAGNOSIS — M6281 Muscle weakness (generalized): Secondary | ICD-10-CM | POA: Diagnosis not present

## 2023-04-13 DIAGNOSIS — I5042 Chronic combined systolic (congestive) and diastolic (congestive) heart failure: Secondary | ICD-10-CM | POA: Diagnosis not present

## 2023-04-15 NOTE — Progress Notes (Signed)
Remote pacemaker transmission.   

## 2023-04-21 DIAGNOSIS — Z7185 Encounter for immunization safety counseling: Secondary | ICD-10-CM | POA: Diagnosis not present

## 2023-04-21 DIAGNOSIS — Z23 Encounter for immunization: Secondary | ICD-10-CM | POA: Diagnosis not present

## 2023-04-29 DIAGNOSIS — R972 Elevated prostate specific antigen [PSA]: Secondary | ICD-10-CM | POA: Diagnosis not present

## 2023-05-06 DIAGNOSIS — R972 Elevated prostate specific antigen [PSA]: Secondary | ICD-10-CM | POA: Diagnosis not present

## 2023-05-06 DIAGNOSIS — R350 Frequency of micturition: Secondary | ICD-10-CM | POA: Diagnosis not present

## 2023-05-06 DIAGNOSIS — N200 Calculus of kidney: Secondary | ICD-10-CM | POA: Diagnosis not present

## 2023-05-06 DIAGNOSIS — N403 Nodular prostate with lower urinary tract symptoms: Secondary | ICD-10-CM | POA: Diagnosis not present

## 2023-05-11 ENCOUNTER — Encounter: Payer: Self-pay | Admitting: Internal Medicine

## 2023-05-13 DIAGNOSIS — H52203 Unspecified astigmatism, bilateral: Secondary | ICD-10-CM | POA: Diagnosis not present

## 2023-05-19 DIAGNOSIS — L603 Nail dystrophy: Secondary | ICD-10-CM | POA: Diagnosis not present

## 2023-05-19 DIAGNOSIS — L602 Onychogryphosis: Secondary | ICD-10-CM | POA: Diagnosis not present

## 2023-05-19 DIAGNOSIS — I2510 Atherosclerotic heart disease of native coronary artery without angina pectoris: Secondary | ICD-10-CM | POA: Diagnosis not present

## 2023-05-19 DIAGNOSIS — G250 Essential tremor: Secondary | ICD-10-CM | POA: Diagnosis not present

## 2023-05-19 DIAGNOSIS — I480 Paroxysmal atrial fibrillation: Secondary | ICD-10-CM | POA: Diagnosis not present

## 2023-05-19 DIAGNOSIS — Z7901 Long term (current) use of anticoagulants: Secondary | ICD-10-CM | POA: Diagnosis not present

## 2023-05-19 DIAGNOSIS — I739 Peripheral vascular disease, unspecified: Secondary | ICD-10-CM | POA: Diagnosis not present

## 2023-05-23 DIAGNOSIS — F411 Generalized anxiety disorder: Secondary | ICD-10-CM | POA: Diagnosis not present

## 2023-05-23 DIAGNOSIS — F331 Major depressive disorder, recurrent, moderate: Secondary | ICD-10-CM | POA: Diagnosis not present

## 2023-05-30 DIAGNOSIS — L821 Other seborrheic keratosis: Secondary | ICD-10-CM | POA: Diagnosis not present

## 2023-05-30 DIAGNOSIS — D225 Melanocytic nevi of trunk: Secondary | ICD-10-CM | POA: Diagnosis not present

## 2023-05-30 DIAGNOSIS — L814 Other melanin hyperpigmentation: Secondary | ICD-10-CM | POA: Diagnosis not present

## 2023-05-30 DIAGNOSIS — Z08 Encounter for follow-up examination after completed treatment for malignant neoplasm: Secondary | ICD-10-CM | POA: Diagnosis not present

## 2023-05-30 DIAGNOSIS — L570 Actinic keratosis: Secondary | ICD-10-CM | POA: Diagnosis not present

## 2023-06-07 DIAGNOSIS — I480 Paroxysmal atrial fibrillation: Secondary | ICD-10-CM | POA: Diagnosis not present

## 2023-06-07 DIAGNOSIS — M6258 Muscle wasting and atrophy, not elsewhere classified, other site: Secondary | ICD-10-CM | POA: Diagnosis not present

## 2023-06-07 DIAGNOSIS — I509 Heart failure, unspecified: Secondary | ICD-10-CM | POA: Diagnosis not present

## 2023-06-07 DIAGNOSIS — R41841 Cognitive communication deficit: Secondary | ICD-10-CM | POA: Diagnosis not present

## 2023-06-07 DIAGNOSIS — R0601 Orthopnea: Secondary | ICD-10-CM | POA: Diagnosis not present

## 2023-06-08 DIAGNOSIS — R059 Cough, unspecified: Secondary | ICD-10-CM | POA: Diagnosis not present

## 2023-06-08 DIAGNOSIS — M6258 Muscle wasting and atrophy, not elsewhere classified, other site: Secondary | ICD-10-CM | POA: Diagnosis not present

## 2023-06-08 DIAGNOSIS — R41841 Cognitive communication deficit: Secondary | ICD-10-CM | POA: Diagnosis not present

## 2023-06-08 DIAGNOSIS — R0989 Other specified symptoms and signs involving the circulatory and respiratory systems: Secondary | ICD-10-CM | POA: Diagnosis not present

## 2023-06-09 DIAGNOSIS — M6258 Muscle wasting and atrophy, not elsewhere classified, other site: Secondary | ICD-10-CM | POA: Diagnosis not present

## 2023-06-09 DIAGNOSIS — R41841 Cognitive communication deficit: Secondary | ICD-10-CM | POA: Diagnosis not present

## 2023-06-10 DIAGNOSIS — R41841 Cognitive communication deficit: Secondary | ICD-10-CM | POA: Diagnosis not present

## 2023-06-10 DIAGNOSIS — M6258 Muscle wasting and atrophy, not elsewhere classified, other site: Secondary | ICD-10-CM | POA: Diagnosis not present

## 2023-06-12 DIAGNOSIS — R41841 Cognitive communication deficit: Secondary | ICD-10-CM | POA: Diagnosis not present

## 2023-06-12 DIAGNOSIS — M6258 Muscle wasting and atrophy, not elsewhere classified, other site: Secondary | ICD-10-CM | POA: Diagnosis not present

## 2023-06-14 DIAGNOSIS — R41841 Cognitive communication deficit: Secondary | ICD-10-CM | POA: Diagnosis not present

## 2023-06-14 DIAGNOSIS — M6258 Muscle wasting and atrophy, not elsewhere classified, other site: Secondary | ICD-10-CM | POA: Diagnosis not present

## 2023-06-22 DIAGNOSIS — M6258 Muscle wasting and atrophy, not elsewhere classified, other site: Secondary | ICD-10-CM | POA: Diagnosis not present

## 2023-06-22 DIAGNOSIS — M6281 Muscle weakness (generalized): Secondary | ICD-10-CM | POA: Diagnosis not present

## 2023-06-22 DIAGNOSIS — R2689 Other abnormalities of gait and mobility: Secondary | ICD-10-CM | POA: Diagnosis not present

## 2023-06-26 DIAGNOSIS — M6281 Muscle weakness (generalized): Secondary | ICD-10-CM | POA: Diagnosis not present

## 2023-06-26 DIAGNOSIS — R2689 Other abnormalities of gait and mobility: Secondary | ICD-10-CM | POA: Diagnosis not present

## 2023-06-26 DIAGNOSIS — M6258 Muscle wasting and atrophy, not elsewhere classified, other site: Secondary | ICD-10-CM | POA: Diagnosis not present

## 2023-06-27 DIAGNOSIS — M6258 Muscle wasting and atrophy, not elsewhere classified, other site: Secondary | ICD-10-CM | POA: Diagnosis not present

## 2023-06-27 DIAGNOSIS — R2689 Other abnormalities of gait and mobility: Secondary | ICD-10-CM | POA: Diagnosis not present

## 2023-06-27 DIAGNOSIS — M6281 Muscle weakness (generalized): Secondary | ICD-10-CM | POA: Diagnosis not present

## 2023-06-28 ENCOUNTER — Ambulatory Visit (INDEPENDENT_AMBULATORY_CARE_PROVIDER_SITE_OTHER): Payer: Medicare Other

## 2023-06-28 DIAGNOSIS — M6281 Muscle weakness (generalized): Secondary | ICD-10-CM | POA: Diagnosis not present

## 2023-06-28 DIAGNOSIS — R2689 Other abnormalities of gait and mobility: Secondary | ICD-10-CM | POA: Diagnosis not present

## 2023-06-28 DIAGNOSIS — I442 Atrioventricular block, complete: Secondary | ICD-10-CM

## 2023-06-28 DIAGNOSIS — M6258 Muscle wasting and atrophy, not elsewhere classified, other site: Secondary | ICD-10-CM | POA: Diagnosis not present

## 2023-06-29 DIAGNOSIS — M6281 Muscle weakness (generalized): Secondary | ICD-10-CM | POA: Diagnosis not present

## 2023-06-29 DIAGNOSIS — M4802 Spinal stenosis, cervical region: Secondary | ICD-10-CM | POA: Diagnosis not present

## 2023-06-29 DIAGNOSIS — R200 Anesthesia of skin: Secondary | ICD-10-CM | POA: Diagnosis not present

## 2023-06-29 DIAGNOSIS — R42 Dizziness and giddiness: Secondary | ICD-10-CM | POA: Diagnosis not present

## 2023-06-29 DIAGNOSIS — G629 Polyneuropathy, unspecified: Secondary | ICD-10-CM | POA: Diagnosis not present

## 2023-06-29 DIAGNOSIS — R2689 Other abnormalities of gait and mobility: Secondary | ICD-10-CM | POA: Diagnosis not present

## 2023-06-29 DIAGNOSIS — M6258 Muscle wasting and atrophy, not elsewhere classified, other site: Secondary | ICD-10-CM | POA: Diagnosis not present

## 2023-06-29 LAB — CUP PACEART REMOTE DEVICE CHECK
Battery Remaining Longevity: 19 mo
Battery Voltage: 2.88 V
Brady Statistic AP VP Percent: 98.15 %
Brady Statistic AP VS Percent: 0.21 %
Brady Statistic AS VP Percent: 0.54 %
Brady Statistic AS VS Percent: 1.11 %
Brady Statistic RA Percent Paced: 99.25 %
Brady Statistic RV Percent Paced: 98.69 %
Date Time Interrogation Session: 20241210021741
Implantable Lead Connection Status: 753985
Implantable Lead Connection Status: 753985
Implantable Lead Implant Date: 20190822
Implantable Lead Implant Date: 20190822
Implantable Lead Location: 753859
Implantable Lead Location: 753860
Implantable Lead Model: 3830
Implantable Lead Model: 5076
Implantable Pulse Generator Implant Date: 20190822
Lead Channel Impedance Value: 304 Ohm
Lead Channel Impedance Value: 304 Ohm
Lead Channel Impedance Value: 361 Ohm
Lead Channel Impedance Value: 418 Ohm
Lead Channel Pacing Threshold Amplitude: 0.75 V
Lead Channel Pacing Threshold Amplitude: 1.5 V
Lead Channel Pacing Threshold Pulse Width: 0.4 ms
Lead Channel Pacing Threshold Pulse Width: 0.4 ms
Lead Channel Sensing Intrinsic Amplitude: 4.375 mV
Lead Channel Sensing Intrinsic Amplitude: 4.375 mV
Lead Channel Sensing Intrinsic Amplitude: 5.625 mV
Lead Channel Sensing Intrinsic Amplitude: 5.625 mV
Lead Channel Setting Pacing Amplitude: 1.5 V
Lead Channel Setting Pacing Amplitude: 2.5 V
Lead Channel Setting Pacing Pulse Width: 1 ms
Lead Channel Setting Sensing Sensitivity: 2.8 mV
Zone Setting Status: 755011
Zone Setting Status: 755011

## 2023-06-30 DIAGNOSIS — M6281 Muscle weakness (generalized): Secondary | ICD-10-CM | POA: Diagnosis not present

## 2023-06-30 DIAGNOSIS — M6258 Muscle wasting and atrophy, not elsewhere classified, other site: Secondary | ICD-10-CM | POA: Diagnosis not present

## 2023-06-30 DIAGNOSIS — R2689 Other abnormalities of gait and mobility: Secondary | ICD-10-CM | POA: Diagnosis not present

## 2023-06-30 DIAGNOSIS — K080 Exfoliation of teeth due to systemic causes: Secondary | ICD-10-CM | POA: Diagnosis not present

## 2023-07-01 DIAGNOSIS — M6281 Muscle weakness (generalized): Secondary | ICD-10-CM | POA: Diagnosis not present

## 2023-07-01 DIAGNOSIS — R2689 Other abnormalities of gait and mobility: Secondary | ICD-10-CM | POA: Diagnosis not present

## 2023-07-01 DIAGNOSIS — M6258 Muscle wasting and atrophy, not elsewhere classified, other site: Secondary | ICD-10-CM | POA: Diagnosis not present

## 2023-07-05 ENCOUNTER — Emergency Department (HOSPITAL_COMMUNITY): Payer: Medicare Other

## 2023-07-05 ENCOUNTER — Encounter (HOSPITAL_COMMUNITY): Payer: Self-pay | Admitting: Emergency Medicine

## 2023-07-05 ENCOUNTER — Emergency Department (HOSPITAL_COMMUNITY)
Admission: EM | Admit: 2023-07-05 | Discharge: 2023-07-05 | Disposition: A | Discharge status: Skilled Nursing Facility | Payer: Medicare Other | Attending: Emergency Medicine | Admitting: Emergency Medicine

## 2023-07-05 ENCOUNTER — Other Ambulatory Visit: Payer: Self-pay

## 2023-07-05 DIAGNOSIS — I10 Essential (primary) hypertension: Secondary | ICD-10-CM | POA: Diagnosis not present

## 2023-07-05 DIAGNOSIS — S0990XA Unspecified injury of head, initial encounter: Secondary | ICD-10-CM | POA: Diagnosis not present

## 2023-07-05 DIAGNOSIS — Z950 Presence of cardiac pacemaker: Secondary | ICD-10-CM | POA: Diagnosis not present

## 2023-07-05 DIAGNOSIS — I252 Old myocardial infarction: Secondary | ICD-10-CM | POA: Insufficient documentation

## 2023-07-05 DIAGNOSIS — Z043 Encounter for examination and observation following other accident: Secondary | ICD-10-CM | POA: Diagnosis not present

## 2023-07-05 DIAGNOSIS — M25512 Pain in left shoulder: Secondary | ICD-10-CM | POA: Diagnosis not present

## 2023-07-05 DIAGNOSIS — I2510 Atherosclerotic heart disease of native coronary artery without angina pectoris: Secondary | ICD-10-CM | POA: Insufficient documentation

## 2023-07-05 DIAGNOSIS — W1839XA Other fall on same level, initial encounter: Secondary | ICD-10-CM | POA: Insufficient documentation

## 2023-07-05 DIAGNOSIS — Z951 Presence of aortocoronary bypass graft: Secondary | ICD-10-CM | POA: Diagnosis not present

## 2023-07-05 DIAGNOSIS — Z7401 Bed confinement status: Secondary | ICD-10-CM | POA: Diagnosis not present

## 2023-07-05 DIAGNOSIS — S4982XA Other specified injuries of left shoulder and upper arm, initial encounter: Secondary | ICD-10-CM | POA: Diagnosis not present

## 2023-07-05 DIAGNOSIS — R58 Hemorrhage, not elsewhere classified: Secondary | ICD-10-CM | POA: Diagnosis not present

## 2023-07-05 DIAGNOSIS — Z7901 Long term (current) use of anticoagulants: Secondary | ICD-10-CM | POA: Insufficient documentation

## 2023-07-05 DIAGNOSIS — R531 Weakness: Secondary | ICD-10-CM | POA: Diagnosis not present

## 2023-07-05 DIAGNOSIS — W19XXXA Unspecified fall, initial encounter: Secondary | ICD-10-CM | POA: Diagnosis not present

## 2023-07-05 DIAGNOSIS — S0101XA Laceration without foreign body of scalp, initial encounter: Secondary | ICD-10-CM | POA: Diagnosis not present

## 2023-07-05 DIAGNOSIS — Z87891 Personal history of nicotine dependence: Secondary | ICD-10-CM | POA: Diagnosis not present

## 2023-07-05 DIAGNOSIS — Z859 Personal history of malignant neoplasm, unspecified: Secondary | ICD-10-CM | POA: Diagnosis not present

## 2023-07-05 DIAGNOSIS — R519 Headache, unspecified: Secondary | ICD-10-CM | POA: Diagnosis not present

## 2023-07-05 MED ORDER — LIDOCAINE HCL (PF) 1 % IJ SOLN
10.0000 mL | Freq: Once | INTRAMUSCULAR | Status: AC
  Administered 2023-07-05: 10 mL
  Filled 2023-07-05: qty 10

## 2023-07-05 NOTE — ED Notes (Signed)
I clean pt's head with normal saline

## 2023-07-05 NOTE — ED Provider Notes (Signed)
[facility]PT [facility] Emergency Department Provider Note MRN:  [id number]  Arrival date & time: 07/05/23     Chief Complaint   Fall   History of Present Illness   Paul Frazier. is a 89 y.o. year-old male with a history of CAD, hypertension, CABG presenting to the ED with chief complaint of fall.  Tried to be in a bottle and lost his balance and fell forward striking his head.  Did not lose consciousness.  Takes Eliquis.  No neck pain, no back pain, no chest pain, no shortness of breath, no abdominal pain, no injuries to the arms or legs but has some mild soreness to the left shoulder but can move it fine.  Review of Systems  A thorough review of systems was obtained and all systems are negative except as noted in the HPI and PMH.   Patient's Health History    Past Medical History:  Diagnosis Date   Arthritis    CAD (coronary artery disease)    Cancer (HCC)    Hyperlipidemia    Hypertension    Kidney stones    Old myocardial infarct    Right inguinal hernia 04/19/2012   S/P CABG x 5 08/29/1984   LIMA to LAD,SVG to diagonal,SVG to OM,SVG to PDA and PLA   Tremor, essential     Past Surgical History:  Procedure Laterality Date   A-FLUTTER ABLATION N/A 03/09/2018   Procedure: A-FLUTTER ABLATION;  Surgeon: Marinus Maw, MD;  Location: [facility] INVASIVE CV LAB;  Service: Cardiovascular;  Laterality: N/A;   CHOLECYSTECTOMY  10/1990   CORONARY ARTERY BYPASS GRAFT  1986   x5 LIMA to LAD,SVG to diagonal,SVG to OM,SVG to PDA and PLA   CORONARY STENT PLACEMENT     INGUINAL HERNIA REPAIR  04/25/2012   right- laparoscopic   PACEMAKER IMPLANT N/A 03/09/2018   Procedure: PACEMAKER IMPLANT;  Surgeon: Marinus Maw, MD;  Location: [facility] INVASIVE CV LAB;  Service: Cardiovascular;  Laterality: N/A;   R/P Myoview  12/27/2008   no ischemia   TEE WITHOUT CARDIOVERSION N/A 03/09/2018   Procedure: TRANSESOPHAGEAL ECHOCARDIOGRAM (TEE);  Surgeon: Pricilla Riffle, MD;  Location: [facility]  ENDOSCOPY;  Service: Cardiovascular;  Laterality: N/A;  Bubble Study   US ECHOCARDIOGRAPHY  12/27/2008   concentric LVH,mild to mod MR, TR and pulmonary hypertension. EF 45-50%    Family History  Problem Relation Age of Onset   Cancer Father        bone   Cancer Brother        lymphnode    Social History   Socioeconomic History   Marital status: Widowed    Spouse name: Liborio Nixon   Number of children: 3   Years of education: 14   Highest education level: Not on file  Occupational History   Occupation: Retired     Comment: retired  Tobacco Use   Smoking status: Former    Current packs/day: 0.00    Types: Cigarettes    Quit date: 07/19/1970    Years since quitting: 52.9   Smokeless tobacco: Never  Vaping Use   Vaping status: Never Used  Substance and Sexual Activity   Alcohol use: No    Alcohol/week: 0.0 standard drinks of alcohol   Drug use: No   Sexual activity: Not Currently  Other Topics Concern   Not on file  Social History Narrative   ** Merged History Encounter **       Drinks 1-2 cups of coffee a day,  occasional coke    Social Drivers of Corporate investment banker Strain: Medium Risk (03/07/2018)   Overall Financial Resource Strain (CARDIA)    Difficulty of Paying Living Expenses: Somewhat hard  Food Insecurity: No Food Insecurity (03/07/2018)   Hunger Vital Sign    Worried About Running Out of Food in the Last Year: Never true    Ran Out of Food in the Last Year: Never true  Transportation Needs: No Transportation Needs (03/07/2018)   PRAPARE - Administrator, Civil Service (Medical): No    Lack of Transportation (Non-Medical): No  Physical Activity: Inactive (03/07/2018)   Exercise Vital Sign    Days of Exercise per Week: 0 days    Minutes of Exercise per Session: 0 min  Stress: Stress Concern Present (03/07/2018)   [employer] of Occupational Health - Occupational Stress Questionnaire    Feeling of Stress : To some extent  Social  Connections: Moderately Integrated (03/07/2018)   Social Connection and Isolation Panel [NHANES]    Frequency of Communication with Friends and Family: More than three times a week    Frequency of Social Gatherings with Friends and Family: Twice a week    Attends Religious Services: More than 4 times per year    Active Member of [employer] or Organizations: No    Attends Banker Meetings: Never    Marital Status: Married  Catering manager Violence: Not At Risk (03/07/2018)   Humiliation, Afraid, Rape, and Kick questionnaire    Fear of Current or Ex-Partner: No    Emotionally Abused: No    Physically Abused: No    Sexually Abused: No     Physical Exam   Vitals:   07/05/23 0630 07/05/23 0633  BP: (!) 123/56   Pulse: 61   Resp: 15   Temp:  97.6 F (36.4 C)  SpO2: 96%     CONSTITUTIONAL: Well-appearing, NAD NEURO/PSYCH:  Alert and oriented x 3, no focal deficits EYES:  eyes equal and reactive ENT/NECK:  no LAD, no JVD CARDIO: Regular rate, well-perfused, normal S1 and S2 PULM:  CTAB no wheezing or rhonchi GI/GU:  non-distended, non-tender MSK/SPINE:  No gross deformities, no edema SKIN: Laceration left scalp   *Additional and/or pertinent findings included in MDM below  Diagnostic and Interventional Summary    EKG Interpretation Date/Time:    Ventricular Rate:    PR Interval:    QRS Duration:    QT Interval:    QTC Calculation:   R Axis:      Text Interpretation:         Labs Reviewed - No data to display  CT HEAD WO CONTRAST ( )  Final Result    DG Shoulder Left    (Results Pending)    Medications  lidocaine (PF) (XYLOCAINE) 1 % injection 10 mL (10 mLs Infiltration Given 07/05/23 0612)     Procedures  /  Critical Care .Critical Care  Performed by: Sabas Sous, MD Authorized by: Sabas Sous, MD   Critical care provider statement:    Critical care time (minutes):  30   Critical care was necessary to treat or prevent imminent or  life-threatening deterioration of the following conditions:  Trauma (Level 2 trauma, initiation of trauma protocol)   Critical care was time spent personally by me on the following activities:  Development of treatment plan with patient or surrogate, discussions with consultants, evaluation of patient's response to treatment, examination of patient, ordering and review of laboratory  studies, ordering and review of radiographic studies, ordering and performing treatments and interventions, pulse oximetry, re-evaluation of patient's condition and review of old charts   ED Course and Medical Decision Making  Initial Impression and Ddx Will need CT head to exclude intracranial bleeding given patient's head trauma and anticoagulated status.  Will need scalp laceration repair.  Otherwise no indication for other testing, normal range of motion of the neck, lungs clear, no chest pain, no abdominal pain, no back pain.  Past medical/surgical history that increases complexity of ED encounter: Anticoagulated  Interpretation of Diagnostics I personally reviewed the shoulder x-ray and my interpretation is as follows: No fracture  CT head without bleeding, patient informed of remote infarct and advised PCP follow-up.  Patient Reassessment and Ultimate Disposition/Management     Awaiting formal radiology read of x-ray, anticipating discharge.  Patient management required discussion with the following services or consulting groups:  None  Complexity of Problems Addressed Acute illness or injury that poses threat of life of bodily function  Additional Data Reviewed and Analyzed Further history obtained from: None  Additional Factors Impacting ED Encounter Risk Consideration of hospitalization  Elmer Sow. Pilar Plate, MD [facility] Emergency Medicine [facility] [email or web address]  Final Clinical Impressions(s) / ED Diagnoses     ICD-10-CM   1. Injury of head, initial encounter   S09.90XA     2. Laceration of scalp, initial encounter  S01.01XA     3. Acute pain of left shoulder  M25.512       ED Discharge Orders     None        Discharge Instructions Discussed with and Provided to Patient:     Discharge Instructions      You were evaluated in the Emergency Department and after careful evaluation, we did not find any emergent condition requiring admission or further testing in the hospital.  Your exam/testing today is overall reassuring.  No signs of significant injuries.  Your staples will need to be removed in 10 to 14 days by healthcare professional.  Please return to the Emergency Department if you experience any worsening of your condition.   Thank you for allowing [place] to be a part of your care.       Sabas Sous, MD 07/05/23 [phone]

## 2023-07-05 NOTE — Progress Notes (Signed)
   07/05/23 0350  Spiritual Encounters  Type of Visit Initial  Care provided to: Pt not available  Referral source Trauma page  Reason for visit Trauma  OnCall Visit No   Chaplain responded to a level two trauma. The patient, Paul Frazier was attended to by the medical team.  No family is present. If a chaplain is requested someone will respond.   Valerie Roys [facility]  [phone]

## 2023-07-05 NOTE — Progress Notes (Signed)
Orthopedic Tech Progress Note Patient Details:  [name] Oct 25, 1932 [id number]  Patient ID: [name]., male   DOB: [birth year], 89 y.o.   MRN: [id number] I attended trauma page. Trinna Post 07/05/2023, 5:41 AM

## 2023-07-05 NOTE — ED Provider Notes (Signed)
Patient handed off to awaiting x-ray of his left shoulder after mechanical fall.  CT head is unremarkable.  He has had wound repair to his scalp.  X-ray of the left shoulder is unremarkable.  Will have him follow-up with his primary care doctor for staple removal.  Understands return precautions.  Discharged in good condition.  He has no complaints to my evaluation.  This chart was dictated using voice recognition software.  Despite best efforts to proofread,  errors can occur which can change the documentation meaning.    Virgina Norfolk, DO 07/05/23 [phone]

## 2023-07-05 NOTE — ED Triage Notes (Addendum)
Pt. Presents to the ED BIBA from [facility] for c/o fall. Pt. Was using urinal when he fell forward hitting his head on the wall and grab bar. Pt. Denies LOC but endorses taking thinners. Denies any neck or back pain at this time. Endorses left shoulder pain. CMS intact. GCS 15. Has pacemaker

## 2023-07-05 NOTE — Discharge Instructions (Addendum)
You were evaluated in the Emergency Department and after careful evaluation, we did not find any emergent condition requiring admission or further testing in the hospital.  Your exam/testing today is overall reassuring.  No signs of significant injuries.  Your staples will need to be removed in 10 to 14 days by healthcare professional.  Please return to the Emergency Department if you experience any worsening of your condition.   Thank you for allowing [place] to be a part of your care.

## 2023-07-06 DIAGNOSIS — S0101XA Laceration without foreign body of scalp, initial encounter: Secondary | ICD-10-CM | POA: Diagnosis not present

## 2023-07-06 DIAGNOSIS — W19XXXA Unspecified fall, initial encounter: Secondary | ICD-10-CM | POA: Diagnosis not present

## 2023-07-06 DIAGNOSIS — R42 Dizziness and giddiness: Secondary | ICD-10-CM | POA: Diagnosis not present

## 2023-07-06 DIAGNOSIS — G250 Essential tremor: Secondary | ICD-10-CM | POA: Diagnosis not present

## 2023-07-08 DIAGNOSIS — M6281 Muscle weakness (generalized): Secondary | ICD-10-CM | POA: Diagnosis not present

## 2023-07-08 DIAGNOSIS — M6258 Muscle wasting and atrophy, not elsewhere classified, other site: Secondary | ICD-10-CM | POA: Diagnosis not present

## 2023-07-08 DIAGNOSIS — R2689 Other abnormalities of gait and mobility: Secondary | ICD-10-CM | POA: Diagnosis not present

## 2023-07-11 DIAGNOSIS — R2689 Other abnormalities of gait and mobility: Secondary | ICD-10-CM | POA: Diagnosis not present

## 2023-07-11 DIAGNOSIS — M6258 Muscle wasting and atrophy, not elsewhere classified, other site: Secondary | ICD-10-CM | POA: Diagnosis not present

## 2023-07-11 DIAGNOSIS — M6281 Muscle weakness (generalized): Secondary | ICD-10-CM | POA: Diagnosis not present

## 2023-07-12 DIAGNOSIS — M6281 Muscle weakness (generalized): Secondary | ICD-10-CM | POA: Diagnosis not present

## 2023-07-12 DIAGNOSIS — M6258 Muscle wasting and atrophy, not elsewhere classified, other site: Secondary | ICD-10-CM | POA: Diagnosis not present

## 2023-07-12 DIAGNOSIS — R2689 Other abnormalities of gait and mobility: Secondary | ICD-10-CM | POA: Diagnosis not present

## 2023-07-15 DIAGNOSIS — R2689 Other abnormalities of gait and mobility: Secondary | ICD-10-CM | POA: Diagnosis not present

## 2023-07-15 DIAGNOSIS — M6281 Muscle weakness (generalized): Secondary | ICD-10-CM | POA: Diagnosis not present

## 2023-07-15 DIAGNOSIS — M6258 Muscle wasting and atrophy, not elsewhere classified, other site: Secondary | ICD-10-CM | POA: Diagnosis not present

## 2023-07-18 DIAGNOSIS — M6258 Muscle wasting and atrophy, not elsewhere classified, other site: Secondary | ICD-10-CM | POA: Diagnosis not present

## 2023-07-18 DIAGNOSIS — M6281 Muscle weakness (generalized): Secondary | ICD-10-CM | POA: Diagnosis not present

## 2023-07-18 DIAGNOSIS — R2689 Other abnormalities of gait and mobility: Secondary | ICD-10-CM | POA: Diagnosis not present

## 2023-07-19 DIAGNOSIS — M6281 Muscle weakness (generalized): Secondary | ICD-10-CM | POA: Diagnosis not present

## 2023-07-19 DIAGNOSIS — R2689 Other abnormalities of gait and mobility: Secondary | ICD-10-CM | POA: Diagnosis not present

## 2023-07-19 DIAGNOSIS — M6258 Muscle wasting and atrophy, not elsewhere classified, other site: Secondary | ICD-10-CM | POA: Diagnosis not present

## 2023-07-20 DIAGNOSIS — M6281 Muscle weakness (generalized): Secondary | ICD-10-CM | POA: Diagnosis not present

## 2023-07-20 DIAGNOSIS — R2689 Other abnormalities of gait and mobility: Secondary | ICD-10-CM | POA: Diagnosis not present

## 2023-07-20 DIAGNOSIS — M6258 Muscle wasting and atrophy, not elsewhere classified, other site: Secondary | ICD-10-CM | POA: Diagnosis not present

## 2023-07-21 DIAGNOSIS — M6258 Muscle wasting and atrophy, not elsewhere classified, other site: Secondary | ICD-10-CM | POA: Diagnosis not present

## 2023-07-21 DIAGNOSIS — M6281 Muscle weakness (generalized): Secondary | ICD-10-CM | POA: Diagnosis not present

## 2023-07-21 DIAGNOSIS — R2689 Other abnormalities of gait and mobility: Secondary | ICD-10-CM | POA: Diagnosis not present

## 2023-07-22 DIAGNOSIS — R2689 Other abnormalities of gait and mobility: Secondary | ICD-10-CM | POA: Diagnosis not present

## 2023-07-22 DIAGNOSIS — M6281 Muscle weakness (generalized): Secondary | ICD-10-CM | POA: Diagnosis not present

## 2023-07-22 DIAGNOSIS — M6258 Muscle wasting and atrophy, not elsewhere classified, other site: Secondary | ICD-10-CM | POA: Diagnosis not present

## 2023-07-25 DIAGNOSIS — R2689 Other abnormalities of gait and mobility: Secondary | ICD-10-CM | POA: Diagnosis not present

## 2023-07-25 DIAGNOSIS — M6281 Muscle weakness (generalized): Secondary | ICD-10-CM | POA: Diagnosis not present

## 2023-07-25 DIAGNOSIS — M6258 Muscle wasting and atrophy, not elsewhere classified, other site: Secondary | ICD-10-CM | POA: Diagnosis not present

## 2023-07-26 DIAGNOSIS — R2689 Other abnormalities of gait and mobility: Secondary | ICD-10-CM | POA: Diagnosis not present

## 2023-07-26 DIAGNOSIS — M6281 Muscle weakness (generalized): Secondary | ICD-10-CM | POA: Diagnosis not present

## 2023-07-26 DIAGNOSIS — M6258 Muscle wasting and atrophy, not elsewhere classified, other site: Secondary | ICD-10-CM | POA: Diagnosis not present

## 2023-07-27 DIAGNOSIS — M6281 Muscle weakness (generalized): Secondary | ICD-10-CM | POA: Diagnosis not present

## 2023-07-27 DIAGNOSIS — R2689 Other abnormalities of gait and mobility: Secondary | ICD-10-CM | POA: Diagnosis not present

## 2023-07-27 DIAGNOSIS — M6258 Muscle wasting and atrophy, not elsewhere classified, other site: Secondary | ICD-10-CM | POA: Diagnosis not present

## 2023-07-28 DIAGNOSIS — T698XXA Other specified effects of reduced temperature, initial encounter: Secondary | ICD-10-CM | POA: Diagnosis not present

## 2023-07-28 DIAGNOSIS — M4802 Spinal stenosis, cervical region: Secondary | ICD-10-CM | POA: Diagnosis not present

## 2023-07-28 DIAGNOSIS — G629 Polyneuropathy, unspecified: Secondary | ICD-10-CM | POA: Diagnosis not present

## 2023-07-28 DIAGNOSIS — G250 Essential tremor: Secondary | ICD-10-CM | POA: Diagnosis not present

## 2023-07-28 DIAGNOSIS — M6281 Muscle weakness (generalized): Secondary | ICD-10-CM | POA: Diagnosis not present

## 2023-07-28 DIAGNOSIS — R2689 Other abnormalities of gait and mobility: Secondary | ICD-10-CM | POA: Diagnosis not present

## 2023-07-28 DIAGNOSIS — M6258 Muscle wasting and atrophy, not elsewhere classified, other site: Secondary | ICD-10-CM | POA: Diagnosis not present

## 2023-08-01 DIAGNOSIS — R059 Cough, unspecified: Secondary | ICD-10-CM | POA: Diagnosis not present

## 2023-08-01 DIAGNOSIS — R0989 Other specified symptoms and signs involving the circulatory and respiratory systems: Secondary | ICD-10-CM | POA: Diagnosis not present

## 2023-08-01 DIAGNOSIS — J069 Acute upper respiratory infection, unspecified: Secondary | ICD-10-CM | POA: Diagnosis not present

## 2023-08-01 DIAGNOSIS — R062 Wheezing: Secondary | ICD-10-CM | POA: Diagnosis not present

## 2023-08-02 ENCOUNTER — Emergency Department (HOSPITAL_COMMUNITY): Payer: Medicare Other

## 2023-08-02 ENCOUNTER — Encounter (HOSPITAL_COMMUNITY): Payer: Self-pay

## 2023-08-02 ENCOUNTER — Other Ambulatory Visit: Payer: Self-pay

## 2023-08-02 ENCOUNTER — Emergency Department (HOSPITAL_COMMUNITY)
Admission: EM | Admit: 2023-08-02 | Discharge: 2023-08-02 | Disposition: A | Discharge status: Home / Self Care | Payer: Medicare Other | Attending: Emergency Medicine | Admitting: Emergency Medicine

## 2023-08-02 DIAGNOSIS — R0902 Hypoxemia: Secondary | ICD-10-CM | POA: Diagnosis not present

## 2023-08-02 DIAGNOSIS — R0602 Shortness of breath: Secondary | ICD-10-CM | POA: Insufficient documentation

## 2023-08-02 DIAGNOSIS — I509 Heart failure, unspecified: Secondary | ICD-10-CM | POA: Insufficient documentation

## 2023-08-02 DIAGNOSIS — Z7901 Long term (current) use of anticoagulants: Secondary | ICD-10-CM | POA: Insufficient documentation

## 2023-08-02 DIAGNOSIS — I110 Hypertensive heart disease with heart failure: Secondary | ICD-10-CM | POA: Diagnosis not present

## 2023-08-02 DIAGNOSIS — R059 Cough, unspecified: Secondary | ICD-10-CM | POA: Diagnosis not present

## 2023-08-02 DIAGNOSIS — R600 Localized edema: Secondary | ICD-10-CM | POA: Diagnosis not present

## 2023-08-02 DIAGNOSIS — Z950 Presence of cardiac pacemaker: Secondary | ICD-10-CM | POA: Diagnosis not present

## 2023-08-02 DIAGNOSIS — Z79899 Other long term (current) drug therapy: Secondary | ICD-10-CM | POA: Insufficient documentation

## 2023-08-02 DIAGNOSIS — B974 Respiratory syncytial virus as the cause of diseases classified elsewhere: Secondary | ICD-10-CM | POA: Diagnosis not present

## 2023-08-02 DIAGNOSIS — I517 Cardiomegaly: Secondary | ICD-10-CM | POA: Diagnosis not present

## 2023-08-02 DIAGNOSIS — Z20822 Contact with and (suspected) exposure to covid-19: Secondary | ICD-10-CM | POA: Diagnosis not present

## 2023-08-02 DIAGNOSIS — I2510 Atherosclerotic heart disease of native coronary artery without angina pectoris: Secondary | ICD-10-CM | POA: Insufficient documentation

## 2023-08-02 DIAGNOSIS — Z951 Presence of aortocoronary bypass graft: Secondary | ICD-10-CM | POA: Diagnosis not present

## 2023-08-02 DIAGNOSIS — B338 Other specified viral diseases: Secondary | ICD-10-CM

## 2023-08-02 DIAGNOSIS — R0600 Dyspnea, unspecified: Secondary | ICD-10-CM | POA: Diagnosis not present

## 2023-08-02 DIAGNOSIS — R062 Wheezing: Secondary | ICD-10-CM | POA: Diagnosis not present

## 2023-08-02 DIAGNOSIS — R0601 Orthopnea: Secondary | ICD-10-CM | POA: Diagnosis not present

## 2023-08-02 LAB — CBC WITH DIFFERENTIAL/PLATELET
Abs Immature Granulocytes: 0.03 10*3/uL (ref 0.00–0.07)
Basophils Absolute: 0 10*3/uL (ref 0.0–0.1)
Basophils Relative: 0 %
Eosinophils Absolute: 0.1 10*3/uL (ref 0.0–0.5)
Eosinophils Relative: 1 %
HCT: 40.3 % (ref 39.0–52.0)
Hemoglobin: 13 g/dL (ref 13.0–17.0)
Immature Granulocytes: 0 %
Lymphocytes Relative: 18 %
Lymphs Abs: 1.4 10*3/uL (ref 0.7–4.0)
MCH: 31.1 pg (ref 26.0–34.0)
MCHC: 32.3 g/dL (ref 30.0–36.0)
MCV: 96.4 fL (ref 80.0–100.0)
Monocytes Absolute: 1.1 10*3/uL — ABNORMAL HIGH (ref 0.1–1.0)
Monocytes Relative: 15 %
Neutro Abs: 5.1 10*3/uL (ref 1.7–7.7)
Neutrophils Relative %: 66 %
Platelets: 120 10*3/uL — ABNORMAL LOW (ref 150–400)
RBC: 4.18 MIL/uL — ABNORMAL LOW (ref 4.22–5.81)
RDW: 14 % (ref 11.5–15.5)
WBC: 7.7 10*3/uL (ref 4.0–10.5)
nRBC: 0 % (ref 0.0–0.2)

## 2023-08-02 LAB — BRAIN NATRIURETIC PEPTIDE: B Natriuretic Peptide: 1637.8 pg/mL — ABNORMAL HIGH (ref 0.0–100.0)

## 2023-08-02 LAB — BASIC METABOLIC PANEL
Anion gap: 9 (ref 5–15)
BUN: 17 mg/dL (ref 8–23)
CO2: 22 mmol/L (ref 22–32)
Calcium: 8.9 mg/dL (ref 8.9–10.3)
Chloride: 104 mmol/L (ref 98–111)
Creatinine, Ser: 1.05 mg/dL (ref 0.61–1.24)
GFR, Estimated: >60 mL/min (ref >60)
Glucose, Bld: 137 mg/dL — ABNORMAL HIGH (ref 70–99)
Potassium: 4.3 mmol/L (ref 3.5–5.1)
Sodium: 135 mmol/L (ref 135–145)

## 2023-08-02 LAB — TROPONIN I (HIGH SENSITIVITY): Troponin I (High Sensitivity): 20 ng/L — ABNORMAL HIGH (ref <18)

## 2023-08-02 LAB — RESP PANEL BY RT-PCR (RSV, FLU A&B, COVID)  RVPGX2
Influenza A by PCR: NEGATIVE
Influenza B by PCR: NEGATIVE
Resp Syncytial Virus by PCR: POSITIVE — AB
SARS Coronavirus 2 by RT PCR: NEGATIVE

## 2023-08-02 MED ORDER — IPRATROPIUM-ALBUTEROL 0.5-2.5 (3) MG/3ML IN SOLN
3.0000 mL | Freq: Once | RESPIRATORY_TRACT | Status: AC
  Administered 2023-08-02: 3 mL via RESPIRATORY_TRACT
  Filled 2023-08-02: qty 3

## 2023-08-02 MED ORDER — FUROSEMIDE 10 MG/ML IJ SOLN
20.0000 mg | Freq: Once | INTRAMUSCULAR | Status: AC
  Administered 2023-08-02: 20 mg via INTRAVENOUS
  Filled 2023-08-02: qty 2

## 2023-08-02 NOTE — Discharge Instructions (Addendum)
 You tested positive for RSV.  Your chest x-ray showed no evidence of pneumonia, no evidence of fluid on your lungs, continue to take your prescribed diuretic.  Recommend continue supportive care and management at your facility, Tylenol  as needed for headache and fever, continued oral rehydration.

## 2023-08-02 NOTE — ED Provider Notes (Signed)
 [place] EMERGENCY DEPARTMENT AT [facility] Provider Note   CSN: [id number] Arrival date & time: 08/02/23  9686     History  Chief Complaint  Patient presents with   Cough   Shortness of Breath    Paul Frazier. is a 89 y.o. male.  HPI   89 year old male with medical history significant for arthritis, hypertension, CAD status post CABG, hyperlipidemia presenting to the emergency department with cough and increased work of breathing.  The patient was found by EMS to have wheezing and slightly diminished in the upper and lower lung fields.  He was administered 2 DuoNebs.  He endorses mild dyspnea, slight worse when lying flat, mild bilateral lower extremity swelling.  He denies any chest pain at this time.  No fevers, states he has had a cough and shortness of breath for the past couple of days.  Home Medications Prior to Admission medications   Medication Sig Start Date End Date Taking? Authorizing Provider  acetaminophen  (TYLENOL ) 500 MG tablet Take 500 mg by mouth 2 (two) times daily as needed for moderate pain (pain score 4-6).    [provider]  amoxicillin (AMOXIL) 500 MG capsule Take 500 mg by mouth 3 (three) times daily. Patient not taking: Reported on 07/05/2023 06/30/23   [provider]  atorvastatin  (LIPITOR) 40 MG tablet Take 20 mg by mouth at bedtime. 06/28/23   [provider]  Azelastine HCl 137 MCG/SPRAY SOLN Place 1 spray into both nostrils at bedtime. 12/05/20   [provider]  cetirizine (ZYRTEC ALLERGY) 10 MG tablet Take 10 mg by mouth at bedtime.    [provider]  citalopram  (CELEXA ) 40 MG tablet Take 1 tablet (40 mg total) by mouth daily. 10/08/19 07/05/23  Craig Palma R, PA-C  DEXTRAN 70-HYPROMELLOSE, PF, OP Place 1 drop into both eyes as needed (Dry Eye).    [provider]  ELIQUIS  5 MG TABS tablet TAKE 1 TABLET BY MOUTH  TWICE DAILY 04/07/20   Croitoru, Mihai, MD  furosemide  (LASIX )  20 MG tablet Take 10 mg by mouth daily. 02/15/23   [provider]  LORazepam  (ATIVAN ) 0.5 MG tablet Take 0.5 mg by mouth 2 (two) times daily. 03/02/23   [provider]  meclizine  (ANTIVERT ) 50 MG tablet Take 25 mg by mouth 2 (two) times daily. 06/30/23   [provider]  Menthol, Topical Analgesic, (BIOFREEZE COOL THE PAIN) 4 % GEL Apply 1 Application topically daily as needed (Pain).    [provider]  nitroGLYCERIN  (NITROSTAT ) 0.4 MG SL tablet Place 1 tablet (0.4 mg total) under the tongue every 5 (five) minutes as needed for chest pain. 12/25/19   Tonita Fallow, MD  potassium chloride  SA (KLOR-CON ) 20 MEQ tablet Take 20 mEq by mouth 2 (two) times a week. Monday & Friday    [provider]  propranolol  ER (INDERAL  LA) 160 MG SR capsule Take 160 mg by mouth daily. Do not give if systolic blood pressure is less than 100 or HR less than 60 06/02/23   [provider]  senna-docusate (SENNA S) 8.6-50 MG tablet Take 2 tablets by mouth every other day.    [provider]  tamsulosin  (FLOMAX ) 0.4 MG CAPS capsule Take 0.4 mg by mouth 2 (two) times daily. 02/21/23   [provider]  White Petrolatum (VASELINE EX) Apply 1 Application topically at bedtime.    [provider]      Allergies    Erythromycin, Gabapentin ,  and Fluorouracil    Review of Systems   Review of Systems  Respiratory:  Positive for cough and shortness of breath.   All other systems reviewed and are negative.   Physical Exam Updated Vital Signs BP 132/70 (BP Location: Right Arm)   Pulse 64   Temp 98.3 F (36.8 C) (Oral)   Resp (!) 24   Ht 5' 5 (1.651 m)   Wt 81.2 kg   SpO2 98%   BMI 29.79 kg/m  Physical Exam Vitals and nursing note reviewed.  Constitutional:      General: He is not in acute distress.    Appearance: He is well-developed.  HENT:     Head: Normocephalic and atraumatic.  Eyes:     Conjunctiva/sclera: Conjunctivae normal.   Cardiovascular:     Rate and Rhythm: Normal rate and regular rhythm.     Heart sounds: No murmur heard. Pulmonary:     Effort: Pulmonary effort is normal. No respiratory distress.     Breath sounds: Normal breath sounds.  Abdominal:     Palpations: Abdomen is soft.     Tenderness: There is no abdominal tenderness.  Musculoskeletal:        General: No swelling.     Cervical back: Neck supple.     Right lower leg: Edema present.     Left lower leg: Edema present.     Comments: Trace edema noted bilaterally to the ankles  Skin:    General: Skin is warm and dry.     Capillary Refill: Capillary refill takes less than 2 seconds.  Neurological:     Mental Status: He is alert.  Psychiatric:        Mood and Affect: Mood normal.     ED Results / Procedures / Treatments   Labs (all labs ordered are listed, but only abnormal results are displayed) Labs Reviewed  RESP PANEL BY RT-PCR (RSV, FLU A&B, COVID)  RVPGX2 - Abnormal; Notable for the following components:      Result Value   Resp Syncytial Virus by PCR POSITIVE (*)    All other components within normal limits  BASIC METABOLIC PANEL - Abnormal; Notable for the following components:   Glucose, Bld 137 (*)    All other components within normal limits  CBC WITH DIFFERENTIAL/PLATELET - Abnormal; Notable for the following components:   RBC 4.18 (*)    Platelets 120 (*)    Monocytes Absolute 1.1 (*)    All other components within normal limits  BRAIN NATRIURETIC PEPTIDE - Abnormal; Notable for the following components:   B Natriuretic Peptide 1,637.8 (*)    All other components within normal limits  TROPONIN I (HIGH SENSITIVITY) - Abnormal; Notable for the following components:   Troponin I (High Sensitivity) 20 (*)    All other components within normal limits    EKG EKG Interpretation Date/Time:  Tuesday August 02 2023 03:20:43 EST Ventricular Rate:  60 PR Interval:  202 QRS Duration:  123 QT Interval:  455 QTC  Calculation: 455 R Axis:   -8  Text Interpretation: Sinus rhythm Left bundle branch block Confirmed by Jerrol Agent ([id number]) on 08/02/2023 3:28:17 AM  Radiology DG Chest 2 View Result Date: 08/02/2023 CLINICAL DATA:  Shortness of breath EXAM: CHEST - 2 VIEW COMPARISON:  07/17/2022 FINDINGS: Lungs are clear.  No pleural effusion or pneumothorax. Cardiomegaly. Left subclavian pacemaker. Postsurgical changes related to prior CABG. Median sternotomy. Visualized osseous structures are otherwise within normal limits. IMPRESSION: Cardiomegaly. No acute cardiopulmonary  disease. Electronically Signed   By: Pinkie Pebbles M.D.   On: 08/02/2023 03:54    Procedures Procedures    Medications Ordered in ED Medications  ipratropium-albuterol  (DUONEB) 0.5-2.5 (3) MG/3ML nebulizer solution 3 mL (3 mLs Nebulization Given 08/02/23 0327)  furosemide  (LASIX ) injection 20 mg (20 mg Intravenous Given 08/02/23 0439)    ED Course/ Medical Decision Making/ A&P Clinical Course as of 08/02/23 0536  Tue Aug 02, 2023  0425 Respiratory Syncytial Virus by PCR(!): POSITIVE [JL]    Clinical Course User Index [JL] Jerrol Agent, MD                                 Medical Decision Making Amount and/or Complexity of Data Reviewed Labs: ordered. Decision-making details documented in ED Course. Radiology: ordered.  Risk Prescription drug management.   89 year old male with medical history significant for arthritis, hypertension, CAD status post CABG, hyperlipidemia presenting to the emergency department with cough and increased work of breathing.  The patient was found by EMS to have wheezing and slightly diminished in the upper and lower lung fields.  He was administered 2 DuoNebs.  He endorses mild dyspnea, slight worse when lying flat, mild bilateral lower extremity swelling.  He denies any chest pain at this time.  No fevers, states he has had a cough and shortness of breath for the past couple of days.  On  arrival, the patient was afebrile, not tachycardic, mildly tachypneic RR 22, improved after DuoNebs, BP 124/74, saturating 99% on room air.  On my evaluation after breathing treatments, the patient had clear lungs to auscultation bilaterally and he was in no respiratory distress.  He ambulated in the emergency department with no desaturations.  Initially concern for viral infection, bacterial pneumonia, pneumothorax.  Lower concern for PE, ACS, other acute intrathoracic abnormality.  Patient without chest discomfort.  Considered CHF exacerbation although patient appears somewhat euvolemic, no rhonchi noted, only trace bilateral pitting edema noted.  COVID-19, influenza, RSV PCR testing was collected and the patient resulted positive for RSV.  A BNP was elevated to 1638.  Cardiac troponin was only nonspecifically mildly elevated at 20.  The patient Nuys any chest pain.  Do not think a repeat cardiac troponin is indicated at this time.  A BMP was unremarkable and a CBC revealed no leukocytosis or anemia.  A chest x-ray revealed cardiomegaly, no evidence of pulmonary edema.   IMPRESSION:  Cardiomegaly.    No acute cardiopulmonary disease.  The patient was administered a DuoNeb as well as IV Lasix  and was overall well-appearing, ambulatory in the emergency department with no desaturations noted, does not have an oxygen  requirement.  Discussed the care of the patient with the patient's son, Paul Frazier.  At this time, feel that the patient is stable for discharge back to his facility [facility].  Patient does not appear overtly volume overloaded, does not have an oxygen  requirement and has no evidence of pulmonary edema.  Feel that he is stable for continued outpatient care.  Recommended continued monitoring the patient's respiratory status, symptomatic management with Tylenol  and oral rehydration as needed, continued outpatient diuresis per his outpatient medications.  Final Clinical Impression(s) / ED  Diagnoses Final diagnoses:  RSV infection  Chronic congestive heart failure, unspecified heart failure type [facility])    Rx / DC Orders ED Discharge Orders     None         Jerrol Agent,  MD 08/02/23 9463

## 2023-08-02 NOTE — ED Triage Notes (Signed)
 Pt arrived via ems from [facility] for the c/o cough that worsened tonight. Wheezing rhonchi and diminished in upper and lower fields per ems. 2 duonebs given. Dyspnea present with movement, worse also when laying flat. No chest pain. 18g LAC.  147/71 Pacemaker present, 70-80p RA 96%.

## 2023-08-02 NOTE — ED Notes (Signed)
 Ambulated pt using walker to bathroom with assistance. Pt had slow gait, feeling slightly unbalanced compared to walking at home. Pt o2 stayed above 98, although pt respirations did increase

## 2023-08-03 DIAGNOSIS — I480 Paroxysmal atrial fibrillation: Secondary | ICD-10-CM | POA: Diagnosis not present

## 2023-08-03 DIAGNOSIS — J210 Acute bronchiolitis due to respiratory syncytial virus: Secondary | ICD-10-CM | POA: Diagnosis not present

## 2023-08-03 DIAGNOSIS — R5381 Other malaise: Secondary | ICD-10-CM | POA: Diagnosis not present

## 2023-08-03 DIAGNOSIS — R5383 Other fatigue: Secondary | ICD-10-CM | POA: Diagnosis not present

## 2023-08-06 DIAGNOSIS — M6258 Muscle wasting and atrophy, not elsewhere classified, other site: Secondary | ICD-10-CM | POA: Diagnosis not present

## 2023-08-06 DIAGNOSIS — R2689 Other abnormalities of gait and mobility: Secondary | ICD-10-CM | POA: Diagnosis not present

## 2023-08-06 DIAGNOSIS — M6281 Muscle weakness (generalized): Secondary | ICD-10-CM | POA: Diagnosis not present

## 2023-08-06 DIAGNOSIS — I10 Essential (primary) hypertension: Secondary | ICD-10-CM | POA: Diagnosis not present

## 2023-08-08 DIAGNOSIS — M6281 Muscle weakness (generalized): Secondary | ICD-10-CM | POA: Diagnosis not present

## 2023-08-08 DIAGNOSIS — R2689 Other abnormalities of gait and mobility: Secondary | ICD-10-CM | POA: Diagnosis not present

## 2023-08-08 DIAGNOSIS — M6258 Muscle wasting and atrophy, not elsewhere classified, other site: Secondary | ICD-10-CM | POA: Diagnosis not present

## 2023-08-08 NOTE — Addendum Note (Signed)
Addended by: Elease Etienne A on: 08/08/2023 11:56 AM   Modules accepted: Orders

## 2023-08-08 NOTE — Progress Notes (Signed)
Remote pacemaker transmission.   

## 2023-08-09 DIAGNOSIS — R2689 Other abnormalities of gait and mobility: Secondary | ICD-10-CM | POA: Diagnosis not present

## 2023-08-09 DIAGNOSIS — M6258 Muscle wasting and atrophy, not elsewhere classified, other site: Secondary | ICD-10-CM | POA: Diagnosis not present

## 2023-08-09 DIAGNOSIS — M6281 Muscle weakness (generalized): Secondary | ICD-10-CM | POA: Diagnosis not present

## 2023-08-10 DIAGNOSIS — M6281 Muscle weakness (generalized): Secondary | ICD-10-CM | POA: Diagnosis not present

## 2023-08-10 DIAGNOSIS — M6258 Muscle wasting and atrophy, not elsewhere classified, other site: Secondary | ICD-10-CM | POA: Diagnosis not present

## 2023-08-10 DIAGNOSIS — R2689 Other abnormalities of gait and mobility: Secondary | ICD-10-CM | POA: Diagnosis not present

## 2023-08-11 DIAGNOSIS — R2689 Other abnormalities of gait and mobility: Secondary | ICD-10-CM | POA: Diagnosis not present

## 2023-08-11 DIAGNOSIS — M6281 Muscle weakness (generalized): Secondary | ICD-10-CM | POA: Diagnosis not present

## 2023-08-11 DIAGNOSIS — M6258 Muscle wasting and atrophy, not elsewhere classified, other site: Secondary | ICD-10-CM | POA: Diagnosis not present

## 2023-08-16 DIAGNOSIS — R5383 Other fatigue: Secondary | ICD-10-CM | POA: Diagnosis not present

## 2023-08-16 DIAGNOSIS — Z8619 Personal history of other infectious and parasitic diseases: Secondary | ICD-10-CM | POA: Diagnosis not present

## 2023-08-16 DIAGNOSIS — J189 Pneumonia, unspecified organism: Secondary | ICD-10-CM | POA: Diagnosis not present

## 2023-08-16 DIAGNOSIS — R0989 Other specified symptoms and signs involving the circulatory and respiratory systems: Secondary | ICD-10-CM | POA: Diagnosis not present

## 2023-08-16 DIAGNOSIS — R059 Cough, unspecified: Secondary | ICD-10-CM | POA: Diagnosis not present

## 2023-08-19 DIAGNOSIS — M6281 Muscle weakness (generalized): Secondary | ICD-10-CM | POA: Diagnosis not present

## 2023-08-19 DIAGNOSIS — M6258 Muscle wasting and atrophy, not elsewhere classified, other site: Secondary | ICD-10-CM | POA: Diagnosis not present

## 2023-08-19 DIAGNOSIS — R2689 Other abnormalities of gait and mobility: Secondary | ICD-10-CM | POA: Diagnosis not present

## 2023-08-22 DIAGNOSIS — F331 Major depressive disorder, recurrent, moderate: Secondary | ICD-10-CM | POA: Diagnosis not present

## 2023-08-22 DIAGNOSIS — M6281 Muscle weakness (generalized): Secondary | ICD-10-CM | POA: Diagnosis not present

## 2023-08-22 DIAGNOSIS — M6258 Muscle wasting and atrophy, not elsewhere classified, other site: Secondary | ICD-10-CM | POA: Diagnosis not present

## 2023-08-22 DIAGNOSIS — F411 Generalized anxiety disorder: Secondary | ICD-10-CM | POA: Diagnosis not present

## 2023-08-22 DIAGNOSIS — R2689 Other abnormalities of gait and mobility: Secondary | ICD-10-CM | POA: Diagnosis not present

## 2023-08-23 DIAGNOSIS — M6258 Muscle wasting and atrophy, not elsewhere classified, other site: Secondary | ICD-10-CM | POA: Diagnosis not present

## 2023-08-23 DIAGNOSIS — M6281 Muscle weakness (generalized): Secondary | ICD-10-CM | POA: Diagnosis not present

## 2023-08-23 DIAGNOSIS — R2689 Other abnormalities of gait and mobility: Secondary | ICD-10-CM | POA: Diagnosis not present

## 2023-08-24 DIAGNOSIS — R2689 Other abnormalities of gait and mobility: Secondary | ICD-10-CM | POA: Diagnosis not present

## 2023-08-24 DIAGNOSIS — M6281 Muscle weakness (generalized): Secondary | ICD-10-CM | POA: Diagnosis not present

## 2023-08-24 DIAGNOSIS — M6258 Muscle wasting and atrophy, not elsewhere classified, other site: Secondary | ICD-10-CM | POA: Diagnosis not present

## 2023-08-25 DIAGNOSIS — M6281 Muscle weakness (generalized): Secondary | ICD-10-CM | POA: Diagnosis not present

## 2023-08-25 DIAGNOSIS — R2689 Other abnormalities of gait and mobility: Secondary | ICD-10-CM | POA: Diagnosis not present

## 2023-08-25 DIAGNOSIS — M6258 Muscle wasting and atrophy, not elsewhere classified, other site: Secondary | ICD-10-CM | POA: Diagnosis not present

## 2023-08-26 DIAGNOSIS — R2689 Other abnormalities of gait and mobility: Secondary | ICD-10-CM | POA: Diagnosis not present

## 2023-08-26 DIAGNOSIS — M6258 Muscle wasting and atrophy, not elsewhere classified, other site: Secondary | ICD-10-CM | POA: Diagnosis not present

## 2023-08-26 DIAGNOSIS — R0989 Other specified symptoms and signs involving the circulatory and respiratory systems: Secondary | ICD-10-CM | POA: Diagnosis not present

## 2023-08-26 DIAGNOSIS — M6281 Muscle weakness (generalized): Secondary | ICD-10-CM | POA: Diagnosis not present

## 2023-08-29 DIAGNOSIS — M6281 Muscle weakness (generalized): Secondary | ICD-10-CM | POA: Diagnosis not present

## 2023-08-29 DIAGNOSIS — R2689 Other abnormalities of gait and mobility: Secondary | ICD-10-CM | POA: Diagnosis not present

## 2023-08-29 DIAGNOSIS — M6258 Muscle wasting and atrophy, not elsewhere classified, other site: Secondary | ICD-10-CM | POA: Diagnosis not present

## 2023-08-30 DIAGNOSIS — R2689 Other abnormalities of gait and mobility: Secondary | ICD-10-CM | POA: Diagnosis not present

## 2023-08-30 DIAGNOSIS — M6281 Muscle weakness (generalized): Secondary | ICD-10-CM | POA: Diagnosis not present

## 2023-08-30 DIAGNOSIS — M6258 Muscle wasting and atrophy, not elsewhere classified, other site: Secondary | ICD-10-CM | POA: Diagnosis not present

## 2023-08-31 DIAGNOSIS — M6258 Muscle wasting and atrophy, not elsewhere classified, other site: Secondary | ICD-10-CM | POA: Diagnosis not present

## 2023-08-31 DIAGNOSIS — M6281 Muscle weakness (generalized): Secondary | ICD-10-CM | POA: Diagnosis not present

## 2023-08-31 DIAGNOSIS — R2689 Other abnormalities of gait and mobility: Secondary | ICD-10-CM | POA: Diagnosis not present

## 2023-09-01 DIAGNOSIS — M6258 Muscle wasting and atrophy, not elsewhere classified, other site: Secondary | ICD-10-CM | POA: Diagnosis not present

## 2023-09-01 DIAGNOSIS — M6281 Muscle weakness (generalized): Secondary | ICD-10-CM | POA: Diagnosis not present

## 2023-09-01 DIAGNOSIS — R2689 Other abnormalities of gait and mobility: Secondary | ICD-10-CM | POA: Diagnosis not present

## 2023-09-06 DIAGNOSIS — M6281 Muscle weakness (generalized): Secondary | ICD-10-CM | POA: Diagnosis not present

## 2023-09-06 DIAGNOSIS — M6258 Muscle wasting and atrophy, not elsewhere classified, other site: Secondary | ICD-10-CM | POA: Diagnosis not present

## 2023-09-06 DIAGNOSIS — R2689 Other abnormalities of gait and mobility: Secondary | ICD-10-CM | POA: Diagnosis not present

## 2023-09-15 DIAGNOSIS — K080 Exfoliation of teeth due to systemic causes: Secondary | ICD-10-CM | POA: Diagnosis not present

## 2023-09-19 DIAGNOSIS — I480 Paroxysmal atrial fibrillation: Secondary | ICD-10-CM | POA: Diagnosis not present

## 2023-09-19 DIAGNOSIS — Z7901 Long term (current) use of anticoagulants: Secondary | ICD-10-CM | POA: Diagnosis not present

## 2023-09-19 DIAGNOSIS — J189 Pneumonia, unspecified organism: Secondary | ICD-10-CM | POA: Diagnosis not present

## 2023-09-19 DIAGNOSIS — Z950 Presence of cardiac pacemaker: Secondary | ICD-10-CM | POA: Diagnosis not present

## 2023-09-20 DIAGNOSIS — L84 Corns and callosities: Secondary | ICD-10-CM | POA: Diagnosis not present

## 2023-09-20 DIAGNOSIS — L602 Onychogryphosis: Secondary | ICD-10-CM | POA: Diagnosis not present

## 2023-09-20 DIAGNOSIS — I739 Peripheral vascular disease, unspecified: Secondary | ICD-10-CM | POA: Diagnosis not present

## 2023-09-20 DIAGNOSIS — L603 Nail dystrophy: Secondary | ICD-10-CM | POA: Diagnosis not present

## 2023-09-27 ENCOUNTER — Ambulatory Visit (INDEPENDENT_AMBULATORY_CARE_PROVIDER_SITE_OTHER): Payer: Medicare Other

## 2023-09-27 DIAGNOSIS — I442 Atrioventricular block, complete: Secondary | ICD-10-CM

## 2023-09-28 LAB — CUP PACEART REMOTE DEVICE CHECK
Battery Remaining Longevity: 17 mo
Battery Voltage: 2.86 V
Brady Statistic AP VP Percent: 97.14 %
Brady Statistic AP VS Percent: 0.16 %
Brady Statistic AS VP Percent: 0.95 %
Brady Statistic AS VS Percent: 1.76 %
Brady Statistic RA Percent Paced: 98.68 %
Brady Statistic RV Percent Paced: 98.08 %
Date Time Interrogation Session: 20250310213559
Implantable Lead Connection Status: 753985
Implantable Lead Connection Status: 753985
Implantable Lead Implant Date: 20190822
Implantable Lead Implant Date: 20190822
Implantable Lead Location: 753859
Implantable Lead Location: 753860
Implantable Lead Model: 3830
Implantable Lead Model: 5076
Implantable Pulse Generator Implant Date: 20190822
Lead Channel Impedance Value: 304 Ohm
Lead Channel Impedance Value: 304 Ohm
Lead Channel Impedance Value: 361 Ohm
Lead Channel Impedance Value: 437 Ohm
Lead Channel Pacing Threshold Amplitude: 0.75 V
Lead Channel Pacing Threshold Amplitude: 1.375 V
Lead Channel Pacing Threshold Pulse Width: 0.4 ms
Lead Channel Pacing Threshold Pulse Width: 0.4 ms
Lead Channel Sensing Intrinsic Amplitude: 3.375 mV
Lead Channel Sensing Intrinsic Amplitude: 3.375 mV
Lead Channel Sensing Intrinsic Amplitude: 5 mV
Lead Channel Sensing Intrinsic Amplitude: 5 mV
Lead Channel Setting Pacing Amplitude: 1.5 V
Lead Channel Setting Pacing Amplitude: 2.5 V
Lead Channel Setting Pacing Pulse Width: 1 ms
Lead Channel Setting Sensing Sensitivity: 2.8 mV
Zone Setting Status: 755011
Zone Setting Status: 755011

## 2023-10-30 DIAGNOSIS — I700 Atherosclerosis of aorta: Secondary | ICD-10-CM | POA: Diagnosis not present

## 2023-10-30 DIAGNOSIS — M1990 Unspecified osteoarthritis, unspecified site: Secondary | ICD-10-CM | POA: Diagnosis not present

## 2023-10-30 DIAGNOSIS — M545 Low back pain, unspecified: Secondary | ICD-10-CM | POA: Diagnosis not present

## 2023-10-30 DIAGNOSIS — M6259 Muscle wasting and atrophy, not elsewhere classified, multiple sites: Secondary | ICD-10-CM | POA: Diagnosis not present

## 2023-10-30 DIAGNOSIS — M6258 Muscle wasting and atrophy, not elsewhere classified, other site: Secondary | ICD-10-CM | POA: Diagnosis not present

## 2023-10-30 DIAGNOSIS — Z950 Presence of cardiac pacemaker: Secondary | ICD-10-CM | POA: Diagnosis not present

## 2023-10-31 DIAGNOSIS — M545 Low back pain, unspecified: Secondary | ICD-10-CM | POA: Diagnosis not present

## 2023-10-31 DIAGNOSIS — I700 Atherosclerosis of aorta: Secondary | ICD-10-CM | POA: Diagnosis not present

## 2023-10-31 DIAGNOSIS — M6258 Muscle wasting and atrophy, not elsewhere classified, other site: Secondary | ICD-10-CM | POA: Diagnosis not present

## 2023-10-31 DIAGNOSIS — M6259 Muscle wasting and atrophy, not elsewhere classified, multiple sites: Secondary | ICD-10-CM | POA: Diagnosis not present

## 2023-10-31 DIAGNOSIS — M1990 Unspecified osteoarthritis, unspecified site: Secondary | ICD-10-CM | POA: Diagnosis not present

## 2023-10-31 DIAGNOSIS — Z950 Presence of cardiac pacemaker: Secondary | ICD-10-CM | POA: Diagnosis not present

## 2023-11-02 DIAGNOSIS — I700 Atherosclerosis of aorta: Secondary | ICD-10-CM | POA: Diagnosis not present

## 2023-11-02 DIAGNOSIS — M6258 Muscle wasting and atrophy, not elsewhere classified, other site: Secondary | ICD-10-CM | POA: Diagnosis not present

## 2023-11-02 DIAGNOSIS — M545 Low back pain, unspecified: Secondary | ICD-10-CM | POA: Diagnosis not present

## 2023-11-02 DIAGNOSIS — Z950 Presence of cardiac pacemaker: Secondary | ICD-10-CM | POA: Diagnosis not present

## 2023-11-02 DIAGNOSIS — M1990 Unspecified osteoarthritis, unspecified site: Secondary | ICD-10-CM | POA: Diagnosis not present

## 2023-11-02 DIAGNOSIS — M6259 Muscle wasting and atrophy, not elsewhere classified, multiple sites: Secondary | ICD-10-CM | POA: Diagnosis not present

## 2023-11-03 DIAGNOSIS — M545 Low back pain, unspecified: Secondary | ICD-10-CM | POA: Diagnosis not present

## 2023-11-03 DIAGNOSIS — M1990 Unspecified osteoarthritis, unspecified site: Secondary | ICD-10-CM | POA: Diagnosis not present

## 2023-11-03 DIAGNOSIS — Z950 Presence of cardiac pacemaker: Secondary | ICD-10-CM | POA: Diagnosis not present

## 2023-11-03 DIAGNOSIS — M6259 Muscle wasting and atrophy, not elsewhere classified, multiple sites: Secondary | ICD-10-CM | POA: Diagnosis not present

## 2023-11-03 DIAGNOSIS — M6258 Muscle wasting and atrophy, not elsewhere classified, other site: Secondary | ICD-10-CM | POA: Diagnosis not present

## 2023-11-03 DIAGNOSIS — I700 Atherosclerosis of aorta: Secondary | ICD-10-CM | POA: Diagnosis not present

## 2023-11-04 DIAGNOSIS — M1990 Unspecified osteoarthritis, unspecified site: Secondary | ICD-10-CM | POA: Diagnosis not present

## 2023-11-04 DIAGNOSIS — I700 Atherosclerosis of aorta: Secondary | ICD-10-CM | POA: Diagnosis not present

## 2023-11-04 DIAGNOSIS — M6259 Muscle wasting and atrophy, not elsewhere classified, multiple sites: Secondary | ICD-10-CM | POA: Diagnosis not present

## 2023-11-04 DIAGNOSIS — Z950 Presence of cardiac pacemaker: Secondary | ICD-10-CM | POA: Diagnosis not present

## 2023-11-04 DIAGNOSIS — M6258 Muscle wasting and atrophy, not elsewhere classified, other site: Secondary | ICD-10-CM | POA: Diagnosis not present

## 2023-11-04 DIAGNOSIS — M545 Low back pain, unspecified: Secondary | ICD-10-CM | POA: Diagnosis not present

## 2023-11-07 DIAGNOSIS — M6259 Muscle wasting and atrophy, not elsewhere classified, multiple sites: Secondary | ICD-10-CM | POA: Diagnosis not present

## 2023-11-07 DIAGNOSIS — I700 Atherosclerosis of aorta: Secondary | ICD-10-CM | POA: Diagnosis not present

## 2023-11-07 DIAGNOSIS — M6258 Muscle wasting and atrophy, not elsewhere classified, other site: Secondary | ICD-10-CM | POA: Diagnosis not present

## 2023-11-07 DIAGNOSIS — M1990 Unspecified osteoarthritis, unspecified site: Secondary | ICD-10-CM | POA: Diagnosis not present

## 2023-11-07 DIAGNOSIS — Z950 Presence of cardiac pacemaker: Secondary | ICD-10-CM | POA: Diagnosis not present

## 2023-11-07 DIAGNOSIS — M545 Low back pain, unspecified: Secondary | ICD-10-CM | POA: Diagnosis not present

## 2023-11-08 DIAGNOSIS — M6259 Muscle wasting and atrophy, not elsewhere classified, multiple sites: Secondary | ICD-10-CM | POA: Diagnosis not present

## 2023-11-08 DIAGNOSIS — Z950 Presence of cardiac pacemaker: Secondary | ICD-10-CM | POA: Diagnosis not present

## 2023-11-08 DIAGNOSIS — M6258 Muscle wasting and atrophy, not elsewhere classified, other site: Secondary | ICD-10-CM | POA: Diagnosis not present

## 2023-11-08 DIAGNOSIS — M545 Low back pain, unspecified: Secondary | ICD-10-CM | POA: Diagnosis not present

## 2023-11-08 DIAGNOSIS — M1990 Unspecified osteoarthritis, unspecified site: Secondary | ICD-10-CM | POA: Diagnosis not present

## 2023-11-08 DIAGNOSIS — I700 Atherosclerosis of aorta: Secondary | ICD-10-CM | POA: Diagnosis not present

## 2023-11-09 DIAGNOSIS — I700 Atherosclerosis of aorta: Secondary | ICD-10-CM | POA: Diagnosis not present

## 2023-11-09 DIAGNOSIS — Z950 Presence of cardiac pacemaker: Secondary | ICD-10-CM | POA: Diagnosis not present

## 2023-11-09 DIAGNOSIS — M1990 Unspecified osteoarthritis, unspecified site: Secondary | ICD-10-CM | POA: Diagnosis not present

## 2023-11-09 DIAGNOSIS — M6258 Muscle wasting and atrophy, not elsewhere classified, other site: Secondary | ICD-10-CM | POA: Diagnosis not present

## 2023-11-09 DIAGNOSIS — M545 Low back pain, unspecified: Secondary | ICD-10-CM | POA: Diagnosis not present

## 2023-11-10 ENCOUNTER — Observation Stay (HOSPITAL_COMMUNITY)

## 2023-11-10 ENCOUNTER — Observation Stay (HOSPITAL_COMMUNITY)
Admission: EM | Admit: 2023-11-10 | Discharge: 2023-11-11 | Disposition: A | Discharge status: Home / Self Care | Attending: Internal Medicine | Admitting: Internal Medicine

## 2023-11-10 ENCOUNTER — Other Ambulatory Visit: Payer: Self-pay

## 2023-11-10 ENCOUNTER — Encounter (HOSPITAL_COMMUNITY): Payer: Self-pay | Admitting: Internal Medicine

## 2023-11-10 DIAGNOSIS — I110 Hypertensive heart disease with heart failure: Secondary | ICD-10-CM | POA: Insufficient documentation

## 2023-11-10 DIAGNOSIS — H3412 Central retinal artery occlusion, left eye: Secondary | ICD-10-CM | POA: Diagnosis not present

## 2023-11-10 DIAGNOSIS — H53132 Sudden visual loss, left eye: Secondary | ICD-10-CM | POA: Diagnosis not present

## 2023-11-10 DIAGNOSIS — E785 Hyperlipidemia, unspecified: Secondary | ICD-10-CM | POA: Insufficient documentation

## 2023-11-10 DIAGNOSIS — I10 Essential (primary) hypertension: Secondary | ICD-10-CM | POA: Diagnosis not present

## 2023-11-10 DIAGNOSIS — I5040 Unspecified combined systolic (congestive) and diastolic (congestive) heart failure: Secondary | ICD-10-CM | POA: Insufficient documentation

## 2023-11-10 DIAGNOSIS — I480 Paroxysmal atrial fibrillation: Secondary | ICD-10-CM | POA: Diagnosis not present

## 2023-11-10 DIAGNOSIS — H34232 Retinal artery branch occlusion, left eye: Secondary | ICD-10-CM | POA: Diagnosis not present

## 2023-11-10 DIAGNOSIS — R7303 Prediabetes: Secondary | ICD-10-CM | POA: Diagnosis not present

## 2023-11-10 DIAGNOSIS — N400 Enlarged prostate without lower urinary tract symptoms: Secondary | ICD-10-CM | POA: Diagnosis not present

## 2023-11-10 DIAGNOSIS — R972 Elevated prostate specific antigen [PSA]: Secondary | ICD-10-CM | POA: Insufficient documentation

## 2023-11-10 DIAGNOSIS — I63232 Cerebral infarction due to unspecified occlusion or stenosis of left carotid arteries: Secondary | ICD-10-CM

## 2023-11-10 DIAGNOSIS — H547 Unspecified visual loss: Secondary | ICD-10-CM | POA: Diagnosis not present

## 2023-11-10 DIAGNOSIS — H53482 Generalized contraction of visual field, left eye: Secondary | ICD-10-CM | POA: Diagnosis not present

## 2023-11-10 DIAGNOSIS — Z87891 Personal history of nicotine dependence: Secondary | ICD-10-CM | POA: Insufficient documentation

## 2023-11-10 DIAGNOSIS — R2681 Unsteadiness on feet: Secondary | ICD-10-CM | POA: Diagnosis not present

## 2023-11-10 DIAGNOSIS — R69 Illness, unspecified: Secondary | ICD-10-CM | POA: Diagnosis not present

## 2023-11-10 DIAGNOSIS — I6501 Occlusion and stenosis of right vertebral artery: Secondary | ICD-10-CM | POA: Diagnosis not present

## 2023-11-10 DIAGNOSIS — I6523 Occlusion and stenosis of bilateral carotid arteries: Secondary | ICD-10-CM | POA: Diagnosis not present

## 2023-11-10 DIAGNOSIS — Z79899 Other long term (current) drug therapy: Secondary | ICD-10-CM | POA: Diagnosis not present

## 2023-11-10 DIAGNOSIS — H34212 Partial retinal artery occlusion, left eye: Secondary | ICD-10-CM | POA: Diagnosis not present

## 2023-11-10 DIAGNOSIS — E010 Iodine-deficiency related diffuse (endemic) goiter: Secondary | ICD-10-CM | POA: Diagnosis not present

## 2023-11-10 DIAGNOSIS — H538 Other visual disturbances: Secondary | ICD-10-CM | POA: Diagnosis not present

## 2023-11-10 LAB — COMPREHENSIVE METABOLIC PANEL WITH GFR
ALT: 22 U/L (ref 0–44)
AST: 25 U/L (ref 15–41)
Albumin: 3.7 g/dL (ref 3.5–5.0)
Alkaline Phosphatase: 82 U/L (ref 38–126)
Anion gap: 8 (ref 5–15)
BUN: 10 mg/dL (ref 8–23)
CO2: 28 mmol/L (ref 22–32)
Calcium: 9.2 mg/dL (ref 8.9–10.3)
Chloride: 102 mmol/L (ref 98–111)
Creatinine, Ser: 0.87 mg/dL (ref 0.61–1.24)
GFR, Estimated: >60 mL/min (ref >60)
Glucose, Bld: 108 mg/dL — ABNORMAL HIGH (ref 70–99)
Potassium: 4.7 mmol/L (ref 3.5–5.1)
Sodium: 138 mmol/L (ref 135–145)
Total Bilirubin: 0.9 mg/dL (ref 0.0–1.2)
Total Protein: 6.8 g/dL (ref 6.5–8.1)

## 2023-11-10 LAB — CBC
HCT: 46 % (ref 39.0–52.0)
Hemoglobin: 14.8 g/dL (ref 13.0–17.0)
MCH: 31 pg (ref 26.0–34.0)
MCHC: 32.2 g/dL (ref 30.0–36.0)
MCV: 96.4 fL (ref 80.0–100.0)
Platelets: 154 10*3/uL (ref 150–400)
RBC: 4.77 MIL/uL (ref 4.22–5.81)
RDW: 15.7 % — ABNORMAL HIGH (ref 11.5–15.5)
WBC: 8.2 10*3/uL (ref 4.0–10.5)
nRBC: 0 % (ref 0.0–0.2)

## 2023-11-10 LAB — PROTIME-INR
INR: 1.3 — ABNORMAL HIGH (ref 0.8–1.2)
Prothrombin Time: 16.6 s — ABNORMAL HIGH (ref 11.4–15.2)

## 2023-11-10 LAB — SEDIMENTATION RATE: Sed Rate: 1 mm/h (ref 0–16)

## 2023-11-10 MED ORDER — IOHEXOL 350 MG/ML SOLN
75.0000 mL | Freq: Once | INTRAVENOUS | Status: AC | PRN
Start: 2023-11-10 — End: 2023-11-10
  Administered 2023-11-10: 75 mL via INTRAVENOUS

## 2023-11-10 MED ORDER — ATORVASTATIN CALCIUM 10 MG PO TABS
20.0000 mg | ORAL_TABLET | Freq: Every day | ORAL | Status: DC
  Administered 2023-11-10: 20 mg via ORAL
  Filled 2023-11-10: qty 2

## 2023-11-10 MED ORDER — LORAZEPAM 0.5 MG PO TABS
0.2500 mg | ORAL_TABLET | Freq: Two times a day (BID) | ORAL | Status: DC
  Administered 2023-11-10 – 2023-11-11 (×2): 0.25 mg via ORAL
  Filled 2023-11-10 (×2): qty 1

## 2023-11-10 MED ORDER — CITALOPRAM HYDROBROMIDE 10 MG PO TABS
40.0000 mg | ORAL_TABLET | Freq: Every day | ORAL | Status: DC
  Administered 2023-11-10 – 2023-11-11 (×2): 40 mg via ORAL
  Filled 2023-11-10 (×2): qty 4

## 2023-11-10 MED ORDER — POLYVINYL ALCOHOL 1.4 % OP SOLN: 1.0000 [drp] | OPHTHALMIC | Status: DC | PRN

## 2023-11-10 MED ORDER — FUROSEMIDE 20 MG PO TABS
10.0000 mg | ORAL_TABLET | Freq: Every day | ORAL | Status: DC
  Administered 2023-11-11: 10 mg via ORAL
  Filled 2023-11-10: qty 1

## 2023-11-10 MED ORDER — ACETAMINOPHEN 650 MG RE SUPP: 650.0000 mg | Freq: Four times a day (QID) | RECTAL | Status: DC | PRN

## 2023-11-10 MED ORDER — APIXABAN 5 MG PO TABS
5.0000 mg | ORAL_TABLET | Freq: Two times a day (BID) | ORAL | Status: DC
  Administered 2023-11-11 (×2): 5 mg via ORAL
  Filled 2023-11-10 (×2): qty 1

## 2023-11-10 MED ORDER — ACETAMINOPHEN 325 MG PO TABS
650.0000 mg | ORAL_TABLET | Freq: Four times a day (QID) | ORAL | Status: DC | PRN
  Administered 2023-11-11: 650 mg via ORAL
  Filled 2023-11-10: qty 2

## 2023-11-10 MED ORDER — TAMSULOSIN HCL 0.4 MG PO CAPS
0.4000 mg | ORAL_CAPSULE | Freq: Two times a day (BID) | ORAL | Status: DC
  Administered 2023-11-10 – 2023-11-11 (×2): 0.4 mg via ORAL
  Filled 2023-11-10 (×2): qty 1

## 2023-11-10 NOTE — Consult Note (Signed)
 NEUROLOGY CONSULT NOTE   Date of service: November 10, 2023 Patient Name: Paul Frazier. MRN:  [id number] DOB:  06-12-1933 Chief Complaint: "Left CRAO" Requesting Provider: Cherylene Corrente, MD  History of Present Illness  Paul Frazier. is a 89 y.o. male with hx of arthritis, CAD status post CABG, hyperlipidemia, hypertension, prior MI status post pacemaker placement and essential tremor who presents with about 2 to 3-week history of blurred vision in the left eye.  He reports that his symptoms started about 3 weeks ago with spot that he describes as cloudy and dark vision.  He went to see an ophthalmologist and had a dilated left eye exam and was sent to the ED with symptomatic multi arterial retinal occlusion.  No nodules consulted for further evaluation and workup.  Patient denies any prior history of strokes, no family history of strokes.  He denies any history of diabetes.  He is on Eliquis  5 mg twice daily.  Neurology notes from 03/25/2020, documented infrequent episodes of atrial fibrillation with overall burden less than 0.1%.  Patient had a CT angio the head and neck with and without contrast which demonstrated 60% proximal left ICA stenosis due to predominantly noncalcified plaque.  In addition, he was noted to have a atherosclerotic web at the left carotid bifurcation.  LKW: 3 weeks ago Modified rankin score: 0-Completely asymptomatic and back to baseline post- stroke IV Thrombolysis: Not offered, outside the window. EVT: Not offered, outside the window and too mild to treat.    NIHSS components Score: Comment  1a Level of Conscious 0[x]  1[]  2[]  3[]      1b LOC Questions 0[x]  1[]  2[]       1c LOC Commands 0[x]  1[]  2[]       2 Best Gaze 0[x]  1[]  2[]       3 Visual 0[x]  1[]  2[]  3[]      4 Facial Palsy 0[x]  1[]  2[]  3[]      5a Motor Arm - left 0[x]  1[]  2[]  3[]  4[]  UN[]    5b Motor Arm - Right 0[x]  1[]  2[]  3[]  4[]  UN[]    6a Motor Leg - Left 0[x]  1[]  2[]  3[]  4[]  UN[]    6b Motor  Leg - Right 0[x]  1[]  2[]  3[]  4[]  UN[]    7 Limb Ataxia 0[x]  1[]  2[]  3[]  UN[]     8 Sensory 0[x]  1[]  2[]  UN[]      9 Best Language 0[x]  1[]  2[]  3[]      10 Dysarthria 0[x]  1[]  2[]  UN[]      11 Extinct. and Inattention 0[x]  1[]  2[]       TOTAL: 0      ROS  Comprehensive ROS performed and pertinent positives documented in HPI   Past History   Past Medical History:  Diagnosis Date   Arthritis    CAD (coronary artery disease)    Cancer (HCC)    Hyperlipidemia    Hypertension    Kidney stones    Old myocardial infarct    Right inguinal hernia 04/19/2012   S/P CABG x 5 08/29/1984   LIMA to LAD,SVG to diagonal,SVG to OM,SVG to PDA and PLA   Tremor, essential     Past Surgical History:  Procedure Laterality Date   A-FLUTTER ABLATION N/A 03/09/2018   Procedure: A-FLUTTER ABLATION;  Surgeon: Tammie Fall, MD;  Location: [facility];  Service: Cardiovascular;  Laterality: N/A;   CHOLECYSTECTOMY  10/1990   CORONARY ARTERY BYPASS GRAFT  1986   x5 LIMA to LAD,SVG to diagonal,SVG to OM,SVG to  PDA and PLA   CORONARY STENT PLACEMENT     INGUINAL HERNIA REPAIR  04/25/2012   right- laparoscopic   PACEMAKER IMPLANT N/A 03/09/2018   Procedure: PACEMAKER IMPLANT;  Surgeon: Tammie Fall, MD;  Location: [facility];  Service: Cardiovascular;  Laterality: N/A;   R/P Myoview   12/27/2008   no ischemia   TEE WITHOUT CARDIOVERSION N/A 03/09/2018   Procedure: TRANSESOPHAGEAL ECHOCARDIOGRAM (TEE);  Surgeon: Elmyra Haggard, MD;  Location: [facility];  Service: Cardiovascular;  Laterality: N/A;  Bubble Study   US  ECHOCARDIOGRAPHY  12/27/2008   concentric LVH,mild to mod MR, TR and pulmonary hypertension. EF 45-50%    Family History: Family History  Problem Relation Age of Onset   Cancer Father        bone   Cancer Brother        lymphnode    Social History  reports that he quit smoking about 53 years ago. His smoking use included cigarettes. He has never used smokeless tobacco. He  reports that he does not drink alcohol  and does not use drugs.  Allergies  Allergen Reactions   Erythromycin Nausea Only   Gabapentin  Other (See Comments)   Fluorouracil Rash and Other (See Comments)    Mouth ulcers    Medications   Current Facility-Administered Medications:    acetaminophen  (TYLENOL ) tablet 650 mg, 650 mg, Oral, Q6H PRN **OR** acetaminophen  (TYLENOL ) suppository 650 mg, 650 mg, Rectal, Q6H PRN, Atway, Rayann N, DO   atorvastatin  (LIPITOR) tablet 20 mg, 20 mg, Oral, QHS, Atway, Rayann N, DO, 20 mg at 11/10/23 2130   citalopram  (CELEXA ) tablet 40 mg, 40 mg, Oral, Daily, Atway, Rayann N, DO, 40 mg at 11/10/23 2013   [START ON 11/11/2023] furosemide  (LASIX ) tablet 10 mg, 10 mg, Oral, Daily, Atway, Rayann N, DO   LORazepam  (ATIVAN ) tablet 0.25 mg, 0.25 mg, Oral, BID, Atway, Rayann N, DO, 0.25 mg at 11/10/23 2130   polyvinyl alcohol  (LIQUIFILM TEARS) 1.4 % ophthalmic solution 1 drop, 1 drop, Both Eyes, PRN, Atway, Rayann N, DO   tamsulosin  (FLOMAX ) capsule 0.4 mg, 0.4 mg, Oral, BID, Atway, Rayann N, DO, 0.4 mg at 11/10/23 2013  Vitals   Vitals:   11/10/23 1534 11/10/23 1945 11/10/23 2100 11/10/23 2125  BP: (!) 154/89 134/69 (!) 156/83 (!) 142/70  Pulse: 81 (!) 59 66 61  Resp: 16 16 19 18   Temp: 97.8 F (36.6 C)   99 F (37.2 C)  TempSrc:    Oral  SpO2: 100% 99% 100% 99%  Weight:      Height:        Body mass index is 29.79 kg/m.  Physical Exam   General: Laying comfortably in bed; in no acute distress.  HENT: Normal oropharynx and mucosa. Normal external appearance of ears and nose.  Neck: Supple, no pain or tenderness  CV: No JVD. No peripheral edema.  Pulmonary: Symmetric Chest rise. Normal respiratory effort.  Abdomen: Soft to touch, non-tender.  Ext: No cyanosis, edema, or deformity  Skin: No rash. Normal palpation of skin.   Musculoskeletal: Normal digits and nails by inspection. No clubbing.   Neurologic Examination  Mental status/Cognition: Alert,  oriented to self, place, month and year, good attention.  Speech/language: Fluent, comprehension intact, object naming intact, repetition intact.  Cranial nerves:   CN II Left pupil about 4 mm, right pupil about 2 mm.  I suspect that the anisocoria secondary to left pupillary dilation by ophthalmology today.  Both pupils are reactive  to light, no VF deficits    CN III,IV,VI EOM intact, no gaze preference or deviation, no nystagmus    CN V normal sensation in V1, V2, and V3 segments bilaterally    CN VII no asymmetry, no nasolabial fold flattening    CN VIII normal hearing to speech    CN IX & X normal palatal elevation, no uvular deviation    CN XI 5/5 head turn and 5/5 shoulder shrug bilaterally    CN XII midline tongue protrusion    Motor:  Muscle bulk: Normal, tone normal, pronator drift none tremor none Mvmt Root Nerve  Muscle Right Left Comments  SA C5/6 Ax Deltoid 5 5   EF C5/6 Mc Biceps 5 5   EE C6/7/8 Rad Triceps 5 5   WF C6/7 Med FCR     WE C7/8 PIN ECU     F Ab C8/T1 U ADM/FDI 5 5   HF L1/2/3 Fem Illopsoas 5 5   KE L2/3/4 Fem Quad 5 5   DF L4/5 D Peron Tib Ant 5 5   PF S1/2 Tibial Grc/Sol 5 5    Sensation:  Light touch Intact throughout   Pin prick    Temperature    Vibration   Proprioception    Coordination/Complex Motor:  - Finger to Nose intact bilaterally - Heel to shin intact bilaterally - Rapid alternating movement are normal - Gait: Deferred for patient safety. Labs/Imaging/Neurodiagnostic studies   CBC:  Recent Labs  Lab 2023-11-12 1334  WBC 8.2  HGB 14.8  HCT 46.0  MCV 96.4  PLT 154   Basic Metabolic Panel:  Lab Results  Component Value Date   NA 138 11-12-2023   K 4.7 12-Nov-2023   CO2 28 2023/11/12   GLUCOSE 108 (H) 2023/11/12   BUN 10 Nov 12, 2023   CREATININE 0.87 November 12, 2023   CALCIUM  9.2 Nov 12, 2023   GFRNONAA >60 11-12-2023   GFRAA 70 05/28/2020   Lipid Panel:  Lab Results  Component Value Date   LDLCALC 68 05/28/2020   HgbA1c:   Lab Results  Component Value Date   HGBA1C 6.4 (H) 08/26/2020   Urine Drug Screen: No results found for: "LABOPIA", "COCAINSCRNUR", "LABBENZ", "AMPHETMU", "THCU", "LABBARB"  Alcohol  Level No results found for: "ETH" INR  Lab Results  Component Value Date   INR 1.3 (H) 11/12/2023   APTT No results found for: "APTT" AED levels: No results found for: "PHENYTOIN", "ZONISAMIDE", "LAMOTRIGINE", "LEVETIRACETA"  CT Head without contrast(Personally reviewed): CTH was negative for a large hypodensity concerning for a large territory infarct or hyperdensity concerning for an ICH  CT angio Head and Neck with contrast(Personally reviewed): 1. No intracranial arterial occlusion. 2. Occlusion of the right vertebral artery V1 and proximal V2 segments. 3. 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 Approximately 60% stenosis of the proximal left ICA due to predominantly noncalcified plaque. 5. Severe stenosis of the proximal right external carotid artery. 6. Thyromegaly.  MRI Brain(Personally reviewed): Pending  ASSESSMENT   Harbert Fitterer. is a 89 y.o. male with hx of arthritis, atrial fibrillation on Eliquis , CAD status post CABG, hyperlipidemia, hypertension, prior MI status post pacemaker placement and essential tremor who presents with about 2 to 3-week history of blurred vision in the left eye.  He was eval by ophthalmology and found to have symptomatic multi arterial retinal occlusion.  He had a CTA of the head and neck which demonstrated 60% stenosis of the proximal left ICA due to predominantly noncalcified plaque.  In addition, CTA also demonstrated atherosclerotic web of the left carotid bifurcation.  I suspect that the carotid web and the stenosis in the left ICA could have contribution to the noted multi arterial retinal occlusion left eye.  I want stroke team evaluate him to see if he would benefit from intervention.  RECOMMENDATIONS  - Frequent Neuro checks per  stroke unit protocol - Recommend brain imaging with MRI Brain without contrast - No need for TTE as patient is already on Eliquis . - Recommend obtaining Lipid panel with LDL - Please start statin if LDL > 70 - Recommend HbA1c to evaluate for diabetes and how well it is controlled. - Continue Eliquis  5 mg twice daily. - SBP goal -aim for gradual normotension. - Recommend Telemetry monitoring for arrythmia - Recommend bedside swallow screen prior to PO intake. - Stroke education booklet - Recommend PT/OT/SLP consult - Stroke team to evaluate him in a.m. to see if would benefit from intervention with noted left ICA stenosis and the atherosclerotic carotid web in the left ICA.  ______________________________________________________________________    Signed, Ardene Remley, MD [facility]

## 2023-11-10 NOTE — ED Notes (Signed)
Not in room at this time 

## 2023-11-10 NOTE — ED Notes (Signed)
 MD at bedside assessing pt.

## 2023-11-10 NOTE — ED Notes (Addendum)
Charge nurse aware of patient

## 2023-11-10 NOTE — ED Notes (Signed)
 Patient return from CT

## 2023-11-10 NOTE — Hospital Course (Addendum)
 Paul Frazier. Is an 89 yo male with ***  Had vision problems in L eye: seeing different shapes, had film over eye  Ophtho told him he may be having a stroke - saw "spots" on the eye No eye pain  No weakness, no speech changes No fevers, chills, n/v/d, abd pain, palpitations, chest pain, dysuria,   PMHx: CAD s/p CABG Combined systolic and diastolic heart failure HTN HLD Pacemaker Neuropathy  Tremor  Meds: Zyretec Eliquis  5 mg BID Lasix  10 mg daily Ativan  0.25 mg BID nitroglycerin  PRN Artificial tears Senna Flomax  0.4 mg BID Lipitor 20 mg at bedtime Celexa  40 mg daily Propranolol  160 mg daily (not on [facility] ***)   Social Hx: Lives in [place] [facility] since 2022 Does not drive at baseline Walks with a walker (has neuropathy in legs) Independent of ADLs Support: son PCP: does not have a PCP anymore (last saw in 27-Sep-2024doctor has since passed away)- hasn't seen the doctor at [facility] since he has been there. Going to establish with [facility] practice  Former tobacco use in the [phone]   Exam: - left pupil is more sluggish to react - left eye more dilated (had dilated eye exam) - left foot is weaker 20/50 vision on the R -- 20/200 on L but eye is dilated  "Patient has a visually symptoamtic multi-arterial retinal oclusion condition which requires a promp stroke workup" - amarosis fugax - branch retinal artery occlusions (multiple) - hollenhost cholesterol plaques - left eye field defect

## 2023-11-10 NOTE — H&P (Cosign Needed Addendum)
 Date: 11/10/2023               Patient Name:  Paul Frazier. MRN: [id number]  DOB: 02-Feb-1933 Age / Sex: 89 y.o., male   PCP: Pcp, No         Medical Service: Internal Medicine Teaching Service         Attending Physician: Dr. Kirt Pereyra, MD,MS      First Contact: Dr. Jose Ngo, MD     Second Contact: Dr. Lorelle Roll, MD         After Hours (After 5p/  First Contact Pager: [phone]  weekends / holidays): Second Contact Pager: [phone]   SUBJECTIVE   Chief Complaint:  Chief Complaint  Patient presents with   Cloudy Vision   History of Present Illness:   Paul Frazier. is an 89 year old gentleman with a history of coronary artery disease s/p CABG, hypertension, hyperlipidemia, combined systolic and diastolic heart failure, pacemaker, neuropathy, tremors, who presents today after his ophthalmologist told him to come to the ED because of vision changes he has been having for the past 2 weeks.  He said he has been having vision problems in the left eye, seeing different shapes, had what he describes "a film" over his eye.  Did not look like a curtain, but he just sees blurry now.  It is not painful.  He was seeing spots although he has a history of having floaters in his eyes.  He did get his eyes dilated today.  His ophthalmologist told him that he was concerned about him having a stroke so he should come to the ED.  He denies any visual pain, he has not had any other neurological deficits such as weakness, speech changes, or noticed changes in his face.  He does have an abnormal gait due to his neuropathy and resting tremors but he has not noticed this getting worse.  Denies any temporal pain as well as jaw pain.  He has also not had any fevers, chills, nausea vomiting diarrhea, abdominal pain. Denies palpitations or chest pain and is on Eliquis  for atrial fibrillation.  He used to smoke and quit in 1972.  He lives at [facility] which manages his  medications.  The last time he took Eliquis  was this morning.  PMHx: CAD s/p CABG Combined systolic and diastolic heart failure HTN HLD Pacemaker Neuropathy  Tremor  Meds: Zyrtec Eliquis  5 mg BID Lasix  10 mg daily Ativan  0.25 mg BID nitroglycerin  PRN Artificial tears Senna Flomax  0.4 mg BID Lipitor 20 mg at bedtime Celexa  40 mg daily Propranolol  160 mg daily (not on [facility] but seems like being dispensed every month)  Social Hx: Lives in [place] [facility] since 2022 Does not drive at baseline Walks with a walker (has neuropathy in legs) Independent of ADLs Support: son PCP: does not have a PCP anymore (last saw in September 19, 2024doctor has since passed away)- hasn't seen the doctor at [facility] since he has been there. Going to establish with [facility] practice  Former tobacco use in the [phone]   Allergies: Allergies as of 11/10/2023 - Review Complete 11/10/2023  Allergen Reaction Noted   Erythromycin Nausea Only 04/19/2012   Gabapentin  Other (See Comments) 02/04/2022   Fluorouracil Rash and Other (See Comments) 08/23/2020   Review of Systems: A complete ROS was negative except as per HPI.   OBJECTIVE:   Physical Exam: Blood pressure (!) 154/89, pulse 81, temperature 97.8 F (36.6  C), resp. rate 16, height 5\' 5"  (1.651 m), weight 81.2 kg, SpO2 100%.  Constitutional: Well appearing, laying comfortably in bed, in no acute distress HENT: normocephalic atraumatic, mucous membranes moist Eyes: conjunctiva non-injected, left pupil is more sluggish to constrict, however he just got dilated at him by his ophthalmologist.  Vision on the lower fields seems decreased bilaterally.  20/50 vision on the right 20/200 on the left but eye is dilated. Neck: supple Cardiovascular: regular rate and rhythm, no m/r/g Pulmonary/Chest: normal work of breathing on room air, lungs clear to auscultation bilaterally Abdominal: soft, nondistended, tender on the suprapubic  region MSK: normal bulk and tone Neurological: alert & oriented x 3, 5/5 strength in bilateral upper and lower extremities except that there is 4 out of 5 strength on foot dorsiflexion on the left. Skin: warm and dry Psych: Pleasant    Labs: CBC    Component Value Date/Time   WBC 8.2 11/10/2023 1334   RBC 4.77 11/10/2023 1334   HGB 14.8 11/10/2023 1334   HCT 46.0 11/10/2023 1334   PLT 154 11/10/2023 1334   MCV 96.4 11/10/2023 1334   MCH 31.0 11/10/2023 1334   MCHC 32.2 11/10/2023 1334   RDW 15.7 (H) 11/10/2023 1334   LYMPHSABS 1.4 08/02/2023 0327   MONOABS 1.1 (H) 08/02/2023 0327   EOSABS 0.1 08/02/2023 0327   BASOSABS 0.0 08/02/2023 0327     CMP     Component Value Date/Time   NA 138 11/10/2023 1334   NA 142 03/13/2020 0909   K 4.7 11/10/2023 1334   CL 102 11/10/2023 1334   CO2 28 11/10/2023 1334   GLUCOSE 108 (H) 11/10/2023 1334   BUN 10 11/10/2023 1334   BUN 13 03/13/2020 0909   CREATININE 0.87 11/10/2023 1334   CREATININE 1.11 05/28/2020 1546   CALCIUM  9.2 11/10/2023 1334   PROT 6.8 11/10/2023 1334   PROT 6.3 08/24/2016 1601   ALBUMIN 3.7 11/10/2023 1334   AST 25 11/10/2023 1334   ALT 22 11/10/2023 1334   ALKPHOS 82 11/10/2023 1334   BILITOT 0.9 11/10/2023 1334   GFRNONAA >60 11/10/2023 1334   GFRNONAA 60 05/28/2020 1546   GFRAA 70 05/28/2020 1546    Imaging:  EKG: AV dual paced rhythm, QTc is 528 ms  ASSESSMENT & PLAN:   Assessment & Plan by Problem: Principal Problem:   Central retinal artery occlusion of left eye  Paul Frazier. is an 89 year old gentleman with a history of coronary artery disease s/p CABG, hypertension, hyperlipidemia, combined systolic and diastolic heart failure, pacemaker, neuropathy, tremors, who presents today with painless vision changes on the left eye admitted for stroke workup due to concern for central retinal artery occlusion.  Central retinal artery occlusion on the left eye Per ophthalmologist note "Patient  has a visually symptomatic multi-arterial retinal occlusion condition which requires a promp stroke workup".  There is concern for amaurosis fugax given that he did have "a film going over his eye".  However it seems that the symptoms have resolved.  He has painless visual disturbance.  There is not a cherry red spot and it does not appear pale.  Per Ophthalmologist  retinal imaging was concerning for branch retinal artery occlusions (multiple) and Hollenhorst cholesterol plaques.  He certainly has risk factors for stroke, has a history of coronary artery disease, CABG, hypertension, hyperlipidemia, and is a former smoker.  I do see that he also has a lot of drusen, which is consistent with his hypertension.  Ophthalmologist  described left eye field defect, however on eye exam for me, he did have bilateral lower field deficits.  Vision is worse on the left, but his pupils just got dilated.  CTA is currently pending.  He otherwise does not have any other neurological deficits.   -Follow-up on CTA Head and Neck -Echo -Hemoglobin A1c -Lipid panel -The ED consulted neurology, per Dr. Randal Bury they will see pt, follow-up in the morning -Neurochecks q4hrs  -Hold Eliquis   Chronic medical problems:  Paroxysmal Atrial fibrillation Hx of left bundle branch block, second-degree atrioventricular block, s/p pacemaker in August 2019. Anticoagulated with Eliquis  (last dose this morning).  Takes propranolol  20 mg daily, dispensed monthly but not on MAR from [place].  Dual AV paced rhythm on EKG today. -Telemetry -hold Eliquis  and propanolol  Hyperlipidemia Currently on atorvastatin  20 mg daily, last lipid profile was done on November 2021.  LDL was 68 at that time.   -CW with atorvastatin  20 mg daily  Systolic and diastolic heart failure Currently euvolemic.  Takes Lasix  10 mg daily.  Last echo was done in 2019.  Will get an echo done today.  BPH He does have suprapubic tenderness today.  He notes that he has  not gone to the bathroom yet and does want to go.  Patient advised to notify us  if he is unable to urinate.  Only denies having any pain with urination. He has been having a slow stream for a long time. PSA 4 years ago was 42.2 he unfortunately lost his PCP because he passed away.  Will need to establish with a new PCP. - Continue with Flomax  0.4mg  BID -post-void bladder scan   Pre-diabetes Last hemoglobin A1c was 6.4 on August 26, 2020.  He is currently 89 years old. Likely diet controlled.  -CTM -Hgb A1C  Diet:  Heart healthy VTE: SCDs Code: DNR/DNI  Prior to Admission Living Arrangement: Assisted living facility Anticipated Discharge Location:  Assisted living facility Barriers to Discharge: Medical workup  Dispo: Admit patient to Observation with expected length of stay less than 2 midnights.  Signed: [facility]  Internal Medicine Resident, PGY-1 [facility]  11/10/2023, 6:46 PM

## 2023-11-10 NOTE — ED Provider Notes (Signed)
 [place] [facility] Provider Note   CSN: [id number] Arrival date & time: 11/10/23  1227     History {Add pertinent medical, surgical, social history, OB history to HPI:1} Chief Complaint  Patient presents with   Cloudy Vision    Marv Alfrey. is a 89 y.o. male.  He has a history of coronary disease cardiomyopathy paroxysmal a flutter pacemaker, and has some baseline unsteady gait for unclear significance going on over a few years.  He has had 2 weeks of left eye intermittent visual disturbance.  He said sometimes it is shadows and sometimes it has strings of color.  Saw an eye doctor today who noted some retinal artery occlusions and sent him here for further workup.  No headache numbness weakness difficulty with speech.  He said his balance is at baseline, and uses a walker to get around.  He has been compliant with his blood thinners.  The history is provided by the patient.  Eye Problem Location:  Left eye Quality: visual disturbance. Severity:  Moderate Onset quality:  Gradual Duration:  2 weeks Timing:  Intermittent Progression:  Unchanged Chronicity:  New Relieved by:  Nothing Ineffective treatments:  None tried Associated symptoms: decreased vision   Associated symptoms: no discharge, no double vision, no nausea, no numbness, no vomiting and no weakness        Home Medications Prior to Admission medications   Medication Sig Start Date End Date Taking? Authorizing Provider  acetaminophen  (TYLENOL ) 500 MG tablet Take 500 mg by mouth 2 (two) times daily as needed for moderate pain (pain score 4-6).   Yes [provider]  atorvastatin  (LIPITOR) 40 MG tablet Take 20 mg by mouth at bedtime. 06/28/23  Yes [provider]  Azelastine HCl 137 MCG/SPRAY SOLN Place 1 spray into both nostrils at bedtime. 12/05/20  Yes [provider]  cetirizine (ZYRTEC ALLERGY) 10 MG tablet Take 10 mg by mouth at bedtime.   Yes  [provider]  citalopram  (CELEXA ) 40 MG tablet Take 1 tablet (40 mg total) by mouth daily. 10/08/19 11/10/23 Yes Collier, Mylinda Asa R, PA-C  DEXTRAN 70-HYPROMELLOSE, PF, OP Place 1 drop into both eyes as needed (Dry Eye).   Yes [provider]  ELIQUIS  5 MG TABS tablet TAKE 1 TABLET BY MOUTH  TWICE DAILY Patient taking differently: Take 5 mg by mouth 2 (two) times daily. 04/07/20  Yes Croitoru, Mihai, MD  furosemide  (LASIX ) 20 MG tablet Take 10 mg by mouth daily. 02/15/23  Yes [provider]  LORazepam  (ATIVAN ) 0.5 MG tablet Take 0.25 mg by mouth 2 (two) times daily. 03/02/23  Yes [provider]  Menthol, Topical Analgesic, (BIOFREEZE COOL THE PAIN) 4 % GEL Apply 1 Application topically daily as needed (Pain).   Yes [provider]  nitroGLYCERIN  (NITROSTAT ) 0.4 MG SL tablet Place 1 tablet (0.4 mg total) under the tongue every 5 (five) minutes as needed for chest pain. 12/25/19  Yes Vangie Genet, MD  potassium chloride  SA (KLOR-CON ) 20 MEQ tablet Take 20 mEq by mouth 2 (two) times a week. Monday & Friday   Yes [provider]  propranolol  ER (INDERAL  LA) 160 MG SR capsule Take 160 mg by mouth daily. Do not give if systolic blood pressure is less than 100 or HR less than 60 06/02/23  Yes [provider]  senna-docusate (SENNA S) 8.6-50 MG tablet Take 2 tablets by mouth every other day.   Yes [provider]  tamsulosin  (FLOMAX ) 0.4 MG CAPS capsule Take 0.4 mg by mouth 2 (two) times daily. 02/21/23  Yes [provider]  White Petrolatum (VASELINE EX) Apply 1 Application topically at bedtime.   Yes [provider]  meclizine  (ANTIVERT ) 50 MG tablet Take 25 mg by mouth 2 (two) times daily. Patient not taking: Reported on 11/10/2023 06/30/23   [provider]      Allergies    Erythromycin, Gabapentin , and Fluorouracil    Review of Systems   Review of Systems  Constitutional:  Negative for fever.  HENT:   Negative for sore throat.   Eyes:  Negative for double vision and discharge.  Respiratory:  Negative for shortness of breath.   Cardiovascular:  Negative for chest pain.  Gastrointestinal:  Negative for abdominal pain, nausea and vomiting.  Genitourinary:  Negative for dysuria.  Skin:  Negative for rash.  Neurological:  Negative for weakness and numbness.    Physical Exam Updated Vital Signs BP (!) 154/89 (BP Location: Left Arm)   Pulse 81   Temp 97.8 F (36.6 C)   Resp 16   Ht 5\' 5"  (1.651 m)   Wt 81.2 kg   SpO2 100%   BMI 29.79 kg/m  Physical Exam Vitals and nursing note reviewed.  Constitutional:      General: He is not in acute distress.    Appearance: Normal appearance. He is well-developed.  HENT:     Head: Normocephalic and atraumatic.  Eyes:     General:        Right eye: No discharge.        Left eye: No discharge.     Extraocular Movements: Extraocular movements intact.     Conjunctiva/sclera: Conjunctivae normal.  Cardiovascular:     Rate and Rhythm: Normal rate and regular rhythm.     Heart sounds: No murmur heard. Pulmonary:     Effort: Pulmonary effort is normal. No respiratory distress.     Breath sounds: Normal breath sounds.  Abdominal:     Palpations: Abdomen is soft.     Tenderness: There is no abdominal tenderness.  Musculoskeletal:        General: No swelling.     Cervical back: Neck supple.  Skin:    General: Skin is warm and dry.     Capillary Refill: Capillary refill takes less than 2 seconds.  Neurological:     General: No focal deficit present.     Mental Status: He is alert and oriented to person, place, and time.     Sensory: No sensory deficit.     Motor: No weakness.     ED Results / Procedures / Treatments   Labs (all labs ordered are listed, but only abnormal results are displayed) Labs Reviewed  CBC - Abnormal; Notable for the following components:      Result Value   RDW 15.7 (*)    All other components within normal  limits  PROTIME-INR - Abnormal; Notable for the following components:   Prothrombin Time 16.6 (*)    INR 1.3 (*)    All other components within normal limits  COMPREHENSIVE METABOLIC PANEL WITH GFR - Abnormal; Notable for the following components:   Glucose, Bld 108 (*)    All other components within normal limits  SEDIMENTATION RATE    EKG EKG Interpretation Date/Time:  Thursday November 10 2023 13:57:41 EDT Ventricular Rate:  65 PR Interval:  178 QRS Duration:  154 QT Interval:  508 QTC Calculation: 528 R Axis:   -  48  Text Interpretation: AV dual-paced rhythm Abnormal ECG When compared with ECG of 02-Aug-2023 03:20, No significant change since last tracing Confirmed by Racheal Buddle [phone]) on 11/10/2023 3:56:04 PM  Radiology No results found.  Procedures Procedures  {Document cardiac monitor, telemetry assessment procedure when appropriate:1}  Medications Ordered in ED Medications - No data to display  ED Course/ Medical Decision Making/ A&P   {   Click here for ABCD2, HEART and other calculatorsREFRESH Note before signing :1}                              Medical Decision Making  This patient complains of ***; this involves an extensive number of treatment Options and is a complaint that carries with it a high risk of complications and morbidity. The differential includes ***  I ordered, reviewed and interpreted labs, which included *** I ordered medication *** and reviewed PMP when indicated. I ordered imaging studies which included *** and I independently    visualized and interpreted imaging which showed *** Additional history obtained from *** Previous records obtained and reviewed *** I consulted *** and discussed lab and imaging findings and discussed disposition.  Cardiac monitoring reviewed, *** Social determinants considered, *** Critical Interventions: ***  After the interventions stated above, I reevaluated the patient and found *** Admission and  further testing considered, ***   {Document critical care time when appropriate:1} {Document review of labs and clinical decision tools ie heart score, Chads2Vasc2 etc:1}  {Document your independent review of radiology images, and any outside records:1} {Document your discussion with family members, caretakers, and with consultants:1} {Document social determinants of health affecting pt's care:1} {Document your decision making why or why not admission, treatments were needed:1} Final Clinical Impression(s) / ED Diagnoses Final diagnoses:  None    Rx / DC Orders ED Discharge Orders     None

## 2023-11-10 NOTE — ED Triage Notes (Addendum)
 Pt sent from eye doctor. "Visually symptomatic multi arterial retinal ocular condition". And needds prompt stroke work up. Pt unable to give details. Pt sates that the eye doctor sent him and he feels fine. Was seeing spots and cloudy vision in left eye starting a week ago

## 2023-11-10 NOTE — ED Provider Triage Note (Signed)
 Emergency Medicine Provider Triage Evaluation Note  Paul Frazier. , a 89 y.o. male  was evaluated in triage.  Pt complains of visual loss..  Review of Systems  Positive: Visual loss Negative: Headache  Physical Exam  BP 129/69   Pulse 62   Temp 98.4 F (36.9 C) (Oral)   Resp 17   Ht 5\' 5"  (1.651 m)   Wt 81.2 kg   SpO2 100%   BMI 29.79 kg/m  Patient had eyes dilated and is wearing sunglasses.  Medical Decision Making  Medically screening exam initiated at 1:35 PM.  Appropriate orders placed.  Paul Frazier. was informed that the remainder of the evaluation will be completed by another provider, this initial triage assessment does not replace that evaluation, and the importance of remaining in the ED until their evaluation is complete.  Patient with a week or 2 of visual loss in left eye.  Sent in from ophthalmology with branch retinal artery occlusions.  Reportedly multiple occlusions.  Had discussed curbside with Dr. Doretta Gant from neurology.  Initially recommended CTA head and neck and MRI of the brain and orbits with and without contrast.  However patient does have a pacemaker.  Will get basic blood work sed rate and CTA for now.  Of note patient does have history of atrial fibrillation and is on anticoagulation.   Paul Arias, MD 11/10/23 [phone]

## 2023-11-11 ENCOUNTER — Observation Stay (HOSPITAL_COMMUNITY)

## 2023-11-11 ENCOUNTER — Other Ambulatory Visit: Payer: Self-pay

## 2023-11-11 ENCOUNTER — Telehealth: Payer: Self-pay

## 2023-11-11 DIAGNOSIS — R29818 Other symptoms and signs involving the nervous system: Secondary | ICD-10-CM | POA: Diagnosis not present

## 2023-11-11 DIAGNOSIS — I63232 Cerebral infarction due to unspecified occlusion or stenosis of left carotid arteries: Secondary | ICD-10-CM | POA: Diagnosis not present

## 2023-11-11 DIAGNOSIS — H538 Other visual disturbances: Secondary | ICD-10-CM | POA: Diagnosis not present

## 2023-11-11 DIAGNOSIS — H3412 Central retinal artery occlusion, left eye: Secondary | ICD-10-CM | POA: Diagnosis not present

## 2023-11-11 DIAGNOSIS — I2510 Atherosclerotic heart disease of native coronary artery without angina pectoris: Secondary | ICD-10-CM | POA: Diagnosis not present

## 2023-11-11 DIAGNOSIS — I509 Heart failure, unspecified: Secondary | ICD-10-CM | POA: Diagnosis not present

## 2023-11-11 DIAGNOSIS — H34232 Retinal artery branch occlusion, left eye: Secondary | ICD-10-CM | POA: Diagnosis not present

## 2023-11-11 DIAGNOSIS — I6522 Occlusion and stenosis of left carotid artery: Secondary | ICD-10-CM | POA: Diagnosis not present

## 2023-11-11 DIAGNOSIS — I4891 Unspecified atrial fibrillation: Secondary | ICD-10-CM | POA: Diagnosis not present

## 2023-11-11 DIAGNOSIS — I10 Essential (primary) hypertension: Secondary | ICD-10-CM

## 2023-11-11 DIAGNOSIS — Z7901 Long term (current) use of anticoagulants: Secondary | ICD-10-CM

## 2023-11-11 DIAGNOSIS — I639 Cerebral infarction, unspecified: Secondary | ICD-10-CM | POA: Diagnosis not present

## 2023-11-11 LAB — CBC
HCT: 42.2 % (ref 39.0–52.0)
Hemoglobin: 13.9 g/dL (ref 13.0–17.0)
MCH: 31.2 pg (ref 26.0–34.0)
MCHC: 32.9 g/dL (ref 30.0–36.0)
MCV: 94.8 fL (ref 80.0–100.0)
Platelets: 161 10*3/uL (ref 150–400)
RBC: 4.45 MIL/uL (ref 4.22–5.81)
RDW: 15.6 % — ABNORMAL HIGH (ref 11.5–15.5)
WBC: 8.3 10*3/uL (ref 4.0–10.5)
nRBC: 0 % (ref 0.0–0.2)

## 2023-11-11 LAB — BASIC METABOLIC PANEL WITH GFR
Anion gap: 9 (ref 5–15)
BUN: 10 mg/dL (ref 8–23)
CO2: 26 mmol/L (ref 22–32)
Calcium: 8.9 mg/dL (ref 8.9–10.3)
Chloride: 103 mmol/L (ref 98–111)
Creatinine, Ser: 0.86 mg/dL (ref 0.61–1.24)
GFR, Estimated: >60 mL/min (ref >60)
Glucose, Bld: 92 mg/dL (ref 70–99)
Potassium: 4.5 mmol/L (ref 3.5–5.1)
Sodium: 138 mmol/L (ref 135–145)

## 2023-11-11 LAB — ECHOCARDIOGRAM COMPLETE
AR max vel: 1.82 cm2
AV Area VTI: 1.79 cm2
AV Area mean vel: 1.77 cm2
AV Mean grad: 2 mmHg
AV Peak grad: 3 mmHg
Ao pk vel: 0.86 m/s
Area-P 1/2: 3.74 cm2
Calc EF: 41.9 %
Height: 65 in
MV VTI: 1.13 cm2
S' Lateral: 4.5 cm
Single Plane A2C EF: 45 %
Single Plane A4C EF: 39.3 %
Weight: 2864.22 [oz_av]

## 2023-11-11 LAB — LIPID PANEL
Cholesterol: 104 mg/dL (ref 0–200)
HDL: 38 mg/dL — ABNORMAL LOW (ref >40)
LDL Cholesterol: 52 mg/dL (ref 0–99)
Total CHOL/HDL Ratio: 2.7 ratio
Triglycerides: 70 mg/dL (ref <150)
VLDL: 14 mg/dL (ref 0–40)

## 2023-11-11 LAB — HEMOGLOBIN A1C
Hgb A1c MFr Bld: 6 % — ABNORMAL HIGH (ref 4.8–5.6)
Mean Plasma Glucose: 125.5 mg/dL

## 2023-11-11 LAB — TSH: TSH: 0.954 u[IU]/mL (ref 0.350–4.500)

## 2023-11-11 MED ORDER — STROKE: EARLY STAGES OF RECOVERY BOOK
Freq: Once | Status: AC
  Filled 2023-11-11: qty 1

## 2023-11-11 MED ORDER — PROPRANOLOL HCL ER 80 MG PO CP24
160.0000 mg | ORAL_CAPSULE | Freq: Every day | ORAL | Status: DC
  Filled 2023-11-11: qty 2

## 2023-11-11 MED ORDER — GADOBUTROL 1 MMOL/ML IV SOLN
6.0000 mL | Freq: Once | INTRAVENOUS | Status: AC | PRN
  Administered 2023-11-11: 6 mL via INTRAVENOUS

## 2023-11-11 NOTE — Progress Notes (Signed)
 Called report to [facility] place where patient is being DC'd back to. Patient being transported by son.

## 2023-11-11 NOTE — Progress Notes (Addendum)
 STROKE TEAM PROGRESS NOTE    INTERIM HISTORY/SUBJECTIVE Presented with recurrent transient episodes of left eye vision dysfunction and on ophthalmoscopic exam was found to have multiple retinal artery branch occlusions.  CT angiogram showed 60% proximal left carotid calcific stenosis along with a web which is likely symptomatic Patient remains hemodynamically stable and afebrile.  MRI reveals small left occipital infarct.  Vascular surgery was consulted for intervention on left carotid given stroke on MRI and retinal artery occlusions.  OBJECTIVE  CBC    Component Value Date/Time   WBC 8.3 11/11/2023 0634   RBC 4.45 11/11/2023 0634   HGB 13.9 11/11/2023 0634   HCT 42.2 11/11/2023 0634   PLT 161 11/11/2023 0634   MCV 94.8 11/11/2023 0634   MCH 31.2 11/11/2023 0634   MCHC 32.9 11/11/2023 0634   RDW 15.6 (H) 11/11/2023 0634   LYMPHSABS 1.4 08/02/2023 0327   MONOABS 1.1 (H) 08/02/2023 0327   EOSABS 0.1 08/02/2023 0327   BASOSABS 0.0 08/02/2023 0327    BMET    Component Value Date/Time   NA 138 11/11/2023 0634   NA 142 03/13/2020 0909   K 4.5 11/11/2023 0634   CL 103 11/11/2023 0634   CO2 26 11/11/2023 0634   GLUCOSE 92 11/11/2023 0634   BUN 10 11/11/2023 0634   BUN 13 03/13/2020 0909   CREATININE 0.86 11/11/2023 0634   CREATININE 1.11 05/28/2020 1546   CALCIUM  8.9 11/11/2023 0634   GFRNONAA >60 11/11/2023 0634   GFRNONAA 60 05/28/2020 1546    IMAGING past 24 hours CT ANGIO HEAD NECK W WO CM Result Date: 11/10/2023 CLINICAL DATA:  Monocular vision loss EXAM: CT ANGIOGRAPHY HEAD AND NECK WITH AND WITHOUT CONTRAST TECHNIQUE: Multidetector CT imaging of the head and neck was performed using the standard protocol during bolus administration of intravenous contrast. Multiplanar CT image reconstructions and MIPs were obtained to evaluate the vascular anatomy. Carotid stenosis measurements (when applicable) are obtained utilizing NASCET criteria, using the distal internal carotid  diameter as the denominator. RADIATION DOSE REDUCTION: This exam was performed according to the departmental dose-optimization program which includes automated exposure control, adjustment of the mA and/or kV according to patient size and/or use of iterative reconstruction technique. CONTRAST:  75mL OMNIPAQUE  IOHEXOL  350 MG/ML SOLN COMPARISON:  None Available. FINDINGS: CT HEAD FINDINGS Brain: No mass,hemorrhage or extra-axial collection. Normal appearance of the parenchyma and CSF spaces. Vascular: No hyperdense vessel or unexpected vascular calcification. Skull: The visualized skull base, calvarium and extracranial soft tissues are normal. Sinuses/Orbits: No fluid levels or advanced mucosal thickening of the visualized paranasal sinuses. No mastoid or middle ear effusion. Normal orbits. CTA NECK FINDINGS Skeleton: No acute abnormality or high grade bony spinal canal stenosis. Other neck: Normal pharynx, larynx and major salivary glands. No cervical lymphadenopathy. Thyromegaly. Upper chest: No pneumothorax or pleural effusion. No nodules or masses. Aortic arch: There is calcific atherosclerosis of the aortic arch. Conventional 3 vessel aortic branching pattern. RIGHT carotid system: No dissection, occlusion or aneurysm. There is mixed density atherosclerosis extending into the proximal ICA, resulting in less than 50% stenosis. There is severe stenosis of the proximal right external carotid artery. LEFT carotid system: Atherosclerotic web at the carotid bifurcation. Approximately 60% stenosis of the proximal ICA due to predominantly noncalcified plaque. Vertebral arteries: Codominant configuration. The right vertebral artery V1 and proximal V2 segments are occluded. The remainder of the right vertebral artery is normal. The left vertebral artery is normal. CTA HEAD FINDINGS POSTERIOR CIRCULATION: Vertebral arteries are normal.  No proximal occlusion of the anterior or inferior cerebellar arteries. Basilar artery is  normal. Superior cerebellar arteries are normal. Posterior cerebral arteries are normal. ANTERIOR CIRCULATION: Atherosclerotic calcification of the internal carotid arteries at the skull base without hemodynamically significant stenosis. Anterior cerebral arteries are normal. Middle cerebral arteries are normal. Venous sinuses: As permitted by contrast timing, patent. Anatomic variants: None Review of the MIP images confirms the above findings. IMPRESSION: 1. No intracranial arterial occlusion. 2. Occlusion of the right vertebral artery V1 and proximal V2 segments. 3. 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 Approximately 60% stenosis of the proximal left ICA due to predominantly noncalcified plaque. 5. Severe stenosis of the proximal right external carotid artery. 6. Thyromegaly. Aortic Atherosclerosis (ICD10-I70.0). Electronically Signed   By: Juanetta Nordmann M.D.   On: 11/10/2023 21:26    Vitals:   11/10/23 2100 11/10/23 2125 11/11/23 0424 11/11/23 0913  BP: (!) 156/83 (!) 142/70 123/64 (!) 111/58  Pulse: 66 61 70 63  Resp: 19 18  17   Temp:  99 F (37.2 C) 97.8 F (36.6 C) 98.7 F (37.1 C)  TempSrc:  Oral Oral Oral  SpO2: 100% 99% 98% 99%  Weight:      Height:         PHYSICAL EXAM General:  Alert, well-nourished, well-developed patient in no acute distress Psych:  Mood and affect appropriate for situation CV: Regular rate and rhythm on monitor Respiratory:  Regular, unlabored respirations on room air GI: Abdomen soft and nontender   NEURO:  Mental Status: AA&Ox3, patient is able to give clear and coherent history Speech/Language: speech is without dysarthria or aphasia.    Cranial Nerves:  II: PERRL. Visual fields full in both eyes simultaneously and each eye tested separately.  Decreased visual acuity at distance in the left eye III, IV, VI: EOMI. Eyelids elevate symmetrically.  V: Sensation is intact to light touch and symmetrical to face.  VII: Face is  symmetrical resting and smiling VIII: hearing intact to voice. IX, X: Palate elevates symmetrically. Phonation is normal.  YN:WGNFAOZH shrug 5/5. XII: tongue is midline without fasciculations. Motor: 5/5 strength to all muscle groups tested.  Tone: is normal and bulk is normal Sensation- Intact to light touch bilaterally.  Coordination: FTN intact bilaterally Gait- deferred  Most Recent NIH 0  ASSESSMENT/PLAN  Mr. Paul Frazier. is a 89 y.o. male with history of arthritis, CAD status post CABG, hypertension, hyperlipidemia, MI, pacemaker, essential tremor and A-fib on Eliquis  admitted for a 2 to 3-week history of visual deficits in the left eye.  Patient states that it appears as though her film is being pulled over the lower half of his vision in his left eye.  He saw his ophthalmologist who discovered retinal artery occlusions and sent patient here for evaluation.  MRI reveals small left occipital infarct, and patient has 60% stenosis of left ICA with carotid web.  Vascular surgery has been consulted given stroke and retinal artery occlusions on the side of the stenosis.  NIH on Admission 0  Acute Ischemic Infarct: Left occipital lobe infarct and left retinal artery occlusion Etiology: Atheroembolic in the setting of symptomatic carotid stenosis CT head No acute abnormality.  CTA head & neck no intracranial artery occlusion, occlusion of right vertebral artery V1 and proximal V2 segments, atherosclerotic web at left carotid bifurcation, 60% stenosis of proximal left ICA, severe stenosis of proximal right external carotid artery MRI acute ischemic stroke in left occipital lobe Carotid Doppler pending  2D Echo EF 30 to 35%, dilated left ventricle, grade 2 diastolic dysfunction, moderately reduced right ventricular systolic function, mildly dilated right and left atria, no atrial level shunt LDL 52 HgbA1c 6.0 VTE prophylaxis -fully anticoagulated with Eliquis  Eliquis  (apixaban ) daily  prior to admission, now on Eliquis  (apixaban ) daily  Therapy recommendations:  Home Health PT Disposition: Pending, likely home  60% left carotid stenosis with carotid web Patient had 60% stenosis of the left carotid artery with carotid web Given left-sided stroke and retinal artery occlusion, this is symptomatic with possibly unstable plaque Vascular surgery consulted for possible intervention  Atrial fibrillation Home Meds: Eliquis  5 mg twice daily, propranolol  160 mg daily Continue telemetry monitoring Continue anticoagulation with Eliquis   Hypertension Home meds: Propranolol  160 mg daily Stable Blood Pressure Goal: BP less than 220/110   Hyperlipidemia Home meds: Atorvastatin  20 mg daily, resumed in hospital LDL 52, goal < 70 High intensity statin not indicated as LDL below goal Continue statin at discharge  Other Stroke Risk Factors Coronary artery disease Congestive heart failure   Other Active Problems None  Hospital day # 0  Patient seen by NP with MD, MD to edit note as needed. Paul Frazier , MSN, AGACNP-BC [facility] See Amion for schedule and pager information 11/11/2023 2:21 PM   I have personally obtained history,examined this patient, reviewed notes, independently viewed imaging studies, participated in medical decision making and plan of care.ROS completed by me personally and pertinent positives fully documented  I have made any additions or clarifications directly to the above note. Agree with note above.  Patient presented with recurrent episodes of left eye transient vision dysfunction with multiple retinal artery branch emboli as well as silent left occipital infarcts from proximal moderate left carotid calcific stenosis with a variant which is symptomatic.  Recommend elective left carotid revascularization and discussed case with Dr. Fulton Job and we will schedule it for next week.  Continue Eliquis  anticoagulation for now but will have  to hold it for 3 days prior to elective carotid revascularization.  Long discussion with patient and answered questions.  Greater than 50% time during this 50-minute visit spent in counseling coordination of care about his retinal eye  emboli and discussion about plan for left carotid revascularization answering questions. Ardella Beaver, MD Medical Director [facility] Pager: [phone] 11/11/2023 4:17 PM  To contact Stroke Continuity provider, please refer to [email or web address]. After hours, contact General Neurology

## 2023-11-11 NOTE — Progress Notes (Signed)
 HD#0 SUBJECTIVE:  Patient Summary:  Paul Frazier. is an 89 year old gentleman with a history of coronary artery disease s/p CABG, hypertension, hyperlipidemia, combined systolic and diastolic heart failure, pacemaker, neuropathy, tremors, who presents today with painless vision changes on the left eye admitted for stroke workup due to concern for central retinal artery occlusion.   Overnight Events: CTA showing 60% stenosis of the left ICA, there is also an atherosclerotic web at the left carotid bifurcation. He also has severe stenosis of the right external carotid, and occlusion of the right vertebral artery at the V1 in the proximal V2 segment  Interim History: He feels great overnight he states that his vision has remained about the same.  He is scared about the results of his imaging. States that his bilateral lower extremities feel numb, has not changed in many years.  OBJECTIVE:  Vital Signs: Vitals:   11/10/23 2100 11/10/23 2125 11/11/23 0424 11/11/23 0913  BP: (!) 156/83 (!) 142/70 123/64 (!) 111/58  Pulse: 66 61 70 63  Resp: 19 18  17   Temp:  99 F (37.2 C) 97.8 F (36.6 C) 98.7 F (37.1 C)  TempSrc:  Oral Oral Oral  SpO2: 100% 99% 98% 99%  Weight:      Height:       Supplemental O2: Room Air SpO2: 99 %  Filed Weights   11/10/23 1309  Weight: 81.2 kg    Intake/Output Summary (Last 24 hours) at 11/11/2023 1218 Last data filed at 11/11/2023 1610 Gross per 24 hour  Intake --  Output 550 ml  Net -550 ml   Net IO Since Admission: -550 mL [11/11/23 1218]  Physical Exam: Physical Exam Constitutional:      General: He is not in acute distress.    Appearance: He is not ill-appearing.  Cardiovascular:     Rate and Rhythm: Normal rate and regular rhythm.     Heart sounds: No murmur heard.    No gallop.  Pulmonary:     Effort: No respiratory distress.  Abdominal:     General: Abdomen is flat. There is no distension.     Palpations: Abdomen is soft.   Musculoskeletal:     Right lower leg: No edema.     Left lower leg: No edema.  Neurological:     Mental Status: He is oriented to person, place, and time.     Cranial Nerves: No facial asymmetry.     Motor: Motor function is intact.     Coordination: Finger-Nose-Finger Test and Heel to [place] Test normal.     Comments: Vision on left eye with correction lenses on is at best 20/70, 20/20 on the right eye.     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  MRI results pending.  CTA did not reveal any acute bleeding intracranially however he does have an atherosclerotic web at the left ICA bifurcation which may have been causing his symptoms.  He does have 60% stenosis of the left ICA.  Lipid profile came back today with a LDL of 52 and at goal.  He is on a lower dose of atorvastatin  but will not increase dose at this time given his LDL.  He does have prediabetes with an A1c of 6.  He may benefit from lifestyle interventions.  Stroke  team will see him today.  -Follow-up on MRI brain without contrast -Holding TTE as he is on Eliquis  -Continue neurochecks every 4 hours -Okay to restart Eliquis  5 mg twice daily -PT OT  Chronic medical problems:   Paroxysmal Atrial fibrillation Hx of left bundle branch block, second-degree atrioventricular block, s/p pacemaker in August 2019. Dual pacemaker in place.  Heart rate is regular today.   Hyperlipidemia cw atorvastatin  20 mg daily   Systolic and diastolic heart failure Currently euvolemic.  Takes Lasix  10 mg daily.  Last echo was done in 2019.  The time ejection fraction was 30 to 35% with diffuse hypokinesis. -Pending echo results  Essential tremor Thyromegaly Tremor is stable he is on propranolol  60 mg daily, and Ativan  0.25 mg  twice daily.  He will continue with this regimen.  Imaging incidentally showed thyromegaly.  He does have a history of a low TSH, and does say that he had been on medication in the past.  TSH is borderline low today at, 0.954 however it does not warrant treatment at this time.  Neuropathy He is allergic to gabapentin .  Currently taking citalopram  for mood disorder.  He may benefit from switching to an SNRI such as venlafaxine or duloxetine. Will need follow-up outpatient.  BPH Will need follow-up outpatient, he was able to urinate with 176 mL postvoid residual.  He is not having any tenderness today.   Prediabetes A1c is 6 today.  May benefit from lifestyle modification, he is 89 years old.  Best Practice: Diet: Cardiac diet VTE: Eliquis  5 mg twice daily Code: [id number] DISPO: Anticipated discharge tomorrow to  skilled living facility  pending  medical workup .  Signature: [facility]  Internal Medicine Resident, PGY-1 [facility]  12:18 PM, 11/11/2023   You need to reach the team please contact the on-call pager at [phone].

## 2023-11-11 NOTE — Progress Notes (Signed)
  Echocardiogram 2D Echocardiogram has been performed.  Bartley Vuolo L Abbigayle Toole RDCS 11/11/2023, 1:43 PM

## 2023-11-11 NOTE — Telephone Encounter (Signed)
 Spoke to pt's son who was with pt waiting to be d/c to give him pre op instructions. I have also spoken to [facility] to give them these instructions.

## 2023-11-11 NOTE — Evaluation (Signed)
 Physical Therapy Evaluation Patient Details Name: Paul Frazier. MRN: [id number] DOB: 03/29/1933 Today's Date: 11/11/2023  History of Present Illness  89 y.o. male with hx of arthritis, CAD status post CABG, hyperlipidemia, hypertension, prior MI status post pacemaker placement and essential tremor who presents with about 2 to 3-week history of blurred vision in the left eye.  MRI pending.  CT Angio:Occlusion of the right vertebral artery V1 and proximal V2  segments.  3. Atherosclerotic web at the left carotid bifurcation  4. Approximately 60% stenosis of the proximal left ICA due to  predominantly noncalcified plaque.  5. Severe stenosis of the proximal right external carotid artery.  Clinical Impression  Pt is presenting close to baseline level of functioning. Currently pt is supervision to CGA for sit to stand, transfers and gait with rollator. Pt is safe with rollator brakes and very conscious of locking prior to sitting/standing. Requires safety cues for safe transfers to prevent retro gait for long distances. Due to pt current functional status, home set up and available assistance at home recommending skilled physical therapy services 3x/week in order to address strength, balance and functional mobility to decrease risk for falls, injury and re-hospitalization.           If plan is discharge home, recommend the following: A little help with walking and/or transfers;Assistance with cooking/housework;Assist for transportation     Equipment Recommendations None recommended by PT     Functional Status Assessment Patient has had a recent decline in their functional status and demonstrates the ability to make significant improvements in function in a reasonable and predictable amount of time.     Precautions / Restrictions Precautions Precautions: Fall Restrictions Weight Bearing Restrictions Per Provider Order: No      Mobility  Bed Mobility   General bed mobility comments: See OT  notes    Transfers Overall transfer level: Needs assistance Equipment used: Rollator (4 wheels) Transfers: Sit to/from Stand, Bed to chair/wheelchair/BSC Sit to Stand: Supervision   Step pivot transfers: Contact guard assist       General transfer comment: Pt turns very early to sit in chair and starts ambulating backwards. Poor balance with retrogait requiring CGA. Cues for safety in the future.    Ambulation/Gait Ambulation/Gait assistance: Supervision Gait Distance (Feet): 250 Feet Assistive device: Rollator (4 wheels) Gait Pattern/deviations: Step-through pattern, Knee hyperextension - right Gait velocity: decreased Gait velocity interpretation: 1.31 - 2.62 ft/sec, indicative of limited community ambulator   General Gait Details: Slight steppage gait bil with hyperextension at the knees with poor quad control in WB bil and flat foot inital contact with forefoot contact to floor prior to mid foot and heel. Pt has difficulty correct with cues.     Balance Overall balance assessment: Needs assistance Sitting-balance support: Feet supported Sitting balance-Leahy Scale: Good     Standing balance support: Reliant on assistive device for balance, Bilateral upper extremity supported, Single extremity supported Standing balance-Leahy Scale: Poor       Pertinent Vitals/Pain Pain Assessment Pain Assessment: No/denies pain    Home Living Family/patient expects to be discharged to:: Assisted living       Home Equipment: Rollator (4 wheels);Grab bars - tub/shower;Grab bars - toilet;Shower seat      Prior Function Prior Level of Function : Independent/Modified Independent     Mobility Comments: Patient stating currently receiving PT for balance.  Baseline balance deficits.  Uses 4WRW ADLs Comments: Patient states Mod I for ADL in his room.  Extremity/Trunk Assessment   Upper Extremity Assessment Upper Extremity Assessment: Defer to OT evaluation    Lower  Extremity Assessment Lower Extremity Assessment: Overall WFL for tasks assessed    Cervical / Trunk Assessment Cervical / Trunk Assessment: Normal  Communication   Communication Communication: No apparent difficulties    Cognition Arousal: Alert Behavior During Therapy: WFL for tasks assessed/performed       Following commands: Intact       Cueing Cueing Techniques: Verbal cues     General Comments General comments (skin integrity, edema, etc.): no noted skin issues outside of gown. No signs/symptoms of cardiac/respiratory distress during session        Assessment/Plan    PT Assessment Patient needs continued PT services  PT Problem List Decreased balance;Decreased coordination;Decreased safety awareness       PT Treatment Interventions DME instruction;Therapeutic exercise;Balance training;Gait training;Neuromuscular re-education;Functional mobility training;Therapeutic activities;Patient/family education    PT Goals (Current goals can be found in the Care Plan section)  Acute Rehab PT Goals Patient Stated Goal: to improve balance PT Goal Formulation: With patient Time For Goal Achievement: 11/25/23 Potential to Achieve Goals: Fair    Frequency Min 2X/week        AM-PAC PT "6 Clicks" Mobility  Outcome Measure Help needed turning from your back to your side while in a flat bed without using bedrails?: None Help needed moving from lying on your back to sitting on the side of a flat bed without using bedrails?: None Help needed moving to and from a bed to a chair (including a wheelchair)?: A Little Help needed standing up from a chair using your arms (e.g., wheelchair or bedside chair)?: A Little Help needed to walk in hospital room?: A Little Help needed climbing 3-5 steps with a railing? : A Little 6 Click Score: 20    End of Session Equipment Utilized During Treatment: Gait belt Activity Tolerance: Patient tolerated treatment well Patient left: in  chair;with call bell/phone within reach Nurse Communication: Mobility status PT Visit Diagnosis: Unsteadiness on feet (R26.81);Other abnormalities of gait and mobility (R26.89)    Time: 4098-1191 PT Time Calculation (min) (ACUTE ONLY): 12 min   Charges:   PT Evaluation $PT Eval Low Complexity: 1 Low   PT General Charges $$ ACUTE PT VISIT: 1 Visit        Sloan Duncans, DPT, CLT  Acute Rehabilitation Services Office: [phone] (Secure chat preferred)   Jenice Mitts 11/11/2023, 10:30 AM

## 2023-11-11 NOTE — Evaluation (Signed)
 Occupational Therapy Evaluation Patient Details Name: Paul Frazier. MRN: [id number] DOB: 10/05/1932 Today's Date: 11/11/2023   History of Present Illness   89 y.o. male with hx of arthritis, CAD status post CABG, hyperlipidemia, hypertension, prior MI status post pacemaker placement and essential tremor who presents with about 2 to 3-week history of blurred vision in the left eye.  MRI pending.  CT Angio:Occlusion of the right vertebral artery V1 and proximal V2  segments.  3. Atherosclerotic web at the left carotid bifurcation  4. Approximately 60% stenosis of the proximal left ICA due to  predominantly noncalcified plaque.  5. Severe stenosis of the proximal right external carotid artery.     Clinical Impressions Patient admitted for the diagnosis above.  PTA he lives at a local facility where he states he is Mod I for ADL completion and Mod I for mobility at a 4WRW level.  Patient is undergoing PT at his facility for balancer training.  Probably close to his baseline given balance deficits, but up to CGA for mobility, especially during transitional movements, tends to get back on his heels.  OT will follow in the acute setting, but no post acute OT is anticipated.       If plan is discharge home, recommend the following:   Assist for transportation     Functional Status Assessment   Patient has had a recent decline in their functional status and demonstrates the ability to make significant improvements in function in a reasonable and predictable amount of time.     Equipment Recommendations   None recommended by OT     Recommendations for Other Services         Precautions/Restrictions   Precautions Precautions: Fall Restrictions Weight Bearing Restrictions Per Provider Order: No     Mobility Bed Mobility Overal bed mobility: Modified Independent                  Transfers Overall transfer level: Needs assistance   Transfers: Sit to/from Stand,  Bed to chair/wheelchair/BSC Sit to Stand: Supervision     Step pivot transfers: Contact guard assist            Balance Overall balance assessment: Needs assistance Sitting-balance support: Feet supported Sitting balance-Leahy Scale: Good     Standing balance support: Reliant on assistive device for balance Standing balance-Leahy Scale: Poor                             ADL either performed or assessed with clinical judgement   ADL       Grooming: Supervision/safety;Standing               Lower Body Dressing: Contact guard assist;Sit to/from stand   Toilet Transfer: Contact guard assist;Regular Toilet;Rollator (4 wheels);Ambulation             General ADL Comments: More CGA during transitional movements.     Vision Ability to See in Adequate Light: 2 Moderately impaired Patient Visual Report: Blurring of vision Additional Comments: states L vision blurred "like a film is over it"     Perception Perception: Within Functional Limits       Praxis Praxis: WFL       Pertinent Vitals/Pain Pain Assessment Pain Assessment: No/denies pain     Extremity/Trunk Assessment Upper Extremity Assessment Upper Extremity Assessment: Overall WFL for tasks assessed   Lower Extremity Assessment Lower Extremity Assessment: Defer to PT evaluation   Cervical /  Trunk Assessment Cervical / Trunk Assessment: Normal   Communication Communication Communication: No apparent difficulties   Cognition Arousal: Alert Behavior During Therapy: WFL for tasks assessed/performed Cognition: No apparent impairments                               Following commands: Intact       Cueing  General Comments   Cueing Techniques: Verbal cues   VSS   Exercises     Shoulder Instructions      Home Living Family/patient expects to be discharged to:: Assisted living                             Home Equipment: Rollator (4 wheels);Grab  bars - tub/shower;Grab bars - toilet;Shower seat          Prior Functioning/Environment Prior Level of Function : Independent/Modified Independent             Mobility Comments: Patient stating currently receiving PT for balance.  Baseline balance deficits.  Uses 4WRW ADLs Comments: Patient states Mod I for ADL in his room.    OT Problem List: Impaired balance (sitting and/or standing);Impaired vision/perception   OT Treatment/Interventions: Self-care/ADL training;Therapeutic activities;Patient/family education;DME and/or AE instruction;Balance training      OT Goals(Current goals can be found in the care plan section)   Acute Rehab OT Goals Patient Stated Goal: Return to facility OT Goal Formulation: With patient Time For Goal Achievement: 11/25/23 Potential to Achieve Goals: Good ADL Goals Pt Will Perform Grooming: with modified independence;standing Pt Will Perform Lower Body Dressing: with modified independence;sit to/from stand Pt Will Transfer to Toilet: with modified independence;ambulating;regular height toilet   OT Frequency:  Min 2X/week    Co-evaluation              AM-PAC OT "6 Clicks" Daily Activity     Outcome Measure Help from another person eating meals?: None Help from another person taking care of personal grooming?: A Little Help from another person toileting, which includes using toliet, bedpan, or urinal?: A Little Help from another person bathing (including washing, rinsing, drying)?: A Little Help from another person to put on and taking off regular upper body clothing?: None Help from another person to put on and taking off regular lower body clothing?: A Little 6 Click Score: 20   End of Session Equipment Utilized During Treatment: Rollator (4 wheels) Nurse Communication: Mobility status  Activity Tolerance: Patient tolerated treatment well Patient left: in chair;with call bell/phone within reach  OT Visit Diagnosis: Unsteadiness  on feet (R26.81)                Time: 1610-9604 OT Time Calculation (min): 21 min Charges:  OT General Charges $OT Visit: 1 Visit OT Evaluation $OT Eval Moderate Complexity: 1 Mod  11/11/2023  RP, OTR/L  Acute Rehabilitation Services  Office:  [phone]   Benjamen Brand 11/11/2023, 8:42 AM

## 2023-11-11 NOTE — Progress Notes (Signed)
 Carotid arterial duplex completed. Please see CV Procedures for preliminary results.  Estanislao Heimlich, RVT 11/11/23 4:54 PM

## 2023-11-11 NOTE — Consult Note (Addendum)
 Hospital Consult    Reason for Consult:  symptomatic left carotid artery stenosis Requesting Physician:  Bucky Cardinal, NP MRN #:  [id number]  History of Present Illness: This is a 89 y.o. male who presented to the hospital with 3 week hx of left eye visual changes.    He states that a couple of weeks ago, he started having blurry vision in the left eye.  He states it is is still present and will get better and then get worse again.  He denies any unilateral weakness, numbness or paralysis.  He states he has carpal tunnel in the right hand.  He did have an echo today that reveals decreased EF of 30-35%.  Left atrium is dilated but no mention of cardiac thrombus.  He has carotid duplex ordered for this afternoon.    He did have a CTA head/neck, which revealed a webbing of the left ICA and ~ 60% stenosis.    He does reside at [facility]. He states that he does get some cramping in his legs with walking about 200 feet.  He states that if he slows down, it does improve.  He does not have to stop for this to improve, just slow down.  He denies any non healing wounds on his feet. He states he does have neuropathy in both feet.   He does have hx of PPM on the left side.  He has hx of CAD/MI with hx of CABG in 1986 with vein harvest from bilateral legs. He has hx of CHF.  He has hx of left sided PPM and hx of RF ablation. He has hx of cholecystectomy initially laparoscopic but sounds as if they had to go back and open. He has hx of atrial fibrillation and is on Eliquis  for this.  He has hx of covid 19.  In 2021, he was noted to have occlusion/stenosis of right vertebral artery.  The pt is on a statin for cholesterol management.  The pt is not on a daily aspirin .   Other AC:  Eliquis  The pt is on BB, diuretic for hypertension.   The pt is not on medication for diabetes PTA. Tobacco hx:  former-quit 1972  Past Medical History:  Diagnosis Date   Arthritis    CAD (coronary artery disease)    Cancer  (HCC)    Hyperlipidemia    Hypertension    Kidney stones    Old myocardial infarct    Right inguinal hernia 04/19/2012   S/P CABG x 5 08/29/1984   LIMA to LAD,SVG to diagonal,SVG to OM,SVG to PDA and PLA   Tremor, essential     Past Surgical History:  Procedure Laterality Date   A-FLUTTER ABLATION N/A 03/09/2018   Procedure: A-FLUTTER ABLATION;  Surgeon: Tammie Fall, MD;  Location: [facility] INVASIVE CV LAB;  Service: Cardiovascular;  Laterality: N/A;   CHOLECYSTECTOMY  10/1990   CORONARY ARTERY BYPASS GRAFT  1986   x5 LIMA to LAD,SVG to diagonal,SVG to OM,SVG to PDA and PLA   CORONARY STENT PLACEMENT     INGUINAL HERNIA REPAIR  04/25/2012   right- laparoscopic   PACEMAKER IMPLANT N/A 03/09/2018   Procedure: PACEMAKER IMPLANT;  Surgeon: Tammie Fall, MD;  Location: [facility] INVASIVE CV LAB;  Service: Cardiovascular;  Laterality: N/A;   R/P Myoview   12/27/2008   no ischemia   TEE WITHOUT CARDIOVERSION N/A 03/09/2018   Procedure: TRANSESOPHAGEAL ECHOCARDIOGRAM (TEE);  Surgeon: Elmyra Haggard, MD;  Location: [facility] ENDOSCOPY;  Service: Cardiovascular;  Laterality: N/A;  Bubble Study   US  ECHOCARDIOGRAPHY  12/27/2008   concentric LVH,mild to mod MR, TR and pulmonary hypertension. EF 45-50%    Allergies  Allergen Reactions   Erythromycin Nausea Only   Gabapentin  Other (See Comments)   Fluorouracil Rash and Other (See Comments)    Mouth ulcers    Prior to Admission medications   Medication Sig Start Date End Date Taking? Authorizing Provider  acetaminophen  (TYLENOL ) 500 MG tablet Take 500 mg by mouth 2 (two) times daily as needed for moderate pain (pain score 4-6).   Yes [provider]  atorvastatin  (LIPITOR) 40 MG tablet Take 20 mg by mouth at bedtime. 06/28/23  Yes [provider]  Azelastine HCl 137 MCG/SPRAY SOLN Place 1 spray into both nostrils at bedtime. 12/05/20  Yes [provider]  cetirizine (ZYRTEC ALLERGY) 10 MG tablet Take 10 mg by mouth at bedtime.   Yes  [provider]  citalopram  (CELEXA ) 40 MG tablet Take 1 tablet (40 mg total) by mouth daily. 10/08/19 11/10/23 Yes Collier, Mylinda Asa R, PA-C  DEXTRAN 70-HYPROMELLOSE, PF, OP Place 1 drop into both eyes as needed (Dry Eye).   Yes [provider]  ELIQUIS  5 MG TABS tablet TAKE 1 TABLET BY MOUTH  TWICE DAILY Patient taking differently: Take 5 mg by mouth 2 (two) times daily. 04/07/20  Yes Croitoru, Mihai, MD  furosemide  (LASIX ) 20 MG tablet Take 10 mg by mouth daily. 02/15/23  Yes [provider]  LORazepam  (ATIVAN ) 0.5 MG tablet Take 0.25 mg by mouth 2 (two) times daily. 03/02/23  Yes [provider]  Menthol, Topical Analgesic, (BIOFREEZE COOL THE PAIN) 4 % GEL Apply 1 Application topically daily as needed (Pain).   Yes [provider]  nitroGLYCERIN  (NITROSTAT ) 0.4 MG SL tablet Place 1 tablet (0.4 mg total) under the tongue every 5 (five) minutes as needed for chest pain. 12/25/19  Yes Vangie Genet, MD  potassium chloride  SA (KLOR-CON ) 20 MEQ tablet Take 20 mEq by mouth 2 (two) times a week. Monday & Friday   Yes [provider]  propranolol  ER (INDERAL  LA) 160 MG SR capsule Take 160 mg by mouth daily. Do not give if systolic blood pressure is less than 100 or HR less than 60 06/02/23  Yes [provider]  senna-docusate (SENNA S) 8.6-50 MG tablet Take 2 tablets by mouth every other day.   Yes [provider]  tamsulosin  (FLOMAX ) 0.4 MG CAPS capsule Take 0.4 mg by mouth 2 (two) times daily. 02/21/23  Yes [provider]  White Petrolatum (VASELINE EX) Apply 1 Application topically at bedtime.   Yes [provider]  meclizine  (ANTIVERT ) 50 MG tablet Take 25 mg by mouth 2 (two) times daily. Patient not taking: Reported on 11/10/2023 06/30/23   [provider]    Social History   Socioeconomic History   Marital status: Widowed    Spouse name: Leola Raisin   Number of children: 3   Years of education: 14    Highest education level: Not on file  Occupational History   Occupation: Retired     Comment: retired  Tobacco Use   Smoking status: Former    Current packs/day: 0.00    Types: Cigarettes    Quit date: 07/19/1970    Years since quitting: 53.3   Smokeless tobacco: Never  Vaping Use   Vaping status: Never Used  Substance and Sexual Activity   Alcohol  use: No    Alcohol /week: 0.0 standard drinks  of alcohol    Drug use: No   Sexual activity: Not Currently  Other Topics Concern   Not on file  Social History Narrative   ** Merged History Encounter **       Drinks 1-2 cups of coffee a day, occasional coke    Social Drivers of Corporate investment banker Strain: Medium Risk (03/07/2018)   Overall Financial Resource Strain (CARDIA)    Difficulty of Paying Living Expenses: Somewhat hard  Food Insecurity: No Food Insecurity (11/10/2023)   Hunger Vital Sign    Worried About Running Out of Food in the Last Year: Never true    Ran Out of Food in the Last Year: Never true  Transportation Needs: No Transportation Needs (11/10/2023)   PRAPARE - Administrator, Civil Service (Medical): No    Lack of Transportation (Non-Medical): No  Physical Activity: Inactive (03/07/2018)   Exercise Vital Sign    Days of Exercise per Week: 0 days    Minutes of Exercise per Session: 0 min  Stress: Stress Concern Present (03/07/2018)   [employer] of Occupational Health - Occupational Stress Questionnaire    Feeling of Stress : To some extent  Social Connections: Moderately Integrated (11/10/2023)   Social Connection and Isolation Panel [NHANES]    Frequency of Communication with Friends and Family: More than three times a week    Frequency of Social Gatherings with Friends and Family: Twice a week    Attends Religious Services: More than 4 times per year    Active Member of [employer] or Organizations: No    Attends Banker Meetings: Never    Marital Status: Married  Careers information officer Violence: Not At Risk (11/10/2023)   Humiliation, Afraid, Rape, and Kick questionnaire    Fear of Current or Ex-Partner: No    Emotionally Abused: No    Physically Abused: No    Sexually Abused: No    Family History  Problem Relation Age of Onset   Cancer Father        bone   Cancer Brother        lymphnode    ROS: [x]  Positive   [ ]  Negative   [ ]  All sytems reviewed and are negative Cardiac: [x]  CAD with hx of CABG x 5  [x]  hx MI []  SOB   Vascular: [x]  some cramping with walking []  non-healing ulcers  Pulmonary: []  asthma/wheezing []  home O2  Neurologic: [x]  hx of CVA []  mini stroke   Hematologic: []  hx of cancer  Endocrine:   []  diabetes []  thyroid  disease  GI []  GERD  GU: [x]  BPH [x]  hx kidney stones  Psychiatric: []  anxiety []  depression  Musculoskeletal: [x]  arthritis []  joint pain  Integumentary: []  rashes []  ulcers  Constitutional: []  fever  []  chills  Physical Examination  Vitals:   11/11/23 0424 11/11/23 0913  BP: 123/64 (!) 111/58  Pulse: 70 63  Resp:  17  Temp: 97.8 F (36.6 C) 98.7 F (37.1 C)  SpO2: 98% 99%   Body mass index is 29.79 kg/m.  General:  WDWN in NAD Gait: Not observed HENT: WNL, normocephalic Pulmonary: normal non-labored breathing Cardiac: regular Skin: without rashes Vascular Exam/Pulses: Pedal pulses are not palpable Extremities: no ulcerations are present  on bilateral feet Musculoskeletal: no muscle wasting or atrophy  Neurologic: A&O X 3 Psychiatric:  The pt has Normal affect.   CBC    Component Value Date/Time   WBC 8.3 11/11/2023 0634  RBC 4.45 11/11/2023 0634   HGB 13.9 11/11/2023 0634   HCT 42.2 11/11/2023 0634   PLT 161 11/11/2023 0634   MCV 94.8 11/11/2023 0634   MCH 31.2 11/11/2023 0634   MCHC 32.9 11/11/2023 0634   RDW 15.6 (H) 11/11/2023 0634   LYMPHSABS 1.4 08/02/2023 0327   MONOABS 1.1 (H) 08/02/2023 0327   EOSABS 0.1 08/02/2023 0327   BASOSABS 0.0 08/02/2023  0327    BMET    Component Value Date/Time   NA 138 11/11/2023 0634   NA 142 03/13/2020 0909   K 4.5 11/11/2023 0634   CL 103 11/11/2023 0634   CO2 26 11/11/2023 0634   GLUCOSE 92 11/11/2023 0634   BUN 10 11/11/2023 0634   BUN 13 03/13/2020 0909   CREATININE 0.86 11/11/2023 0634   CREATININE 1.11 05/28/2020 1546   CALCIUM  8.9 11/11/2023 0634   GFRNONAA >60 11/11/2023 0634   GFRNONAA 60 05/28/2020 1546   GFRAA 70 05/28/2020 1546    COAGS: Lab Results  Component Value Date   INR 1.3 (H) 11/10/2023     Non-Invasive Vascular Imaging:   CTA head/neck 11/10/2023: IMPRESSION: 1. No intracranial arterial occlusion. 2. Occlusion of the right vertebral artery V1 and proximal V2 segments. 3. 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 Approximately 60% stenosis of the proximal left ICA due to predominantly noncalcified plaque. 5. Severe stenosis of the proximal right external carotid artery. 6. Thyromegaly.    ASSESSMENT/PLAN: This is a 89 y.o. male with visual changes left eye found to have webbing and ~ 60% left carotid artery stenosis and vascular surgery is consulted for further evaluation.   -pt has carotid duplex ordered for today.  CTA revealed webbing and 60% left ICA stenosis.  MRI revealed small left occipital infarct. He was also found to have a retinal artery occlusion. Vascular surgery consulted for further evaluation. -pt most likely would benefit from left carotid endarterectomy as he would need plavix with stenting and he is already on Eliquis .   -Dr. Fulton Job to evaluate pt and determine further plan   Maryanna Smart, PA-C [facility] [phone]  I have seen and evaluated the patient. I agree with the PA note as documented above.  89 year old male with CHF and atrial fibrillation on Eliquis  found to have left eye retinal occlusion after evaluation by his ophthalmologist.  CTA neck showed approximately 60% stenosis of the proximal  left ICA with web.  Patient has been evaluated by neurology who feels his left ICA is symptomatic.  His carotid duplex is pending.  I reviewed his images and I agree he would benefit from carotid revascularization.  I have offered him left carotid endarterectomy given he is on a DOAC and there is a concern for web.  Per neurology he likely will be discharged later today and outpatient surgery can be arranged.  I will have my office schedule left carotid endarterectomy likely Monday, May 5.  I will call and update his family.  He will need to hold his Eliquis  prior to surgery.  I discussed risk and benefits including 1 % risk of perioperative stroke including bleeding and cranial nerve injury in addition to risk of anesthesia.  Young Hensen, MD [facility] [place] Office: [phone]

## 2023-11-11 NOTE — Progress Notes (Signed)
 Patient off unit for MRI with SWOT RN Eldonna Greenspan for monitoring dt pacemaker, will assess next NIHSS upon patient return to unit.

## 2023-11-11 NOTE — Care Management Obs Status (Signed)
 MEDICARE OBSERVATION STATUS NOTIFICATION   Patient Details  Name: Paul Frazier. MRN: [id number] Date of Birth: 05/05/1933   Medicare Observation Status Notification Given:  Yes  OBS LETTER DONE  [name] 11/11/2023, 12:26 PM

## 2023-11-11 NOTE — Discharge Summary (Signed)
 Name: Paul Frazier. MRN: [id number] DOB: 01/05/1933 89 y.o. PCP: Pcp, No  Date of Admission: 11/10/2023 12:27 PM Date of Discharge:  11/11/23 Attending Physician: Dr.  Lelia Putnam  DISCHARGE DIAGNOSIS:  Primary Problem: Central retinal artery occlusion of left eye   Hospital Problems: Principal Problem:   Central retinal artery occlusion of left eye Acute punctate nonhemorrhagic cortical infarct of left occipital pole Acute punctate nonhemorrhagic infarct of posterior left corona radiata Acute nonhemorrhagic infarct of posterior left hippocampus  DISCHARGE MEDICATIONS:   Allergies as of 11/11/2023       Reactions   Erythromycin Nausea Only   Gabapentin  Other (See Comments)   Fluorouracil Rash, Other (See Comments)   Mouth ulcers        Medication List     STOP taking these medications    meclizine  50 MG tablet Commonly known as: ANTIVERT        TAKE these medications    acetaminophen  500 MG tablet Commonly known as: TYLENOL  Take 500 mg by mouth 2 (two) times daily as needed for moderate pain (pain score 4-6).   atorvastatin  40 MG tablet Commonly known as: LIPITOR Take 20 mg by mouth at bedtime.   Azelastine HCl 137 MCG/SPRAY Soln Place 1 spray into both nostrils at bedtime.   Biofreeze Cool The Pain 4 % Gel Generic drug: Menthol (Topical Analgesic) Apply 1 Application topically daily as needed (Pain).   citalopram  40 MG tablet Commonly known as: CELEXA  Take 1 tablet (40 mg total) by mouth daily.   DEXTRAN 70-HYPROMELLOSE (PF) OP Place 1 drop into both eyes as needed (Dry Eye).   Eliquis  5 MG Tabs tablet Generic drug: apixaban  TAKE 1 TABLET BY MOUTH  TWICE DAILY What changed: how much to take   furosemide  20 MG tablet Commonly known as: LASIX  Take 10 mg by mouth daily.   LORazepam  0.5 MG tablet Commonly known as: ATIVAN  Take 0.25 mg by mouth 2 (two) times daily.   nitroGLYCERIN  0.4 MG SL tablet Commonly known as: NITROSTAT  Place 1 tablet  (0.4 mg total) under the tongue every 5 (five) minutes as needed for chest pain.   potassium chloride  SA 20 MEQ tablet Commonly known as: KLOR-CON  M Take 20 mEq by mouth 2 (two) times a week. Monday & Friday   propranolol  ER 160 MG SR capsule Commonly known as: INDERAL  LA Take 160 mg by mouth daily. Do not give if systolic blood pressure is less than 100 or HR less than 60   Senna S 8.6-50 MG tablet Generic drug: senna-docusate Take 2 tablets by mouth every other day.   tamsulosin  0.4 MG Caps capsule Commonly known as: FLOMAX  Take 0.4 mg by mouth 2 (two) times daily.   VASELINE EX Apply 1 Application topically at bedtime.   ZyrTEC Allergy 10 MG tablet Generic drug: cetirizine Take 10 mg by mouth at bedtime.        DISPOSITION AND FOLLOW-UP:  [name]. was discharged from [facility] in Stable condition. At the hospital follow up visit please address:  Acute ischemic infarcts and multiple retinal artery branch emboli:  Follow up with neurology as an outpatient for stroke follow up Follow up with Dr. Fulton Job, vascular surgery, for elective left carotid endarterectomy as an outpatient Continue lipitor 20 mg daily and eliquis  5 mg bid (will hold prior to surgery) Needs to establish care with PCP - was going to establish with Cherene Core and also provided information on our Internal Medicine Center  2. Elevated PSA/BPH: follow up urinary symptoms and re-address if patient would like a prostate biopsy to work this up further (he has declined one in the past)  Follow-up Recommendations: Consults: Neurology, Vascular surgery Labs:  None Studies: None New Medications: None  Follow-up Appointments:  Follow-up Information     Lisabeth Rider, MD. Schedule an appointment as soon as possible for a visit.   Specialties: Neurology, Radiology Why: Follow up with  neurologist/stroke doctor Contact information: [address] [place]  [address] [phone]         Young Hensen, MD Follow up.   Specialty: Vascular Surgery Why: Dr. 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  He did not feel like a curtain.  It was painless.  His eye was not injected.  Retinal imaging showed branch retinal artery occlusions (multiple).  He was referred for further stroke workup there was concern for amaurosis fugax.  A CTA head and neck was performed which revealed the present stenosis of the left ICA as well as an arthrosclerotic web at the bifurcation of the left ICA.  It is thought that this web may be causing his visual symptoms.  An MRI was done and it showed acute nonhemorrhagic infarcts of the left occipital pole, posterior left corona radiata, and posterior left hippocampus. Lipid panel revealed an LDL at goal of 52, he is prediabetic with a hemoglobin A1c of 6, he is a former smoker but quit in 1972.  The stroke team was consulted and they recommended follow up with vascular surgery. Vascular surgery discussed doing an endarterectomy and that will be done tentatively around May 5th as an outpatient. The patient was discharged on his home Lipitor and Eliquis , with instructions to hold his eliquis  before the surgery.    Chronic medical problems: Paroxysmal Atrial fibrillation Hx of left bundle branch block, second-degree atrioventricular block, s/p pacemaker in August 2019.  Anticoagulated with Eliquis  5 milligrams twice daily.  His heart had a regular rate and rhythm during his admission.  Hyperlipidemia Currently on atorvastatin  20 mg daily, lipid profile was done and it revealed an LDL of 52.  Given that his LDL is at goal he was continued on this dose.    Systolic and diastolic heart failure Currently euvolemic.  Takes Lasix  10 mg daily.  Last echo was done in 2019.  It was repeated 11/11/2023 and it showed an EF of 30-35%, LV with regional wall motiona abnormalities with inferior akinesis, basal to mid inferolateral akinesis, and inferoseptal hypokinesis. Also revealed grade II diastolic dysfunction -- all of which were similar findings to his previous echo.    BPH Elevated PSA Admission he reported that he does have a very weak stream and that he eats will sometimes retain urine.  He had not gone to the bathroom when we were evaluating him at the ED.  We did do a  postvoid bladder scan which showed a postvoid volume in 120s.  In the past he has gotten a PSA measured and it was most recently elevated at around 60.  It seems that he has declined a biopsy in the past.  He is currently on Flomax  which she states that is minimally beneficial for him at this time.  Flomax  0.4 mg twice daily was continued during his admission but will require follow-up outpatient.  Unfortunately his PCP has passed away, and will need to establish with a new PCP.  Prediabetes A1c is 6 today.  May benefit from lifestyle modification, he is 89 years old.   DISCHARGE INSTRUCTIONS:   Discharge Instructions     Call MD for:   Complete by: As directed    Sudden vision loss, one-sided body weakness, facial droop/slurring of speech or other signs of a stroke   Call MD for:  difficulty breathing, headache or visual disturbances   Complete by: As directed    Call MD for:  persistant dizziness or light-headedness   Complete by: As directed    Diet general   Complete by: As directed     Discharge instructions   Complete by: As directed    Mr. Corrin, Hingle were hospitalized because you had a blockage in the blood vessel that goes to your eye (retinal artery) and had small strokes that affected your vision.   You will need to keep taking your blood thinner, Eliquis  5 mg TWICE a day to hopefully prevent this from happening again. The vascular surgeon who saw you, Dr. Fulton Job, discussed doing a carotid endarterectomy with you - this is a surgery to remove the plaque buildup from the carotid arteries (the main blood vessels that go to your brain) - and this surgery will hopefully prevent another stroke from happening again.   Please ask Dr. Spurgeon Dyer office when he would like you to STOP taking your eliquis  before this surgery - usually you have to stop eliquis  about 2 days before the surgery.  Continue to take your other medications as prescribed - we made no changes. Additionally, please find a primary care provider to follow up after being in the hospital. You mentioned you were going to establish with [facility] family medicine, so please give them a call. If you would like to see us  in our internal medicine clinic instead, you can call [phone] to set up an appointment. If you have any questions or concerns, call our clinic at [phone] or after hours call [phone] and ask for the internal medicine resident on call.  You will also need to follow up with the stroke doctor (neurologist), Dr. Janett Medin. His contact information is in this packet.  It was a pleasure to take care of you, sir!  Best, Dr. Verlene Glimpse   Increase activity slowly   Complete by: As directed        SUBJECTIVE:  Patient was seen prior to discharge and is eager to go home. His vision deficits are stable and he feels well overall.   Discharge Vitals:   BP (!) 111/58 (BP Location: Right Arm)   Pulse 63   Temp 98.7 F (37.1 C) (Oral)   Resp 17   Ht 5\' 5"  (1.651 m)   Wt 81.2 kg   SpO2 99%   BMI 29.79 kg/m    OBJECTIVE:  See physical exam from progress note today, 4/25  Pertinent Labs, Studies, and Procedures:     Latest Ref Rng & Units 11/11/2023  6:34 AM 11/10/2023    1:34 PM 08/02/2023    3:27 AM  CBC  WBC 4.0 - 10.5 K/uL 8.3  8.2  7.7   Hemoglobin 13.0 - 17.0 g/dL 81.1  91.4  78.2   Hematocrit 39.0 - 52.0 % 42.2  46.0  40.3   Platelets 150 - 400 K/uL 161  154  120        Latest Ref Rng & Units 11/11/2023    6:34 AM 11/10/2023    1:34 PM 08/02/2023    3:27 AM  CMP  Glucose 70 - 99 mg/dL 92  956  213   BUN 8 - 23 mg/dL 10  10  17    Creatinine 0.61 - 1.24 mg/dL 0.86  5.78  4.69   Sodium 135 - 145 mmol/L 138  138  135   Potassium 3.5 - 5.1 mmol/L 4.5  4.7  4.3   Chloride 98 - 111 mmol/L 103  102  104   CO2 22 - 32 mmol/L 26  28  22    Calcium  8.9 - 10.3 mg/dL 8.9  9.2  8.9   Total Protein 6.5 - 8.1 g/dL  6.8    Total Bilirubin 0.0 - 1.2 mg/dL  0.9    Alkaline Phos 38 - 126 U/L  82    AST 15 - 41 U/L  25    ALT 0 - 44 U/L  22      MR ORBITS W WO CONTRAST Result Date: 11/11/2023 CLINICAL DATA:  Blurred vision in the left eye. EXAM: MRI OF THE ORBITS WITHOUT AND WITH CONTRAST TECHNIQUE: Multiplanar, multi-echo pulse sequences of the orbits and surrounding structures were acquired including fat saturation techniques, before and after intravenous contrast administration. CONTRAST:  6mL GADAVIST  GADOBUTROL  1 MMOL/ML IV SOLN COMPARISON:  CT angio head and neck 11/10/2023. MR head without contrast 07/15/2017. FINDINGS: Orbits: Bilateral lens replacements are present. Globes are otherwise unremarkable. No mass lesion or focal enhancement is present. The extraocular muscles are within normal limits. Lacrimal gland is normal. Optic nerve is within normal limits bilaterally. Chiasm is unremarkable. Optic tracts are within normal limits. Visualized sinuses: The paranasal sinuses and mastoid air cells are clear. Soft tissues: The periorbital soft tissues are within normal limits. Limited  intracranial: Unremarkable. IMPRESSION: 1. Bilateral lens replacements. 2. Otherwise normal MRI appearance of the orbits. No acute or focal lesion to explain the patient's symptoms. Electronically Signed   By: Audree Leas M.D.   On: 11/11/2023 16:08   MR BRAIN WO CONTRAST Result Date: 11/11/2023 CLINICAL DATA:  Neuro deficit, stroke suspected. Blurred vision in the left eye. EXAM: MRI HEAD WITHOUT CONTRAST TECHNIQUE: Multiplanar, multiecho pulse sequences of the brain and surrounding structures were obtained without intravenous contrast. COMPARISON:  CT angio head and neck 11/10/2023. MR head without contrast 07/15/2017 FINDINGS: Brain: An acute punctate nonhemorrhagic cortical infarct is present in the left occipital pole on image 24 series 2. Associated T2 and FLAIR hyperintensity is noted. An acute punctate nonhemorrhagic infarct is present in the posterior left corona radiata on image 31 of series 2. A 5 mm acute nonhemorrhagic infarct is present the posterior left hippocampus on image 23 of series 2. A 9 mm acute nonhemorrhagic subcortical posterior parietal infarct is noted on image 35 of series 2. T2 and FLAIR signal hyperintensities are associated with each of these lesions. A remote cortical infarct is new from the prior MRI but not acute. Slight progression of periventricular and scattered subcortical T2 hyperintensities is noted bilaterally.  No acute hemorrhage or mass lesion is present. The ventricles are of normal size. No significant extraaxial fluid collection is present. Remote lacunar infarcts of the cerebellum are new since the prior exam, but not acute. Progressive white matter changes extend into the brainstem. The internal auditory canals are within normal limits. Midline structures are within normal limits. Vascular: Flow is present in the major intracranial arteries. Skull and upper cervical spine: The craniocervical junction is normal. Upper cervical spine is within normal limits.  Marrow signal is unremarkable. Sinuses/Orbits: The paranasal sinuses and mastoid air cells are clear. Bilateral lens replacements are noted. Globes and orbits are otherwise unremarkable. Other: IMPRESSION: 1. Acute punctate nonhemorrhagic cortical infarct of the left occipital pole. 2. Acute punctate nonhemorrhagic infarct of the posterior left corona radiata. 3. Acute 5 mm nonhemorrhagic infarct of the posterior left hippocampus. 4. A 9 mm subcortical posterior parietal infarct. 5. Slight progression of periventricular and scattered subcortical T2 hyperintensities bilaterally. This likely reflects the sequela of chronic microvascular ischemia. 6. Remote cortical infarct of the left parietal lobe is new from the prior MRI but not acute. 7. Remote lacunar infarcts of the cerebellum are new since the prior exam, but not acute. The above was relayed via text pager to Dr. Janett Medin On 11/11/2023 at 16:02 . Electronically Signed   By: Audree Leas M.D.   On: 11/11/2023 16:02   ECHOCARDIOGRAM COMPLETE Result Date: 11/11/2023    ECHOCARDIOGRAM REPORT   Patient Name:   Arshad Oberholzer. Date of Exam: 11/11/2023 Medical Rec #:  [id number]           Height:       65.0 in Accession #:    [id number]          Weight:       179.0 lb Date of Birth:  01-Feb-1933            BSA:          1.887 m Patient Age:    89 years            BP:           111/58 mmHg Patient Gender: M                   HR:           71 bpm. Exam Location:  Inpatient Procedure: 2D Echo, Cardiac Doppler and Color Doppler (Both Spectral and Color            Flow Doppler were utilized during procedure). Indications:    Stroke  History:        Patient has prior history of Echocardiogram examinations, most                 recent 03/08/2018. Cardiomyopathy, CAD, Pacemaker,                 Arrythmias:LBBB; Risk Factors:Hypertension and Dyslipidemia.                 Complete heart block.  Sonographer:    Juanita Shaw Referring Phys: Jaeger Trueheart N Zinnia Tindall IMPRESSIONS  1.  Left ventricular ejection fraction, by estimation, is 30 to 35%. The left ventricle has moderately decreased function. The left ventricle demonstrates regional wall motion abnormalities with inferior akinesis, basal to mid inferolateral akinesis, and  inferoseptal hypokinesis. The left ventricular internal cavity size was mildly dilated. Left ventricular diastolic parameters are consistent with Grade II diastolic dysfunction (pseudonormalization).  2. Right ventricular systolic function is  mildly reduced. The right ventricular size is normal. Tricuspid regurgitation signal is inadequate for assessing PA pressure.  3. Left atrial size was mildly dilated.  4. Right atrial size was mildly dilated.  5. The mitral valve is normal in structure. Trivial mitral valve regurgitation. No evidence of mitral stenosis.  6. The aortic valve is tricuspid. There is mild calcification of the aortic valve. Aortic valve regurgitation is not visualized. No aortic stenosis is present.  7. The inferior vena cava is normal in size with greater than 50% respiratory variability, suggesting right atrial pressure of 3 mmHg. FINDINGS  Left Ventricle: Left ventricular ejection fraction, by estimation, is 30 to 35%. The left ventricle has moderately decreased function. The left ventricle demonstrates regional wall motion abnormalities. The left ventricular internal cavity size was mildly dilated. There is no left ventricular hypertrophy. Left ventricular diastolic parameters are consistent with Grade II diastolic dysfunction (pseudonormalization). Right Ventricle: The right ventricular size is normal. No increase in right ventricular wall thickness. Right ventricular systolic function is mildly reduced. Tricuspid regurgitation signal is inadequate for assessing PA pressure. Left Atrium: Left atrial size was mildly dilated. Right Atrium: Right atrial size was mildly dilated. Pericardium: There is no evidence of pericardial effusion. Mitral Valve:  The mitral valve is normal in structure. Trivial mitral valve regurgitation. No evidence of mitral valve stenosis. MV peak gradient, 3.9 mmHg. The mean mitral valve gradient is 1.0 mmHg. Tricuspid Valve: The tricuspid valve is normal in structure. Tricuspid valve regurgitation is trivial. Aortic Valve: The aortic valve is tricuspid. There is mild calcification of the aortic valve. Aortic valve regurgitation is not visualized. No aortic stenosis is present. Aortic valve mean gradient measures 2.0 mmHg. Aortic valve peak gradient measures 3.0 mmHg. Aortic valve area, by VTI measures 1.79 cm. Pulmonic Valve: The pulmonic valve was normal in structure. Pulmonic valve regurgitation is trivial. Aorta: The aortic root is normal in size and structure. Venous: The inferior vena cava is normal in size with greater than 50% respiratory variability, suggesting right atrial pressure of 3 mmHg. IAS/Shunts: No atrial level shunt detected by color flow Doppler. Additional Comments: A device lead is visualized in the right ventricle.  LEFT VENTRICLE PLAX 2D LVIDd:         5.40 cm      Diastology LVIDs:         4.50 cm      LV e' medial:    4.20 cm/s LV PW:         0.90 cm      LV E/e' medial:  23.6 LV IVS:        0.70 cm      LV e' lateral:   6.00 cm/s LVOT diam:     1.90 cm      LV E/e' lateral: 16.5 LV SV:         32 LV SV Index:   17 LVOT Area:     2.84 cm  LV Volumes (MOD) LV vol d, MOD A2C: 148.0 ml LV vol d, MOD A4C: 178.0 ml LV vol s, MOD A2C: 81.4 ml LV vol s, MOD A4C: 108.0 ml LV SV MOD A2C:     66.6 ml LV SV MOD A4C:     178.0 ml LV SV MOD BP:      68.2 ml RIGHT VENTRICLE            IVC RV Basal diam:  4.50 cm    IVC diam: 1.40 cm RV Mid diam:  2.70 cm RV S prime:     6.68 cm/s TAPSE (M-mode): 1.6 cm LEFT ATRIUM           Index        RIGHT ATRIUM           Index LA diam:      3.90 cm 2.07 cm/m   RA Area:     21.50 cm LA Vol (A4C): 52.7 ml 27.93 ml/m  RA Volume:   52.30 ml  27.71 ml/m  AORTIC VALVE                     PULMONIC VALVE AV Area (Vmax):    1.82 cm     PV Vmax:       0.69 m/s AV Area (Vmean):   1.77 cm     PV Peak grad:  1.9 mmHg AV Area (VTI):     1.79 cm AV Vmax:           85.90 cm/s AV Vmean:          59.600 cm/s AV VTI:            0.179 m AV Peak Grad:      3.0 mmHg AV Mean Grad:      2.0 mmHg LVOT Vmax:         55.10 cm/s LVOT Vmean:        37.200 cm/s LVOT VTI:          0.113 m LVOT/AV VTI ratio: 0.63  AORTA Ao Root diam: 3.10 cm Ao Asc diam:  2.50 cm MITRAL VALVE MV Area (PHT): 3.74 cm    SHUNTS MV Area VTI:   1.13 cm    Systemic VTI:  0.11 m MV Peak grad:  3.9 mmHg    Systemic Diam: 1.90 cm MV Mean grad:  1.0 mmHg MV Vmax:       0.99 m/s MV Vmean:      54.6 cm/s MV Decel Time: 203 msec MV E velocity: 99.00 cm/s MV A velocity: 51.90 cm/s MV E/A ratio:  1.91 Dalton McleanMD Electronically signed by Archer Bear Signature Date/Time: 11/11/2023/2:13:50 PM    Final    CT ANGIO HEAD NECK W WO CM Result Date: 11/10/2023 CLINICAL DATA:  Monocular vision loss EXAM: CT ANGIOGRAPHY HEAD AND NECK WITH AND WITHOUT CONTRAST TECHNIQUE: Multidetector CT imaging of the head and neck was performed using the standard protocol during bolus administration of intravenous contrast. Multiplanar CT image reconstructions and MIPs were obtained to evaluate the vascular anatomy. Carotid stenosis measurements (when applicable) are obtained utilizing NASCET criteria, using the distal internal carotid diameter as the denominator. RADIATION DOSE REDUCTION: This exam was performed according to the departmental dose-optimization program which includes automated exposure control, adjustment of the mA and/or kV according to patient size and/or use of iterative reconstruction technique. CONTRAST:  75mL OMNIPAQUE  IOHEXOL  350 MG/ML SOLN COMPARISON:  None Available. FINDINGS: CT HEAD FINDINGS Brain: No mass,hemorrhage or extra-axial collection. Normal appearance of the parenchyma and CSF spaces. Vascular: No hyperdense vessel or  unexpected vascular calcification. Skull: The visualized skull base, calvarium and extracranial soft tissues are normal. Sinuses/Orbits: No fluid levels or advanced mucosal thickening of the visualized paranasal sinuses. No mastoid or middle ear effusion. Normal orbits. CTA NECK FINDINGS Skeleton: No acute abnormality or high grade bony spinal canal stenosis. Other neck: Normal pharynx, larynx and major salivary glands. No cervical lymphadenopathy. Thyromegaly. Upper chest: No pneumothorax or pleural effusion. No nodules or masses. Aortic  arch: There is calcific atherosclerosis of the aortic arch. Conventional 3 vessel aortic branching pattern. RIGHT carotid system: No dissection, occlusion or aneurysm. There is mixed density atherosclerosis extending into the proximal ICA, resulting in less than 50% stenosis. There is severe stenosis of the proximal right external carotid artery. LEFT carotid system: Atherosclerotic web at the carotid bifurcation. Approximately 60% stenosis of the proximal ICA due to predominantly noncalcified plaque. Vertebral arteries: Codominant configuration. The right vertebral artery V1 and proximal V2 segments are occluded. The remainder of the right vertebral artery is normal. The left vertebral artery is normal. CTA HEAD FINDINGS POSTERIOR CIRCULATION: Vertebral arteries are normal. No proximal occlusion of the anterior or inferior cerebellar arteries. Basilar artery is normal. Superior cerebellar arteries are normal. Posterior cerebral arteries are normal. ANTERIOR CIRCULATION: Atherosclerotic calcification of the internal carotid arteries at the skull base without hemodynamically significant stenosis. Anterior cerebral arteries are normal. Middle cerebral arteries are normal. Venous sinuses: As permitted by contrast timing, patent. Anatomic variants: None Review of the MIP images confirms the above findings. IMPRESSION: 1. No intracranial arterial occlusion. 2. Occlusion of the right  vertebral artery V1 and proximal V2 segments. 3. 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 Approximately 60% stenosis of the proximal left ICA due to predominantly noncalcified plaque. 5. Severe stenosis of the proximal right external carotid artery. 6. Thyromegaly. Aortic Atherosclerosis (ICD10-I70.0). Electronically Signed   By: Juanetta Nordmann M.D.   On: 11/10/2023 21:26     Signed: Keiara Sneeringer, D.O.  Internal Medicine Resident, PGY-3 [facility]  Pager: [phone] 4:50 PM, 11/11/2023

## 2023-11-12 ENCOUNTER — Inpatient Hospital Stay (HOSPITAL_COMMUNITY): Admit: 2023-11-12 | Admitting: Vascular Surgery

## 2023-11-12 DIAGNOSIS — I63232 Cerebral infarction due to unspecified occlusion or stenosis of left carotid arteries: Secondary | ICD-10-CM

## 2023-11-12 SURGERY — ENDARTERECTOMY, CAROTID
Anesthesia: Choice | Laterality: Left

## 2023-11-14 ENCOUNTER — Telehealth: Payer: Self-pay | Admitting: Vascular Surgery

## 2023-11-14 DIAGNOSIS — E785 Hyperlipidemia, unspecified: Secondary | ICD-10-CM | POA: Diagnosis not present

## 2023-11-14 DIAGNOSIS — I6522 Occlusion and stenosis of left carotid artery: Secondary | ICD-10-CM | POA: Diagnosis not present

## 2023-11-14 DIAGNOSIS — I480 Paroxysmal atrial fibrillation: Secondary | ICD-10-CM | POA: Diagnosis not present

## 2023-11-14 DIAGNOSIS — F411 Generalized anxiety disorder: Secondary | ICD-10-CM | POA: Diagnosis not present

## 2023-11-14 DIAGNOSIS — H3412 Central retinal artery occlusion, left eye: Secondary | ICD-10-CM | POA: Diagnosis not present

## 2023-11-14 DIAGNOSIS — F331 Major depressive disorder, recurrent, moderate: Secondary | ICD-10-CM | POA: Diagnosis not present

## 2023-11-14 NOTE — Telephone Encounter (Signed)
 I was notified by my office of the patients plan to cancel his carotid surgery for Monday.  I called and discussed indications again with a 60% left symptomatic carotid stenosis with web.  I discussed that the web creates a much higher risk morphology for recurrent stroke.  Discussed the next stroke would be much more debilitating.  Ultimately he understands these risks and does not want surgery at this time.  We agreed to arrange follow-up in 1 month to further discuss.  Young Hensen, MD [facility] [place] Office: [phone]  Young Hensen

## 2023-11-15 NOTE — Addendum Note (Signed)
 Addended by: Lott Rouleau A on: 11/15/2023 10:02 AM   Modules accepted: Orders

## 2023-11-15 NOTE — Progress Notes (Signed)
 Remote pacemaker transmission.

## 2023-11-18 DIAGNOSIS — M5459 Other low back pain: Secondary | ICD-10-CM | POA: Diagnosis not present

## 2023-11-18 DIAGNOSIS — M6258 Muscle wasting and atrophy, not elsewhere classified, other site: Secondary | ICD-10-CM | POA: Diagnosis not present

## 2023-11-18 DIAGNOSIS — M6281 Muscle weakness (generalized): Secondary | ICD-10-CM | POA: Diagnosis not present

## 2023-11-18 DIAGNOSIS — R2689 Other abnormalities of gait and mobility: Secondary | ICD-10-CM | POA: Diagnosis not present

## 2023-11-21 DIAGNOSIS — M6281 Muscle weakness (generalized): Secondary | ICD-10-CM | POA: Diagnosis not present

## 2023-11-21 DIAGNOSIS — R2689 Other abnormalities of gait and mobility: Secondary | ICD-10-CM | POA: Diagnosis not present

## 2023-11-21 DIAGNOSIS — M6258 Muscle wasting and atrophy, not elsewhere classified, other site: Secondary | ICD-10-CM | POA: Diagnosis not present

## 2023-11-21 DIAGNOSIS — M5459 Other low back pain: Secondary | ICD-10-CM | POA: Diagnosis not present

## 2023-11-22 DIAGNOSIS — M6281 Muscle weakness (generalized): Secondary | ICD-10-CM | POA: Diagnosis not present

## 2023-11-22 DIAGNOSIS — R2689 Other abnormalities of gait and mobility: Secondary | ICD-10-CM | POA: Diagnosis not present

## 2023-11-22 DIAGNOSIS — M25572 Pain in left ankle and joints of left foot: Secondary | ICD-10-CM | POA: Diagnosis not present

## 2023-11-22 DIAGNOSIS — M6258 Muscle wasting and atrophy, not elsewhere classified, other site: Secondary | ICD-10-CM | POA: Diagnosis not present

## 2023-11-22 DIAGNOSIS — M79672 Pain in left foot: Secondary | ICD-10-CM | POA: Diagnosis not present

## 2023-11-22 DIAGNOSIS — M5459 Other low back pain: Secondary | ICD-10-CM | POA: Diagnosis not present

## 2023-11-23 DIAGNOSIS — H3412 Central retinal artery occlusion, left eye: Secondary | ICD-10-CM | POA: Diagnosis not present

## 2023-11-23 DIAGNOSIS — I4892 Unspecified atrial flutter: Secondary | ICD-10-CM | POA: Diagnosis not present

## 2023-11-23 DIAGNOSIS — R972 Elevated prostate specific antigen [PSA]: Secondary | ICD-10-CM | POA: Diagnosis not present

## 2023-11-23 DIAGNOSIS — R2689 Other abnormalities of gait and mobility: Secondary | ICD-10-CM | POA: Diagnosis not present

## 2023-11-23 DIAGNOSIS — M6258 Muscle wasting and atrophy, not elsewhere classified, other site: Secondary | ICD-10-CM | POA: Diagnosis not present

## 2023-11-23 DIAGNOSIS — F329 Major depressive disorder, single episode, unspecified: Secondary | ICD-10-CM | POA: Diagnosis not present

## 2023-11-23 DIAGNOSIS — M5459 Other low back pain: Secondary | ICD-10-CM | POA: Diagnosis not present

## 2023-11-23 DIAGNOSIS — M6281 Muscle weakness (generalized): Secondary | ICD-10-CM | POA: Diagnosis not present

## 2023-11-24 DIAGNOSIS — M6258 Muscle wasting and atrophy, not elsewhere classified, other site: Secondary | ICD-10-CM | POA: Diagnosis not present

## 2023-11-24 DIAGNOSIS — M6281 Muscle weakness (generalized): Secondary | ICD-10-CM | POA: Diagnosis not present

## 2023-11-24 DIAGNOSIS — R2689 Other abnormalities of gait and mobility: Secondary | ICD-10-CM | POA: Diagnosis not present

## 2023-11-24 DIAGNOSIS — M5459 Other low back pain: Secondary | ICD-10-CM | POA: Diagnosis not present

## 2023-11-25 DIAGNOSIS — M6281 Muscle weakness (generalized): Secondary | ICD-10-CM | POA: Diagnosis not present

## 2023-11-25 DIAGNOSIS — R2689 Other abnormalities of gait and mobility: Secondary | ICD-10-CM | POA: Diagnosis not present

## 2023-11-25 DIAGNOSIS — M6258 Muscle wasting and atrophy, not elsewhere classified, other site: Secondary | ICD-10-CM | POA: Diagnosis not present

## 2023-11-25 DIAGNOSIS — M5459 Other low back pain: Secondary | ICD-10-CM | POA: Diagnosis not present

## 2023-11-28 DIAGNOSIS — M6281 Muscle weakness (generalized): Secondary | ICD-10-CM | POA: Diagnosis not present

## 2023-11-28 DIAGNOSIS — M5459 Other low back pain: Secondary | ICD-10-CM | POA: Diagnosis not present

## 2023-11-28 DIAGNOSIS — M6258 Muscle wasting and atrophy, not elsewhere classified, other site: Secondary | ICD-10-CM | POA: Diagnosis not present

## 2023-11-29 DIAGNOSIS — M6258 Muscle wasting and atrophy, not elsewhere classified, other site: Secondary | ICD-10-CM | POA: Diagnosis not present

## 2023-11-29 DIAGNOSIS — M6281 Muscle weakness (generalized): Secondary | ICD-10-CM | POA: Diagnosis not present

## 2023-11-29 DIAGNOSIS — R2689 Other abnormalities of gait and mobility: Secondary | ICD-10-CM | POA: Diagnosis not present

## 2023-11-29 DIAGNOSIS — M5459 Other low back pain: Secondary | ICD-10-CM | POA: Diagnosis not present

## 2023-12-01 DIAGNOSIS — M6258 Muscle wasting and atrophy, not elsewhere classified, other site: Secondary | ICD-10-CM | POA: Diagnosis not present

## 2023-12-01 DIAGNOSIS — M5459 Other low back pain: Secondary | ICD-10-CM | POA: Diagnosis not present

## 2023-12-01 DIAGNOSIS — I739 Peripheral vascular disease, unspecified: Secondary | ICD-10-CM | POA: Diagnosis not present

## 2023-12-01 DIAGNOSIS — L603 Nail dystrophy: Secondary | ICD-10-CM | POA: Diagnosis not present

## 2023-12-01 DIAGNOSIS — M6281 Muscle weakness (generalized): Secondary | ICD-10-CM | POA: Diagnosis not present

## 2023-12-01 DIAGNOSIS — L602 Onychogryphosis: Secondary | ICD-10-CM | POA: Diagnosis not present

## 2023-12-01 DIAGNOSIS — R2689 Other abnormalities of gait and mobility: Secondary | ICD-10-CM | POA: Diagnosis not present

## 2023-12-02 DIAGNOSIS — M6281 Muscle weakness (generalized): Secondary | ICD-10-CM | POA: Diagnosis not present

## 2023-12-02 DIAGNOSIS — M6258 Muscle wasting and atrophy, not elsewhere classified, other site: Secondary | ICD-10-CM | POA: Diagnosis not present

## 2023-12-02 DIAGNOSIS — R2689 Other abnormalities of gait and mobility: Secondary | ICD-10-CM | POA: Diagnosis not present

## 2023-12-02 DIAGNOSIS — M5459 Other low back pain: Secondary | ICD-10-CM | POA: Diagnosis not present

## 2023-12-07 DIAGNOSIS — M6281 Muscle weakness (generalized): Secondary | ICD-10-CM | POA: Diagnosis not present

## 2023-12-07 DIAGNOSIS — R2689 Other abnormalities of gait and mobility: Secondary | ICD-10-CM | POA: Diagnosis not present

## 2023-12-07 DIAGNOSIS — M5459 Other low back pain: Secondary | ICD-10-CM | POA: Diagnosis not present

## 2023-12-07 DIAGNOSIS — M6258 Muscle wasting and atrophy, not elsewhere classified, other site: Secondary | ICD-10-CM | POA: Diagnosis not present

## 2023-12-08 DIAGNOSIS — M6258 Muscle wasting and atrophy, not elsewhere classified, other site: Secondary | ICD-10-CM | POA: Diagnosis not present

## 2023-12-08 DIAGNOSIS — M5459 Other low back pain: Secondary | ICD-10-CM | POA: Diagnosis not present

## 2023-12-08 DIAGNOSIS — M6281 Muscle weakness (generalized): Secondary | ICD-10-CM | POA: Diagnosis not present

## 2023-12-08 DIAGNOSIS — R2689 Other abnormalities of gait and mobility: Secondary | ICD-10-CM | POA: Diagnosis not present

## 2023-12-09 DIAGNOSIS — M6258 Muscle wasting and atrophy, not elsewhere classified, other site: Secondary | ICD-10-CM | POA: Diagnosis not present

## 2023-12-09 DIAGNOSIS — M6281 Muscle weakness (generalized): Secondary | ICD-10-CM | POA: Diagnosis not present

## 2023-12-09 DIAGNOSIS — M5459 Other low back pain: Secondary | ICD-10-CM | POA: Diagnosis not present

## 2023-12-09 DIAGNOSIS — R2689 Other abnormalities of gait and mobility: Secondary | ICD-10-CM | POA: Diagnosis not present

## 2023-12-13 ENCOUNTER — Encounter: Payer: Self-pay | Admitting: Vascular Surgery

## 2023-12-13 ENCOUNTER — Ambulatory Visit: Attending: Vascular Surgery | Admitting: Vascular Surgery

## 2023-12-13 VITALS — BP 114/60 | HR 66 | Temp 97.6°F | Resp 20 | Ht 65.0 in | Wt 176.6 lb

## 2023-12-13 DIAGNOSIS — I6523 Occlusion and stenosis of bilateral carotid arteries: Secondary | ICD-10-CM

## 2023-12-13 DIAGNOSIS — I779 Disorder of arteries and arterioles, unspecified: Secondary | ICD-10-CM | POA: Insufficient documentation

## 2023-12-13 NOTE — Progress Notes (Signed)
 Patient name: Paul Frazier. MRN: [id number] DOB: 18-Aug-1932 Sex: male  REASON FOR CONSULT: Hospital follow-up to discuss carotid intervention  HPI: Paul Vasco. is a 89 y.o. male, with history of CHF (EF 30-35%) and A-fib on Eliquis  that presents for follow-up to discuss carotid surgery.  Previously seen on 11/11/2023 with a symptomatic left carotid stenosis of 60% with associated web.  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  Also had an MRI showing left brain infarct.  He was offered left carotid endarterectomy during hospitalization and patient canceled surgery and elected he did not want surgery.  Today states he is doing well and in his facility at [facility]  Still has some left eye visual problems but no new neurologic symptoms since discharge.  States he still does not want any carotid surgery.    Past Medical History:  Diagnosis Date   Arthritis    CAD (coronary artery disease)    Cancer (HCC)    Hyperlipidemia    Hypertension    Kidney stones    Old myocardial infarct    Right inguinal hernia 04/19/2012   S/P CABG x 5 08/29/1984   LIMA to LAD,SVG to diagonal,SVG to OM,SVG to PDA and PLA   Tremor, essential     Past Surgical History:  Procedure Laterality Date   A-FLUTTER ABLATION N/A 03/09/2018   Procedure: A-FLUTTER ABLATION;  Surgeon: Tammie Fall, MD;  Location: [facility] INVASIVE CV LAB;  Service: Cardiovascular;  Laterality: N/A;   CHOLECYSTECTOMY  10/1990   CORONARY ARTERY BYPASS GRAFT  1986   x5 LIMA to LAD,SVG to diagonal,SVG to OM,SVG to PDA and PLA   CORONARY STENT PLACEMENT     INGUINAL HERNIA REPAIR  04/25/2012   right- laparoscopic   PACEMAKER IMPLANT N/A 03/09/2018   Procedure: PACEMAKER IMPLANT;  Surgeon: Tammie Fall, MD;  Location: [facility] INVASIVE CV LAB;  Service: Cardiovascular;  Laterality: N/A;   R/P Myoview   12/27/2008   no ischemia   TEE WITHOUT CARDIOVERSION N/A 03/09/2018   Procedure: TRANSESOPHAGEAL  ECHOCARDIOGRAM (TEE);  Surgeon: Elmyra Haggard, MD;  Location: [facility] ENDOSCOPY;  Service: Cardiovascular;  Laterality: N/A;  Bubble Study   US  ECHOCARDIOGRAPHY  12/27/2008   concentric LVH,mild to mod MR, TR and pulmonary hypertension. EF 45-50%    Family History  Problem Relation Age of Onset   Cancer Father        bone   Cancer Brother        lymphnode    SOCIAL HISTORY: Social History   Socioeconomic History   Marital status: Widowed    Spouse name: Paul Frazier   Number of children: 3   Years of education: 14   Highest education level: Not on file  Occupational History   Occupation: Retired     Comment: retired  Tobacco Use   Smoking status: Former    Current packs/day: 0.00    Types: Cigarettes    Quit date: 07/19/1970    Years since quitting: 53.4   Smokeless tobacco: Never  Vaping Use   Vaping status: Never Used  Substance and Sexual Activity   Alcohol  use: No    Alcohol /week: 0.0 standard drinks of alcohol    Drug use: No   Sexual activity: Not Currently  Other Topics Concern   Not on file  Social History Narrative   ** Merged History Encounter **       Drinks 1-2 cups of coffee a day, occasional coke  Social Drivers of Health   Financial Resource Strain: Medium Risk (03/07/2018)   Overall Financial Resource Strain (CARDIA)    Difficulty of Paying Living Expenses: Somewhat hard  Food Insecurity: No Food Insecurity (11/10/2023)   Hunger Vital Sign    Worried About Running Out of Food in the Last Year: Never true    Ran Out of Food in the Last Year: Never true  Transportation Needs: No Transportation Needs (11/10/2023)   PRAPARE - Administrator, Civil Service (Medical): No    Lack of Transportation (Non-Medical): No  Physical Activity: Inactive (03/07/2018)   Exercise Vital Sign    Days of Exercise per Week: 0 days    Minutes of Exercise per Session: 0 min  Stress: Stress Concern Present (03/07/2018)   [employer] of Occupational Health -  Occupational Stress Questionnaire    Feeling of Stress : To some extent  Social Connections: Moderately Integrated (11/10/2023)   Social Connection and Isolation Panel [NHANES]    Frequency of Communication with Friends and Family: More than three times a week    Frequency of Social Gatherings with Friends and Family: Twice a week    Attends Religious Services: More than 4 times per year    Active Member of [employer] or Organizations: No    Attends Banker Meetings: Never    Marital Status: Married  Catering manager Violence: Not At Risk (11/10/2023)   Humiliation, Afraid, Rape, and Kick questionnaire    Fear of Current or Ex-Partner: No    Emotionally Abused: No    Physically Abused: No    Sexually Abused: No    Allergies  Allergen Reactions   Erythromycin Nausea Only   Gabapentin  Other (See Comments)   Fluorouracil Rash and Other (See Comments)    Mouth ulcers    Current Outpatient Medications  Medication Sig Dispense Refill   acetaminophen  (TYLENOL ) 500 MG tablet Take 500 mg by mouth 2 (two) times daily as needed for moderate pain (pain score 4-6).     atorvastatin  (LIPITOR) 40 MG tablet Take 20 mg by mouth at bedtime.     Azelastine HCl 137 MCG/SPRAY SOLN Place 1 spray into both nostrils at bedtime.     cetirizine (ZYRTEC ALLERGY) 10 MG tablet Take 10 mg by mouth at bedtime.     DEXTRAN 70-HYPROMELLOSE, PF, OP Place 1 drop into both eyes as needed (Dry Eye).     ELIQUIS  5 MG TABS tablet TAKE 1 TABLET BY MOUTH  TWICE DAILY (Patient taking differently: Take 5 mg by mouth 2 (two) times daily.) 180 tablet 3   furosemide  (LASIX ) 20 MG tablet Take 10 mg by mouth daily.     Menthol, Topical Analgesic, (BIOFREEZE COOL THE PAIN) 4 % GEL Apply 1 Application topically daily as needed (Pain).     nitroGLYCERIN  (NITROSTAT ) 0.4 MG SL tablet Place 1 tablet (0.4 mg total) under the tongue every 5 (five) minutes as needed for chest pain. 25 tablet 3   potassium chloride  SA (KLOR-CON )  20 MEQ tablet Take 20 mEq by mouth 2 (two) times a week. Monday & Friday     propranolol  ER (INDERAL  LA) 160 MG SR capsule Take 160 mg by mouth daily. Do not give if systolic blood pressure is less than 100 or HR less than 60     senna-docusate (SENNA S) 8.6-50 MG tablet Take 2 tablets by mouth every other day.     tamsulosin  (FLOMAX ) 0.4 MG CAPS capsule Take 0.4 mg by  mouth 2 (two) times daily.     White Petrolatum (VASELINE EX) Apply 1 Application topically at bedtime.     citalopram  (CELEXA ) 40 MG tablet Take 1 tablet (40 mg total) by mouth daily. 90 tablet 2   LORazepam  (ATIVAN ) 0.5 MG tablet Take 0.25 mg by mouth 2 (two) times daily. (Patient not taking: Reported on 12/13/2023)     No current facility-administered medications for this visit.    REVIEW OF SYSTEMS:  [X]  denotes positive finding, [ ]  denotes negative finding Cardiac  Comments:  Chest pain or chest pressure:    Shortness of breath upon exertion:    Short of breath when lying flat:    Irregular heart rhythm:        Vascular    Pain in calf, thigh, or hip brought on by ambulation:    Pain in feet at night that wakes you up from your sleep:     Blood clot in your veins:    Leg swelling:         Pulmonary    Oxygen  at home:    Productive cough:     Wheezing:         Neurologic    Sudden weakness in arms or legs:     Sudden numbness in arms or legs:     Sudden onset of difficulty speaking or slurred speech:    Temporary loss of vision in one eye:     Problems with dizziness:         Gastrointestinal    Blood in stool:     Vomited blood:         Genitourinary    Burning when urinating:     Blood in urine:        Psychiatric    Major depression:         Hematologic    Bleeding problems:    Problems with blood clotting too easily:        Skin    Rashes or ulcers:        Constitutional    Fever or chills:      PHYSICAL EXAM: Vitals:   12/13/23 1455 12/13/23 1459  BP: 112/68 114/60  Pulse: 66    Resp: 20   Temp: 97.6 F (36.4 C)   TempSrc: Temporal   SpO2: 96%   Weight: 176 lb 9.6 oz (80.1 kg)   Height: 5\' 5"  (1.651 m)     GENERAL: The patient is a well-nourished male, in no acute distress. The vital signs are documented above. CARDIAC: There is a regular rate and rhythm.  PULMONARY: No respiratory distress. ABDOMEN: Soft and non-tender. MUSCULOSKELETAL: There are no major deformities or cyanosis. NEUROLOGIC: No focal weakness or paresthesias are detected.  Cranial nerves II through XII grossly intact.  RUE 5/5, RLE 5/5, LUE 5/5, LLE 5/5 SKIN: There are no ulcers or rashes noted. PSYCHIATRIC: The patient has a normal affect.  DATA:   CTA neck 11/10/2023 with 60% stenosis of the proximal left ICA with web  MRI brain 11/11/2023 with acute cortical infarct of the left occipital, posterior left corona radiata, posterior left hippocampus  Assessment/Plan:   89 y.o. male, with history of CHF (EF 30-35%) and A-fib on Eliquis  that presents for follow-up to discuss carotid surgery.  Previously seen on 11/11/2023 with a symptomatic left carotid stenosis of 60% with associated web.  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  Also had an MRI showing left brain  infarct.  He was offered left carotid endarterectomy during recent hospitalization and patient canceled surgery and elected he did not want surgery.  I again discussed indication for left carotid revascularization today and for stroke risk reduction.  Neurology felt his left carotid web was the symptomatic lesion for his left brain and retinal stroke.  I again offered him surgery today.  He states he has a clear understanding of the indications but does not want surgery at his age.  He would prefer to pursue medical management.  I have asked that he start 81 mg aspirin  daily in addition to his Eliquis  and statin therapy.  I will see him in 6 months with carotid duplex.  I discussed he let me know if he  changes his mind.   Young Hensen, MD [facility] [place] Office: [phone]

## 2023-12-14 DIAGNOSIS — M6281 Muscle weakness (generalized): Secondary | ICD-10-CM | POA: Diagnosis not present

## 2023-12-14 DIAGNOSIS — M5459 Other low back pain: Secondary | ICD-10-CM | POA: Diagnosis not present

## 2023-12-14 DIAGNOSIS — M6258 Muscle wasting and atrophy, not elsewhere classified, other site: Secondary | ICD-10-CM | POA: Diagnosis not present

## 2023-12-14 DIAGNOSIS — R2689 Other abnormalities of gait and mobility: Secondary | ICD-10-CM | POA: Diagnosis not present

## 2023-12-15 ENCOUNTER — Other Ambulatory Visit: Payer: Self-pay | Admitting: *Deleted

## 2023-12-15 DIAGNOSIS — I6523 Occlusion and stenosis of bilateral carotid arteries: Secondary | ICD-10-CM

## 2023-12-15 DIAGNOSIS — I6522 Occlusion and stenosis of left carotid artery: Secondary | ICD-10-CM

## 2023-12-16 DIAGNOSIS — M6281 Muscle weakness (generalized): Secondary | ICD-10-CM | POA: Diagnosis not present

## 2023-12-16 DIAGNOSIS — R2689 Other abnormalities of gait and mobility: Secondary | ICD-10-CM | POA: Diagnosis not present

## 2023-12-16 DIAGNOSIS — M5459 Other low back pain: Secondary | ICD-10-CM | POA: Diagnosis not present

## 2023-12-16 DIAGNOSIS — M6258 Muscle wasting and atrophy, not elsewhere classified, other site: Secondary | ICD-10-CM | POA: Diagnosis not present

## 2023-12-19 DIAGNOSIS — M6258 Muscle wasting and atrophy, not elsewhere classified, other site: Secondary | ICD-10-CM | POA: Diagnosis not present

## 2023-12-21 DIAGNOSIS — M6281 Muscle weakness (generalized): Secondary | ICD-10-CM | POA: Diagnosis not present

## 2023-12-21 DIAGNOSIS — M5459 Other low back pain: Secondary | ICD-10-CM | POA: Diagnosis not present

## 2023-12-21 DIAGNOSIS — R2689 Other abnormalities of gait and mobility: Secondary | ICD-10-CM | POA: Diagnosis not present

## 2023-12-21 DIAGNOSIS — M6258 Muscle wasting and atrophy, not elsewhere classified, other site: Secondary | ICD-10-CM | POA: Diagnosis not present

## 2023-12-22 DIAGNOSIS — M6281 Muscle weakness (generalized): Secondary | ICD-10-CM | POA: Diagnosis not present

## 2023-12-22 DIAGNOSIS — M6258 Muscle wasting and atrophy, not elsewhere classified, other site: Secondary | ICD-10-CM | POA: Diagnosis not present

## 2023-12-23 DIAGNOSIS — M6281 Muscle weakness (generalized): Secondary | ICD-10-CM | POA: Diagnosis not present

## 2023-12-23 DIAGNOSIS — M6258 Muscle wasting and atrophy, not elsewhere classified, other site: Secondary | ICD-10-CM | POA: Diagnosis not present

## 2023-12-23 DIAGNOSIS — M5459 Other low back pain: Secondary | ICD-10-CM | POA: Diagnosis not present

## 2023-12-23 DIAGNOSIS — R2689 Other abnormalities of gait and mobility: Secondary | ICD-10-CM | POA: Diagnosis not present

## 2023-12-26 DIAGNOSIS — M5459 Other low back pain: Secondary | ICD-10-CM | POA: Diagnosis not present

## 2023-12-26 DIAGNOSIS — M6281 Muscle weakness (generalized): Secondary | ICD-10-CM | POA: Diagnosis not present

## 2023-12-26 DIAGNOSIS — R2689 Other abnormalities of gait and mobility: Secondary | ICD-10-CM | POA: Diagnosis not present

## 2023-12-26 DIAGNOSIS — M6258 Muscle wasting and atrophy, not elsewhere classified, other site: Secondary | ICD-10-CM | POA: Diagnosis not present

## 2023-12-27 ENCOUNTER — Ambulatory Visit (INDEPENDENT_AMBULATORY_CARE_PROVIDER_SITE_OTHER): Payer: Self-pay

## 2023-12-27 DIAGNOSIS — I442 Atrioventricular block, complete: Secondary | ICD-10-CM

## 2023-12-28 DIAGNOSIS — M5459 Other low back pain: Secondary | ICD-10-CM | POA: Diagnosis not present

## 2023-12-28 DIAGNOSIS — R2689 Other abnormalities of gait and mobility: Secondary | ICD-10-CM | POA: Diagnosis not present

## 2023-12-28 DIAGNOSIS — M6258 Muscle wasting and atrophy, not elsewhere classified, other site: Secondary | ICD-10-CM | POA: Diagnosis not present

## 2023-12-28 DIAGNOSIS — M6281 Muscle weakness (generalized): Secondary | ICD-10-CM | POA: Diagnosis not present

## 2023-12-28 LAB — CUP PACEART REMOTE DEVICE CHECK
Battery Remaining Longevity: 13 mo
Battery Voltage: 2.84 V
Brady Statistic AP VP Percent: 97.52 %
Brady Statistic AP VS Percent: 0.04 %
Brady Statistic AS VP Percent: 2.04 %
Brady Statistic AS VS Percent: 0.39 %
Brady Statistic RA Percent Paced: 97.79 %
Brady Statistic RV Percent Paced: 99.57 %
Date Time Interrogation Session: 20250610031928
Implantable Lead Connection Status: 753985
Implantable Lead Connection Status: 753985
Implantable Lead Implant Date: 20190822
Implantable Lead Implant Date: 20190822
Implantable Lead Location: 753859
Implantable Lead Location: 753860
Implantable Lead Model: 3830
Implantable Lead Model: 5076
Implantable Pulse Generator Implant Date: 20190822
Lead Channel Impedance Value: 304 Ohm
Lead Channel Impedance Value: 304 Ohm
Lead Channel Impedance Value: 361 Ohm
Lead Channel Impedance Value: 399 Ohm
Lead Channel Pacing Threshold Amplitude: 0.75 V
Lead Channel Pacing Threshold Amplitude: 1.5 V
Lead Channel Pacing Threshold Pulse Width: 0.4 ms
Lead Channel Pacing Threshold Pulse Width: 0.4 ms
Lead Channel Sensing Intrinsic Amplitude: 3.375 mV
Lead Channel Sensing Intrinsic Amplitude: 3.375 mV
Lead Channel Sensing Intrinsic Amplitude: 5.125 mV
Lead Channel Sensing Intrinsic Amplitude: 5.125 mV
Lead Channel Setting Pacing Amplitude: 1.5 V
Lead Channel Setting Pacing Amplitude: 2.5 V
Lead Channel Setting Pacing Pulse Width: 1 ms
Lead Channel Setting Sensing Sensitivity: 2.8 mV
Zone Setting Status: 755011
Zone Setting Status: 755011

## 2023-12-29 DIAGNOSIS — R296 Repeated falls: Secondary | ICD-10-CM | POA: Diagnosis not present

## 2023-12-29 DIAGNOSIS — M4802 Spinal stenosis, cervical region: Secondary | ICD-10-CM | POA: Diagnosis not present

## 2023-12-29 DIAGNOSIS — M6281 Muscle weakness (generalized): Secondary | ICD-10-CM | POA: Diagnosis not present

## 2023-12-29 DIAGNOSIS — I4892 Unspecified atrial flutter: Secondary | ICD-10-CM | POA: Diagnosis not present

## 2023-12-29 DIAGNOSIS — M5459 Other low back pain: Secondary | ICD-10-CM | POA: Diagnosis not present

## 2023-12-29 DIAGNOSIS — R338 Other retention of urine: Secondary | ICD-10-CM | POA: Diagnosis not present

## 2023-12-29 DIAGNOSIS — M6258 Muscle wasting and atrophy, not elsewhere classified, other site: Secondary | ICD-10-CM | POA: Diagnosis not present

## 2023-12-29 DIAGNOSIS — R2689 Other abnormalities of gait and mobility: Secondary | ICD-10-CM | POA: Diagnosis not present

## 2023-12-30 DIAGNOSIS — M5459 Other low back pain: Secondary | ICD-10-CM | POA: Diagnosis not present

## 2023-12-30 DIAGNOSIS — R2689 Other abnormalities of gait and mobility: Secondary | ICD-10-CM | POA: Diagnosis not present

## 2023-12-30 DIAGNOSIS — M6258 Muscle wasting and atrophy, not elsewhere classified, other site: Secondary | ICD-10-CM | POA: Diagnosis not present

## 2023-12-30 DIAGNOSIS — M6281 Muscle weakness (generalized): Secondary | ICD-10-CM | POA: Diagnosis not present

## 2024-01-01 ENCOUNTER — Ambulatory Visit: Payer: Self-pay | Admitting: Internal Medicine

## 2024-01-04 DIAGNOSIS — R2689 Other abnormalities of gait and mobility: Secondary | ICD-10-CM | POA: Diagnosis not present

## 2024-01-04 DIAGNOSIS — M6281 Muscle weakness (generalized): Secondary | ICD-10-CM | POA: Diagnosis not present

## 2024-01-04 DIAGNOSIS — M6258 Muscle wasting and atrophy, not elsewhere classified, other site: Secondary | ICD-10-CM | POA: Diagnosis not present

## 2024-01-04 DIAGNOSIS — M5459 Other low back pain: Secondary | ICD-10-CM | POA: Diagnosis not present

## 2024-01-05 DIAGNOSIS — M6281 Muscle weakness (generalized): Secondary | ICD-10-CM | POA: Diagnosis not present

## 2024-01-05 DIAGNOSIS — R2689 Other abnormalities of gait and mobility: Secondary | ICD-10-CM | POA: Diagnosis not present

## 2024-01-05 DIAGNOSIS — M5459 Other low back pain: Secondary | ICD-10-CM | POA: Diagnosis not present

## 2024-01-05 DIAGNOSIS — M6258 Muscle wasting and atrophy, not elsewhere classified, other site: Secondary | ICD-10-CM | POA: Diagnosis not present

## 2024-01-09 DIAGNOSIS — M6258 Muscle wasting and atrophy, not elsewhere classified, other site: Secondary | ICD-10-CM | POA: Diagnosis not present

## 2024-01-09 DIAGNOSIS — M5459 Other low back pain: Secondary | ICD-10-CM | POA: Diagnosis not present

## 2024-01-09 DIAGNOSIS — M6281 Muscle weakness (generalized): Secondary | ICD-10-CM | POA: Diagnosis not present

## 2024-01-09 DIAGNOSIS — R2689 Other abnormalities of gait and mobility: Secondary | ICD-10-CM | POA: Diagnosis not present

## 2024-01-10 DIAGNOSIS — R972 Elevated prostate specific antigen [PSA]: Secondary | ICD-10-CM | POA: Diagnosis not present

## 2024-01-11 DIAGNOSIS — M6258 Muscle wasting and atrophy, not elsewhere classified, other site: Secondary | ICD-10-CM | POA: Diagnosis not present

## 2024-01-11 DIAGNOSIS — M6281 Muscle weakness (generalized): Secondary | ICD-10-CM | POA: Diagnosis not present

## 2024-01-11 DIAGNOSIS — M5459 Other low back pain: Secondary | ICD-10-CM | POA: Diagnosis not present

## 2024-01-11 DIAGNOSIS — R2689 Other abnormalities of gait and mobility: Secondary | ICD-10-CM | POA: Diagnosis not present

## 2024-01-12 DIAGNOSIS — R2689 Other abnormalities of gait and mobility: Secondary | ICD-10-CM | POA: Diagnosis not present

## 2024-01-12 DIAGNOSIS — M6258 Muscle wasting and atrophy, not elsewhere classified, other site: Secondary | ICD-10-CM | POA: Diagnosis not present

## 2024-01-12 DIAGNOSIS — M5459 Other low back pain: Secondary | ICD-10-CM | POA: Diagnosis not present

## 2024-01-12 DIAGNOSIS — M6281 Muscle weakness (generalized): Secondary | ICD-10-CM | POA: Diagnosis not present

## 2024-01-13 DIAGNOSIS — M5459 Other low back pain: Secondary | ICD-10-CM | POA: Diagnosis not present

## 2024-01-13 DIAGNOSIS — R2689 Other abnormalities of gait and mobility: Secondary | ICD-10-CM | POA: Diagnosis not present

## 2024-01-13 DIAGNOSIS — M6281 Muscle weakness (generalized): Secondary | ICD-10-CM | POA: Diagnosis not present

## 2024-01-13 DIAGNOSIS — M6258 Muscle wasting and atrophy, not elsewhere classified, other site: Secondary | ICD-10-CM | POA: Diagnosis not present

## 2024-01-16 DIAGNOSIS — M6258 Muscle wasting and atrophy, not elsewhere classified, other site: Secondary | ICD-10-CM | POA: Diagnosis not present

## 2024-01-16 DIAGNOSIS — Z7901 Long term (current) use of anticoagulants: Secondary | ICD-10-CM | POA: Diagnosis not present

## 2024-01-16 DIAGNOSIS — I5040 Unspecified combined systolic (congestive) and diastolic (congestive) heart failure: Secondary | ICD-10-CM | POA: Diagnosis not present

## 2024-01-16 DIAGNOSIS — R296 Repeated falls: Secondary | ICD-10-CM | POA: Diagnosis not present

## 2024-01-16 DIAGNOSIS — M5459 Other low back pain: Secondary | ICD-10-CM | POA: Diagnosis not present

## 2024-01-16 DIAGNOSIS — R2689 Other abnormalities of gait and mobility: Secondary | ICD-10-CM | POA: Diagnosis not present

## 2024-01-16 DIAGNOSIS — I4892 Unspecified atrial flutter: Secondary | ICD-10-CM | POA: Diagnosis not present

## 2024-01-16 DIAGNOSIS — M6281 Muscle weakness (generalized): Secondary | ICD-10-CM | POA: Diagnosis not present

## 2024-01-17 DIAGNOSIS — R310 Gross hematuria: Secondary | ICD-10-CM | POA: Diagnosis not present

## 2024-01-17 DIAGNOSIS — N403 Nodular prostate with lower urinary tract symptoms: Secondary | ICD-10-CM | POA: Diagnosis not present

## 2024-01-17 DIAGNOSIS — R3912 Poor urinary stream: Secondary | ICD-10-CM | POA: Diagnosis not present

## 2024-01-17 DIAGNOSIS — M6281 Muscle weakness (generalized): Secondary | ICD-10-CM | POA: Diagnosis not present

## 2024-01-17 DIAGNOSIS — R3121 Asymptomatic microscopic hematuria: Secondary | ICD-10-CM | POA: Diagnosis not present

## 2024-01-17 DIAGNOSIS — R2689 Other abnormalities of gait and mobility: Secondary | ICD-10-CM | POA: Diagnosis not present

## 2024-01-17 DIAGNOSIS — M5459 Other low back pain: Secondary | ICD-10-CM | POA: Diagnosis not present

## 2024-01-18 ENCOUNTER — Telehealth: Payer: Self-pay

## 2024-01-18 DIAGNOSIS — R2689 Other abnormalities of gait and mobility: Secondary | ICD-10-CM | POA: Diagnosis not present

## 2024-01-18 DIAGNOSIS — M6281 Muscle weakness (generalized): Secondary | ICD-10-CM | POA: Diagnosis not present

## 2024-01-18 DIAGNOSIS — M5459 Other low back pain: Secondary | ICD-10-CM | POA: Diagnosis not present

## 2024-01-18 DIAGNOSIS — M6258 Muscle wasting and atrophy, not elsewhere classified, other site: Secondary | ICD-10-CM | POA: Diagnosis not present

## 2024-01-18 NOTE — Telephone Encounter (Signed)
 Spoke with Harlene from [facility] and she wanted to know what happened to pt's appointment scheduled for 01/26/2024, let her know that pt called and canceled his appointment on 12/19/2023 due to him not being able to make it at 9:45am and it was rescheduled for 03/27/2024 @ 1:15pm with Dr. Buck.

## 2024-01-20 DIAGNOSIS — M5459 Other low back pain: Secondary | ICD-10-CM | POA: Diagnosis not present

## 2024-01-20 DIAGNOSIS — M6281 Muscle weakness (generalized): Secondary | ICD-10-CM | POA: Diagnosis not present

## 2024-01-20 DIAGNOSIS — R2689 Other abnormalities of gait and mobility: Secondary | ICD-10-CM | POA: Diagnosis not present

## 2024-01-23 DIAGNOSIS — M6258 Muscle wasting and atrophy, not elsewhere classified, other site: Secondary | ICD-10-CM | POA: Diagnosis not present

## 2024-01-23 DIAGNOSIS — M6281 Muscle weakness (generalized): Secondary | ICD-10-CM | POA: Diagnosis not present

## 2024-01-23 DIAGNOSIS — R2689 Other abnormalities of gait and mobility: Secondary | ICD-10-CM | POA: Diagnosis not present

## 2024-01-23 DIAGNOSIS — M5459 Other low back pain: Secondary | ICD-10-CM | POA: Diagnosis not present

## 2024-01-24 DIAGNOSIS — R2689 Other abnormalities of gait and mobility: Secondary | ICD-10-CM | POA: Diagnosis not present

## 2024-01-24 DIAGNOSIS — M5459 Other low back pain: Secondary | ICD-10-CM | POA: Diagnosis not present

## 2024-01-24 DIAGNOSIS — M6281 Muscle weakness (generalized): Secondary | ICD-10-CM | POA: Diagnosis not present

## 2024-01-26 ENCOUNTER — Inpatient Hospital Stay: Admitting: Neurology

## 2024-01-31 DIAGNOSIS — M5459 Other low back pain: Secondary | ICD-10-CM | POA: Diagnosis not present

## 2024-01-31 DIAGNOSIS — M25552 Pain in left hip: Secondary | ICD-10-CM | POA: Diagnosis not present

## 2024-01-31 DIAGNOSIS — R2689 Other abnormalities of gait and mobility: Secondary | ICD-10-CM | POA: Diagnosis not present

## 2024-01-31 DIAGNOSIS — G250 Essential tremor: Secondary | ICD-10-CM | POA: Diagnosis not present

## 2024-01-31 DIAGNOSIS — R296 Repeated falls: Secondary | ICD-10-CM | POA: Diagnosis not present

## 2024-01-31 DIAGNOSIS — M6281 Muscle weakness (generalized): Secondary | ICD-10-CM | POA: Diagnosis not present

## 2024-01-31 DIAGNOSIS — W19XXXA Unspecified fall, initial encounter: Secondary | ICD-10-CM | POA: Diagnosis not present

## 2024-02-01 DIAGNOSIS — L603 Nail dystrophy: Secondary | ICD-10-CM | POA: Diagnosis not present

## 2024-02-01 DIAGNOSIS — L84 Corns and callosities: Secondary | ICD-10-CM | POA: Diagnosis not present

## 2024-02-01 DIAGNOSIS — R2689 Other abnormalities of gait and mobility: Secondary | ICD-10-CM | POA: Diagnosis not present

## 2024-02-01 DIAGNOSIS — M5459 Other low back pain: Secondary | ICD-10-CM | POA: Diagnosis not present

## 2024-02-01 DIAGNOSIS — L602 Onychogryphosis: Secondary | ICD-10-CM | POA: Diagnosis not present

## 2024-02-01 DIAGNOSIS — M6281 Muscle weakness (generalized): Secondary | ICD-10-CM | POA: Diagnosis not present

## 2024-02-01 DIAGNOSIS — M79652 Pain in left thigh: Secondary | ICD-10-CM | POA: Diagnosis not present

## 2024-02-02 DIAGNOSIS — N2889 Other specified disorders of kidney and ureter: Secondary | ICD-10-CM | POA: Diagnosis not present

## 2024-02-02 DIAGNOSIS — K8689 Other specified diseases of pancreas: Secondary | ICD-10-CM | POA: Diagnosis not present

## 2024-02-02 DIAGNOSIS — R3121 Asymptomatic microscopic hematuria: Secondary | ICD-10-CM | POA: Diagnosis not present

## 2024-02-02 DIAGNOSIS — N200 Calculus of kidney: Secondary | ICD-10-CM | POA: Diagnosis not present

## 2024-02-03 DIAGNOSIS — R2689 Other abnormalities of gait and mobility: Secondary | ICD-10-CM | POA: Diagnosis not present

## 2024-02-03 DIAGNOSIS — M5459 Other low back pain: Secondary | ICD-10-CM | POA: Diagnosis not present

## 2024-02-03 DIAGNOSIS — M6258 Muscle wasting and atrophy, not elsewhere classified, other site: Secondary | ICD-10-CM | POA: Diagnosis not present

## 2024-02-03 DIAGNOSIS — M6281 Muscle weakness (generalized): Secondary | ICD-10-CM | POA: Diagnosis not present

## 2024-02-06 DIAGNOSIS — M6281 Muscle weakness (generalized): Secondary | ICD-10-CM | POA: Diagnosis not present

## 2024-02-06 DIAGNOSIS — F331 Major depressive disorder, recurrent, moderate: Secondary | ICD-10-CM | POA: Diagnosis not present

## 2024-02-06 DIAGNOSIS — M5459 Other low back pain: Secondary | ICD-10-CM | POA: Diagnosis not present

## 2024-02-06 DIAGNOSIS — R2689 Other abnormalities of gait and mobility: Secondary | ICD-10-CM | POA: Diagnosis not present

## 2024-02-06 DIAGNOSIS — F411 Generalized anxiety disorder: Secondary | ICD-10-CM | POA: Diagnosis not present

## 2024-02-07 DIAGNOSIS — M6281 Muscle weakness (generalized): Secondary | ICD-10-CM | POA: Diagnosis not present

## 2024-02-07 DIAGNOSIS — R2689 Other abnormalities of gait and mobility: Secondary | ICD-10-CM | POA: Diagnosis not present

## 2024-02-07 DIAGNOSIS — M6258 Muscle wasting and atrophy, not elsewhere classified, other site: Secondary | ICD-10-CM | POA: Diagnosis not present

## 2024-02-07 DIAGNOSIS — M5459 Other low back pain: Secondary | ICD-10-CM | POA: Diagnosis not present

## 2024-02-08 DIAGNOSIS — M5459 Other low back pain: Secondary | ICD-10-CM | POA: Diagnosis not present

## 2024-02-08 DIAGNOSIS — R2689 Other abnormalities of gait and mobility: Secondary | ICD-10-CM | POA: Diagnosis not present

## 2024-02-08 DIAGNOSIS — M6258 Muscle wasting and atrophy, not elsewhere classified, other site: Secondary | ICD-10-CM | POA: Diagnosis not present

## 2024-02-08 DIAGNOSIS — M6281 Muscle weakness (generalized): Secondary | ICD-10-CM | POA: Diagnosis not present

## 2024-02-09 DIAGNOSIS — M5459 Other low back pain: Secondary | ICD-10-CM | POA: Diagnosis not present

## 2024-02-09 DIAGNOSIS — R2689 Other abnormalities of gait and mobility: Secondary | ICD-10-CM | POA: Diagnosis not present

## 2024-02-09 DIAGNOSIS — M6281 Muscle weakness (generalized): Secondary | ICD-10-CM | POA: Diagnosis not present

## 2024-02-10 DIAGNOSIS — R2689 Other abnormalities of gait and mobility: Secondary | ICD-10-CM | POA: Diagnosis not present

## 2024-02-10 DIAGNOSIS — M6281 Muscle weakness (generalized): Secondary | ICD-10-CM | POA: Diagnosis not present

## 2024-02-10 DIAGNOSIS — M5459 Other low back pain: Secondary | ICD-10-CM | POA: Diagnosis not present

## 2024-02-13 DIAGNOSIS — M6258 Muscle wasting and atrophy, not elsewhere classified, other site: Secondary | ICD-10-CM | POA: Diagnosis not present

## 2024-02-13 DIAGNOSIS — R2689 Other abnormalities of gait and mobility: Secondary | ICD-10-CM | POA: Diagnosis not present

## 2024-02-13 DIAGNOSIS — M5459 Other low back pain: Secondary | ICD-10-CM | POA: Diagnosis not present

## 2024-02-13 DIAGNOSIS — M6281 Muscle weakness (generalized): Secondary | ICD-10-CM | POA: Diagnosis not present

## 2024-02-14 DIAGNOSIS — M5459 Other low back pain: Secondary | ICD-10-CM | POA: Diagnosis not present

## 2024-02-14 DIAGNOSIS — R2689 Other abnormalities of gait and mobility: Secondary | ICD-10-CM | POA: Diagnosis not present

## 2024-02-14 DIAGNOSIS — M6281 Muscle weakness (generalized): Secondary | ICD-10-CM | POA: Diagnosis not present

## 2024-02-15 DIAGNOSIS — R2689 Other abnormalities of gait and mobility: Secondary | ICD-10-CM | POA: Diagnosis not present

## 2024-02-15 DIAGNOSIS — M5459 Other low back pain: Secondary | ICD-10-CM | POA: Diagnosis not present

## 2024-02-15 DIAGNOSIS — M6258 Muscle wasting and atrophy, not elsewhere classified, other site: Secondary | ICD-10-CM | POA: Diagnosis not present

## 2024-02-15 DIAGNOSIS — M6281 Muscle weakness (generalized): Secondary | ICD-10-CM | POA: Diagnosis not present

## 2024-02-16 ENCOUNTER — Ambulatory Visit: Attending: Cardiovascular Disease | Admitting: Cardiovascular Disease

## 2024-02-16 ENCOUNTER — Encounter: Payer: Self-pay | Admitting: Cardiovascular Disease

## 2024-02-16 VITALS — BP 120/58 | HR 95 | Ht 65.0 in | Wt 177.4 lb

## 2024-02-16 DIAGNOSIS — Z950 Presence of cardiac pacemaker: Secondary | ICD-10-CM

## 2024-02-16 DIAGNOSIS — I6522 Occlusion and stenosis of left carotid artery: Secondary | ICD-10-CM

## 2024-02-16 DIAGNOSIS — I5042 Chronic combined systolic (congestive) and diastolic (congestive) heart failure: Secondary | ICD-10-CM | POA: Diagnosis not present

## 2024-02-16 DIAGNOSIS — I25708 Atherosclerosis of coronary artery bypass graft(s), unspecified, with other forms of angina pectoris: Secondary | ICD-10-CM

## 2024-02-16 DIAGNOSIS — I4892 Unspecified atrial flutter: Secondary | ICD-10-CM | POA: Diagnosis not present

## 2024-02-16 DIAGNOSIS — I442 Atrioventricular block, complete: Secondary | ICD-10-CM

## 2024-02-16 DIAGNOSIS — E785 Hyperlipidemia, unspecified: Secondary | ICD-10-CM

## 2024-02-16 DIAGNOSIS — I6501 Occlusion and stenosis of right vertebral artery: Secondary | ICD-10-CM

## 2024-02-16 DIAGNOSIS — I10 Essential (primary) hypertension: Secondary | ICD-10-CM

## 2024-02-16 DIAGNOSIS — I951 Orthostatic hypotension: Secondary | ICD-10-CM

## 2024-02-16 NOTE — Progress Notes (Signed)
 Patient ID: Paul Frazier., male   DOB: Oct 09, 1932, 89 y.o.   MRN: [id number]      Cardiology Office Note   Date:  02/16/2024   ID:  Paul Frazier., DOB 08-Apr-1933, MRN [id number]  PCP:  Pcp, No  Cardiologist:   Paul Balding, MD   No chief complaint on file.     History of Present Illness: Paul Frazier. is a 89 y.o. male who presents for follow-up on multiple cardiac problems.  This is his first cardiology visit since 03-09-2021.  His wife Paul Frazier was also my patient, she passed away in 03/09/22.  He had a gradual decline in over many years due to worsening physical and cognitive issues with a further drastic reduction after prolonged hospitalization for COVID-19 pneumonia in Mar 09, 2021.  He has been a resident at [facility] ever since.  He was hospitalized in April 2025 with left central retinal artery occlusion and also had evidence of acute punctate infarctions of the left occipital pole right corona radiata and left hippocampus.  This occurred even though he was on Eliquis  anticoagulation.  He was referred for left carotid endarterectomy, but he decided not to have surgery.  At that time, he was still on Eliquis  anticoagulation.  He has good metabolic control: LDL was 52 and hemoglobin J8r was 6%.  He has not smoked in the last 50 years.   During that hospitalization he underwent a transthoracic echocardiogram that showed stable findings with LVEF 30-35% and wall motion abnormalities in the territories of the right coronary and left circumflex coronary arteries.  The study described pseudonormal mitral inflow and mildly decreased right ventricular systolic function.  Both atria were mildly dilated.  There were no serious valve abnormalities.  He has not had recent serious falls, but remains unsteady and prone to falling.  Pacemaker interrogation shows normal device function.  His Medtronic azure device implanted in 03/09/18 has approximately 1 year of remaining longevity.  Able to prolong that  slightly to 1.2 years by adjusting to the ventricular rate pacemaker output which was set rather high (2.5 V at 1.0 ms).  Pacing threshold today was 1.0 V at 0.6 ms.  Pacing output was changed to 2.0 V at 0.6 ms.  He has not had any atrial fibrillation in the last many months.  He has 98.5% atrial pacing and 98.8% ventricular pacing.  There have been no high ventricular rates.  He has a dual-chamber pacemaker and his right ventricular lead is a His bundle/septal location.  His current generator was implanted in 03-09-18.    He has a history of coronary artery disease and previous bypass surgery with subsequent graft failure and myocardial infarction, moderate to severely depressed left ventricular systolic function with EF around 30% and well compensated heart failure, nonsustained ventricular tachycardia, atrial flutter status post radiofrequency ablation, paroxysmal atrial fibrillation, left bundle branch block, second-degree atrioventricular block, pacemaker with septal His bundle ventricular lead (March 09, 2018, Dr. Waddell, Medtronic Azure), history of subdural hematoma in 03/09/12.  He had atrial flutter ablation, followed by development of complete heart block.  He underwent implantation of a dual-chamber permanent pacemaker with a His bundle septal ventricular lead on March 09, 2018.   Ventricular outputs were reduced to chronic settings today.  His estimated generator longevity after these changes is now 8.8 years.  Prior to his RF ablation he underwent a transesophageal echo on August 21 which showed left ventricular ejection fraction of 30-35% with inferior and inferoseptal  akinesis and the mildly dilated left atrium, mild mitral regurgitation, moderate tricuspid regurgitation.  A nuclear stress test performed on April 12, 2018 shows a similar pattern of LVEF 30% with inferior scar, incomplete reversible ischemia.  He has a complicated history of coronary disease and underwent bypass surgery in  1986. He has total occlusion of his LAD artery and his dominant left circumflex coronary artery is graft dependent. He presented with a moderate size non-ST segment elevation myocardial infarction in the setting of paroxysmal supraventricular tachycardia in October 2012 and was treated in [place], [place] . The catheterization report is a little confusing since it describes grafts with different locations than those described at the time of his bypass procedure, however it appears that he has lost the sequential bypass to the oblique marginal artery. A more recent nuclear stress test (May 2014) showed that his left ventricular ejection fraction was down to 42% with inferolateral and anterolateral scar and hypokinesia but without any reversible ischemic abnormalities.  I offered cardiac catheterization at that time since I was worried that he had developed disease in the sequential saphenous vein graft to the PDA/PLA, but he preferred conservative management.  A nuclear stress test in September 2019 shows basal-mid inferior, mostly fixed defect, EF 30%.  He underwent radiofrequency ablation for atrial flutter in August 2019, complicated by complete heart block and leading to implantation of a dual-chamber permanent pacemaker with a His bundle lead (Medtronic Azure).  He had long-standing left bundle branch block before that.   Past Medical History:  Diagnosis Date   Arthritis    CAD (coronary artery disease)    Cancer (HCC)    Hyperlipidemia    Hypertension    Kidney stones    Old myocardial infarct    Right inguinal hernia 04/19/2012   S/P CABG x 5 08/29/1984   LIMA to LAD,SVG to diagonal,SVG to OM,SVG to PDA and PLA   Tremor, essential     Past Surgical History:  Procedure Laterality Date   A-FLUTTER ABLATION N/A 03/09/2018   Procedure: A-FLUTTER ABLATION;  Surgeon: Paul Frazier ORN, MD;  [facility] INVASIVE CV LAB;  Service: Cardiovascular;  Laterality: N/A;   CHOLECYSTECTOMY  10/1990    CORONARY ARTERY BYPASS GRAFT  1986   x5 LIMA to LAD,SVG to diagonal,SVG to OM,SVG to PDA and PLA   CORONARY STENT PLACEMENT     INGUINAL HERNIA REPAIR  04/25/2012   right- laparoscopic   PACEMAKER IMPLANT N/A 03/09/2018   Procedure: PACEMAKER IMPLANT;  Surgeon: Paul Frazier ORN, MD;  Location: [facility] INVASIVE CV LAB;  Service: Cardiovascular;  Laterality: N/A;   R/P Myoview   12/27/2008   no ischemia   TEE WITHOUT CARDIOVERSION N/A 03/09/2018   Procedure: TRANSESOPHAGEAL ECHOCARDIOGRAM (TEE);  Surgeon: Okey Vina GAILS, MD;  Location: [facility] ENDOSCOPY;  Service: Cardiovascular;  Laterality: N/A;  Bubble Study   US  ECHOCARDIOGRAPHY  12/27/2008   concentric LVH,mild to mod MR, TR and pulmonary hypertension. EF 45-50%     Current Outpatient Medications  Medication Sig Dispense Refill   acetaminophen  (TYLENOL ) 500 MG tablet Take 500 mg by mouth 2 (two) times daily as needed for moderate pain (pain score 4-6).     atorvastatin  (LIPITOR) 20 MG tablet Take 20 mg by mouth daily.     atorvastatin  (LIPITOR) 40 MG tablet Take 20 mg by mouth at bedtime.     Azelastine HCl 137 MCG/SPRAY SOLN Place 1 spray into both nostrils at bedtime.     cetirizine (ZYRTEC ALLERGY) 10 MG  tablet Take 10 mg by mouth at bedtime.     citalopram  (CELEXA ) 20 MG tablet Take 20 mg by mouth daily.     citalopram  (CELEXA ) 40 MG tablet Take 1 tablet (40 mg total) by mouth daily. (Patient taking differently: Take 20 mg by mouth daily.) 90 tablet 2   ELIQUIS  5 MG TABS tablet TAKE 1 TABLET BY MOUTH  TWICE DAILY 180 tablet 3   furosemide  (LASIX ) 20 MG tablet Take 10 mg by mouth daily.     LORazepam  (ATIVAN ) 0.5 MG tablet Take 0.25 mg by mouth 2 (two) times daily.     potassium chloride  SA (KLOR-CON ) 20 MEQ tablet Take 20 mEq by mouth 2 (two) times a week. Monday & Friday     propranolol  ER (INDERAL  LA) 160 MG SR capsule Take 160 mg by mouth daily. Do not give if systolic blood pressure is less than 100 or HR less than 60     senna-docusate  (SENNA S) 8.6-50 MG tablet Take 2 tablets by mouth every other day.     tamsulosin  (FLOMAX ) 0.4 MG CAPS capsule Take 0.4 mg by mouth 2 (two) times daily.     White Petrolatum (VASELINE EX) Apply 1 Application topically at bedtime.     DEXTRAN 70-HYPROMELLOSE, PF, OP Place 1 drop into both eyes as needed (Dry Eye). (Patient not taking: Reported on 02/16/2024)     Menthol, Topical Analgesic, (BIOFREEZE COOL THE PAIN) 4 % GEL Apply 1 Application topically daily as needed (Pain). (Patient not taking: Reported on 02/16/2024)     nitroGLYCERIN  (NITROSTAT ) 0.4 MG SL tablet Place 1 tablet (0.4 mg total) under the tongue every 5 (five) minutes as needed for chest pain. (Patient not taking: Reported on 02/16/2024) 25 tablet 3   No current facility-administered medications for this visit.    Allergies:   Erythromycin, Gabapentin , and Fluorouracil    Social History:  The patient  reports that he quit smoking about 53 years ago. His smoking use included cigarettes. He has never used smokeless tobacco. He reports that he does not drink alcohol  and does not use drugs.   Family History:  The patient's family history includes Cancer in his brother and father.    ROS:  Please see the history of present illness.    All other systems are reviewed and are negative.   PHYSICAL EXAM: VS:  BP (!) 120/58 (BP Location: Left Arm, Patient Position: Sitting, Cuff Size: Normal)   Pulse 95   Ht 5' 5 (1.651 m)   Wt 177 lb 6.4 oz (80.5 kg)   SpO2 98%   BMI 29.52 kg/m  , BMI Body mass index is 29.52 kg/m.  General: Alert, oriented x3, no distress, appears elderly and frail.  Healthy pacemaker site. Head: no evidence of trauma, PERRL, EOMI, no exophtalmos or lid lag, no myxedema, no xanthelasma; normal ears, nose and oropharynx Neck: normal jugular venous pulsations and no hepatojugular reflux; brisk carotid pulses without delay and no carotid bruits Chest: clear to auscultation, no signs of consolidation by percussion  or palpation, normal fremitus, symmetrical and full respiratory excursions Cardiovascular: normal position and quality of the apical impulse, regular rhythm, normal first and second heart sounds, no murmurs, rubs or gallops Abdomen: no tenderness or distention, no masses by palpation, no abnormal pulsatility or arterial bruits, normal bowel sounds, no hepatosplenomegaly Extremities: no clubbing, cyanosis or edema; 2+ radial, ulnar and brachial pulses bilaterally; 2+ right femoral, posterior tibial and dorsalis pedis pulses; 2+ left femoral, posterior  tibial and dorsalis pedis pulses; no subclavian or femoral bruits Neurological: grossly nonfocal Psych: Normal mood and affect   EKG:  EKG is not ordered today.  The intracardiac electrogram shows AV sequential pacing.  The ECG from 08/23/2020 shows atrial sensed/atrial paced beats with ventricular pacing.  The paced QRS is relatively narrow, consistent with His bundle pacing.  Recent Labs: 08/02/2023: B Natriuretic Peptide 1,637.8 11/10/2023: ALT 22 11/11/2023: BUN 10; Creatinine, Ser 0.86; Hemoglobin 13.9; Platelets 161; Potassium 4.5; Sodium 138; TSH 0.954    Lipid Panel    Component Value Date/Time   CHOL 104 11/11/2023 0634   CHOL 117 08/21/2018 0943   TRIG 70 11/11/2023 0634   HDL 38 (L) 11/11/2023 0634   HDL 54 08/21/2018 0943   CHOLHDL 2.7 11/11/2023 0634   VLDL 14 11/11/2023 0634   LDLCALC 52 11/11/2023 0634   LDLCALC 68 05/28/2020 1546      Wt Readings from Last 3 Encounters:  02/16/24 177 lb 6.4 oz (80.5 kg)  12/13/23 176 lb 9.6 oz (80.1 kg)  11/10/23 179 lb 0.2 oz (81.2 kg)      ASSESSMENT AND PLAN:  1. Chronic combined systolic and diastolic heart failure (HCC)   2. Coronary artery disease of bypass graft of native heart with stable angina pectoris (HCC)   3. Paroxysmal atrial flutter (HCC)   4. Dyslipidemia (high LDL; low HDL)   5. Complete heart block (HCC)   6. Pacemaker   7. Cerebral infarction due to occlusion  of left carotid artery (HCC)   8. Occlusion of right vertebral artery   9. Essential hypertension   10. Orthostatic hypotension      1. CHF: Appears well compensated and clinically euvolemic, albeit with a very sedentary lifestyle.  He has been low diastolic blood pressure really does not allow addition of heart failure medications, especially since he is known to have problems with severe orthostatic hypotension in the past.  Conservative management with dietary sodium restriction and a judicious dose of loop diuretics appears to be the best course of action. 2. CAD s/p CABG: Asymptomatic.  Has not had angina pectoris in many years, on beta-blocker therapy.  Most recent imaging studies suggest that he may indeed have completely lost vascular supply to the inferior wall due to occlusion of the SVG to the PDA/PLA.,  Conservative management seems to be the best approach. 3. AFib: On Eliquis .  Overall burden of arrhythmia is low.  CHA2DS2-VASc 5 (age 11, CAD/PAD, CHF, HTN).  Thankfully he has not had any severe injuries or bleeding problems. 4. HLP: Most recent lipid profile is excellent with LDL cholesterol of 52. 5. 3rd deg AVB: He has progressed to complete heart block and is pacemaker dependent.  He also has essentially 100% atrial pacing. 6. PPM:  septal His bundle ventricular lead.  Normal device function.  Adjusted the ventricular lead output today.  Anticipate need for generator change in roughly 1 year.  Continue remote downloads every 3 months for now. 7.  Left internal carotid stenosis/occluded right vertebral artery, chronic: Recently he had left-sided retinal artery occlusion as well as tiny scattered infarcts throughout the left hemispheres.  He was not recently in atrial fibrillation and has been on Eliquis  anticoagulation.  He was scheduled for carotid endarterectomy but then the procedure was canceled at his request.  He has a repeat ultrasound and follow-up with Dr. Medford Gaskins scheduled  in November. 8. HTN: Diastolic blood pressure is actually borderline low.  Known to have problems  with orthostatic hypotension.   No orders of the defined types were placed in this encounter.   Patient Instructions  Medication Instructions:  No changes *If you need a refill on your cardiac medications before your next appointment, please call your pharmacy*  Lab Work: None ordered If you have labs (blood work) drawn today and your tests are completely normal, you will receive your results only by: MyChart Message (if you have MyChart) OR A paper copy in the mail If you have any lab test that is abnormal or we need to change your treatment, we will call you to review the results.  Testing/Procedures: None ordered  Follow-Up: At [facility], you and your health needs are our priority.  As part of our continuing mission to provide you with exceptional heart care, our providers are all part of one team.  This team includes your primary Cardiologist (physician) and Advanced Practice Providers or APPs (Physician Assistants and Nurse Practitioners) who all work together to provide you with the care you need, when you need it.  Your next appointment:    Come back in May  Provider:   Jerel Balding, MD    We recommend signing up for the patient portal called MyChart.  Sign up information is provided on this After Visit Summary.  MyChart is used to connect with patients for Virtual Visits (Telemedicine).  Patients are able to view lab/test results, encounter notes, upcoming appointments, etc.  Non-urgent messages can be sent to your provider as well.   To learn more about what you can do with MyChart, go to [email or web address].        Signed, Paul Balding, MD  02/16/2024 2:43 PM    Paul Balding, MD, [facility] [phone] office [phone] pager

## 2024-02-16 NOTE — Patient Instructions (Signed)
 Medication Instructions:  No changes *If you need a refill on your cardiac medications before your next appointment, please call your pharmacy*  Lab Work: None ordered If you have labs (blood work) drawn today and your tests are completely normal, you will receive your results only by: MyChart Message (if you have MyChart) OR A paper copy in the mail If you have any lab test that is abnormal or we need to change your treatment, we will call you to review the results.  Testing/Procedures: None ordered  Follow-Up: At [facility], you and your health needs are our priority.  As part of our continuing mission to provide you with exceptional heart care, our providers are all part of one team.  This team includes your primary Cardiologist (physician) and Advanced Practice Providers or APPs (Physician Assistants and Nurse Practitioners) who all work together to provide you with the care you need, when you need it.  Your next appointment:    Come back in May  Provider:   Jerel Balding, MD    We recommend signing up for the patient portal called MyChart.  Sign up information is provided on this After Visit Summary.  MyChart is used to connect with patients for Virtual Visits (Telemedicine).  Patients are able to view lab/test results, encounter notes, upcoming appointments, etc.  Non-urgent messages can be sent to your provider as well.   To learn more about what you can do with MyChart, go to [email or web address].

## 2024-02-17 DIAGNOSIS — R2689 Other abnormalities of gait and mobility: Secondary | ICD-10-CM | POA: Diagnosis not present

## 2024-02-17 DIAGNOSIS — M5459 Other low back pain: Secondary | ICD-10-CM | POA: Diagnosis not present

## 2024-02-17 DIAGNOSIS — M6281 Muscle weakness (generalized): Secondary | ICD-10-CM | POA: Diagnosis not present

## 2024-02-20 DIAGNOSIS — R2689 Other abnormalities of gait and mobility: Secondary | ICD-10-CM | POA: Diagnosis not present

## 2024-02-20 DIAGNOSIS — M6281 Muscle weakness (generalized): Secondary | ICD-10-CM | POA: Diagnosis not present

## 2024-02-20 DIAGNOSIS — M5459 Other low back pain: Secondary | ICD-10-CM | POA: Diagnosis not present

## 2024-02-21 DIAGNOSIS — M6281 Muscle weakness (generalized): Secondary | ICD-10-CM | POA: Diagnosis not present

## 2024-02-21 DIAGNOSIS — M5459 Other low back pain: Secondary | ICD-10-CM | POA: Diagnosis not present

## 2024-02-21 DIAGNOSIS — R2689 Other abnormalities of gait and mobility: Secondary | ICD-10-CM | POA: Diagnosis not present

## 2024-02-22 DIAGNOSIS — M6281 Muscle weakness (generalized): Secondary | ICD-10-CM | POA: Diagnosis not present

## 2024-02-22 DIAGNOSIS — R2689 Other abnormalities of gait and mobility: Secondary | ICD-10-CM | POA: Diagnosis not present

## 2024-02-22 DIAGNOSIS — M5459 Other low back pain: Secondary | ICD-10-CM | POA: Diagnosis not present

## 2024-02-23 DIAGNOSIS — M6258 Muscle wasting and atrophy, not elsewhere classified, other site: Secondary | ICD-10-CM | POA: Diagnosis not present

## 2024-02-23 DIAGNOSIS — M5459 Other low back pain: Secondary | ICD-10-CM | POA: Diagnosis not present

## 2024-02-23 DIAGNOSIS — M6281 Muscle weakness (generalized): Secondary | ICD-10-CM | POA: Diagnosis not present

## 2024-02-23 DIAGNOSIS — R2689 Other abnormalities of gait and mobility: Secondary | ICD-10-CM | POA: Diagnosis not present

## 2024-02-24 DIAGNOSIS — R2689 Other abnormalities of gait and mobility: Secondary | ICD-10-CM | POA: Diagnosis not present

## 2024-02-24 DIAGNOSIS — M6281 Muscle weakness (generalized): Secondary | ICD-10-CM | POA: Diagnosis not present

## 2024-02-24 DIAGNOSIS — M5459 Other low back pain: Secondary | ICD-10-CM | POA: Diagnosis not present

## 2024-02-24 DIAGNOSIS — M6258 Muscle wasting and atrophy, not elsewhere classified, other site: Secondary | ICD-10-CM | POA: Diagnosis not present

## 2024-02-28 NOTE — Progress Notes (Signed)
 Remote pacemaker transmission.

## 2024-02-28 NOTE — Addendum Note (Signed)
 Addended by: VICCI SELLER A on: 02/28/2024 11:42 AM   Modules accepted: Orders

## 2024-03-23 DIAGNOSIS — Z125 Encounter for screening for malignant neoplasm of prostate: Secondary | ICD-10-CM | POA: Diagnosis not present

## 2024-03-23 DIAGNOSIS — Z23 Encounter for immunization: Secondary | ICD-10-CM | POA: Diagnosis not present

## 2024-03-23 DIAGNOSIS — I2510 Atherosclerotic heart disease of native coronary artery without angina pectoris: Secondary | ICD-10-CM | POA: Diagnosis not present

## 2024-03-23 DIAGNOSIS — Z0000 Encounter for general adult medical examination without abnormal findings: Secondary | ICD-10-CM | POA: Diagnosis not present

## 2024-03-23 DIAGNOSIS — G629 Polyneuropathy, unspecified: Secondary | ICD-10-CM | POA: Diagnosis not present

## 2024-03-23 DIAGNOSIS — I639 Cerebral infarction, unspecified: Secondary | ICD-10-CM | POA: Diagnosis not present

## 2024-03-26 DIAGNOSIS — F411 Generalized anxiety disorder: Secondary | ICD-10-CM | POA: Diagnosis not present

## 2024-03-26 DIAGNOSIS — F331 Major depressive disorder, recurrent, moderate: Secondary | ICD-10-CM | POA: Diagnosis not present

## 2024-03-27 ENCOUNTER — Encounter: Payer: Self-pay | Admitting: Neurology

## 2024-03-27 ENCOUNTER — Ambulatory Visit (INDEPENDENT_AMBULATORY_CARE_PROVIDER_SITE_OTHER): Admitting: Neurology

## 2024-03-27 ENCOUNTER — Ambulatory Visit (INDEPENDENT_AMBULATORY_CARE_PROVIDER_SITE_OTHER): Payer: Self-pay

## 2024-03-27 VITALS — BP 128/61 | HR 66 | Ht 65.0 in | Wt 180.0 lb

## 2024-03-27 DIAGNOSIS — R471 Dysarthria and anarthria: Secondary | ICD-10-CM | POA: Diagnosis not present

## 2024-03-27 DIAGNOSIS — Z9289 Personal history of other medical treatment: Secondary | ICD-10-CM

## 2024-03-27 DIAGNOSIS — I442 Atrioventricular block, complete: Secondary | ICD-10-CM

## 2024-03-27 DIAGNOSIS — I5042 Chronic combined systolic (congestive) and diastolic (congestive) heart failure: Secondary | ICD-10-CM | POA: Diagnosis not present

## 2024-03-27 DIAGNOSIS — Z8673 Personal history of transient ischemic attack (TIA), and cerebral infarction without residual deficits: Secondary | ICD-10-CM | POA: Diagnosis not present

## 2024-03-27 DIAGNOSIS — Z9181 History of falling: Secondary | ICD-10-CM

## 2024-03-27 NOTE — Patient Instructions (Addendum)
 Please use your walker at all times.  Please follow-up in 6 to 8 months to see one of our nurse practitioners, you had extensive workup in the hospital and we do not need to add any testing from my things at this time.  Please continue your rehab through your facility and your current medications.

## 2024-03-27 NOTE — Progress Notes (Signed)
 Subjective:    Patient ID: Paul Frazier. is a 89 y.o. male.  HPI    True Mar, MD, PhD [facility] [address], [address] [address] [place], [place] [address]  I saw patient, Paul Frazier, as a referral from the hospital for evaluation of his recent stroke and hospitalization for it.  The patient is unaccompanied today and came via transportation.  He is currently residing at [facility] and rehab and skilled nursing.  He brought his 4-wheeled walker with seat.  Paul Frazier is an 89 year old male with an underlying complex medical history of coronary artery disease with history of MI, status post stent placement, status post CABG x 5, status post pacemaker placement, history of A-fib, on Eliquis , on propranolol , hypertension, hyperlipidemia, kidney stones, arthritis, tremor, memory loss, history of stroke in the past and recent stroke, and central retinal artery occlusion of the left eye, who reports feeling stable, he does not provide much in the way of details of his history, reports that he gets therapy at his rehab facility, but not every day.  He reports that he fell in June.  He reports that he required stitches.  I did not see any hospital encounter in June for head injury and stitches.   He is followed by cardiology.  He has seen vascular surgery in May. L CEA was discussed, but patient declined surgery.  He was advised to start a baby aspirin  and continue with Eliquis , he was advised to have a follow-up carotid Doppler study in about 6 months.   He reports intermittent numbness in the right hand, he reports a history of carpal tunnel syndrome and he sleeps with the brace.  He reports numbness in both feet.  Of note, he was hospitalized in April 2025.  He was admitted on 11/10/2023 and discharged on 11/11/2023 with a discharge diagnosis of central retinal artery occlusion of the left eye and acute left occipital pole cortical infarct as well as left corona  radiata and left hippocampus strokes.  I reviewed the hospital records.  He had a CT angiogram of the head and neck with and without contrast on 11/10/2023 and I reviewed the results:   IMPRESSION: 1. No intracranial arterial occlusion. 2. Occlusion of the right vertebral artery V1 and proximal V2 segments. 3. 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 Approximately 60% stenosis of the proximal left ICA due to predominantly noncalcified plaque. 5. Severe stenosis of the proximal right external carotid artery. 6. Thyromegaly.   Aortic Atherosclerosis (ICD10-I70.0).  He had a brain MRI without contrast on 11/10/2023 and I reviewed the results:   IMPRESSION: 1. Acute punctate nonhemorrhagic cortical infarct of the left occipital pole. 2. Acute punctate nonhemorrhagic infarct of the posterior left corona radiata. 3. Acute 5 mm nonhemorrhagic infarct of the posterior left hippocampus. 4. A 9 mm subcortical posterior parietal infarct. 5. Slight progression of periventricular and scattered subcortical T2 hyperintensities bilaterally. This likely reflects the sequela of chronic microvascular ischemia. 6. Remote cortical infarct of the left parietal lobe is new from the prior MRI but not acute. 7. Remote lacunar infarcts of the cerebellum are new since the prior exam, but not acute.  He had an MRI of the orbits with and without contrast on 11/11/2023 and I reviewed the results:   IMPRESSION: 1. Bilateral lens replacements. 2. Otherwise normal MRI appearance of the orbits. No acute or focal Lesion to explain the patient's symptoms.   He had a carotid Doppler  ultrasound on 11/11/2023 and I reviewed the results: Velocities in the right ICA are consistent with 1 to 39% stenosis.  The ECA appears more than 50% stenosed.  Velocities in the left ICA are consistent with a 40 to 59% stenosis.  Bilateral vertebral arteries demonstrate antegrade flow.  Normal flow hemodynamics were seen in  bilateral subclavian arteries.  He had an echocardiogram on 11/11/2023 and I reviewed the results: EF was 30 to 35%, left ventricle had moderately decreased function, there was inferior akinesis.  Grade 2 diastolic dysfunction was noted, left ventricular internal cavity size was mildly dilated.  His right ventricular systolic function was mildly reduced.  Left atrial size was mildly dilated, right atrial size was mildly dilated.  There was trivial mitral valve regurgitation, no evidence of mitral stenosis, no aortic stenosis.  Neurology was consulted.  I reviewed the consultation note with Dr. Rosemarie. He was advised to continue with Eliquis  and propranolol  and continue with atorvastatin , LDL was below 70.  A1c was 6.0.  He was advised to have home health PT.   Previously (copied from previous notes for reference):    06/21/2017: 89 year old right-handed gentleman with an underlying medical history of vitamin D  deficiency, hypertension, heart disease, status post CABG and stent placement, prediabetes, hyperlipidemia, and elevated PSA, who presents for a new problem visit for memory loss. The patient is unaccompanied today. I last saw him on 12/20/2016, and which time he felt stable, he had 2 falls, maybe her around March 2018. He had seen orthopedics. He was told he had spinal stenosis and was advised to consider ESI but he did not want to pursue this. He was using hemp tablets, which he reported was helpful. I suggested as needed follow-up.   06/21/2017: He reports having noticed issues with his memory. He has noticed some forgetfulness. He feels, that at times he is distracted. He admits that he does not always drink enough water  and he does not exercise. He does most of the household chores. His mother had memory loss in her later years.    I saw him on 08/19/2016 for new problem visit, at which time he reported having had 2 falls in the past 6 months, was complaining of toe numbness in both feet. He has  had some injections in his knees which she felt were helpful. He had injured his right arm and elbow secondary to fall and also fell in the bathroom but thankfully had no head injuries or loss of consciousness. He felt that his tremor was a little worse in his hands. He had stress particularly regarding his wife's health. He was not always hydrating well enough. I felt he had multifactorial gait disorder and suggested we proceed with EMG nerve conduction testing as well as blood work. His blood test results were benign number called him with his test results. EMG and nerve conduction testing on 09/30/2016 showed: IMPRESSION:  Abnormal study demonstrating: - Findings suggest evidence for right greater than left lumbar (L5-S1) radiculopathies. This is due to abnormal bilateral motor nerve responses with normal sensory nerve responses, in the setting of abnormal right lumbar paraspinal and right gastrocnemius needle EMG findings.  - No evidence for large fiber sensory neuropathy.    We called him with his test results. Based on the test results I also recommended we proceed with a lumbar spine MRI. He was agreeable. He had a lumbar spine MRI without contrast on 10/26/2016, which I reviewed:  IMPRESSION:  Abnormal MRI lumbar spine (without) demonstrating:  1. At L3-4: disc bulging and facet hypertrophy with severe spinal stenosis and severe biforaminal stenosis  2. At L4-5: disc bulging and facet hypertrophy with moderate spinal stenosis and severe biforaminal stenosis 3. At L2-3: disc bulging and facet hypertrophy with moderate spinal stenosis and moderate biforaminal stenosis  4. Sacralization of L5 with transitional features. Correlate with plain film xrays if intervention or surgery is planned.  We called him with his test results.   I suggested referral to orthopedics and he agreed.    He had a head CT without contrast on 05/13/2015 which I reviewed: Interval resolution of left frontal subdural  hematoma, no acute findings, mild atrophy and mild chronic small vessel ischemic changes. Of note, he has a history of fall in 2013 and known history of subdural hematoma.   I saw him on 11/13/2013, at which time he reported that the tremor was fluctuating. He had experienced intermittent vertigo. I did not suggest any medication changes for fear of causing his pulse to drop with increase in beta blocker and his balance to get worse with a trial of primidone.   I saw him on 09/14/2012, at which time I kept him on low-dose metoprolol  as he already had a low heart rate and I did not start him on primidone for fear of exacerbating his dizziness and balance problems and sedation, given his advanced age.  I first met him on 08/24/2012, which time he reported that he fell in December and hit his head. He was already on low-dose beta blocker because of his heart and I did not suggest that he increase it because his heart rate was already in the 50s. We talked about adding another medication down the Road but I did not suggest that we start him just yet. I did suggest a head CT based on his history of fall. He had a head CT on 09/06/2012 which showed a left frontal chronic subdural hematoma with mild midline shift but because he had moderate frontal atrophy there was not a whole other midline shift. I suggested that we repeat his head CT and we called him with his test results. He had a repeat head CT on 10/05/2012: Chronic left frontal subdural hematoma. Mild mass effect upon underlying frontal lobe. Minimal 2 mm of left to right midline shift. Compared to the prior CT from 09/06/12, there is slight reduction in size of the subdural hematoma.  He and his wife noted that his tremors have become worse since he had a heart attack last year. He has a prior history of coronary artery bypass surgery in the 80s and also had a stent placed in 2001. He has not noticed much in the way of difficulty with his posture. He does  notice sometimes that his balance is not very good. Of note he has fallen recently before Christmas last year. He actually tripped over a piece of wire that was outside and landed on the ground which was cold and frozen at the time. He fell and hit his for head. He has not had a head CT. He reports no residual headaches are one-sided weakness, numbness, slurring of speech or droopy face. Nevertheless at the time he had a severe headache. He's had degenerative spine disease and has had epidural injections to his neck. He's had some numbness and tingling radiating to his left index finger. He has seen a neurosurgeon who suggested surgery but left it up to him to decide. He has had epidural injection had his  last injection in February 2012.  He has had some lightheadedness, and recently was told to stop his Prinivil. He has not fallen, but has had some close calls. He denies vertigo. Never had TIA or stroke symptoms, denying sudden onset of one sided weakness, numbness, tingling, slurring of speech or droopy face, hearing loss, tinnitus, diplopia or visual field cut or monocular loss of vision, and denies recurrent headaches.  His tremor affects both hands but is worse on the left. It is primarily a action tremor and his handwriting is impaired. 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 He was very active in his work and had to stop suddenly because of his heart attack. He felt even if he had not stop working because of his heart he would've stopped because of his trembling. He has not noticed a lower extremity tremor. He does not report any stiffness or drooling. He does not report any memory difficulties or other cognitive impairment. He tries to walk regularly and his wife has not noticed any changes in his gait.  He had a brother who passed away at age 54, from cancer in his lymph nodes and he had PD. He recalls no other FHx of tremors. He fell off a ladder in 2009 and had surgery.     His Past  Medical History Is Significant For: Past Medical History:  Diagnosis Date   Arthritis    CAD (coronary artery disease)    Cancer (HCC)    Hyperlipidemia    Hypertension    Kidney stones    Old myocardial infarct    Right inguinal hernia 04/19/2012   S/P CABG x 5 08/29/1984   LIMA to LAD,SVG to diagonal,SVG to OM,SVG to PDA and PLA   Tremor, essential     His Past Surgical History Is Significant For: Past Surgical History:  Procedure Laterality Date   A-FLUTTER ABLATION N/A 03/09/2018   Procedure: A-FLUTTER ABLATION;  Surgeon: Waddell Danelle ORN, MD;  Location: [facility] INVASIVE CV LAB;  Service: Cardiovascular;  Laterality: N/A;   CHOLECYSTECTOMY  10/1990   CORONARY ARTERY BYPASS GRAFT  1986   x5 LIMA to LAD,SVG to diagonal,SVG to OM,SVG to PDA and PLA   CORONARY STENT PLACEMENT     INGUINAL HERNIA REPAIR  04/25/2012   right- laparoscopic   PACEMAKER IMPLANT N/A 03/09/2018   Procedure: PACEMAKER IMPLANT;  Surgeon: Waddell Danelle ORN, MD;  Location: [facility] INVASIVE CV LAB;  Service: Cardiovascular;  Laterality: N/A;   R/P Myoview   12/27/2008   no ischemia   TEE WITHOUT CARDIOVERSION N/A 03/09/2018   Procedure: TRANSESOPHAGEAL ECHOCARDIOGRAM (TEE);  Surgeon: Okey Vina GAILS, MD;  Location: [facility] ENDOSCOPY;  Service: Cardiovascular;  Laterality: N/A;  Bubble Study   US  ECHOCARDIOGRAPHY  12/27/2008   concentric LVH,mild to mod MR, TR and pulmonary hypertension. EF 45-50%    His Family History Is Significant For: Family History  Problem Relation Age of Onset   Cancer Father        bone   Cancer Brother        lymphnode   Stroke Neg Hx    Neuropathy Neg Hx     His Social History Is Significant For: Social History   Socioeconomic History   Marital status: Widowed    Spouse name: [name]   Number of children: 3   Years of education: 14   Highest education level: Not on file  Occupational History   Occupation: Retired     Comment: retired  Tobacco Use  Smoking status: Former    Current  packs/day: 0.00    Types: Cigarettes    Quit date: 07/19/1970    Years since quitting: 53.7   Smokeless tobacco: Never  Vaping Use   Vaping status: Never Used  Substance and Sexual Activity   Alcohol  use: No    Alcohol /week: 0.0 standard drinks of alcohol    Drug use: No   Sexual activity: Not Currently  Other Topics Concern   Not on file  Social History Narrative   ** Merged History Encounter ** Drinks 1-2 cups of coffee a day, occasional coke       Pt resides at [facility]    Retired    Social Drivers of [employer] Strain: Medium Risk (03/07/2018)   Overall Financial Resource Strain (CARDIA)    Difficulty of Paying Living Expenses: Somewhat hard  Food Insecurity: No Food Insecurity (11/10/2023)   Hunger Vital Sign    Worried About Running Out of Food in the Last Year: Never true    Ran Out of Food in the Last Year: Never true  Transportation Needs: No Transportation Needs (11/10/2023)   PRAPARE - Administrator, Civil Service (Medical): No    Lack of Transportation (Non-Medical): No  Physical Activity: Inactive (03/07/2018)   Exercise Vital Sign    Days of Exercise per Week: 0 days    Minutes of Exercise per Session: 0 min  Stress: Stress Concern Present (03/07/2018)   [employer] of Occupational Health - Occupational Stress Questionnaire    Feeling of Stress : To some extent  Social Connections: Moderately Integrated (11/10/2023)   Social Connection and Isolation Panel    Frequency of Communication with Friends and Family: More than three times a week    Frequency of Social Gatherings with Friends and Family: Twice a week    Attends Religious Services: More than 4 times per year    Active Member of [employer] or Organizations: No    Attends Banker Meetings: Never    Marital Status: Married    His Allergies Are:  Allergies  Allergen Reactions   Erythromycin Nausea Only   Gabapentin  Other (See Comments)   Fluorouracil Rash  and Other (See Comments)    Mouth ulcers  :   His Current Medications Are:  Outpatient Encounter Medications as of 03/27/2024  Medication Sig   acetaminophen  (TYLENOL ) 500 MG tablet Take 500 mg by mouth 2 (two) times daily as needed for moderate pain (pain score 4-6).   apixaban  (ELIQUIS ) 5 MG TABS tablet as directed Orally twice a day   aspirin  81 MG chewable tablet 1 tablet Orally Once a day   atorvastatin  (LIPITOR) 40 MG tablet Take 20 mg by mouth at bedtime.   Azelastine HCl 137 MCG/SPRAY SOLN Place 1 spray into both nostrils at bedtime.   cetirizine (ZYRTEC ALLERGY) 10 MG tablet Take 10 mg by mouth at bedtime.   Cholecalciferol  125 MCG (5000 UT) TABS 1 tablet Orally Once a day   citalopram  (CELEXA ) 20 MG tablet Take 20 mg by mouth daily.   ELIQUIS  5 MG TABS tablet TAKE 1 TABLET BY MOUTH  TWICE DAILY   furosemide  (LASIX ) 20 MG tablet Take 10 mg by mouth daily.   LORazepam  (ATIVAN ) 0.5 MG tablet Take 0.25 mg by mouth 2 (two) times daily.   nitroGLYCERIN  (NITROSTAT ) 0.4 MG SL tablet Place 1 tablet (0.4 mg total) under the tongue every 5 (five) minutes as needed for chest pain.  Potassium Chloride  ER 20 MEQ TBCR 1 tablet with food Orally twice a week   potassium chloride  SA (KLOR-CON ) 20 MEQ tablet Take 20 mEq by mouth 2 (two) times a week. Monday & Friday   propranolol  ER (INDERAL  LA) 160 MG SR capsule Take 160 mg by mouth daily. Do not give if systolic blood pressure is less than 100 or HR less than 60   Propylene Glycol-Glycerin 1-0.3 % SOLN as directed Ophthalmic twice a day   senna-docusate (SENNA S) 8.6-50 MG tablet Take 2 tablets by mouth every other day.   tamsulosin  (FLOMAX ) 0.4 MG CAPS capsule Take 0.4 mg by mouth 2 (two) times daily.   White Petrolatum (VASELINE EX) Apply 1 Application topically at bedtime.   atorvastatin  (LIPITOR) 20 MG tablet Take 20 mg by mouth daily. (Patient not taking: Reported on 03/27/2024)   DEXTRAN 70-HYPROMELLOSE, PF, OP Place 1 drop into both eyes as  needed (Dry Eye). (Patient not taking: Reported on 03/27/2024)   Menthol, Topical Analgesic, (BIOFREEZE COOL THE PAIN) 4 % GEL Apply 1 Application topically daily as needed (Pain). (Patient not taking: Reported on 03/27/2024)   [DISCONTINUED] citalopram  (CELEXA ) 40 MG tablet Take 1 tablet (40 mg total) by mouth daily. (Patient taking differently: Take 20 mg by mouth daily.)   No facility-administered encounter medications on file as of 03/27/2024.  :   Review of Systems:  Out of a complete 14 point review of systems, all are reviewed and negative with the exception of these symptoms as listed below:   Review of Systems  Neurological:        Pt here for stroke f/u  Pt states didn't have a stroke  pt states numbness in both feet and right hand     Objective:  Neurological Exam  Physical Exam Physical Examination:   Vitals:   03/27/24 1307  BP: 128/61  Pulse: 66    General Examination: The patient is in no acute distress, he appears frail, well-groomed.    HEENT: Normocephalic, atraumatic, pupils are equal, round and reactive to light, status post cataract repairs. Extraocular tracking is fairly good, no nystagmus is noted. Hearing is grossly intact. Speech shows dysarthria.face is symmetric, no obvious facial droop.  Airway examination reveals moderate mouth dryness, tongue protrudes centrally and palate elevates symmetrically.  No carotid bruits.     Chest: Clear to auscultation without wheezing, rhonchi or crackles noted.   Heart: S1+S2+0, slightly irregular, split second heart sound.     Abdomen: Soft, non-tender and non-distended with normal bowel sounds appreciated on auscultation.   Extremities: There is trace edema in the distal lower extremities bilaterally.    Skin: Warm and dry with chronic appearing bruising noted in the forearms and distal legs   Musculoskeletal: exam reveals chronic arthritic changes in both hands.    Neurologically:  Mental status: The patient is  awake, alert and pays attention.  He does not give very many details to his history.  History is primarily extracted from his electronic chart.    (On 06/21/2017: MMSE 29/30 (missed one on delayed recall), CDT: 4/4, AFT: 17/min.)    Cranial nerves II - XII are as described above under HEENT exam.  Motor exam: Thin bulk, global strength of 4 out of 5, slightly weaker grip strength bilaterally, intermittent mild tremor noted in both upper extremity with posture and action.  Reflexes trace.  Absent in the ankles.   Romberg is not tested due to safety concerns.   Fine motor skills are  globally impaired, no lateralization noted.   Sensory exam: intact to light touch.    Gait, station and balance: He stands with difficulty and pushes himself up.  He stands wider base.  He uses a 4 wheeled walker with seat, maneuvers to walker fairly well, posture is stooped but may be okay for age.     Assessment and Plan:    In summary, Paul Frazier is an 89 year old male with an underlying complex and complicated medical history of coronary artery disease with history of MI, status post stent placement, status post CABG x 5, status post pacemaker placement, history of A-fib, on Eliquis , on propranolol , hypertension, hyperlipidemia, kidney stones, arthritis, tremor, memory loss, history of stroke in the past and recent stroke, and central retinal artery occlusion of the left eye, who presents for evaluation after a recent hospitalization for stroke in April 2025.  He had extensive workup in the hospital and we talked about stroke secondary prevention at length today.  We talked about the importance of fall prevention and maintaining a healthy lifestyle.  He is advised to continue with the current medication regimen and care through his rehab facility.  He is advised to stay well-hydrated and use his walker at all times.  He is advised to follow-up for a recheck with one of our nurse practitioners in about 6 to 8 months,  sooner if needed.  I did not suggest any new medications and he has had stroke workup in the hospital.  This was an extended visit of over 60 minutes with copious record review involved including hospital records and outpatient visit records, as well as considerable counseling and coordination of care.  I answered all his questions today and he was in agreement with our plan.

## 2024-03-28 LAB — CUP PACEART REMOTE DEVICE CHECK
Battery Remaining Longevity: 17 mo
Battery Voltage: 2.83 V
Brady Statistic AP VP Percent: 98.12 %
Brady Statistic AP VS Percent: 0.02 %
Brady Statistic AS VP Percent: 0.41 %
Brady Statistic AS VS Percent: 1.45 %
Brady Statistic RA Percent Paced: 99.47 %
Brady Statistic RV Percent Paced: 98.52 %
Date Time Interrogation Session: 20250909013503
Implantable Lead Connection Status: 753985
Implantable Lead Connection Status: 753985
Implantable Lead Implant Date: 20190822
Implantable Lead Implant Date: 20190822
Implantable Lead Location: 753859
Implantable Lead Location: 753860
Implantable Lead Model: 3830
Implantable Lead Model: 5076
Implantable Pulse Generator Implant Date: 20190822
Lead Channel Impedance Value: 304 Ohm
Lead Channel Impedance Value: 304 Ohm
Lead Channel Impedance Value: 361 Ohm
Lead Channel Impedance Value: 418 Ohm
Lead Channel Pacing Threshold Amplitude: 0.75 V
Lead Channel Pacing Threshold Amplitude: 1.625 V
Lead Channel Pacing Threshold Pulse Width: 0.4 ms
Lead Channel Pacing Threshold Pulse Width: 0.4 ms
Lead Channel Sensing Intrinsic Amplitude: 3.375 mV
Lead Channel Sensing Intrinsic Amplitude: 3.375 mV
Lead Channel Sensing Intrinsic Amplitude: 5.375 mV
Lead Channel Sensing Intrinsic Amplitude: 5.375 mV
Lead Channel Setting Pacing Amplitude: 1.5 V
Lead Channel Setting Pacing Amplitude: 2 V
Lead Channel Setting Pacing Pulse Width: 0.6 ms
Lead Channel Setting Sensing Sensitivity: 2.8 mV
Zone Setting Status: 755011
Zone Setting Status: 755011

## 2024-03-30 DIAGNOSIS — W19XXXA Unspecified fall, initial encounter: Secondary | ICD-10-CM | POA: Diagnosis not present

## 2024-03-30 DIAGNOSIS — I5040 Unspecified combined systolic (congestive) and diastolic (congestive) heart failure: Secondary | ICD-10-CM | POA: Diagnosis not present

## 2024-03-30 DIAGNOSIS — G629 Polyneuropathy, unspecified: Secondary | ICD-10-CM | POA: Diagnosis not present

## 2024-03-30 DIAGNOSIS — G250 Essential tremor: Secondary | ICD-10-CM | POA: Diagnosis not present

## 2024-04-03 ENCOUNTER — Ambulatory Visit: Payer: Self-pay | Admitting: Internal Medicine

## 2024-04-03 DIAGNOSIS — K080 Exfoliation of teeth due to systemic causes: Secondary | ICD-10-CM | POA: Diagnosis not present

## 2024-04-06 NOTE — Progress Notes (Signed)
 Remote PPM Transmission

## 2024-04-16 DIAGNOSIS — M79651 Pain in right thigh: Secondary | ICD-10-CM | POA: Diagnosis not present

## 2024-04-16 DIAGNOSIS — G8929 Other chronic pain: Secondary | ICD-10-CM | POA: Diagnosis not present

## 2024-04-16 DIAGNOSIS — F331 Major depressive disorder, recurrent, moderate: Secondary | ICD-10-CM | POA: Diagnosis not present

## 2024-04-16 DIAGNOSIS — F411 Generalized anxiety disorder: Secondary | ICD-10-CM | POA: Diagnosis not present

## 2024-04-16 DIAGNOSIS — N401 Enlarged prostate with lower urinary tract symptoms: Secondary | ICD-10-CM | POA: Diagnosis not present

## 2024-04-16 DIAGNOSIS — R319 Hematuria, unspecified: Secondary | ICD-10-CM | POA: Diagnosis not present

## 2024-04-17 DIAGNOSIS — R310 Gross hematuria: Secondary | ICD-10-CM | POA: Diagnosis not present

## 2024-04-17 DIAGNOSIS — N200 Calculus of kidney: Secondary | ICD-10-CM | POA: Diagnosis not present

## 2024-04-17 DIAGNOSIS — N403 Nodular prostate with lower urinary tract symptoms: Secondary | ICD-10-CM | POA: Diagnosis not present

## 2024-04-17 DIAGNOSIS — R3912 Poor urinary stream: Secondary | ICD-10-CM | POA: Diagnosis not present

## 2024-04-20 DIAGNOSIS — L602 Onychogryphosis: Secondary | ICD-10-CM | POA: Diagnosis not present

## 2024-04-20 DIAGNOSIS — L603 Nail dystrophy: Secondary | ICD-10-CM | POA: Diagnosis not present

## 2024-04-25 DIAGNOSIS — K080 Exfoliation of teeth due to systemic causes: Secondary | ICD-10-CM | POA: Diagnosis not present

## 2024-05-16 DIAGNOSIS — H5203 Hypermetropia, bilateral: Secondary | ICD-10-CM | POA: Diagnosis not present

## 2024-05-21 DIAGNOSIS — N401 Enlarged prostate with lower urinary tract symptoms: Secondary | ICD-10-CM | POA: Diagnosis not present

## 2024-05-21 DIAGNOSIS — I2510 Atherosclerotic heart disease of native coronary artery without angina pectoris: Secondary | ICD-10-CM | POA: Diagnosis not present

## 2024-05-21 DIAGNOSIS — I5040 Unspecified combined systolic (congestive) and diastolic (congestive) heart failure: Secondary | ICD-10-CM | POA: Diagnosis not present

## 2024-05-21 DIAGNOSIS — M79651 Pain in right thigh: Secondary | ICD-10-CM | POA: Diagnosis not present

## 2024-05-30 DIAGNOSIS — L821 Other seborrheic keratosis: Secondary | ICD-10-CM | POA: Diagnosis not present

## 2024-05-30 DIAGNOSIS — D1801 Hemangioma of skin and subcutaneous tissue: Secondary | ICD-10-CM | POA: Diagnosis not present

## 2024-05-30 DIAGNOSIS — L814 Other melanin hyperpigmentation: Secondary | ICD-10-CM | POA: Diagnosis not present

## 2024-05-30 DIAGNOSIS — D485 Neoplasm of uncertain behavior of skin: Secondary | ICD-10-CM | POA: Diagnosis not present

## 2024-05-30 DIAGNOSIS — D0439 Carcinoma in situ of skin of other parts of face: Secondary | ICD-10-CM | POA: Diagnosis not present

## 2024-06-05 ENCOUNTER — Encounter (HOSPITAL_COMMUNITY)

## 2024-06-05 ENCOUNTER — Ambulatory Visit: Admitting: Vascular Surgery

## 2024-06-11 DIAGNOSIS — F331 Major depressive disorder, recurrent, moderate: Secondary | ICD-10-CM | POA: Diagnosis not present

## 2024-06-11 DIAGNOSIS — F411 Generalized anxiety disorder: Secondary | ICD-10-CM | POA: Diagnosis not present

## 2024-06-21 DIAGNOSIS — R310 Gross hematuria: Secondary | ICD-10-CM | POA: Diagnosis not present

## 2024-06-26 ENCOUNTER — Encounter: Payer: Self-pay | Admitting: Vascular Surgery

## 2024-06-26 ENCOUNTER — Ambulatory Visit (INDEPENDENT_AMBULATORY_CARE_PROVIDER_SITE_OTHER): Admitting: Vascular Surgery

## 2024-06-26 ENCOUNTER — Ambulatory Visit (HOSPITAL_COMMUNITY)
Admission: RE | Admit: 2024-06-26 | Discharge: 2024-06-26 | Disposition: A | Source: Ambulatory Visit | Attending: Vascular Surgery

## 2024-06-26 ENCOUNTER — Ambulatory Visit: Payer: Self-pay

## 2024-06-26 VITALS — BP 124/67 | HR 66 | Temp 98.1°F | Resp 20 | Ht 65.0 in | Wt 181.7 lb

## 2024-06-26 DIAGNOSIS — I6522 Occlusion and stenosis of left carotid artery: Secondary | ICD-10-CM | POA: Diagnosis not present

## 2024-06-26 DIAGNOSIS — I6523 Occlusion and stenosis of bilateral carotid arteries: Secondary | ICD-10-CM

## 2024-06-26 DIAGNOSIS — I442 Atrioventricular block, complete: Secondary | ICD-10-CM | POA: Diagnosis not present

## 2024-06-26 NOTE — Progress Notes (Signed)
 Patient name: Paul Frazier. MRN: [id number] DOB: 1932/09/05 Sex: male  REASON FOR CONSULT: 6 month follow-up, discuss carotid intervention  HPI: Daryle Amis. is a 89 y.o. male, with history of CHF (EF 30-35%) and A-fib on Eliquis  that presents for follow-up to discuss carotid surgery.  Previously seen on 11/11/2023 with a symptomatic left carotid stenosis of 60% with associated web.  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  Also had an MRI showing left brain infarct.  He was offered left carotid endarterectomy during hospitalization and patient canceled surgery and elected he did not want surgery.  Today states he is doing well and in his facility at [facility]  No neurologic events since last follow-up over the last 6 months.  Still has some blurry vision in his left eye.  On aspirin  Eliquis  statin.    Past Medical History:  Diagnosis Date   Arthritis    CAD (coronary artery disease)    Cancer (HCC)    Carotid artery occlusion    Hyperlipidemia    Hypertension    Kidney stones    Old myocardial infarct    Right inguinal hernia 04/19/2012   S/P CABG x 5 08/29/1984   LIMA to LAD,SVG to diagonal,SVG to OM,SVG to PDA and PLA   Tremor, essential     Past Surgical History:  Procedure Laterality Date   A-FLUTTER ABLATION N/A 03/09/2018   Procedure: A-FLUTTER ABLATION;  Surgeon: Waddell Danelle ORN, MD;  Location: [facility] INVASIVE CV LAB;  Service: Cardiovascular;  Laterality: N/A;   CHOLECYSTECTOMY  10/1990   CORONARY ARTERY BYPASS GRAFT  1986   x5 LIMA to LAD,SVG to diagonal,SVG to OM,SVG to PDA and PLA   CORONARY STENT PLACEMENT     INGUINAL HERNIA REPAIR  04/25/2012   right- laparoscopic   PACEMAKER IMPLANT N/A 03/09/2018   Procedure: PACEMAKER IMPLANT;  Surgeon: Waddell Danelle ORN, MD;  Location: [facility] INVASIVE CV LAB;  Service: Cardiovascular;  Laterality: N/A;   R/P Myoview   12/27/2008   no ischemia   TEE WITHOUT CARDIOVERSION N/A 03/09/2018    Procedure: TRANSESOPHAGEAL ECHOCARDIOGRAM (TEE);  Surgeon: Okey Vina GAILS, MD;  Location: [facility] ENDOSCOPY;  Service: Cardiovascular;  Laterality: N/A;  Bubble Study   US  ECHOCARDIOGRAPHY  12/27/2008   concentric LVH,mild to mod MR, TR and pulmonary hypertension. EF 45-50%    Family History  Problem Relation Age of Onset   Cancer Father        bone   Cancer Brother        lymphnode   Stroke Neg Hx    Neuropathy Neg Hx     SOCIAL HISTORY: Social History   Socioeconomic History   Marital status: Widowed    Spouse name: [name]   Number of children: 3   Years of education: 14   Highest education level: Not on file  Occupational History   Occupation: Retired     Comment: retired  Tobacco Use   Smoking status: Former    Current packs/day: 0.00    Types: Cigarettes    Quit date: 07/19/1970    Years since quitting: 53.9   Smokeless tobacco: Never  Vaping Use   Vaping status: Never Used  Substance and Sexual Activity   Alcohol  use: No    Alcohol /week: 0.0 standard drinks of alcohol    Drug use: No   Sexual activity: Not Currently  Other Topics Concern   Not on file  Social History Narrative   **  Merged History Encounter ** Drinks 1-2 cups of coffee a day, occasional coke       Pt resides at [facility]    Retired    Social Drivers of [employer]: Medium Risk (03/07/2018)   Overall Financial Resource Strain (CARDIA)    Difficulty of Paying Living Expenses: Somewhat hard  Food Insecurity: No Food Insecurity (11/10/2023)   Hunger Vital Sign    Worried About Running Out of Food in the Last Year: Never true    Ran Out of Food in the Last Year: Never true  Transportation Needs: No Transportation Needs (11/10/2023)   PRAPARE - Administrator, Civil Service (Medical): No    Lack of Transportation (Non-Medical): No  Physical Activity: Inactive (03/07/2018)   Exercise Vital Sign    Days of Exercise per Week: 0 days    Minutes of Exercise per  Session: 0 min  Stress: Stress Concern Present (03/07/2018)   [employer] of Occupational Health - Occupational Stress Questionnaire    Feeling of Stress : To some extent  Social Connections: Moderately Integrated (11/10/2023)   Social Connection and Isolation Panel    Frequency of Communication with Friends and Family: More than three times a week    Frequency of Social Gatherings with Friends and Family: Twice a week    Attends Religious Services: More than 4 times per year    Active Member of [employer] or Organizations: No    Attends Banker Meetings: Never    Marital Status: Married  Catering Manager Violence: Not At Risk (11/10/2023)   Humiliation, Afraid, Rape, and Kick questionnaire    Fear of Current or Ex-Partner: No    Emotionally Abused: No    Physically Abused: No    Sexually Abused: No    Allergies  Allergen Reactions   Erythromycin Nausea Only   Gabapentin  Other (See Comments)   Fluorouracil Rash and Other (See Comments)    Mouth ulcers    Current Outpatient Medications  Medication Sig Dispense Refill   acetaminophen  (TYLENOL ) 500 MG tablet Take 500 mg by mouth 2 (two) times daily as needed for moderate pain (pain score 4-6).     apixaban  (ELIQUIS ) 5 MG TABS tablet as directed Orally twice a day     aspirin  81 MG chewable tablet 1 tablet Orally Once a day     atorvastatin  (LIPITOR) 20 MG tablet Take 20 mg by mouth daily.     atorvastatin  (LIPITOR) 40 MG tablet Take 20 mg by mouth at bedtime.     Azelastine HCl 137 MCG/SPRAY SOLN Place 1 spray into both nostrils at bedtime.     cetirizine (ZYRTEC ALLERGY) 10 MG tablet Take 10 mg by mouth at bedtime.     Cholecalciferol  125 MCG (5000 UT) TABS 1 tablet Orally Once a day     citalopram  (CELEXA ) 20 MG tablet Take 20 mg by mouth daily.     ELIQUIS  5 MG TABS tablet TAKE 1 TABLET BY MOUTH  TWICE DAILY 180 tablet 3   furosemide  (LASIX ) 20 MG tablet Take 10 mg by mouth daily.     LORazepam  (ATIVAN ) 0.5 MG  tablet Take 0.25 mg by mouth 2 (two) times daily.     nitroGLYCERIN  (NITROSTAT ) 0.4 MG SL tablet Place 1 tablet (0.4 mg total) under the tongue every 5 (five) minutes as needed for chest pain. 25 tablet 3   Potassium Chloride  ER 20 MEQ TBCR 1 tablet with food Orally twice a  week     potassium chloride  SA (KLOR-CON ) 20 MEQ tablet Take 20 mEq by mouth 2 (two) times a week. Monday & Friday     propranolol  ER (INDERAL  LA) 160 MG SR capsule Take 160 mg by mouth daily. Do not give if systolic blood pressure is less than 100 or HR less than 60     Propylene Glycol-Glycerin 1-0.3 % SOLN as directed Ophthalmic twice a day     senna-docusate (SENNA S) 8.6-50 MG tablet Take 2 tablets by mouth every other day.     tamsulosin  (FLOMAX ) 0.4 MG CAPS capsule Take 0.4 mg by mouth 2 (two) times daily.     White Petrolatum (VASELINE EX) Apply 1 Application topically at bedtime.     No current facility-administered medications for this visit.    REVIEW OF SYSTEMS:  [X]  denotes positive finding, [ ]  denotes negative finding Cardiac  Comments:  Chest pain or chest pressure:    Shortness of breath upon exertion:    Short of breath when lying flat:    Irregular heart rhythm:        Vascular    Pain in calf, thigh, or hip brought on by ambulation:    Pain in feet at night that wakes you up from your sleep:     Blood clot in your veins:    Leg swelling:         Pulmonary    Oxygen  at home:    Productive cough:     Wheezing:         Neurologic    Sudden weakness in arms or legs:     Sudden numbness in arms or legs:     Sudden onset of difficulty speaking or slurred speech:    Temporary loss of vision in one eye:     Problems with dizziness:         Gastrointestinal    Blood in stool:     Vomited blood:         Genitourinary    Burning when urinating:     Blood in urine:        Psychiatric    Major depression:         Hematologic    Bleeding problems:    Problems with blood clotting too  easily:        Skin    Rashes or ulcers:        Constitutional    Fever or chills:      PHYSICAL EXAM: Vitals:   06/26/24 1600 06/26/24 1603  BP: 126/68 124/67  Pulse: 66   Resp: 20   Temp: 98.1 F (36.7 C)   TempSrc: Temporal   SpO2: 97%   Weight: 181 lb 11.2 oz (82.4 kg)   Height: 5' 5 (1.651 m)     GENERAL: The patient is a well-nourished male, in no acute distress. The vital signs are documented above. CARDIAC: There is a regular rate and rhythm.  PULMONARY: No respiratory distress. ABDOMEN: Soft and non-tender. MUSCULOSKELETAL: There are no major deformities or cyanosis. NEUROLOGIC: No focal weakness or paresthesias are detected.  Cranial nerves II through XII grossly intact.  SKIN: There are no ulcers or rashes noted. PSYCHIATRIC: The patient has a normal affect.  DATA:   Carotid duplex today shows 1 to 39% right ICA stenosis and 60 to 79% left ICA stenosis  CTA neck 11/10/2023 with 60% stenosis of the proximal left ICA with web  MRI brain 11/11/2023 with acute cortical  infarct of the left occipital, posterior left corona radiata, posterior left hippocampus  Assessment/Plan:   89 y.o. male, with history of CHF (EF 30-35%) and A-fib on Eliquis  that presents for 6 month follow-up to discuss carotid surgery and for ongoing surveillance.  Previously seen on 11/11/2023 with a symptomatic left carotid stenosis of 60% with associated web.  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  Also had an MRI showing left brain infarct.  He was offered left carotid endarterectomy during hospitalization and patient canceled surgery and elected he did not want surgery.  I again discussed indication for left carotid revascularization today and for stroke risk reduction.  We reviewed all the prior findings of his left eye stroke.  Neurology felt his left carotid web was the symptomatic lesion for his left brain and retinal stroke.  He again is against any  surgical intervention at this time.  I discussed his carotid duplex today shows 60 to 79% left ICA stenosis.  I recommended ongoing medical therapy with aspirin  in addition to his Eliquis  for A-fib and statin.  Encouraging that he has gone 6 months without another event as we discussed today while on medical therapy.  I will see him in 6 months with carotid duplex as he states if he had another stroke event he may change his mind about surgery.  Lonni DOROTHA Gaskins, MD [facility] Office: [phone]

## 2024-06-27 ENCOUNTER — Other Ambulatory Visit: Payer: Self-pay

## 2024-06-27 DIAGNOSIS — I6523 Occlusion and stenosis of bilateral carotid arteries: Secondary | ICD-10-CM

## 2024-06-28 ENCOUNTER — Ambulatory Visit: Payer: Self-pay | Admitting: Internal Medicine

## 2024-06-28 LAB — CUP PACEART REMOTE DEVICE CHECK
Battery Remaining Longevity: 16 mo
Battery Voltage: 2.81 V
Brady Statistic RA Percent Paced: 0 %
Brady Statistic RV Percent Paced: 98.64 %
Date Time Interrogation Session: 20251208233159
Implantable Lead Connection Status: 753985
Implantable Lead Connection Status: 753985
Implantable Lead Implant Date: 20190822
Implantable Lead Implant Date: 20190822
Implantable Lead Location: 753859
Implantable Lead Location: 753860
Implantable Lead Model: 3830
Implantable Lead Model: 5076
Implantable Pulse Generator Implant Date: 20190822
Lead Channel Impedance Value: 285 Ohm
Lead Channel Impedance Value: 304 Ohm
Lead Channel Impedance Value: 361 Ohm
Lead Channel Impedance Value: 418 Ohm
Lead Channel Pacing Threshold Amplitude: 0.75 V
Lead Channel Pacing Threshold Amplitude: 1.375 V
Lead Channel Pacing Threshold Pulse Width: 0.4 ms
Lead Channel Pacing Threshold Pulse Width: 0.4 ms
Lead Channel Sensing Intrinsic Amplitude: 1.5 mV
Lead Channel Sensing Intrinsic Amplitude: 1.5 mV
Lead Channel Sensing Intrinsic Amplitude: 3.75 mV
Lead Channel Sensing Intrinsic Amplitude: 3.75 mV
Lead Channel Setting Pacing Amplitude: 1.5 V
Lead Channel Setting Pacing Amplitude: 2 V
Lead Channel Setting Pacing Pulse Width: 0.6 ms
Lead Channel Setting Sensing Sensitivity: 2.8 mV
Zone Setting Status: 755011
Zone Setting Status: 755011

## 2024-07-02 DIAGNOSIS — D0439 Carcinoma in situ of skin of other parts of face: Secondary | ICD-10-CM | POA: Diagnosis not present

## 2024-07-04 NOTE — Progress Notes (Signed)
 Remote PPM Transmission

## 2024-08-23 ENCOUNTER — Other Ambulatory Visit (HOSPITAL_COMMUNITY): Payer: Self-pay | Admitting: Urology

## 2024-08-23 DIAGNOSIS — C61 Malignant neoplasm of prostate: Secondary | ICD-10-CM

## 2024-12-03 ENCOUNTER — Ambulatory Visit: Admitting: Adult Health

## 2024-12-25 ENCOUNTER — Ambulatory Visit: Admitting: Vascular Surgery

## 2024-12-25 ENCOUNTER — Ambulatory Visit (HOSPITAL_COMMUNITY)
